# Patient Record
Sex: Female | Born: 1940 | Race: White | Hispanic: No | Marital: Married | Smoking: Never smoker
Health system: Southern US, Community
[De-identification: ages and names within clinical notes are randomized; demographics above are authoritative.]

## PROBLEM LIST (undated history)

## (undated) DIAGNOSIS — K219 Gastro-esophageal reflux disease without esophagitis: Secondary | ICD-10-CM

## (undated) DIAGNOSIS — G4730 Sleep apnea, unspecified: Secondary | ICD-10-CM

## (undated) DIAGNOSIS — E785 Hyperlipidemia, unspecified: Secondary | ICD-10-CM

## (undated) DIAGNOSIS — F419 Anxiety disorder, unspecified: Secondary | ICD-10-CM

## (undated) DIAGNOSIS — D509 Iron deficiency anemia, unspecified: Secondary | ICD-10-CM

## (undated) DIAGNOSIS — K589 Irritable bowel syndrome without diarrhea: Secondary | ICD-10-CM

## (undated) DIAGNOSIS — K5790 Diverticulosis of intestine, part unspecified, without perforation or abscess without bleeding: Secondary | ICD-10-CM

## (undated) DIAGNOSIS — M1990 Unspecified osteoarthritis, unspecified site: Secondary | ICD-10-CM

## (undated) DIAGNOSIS — E119 Type 2 diabetes mellitus without complications: Secondary | ICD-10-CM

## (undated) DIAGNOSIS — Z87440 Personal history of urinary (tract) infections: Secondary | ICD-10-CM

## (undated) DIAGNOSIS — R600 Localized edema: Secondary | ICD-10-CM

## (undated) DIAGNOSIS — K449 Diaphragmatic hernia without obstruction or gangrene: Secondary | ICD-10-CM

## (undated) DIAGNOSIS — K2970 Gastritis, unspecified, without bleeding: Secondary | ICD-10-CM

## (undated) DIAGNOSIS — H269 Unspecified cataract: Secondary | ICD-10-CM

## (undated) DIAGNOSIS — F32A Depression, unspecified: Secondary | ICD-10-CM

## (undated) DIAGNOSIS — M8580 Other specified disorders of bone density and structure, unspecified site: Secondary | ICD-10-CM

## (undated) DIAGNOSIS — T7840XA Allergy, unspecified, initial encounter: Secondary | ICD-10-CM

## (undated) DIAGNOSIS — D126 Benign neoplasm of colon, unspecified: Secondary | ICD-10-CM

## (undated) DIAGNOSIS — Z9289 Personal history of other medical treatment: Secondary | ICD-10-CM

## (undated) DIAGNOSIS — F329 Major depressive disorder, single episode, unspecified: Secondary | ICD-10-CM

## (undated) DIAGNOSIS — G43909 Migraine, unspecified, not intractable, without status migrainosus: Secondary | ICD-10-CM

## (undated) HISTORY — DX: Gastritis, unspecified, without bleeding: K29.70

## (undated) HISTORY — PX: UPPER GI ENDOSCOPY: SHX6162

## (undated) HISTORY — PX: BILATERAL OOPHORECTOMY: SHX1221

## (undated) HISTORY — DX: Unspecified cataract: H26.9

## (undated) HISTORY — PX: TONSILLECTOMY: SUR1361

## (undated) HISTORY — DX: Irritable bowel syndrome, unspecified: K58.9

## (undated) HISTORY — DX: Hyperlipidemia, unspecified: E78.5

## (undated) HISTORY — DX: Diaphragmatic hernia without obstruction or gangrene: K44.9

## (undated) HISTORY — DX: Anxiety disorder, unspecified: F41.9

## (undated) HISTORY — DX: Allergy, unspecified, initial encounter: T78.40XA

## (undated) HISTORY — DX: Unspecified osteoarthritis, unspecified site: M19.90

## (undated) HISTORY — DX: Diverticulosis of intestine, part unspecified, without perforation or abscess without bleeding: K57.90

## (undated) HISTORY — PX: BREAST BIOPSY: SHX20

## (undated) HISTORY — DX: Gastro-esophageal reflux disease without esophagitis: K21.9

## (undated) HISTORY — DX: Benign neoplasm of colon, unspecified: D12.6

## (undated) HISTORY — DX: Sleep apnea, unspecified: G47.30

## (undated) HISTORY — PX: COLONOSCOPY: SHX174

## (undated) HISTORY — PX: PARS PLANA VITRECTOMY W/ REPAIR OF MACULAR HOLE: SHX2170

## (undated) HISTORY — DX: Depression, unspecified: F32.A

## (undated) HISTORY — DX: Major depressive disorder, single episode, unspecified: F32.9

## (undated) HISTORY — DX: Other specified disorders of bone density and structure, unspecified site: M85.80

## (undated) HISTORY — PX: CATARACT EXTRACTION W/ INTRAOCULAR LENS  IMPLANT, BILATERAL: SHX1307

## (undated) HISTORY — PX: CHOLECYSTECTOMY: SHX55

## (undated) HISTORY — PX: BREAST EXCISIONAL BIOPSY: SUR124

## (undated) HISTORY — PX: ABDOMINAL HYSTERECTOMY: SHX81

## (undated) HISTORY — PX: GANGLION CYST EXCISION: SHX1691

---

## 1967-08-27 DIAGNOSIS — Z9289 Personal history of other medical treatment: Secondary | ICD-10-CM

## 1967-08-27 HISTORY — DX: Personal history of other medical treatment: Z92.89

## 1967-08-27 HISTORY — PX: SPLENECTOMY: SUR1306

## 1967-08-27 HISTORY — PX: ABDOMINAL EXPLORATION SURGERY: SHX538

## 1990-08-26 DIAGNOSIS — D126 Benign neoplasm of colon, unspecified: Secondary | ICD-10-CM

## 1990-08-26 HISTORY — DX: Benign neoplasm of colon, unspecified: D12.6

## 1994-01-10 ENCOUNTER — Encounter: Payer: Self-pay | Admitting: Gastroenterology

## 1998-08-09 ENCOUNTER — Other Ambulatory Visit: Admission: RE | Admit: 1998-08-09 | Discharge: 1998-08-09 | Payer: Self-pay | Admitting: Gynecology

## 1999-01-02 ENCOUNTER — Encounter: Payer: Self-pay | Admitting: Internal Medicine

## 1999-01-02 ENCOUNTER — Ambulatory Visit (HOSPITAL_COMMUNITY): Admission: RE | Admit: 1999-01-02 | Discharge: 1999-01-02 | Payer: Self-pay | Admitting: Internal Medicine

## 1999-08-09 ENCOUNTER — Other Ambulatory Visit: Admission: RE | Admit: 1999-08-09 | Discharge: 1999-08-09 | Payer: Self-pay | Admitting: Gynecology

## 2000-02-20 ENCOUNTER — Emergency Department (HOSPITAL_COMMUNITY): Admission: EM | Admit: 2000-02-20 | Discharge: 2000-02-20 | Payer: Self-pay | Admitting: Internal Medicine

## 2000-02-20 ENCOUNTER — Encounter: Payer: Self-pay | Admitting: Internal Medicine

## 2000-02-22 ENCOUNTER — Encounter: Payer: Self-pay | Admitting: Internal Medicine

## 2000-02-22 ENCOUNTER — Ambulatory Visit (HOSPITAL_COMMUNITY): Admission: RE | Admit: 2000-02-22 | Discharge: 2000-02-22 | Payer: Self-pay | Admitting: Internal Medicine

## 2000-03-04 ENCOUNTER — Encounter: Payer: Self-pay | Admitting: Internal Medicine

## 2000-03-04 ENCOUNTER — Emergency Department (HOSPITAL_COMMUNITY): Admission: EM | Admit: 2000-03-04 | Discharge: 2000-03-04 | Payer: Self-pay | Admitting: *Deleted

## 2000-03-10 ENCOUNTER — Encounter: Admission: RE | Admit: 2000-03-10 | Discharge: 2000-06-08 | Payer: Self-pay | Admitting: Internal Medicine

## 2000-08-06 ENCOUNTER — Other Ambulatory Visit: Admission: RE | Admit: 2000-08-06 | Discharge: 2000-08-06 | Payer: Self-pay | Admitting: Gynecology

## 2001-08-11 ENCOUNTER — Other Ambulatory Visit: Admission: RE | Admit: 2001-08-11 | Discharge: 2001-08-11 | Payer: Self-pay | Admitting: Gynecology

## 2001-08-13 ENCOUNTER — Encounter: Admission: RE | Admit: 2001-08-13 | Discharge: 2001-08-13 | Payer: Self-pay | Admitting: Gynecology

## 2001-08-13 ENCOUNTER — Encounter: Payer: Self-pay | Admitting: Gynecology

## 2002-03-30 ENCOUNTER — Encounter (INDEPENDENT_AMBULATORY_CARE_PROVIDER_SITE_OTHER): Payer: Self-pay | Admitting: Gastroenterology

## 2002-09-14 ENCOUNTER — Encounter: Admission: RE | Admit: 2002-09-14 | Discharge: 2002-09-14 | Payer: Self-pay | Admitting: Gynecology

## 2002-09-14 ENCOUNTER — Encounter: Payer: Self-pay | Admitting: Gynecology

## 2002-10-04 ENCOUNTER — Other Ambulatory Visit: Admission: RE | Admit: 2002-10-04 | Discharge: 2002-10-04 | Payer: Self-pay | Admitting: Gynecology

## 2002-12-30 ENCOUNTER — Encounter: Payer: Self-pay | Admitting: Gastroenterology

## 2002-12-30 ENCOUNTER — Ambulatory Visit (HOSPITAL_COMMUNITY): Admission: RE | Admit: 2002-12-30 | Discharge: 2002-12-30 | Payer: Self-pay | Admitting: Gastroenterology

## 2003-02-15 ENCOUNTER — Encounter: Payer: Self-pay | Admitting: Gastroenterology

## 2003-02-15 ENCOUNTER — Ambulatory Visit (HOSPITAL_COMMUNITY): Admission: RE | Admit: 2003-02-15 | Discharge: 2003-02-15 | Payer: Self-pay | Admitting: Gastroenterology

## 2003-06-01 ENCOUNTER — Encounter: Payer: Self-pay | Admitting: Gastroenterology

## 2003-06-29 ENCOUNTER — Encounter (INDEPENDENT_AMBULATORY_CARE_PROVIDER_SITE_OTHER): Payer: Self-pay | Admitting: Gastroenterology

## 2003-06-29 ENCOUNTER — Encounter: Payer: Self-pay | Admitting: Internal Medicine

## 2003-10-14 ENCOUNTER — Encounter: Admission: RE | Admit: 2003-10-14 | Discharge: 2003-10-14 | Payer: Self-pay | Admitting: Gynecology

## 2003-10-27 ENCOUNTER — Other Ambulatory Visit: Admission: RE | Admit: 2003-10-27 | Discharge: 2003-10-27 | Payer: Self-pay | Admitting: Gynecology

## 2004-09-10 ENCOUNTER — Ambulatory Visit: Payer: Self-pay | Admitting: Internal Medicine

## 2004-09-13 ENCOUNTER — Ambulatory Visit: Payer: Self-pay | Admitting: Internal Medicine

## 2004-10-25 ENCOUNTER — Encounter: Admission: RE | Admit: 2004-10-25 | Discharge: 2004-10-25 | Payer: Self-pay | Admitting: Gynecology

## 2004-11-07 ENCOUNTER — Other Ambulatory Visit: Admission: RE | Admit: 2004-11-07 | Discharge: 2004-11-07 | Payer: Self-pay | Admitting: Gynecology

## 2004-12-19 ENCOUNTER — Ambulatory Visit: Payer: Self-pay | Admitting: Internal Medicine

## 2004-12-25 ENCOUNTER — Ambulatory Visit: Payer: Self-pay | Admitting: Internal Medicine

## 2005-05-22 ENCOUNTER — Ambulatory Visit: Payer: Self-pay | Admitting: Internal Medicine

## 2005-05-27 ENCOUNTER — Ambulatory Visit: Payer: Self-pay | Admitting: Internal Medicine

## 2005-06-13 ENCOUNTER — Ambulatory Visit: Payer: Self-pay | Admitting: Internal Medicine

## 2005-07-31 ENCOUNTER — Ambulatory Visit: Payer: Self-pay | Admitting: Gastroenterology

## 2005-08-29 ENCOUNTER — Ambulatory Visit: Payer: Self-pay | Admitting: Internal Medicine

## 2005-09-03 ENCOUNTER — Ambulatory Visit: Payer: Self-pay | Admitting: Internal Medicine

## 2005-10-30 ENCOUNTER — Encounter: Admission: RE | Admit: 2005-10-30 | Discharge: 2005-10-30 | Payer: Self-pay | Admitting: Gynecology

## 2005-11-11 ENCOUNTER — Other Ambulatory Visit: Admission: RE | Admit: 2005-11-11 | Discharge: 2005-11-11 | Payer: Self-pay | Admitting: Gynecology

## 2005-11-13 ENCOUNTER — Ambulatory Visit: Payer: Self-pay | Admitting: Internal Medicine

## 2005-11-15 ENCOUNTER — Ambulatory Visit: Payer: Self-pay | Admitting: Internal Medicine

## 2005-11-27 ENCOUNTER — Encounter: Admission: RE | Admit: 2005-11-27 | Discharge: 2005-11-27 | Payer: Self-pay | Admitting: Gynecology

## 2005-12-04 ENCOUNTER — Ambulatory Visit: Payer: Self-pay | Admitting: Internal Medicine

## 2005-12-12 ENCOUNTER — Ambulatory Visit: Payer: Self-pay | Admitting: Hematology & Oncology

## 2005-12-13 ENCOUNTER — Ambulatory Visit: Payer: Self-pay | Admitting: Internal Medicine

## 2005-12-23 LAB — CBC & DIFF AND RETIC
Basophils Absolute: 0.1 10*3/uL (ref 0.0–0.1)
Eosinophils Absolute: 0.5 10*3/uL (ref 0.0–0.5)
HGB: 10.8 g/dL — ABNORMAL LOW (ref 11.6–15.9)
IRF: 0.29 (ref 0.130–0.330)
MCV: 86.1 fL (ref 81.0–101.0)
MONO#: 0.6 10*3/uL (ref 0.1–0.9)
MONO%: 8 % (ref 0.0–13.0)
NEUT#: 4.4 10*3/uL (ref 1.5–6.5)
Platelets: 625 10*3/uL — ABNORMAL HIGH (ref 145–400)
RBC: 3.83 10*6/uL (ref 3.70–5.32)
RDW: 17.5 % — ABNORMAL HIGH (ref 11.3–14.5)
RETIC #: 54.4 10*3/uL (ref 19.7–115.1)
Retic %: 1.4 % (ref 0.4–2.3)
WBC: 7.9 10*3/uL (ref 3.9–10.0)

## 2005-12-23 LAB — CHCC SMEAR

## 2005-12-23 LAB — ERYTHROCYTE SEDIMENTATION RATE: Sed Rate: 20 mm/hr (ref 0–30)

## 2005-12-24 LAB — ERYTHROPOIETIN: Erythropoietin: 48.9 m[IU]/mL — ABNORMAL HIGH (ref 2.6–34.0)

## 2005-12-25 ENCOUNTER — Ambulatory Visit: Payer: Self-pay | Admitting: Gastroenterology

## 2005-12-27 LAB — JAK2 GENOTYPR

## 2006-01-16 ENCOUNTER — Ambulatory Visit: Payer: Self-pay | Admitting: Internal Medicine

## 2006-01-30 ENCOUNTER — Ambulatory Visit: Payer: Self-pay | Admitting: Hematology & Oncology

## 2006-02-07 LAB — CBC & DIFF AND RETIC
Basophils Absolute: 0.1 10*3/uL (ref 0.0–0.1)
Eosinophils Absolute: 0.3 10*3/uL (ref 0.0–0.5)
HGB: 12.3 g/dL (ref 11.6–15.9)
IRF: 0.19 (ref 0.130–0.330)
MCV: 89.6 fL (ref 81.0–101.0)
MONO#: 0.8 10*3/uL (ref 0.1–0.9)
NEUT#: 5.2 10*3/uL (ref 1.5–6.5)
RDW: 23.4 % — ABNORMAL HIGH (ref 11.3–14.5)
RETIC #: 68.2 10*3/uL (ref 19.7–115.1)
lymph#: 3.1 10*3/uL (ref 0.9–3.3)

## 2006-02-07 LAB — CHCC SMEAR

## 2006-02-10 LAB — FERRITIN: Ferritin: 313 ng/mL — ABNORMAL HIGH (ref 10–291)

## 2006-02-10 LAB — ERYTHROPOIETIN: Erythropoietin: 10.1 m[IU]/mL (ref 2.6–34.0)

## 2006-04-17 ENCOUNTER — Ambulatory Visit: Payer: Self-pay | Admitting: Internal Medicine

## 2006-04-23 ENCOUNTER — Ambulatory Visit: Payer: Self-pay | Admitting: Internal Medicine

## 2006-05-07 ENCOUNTER — Ambulatory Visit: Payer: Self-pay | Admitting: Hematology & Oncology

## 2006-05-09 ENCOUNTER — Ambulatory Visit: Payer: Self-pay | Admitting: Internal Medicine

## 2006-05-09 LAB — CBC WITH DIFFERENTIAL/PLATELET
Eosinophils Absolute: 0.4 10*3/uL (ref 0.0–0.5)
LYMPH%: 25.5 % (ref 14.0–48.0)
MONO#: 0.8 10*3/uL (ref 0.1–0.9)
NEUT#: 5.6 10*3/uL (ref 1.5–6.5)
Platelets: 453 10*3/uL — ABNORMAL HIGH (ref 145–400)
RBC: 4 10*6/uL (ref 3.70–5.32)
WBC: 9.2 10*3/uL (ref 3.9–10.0)

## 2006-05-09 LAB — CHCC SMEAR

## 2006-05-12 LAB — FERRITIN: Ferritin: 204 ng/mL (ref 10–291)

## 2006-05-12 LAB — TRANSFERRIN RECEPTOR, SOLUABLE: Transferrin Receptor, Soluble: 2 mg/L (ref 1.9–4.4)

## 2006-05-29 ENCOUNTER — Ambulatory Visit: Payer: Self-pay | Admitting: Gastroenterology

## 2006-05-30 ENCOUNTER — Ambulatory Visit: Payer: Self-pay | Admitting: Internal Medicine

## 2006-06-24 ENCOUNTER — Encounter (INDEPENDENT_AMBULATORY_CARE_PROVIDER_SITE_OTHER): Payer: Self-pay | Admitting: Specialist

## 2006-06-24 ENCOUNTER — Ambulatory Visit: Payer: Self-pay | Admitting: Gastroenterology

## 2006-07-29 ENCOUNTER — Ambulatory Visit: Payer: Self-pay | Admitting: Internal Medicine

## 2006-07-29 LAB — CONVERTED CEMR LAB
AST: 37 units/L (ref 0–37)
BUN: 8 mg/dL (ref 6–23)
Chloride: 100 meq/L (ref 96–112)
Creatinine, Ser: 0.9 mg/dL (ref 0.4–1.2)
GFR calc non Af Amer: 67 mL/min
Glomerular Filtration Rate, Af Am: 81 mL/min/{1.73_m2}
Hgb A1c MFr Bld: 8 % — ABNORMAL HIGH (ref 4.6–6.0)

## 2006-08-05 ENCOUNTER — Ambulatory Visit: Payer: Self-pay | Admitting: Internal Medicine

## 2006-11-03 ENCOUNTER — Encounter: Admission: RE | Admit: 2006-11-03 | Discharge: 2006-11-03 | Payer: Self-pay | Admitting: Gynecology

## 2006-11-07 ENCOUNTER — Ambulatory Visit: Payer: Self-pay | Admitting: Internal Medicine

## 2006-11-07 LAB — CONVERTED CEMR LAB
ALT: 30 units/L (ref 0–40)
AST: 30 units/L (ref 0–37)
Albumin: 3.8 g/dL (ref 3.5–5.2)
Bilirubin, Direct: 0.1 mg/dL (ref 0.0–0.3)
Chloride: 100 meq/L (ref 96–112)
Creatinine, Ser: 0.9 mg/dL (ref 0.4–1.2)
Creatinine,U: 66.9 mg/dL
Eosinophils Relative: 3.7 % (ref 0.0–5.0)
Glucose, Bld: 176 mg/dL — ABNORMAL HIGH (ref 70–99)
HCT: 38.4 % (ref 36.0–46.0)
Hgb A1c MFr Bld: 8.6 % — ABNORMAL HIGH (ref 4.6–6.0)
Neutrophils Relative %: 55.8 % (ref 43.0–77.0)
RBC: 4 M/uL (ref 3.87–5.11)
RDW: 12.8 % (ref 11.5–14.6)
Sodium: 138 meq/L (ref 135–145)
Total Bilirubin: 0.7 mg/dL (ref 0.3–1.2)
Transferrin: 225.5 mg/dL (ref >=212.0)
Vit D, 1,25-Dihydroxy: 42 (ref 20–57)
WBC: 9.4 10*3/uL (ref 4.5–10.5)

## 2006-11-12 ENCOUNTER — Ambulatory Visit: Payer: Self-pay | Admitting: Internal Medicine

## 2006-11-20 ENCOUNTER — Other Ambulatory Visit: Admission: RE | Admit: 2006-11-20 | Discharge: 2006-11-20 | Payer: Self-pay | Admitting: Gynecology

## 2006-12-29 ENCOUNTER — Ambulatory Visit: Payer: Self-pay | Admitting: Gastroenterology

## 2007-02-23 ENCOUNTER — Ambulatory Visit: Payer: Self-pay | Admitting: Internal Medicine

## 2007-02-23 LAB — CONVERTED CEMR LAB
Albumin: 3.7 g/dL (ref 3.5–5.2)
Alkaline Phosphatase: 80 units/L (ref 39–117)
BUN: 7 mg/dL (ref 6–23)
Creatinine, Ser: 0.8 mg/dL (ref 0.4–1.2)
GFR calc Af Amer: 93 mL/min
GFR calc non Af Amer: 77 mL/min
Potassium: 4 meq/L (ref 3.5–5.1)
Total Bilirubin: 0.6 mg/dL (ref 0.3–1.2)
Vit D, 1,25-Dihydroxy: 51 (ref 20–57)

## 2007-03-02 ENCOUNTER — Ambulatory Visit: Payer: Self-pay | Admitting: Internal Medicine

## 2007-05-25 ENCOUNTER — Ambulatory Visit: Payer: Self-pay | Admitting: Internal Medicine

## 2007-06-24 ENCOUNTER — Ambulatory Visit: Payer: Self-pay | Admitting: Internal Medicine

## 2007-06-24 LAB — CONVERTED CEMR LAB
BUN: 7 mg/dL (ref 6–23)
Calcium: 9.4 mg/dL (ref 8.4–10.5)
Chloride: 98 meq/L (ref 96–112)
Creatinine, Ser: 0.7 mg/dL (ref 0.4–1.2)
GFR calc non Af Amer: 89 mL/min
Hgb A1c MFr Bld: 7.7 % — ABNORMAL HIGH (ref 4.6–6.0)

## 2007-06-26 ENCOUNTER — Encounter: Payer: Self-pay | Admitting: Internal Medicine

## 2007-06-26 ENCOUNTER — Ambulatory Visit: Payer: Self-pay | Admitting: Internal Medicine

## 2007-06-26 DIAGNOSIS — F32A Depression, unspecified: Secondary | ICD-10-CM | POA: Insufficient documentation

## 2007-06-26 DIAGNOSIS — I119 Hypertensive heart disease without heart failure: Secondary | ICD-10-CM | POA: Insufficient documentation

## 2007-06-26 DIAGNOSIS — K219 Gastro-esophageal reflux disease without esophagitis: Secondary | ICD-10-CM | POA: Insufficient documentation

## 2007-06-26 DIAGNOSIS — E118 Type 2 diabetes mellitus with unspecified complications: Secondary | ICD-10-CM | POA: Insufficient documentation

## 2007-06-26 DIAGNOSIS — F411 Generalized anxiety disorder: Secondary | ICD-10-CM | POA: Insufficient documentation

## 2007-06-26 DIAGNOSIS — Z860100 Personal history of colon polyps, unspecified: Secondary | ICD-10-CM | POA: Insufficient documentation

## 2007-06-26 DIAGNOSIS — Z9089 Acquired absence of other organs: Secondary | ICD-10-CM | POA: Insufficient documentation

## 2007-06-26 DIAGNOSIS — M1990 Unspecified osteoarthritis, unspecified site: Secondary | ICD-10-CM | POA: Insufficient documentation

## 2007-06-26 DIAGNOSIS — F329 Major depressive disorder, single episode, unspecified: Secondary | ICD-10-CM

## 2007-06-26 DIAGNOSIS — F418 Other specified anxiety disorders: Secondary | ICD-10-CM | POA: Insufficient documentation

## 2007-09-22 ENCOUNTER — Ambulatory Visit: Payer: Self-pay | Admitting: Internal Medicine

## 2007-09-23 LAB — CONVERTED CEMR LAB
AST: 42 units/L — ABNORMAL HIGH (ref 0–37)
Albumin: 4 g/dL (ref 3.5–5.2)
Alkaline Phosphatase: 87 units/L (ref 39–117)
Bilirubin Urine: NEGATIVE
CO2: 30 meq/L (ref 19–32)
Chloride: 97 meq/L (ref 96–112)
Cholesterol: 127 mg/dL (ref 0–200)
Creatinine, Ser: 0.8 mg/dL (ref 0.4–1.2)
HDL: 56.3 mg/dL (ref >39.0)
Hgb A1c MFr Bld: 8.1 % — ABNORMAL HIGH (ref 4.6–6.0)
Leukocytes, UA: NEGATIVE
Potassium: 4.1 meq/L (ref 3.5–5.1)
Sodium: 136 meq/L (ref 135–145)
Total Bilirubin: 0.6 mg/dL (ref 0.3–1.2)
Total CHOL/HDL Ratio: 2.3
Total Protein: 7.1 g/dL (ref 6.0–8.3)
Triglycerides: 144 mg/dL (ref 0–149)
Urine Glucose: NEGATIVE mg/dL

## 2007-09-24 ENCOUNTER — Ambulatory Visit: Payer: Self-pay | Admitting: Internal Medicine

## 2007-09-24 DIAGNOSIS — R7989 Other specified abnormal findings of blood chemistry: Secondary | ICD-10-CM | POA: Insufficient documentation

## 2007-09-24 DIAGNOSIS — K589 Irritable bowel syndrome without diarrhea: Secondary | ICD-10-CM | POA: Insufficient documentation

## 2007-09-24 DIAGNOSIS — R945 Abnormal results of liver function studies: Secondary | ICD-10-CM

## 2007-09-29 ENCOUNTER — Encounter: Admission: RE | Admit: 2007-09-29 | Discharge: 2007-09-29 | Payer: Self-pay | Admitting: Internal Medicine

## 2007-10-21 ENCOUNTER — Telehealth: Payer: Self-pay | Admitting: Internal Medicine

## 2007-11-12 ENCOUNTER — Encounter: Admission: RE | Admit: 2007-11-12 | Discharge: 2007-11-12 | Payer: Self-pay | Admitting: Gynecology

## 2007-12-01 ENCOUNTER — Encounter: Payer: Self-pay | Admitting: Internal Medicine

## 2007-12-16 ENCOUNTER — Ambulatory Visit: Payer: Self-pay | Admitting: Internal Medicine

## 2007-12-16 LAB — CONVERTED CEMR LAB
ALT: 47 units/L — ABNORMAL HIGH (ref 0–35)
Albumin: 3.9 g/dL (ref 3.5–5.2)
Bilirubin, Direct: 0.1 mg/dL (ref 0.0–0.3)
CO2: 28 meq/L (ref 19–32)
Calcium: 9.5 mg/dL (ref 8.4–10.5)
Creatinine, Ser: 0.9 mg/dL (ref 0.4–1.2)
GFR calc Af Amer: 81 mL/min
Glucose, Bld: 185 mg/dL — ABNORMAL HIGH (ref 70–99)
Hgb A1c MFr Bld: 8.2 % — ABNORMAL HIGH (ref 4.6–6.0)
Sodium: 135 meq/L (ref 135–145)
Total Protein: 7.2 g/dL (ref 6.0–8.3)

## 2007-12-23 ENCOUNTER — Ambulatory Visit: Payer: Self-pay | Admitting: Internal Medicine

## 2008-04-26 ENCOUNTER — Ambulatory Visit: Payer: Self-pay | Admitting: Internal Medicine

## 2008-09-05 ENCOUNTER — Ambulatory Visit: Payer: Self-pay | Admitting: Internal Medicine

## 2008-11-15 ENCOUNTER — Encounter: Admission: RE | Admit: 2008-11-15 | Discharge: 2008-11-15 | Payer: Self-pay | Admitting: Gynecology

## 2008-11-22 ENCOUNTER — Telehealth: Payer: Self-pay | Admitting: Internal Medicine

## 2009-03-07 ENCOUNTER — Ambulatory Visit: Payer: Self-pay | Admitting: Internal Medicine

## 2009-06-07 ENCOUNTER — Ambulatory Visit: Payer: Self-pay | Admitting: Internal Medicine

## 2009-07-12 ENCOUNTER — Ambulatory Visit: Payer: Self-pay | Admitting: Internal Medicine

## 2009-07-13 ENCOUNTER — Ambulatory Visit: Payer: Self-pay | Admitting: Gastroenterology

## 2009-07-13 DIAGNOSIS — R1319 Other dysphagia: Secondary | ICD-10-CM | POA: Insufficient documentation

## 2009-08-07 ENCOUNTER — Encounter: Payer: Self-pay | Admitting: Internal Medicine

## 2009-09-14 ENCOUNTER — Encounter: Admission: RE | Admit: 2009-09-14 | Discharge: 2009-10-11 | Payer: Self-pay | Admitting: Sports Medicine

## 2009-11-20 ENCOUNTER — Encounter: Admission: RE | Admit: 2009-11-20 | Discharge: 2009-11-20 | Payer: Self-pay | Admitting: Gynecology

## 2009-11-22 ENCOUNTER — Encounter: Payer: Self-pay | Admitting: Internal Medicine

## 2009-11-30 ENCOUNTER — Telehealth: Payer: Self-pay | Admitting: Internal Medicine

## 2009-12-13 ENCOUNTER — Ambulatory Visit: Payer: Self-pay | Admitting: Internal Medicine

## 2010-02-21 ENCOUNTER — Encounter: Payer: Self-pay | Admitting: Internal Medicine

## 2010-04-02 ENCOUNTER — Telehealth: Payer: Self-pay | Admitting: Internal Medicine

## 2010-04-18 ENCOUNTER — Ambulatory Visit: Payer: Self-pay | Admitting: Internal Medicine

## 2010-05-30 ENCOUNTER — Encounter: Payer: Self-pay | Admitting: Internal Medicine

## 2010-08-07 ENCOUNTER — Ambulatory Visit: Payer: Self-pay | Admitting: Internal Medicine

## 2010-08-07 DIAGNOSIS — R209 Unspecified disturbances of skin sensation: Secondary | ICD-10-CM | POA: Insufficient documentation

## 2010-08-07 DIAGNOSIS — R5383 Other fatigue: Secondary | ICD-10-CM | POA: Insufficient documentation

## 2010-08-07 DIAGNOSIS — N390 Urinary tract infection, site not specified: Secondary | ICD-10-CM | POA: Insufficient documentation

## 2010-08-08 ENCOUNTER — Telehealth: Payer: Self-pay | Admitting: Internal Medicine

## 2010-08-08 LAB — CONVERTED CEMR LAB
ALT: 29 units/L (ref 0–35)
AST: 33 units/L (ref 0–37)
Albumin: 4.3 g/dL (ref 3.5–5.2)
Alkaline Phosphatase: 58 units/L (ref 39–117)
BUN: 11 mg/dL (ref 6–23)
Basophils Absolute: 0.1 10*3/uL (ref 0.0–0.1)
Basophils Relative: 0.6 % (ref 0.0–3.0)
Bilirubin Urine: NEGATIVE
Bilirubin, Direct: 0.1 mg/dL (ref 0.0–0.3)
CO2: 29 meq/L (ref 19–32)
Calcium: 9.7 mg/dL (ref 8.4–10.5)
Chloride: 96 meq/L (ref 96–112)
Cholesterol: 116 mg/dL (ref 0–200)
Creatinine, Ser: 0.8 mg/dL (ref 0.4–1.2)
Eosinophils Absolute: 0.2 10*3/uL (ref 0.0–0.7)
Eosinophils Relative: 1.7 % (ref 0.0–5.0)
GFR calc non Af Amer: 73.47 mL/min (ref >60.00)
Glucose, Bld: 135 mg/dL — ABNORMAL HIGH (ref 70–99)
HCT: 40 % (ref 36.0–46.0)
HDL: 50.4 mg/dL (ref >39.00)
Hemoglobin: 13.6 g/dL (ref 12.0–15.0)
Hgb A1c MFr Bld: 7.3 % — ABNORMAL HIGH (ref 4.6–6.5)
Ketones, ur: NEGATIVE mg/dL
LDL Cholesterol: 48 mg/dL (ref 0–99)
Lymphocytes Relative: 24.2 % (ref 12.0–46.0)
Lymphs Abs: 2.7 10*3/uL (ref 0.7–4.0)
MCHC: 33.9 g/dL (ref 30.0–36.0)
MCV: 97.8 fL (ref 78.0–100.0)
Monocytes Absolute: 0.8 10*3/uL (ref 0.1–1.0)
Monocytes Relative: 7.1 % (ref 3.0–12.0)
Neutro Abs: 7.5 10*3/uL (ref 1.4–7.7)
Neutrophils Relative %: 66.4 % (ref 43.0–77.0)
Nitrite: NEGATIVE
Platelets: 479 10*3/uL — ABNORMAL HIGH (ref 150.0–400.0)
Potassium: 4.4 meq/L (ref 3.5–5.1)
RBC: 4.09 M/uL (ref 3.87–5.11)
RDW: 14 % (ref 11.5–14.6)
Sed Rate: 10 mm/hr (ref 0–22)
Sodium: 135 meq/L (ref 135–145)
Specific Gravity, Urine: <=1.005 (ref 1.000–1.030)
TSH: 1.26 microintl units/mL (ref 0.35–5.50)
Total Bilirubin: 0.5 mg/dL (ref 0.3–1.2)
Total CHOL/HDL Ratio: 2
Total Protein, Urine: NEGATIVE mg/dL
Total Protein: 7.3 g/dL (ref 6.0–8.3)
Triglycerides: 86 mg/dL (ref 0.0–149.0)
Urine Glucose: NEGATIVE mg/dL
Urobilinogen, UA: 0.2 (ref 0.0–1.0)
VLDL: 17.2 mg/dL (ref 0.0–40.0)
Vitamin B-12: 334 pg/mL (ref 211–911)
WBC: 11.3 10*3/uL — ABNORMAL HIGH (ref 4.5–10.5)
pH: 6 (ref 5.0–8.0)

## 2010-09-12 ENCOUNTER — Encounter: Payer: Self-pay | Admitting: Internal Medicine

## 2010-09-25 NOTE — Assessment & Plan Note (Signed)
Summary: 4 MO ROV /NWS  #   Vital Signs:  Patient profile:   70 year old female Height:      63 inches Weight:      178 pounds BMI:     31.65 O2 Sat:      94 % on Room air Temp:     96.9 degrees F oral Pulse rate:   95 / minute Pulse rhythm:   regular Resp:     16 per minute BP sitting:   118 / 70  (left arm) Cuff size:   regular  Vitals Entered By: Lanier Prude, CMA(AAMA) (April 18, 2010 1:37 PM)  O2 Flow:  Room air CC: 4 mo f/u Is Patient Diabetic? No   Primary Care Provider:  Sonda Primes, MD  CC:  4 mo f/u.  History of Present Illness: The patient presents for a follow up of hypertension, diabetes, hyperlipidemia C/o occasional RUQ symptoms when tired or walking  Current Medications (verified): 1)  Lovastatin 20 Mg Tabs (Lovastatin) .Marland Kitchen.. 1 Qd 2)  Glucophage Xr 500 Mg  Tb24 (Metformin Hcl) .... 2 Bid 3)  Wellbutrin Sr 200 Mg Xr12h-Tab (Bupropion Hcl) .... 2 Once Daily 4)  Furosemide 20 Mg Tabs (Furosemide) .Marland Kitchen.. 1 Qam 5)  Lorazepam 0.5 Mg  Tabs (Lorazepam) .Marland Kitchen.. 1 At Bedtime Prn 6)  Vivelle-Dot 0.0375 Mg/24hr  Pttw (Estradiol) .... Twice Weekly To Skin 7)  Zyrtec Allergy 10 Mg  Tabs (Cetirizine Hcl) .Marland Kitchen.. 1 Once Daily Prn 8)  Vitamin D3 1000 Unit  Tabs (Cholecalciferol) .... 2 Tabs Qd 9)  Aspir-Low 81 Mg Tbec (Aspirin) .Marland Kitchen.. 1 Once Daily Pc 10)  Cymbalta 60 Mg Cpep (Duloxetine Hcl) .... Once Daily 11)  Metoclopramide Hcl 10 Mg Tabs (Metoclopramide Hcl) .... Take One By Mouth Once Daily 12)  Actos 45 Mg Tabs (Pioglitazone Hcl) .... Once Daily 13)  Lantus 100 Unit/ml Soln (Insulin Glargine) .... 20 Units Once Daily 14)  Augmentin 500-125 Mg Tabs (Amoxicillin-Pot Clavulanate) .Marland Kitchen.. 1 By Mouth Three Times A Day Prn 15)  Victoza 18 Mg/61ml Soln (Liraglutide) .... 1.8 Once A Day 16)  Align  Caps (Probiotic Product) .... Once Daily 17)  First-Testosterone Mc 2 % Crea (Testosterone Propionate) .... Small Amount Once Daily 18)  Prevacid 30 Mg Cpdr (Lansoprazole) .... One  Tablet By Mouth Once Daily 19)  Zantac 150 Maximum Strength 150 Mg Tabs (Ranitidine Hcl) .... Take One By Mouth As Needed 20)  Daily Multi  Tabs (Multiple Vitamins-Minerals) .Marland Kitchen.. 1 Once Daily 21)  B Complex  Tabs (B Complex Vitamins) .Marland Kitchen.. 1 Once Daily  Allergies (verified): 1)  ! Codeine 2)  ! Pravachol  Past History:  Past Medical History: Last updated: 07/13/2009 Anxiety Depression Diabetes mellitus, type II GERD Hypertension Osteoarthritis Irritable Bowel Syndrome Hyperlipidemia Diverticulosis Hemorrhoids Gastritis Adenomatous Colon Polyps 1992 Chronic Headaches  Past Surgical History: Last updated: 07/13/2009 Cholecystectomy Hysterectomy Oophorectomy Splenectomy Tonsillectomy right wrist cyst removal benign breast bx bilateral  Social History: Last updated: 12/13/2009 Occupation:retired Married with three sons Never Smoked Alcohol Use - no Daily Caffeine Use rare  Illicit Drug Use - no Patient does  get regular [facility] [employer]   Family History: Reviewed history from 07/13/2009 and no changes required. Family History Hypertension Family History of Breast Cancer:sister Family History of Colon Cancer:mother dxed 88 deied 2 Family History of Colon Polyps:mother Family History of Diabetes: father  Social History: Reviewed history from 12/13/2009 and no changes required. Occupation:retired Married with three sons Never Smoked  Alcohol Use - no Daily Caffeine Use rare  Illicit Drug Use - no Patient does  get regular exercise [facility] [employer] [facility] [facility] [employer]   Review of Systems  The patient denies fever, dyspnea on exertion, abdominal pain, and melena.         lost wt on wt watchers  Physical Exam  General:  Well-developed,overweight-appearing.   Ears:  External ear exam shows no significant lesions or deformities.  Otoscopic examination reveals clear canals, tympanic membranes are intact bilaterally without bulging, retraction,  inflammation or discharge. Hearing is grossly normal bilaterally. Nose:  External nasal examination shows no deformity or inflammation. Nasal mucosa are pink and moist without lesions or exudates. Mouth:  Oral mucosa and oropharynx without lesions or exudates.  Teeth in good repair. Neck:  No deformities, masses, or tenderness noted. Lungs:  Normal respiratory effort, chest expands symmetrically. Lungs are clear to auscultation, no crackles or wheezes. Heart:  Normal rate and regular rhythm. S1 and S2 normal without gallop, murmur, click, rub or other extra sounds. Abdomen:  Bowel sounds positive,abdomen soft and non-tender without masses, organomegaly or hernias noted. Msk:  OA 1st MCP B Extremities:  No clubbing, cyanosis, edema, or deformity noted with normal full range of motion of all joints.   Neurologic:  No cranial nerve deficits noted. Station and gait are normal. Plantar reflexes are down-going bilaterally. DTRs are symmetrical throughout. Sensory, motor and coordinative functions appear intact. Skin:  Intact without suspicious lesions or rashes Psych:  Cognition and judgment appear intact. Alert and cooperative with normal attention span and concentration. No apparent delusions, illusions, hallucinations   Impression & Recommendations:  Problem # 1:  HYPERTENSION (ICD-401.9) Assessment Improved  Her updated medication list for this problem includes:    Furosemide 20 Mg Tabs (Furosemide) .Marland Kitchen... 1 qam  Problem # 2:  DIABETES MELLITUS, TYPE II (ICD-250.00) Assessment: Unchanged  Her updated medication list for this problem includes:    Glucophage Xr 500 Mg Tb24 (Metformin hcl) .Marland Kitchen... 2 bid    Aspir-low 81 Mg Tbec (Aspirin) .Marland Kitchen... 1 once daily pc    Actos 45 Mg Tabs (Pioglitazone hcl) ..... Once daily    Lantus 100 Unit/ml Soln (Insulin glargine) .Marland Kitchen... 20 units once daily    Victoza 18 Mg/26ml Soln (Liraglutide) .Marland Kitchen... 1.8 once a day  Problem # 3:  ANXIETY (ICD-300.00) Assessment:  Unchanged  Her updated medication list for this problem includes:    Wellbutrin Sr 200 Mg Xr12h-tab (Bupropion hcl) .Marland Kitchen... 2 once daily    Lorazepam 0.5 Mg Tabs (Lorazepam) .Marland Kitchen... 1 at bedtime prn    Cymbalta 60 Mg Cpep (Duloxetine hcl) ..... Once daily  Problem # 4:  DEPRESSION (ICD-311) Assessment: Unchanged  Her updated medication list for this problem includes:    Wellbutrin Sr 200 Mg Xr12h-tab (Bupropion hcl) .Marland Kitchen... 2 once daily    Lorazepam 0.5 Mg Tabs (Lorazepam) .Marland Kitchen... 1 at bedtime prn    Cymbalta 60 Mg Cpep (Duloxetine hcl) ..... Once daily  Complete Medication List: 1)  Lovastatin 20 Mg Tabs (Lovastatin) .Marland Kitchen.. 1 qd 2)  Glucophage Xr 500 Mg Tb24 (Metformin hcl) .... 2 bid 3)  Wellbutrin Sr 200 Mg Xr12h-tab (Bupropion hcl) .... 2 once daily 4)  Furosemide 20 Mg Tabs (Furosemide) .Marland Kitchen.. 1 qam 5)  Lorazepam 0.5 Mg Tabs (Lorazepam) .Marland Kitchen.. 1 at bedtime prn 6)  Vivelle-dot 0.0375 Mg/24hr Pttw (Estradiol) .... Twice weekly to skin 7)  Zyrtec Allergy 10 Mg Tabs (Cetirizine hcl) .Marland Kitchen.. 1 once daily prn 8)  Vitamin D3 1000 Unit Tabs (Cholecalciferol) .... 2 tabs qd 9)  Aspir-low 81 Mg Tbec (Aspirin) .Marland Kitchen.. 1 once daily pc 10)  Cymbalta 60 Mg Cpep (Duloxetine hcl) .... Once daily 11)  Metoclopramide Hcl 10 Mg Tabs (Metoclopramide hcl) .... Take one by mouth once daily 12)  Actos 45 Mg Tabs (Pioglitazone hcl) .... Once daily 13)  Lantus 100 Unit/ml Soln (Insulin glargine) .... 20 units once daily 14)  Augmentin 500-125 Mg Tabs (Amoxicillin-pot clavulanate) .Marland Kitchen.. 1 by mouth three times a day prn 15)  Victoza 18 Mg/16ml Soln (Liraglutide) .... 1.8 once a day 16)  Align Caps (Probiotic product) .... Once daily 17)  First-testosterone Mc 2 % Crea (Testosterone propionate) .... Small amount once daily 18)  Prevacid 30 Mg Cpdr (Lansoprazole) .... One tablet by mouth once daily 19)  Zantac 150 Maximum Strength 150 Mg Tabs (Ranitidine hcl) .... Take one by mouth as needed 20)  Daily Multi Tabs (Multiple  vitamins-minerals) .Marland Kitchen.. 1 once daily 21)  B Complex Tabs (B complex vitamins) .Marland Kitchen.. 1 once daily  Other Orders: Flu Vaccine 80yrs + ([address]) Administration Flu vaccine - MCR (U0454)  Patient Instructions: 1)  Please schedule a follow-up appointment in 4 months well w/labs and A1c 250.00. Prescriptions: LOVASTATIN 20 MG TABS (LOVASTATIN) 1 qd  #90 x 3   Entered and Authorized by:   Tresa Garter MD   Signed by:   Tresa Garter MD on 04/18/2010   Method used:   Electronically to        [facility]* (mail-order)       [address]       [place], [place]  [address]       Ph: [phone]       Fax: [phone]   RxID:   [id number]     .[email or web address]   Flu Vaccine Consent Questions     Do you have a history of severe allergic reactions to this vaccine? no    Any prior history of allergic reactions to egg and/or gelatin? no    Do you have a sensitivity to the preservative Thimersol? no    Do you have a past history of Guillan-Barre Syndrome? no    Do you currently have an acute febrile illness? no    Have you ever had a severe reaction to latex? no    Vaccine information given and explained to patient? yes    Are you currently pregnant? no    Lot Number:[id number]   Exp Date:02/23/2011   Site Given  Left Deltoid IM Lanier Prude, [facility])  April 18, 2010 3:20 PM

## 2010-09-25 NOTE — Progress Notes (Signed)
Summary: metoclopramide  Phone Note Call from Patient Call back at [facility] Phone [phone]   Summary of Call: patient called for 90 day prescription of Metoclopramide. Initial call taken by: Lucious Groves,  November 30, 2009 4:24 PM    Prescriptions: METOCLOPRAMIDE HCL 10 MG TABS (METOCLOPRAMIDE HCL) take one by mouth once daily  #90 x 3   Entered by:   Lucious Groves   Authorized by:   Tresa Garter MD   Signed by:   Lucious Groves on 11/30/2009   Method used:   Faxed to ...       [facility]* (mail-order)       [address]       [place], [place]  [address]       Ph: [phone]       Fax: [phone]   RxID:   [id number]

## 2010-09-25 NOTE — Progress Notes (Signed)
  Phone Note Refill Request Message from:  Fax from Pharmacy on April 02, 2010 9:01 AM  Refills Requested: Medication #1:  FUROSEMIDE 20 MG TABS 1 qam Initial call taken by: Ami Bullins CMA,  April 02, 2010 9:01 AM    Prescriptions: FUROSEMIDE 20 MG TABS (FUROSEMIDE) 1 qam  #90 x 3   Entered by:   Ami Bullins CMA   Authorized by:   Jacques Navy MD   Signed by:   Bill Salinas CMA on 04/02/2010   Method used:   Electronically to        [facility]* (mail-order)       [address]       [place], [place]  [address]       Ph: [phone]       Fax: [phone]   RxID:   [id number]

## 2010-09-25 NOTE — Letter (Signed)
Summary: [facility]  [facility]   Imported By: Sherian Rein 06/14/2010 11:57:27  _____________________________________________________________________  External Attachment:    Type:   Image     Comment:   External Document

## 2010-09-25 NOTE — Assessment & Plan Note (Signed)
Summary: follow up-lb   Vital Signs:  Patient profile:   70 year old female Height:      63 inches Weight:      189.25 pounds BMI:     33.65 O2 Sat:      95 % on Room air Temp:     98.0 degrees F oral Pulse rate:   99 / minute BP sitting:   140 / 84  (left arm) Cuff size:   regular  Vitals Entered By: Lucious Groves (December 13, 2009 1:33 PM)  O2 Flow:  Room air CC: F/U ov./kb Is Patient Diabetic? Yes Did you bring your meter with you today? No Pain Assessment Patient in pain? no        Primary Care Provider:  Sonda Primes, MD  CC:  F/U ov./kb.  History of Present Illness: The patient presents for a follow up of hypertension, diabetes, hyperlipidemia C/o allergies C/o R groin pain  Current Medications (verified): 1)  Lovastatin 20 Mg Tabs (Lovastatin) .Marland Kitchen.. 1 Qd 2)  Glucophage Xr 500 Mg  Tb24 (Metformin Hcl) .... 2 Bid 3)  Wellbutrin Sr 150 Mg Tb12 (Bupropion Hcl) .... 3 Qam 4)  Furosemide 20 Mg Tabs (Furosemide) .Marland Kitchen.. 1 Qam 5)  Lorazepam 0.5 Mg  Tabs (Lorazepam) .Marland Kitchen.. 1 At Bedtime Prn 6)  Vivelle-Dot 0.0375 Mg/24hr  Pttw (Estradiol) .... Twice Weekly To Skin 7)  Zyrtec Allergy 10 Mg  Tabs (Cetirizine Hcl) .Marland Kitchen.. 1 Once Daily Prn 8)  Vitamin D3 1000 Unit  Tabs (Cholecalciferol) .... 2 Tabs Qd 9)  Aspir-Low 81 Mg Tbec (Aspirin) .Marland Kitchen.. 1 Once Daily Pc 10)  Cymbalta 60 Mg Cpep (Duloxetine Hcl) .... Once Daily 11)  Metoclopramide Hcl 10 Mg Tabs (Metoclopramide Hcl) .... Take One By Mouth Once Daily 12)  Actos 45 Mg Tabs (Pioglitazone Hcl) .... Once Daily 13)  Lantus 100 Unit/ml Soln (Insulin Glargine) .Marland Kitchen.. 18units Once Daily 14)  Augmentin 500-125 Mg Tabs (Amoxicillin-Pot Clavulanate) .Marland Kitchen.. 1 By Mouth Three Times A Day Prn 15)  Victoza 18 Mg/32ml Soln (Liraglutide) .... 1.8 Once A Day 16)  Align  Caps (Probiotic Product) .... Once Daily 17)  First-Testosterone Mc 2 % Crea (Testosterone Propionate) .... Small Amount Once Daily 18)  Prevacid 30 Mg Cpdr (Lansoprazole) .... One  Tablet By Mouth Once Daily 19)  Zantac 150 Maximum Strength 150 Mg Tabs (Ranitidine Hcl) .... Take One By Mouth As Needed  Allergies (verified): 1)  ! Codeine 2)  ! Pravachol  Past History:  Past Medical History: Last updated: 07/13/2009 Anxiety Depression Diabetes mellitus, type II GERD Hypertension Osteoarthritis Irritable Bowel Syndrome Hyperlipidemia Diverticulosis Hemorrhoids Gastritis Adenomatous Colon Polyps 1992 Chronic Headaches  Social History: Last updated: 12/13/2009 Occupation:retired Married with three sons Never Smoked Alcohol Use - no Daily Caffeine Use rare  Illicit Drug Use - no Patient does  get regular exercise [facility] [employer] [facility] [employer]   Social History: Occupation:retired Married with three sons Never Smoked Alcohol Use - no Daily Caffeine Use rare  Illicit Drug Use - no Patient does  get regular exercise - [employer] [facility] [employer]   Review of Systems  The patient denies fever, dyspnea on exertion, and abdominal pain.    Physical Exam  General:  Well-developed,well-nourished,in no acute distress; alert,appropriate and cooperative throughout examination Eyes:  No corneal or conjunctival inflammation noted. EOMI. Perrla. Funduscopic exam benign, without hemorrhages, exudates or papilledema. Vision grossly normal. Nose:  External nasal examination shows no deformity or inflammation. Nasal mucosa are pink and  moist without lesions or exudates. Mouth:  Oral mucosa and oropharynx without lesions or exudates.  Teeth in good repair. Neck:  No deformities, masses, or tenderness noted. Lungs:  Normal respiratory effort, chest expands symmetrically. Lungs are clear to auscultation, no crackles or wheezes. Heart:  Normal rate and regular rhythm. S1 and S2 normal without gallop, murmur, click, rub or other extra sounds. Abdomen:  Bowel sounds positive,abdomen soft and non-tender without masses, organomegaly or hernias noted. Msk:  OA 1st MCP  B Extremities:  No clubbing, cyanosis, edema, or deformity noted with normal full range of motion of all joints.   Neurologic:  No cranial nerve deficits noted. Station and gait are normal. Plantar reflexes are down-going bilaterally. DTRs are symmetrical throughout. Sensory, motor and coordinative functions appear intact. Skin:  Intact without suspicious lesions or rashes Psych:  Cognition and judgment appear intact. Alert and cooperative with normal attention span and concentration. No apparent delusions, illusions, hallucinations   Impression & Recommendations:  Problem # 1:  HYPERTENSION (ICD-401.9) Assessment Unchanged  Her updated medication list for this problem includes:    Furosemide 20 Mg Tabs (Furosemide) .Marland Kitchen... 1 qam  Problem # 2:  OSTEOARTHRITIS (ICD-715.90) Assessment: Unchanged  Her updated medication list for this problem includes:    Aspir-low 81 Mg Tbec (Aspirin) .Marland Kitchen... 1 once daily pc  Problem # 3:  DIABETES MELLITUS, TYPE II (ICD-250.00) Assessment: Unchanged  Her updated medication list for this problem includes:    Glucophage Xr 500 Mg Tb24 (Metformin hcl) .Marland Kitchen... 2 bid    Aspir-low 81 Mg Tbec (Aspirin) .Marland Kitchen... 1 once daily pc    Actos 45 Mg Tabs (Pioglitazone hcl) ..... Once daily    Lantus 100 Unit/ml Soln (Insulin glargine) .Marland Kitchen... 20 units once daily    Victoza 18 Mg/68ml Soln (Liraglutide) .Marland Kitchen... 1.8 once a day  Problem # 4:  OSTEOARTHRITIS (ICD-715.90) Assessment: Unchanged  Her updated medication list for this problem includes:    Aspir-low 81 Mg Tbec (Aspirin) .Marland Kitchen... 1 once daily pc  Complete Medication List: 1)  Lovastatin 20 Mg Tabs (Lovastatin) .Marland Kitchen.. 1 qd 2)  Glucophage Xr 500 Mg Tb24 (Metformin hcl) .... 2 bid 3)  Wellbutrin Sr 150 Mg Tb12 (Bupropion hcl) .... 3 qam 4)  Furosemide 20 Mg Tabs (Furosemide) .Marland Kitchen.. 1 qam 5)  Lorazepam 0.5 Mg Tabs (Lorazepam) .Marland Kitchen.. 1 at bedtime prn 6)  Vivelle-dot 0.0375 Mg/24hr Pttw (Estradiol) .... Twice weekly to skin 7)   Zyrtec Allergy 10 Mg Tabs (Cetirizine hcl) .Marland Kitchen.. 1 once daily prn 8)  Vitamin D3 1000 Unit Tabs (Cholecalciferol) .... 2 tabs qd 9)  Aspir-low 81 Mg Tbec (Aspirin) .Marland Kitchen.. 1 once daily pc 10)  Cymbalta 60 Mg Cpep (Duloxetine hcl) .... Once daily 11)  Metoclopramide Hcl 10 Mg Tabs (Metoclopramide hcl) .... Take one by mouth once daily 12)  Actos 45 Mg Tabs (Pioglitazone hcl) .... Once daily 13)  Lantus 100 Unit/ml Soln (Insulin glargine) .... 20 units once daily 14)  Augmentin 500-125 Mg Tabs (Amoxicillin-pot clavulanate) .Marland Kitchen.. 1 by mouth three times a day prn 15)  Victoza 18 Mg/56ml Soln (Liraglutide) .... 1.8 once a day 16)  Align Caps (Probiotic product) .... Once daily 17)  First-testosterone Mc 2 % Crea (Testosterone propionate) .... Small amount once daily 18)  Prevacid 30 Mg Cpdr (Lansoprazole) .... One tablet by mouth once daily 19)  Zantac 150 Maximum Strength 150 Mg Tabs (Ranitidine hcl) .... Take one by mouth as needed  Patient Instructions: 1)  Please schedule a follow-up  appointment in 4 months. 2)  BMP prior to visit, ICD-9: 3)  HbgA1C prior to visit, ICD-9: 250.00 4)  Use the Sinus rinse as needed

## 2010-09-25 NOTE — Letter (Signed)
Summary: [facility]  [facility]   Imported By: Sherian Rein 03/13/2010 10:47:41  _____________________________________________________________________  External Attachment:    Type:   Image     Comment:   External Document

## 2010-09-27 NOTE — Progress Notes (Signed)
Summary: Cipro ?  Phone Note Outgoing Call   Call placed by: Lanier Prude, [facility]),  August 08, 2010 4:57 PM Summary of Call: called pt to inform of results. You wrote Cipro 500 mg po bid x 5 days (#20, no refills). Please advise on quantity and days.  Follow-up for Phone Call        Sorry: see RX Thank you!  Follow-up by: Tresa Garter MD,  August 08, 2010 5:35 PM    New/Updated Medications: CIPROFLOXACIN HCL 500 MG TABS (CIPROFLOXACIN HCL) 1 by mouth bid Prescriptions: CIPROFLOXACIN HCL 500 MG TABS (CIPROFLOXACIN HCL) 1 by mouth bid  #14 x 0   Entered and Authorized by:   Tresa Garter MD   Signed by:   Lamar Sprinkles, CMA on 08/08/2010   Method used:   Electronically to        [facility]* (retail)       [address]       [place], [place]  [address]       Ph: [phone]       Fax: [phone]   RxID:   [id number]

## 2010-09-27 NOTE — Letter (Signed)
Summary: [name]  [facility]   Imported By: Lester [place]burg 09/18/2010 12:33:09  _____________________________________________________________________  External Attachment:    Type:   Image     Comment:   External Document

## 2010-09-27 NOTE — Assessment & Plan Note (Signed)
Summary: yearly--per pt labs will be done @ Dr Altheimer--stc   Vital Signs:  Patient profile:   70 year old female Height:      63 inches Weight:      171 pounds BMI:     30.40 Temp:     97.1 degrees F oral Pulse rate:   80 / minute Pulse rhythm:   regular Resp:     16 per minute BP sitting:   138 / 80  (left arm) Cuff size:   regular  Vitals Entered By: Lanier Prude, Beverly Gust) (August 07, 2010 10:36 AM) CC: [facility] Is Patient Diabetic? Yes Comments pt is not taking Cymbalta or Wellbutrin   Primary Care Provider:  Sonda Primes, MD  CC:  [facility].  History of Present Illness: The patient presents for a follow up of hypertension, diabetes, hyperlipidemia  C/o being more depressed since OCT 2011 The patient presents for a preventive health examination   Current Medications (verified): 1)  Lovastatin 20 Mg Tabs (Lovastatin) .Marland Kitchen.. 1 Qd 2)  Glucophage Xr 500 Mg  Tb24 (Metformin Hcl) .... 2 Bid 3)  Wellbutrin Sr 200 Mg Xr12h-Tab (Bupropion Hcl) .... 2 Once Daily 4)  Furosemide 20 Mg Tabs (Furosemide) .Marland Kitchen.. 1 Qam 5)  Lorazepam 0.5 Mg  Tabs (Lorazepam) .Marland Kitchen.. 1 At Bedtime Prn 6)  Vivelle-Dot 0.0375 Mg/24hr  Pttw (Estradiol) .... Twice Weekly To Skin 7)  Zyrtec Allergy 10 Mg  Tabs (Cetirizine Hcl) .Marland Kitchen.. 1 Once Daily Prn 8)  Vitamin D3 1000 Unit  Tabs (Cholecalciferol) .... 2 Tabs Qd 9)  Aspir-Low 81 Mg Tbec (Aspirin) .Marland Kitchen.. 1 Once Daily Pc 10)  Cymbalta 60 Mg Cpep (Duloxetine Hcl) .... Once Daily 11)  Metoclopramide Hcl 10 Mg Tabs (Metoclopramide Hcl) .... Take One By Mouth Once Daily 12)  Actos 45 Mg Tabs (Pioglitazone Hcl) .... Once Daily 13)  Lantus 100 Unit/ml Soln (Insulin Glargine) .... 20 Units Once Daily 14)  Augmentin 500-125 Mg Tabs (Amoxicillin-Pot Clavulanate) .Marland Kitchen.. 1 By Mouth Three Times A Day Prn 15)  Victoza 18 Mg/52ml Soln (Liraglutide) .... 1.8 Once A Day 16)  Align  Caps (Probiotic Product) .... Once Daily 17)  First-Testosterone Mc 2 % Crea (Testosterone Propionate) ....  Small Amount Once Daily 18)  Prevacid 30 Mg Cpdr (Lansoprazole) .... One Tablet By Mouth Once Daily 19)  Zantac 150 Maximum Strength 150 Mg Tabs (Ranitidine Hcl) .... Take One By Mouth As Needed 20)  Daily Multi  Tabs (Multiple Vitamins-Minerals) .Marland Kitchen.. 1 Once Daily 21)  B Complex  Tabs (B Complex Vitamins) .Marland Kitchen.. 1 Once Daily 22)  Pristiq 50 Mg Xr24h-Tab (Desvenlafaxine Succinate) .Marland Kitchen.. 1 By Mouth Once Daily  Allergies (verified): 1)  ! Codeine 2)  ! Pravachol  Past History:  Past Surgical History: Last updated: 07/13/2009 Cholecystectomy Hysterectomy Oophorectomy Splenectomy Tonsillectomy right wrist cyst removal benign breast bx bilateral  Family History: Last updated: 07/13/2009 Family History Hypertension Family History of Breast Cancer:sister Family History of Colon Cancer:mother dxed 38 deied 29 Family History of Colon Polyps:mother Family History of Diabetes: father  Social History: Last updated: 12/13/2009 Occupation:retired Married with three sons Never Smoked Alcohol Use - no Daily Caffeine Use rare  Illicit Drug Use - no Patient does  [facility]   Past Medical History: Anxiety Depression [name] Diabetes mellitus, type II GERD Hypertension Osteoarthritis Irritable Bowel Syndrome Hyperlipidemia Diverticulosis Hemorrhoids Gastritis Adenomatous Colon Polyps 1992 Chronic Headaches  Review of Systems       The patient  complains of depression.  The patient denies anorexia, fever, weight loss, weight gain, vision loss, decreased hearing, hoarseness, chest pain, syncope, dyspnea on exertion, peripheral edema, prolonged cough, headaches, hemoptysis, abdominal pain, melena, hematochezia, severe indigestion/heartburn, hematuria, incontinence, genital sores, muscle weakness, suspicious skin lesions, transient blindness, difficulty walking, unusual weight change, abnormal bleeding, enlarged lymph nodes, angioedema, and breast  masses.         Tired  Physical Exam  General:  Well-developed,overweight-appearing.   Head:  Normocephalic and atraumatic without obvious abnormalities. No apparent alopecia or balding. Eyes:  No corneal or conjunctival inflammation noted. EOMI. Perrla. Funduscopic exam benign, without hemorrhages, exudates or papilledema. Vision grossly normal. Ears:  External ear exam shows no significant lesions or deformities.  Otoscopic examination reveals clear canals, tympanic membranes are intact bilaterally without bulging, retraction, inflammation or discharge. Hearing is grossly normal bilaterally. Nose:  External nasal examination shows no deformity or inflammation. Nasal mucosa are pink and moist without lesions or exudates. Mouth:  Oral mucosa and oropharynx without lesions or exudates.  Teeth in good repair. Neck:  No deformities, masses, or tenderness noted. Lungs:  Normal respiratory effort, chest expands symmetrically. Lungs are clear to auscultation, no crackles or wheezes. Heart:  Normal rate and regular rhythm. S1 and S2 normal without gallop, murmur, click, rub or other extra sounds. Abdomen:  Bowel sounds positive,abdomen soft and non-tender without masses, organomegaly or hernias noted. Msk:  OA 1st MCP B Pulses:  R and L carotid,radial,femoral,dorsalis pedis and posterior tibial pulses are full and equal bilaterally Extremities:  No clubbing, cyanosis, edema, or deformity noted with normal full range of motion of all joints.   Neurologic:  No cranial nerve deficits noted. Station and gait are normal. Plantar reflexes are down-going bilaterally. DTRs are symmetrical throughout. Sensory, motor and coordinative functions appear intact. Skin:  Intact without suspicious lesions or rashes Cervical Nodes:  No lymphadenopathy noted Inguinal Nodes:  No significant adenopathy Psych:  Cognition and judgment appear intact. Alert and cooperative with normal attention span and concentration. No  apparent delusions, illusions, hallucinations   Impression & Recommendations:  Problem # 1:  Preventive Health Care (ICD-V70.0) Assessment New  Health and age related issues were discussed. Available screening tests and vaccinations were discussed as well. Healthy life style including good diet and exercise was discussed. Labs is pending   Problem # 2:  DEPRESSION (ICD-311) Assessment: Deteriorated  The following medications were removed from the medication list:    Cymbalta 60 Mg Cpep (Duloxetine hcl) ..... Once daily Her updated medication list for this problem includes:    Wellbutrin Sr 200 Mg Xr12h-tab (Bupropion hcl) .Marland Kitchen... 2 once daily    Lorazepam 0.5 Mg Tabs (Lorazepam) .Marland Kitchen... 1 at bedtime prn    Pristiq 50 Mg Xr24h-tab (Desvenlafaxine succinate) .Marland Kitchen... 1 by mouth once daily  Orders: TLB-A1C / Hgb A1C (Glycohemoglobin) (83036-A1C)  Problem # 3:  FATIGUE (ICD-780.79) Assessment: Deteriorated  See "Patient Instructions".   Orders: TLB-A1C / Hgb A1C (Glycohemoglobin) (83036-A1C)  Problem # 4:  DIABETES MELLITUS, TYPE II (ICD-250.00) Assessment: Unchanged  Her updated medication list for this problem includes:    Glucophage Xr 500 Mg Tb24 (Metformin hcl) .Marland Kitchen... 2 bid    Aspir-low 81 Mg Tbec (Aspirin) .Marland Kitchen... 1 once daily pc    Actos 45 Mg Tabs (Pioglitazone hcl) ..... Once daily    Lantus 100 Unit/ml Soln (Insulin glargine) .Marland Kitchen... 20 units once daily    Victoza 18 Mg/49ml Soln (Liraglutide) .Marland Kitchen... 1.8 once a day  Orders: TLB-A1C /  Hgb A1C (Glycohemoglobin) (83036-A1C)  Problem # 5:  UTI (ICD-599.0) Assessment: New     Ciprofloxacin Hcl 500 Mg Tabs (Ciprofloxacin hcl) .Marland Kitchen... 1 by mouth bid  Complete Medication List: 1)  Lovastatin 20 Mg Tabs (Lovastatin) .Marland Kitchen.. 1 qd 2)  Glucophage Xr 500 Mg Tb24 (Metformin hcl) .... 2 bid 3)  Wellbutrin Sr 200 Mg Xr12h-tab (Bupropion hcl) .... 2 once daily 4)  Furosemide 20 Mg Tabs (Furosemide) .Marland Kitchen.. 1 qam 5)  Lorazepam 0.5 Mg Tabs  (Lorazepam) .Marland Kitchen.. 1 at bedtime prn 6)  Vivelle-dot 0.0375 Mg/24hr Pttw (Estradiol) .... Twice weekly to skin 7)  Zyrtec Allergy 10 Mg Tabs (Cetirizine hcl) .Marland Kitchen.. 1 once daily prn 8)  Vitamin D3 1000 Unit Tabs (Cholecalciferol) .... 2 tabs qd 9)  Aspir-low 81 Mg Tbec (Aspirin) .Marland Kitchen.. 1 once daily pc 10)  Metoclopramide Hcl 10 Mg Tabs (Metoclopramide hcl) .... Take one by mouth once daily 11)  Actos 45 Mg Tabs (Pioglitazone hcl) .... Once daily 12)  Lantus 100 Unit/ml Soln (Insulin glargine) .... 20 units once daily 13)  Augmentin 500-125 Mg Tabs (Amoxicillin-pot clavulanate) .Marland Kitchen.. 1 by mouth three times a day prn 14)  Victoza 18 Mg/17ml Soln (Liraglutide) .... 1.8 once a day 15)  Align Caps (Probiotic product) .... Once daily 16)  First-testosterone Mc 2 % Crea (Testosterone propionate) .... Small amount once daily 17)  Prevacid 30 Mg Cpdr (Lansoprazole) .... One tablet by mouth once daily 18)  Zantac 150 Maximum Strength 150 Mg Tabs (Ranitidine hcl) .... Take one by mouth as needed 19)  Daily Multi Tabs (Multiple vitamins-minerals) .Marland Kitchen.. 1 once daily 20)  B Complex Tabs (B complex vitamins) .Marland Kitchen.. 1 once daily 21)  Pristiq 50 Mg Xr24h-tab (Desvenlafaxine succinate) .Marland Kitchen.. 1 by mouth once daily 22)  Ciprofloxacin Hcl 500 Mg Tabs (Ciprofloxacin hcl) .Marland Kitchen.. 1 by mouth bid  Other Orders: TLB-BMP (Basic Metabolic Panel-BMET) (80048-METABOL) TLB-CBC Platelet - w/Differential (85025-CBCD) TLB-Hepatic/Liver Function Pnl (80076-HEPATIC) TLB-Lipid Panel (80061-LIPID) TLB-Sedimentation Rate (ESR) (85652-ESR) TLB-TSH (Thyroid Stimulating Hormone) (84443-TSH) TLB-Udip ONLY (81003-UDIP) TLB-B12, Serum-Total ONLY (16109-U04)  Patient Instructions: 1)  Take Pristique at hs 2)  Please schedule a follow-up appointment in 4 months. 3)  BMP prior to visit, ICD-9: 4)  HbgA1C prior to visit, ICD-9:250.00 5)  Call if problems Prescriptions: CIPROFLOXACIN HCL 500 MG TABS (CIPROFLOXACIN HCL) 1 by mouth bid  #20 x 1    Entered and Authorized by:   Tresa Garter MD   Signed by:   Tresa Garter MD on 08/07/2010   Method used:   Electronically to        [facility] (retail)       [address]       [place], [place]  [address]       Ph: [phone]       Fax: [phone]   RxID:   [id number]    Orders Added: 1)  TLB-A1C / Hgb A1C (Glycohemoglobin) [83036-A1C] 2)  TLB-BMP (Basic Metabolic Panel-BMET) [80048-METABOL] 3)  TLB-CBC Platelet - w/Differential [85025-CBCD] 4)  TLB-Hepatic/Liver Function Pnl [80076-HEPATIC] 5)  TLB-Lipid Panel [80061-LIPID] 6)  TLB-Sedimentation Rate (ESR) [85652-ESR] 7)  TLB-TSH (Thyroid Stimulating Hormone) [84443-TSH] 8)  TLB-Udip ONLY [81003-UDIP] 9)  TLB-B12, Serum-Total ONLY [82607-B12] 10)  Est. Patient age 63&> [10272]   Immunization History:  Pneumovax Immunization History:    Pneumovax:  historical (05/14/2006)   Immunization History:  Pneumovax Immunization History:    Pneumovax:  Historical (05/14/2006)

## 2010-10-31 ENCOUNTER — Other Ambulatory Visit: Payer: Self-pay | Admitting: Gynecology

## 2010-10-31 DIAGNOSIS — Z1231 Encounter for screening mammogram for malignant neoplasm of breast: Secondary | ICD-10-CM

## 2010-11-22 ENCOUNTER — Ambulatory Visit: Payer: Self-pay

## 2010-11-27 ENCOUNTER — Ambulatory Visit: Payer: Self-pay

## 2010-12-03 ENCOUNTER — Other Ambulatory Visit: Payer: Self-pay | Admitting: Internal Medicine

## 2010-12-03 DIAGNOSIS — E119 Type 2 diabetes mellitus without complications: Secondary | ICD-10-CM

## 2010-12-04 ENCOUNTER — Other Ambulatory Visit: Payer: Self-pay

## 2010-12-04 ENCOUNTER — Ambulatory Visit: Payer: Self-pay

## 2010-12-06 ENCOUNTER — Ambulatory Visit: Payer: Self-pay

## 2010-12-11 ENCOUNTER — Ambulatory Visit (INDEPENDENT_AMBULATORY_CARE_PROVIDER_SITE_OTHER): Payer: Medicare Other | Admitting: Internal Medicine

## 2010-12-11 ENCOUNTER — Encounter: Payer: Self-pay | Admitting: Internal Medicine

## 2010-12-11 ENCOUNTER — Telehealth: Payer: Self-pay | Admitting: *Deleted

## 2010-12-11 DIAGNOSIS — I10 Essential (primary) hypertension: Secondary | ICD-10-CM

## 2010-12-11 DIAGNOSIS — F411 Generalized anxiety disorder: Secondary | ICD-10-CM

## 2010-12-11 DIAGNOSIS — R945 Abnormal results of liver function studies: Secondary | ICD-10-CM

## 2010-12-11 DIAGNOSIS — E119 Type 2 diabetes mellitus without complications: Secondary | ICD-10-CM

## 2010-12-11 NOTE — Assessment & Plan Note (Signed)
On Rx 

## 2010-12-11 NOTE — Telephone Encounter (Signed)
OK Pls fax to [name] and to Dr Altheimer Tacy Learn

## 2010-12-11 NOTE — Assessment & Plan Note (Signed)
Watching  

## 2010-12-11 NOTE — Telephone Encounter (Signed)
Pt left vm req MD send info/letter to [name]? - Has apt tomorrow at 11:45.

## 2010-12-11 NOTE — Progress Notes (Signed)
  Subjective:    Patient ID: [name], female    DOB: 06-18-1941, 70 y.o.   MRN: [id number]  HPI   The patient presents for a follow-up of  chronic hypertension, chronic dyslipidemia, type 2 diabetes controlled with medicines  C/o anxiety  Review of Systems  Constitutional: Positive for fatigue. Negative for activity change.  HENT: Negative for nosebleeds.   Eyes: Negative for pain.  Respiratory: Negative for cough.   Cardiovascular: Negative for chest pain.  Genitourinary: Negative for dysuria.  Musculoskeletal: Negative for gait problem.  Skin: Negative for rash.  Neurological: Positive for tremors. Negative for weakness and headaches.  Hematological: Negative for adenopathy.  Psychiatric/Behavioral: Positive for dysphoric mood and decreased concentration. Negative for suicidal ideas and agitation. The patient is nervous/anxious.   She has been withdrawn, tired Wt Readings from Last 3 Encounters:  12/11/10 162 lb (73.483 kg)  08/07/10 171 lb (77.565 kg)  04/18/10 178 lb (80.74 kg)       Objective:   Physical Exam  Constitutional: She appears well-developed and well-nourished. No distress.  HENT:  Head: Normocephalic.  Right Ear: External ear normal.  Left Ear: External ear normal.  Nose: Nose normal.  Mouth/Throat: Oropharynx is clear and moist.  Eyes: Conjunctivae are normal. Pupils are equal, round, and reactive to light. Right eye exhibits no discharge. Left eye exhibits no discharge.  Neck: Normal range of motion. Neck supple. No JVD present. No tracheal deviation present. No thyromegaly present.  Cardiovascular: Normal rate, regular rhythm and normal heart sounds.   Pulmonary/Chest: No stridor. No respiratory distress. She has no wheezes.  Abdominal: Soft. Bowel sounds are normal. She exhibits no distension and no mass. There is no tenderness. There is no rebound and no guarding.  Musculoskeletal: She exhibits no edema and no tenderness.  Lymphadenopathy:   She has no cervical adenopathy.  Neurological: She displays normal reflexes. No cranial nerve deficit. She exhibits normal muscle tone. Coordination normal.  Skin: No rash noted. She is not diaphoretic. No erythema.  Psychiatric: She has a normal mood and affect. Her behavior is normal. Judgment and thought content normal.       Sad          Assessment & Plan:  ANXIETY Worse - f/u with [name]  DIABETES MELLITUS, TYPE II On Rx  HYPERTENSION On Rx  LIVER FUNCTION TESTS, ABNORMAL Watching    Depression - severe/worse  F/u w/[name] is pending. She does have a lot of anxiety and Apathy. She seems to be much worse than she shows  Tremor  It could be early Parkinson's  Fatigue  She may need add. Labs drawn

## 2010-12-11 NOTE — Assessment & Plan Note (Signed)
Worse - f/u with [name]

## 2010-12-12 NOTE — Telephone Encounter (Signed)
Faxed

## 2010-12-13 ENCOUNTER — Ambulatory Visit
Admission: RE | Admit: 2010-12-13 | Discharge: 2010-12-13 | Disposition: A | Discharge status: Home / Self Care | Payer: Medicare Other | Source: Ambulatory Visit | Attending: Gynecology | Admitting: Gynecology

## 2010-12-13 DIAGNOSIS — Z1231 Encounter for screening mammogram for malignant neoplasm of breast: Secondary | ICD-10-CM

## 2011-01-02 ENCOUNTER — Other Ambulatory Visit: Payer: Self-pay | Admitting: Gynecology

## 2011-01-11 NOTE — Assessment & Plan Note (Signed)
[facility]                         GASTROENTEROLOGY OFFICE NOTE   NAME:Owen, Cheryl LUCKENBAUGH                       MRN:          [id number]  DATE:12/29/2006                            DOB:          [birth year]    REASON FOR VISIT:  This is a very nice patient who comes in on Dec 29, 2006, for followup.  No GI problems at this time.  She says she has been  doing well.  I last saw her in December 2006, at which time she had mild  symptoms of GERD, history of irritable bowel syndrome related to a  tortuous colon and some mild diabetic gastroparesis, diabetes,  hyperlipidemia and arthritis.  She has taken Xifaxan in the past,  Reglan, Protonix and some MiraLax.   PHYSICAL EXAMINATION:  VITAL SIGNS:  On exam today, she weighed 191,  blood pressure 122/86, pulse 88 and regular.  The rest of the exam is  unremarkable.   IMPRESSION:  As above.   PLAN:  Take Protonix once or twice daily.  Continue Reglan at bedtime at  10 mg, Pancrease p.r.n. about 10 a month because I really did not think  she needed to take it every day.     Ulyess Mort, MD  Electronically Signed    SML/MedQ  DD: 12/29/2006  DT: 12/30/2006  Job #: [id number]

## 2011-04-02 ENCOUNTER — Telehealth: Payer: Self-pay | Admitting: *Deleted

## 2011-04-02 MED ORDER — LOVASTATIN 20 MG PO TABS: 20.0000 mg | ORAL_TABLET | Freq: Every day | ORAL | Status: DC

## 2011-04-02 MED ORDER — FUROSEMIDE 20 MG PO TABS: 20.0000 mg | ORAL_TABLET | ORAL | Status: DC

## 2011-04-02 NOTE — Telephone Encounter (Signed)
RF mail order

## 2011-04-10 ENCOUNTER — Ambulatory Visit (INDEPENDENT_AMBULATORY_CARE_PROVIDER_SITE_OTHER): Payer: Medicare Other | Admitting: Internal Medicine

## 2011-04-10 ENCOUNTER — Encounter: Payer: Self-pay | Admitting: Internal Medicine

## 2011-04-10 DIAGNOSIS — I10 Essential (primary) hypertension: Secondary | ICD-10-CM

## 2011-04-10 DIAGNOSIS — K5900 Constipation, unspecified: Secondary | ICD-10-CM

## 2011-04-10 DIAGNOSIS — F411 Generalized anxiety disorder: Secondary | ICD-10-CM

## 2011-04-10 DIAGNOSIS — E119 Type 2 diabetes mellitus without complications: Secondary | ICD-10-CM

## 2011-04-10 MED ORDER — LUBIPROSTONE 24 MCG PO CAPS: 24.0000 ug | ORAL_CAPSULE | Freq: Two times a day (BID) | ORAL | Status: DC

## 2011-04-10 NOTE — Progress Notes (Signed)
  Subjective:    Patient ID: [name], female    DOB: 1940/12/24, 70 y.o.   MRN: [id number]  HPI  The patient presents for a follow-up of  chronic hypertension, chronic dyslipidemia, type 2 diabetes controlled with medicines  Depression is better on Lithium C/o bad constipation   Review of Systems  Constitutional: Negative for chills, activity change, appetite change, fatigue and unexpected weight change.  HENT: Negative for congestion, mouth sores and sinus pressure.   Eyes: Negative for visual disturbance.  Respiratory: Negative for cough and chest tightness.   Gastrointestinal: Positive for constipation. Negative for nausea and abdominal pain.  Genitourinary: Negative for frequency, difficulty urinating and vaginal pain.  Musculoskeletal: Negative for back pain and gait problem.  Skin: Negative for pallor and rash.  Neurological: Negative for dizziness, tremors, weakness, numbness and headaches.  Psychiatric/Behavioral: Negative for suicidal ideas, hallucinations, confusion and sleep disturbance. The patient is nervous/anxious (better, less depressed).      Wt Readings from Last 3 Encounters:  04/10/11 160 lb (72.576 kg)  12/11/10 162 lb (73.483 kg)  08/07/10 171 lb (77.565 kg)       Objective:   Physical Exam  Constitutional: She appears well-developed and well-nourished. No distress.  HENT:  Head: Normocephalic.  Right Ear: External ear normal.  Left Ear: External ear normal.  Nose: Nose normal.  Mouth/Throat: Oropharynx is clear and moist.  Eyes: Conjunctivae are normal. Pupils are equal, round, and reactive to light. Right eye exhibits no discharge. Left eye exhibits no discharge.  Neck: Normal range of motion. Neck supple. No JVD present. No tracheal deviation present. No thyromegaly present.  Cardiovascular: Normal rate, regular rhythm and normal heart sounds.   Pulmonary/Chest: No stridor. No respiratory distress. She has no wheezes.  Abdominal: Soft.  Bowel sounds are normal. She exhibits no distension and no mass. There is no tenderness. There is no rebound and no guarding.  Musculoskeletal: She exhibits no edema and no tenderness.  Lymphadenopathy:    She has no cervical adenopathy.  Neurological: She displays normal reflexes. No cranial nerve deficit. She exhibits normal muscle tone. Coordination normal.  Skin: No rash noted. No erythema.  Psychiatric: She has a normal mood and affect. Her behavior is normal. Judgment and thought content normal.       Better!          Assessment & Plan:

## 2011-04-10 NOTE — Assessment & Plan Note (Signed)
Better  

## 2011-04-10 NOTE — Assessment & Plan Note (Signed)
On Rx 

## 2011-04-10 NOTE — Assessment & Plan Note (Signed)
Will try Amitiza 

## 2011-05-20 ENCOUNTER — Telehealth: Payer: Self-pay | Admitting: *Deleted

## 2011-05-20 NOTE — Telephone Encounter (Signed)
Pt left VM req RF of Augmentin for chest congestion, I called pt back and scheduled her for OV tomorrow for eval

## 2011-05-21 ENCOUNTER — Encounter: Payer: Self-pay | Admitting: Internal Medicine

## 2011-05-21 ENCOUNTER — Ambulatory Visit (INDEPENDENT_AMBULATORY_CARE_PROVIDER_SITE_OTHER): Payer: Medicare Other | Admitting: Internal Medicine

## 2011-05-21 DIAGNOSIS — J209 Acute bronchitis, unspecified: Secondary | ICD-10-CM | POA: Insufficient documentation

## 2011-05-21 DIAGNOSIS — Z9089 Acquired absence of other organs: Secondary | ICD-10-CM

## 2011-05-21 DIAGNOSIS — B373 Candidiasis of vulva and vagina: Secondary | ICD-10-CM

## 2011-05-21 DIAGNOSIS — B3731 Acute candidiasis of vulva and vagina: Secondary | ICD-10-CM | POA: Insufficient documentation

## 2011-05-21 MED ORDER — AMOXICILLIN-POT CLAVULANATE 875-125 MG PO TABS: 1.0000 | ORAL_TABLET | Freq: Two times a day (BID) | ORAL | Status: DC

## 2011-05-21 MED ORDER — FLUCONAZOLE 150 MG PO TABS: 150.0000 mg | ORAL_TABLET | Freq: Once | ORAL | Status: DC

## 2011-05-21 MED ORDER — FLUCONAZOLE 150 MG PO TABS: 150.0000 mg | ORAL_TABLET | Freq: Once | ORAL | Status: AC

## 2011-05-21 MED ORDER — AZITHROMYCIN 250 MG PO TABS: ORAL_TABLET | ORAL | Status: DC

## 2011-05-21 MED ORDER — HYDROCOD POLST-CHLORPHEN POLST 10-8 MG/5ML PO LQCR: 5.0000 mL | Freq: Two times a day (BID) | ORAL | Status: DC | PRN

## 2011-05-21 MED ORDER — AZITHROMYCIN 250 MG PO TABS: ORAL_TABLET | ORAL | Status: AC

## 2011-05-21 NOTE — Assessment & Plan Note (Signed)
Diflucan Rx 

## 2011-05-21 NOTE — Assessment & Plan Note (Signed)
Zpac Tussionex 

## 2011-05-21 NOTE — Progress Notes (Signed)
  Subjective:    Patient ID: [name], female    DOB: 01/05/1941, 70 y.o.   MRN: [id number]  HPI   HPI  C/o URI sx's x  6 days. C/o ST, cough, weakness. Not better with OTC medicines. Actually, the patient is getting worse. The patient did not sleep last night due to cough. C/o yeast C/o splenectomy Review of Systems  Constitutional: Positive for fever, chills and fatigue.  HENT: Positive for congestion,green rhinorrhea, sneezing and postnasal drip.   Eyes: Positive for photophobia and pain. Negative for discharge and visual disturbance.  Respiratory: Positive for cough and wheezing.   Positive for chest pain.  Gastrointestinal: Negative for vomiting, abdominal pain, diarrhea and abdominal distention.  Genitourinary: Negative for dysuria and difficulty urinating.  Skin: Negative for rash.  Neurological: Positive for dizziness, weakness and light-headedness.  B wax    Review of Systems     Objective:   Physical Exam  Constitutional: She appears well-developed and well-nourished. No distress.  HENT:  Head: Normocephalic.  Right Ear: External ear normal.  Left Ear: External ear normal.  Nose: Nose normal.  Mouth/Throat: Oropharynx is clear and moist.       eryth throat Coughing a lot Wax B  Eyes: Conjunctivae are normal. Pupils are equal, round, and reactive to light. Right eye exhibits no discharge. Left eye exhibits no discharge.  Neck: Normal range of motion. Neck supple. No JVD present. No tracheal deviation present. No thyromegaly present.  Cardiovascular: Normal rate, regular rhythm and normal heart sounds.   Pulmonary/Chest: No stridor. No respiratory distress. She has no wheezes.  Abdominal: Soft. Bowel sounds are normal. She exhibits no distension and no mass. There is no tenderness. There is no rebound and no guarding.  Musculoskeletal: She exhibits no edema and no tenderness.  Lymphadenopathy:    She has no cervical adenopathy.  Neurological: She displays  normal reflexes. No cranial nerve deficit. She exhibits normal muscle tone. Coordination normal.  Skin: No rash noted. No erythema.  Psychiatric: She has a normal mood and affect. Her behavior is normal. Judgment and thought content normal.          Assessment & Plan:

## 2011-05-21 NOTE — Assessment & Plan Note (Signed)
Augmentin prn 

## 2011-05-22 ENCOUNTER — Ambulatory Visit: Payer: Medicare Other

## 2011-06-13 ENCOUNTER — Ambulatory Visit (INDEPENDENT_AMBULATORY_CARE_PROVIDER_SITE_OTHER): Payer: Medicare Other | Admitting: *Deleted

## 2011-06-13 DIAGNOSIS — Z23 Encounter for immunization: Secondary | ICD-10-CM

## 2011-06-27 ENCOUNTER — Ambulatory Visit: Payer: Medicare Other | Attending: Sports Medicine | Admitting: Rehabilitation

## 2011-06-27 DIAGNOSIS — IMO0001 Reserved for inherently not codable concepts without codable children: Secondary | ICD-10-CM | POA: Insufficient documentation

## 2011-06-27 DIAGNOSIS — M2569 Stiffness of other specified joint, not elsewhere classified: Secondary | ICD-10-CM | POA: Insufficient documentation

## 2011-06-27 DIAGNOSIS — M545 Low back pain, unspecified: Secondary | ICD-10-CM | POA: Insufficient documentation

## 2011-07-01 ENCOUNTER — Ambulatory Visit: Payer: Medicare Other | Admitting: Physical Therapy

## 2011-07-03 ENCOUNTER — Ambulatory Visit: Payer: Medicare Other | Admitting: Rehabilitation

## 2011-07-05 ENCOUNTER — Ambulatory Visit: Payer: Medicare Other | Admitting: Physical Therapy

## 2011-07-08 ENCOUNTER — Ambulatory Visit: Payer: Medicare Other | Admitting: Physical Therapy

## 2011-08-01 ENCOUNTER — Encounter: Payer: Self-pay | Admitting: Internal Medicine

## 2011-08-07 ENCOUNTER — Encounter: Payer: Self-pay | Admitting: Internal Medicine

## 2011-08-07 ENCOUNTER — Ambulatory Visit (INDEPENDENT_AMBULATORY_CARE_PROVIDER_SITE_OTHER): Payer: Medicare Other | Admitting: Internal Medicine

## 2011-08-07 DIAGNOSIS — I10 Essential (primary) hypertension: Secondary | ICD-10-CM

## 2011-08-07 DIAGNOSIS — F329 Major depressive disorder, single episode, unspecified: Secondary | ICD-10-CM

## 2011-08-07 DIAGNOSIS — F3289 Other specified depressive episodes: Secondary | ICD-10-CM

## 2011-08-07 DIAGNOSIS — M1990 Unspecified osteoarthritis, unspecified site: Secondary | ICD-10-CM

## 2011-08-07 DIAGNOSIS — F411 Generalized anxiety disorder: Secondary | ICD-10-CM

## 2011-08-07 DIAGNOSIS — H6120 Impacted cerumen, unspecified ear: Secondary | ICD-10-CM

## 2011-08-07 DIAGNOSIS — J209 Acute bronchitis, unspecified: Secondary | ICD-10-CM

## 2011-08-07 DIAGNOSIS — E119 Type 2 diabetes mellitus without complications: Secondary | ICD-10-CM

## 2011-08-07 NOTE — Assessment & Plan Note (Signed)
Continue with current prescription therapy as reflected on the Med list.  

## 2011-08-07 NOTE — Assessment & Plan Note (Signed)
12/12 - viral - use OTC meds

## 2011-08-07 NOTE — Assessment & Plan Note (Signed)
irrigated

## 2011-08-07 NOTE — Assessment & Plan Note (Signed)
Better on Lithium (decreasing dose due to tremor)

## 2011-08-07 NOTE — Progress Notes (Signed)
  Subjective:    Patient ID: [name], female    DOB: 1940/09/13, 70 y.o.   MRN: [id number]  HPI   The patient is here to follow up on DM, HTN, chronic depression, anxiety,and chronic moderate LBP symptoms controlled with medicines, diet and exercise. Wt Readings from Last 3 Encounters:  08/07/11 162 lb (73.483 kg)  05/21/11 158 lb (71.668 kg)  04/10/11 160 lb (72.576 kg)   C/o URI sx's x 2-3 d  Review of Systems  Constitutional: Negative for chills, activity change, appetite change, fatigue and unexpected weight change.  HENT: Positive for congestion and rhinorrhea. Negative for mouth sores and sinus pressure.   Eyes: Negative for visual disturbance.  Respiratory: Positive for cough. Negative for chest tightness.   Gastrointestinal: Negative for nausea and abdominal pain.  Genitourinary: Negative for frequency, difficulty urinating and vaginal pain.  Musculoskeletal: Negative for back pain and gait problem.  Skin: Negative for pallor and rash.  Neurological: Negative for dizziness, tremors, weakness, numbness and headaches.  Psychiatric/Behavioral: Negative for suicidal ideas, confusion and sleep disturbance. The patient is not nervous/anxious.        Objective:   Physical Exam  Constitutional: She appears well-developed. No distress.  HENT:  Head: Normocephalic.  Right Ear: External ear normal.  Left Ear: External ear normal.  Nose: Nose normal.       eryth throat  Eyes: Conjunctivae are normal. Pupils are equal, round, and reactive to light. Right eye exhibits no discharge. Left eye exhibits no discharge.  Neck: Normal range of motion. Neck supple. No JVD present. No tracheal deviation present. No thyromegaly present.  Cardiovascular: Normal rate, regular rhythm and normal heart sounds.   Pulmonary/Chest: No stridor. No respiratory distress. She has no wheezes.  Abdominal: Soft. Bowel sounds are normal. She exhibits no distension and no mass. There is no tenderness.  There is no rebound and no guarding.  Musculoskeletal: She exhibits no edema and no tenderness.  Lymphadenopathy:    She has no cervical adenopathy.  Neurological: She displays normal reflexes. No cranial nerve deficit. She exhibits normal muscle tone. Coordination normal.  Skin: No rash noted. No erythema.  Psychiatric: She has a normal mood and affect. Her behavior is normal. Judgment and thought content normal.            Assessment & Plan:

## 2011-09-17 ENCOUNTER — Encounter: Payer: Self-pay | Admitting: Gastroenterology

## 2011-09-17 ENCOUNTER — Ambulatory Visit (INDEPENDENT_AMBULATORY_CARE_PROVIDER_SITE_OTHER): Payer: Medicare Other | Admitting: Gastroenterology

## 2011-09-17 VITALS — BP 118/66 | HR 72 | Ht 63.5 in | Wt 166.4 lb

## 2011-09-17 DIAGNOSIS — K5900 Constipation, unspecified: Secondary | ICD-10-CM

## 2011-09-17 DIAGNOSIS — K219 Gastro-esophageal reflux disease without esophagitis: Secondary | ICD-10-CM

## 2011-09-17 MED ORDER — DEXLANSOPRAZOLE 60 MG PO CPDR: 60.0000 mg | DELAYED_RELEASE_CAPSULE | Freq: Every day | ORAL | Status: DC

## 2011-09-17 MED ORDER — PEG-KCL-NACL-NASULF-NA ASC-C 100 G PO SOLR: 1.0000 | Freq: Once | ORAL | Status: DC

## 2011-09-17 NOTE — Progress Notes (Signed)
History of Present Illness: This is a 71 year old female who relates problems with chronic constipation that has gradually worsened over the past few years. She takes Xanax lost about every 2-3 days for bowel movements. As her constipation has worsened she notes increasing problems with abdominal bloating. She has GERD with frequent breakthrough symptoms. Her last colonoscopy was in 2007 showing a hyperplastic colon polyp, diverticulosis and external hemorrhoids. Denies weight loss, abdominal pain, diarrhea, change in stool caliber, melena, hematochezia, nausea, vomiting, dysphagia, chest pain.  Review of Systems: Pertinent positive and negative review of systems were noted in the above HPI section. All other review of systems were otherwise negative.  Current Medications, Allergies, Past Medical History, Past Surgical History, Family History and Social History were reviewed in [employer] record.  Physical Exam: General: Well developed , well nourished, no acute distress Head: Normocephalic and atraumatic Eyes:  sclerae anicteric, EOMI Ears: Normal auditory acuity Mouth: No deformity or lesions Neck: Supple, no masses or thyromegaly Lungs: Clear throughout to auscultation Heart: Regular rate and rhythm; no murmurs, rubs or bruits Abdomen: Soft, non tender and non distended. No masses, hepatosplenomegaly or hernias noted. Normal Bowel sounds Rectal: Deferred to colonoscopy Musculoskeletal: Symmetrical with no gross deformities  Skin: No lesions on visible extremities Pulses:  Normal pulses noted Extremities: No clubbing, cyanosis, edema or deformities noted Neurological: Alert oriented x 4, grossly nonfocal Cervical Nodes:  No significant cervical adenopathy Inguinal Nodes: No significant inguinal adenopathy Psychological:  Alert and cooperative. Normal mood and affect  Assessment and Recommendations:  1. Worsening constipation associated with abdominal bloating.  Rule out colorectal neoplasms. Begin MiraLax 2-3 times daily titrated for adequate bowel movements. May use senna plus as a rescue laxative. The risks, benefits, and alternatives to colonoscopy with possible biopsy and possible polypectomy were discussed with the patient and they consent to proceed.   2. GERD. Inadequate symptom control. Intensify all anti-reflux measures. Discontinue Prevacid and begin Dexilant 60 mg daily.

## 2011-09-17 NOTE — Patient Instructions (Signed)
You have been scheduled for a Colonoscopy with propofol. See separate instructions.  Pick up your prep kit from your pharmacy.  Use Miralax 17 grams in 8 oz of water 2-3 x daily for constipation. Hold Prevacid and start Dexilant samples one tablet by mouth once daily x 2 weeks.  Patient advised to avoid spicy, acidic, citrus, chocolate, mints, fruit and fruit juices.  Limit the intake of caffeine, alcohol and Soda.  Don't exercise too soon after eating.  Don't lie down within 3-4 hours of eating.  Elevate the head of your bed. cc: Sonda Primes, MD

## 2011-09-18 ENCOUNTER — Encounter: Payer: Self-pay | Admitting: Gastroenterology

## 2011-10-02 ENCOUNTER — Ambulatory Visit (AMBULATORY_SURGERY_CENTER): Payer: Medicare Other | Admitting: Gastroenterology

## 2011-10-02 ENCOUNTER — Encounter: Payer: Self-pay | Admitting: Gastroenterology

## 2011-10-02 DIAGNOSIS — D126 Benign neoplasm of colon, unspecified: Secondary | ICD-10-CM

## 2011-10-02 DIAGNOSIS — K5900 Constipation, unspecified: Secondary | ICD-10-CM

## 2011-10-02 HISTORY — DX: Benign neoplasm of colon, unspecified: D12.6

## 2011-10-02 MED ORDER — SODIUM CHLORIDE 0.9 % IV SOLN: 500.0000 mL | INTRAVENOUS | Status: DC

## 2011-10-02 NOTE — Op Note (Addendum)
[facility] [address] [place], [place]  [address]  COLONOSCOPY PROCEDURE REPORT  PATIENT:  [name], Koerber  MR#:  [id number] BIRTHDATE:  04-03-1941, 70 yrs. old  GENDER:  female ENDOSCOPIST:  Judie Petit T. Russella Dar, MD, [facility]  PROCEDURE DATE:  10/02/2011 PROCEDURE:  Colonoscopy with snare polypectomy ASA CLASS:  Class II INDICATIONS:  1) constipation MEDICATIONS:   MAC sedation, administered by CRNA, propofol (Diprivan) 240 mg IV DESCRIPTION OF PROCEDURE:   After the risks benefits and alternatives of the procedure were thoroughly explained, informed consent was obtained.  Digital rectal exam was performed and revealed no abnormalities.   The LB 180AL [id number] endoscope was introduced through the anus and advanced to the cecum, which was identified by both the appendix and ileocecal valve, limited by a tortuous colon and severe spasm.  The quality of the prep was adequate, using MoviPrep.  The instrument was then slowly withdrawn as the colon was fully examined. <<PROCEDUREIMAGES>> FINDINGS:  A sessile polyp was found in the cecum. It was 10 mm in size. Polyp was snared, then cauterized with monopolar cautery. Retrieval was successful. Otherwise normal colonoscopy without other polyps, masses, vascular ectasias, or inflammatory changes. Retroflexed views in the rectum revealed no abnormalities.  The time to cecum =  5  minutes. The scope was then withdrawn (time = 13.5  min) from the patient and the procedure completed.  COMPLICATIONS:  None  ENDOSCOPIC IMPRESSION: 1) 10 mm sessile polyp in the cecum  RECOMMENDATIONS: 1) Hold aspirin, aspirin products, and anti-inflammatory medication for 2 weeks. 2) Await pathology results 3) Repeat Colonoscopy in 3 years with a more extensive bowel prep 4) Continue Miralax for constipation.  Venita Lick. Russella Dar, MD, [facility]  n. REVISED:  10/07/2011 05:05 PM eSIGNED:   Venita Lick. Caytlin Better at 10/07/2011 05:05 PM  Ginette Pitman,  [phone]

## 2011-10-03 ENCOUNTER — Telehealth: Payer: Self-pay | Admitting: *Deleted

## 2011-10-03 NOTE — Telephone Encounter (Signed)
  Follow up Call-  Call back number 10/02/2011  Post procedure Call Back phone  # [phone]  Permission to leave phone message Yes     Patient questions:  Do you have a fever, pain , or abdominal swelling? no Pain Score  0 *  Have you tolerated food without any problems? yes  Have you been able to return to your normal activities? yes  Do you have any questions about your discharge instructions: Diet   no Medications  no Follow up visit  no  Do you have questions or concerns about your Care? no  Actions: * If pain score is 4 or above: No action needed, pain <4.

## 2011-10-07 ENCOUNTER — Encounter: Payer: Self-pay | Admitting: Gastroenterology

## 2011-11-11 ENCOUNTER — Other Ambulatory Visit: Payer: Self-pay | Admitting: Gynecology

## 2011-11-11 DIAGNOSIS — Z1231 Encounter for screening mammogram for malignant neoplasm of breast: Secondary | ICD-10-CM

## 2011-12-04 ENCOUNTER — Ambulatory Visit: Payer: Medicare Other | Admitting: Internal Medicine

## 2011-12-17 ENCOUNTER — Ambulatory Visit: Payer: Medicare Other

## 2011-12-20 ENCOUNTER — Ambulatory Visit: Payer: Medicare Other | Admitting: Internal Medicine

## 2011-12-23 ENCOUNTER — Ambulatory Visit
Admission: RE | Admit: 2011-12-23 | Discharge: 2011-12-23 | Disposition: A | Discharge status: Home / Self Care | Payer: Medicare Other | Source: Ambulatory Visit | Attending: Gynecology | Admitting: Gynecology

## 2011-12-23 DIAGNOSIS — Z1231 Encounter for screening mammogram for malignant neoplasm of breast: Secondary | ICD-10-CM

## 2012-02-26 ENCOUNTER — Ambulatory Visit: Payer: Medicare Other | Admitting: Internal Medicine

## 2012-03-04 ENCOUNTER — Ambulatory Visit (INDEPENDENT_AMBULATORY_CARE_PROVIDER_SITE_OTHER): Payer: Medicare Other | Admitting: Internal Medicine

## 2012-03-04 ENCOUNTER — Encounter: Payer: Self-pay | Admitting: Internal Medicine

## 2012-03-04 VITALS — BP 140/90 | HR 88 | Temp 98.4°F | Resp 16 | Wt 176.0 lb

## 2012-03-04 DIAGNOSIS — E119 Type 2 diabetes mellitus without complications: Secondary | ICD-10-CM

## 2012-03-04 DIAGNOSIS — K219 Gastro-esophageal reflux disease without esophagitis: Secondary | ICD-10-CM

## 2012-03-04 DIAGNOSIS — I10 Essential (primary) hypertension: Secondary | ICD-10-CM

## 2012-03-04 DIAGNOSIS — F411 Generalized anxiety disorder: Secondary | ICD-10-CM

## 2012-03-04 MED ORDER — LOVASTATIN 20 MG PO TABS: 20.0000 mg | ORAL_TABLET | Freq: Every day | ORAL | Status: DC

## 2012-03-04 MED ORDER — CYCLOBENZAPRINE HCL 5 MG PO TABS: 5.0000 mg | ORAL_TABLET | Freq: Two times a day (BID) | ORAL | Status: AC | PRN

## 2012-03-04 MED ORDER — FUROSEMIDE 20 MG PO TABS: 20.0000 mg | ORAL_TABLET | ORAL | Status: DC

## 2012-03-04 NOTE — Patient Instructions (Signed)
[email or web address] 

## 2012-03-04 NOTE — Assessment & Plan Note (Signed)
Continue with current prescription therapy as reflected on the Med list.  

## 2012-03-04 NOTE — Progress Notes (Signed)
Patient ID: [name], female   DOB: 12/11/1940, 71 y.o.   MRN: [id number]  Subjective:    Patient ID: [name], female    DOB: 1940/09/29, 71 y.o.   MRN: [id number]  Back Pain This is a recurrent problem. The current episode started 1 to 4 weeks ago. The problem has been gradually improving since onset. The pain is present in the lumbar spine. The pain is at a severity of 4/10. The pain is moderate. Pertinent negatives include no abdominal pain, headaches, numbness or weakness. The treatment provided no relief.  She is seeing a chiropractor   The patient is here to follow up on DM, HTN, chronic depression, anxiety,and chronic moderate LBP symptoms controlled with medicines, diet and exercise.  Wt Readings from Last 3 Encounters:  03/04/12 176 lb (79.833 kg)  10/02/11 166 lb (75.297 kg)  09/17/11 166 lb 6.4 oz (75.479 kg)   BP Readings from Last 3 Encounters:  03/04/12 140/90  10/02/11 129/76  09/17/11 118/66      Review of Systems  Constitutional: Negative for chills, activity change, appetite change, fatigue and unexpected weight change.  HENT: Positive for congestion and rhinorrhea. Negative for mouth sores and sinus pressure.   Eyes: Negative for visual disturbance.  Respiratory: Positive for cough. Negative for chest tightness.   Gastrointestinal: Negative for nausea and abdominal pain.  Genitourinary: Negative for frequency, difficulty urinating and vaginal pain.  Musculoskeletal: Positive for back pain. Negative for gait problem.  Skin: Negative for pallor and rash.  Neurological: Negative for dizziness, tremors, weakness, numbness and headaches.  Psychiatric/Behavioral: Negative for suicidal ideas, confusion and disturbed wake/sleep cycle. The patient is not nervous/anxious.        Objective:   Physical Exam  Constitutional: She appears well-developed. No distress.  HENT:  Head: Normocephalic.  Right Ear: External ear normal.  Left Ear: External ear  normal.  Nose: Nose normal.       eryth throat  Eyes: Conjunctivae are normal. Pupils are equal, round, and reactive to light. Right eye exhibits no discharge. Left eye exhibits no discharge.  Neck: Normal range of motion. Neck supple. No JVD present. No tracheal deviation present. No thyromegaly present.  Cardiovascular: Normal rate, regular rhythm and normal heart sounds.   Pulmonary/Chest: No stridor. No respiratory distress. She has no wheezes.  Abdominal: Soft. Bowel sounds are normal. She exhibits no distension and no mass. There is no tenderness. There is no rebound and no guarding.  Musculoskeletal: She exhibits no edema and no tenderness.  Lymphadenopathy:    She has no cervical adenopathy.  Neurological: She displays normal reflexes. No cranial nerve deficit. She exhibits normal muscle tone. Coordination normal.  Skin: No rash noted. No erythema.  Psychiatric: She has a normal mood and affect. Her behavior is normal. Judgment and thought content normal.    Lab Results  Component Value Date   WBC 11.3* 08/07/2010   HGB 13.6 08/07/2010   HCT 40.0 08/07/2010   PLT 479.0* 08/07/2010   GLUCOSE 135* 08/07/2010   CHOL 116 08/07/2010   TRIG 86.0 08/07/2010   HDL 50.40 08/07/2010   LDLCALC 48 08/07/2010   ALT 29 08/07/2010   AST 33 08/07/2010   NA 135 08/07/2010   K 4.4 08/07/2010   CL 96 08/07/2010   CREATININE 0.8 08/07/2010   BUN 11 08/07/2010   CO2 29 08/07/2010   TSH 1.26 08/07/2010   HGBA1C 7.3* 08/07/2010   MICROALBUR 0.2 11/07/2006  Assessment & Plan:

## 2012-03-05 ENCOUNTER — Encounter: Payer: Self-pay | Admitting: Internal Medicine

## 2012-06-02 ENCOUNTER — Ambulatory Visit (INDEPENDENT_AMBULATORY_CARE_PROVIDER_SITE_OTHER): Payer: Medicare Other | Admitting: *Deleted

## 2012-06-02 DIAGNOSIS — Z23 Encounter for immunization: Secondary | ICD-10-CM

## 2012-07-01 ENCOUNTER — Ambulatory Visit (INDEPENDENT_AMBULATORY_CARE_PROVIDER_SITE_OTHER): Payer: Medicare Other | Admitting: Internal Medicine

## 2012-07-01 ENCOUNTER — Encounter: Payer: Self-pay | Admitting: Internal Medicine

## 2012-07-01 VITALS — BP 142/84 | HR 80 | Temp 98.2°F | Resp 16 | Ht 63.5 in | Wt 173.0 lb

## 2012-07-01 DIAGNOSIS — I10 Essential (primary) hypertension: Secondary | ICD-10-CM

## 2012-07-01 DIAGNOSIS — Z23 Encounter for immunization: Secondary | ICD-10-CM

## 2012-07-01 DIAGNOSIS — Z0000 Encounter for general adult medical examination without abnormal findings: Secondary | ICD-10-CM

## 2012-07-01 MED ORDER — CANAGLIFLOZIN 300 MG PO TABS: 300.0000 mg | ORAL_TABLET | Freq: Every day | ORAL | Status: DC

## 2012-07-01 NOTE — Assessment & Plan Note (Signed)
The patient is here for annual Medicare wellness examination and management of other chronic and acute problems.   The risk factors are reflected in the social history.  The roster of all physicians providing medical care to patient - is listed in the Snapshot section of the chart.  Activities of daily living:  The patient is 100% inedpendent in all ADLs: dressing, toileting, feeding as well as independent mobility  Home safety : good. Using seatbelts. There is no violence in the home.   There is no risks for hepatitis, STDs or HIV. There is no history of blood transfusion. They have no travel history to infectious disease endemic areas of the world.  The patient has  seen their dentist in the last 12 month. They have  seen their eye doctor in the last year. They deny  Any major hearing difficulty and have not had audiologic testing in the last year.  They do not  have excessive sun exposure. Discussed the need for sun protection: hats, long sleeves and use of sunscreen if there is significant sun exposure.   Diet: the importance of a healthy diet is discussed. They do have a reasonably healthy  diet.  The patient has a fairly regular exercise program of a mixed nature: walking, yard work, etc.The benefits of regular aerobic exercise were discussed.  Depression screen: there are no signs or vegative symptoms of depression- irritability, change in appetite, anhedonia, sadness/tearfullness.  Cognitive assessment: the patient manages all their financial and personal affairs and is actively engaged. They could relate day,date,year and events; recalled 3/3 objects at 3 minutes  The following portions of the patient's history were reviewed and updated as appropriate: allergies, current medications, past family history, past medical history,  past surgical history, past social history  and problem list.  Vision, hearing, body mass index were assessed and reviewed.   During the course of the visit  the patient was educated and counseled about appropriate screening and preventive services including : fall prevention , diabetes screening, nutrition counseling, colorectal cancer screening, and recommended immunizations.  

## 2012-07-01 NOTE — Assessment & Plan Note (Signed)
Continue with current prescription therapy as reflected on the Med list.  

## 2012-07-01 NOTE — Progress Notes (Signed)
   Subjective:    Patient ID: [name], female    DOB: 1940/10/17, 71 y.o.   MRN: [id number]  HPI The patient is here for a wellness exam. The patient has been doing well overall without major physical or psychological issues going on lately. The patient presents for a follow-up of  chronic hypertension, chronic dyslipidemia, type 2 diabetes controlled with medicines  Depression is better on Rx C/o bad constipation   Review of Systems  Constitutional: Negative for chills, activity change, appetite change, fatigue and unexpected weight change.  HENT: Negative for congestion, mouth sores and sinus pressure.   Eyes: Negative for visual disturbance.  Respiratory: Negative for cough and chest tightness.   Gastrointestinal: Positive for constipation. Negative for nausea and abdominal pain.  Genitourinary: Negative for frequency, difficulty urinating and vaginal pain.  Musculoskeletal: Negative for back pain and gait problem.  Skin: Negative for pallor and rash.  Neurological: Negative for dizziness, tremors, weakness, numbness and headaches.  Psychiatric/Behavioral: Negative for suicidal ideas, hallucinations, confusion and sleep disturbance. The patient is nervous/anxious (better, less depressed).      Wt Readings from Last 3 Encounters:  07/01/12 173 lb (78.472 kg)  03/04/12 176 lb (79.833 kg)  10/02/11 166 lb (75.297 kg)   BP Readings from Last 3 Encounters:  07/01/12 142/84  03/04/12 140/90  10/02/11 129/76        Objective:   Physical Exam  Constitutional: She appears well-developed and well-nourished. No distress.  HENT:  Head: Normocephalic.  Right Ear: External ear normal.  Left Ear: External ear normal.  Nose: Nose normal.  Mouth/Throat: Oropharynx is clear and moist.  Eyes: Conjunctivae normal are normal. Pupils are equal, round, and reactive to light. Right eye exhibits no discharge. Left eye exhibits no discharge.  Neck: Normal range of motion. Neck  supple. No JVD present. No tracheal deviation present. No thyromegaly present.  Cardiovascular: Normal rate, regular rhythm and normal heart sounds.   Pulmonary/Chest: No stridor. No respiratory distress. She has no wheezes.  Abdominal: Soft. Bowel sounds are normal. She exhibits no distension and no mass. There is no tenderness. There is no rebound and no guarding.  Musculoskeletal: She exhibits no edema and no tenderness.  Lymphadenopathy:    She has no cervical adenopathy.  Neurological: She displays normal reflexes. No cranial nerve deficit. She exhibits normal muscle tone. Coordination normal.  Skin: No rash noted. No erythema.  Psychiatric: She has a normal mood and affect. Her behavior is normal. Judgment and thought content normal.       Better!   Lab Results  Component Value Date   WBC 11.3* 08/07/2010   HGB 13.6 08/07/2010   HCT 40.0 08/07/2010   PLT 479.0* 08/07/2010   GLUCOSE 135* 08/07/2010   CHOL 116 08/07/2010   TRIG 86.0 08/07/2010   HDL 50.40 08/07/2010   LDLCALC 48 08/07/2010   ALT 29 08/07/2010   AST 33 08/07/2010   NA 135 08/07/2010   K 4.4 08/07/2010   CL 96 08/07/2010   CREATININE 0.8 08/07/2010   BUN 11 08/07/2010   CO2 29 08/07/2010   TSH 1.26 08/07/2010   HGBA1C 7.3* 08/07/2010   MICROALBUR 0.2 11/07/2006          Assessment & Plan:

## 2012-09-16 ENCOUNTER — Ambulatory Visit (INDEPENDENT_AMBULATORY_CARE_PROVIDER_SITE_OTHER)
Admission: RE | Admit: 2012-09-16 | Discharge: 2012-09-16 | Disposition: A | Discharge status: Home / Self Care | Payer: Medicare Other | Source: Ambulatory Visit | Attending: Internal Medicine | Admitting: Internal Medicine

## 2012-09-16 ENCOUNTER — Telehealth: Payer: Self-pay | Admitting: Internal Medicine

## 2012-09-16 ENCOUNTER — Ambulatory Visit (INDEPENDENT_AMBULATORY_CARE_PROVIDER_SITE_OTHER): Payer: Medicare Other | Admitting: Internal Medicine

## 2012-09-16 ENCOUNTER — Encounter: Payer: Self-pay | Admitting: Internal Medicine

## 2012-09-16 VITALS — BP 150/90 | HR 80 | Temp 98.1°F | Resp 16 | Wt 173.0 lb

## 2012-09-16 DIAGNOSIS — W19XXXA Unspecified fall, initial encounter: Secondary | ICD-10-CM | POA: Insufficient documentation

## 2012-09-16 DIAGNOSIS — I10 Essential (primary) hypertension: Secondary | ICD-10-CM

## 2012-09-16 DIAGNOSIS — E119 Type 2 diabetes mellitus without complications: Secondary | ICD-10-CM

## 2012-09-16 DIAGNOSIS — R51 Headache: Secondary | ICD-10-CM

## 2012-09-16 DIAGNOSIS — M545 Low back pain, unspecified: Secondary | ICD-10-CM | POA: Insufficient documentation

## 2012-09-16 DIAGNOSIS — M542 Cervicalgia: Secondary | ICD-10-CM | POA: Insufficient documentation

## 2012-09-16 MED ORDER — OXYCODONE HCL 5 MG PO TABS: 5.0000 mg | ORAL_TABLET | Freq: Two times a day (BID) | ORAL | Status: DC | PRN

## 2012-09-16 NOTE — Telephone Encounter (Signed)
[name], please, inform patient that her CT has some age related changes, otherwise OK Thx

## 2012-09-16 NOTE — Assessment & Plan Note (Signed)
Oxycod prn Ortho cons for further w/up

## 2012-09-16 NOTE — Assessment & Plan Note (Signed)
Continue with current prescription therapy as reflected on the Med list.  

## 2012-09-16 NOTE — Assessment & Plan Note (Signed)
  Ortho cons for further w/up

## 2012-09-16 NOTE — Progress Notes (Signed)
   Subjective:    HPI The pt fell twice in Dec 2013 - ?tripped. She had a couple near falls and one ner fall. C/o LBP, neck pain on L. The patient presents for a follow-up of  chronic hypertension, chronic dyslipidemia, type 2 diabetes controlled with medicines  Depression is better on Rx C/o bad neck pain 7-9/10 C/o LBP 5-7/10   Review of Systems  Constitutional: Negative for chills, activity change, appetite change, fatigue and unexpected weight change.  HENT: Negative for congestion, mouth sores and sinus pressure.   Eyes: Negative for visual disturbance.  Respiratory: Negative for cough and chest tightness.   Gastrointestinal: Positive for constipation. Negative for nausea and abdominal pain.  Genitourinary: Negative for frequency, difficulty urinating and vaginal pain.  Musculoskeletal: Negative for back pain and gait problem.  Skin: Negative for pallor and rash.  Neurological: Negative for dizziness, tremors, weakness, numbness and headaches.  Psychiatric/Behavioral: Negative for suicidal ideas, hallucinations, confusion and sleep disturbance. The patient is nervous/anxious (better, less depressed).      Wt Readings from Last 3 Encounters:  09/16/12 173 lb (78.472 kg)  07/01/12 173 lb (78.472 kg)  03/04/12 176 lb (79.833 kg)   BP Readings from Last 3 Encounters:  09/16/12 150/90  07/01/12 142/84  03/04/12 140/90        Objective:   Physical Exam  Constitutional: She appears well-developed and well-nourished. No distress.  HENT:  Head: Normocephalic.  Right Ear: External ear normal.  Left Ear: External ear normal.  Nose: Nose normal.  Mouth/Throat: Oropharynx is clear and moist.  Eyes: Conjunctivae normal are normal. Pupils are equal, round, and reactive to light. Right eye exhibits no discharge. Left eye exhibits no discharge.  Neck: Normal range of motion. Neck supple. No JVD present. No tracheal deviation present. No thyromegaly present.  Cardiovascular:  Normal rate, regular rhythm and normal heart sounds.   Pulmonary/Chest: No stridor. No respiratory distress. She has no wheezes.  Abdominal: Soft. Bowel sounds are normal. She exhibits no distension and no mass. There is no tenderness. There is no rebound and no guarding.  Musculoskeletal: She exhibits no edema and no tenderness.  Lymphadenopathy:    She has no cervical adenopathy.  Neurological: She displays normal reflexes. No cranial nerve deficit. She exhibits normal muscle tone. Coordination normal.       Mild ataxia Non-focal exam  Skin: No rash noted. No erythema.  Psychiatric: She has a normal mood and affect. Her behavior is normal. Judgment and thought content normal.       Better!   Lab Results  Component Value Date   WBC 11.3* 08/07/2010   HGB 13.6 08/07/2010   HCT 40.0 08/07/2010   PLT 479.0* 08/07/2010   GLUCOSE 135* 08/07/2010   CHOL 116 08/07/2010   TRIG 86.0 08/07/2010   HDL 50.40 08/07/2010   LDLCALC 48 08/07/2010   ALT 29 08/07/2010   AST 33 08/07/2010   NA 135 08/07/2010   K 4.4 08/07/2010   CL 96 08/07/2010   CREATININE 0.8 08/07/2010   BUN 11 08/07/2010   CO2 29 08/07/2010   TSH 1.26 08/07/2010   HGBA1C 7.3* 08/07/2010   MICROALBUR 0.2 11/07/2006    A complex case      Assessment & Plan:

## 2012-09-16 NOTE — Assessment & Plan Note (Signed)
12/13 x2 R/o NPH, spinal stenosis etc CT brain Ortho cons for further w/up

## 2012-09-17 ENCOUNTER — Telehealth: Payer: Self-pay | Admitting: *Deleted

## 2012-09-17 NOTE — Telephone Encounter (Signed)
It is atrophy (shrinking of the brain) and atherosclerosis (hardening of the blood vessels). Meds - as before Thx

## 2012-09-17 NOTE — Telephone Encounter (Signed)
Pt wants to know what "age related changes" means on her Head CT. Please advise.

## 2012-09-17 NOTE — Telephone Encounter (Signed)
Pt informed

## 2012-09-18 NOTE — Telephone Encounter (Signed)
Left mess for patient to call back.  

## 2012-09-30 NOTE — Telephone Encounter (Signed)
Left mess for patient to call back.  

## 2012-10-07 NOTE — Telephone Encounter (Signed)
[name] informed.

## 2012-10-28 ENCOUNTER — Ambulatory Visit: Payer: Medicare Other | Admitting: Internal Medicine

## 2012-11-25 ENCOUNTER — Other Ambulatory Visit: Payer: Self-pay

## 2012-11-25 DIAGNOSIS — Z1231 Encounter for screening mammogram for malignant neoplasm of breast: Secondary | ICD-10-CM

## 2012-12-23 ENCOUNTER — Ambulatory Visit
Admission: RE | Admit: 2012-12-23 | Discharge: 2012-12-23 | Disposition: A | Discharge status: Home / Self Care | Payer: Medicare Other | Source: Ambulatory Visit

## 2012-12-23 DIAGNOSIS — Z1231 Encounter for screening mammogram for malignant neoplasm of breast: Secondary | ICD-10-CM

## 2012-12-24 ENCOUNTER — Other Ambulatory Visit: Payer: Self-pay | Admitting: *Deleted

## 2012-12-24 DIAGNOSIS — N63 Unspecified lump in unspecified breast: Secondary | ICD-10-CM

## 2012-12-24 HISTORY — PX: THUMB FUSION: SUR636

## 2013-01-05 ENCOUNTER — Ambulatory Visit
Admission: RE | Admit: 2013-01-05 | Discharge: 2013-01-05 | Disposition: A | Discharge status: Home / Self Care | Payer: Medicare Other | Source: Ambulatory Visit | Attending: Gynecology | Admitting: Gynecology

## 2013-01-05 DIAGNOSIS — N63 Unspecified lump in unspecified breast: Secondary | ICD-10-CM

## 2013-01-07 ENCOUNTER — Encounter: Payer: Self-pay | Admitting: Gynecology

## 2013-01-07 ENCOUNTER — Ambulatory Visit (INDEPENDENT_AMBULATORY_CARE_PROVIDER_SITE_OTHER): Payer: Medicare Other | Admitting: Gynecology

## 2013-01-07 VITALS — BP 124/78 | Ht 62.5 in | Wt 170.0 lb

## 2013-01-07 DIAGNOSIS — Z79890 Hormone replacement therapy (postmenopausal): Secondary | ICD-10-CM

## 2013-01-07 DIAGNOSIS — M8580 Other specified disorders of bone density and structure, unspecified site: Secondary | ICD-10-CM

## 2013-01-07 DIAGNOSIS — L293 Anogenital pruritus, unspecified: Secondary | ICD-10-CM

## 2013-01-07 DIAGNOSIS — L292 Pruritus vulvae: Secondary | ICD-10-CM

## 2013-01-07 DIAGNOSIS — M899 Disorder of bone, unspecified: Secondary | ICD-10-CM

## 2013-01-07 DIAGNOSIS — N952 Postmenopausal atrophic vaginitis: Secondary | ICD-10-CM

## 2013-01-07 DIAGNOSIS — R6882 Decreased libido: Secondary | ICD-10-CM

## 2013-01-07 DIAGNOSIS — M949 Disorder of cartilage, unspecified: Secondary | ICD-10-CM

## 2013-01-07 MED ORDER — FLUCONAZOLE 150 MG PO TABS: 150.0000 mg | ORAL_TABLET | Freq: Once | ORAL | Status: DC

## 2013-01-07 NOTE — Patient Instructions (Addendum)
Stop the estrogen. Call me if you have any issues with this. Follow up for bone density as scheduled.

## 2013-01-07 NOTE — Progress Notes (Signed)
[name] 01/16/1941 [id number]        72 y.o.  G3P3003 new patient for followup exam.  Former patient of Dr. Nicholas Lose with several issues noted below.  Past medical history,surgical history, medications, allergies, family history and social history were all reviewed and documented in the EPIC chart. ROS:  Was performed and pertinent positives and negatives are included in the history.  Exam: Kim assistant Filed Vitals:   01/07/13 1407  BP: 124/78  Height: 5' 2.5" (1.588 m)  Weight: 170 lb (77.111 kg)   General appearance  Normal Skin grossly normal Head/Neck normal with no cervical or supraclavicular adenopathy thyroid normal Lungs  clear Cardiac RR, without RMG Abdominal  soft, nontender, without masses, organomegaly or hernia Breasts  examined lying and sitting without masses, retractions, discharge or axillary adenopathy. Pelvic  Ext/BUS/vagina  normal with atrophic changes  Adnexa  Without masses or tenderness    Anus and perineum  normal   Rectovaginal  normal sphincter tone without palpated masses or tenderness.    Assessment/Plan:  71 y.o. G3P3003 female.   1. Postmenopausal, status post TAH/BSO for leiomyomata endometriosis. 2. HRT. Patient is on Vivelle 0.075 patches that she cuts in half and use this twice weekly. Has been doing this for years. Apparently been told just to continue from a prophylactic standpoint. I reviewed the whole issue of HRT and the WHI study to include increased risk of stroke heart attack DVT and breast cancer. Transdermal versus oral first pass effect issues reviewed. She has never tried to wean I recommended that she go ahead and stop her patches now recognizing that she is really on a 0.0375 patch. I also reviewed with her cutting them in half his probably not a good idea from absorption standpoint. Assuming she does well stopping the patch then she will stay off of this and we'll see how she does. 3. Decreased libido. Patient uses 2%  testosterone cream small amount rubbed to the inner thigh nightly. I reviewed lack of studies supporting use of testosterone supplementation and the risks to include absorption with adverse lipid profile hair growth acne liver issues are reviewed. Patient's comfortable continuing and said that she really feels that it helps her and she just refilled her prescription. She'll call when she needs a refill as she has it formulated. 4. Osteopenia. DEXA 2 years ago Dr. Johnn Hai office. I do not have a copy of this. She reports being told that she had some loss of bone but not to the degree of treatment. Recommend repeat DEXA now a two-year interval. Increase calcium and vitamin D reviewed. 5. Mammography 12/2012. Continued annual mammography. Pap smear 2012. No Pap smear done today. No history of abnormal Pap smears previously. Review current screening guidelines. She is over the age of 76 and status post hysterectomy for benign indications. Both agree him stop screening and she is comfortable with this. 6. Colonoscopy 2013. Repeat at their recommended interval. 7. Health maintenance. No lab work done as it is all done through her other physician's offices who follow her for her medical issues. Followup one year, sooner as needed.    Dara Lords MD, 2:54 PM 01/07/2013

## 2013-01-13 ENCOUNTER — Telehealth: Payer: Self-pay

## 2013-01-13 ENCOUNTER — Ambulatory Visit (INDEPENDENT_AMBULATORY_CARE_PROVIDER_SITE_OTHER): Payer: Medicare Other | Admitting: Internal Medicine

## 2013-01-13 ENCOUNTER — Encounter: Payer: Self-pay | Admitting: Internal Medicine

## 2013-01-13 VITALS — BP 130/70 | HR 76 | Temp 97.7°F | Resp 16 | Wt 172.0 lb

## 2013-01-13 DIAGNOSIS — F329 Major depressive disorder, single episode, unspecified: Secondary | ICD-10-CM

## 2013-01-13 DIAGNOSIS — I10 Essential (primary) hypertension: Secondary | ICD-10-CM

## 2013-01-13 DIAGNOSIS — E119 Type 2 diabetes mellitus without complications: Secondary | ICD-10-CM

## 2013-01-13 DIAGNOSIS — T753XXA Motion sickness, initial encounter: Secondary | ICD-10-CM

## 2013-01-13 DIAGNOSIS — F3289 Other specified depressive episodes: Secondary | ICD-10-CM

## 2013-01-13 MED ORDER — SCOPOLAMINE 1 MG/3DAYS TD PT72: 1.0000 | MEDICATED_PATCH | TRANSDERMAL | Status: DC

## 2013-01-13 MED ORDER — AZITHROMYCIN 250 MG PO TABS: ORAL_TABLET | ORAL | Status: DC

## 2013-01-13 NOTE — Telephone Encounter (Signed)
Pt calls stating she was just seen and she did not leave with her prescription for Zithromax. I left her a message letting her know this was routed to [facility] on [address].

## 2013-01-13 NOTE — Progress Notes (Signed)
   Subjective:    HPI F/u LBP, neck pain on L - better. The patient presents for a follow-up of  chronic hypertension, chronic dyslipidemia, type 2 diabetes controlled with medicines  Depression is better on Rx C/o bad neck pain 7-9/10 C/o LBP 5-7/10 She had a L wrist/hand surgery   Review of Systems  Constitutional: Negative for chills, activity change, appetite change, fatigue and unexpected weight change.  HENT: Negative for congestion, mouth sores and sinus pressure.   Eyes: Negative for visual disturbance.  Respiratory: Negative for cough and chest tightness.   Gastrointestinal: Positive for constipation. Negative for nausea and abdominal pain.  Genitourinary: Negative for frequency, difficulty urinating and vaginal pain.  Musculoskeletal: Negative for back pain and gait problem.  Skin: Negative for pallor and rash.  Neurological: Negative for dizziness, tremors, weakness, numbness and headaches.  Psychiatric/Behavioral: Negative for suicidal ideas, hallucinations, confusion and sleep disturbance. The patient is nervous/anxious (better, less depressed).      Wt Readings from Last 3 Encounters:  01/13/13 172 lb (78.019 kg)  01/07/13 170 lb (77.111 kg)  09/16/12 173 lb (78.472 kg)   BP Readings from Last 3 Encounters:  01/13/13 130/70  01/07/13 124/78  09/16/12 150/90        Objective:   Physical Exam  Constitutional: She appears well-developed and well-nourished. No distress.  HENT:  Head: Normocephalic.  Right Ear: External ear normal.  Left Ear: External ear normal.  Nose: Nose normal.  Mouth/Throat: Oropharynx is clear and moist.  Eyes: Conjunctivae are normal. Pupils are equal, round, and reactive to light. Right eye exhibits no discharge. Left eye exhibits no discharge.  Neck: Normal range of motion. Neck supple. No JVD present. No tracheal deviation present. No thyromegaly present.  Cardiovascular: Normal rate, regular rhythm and normal heart sounds.    Pulmonary/Chest: No stridor. No respiratory distress. She has no wheezes.  Abdominal: Soft. Bowel sounds are normal. She exhibits no distension and no mass. There is no tenderness. There is no rebound and no guarding.  Musculoskeletal: She exhibits no edema and no tenderness.  Lymphadenopathy:    She has no cervical adenopathy.  Neurological: She displays normal reflexes. No cranial nerve deficit. She exhibits normal muscle tone. Coordination normal.  Mild ataxia Non-focal exam  Skin: No rash noted. No erythema.  Psychiatric: She has a normal mood and affect. Her behavior is normal. Judgment and thought content normal.  Better!   Lab Results  Component Value Date   WBC 11.3* 08/07/2010   HGB 13.6 08/07/2010   HCT 40.0 08/07/2010   PLT 479.0* 08/07/2010   GLUCOSE 135* 08/07/2010   CHOL 116 08/07/2010   TRIG 86.0 08/07/2010   HDL 50.40 08/07/2010   LDLCALC 48 08/07/2010   ALT 29 08/07/2010   AST 33 08/07/2010   NA 135 08/07/2010   K 4.4 08/07/2010   CL 96 08/07/2010   CREATININE 0.8 08/07/2010   BUN 11 08/07/2010   CO2 29 08/07/2010   TSH 1.26 08/07/2010   HGBA1C 7.3* 08/07/2010   MICROALBUR 0.2 11/07/2006          Assessment & Plan:

## 2013-01-14 NOTE — Assessment & Plan Note (Signed)
Continue with current prescription therapy as reflected on the Med list.  

## 2013-01-14 NOTE — Assessment & Plan Note (Signed)
Continue with current prescription therapy as reflected on the Med list. Better 

## 2013-01-14 NOTE — Assessment & Plan Note (Signed)
5/14 [place] Given Scop patch Abx

## 2013-01-19 ENCOUNTER — Encounter: Payer: Self-pay | Admitting: Gynecology

## 2013-01-26 ENCOUNTER — Encounter: Payer: Self-pay | Admitting: Gynecology

## 2013-02-21 ENCOUNTER — Other Ambulatory Visit: Payer: Self-pay | Admitting: Gynecology

## 2013-02-23 DIAGNOSIS — M8580 Other specified disorders of bone density and structure, unspecified site: Secondary | ICD-10-CM

## 2013-02-23 HISTORY — DX: Other specified disorders of bone density and structure, unspecified site: M85.80

## 2013-03-18 ENCOUNTER — Encounter: Payer: Self-pay | Admitting: Gynecology

## 2013-03-18 ENCOUNTER — Ambulatory Visit (INDEPENDENT_AMBULATORY_CARE_PROVIDER_SITE_OTHER): Payer: Medicare Other

## 2013-03-18 DIAGNOSIS — M8580 Other specified disorders of bone density and structure, unspecified site: Secondary | ICD-10-CM

## 2013-03-18 DIAGNOSIS — M899 Disorder of bone, unspecified: Secondary | ICD-10-CM

## 2013-03-31 ENCOUNTER — Telehealth: Payer: Self-pay

## 2013-03-31 ENCOUNTER — Other Ambulatory Visit: Payer: Self-pay

## 2013-03-31 MED ORDER — ESTROGENS, CONJUGATED 0.625 MG/GM VA CREA: TOPICAL_CREAM | VAGINAL | Status: DC

## 2013-03-31 NOTE — Telephone Encounter (Signed)
Okay to refill vaginal Premarin cream apply 2 times weekly refill through May 2015

## 2013-03-31 NOTE — Telephone Encounter (Signed)
Former patient of Dr. Nicholas Lose. She said you have her records.  She said Dr. Nicholas Lose had her using Premarin Vaginal Cream and it has run out. She has been without it for a week due to mail order pharmacy confusion with Dr. Nicholas Lose being gone.  She wants [place] to call it in to local pharmacy for her. Pls advise.

## 2013-03-31 NOTE — Telephone Encounter (Signed)
Rx escribed and patient informed.

## 2013-04-27 ENCOUNTER — Telehealth: Payer: Self-pay | Admitting: Gastroenterology

## 2013-04-27 NOTE — Telephone Encounter (Signed)
Patient experiencing reflux at night and having dysphagia to solid foods.  She will come in and see Mike Gip PA tomorrow at 2:00

## 2013-04-28 ENCOUNTER — Other Ambulatory Visit: Payer: Self-pay | Admitting: Internal Medicine

## 2013-04-28 ENCOUNTER — Encounter: Payer: Self-pay | Admitting: *Deleted

## 2013-04-28 ENCOUNTER — Ambulatory Visit (INDEPENDENT_AMBULATORY_CARE_PROVIDER_SITE_OTHER): Payer: Medicare Other | Admitting: Physician Assistant

## 2013-04-28 VITALS — BP 118/60 | HR 76 | Ht 62.5 in | Wt 179.5 lb

## 2013-04-28 DIAGNOSIS — K219 Gastro-esophageal reflux disease without esophagitis: Secondary | ICD-10-CM

## 2013-04-28 DIAGNOSIS — R1310 Dysphagia, unspecified: Secondary | ICD-10-CM

## 2013-04-28 DIAGNOSIS — R12 Heartburn: Secondary | ICD-10-CM

## 2013-04-28 MED ORDER — DEXLANSOPRAZOLE 60 MG PO CPDR: 60.0000 mg | DELAYED_RELEASE_CAPSULE | Freq: Every day | ORAL | Status: DC

## 2013-04-28 MED ORDER — ALIGN PO CAPS: 1.0000 | ORAL_CAPSULE | Freq: Every day | ORAL | Status: DC

## 2013-04-28 NOTE — Progress Notes (Signed)
Subjective:    Patient ID: [name], female    DOB: May 27, 1941, 72 y.o.   MRN: [id number]  HPI  [name] is a very nice 72 year old white female known to Dr. Russella Dar. She has history of colon polyps and last had colonoscopy in February of 2013. She was found to have one 10 mm polyp in the cecum which was removed and found to be a serrated adenoma. She also has history of chronic GERD and IBS. She is a diabetic, with obesity and hypertension. She had upper endoscopy done in 2004 showed a small hiatal hernia and duodenitis. She has been maintained on Prevacid currently taking over-the-counter with a total of 30 mg per day . She comes in today with complaints of reflux that is poorly controlled. She says she's been having symptoms off and on all day long and has been having episodes of nocturnal reflux which she says are very frightening because she wakes up choking. She says her symptoms have been worse over the past few months with frequent sour brash. She has also noted intermittent solid food dysphagia. She says usually if she stops eating and drinks some water the food will go on down. She has not been having episodes of regurgitation. He says she has had a couple recent episodes of nocturnal reflux waking from sleep choking and with fluid in her mouth and a sensation that she can't breathe. She says once she gets up out of bed intense that up fluid she will have some dry heaves and coughing and then has to sit up for a few hours after that is no current complaints of abdominal pain or changes in bowel habits.    Review of Systems  Constitutional: Negative.   HENT: Positive for trouble swallowing.   Eyes: Negative.   Cardiovascular: Negative.   Gastrointestinal: Positive for constipation.  Endocrine: Negative.   Genitourinary: Negative.   Musculoskeletal: Positive for arthralgias.  Skin: Negative.   Allergic/Immunologic: Negative.   Neurological: Negative.   Hematological: Negative.    Psychiatric/Behavioral: Negative.    Outpatient Prescriptions Prior to Visit  Medication Sig Dispense Refill  . aspirin 81 MG EC tablet Take 81 mg by mouth daily.        Marland Kitchen b complex vitamins tablet Take 1 tablet by mouth daily.        . cetirizine (ZYRTEC) 10 MG tablet Take 10 mg by mouth daily as needed.        . conjugated estrogens (PREMARIN) vaginal cream Place vaginally 2 (two) times a week.  42.5 g  9  . desvenlafaxine (PRISTIQ) 50 MG 24 hr tablet Take 100 mg by mouth daily.       Marland Kitchen estradiol (VIVELLE-DOT) 0.0375 MG/24HR Place 1 patch onto the skin 2 (two) times a week.       . furosemide (LASIX) 20 MG tablet Take 1 tablet (20 mg total) by mouth every morning.  90 tablet  3  . insulin glargine (LANTUS) 100 UNIT/ML injection Inject 10 Units into the skin daily.       . lansoprazole (PREVACID) 30 MG capsule Take 30 mg by mouth daily.        . Liraglutide (VICTOZA) 18 MG/3ML SOLN Inject into the skin daily.        Marland Kitchen LORazepam (ATIVAN) 0.5 MG tablet Take 0.5 mg by mouth as directed. 0.5 mg by mouth every morning. And 1mg  at bedtime      . lovastatin (MEVACOR) 20 MG tablet Take 1 tablet (20  mg total) by mouth daily.  90 tablet  3  . metFORMIN (GLUCOPHAGE-XR) 500 MG 24 hr tablet Take 1,000 mg by mouth 2 (two) times daily.        . Multiple Vitamins-Minerals (MULTIVITAMIN,TX-MINERALS) tablet Take 1 tablet by mouth daily.        . pioglitazone (ACTOS) 45 MG tablet Take 45 mg by mouth daily.        . Testosterone Propionate 2 % CREA Place 4 % onto the skin daily.       Marland Kitchen amoxicillin-clavulanate (AUGMENTIN) 875-125 MG per tablet Take 1 tablet by mouth 2 (two) times daily. Prn high fever      . azithromycin (ZITHROMAX Z-PAK) 250 MG tablet As directed  6 each  0  . buPROPion (WELLBUTRIN SR) 100 MG 12 hr tablet Take 100 mg by mouth daily.      . Cholecalciferol 1000 UNITS tablet Take 1,000 Units by mouth 3 (three) times a week.       . fluconazole (DIFLUCAN) 150 MG tablet Take 1 tablet (150 mg  total) by mouth once.  1 tablet  0  . lubiprostone (AMITIZA) 24 MCG capsule Take 24 mcg by mouth 2 (two) times daily with a meal.      . NON FORMULARY 1 application 2 (two) times a week. Premarin 1 gm 1 application once daily.      Marland Kitchen scopolamine (TRANSDERM-SCOP) 1.5 MG Place 1 patch (1.5 mg total) onto the skin every 3 (three) days.  4 patch  1   No facility-administered medications prior to visit.   Allergies  Allergen Reactions  . Codeine   . Hydrocodone     Feeling funny  . Pravastatin Sodium    Patient Active Problem List   Diagnosis Date Noted  . Traveling 01/14/2013  . Fall 09/16/2012  . Neck pain 09/16/2012  . LBP (low back pain) 09/16/2012  . Well adult exam 07/01/2012  . Acute bronchitis 05/21/2011  . Constipation 04/10/2011  . FATIGUE 08/07/2010  . PARESTHESIA 08/07/2010  . DYSPHAGIA 07/13/2009  . IRRITABLE BOWEL SYNDROME 09/24/2007  . LIVER FUNCTION TESTS, ABNORMAL 09/24/2007  . DIABETES MELLITUS, TYPE II 06/26/2007  . ANXIETY 06/26/2007  . DEPRESSION 06/26/2007  . HYPERTENSION 06/26/2007  . GERD 06/26/2007  . OSTEOARTHRITIS 06/26/2007  . COLONIC POLYPS, HX OF 06/26/2007  . SPLENECTOMY, TOTAL, HX OF 06/26/2007   History  Substance Use Topics  . Smoking status: Never Smoker   . Smokeless tobacco: Never Used  . Alcohol Use: Yes     Comment: very rare   family history includes Breast cancer (age of onset: 39) in her sister; Colon cancer (age of onset: 77) in her mother; Colon polyps in her mother; Diabetes in her father.     Objective:   Physical Exam  well-developed older white female in no acute distress, pleasant. HEENT; nontraumatic normocephalic EOMI PERRLA sclera anicteric, Supple; no JVD, Cardiovascular; regular rate and rhythm with S1-S2 no murmur or gallop, Pulmonary ;clear bilaterally, Abdomen; soft nondistended nontender no palpable mass or hepatosplenomegaly bowel sounds are active, Rectal; exam not done, Extremities ;no clubbing cyanosis or edema  skin warm and dry, Psych; mood and affect normal and appropriate        Assessment & Plan:  #36 72 year old female with history of chronic GERD currently poorly controlled with frequent episodes of reflux, including nocturnal episodes of reflux with choking and new onset of intermittent solid food dysphagia #2 IBS #3 history of adenomatous colon polyps, up-to-date with colonoscopy  last done February 2013 #4 status post cholecystectomy and splenectomy #5 diabetes mellitus #6 obesity #7 hypertension Plan; will switch patient to Dexilant  60 mg by mouth every morning Long discussion regarding antireflux regiment,antireflux diet- in addition encouraged her to be n.p.o. for 2-3 hours before bedtime and elevate the head of her bed at least 45 at night to prevent nocturnal reflux Will schedule for upper endoscopy with Dr. Russella Dar with possible esophageal dilation. Procedure was discussed in detail with the patient and she is agreeable to proceed. Will also add probiotics on a daily basis, to help with IBS symptoms of gas and bloating area she is encouraged to alternate probiotics

## 2013-04-28 NOTE — Patient Instructions (Addendum)

## 2013-04-28 NOTE — Progress Notes (Signed)
Reviewed and agree with management plan.  Erryn Dickison T. Cheyenna Pankowski, MD FACG 

## 2013-05-01 ENCOUNTER — Other Ambulatory Visit: Payer: Self-pay | Admitting: Internal Medicine

## 2013-05-11 ENCOUNTER — Ambulatory Visit (INDEPENDENT_AMBULATORY_CARE_PROVIDER_SITE_OTHER): Payer: Medicare Other | Admitting: Internal Medicine

## 2013-05-11 ENCOUNTER — Encounter: Payer: Self-pay | Admitting: Internal Medicine

## 2013-05-11 VITALS — BP 138/86 | HR 80 | Temp 97.1°F | Resp 16 | Wt 179.0 lb

## 2013-05-11 DIAGNOSIS — Z23 Encounter for immunization: Secondary | ICD-10-CM

## 2013-05-11 DIAGNOSIS — E119 Type 2 diabetes mellitus without complications: Secondary | ICD-10-CM

## 2013-05-11 DIAGNOSIS — R609 Edema, unspecified: Secondary | ICD-10-CM | POA: Insufficient documentation

## 2013-05-11 MED ORDER — LORCASERIN HCL 10 MG PO TABS: 1.0000 | ORAL_TABLET | Freq: Two times a day (BID) | ORAL | Status: DC

## 2013-05-11 NOTE — Patient Instructions (Signed)
[email or web address] Compression socks, elevate feet Actos 1/2 a day

## 2013-05-11 NOTE — Assessment & Plan Note (Signed)
9/14 LE _ multifactorial Reudce Actos

## 2013-05-11 NOTE — Progress Notes (Signed)
   Subjective:    HPI  C/o leg swelling x weeks  F/u LBP, neck pain on L - better. The patient presents for a follow-up of  chronic hypertension, chronic dyslipidemia, type 2 diabetes controlled with medicines  Depression is better on Rx C/o bad neck pain 7-9/10 C/o LBP 5-7/10 She had a L wrist/hand surgery   Review of Systems  Constitutional: Negative for chills, activity change, appetite change, fatigue and unexpected weight change.  HENT: Negative for congestion, mouth sores and sinus pressure.   Eyes: Negative for visual disturbance.  Respiratory: Negative for cough and chest tightness.   Cardiovascular: Positive for leg swelling.  Gastrointestinal: Positive for constipation. Negative for nausea and abdominal pain.  Genitourinary: Negative for frequency, difficulty urinating and vaginal pain.  Musculoskeletal: Negative for back pain and gait problem.  Skin: Negative for pallor and rash.  Neurological: Negative for dizziness, tremors, weakness, numbness and headaches.  Psychiatric/Behavioral: Negative for suicidal ideas, hallucinations, confusion and sleep disturbance. The patient is nervous/anxious (better, less depressed).      Wt Readings from Last 3 Encounters:  05/11/13 179 lb (81.194 kg)  04/28/13 179 lb 8 oz (81.421 kg)  01/13/13 172 lb (78.019 kg)   BP Readings from Last 3 Encounters:  05/11/13 168/100  04/28/13 118/60  01/13/13 130/70        Objective:   Physical Exam  Constitutional: She appears well-developed and well-nourished. No distress.  HENT:  Head: Normocephalic.  Right Ear: External ear normal.  Left Ear: External ear normal.  Nose: Nose normal.  Mouth/Throat: Oropharynx is clear and moist.  Eyes: Conjunctivae are normal. Pupils are equal, round, and reactive to light. Right eye exhibits no discharge. Left eye exhibits no discharge.  Neck: Normal range of motion. Neck supple. No JVD present. No tracheal deviation present. No thyromegaly  present.  Cardiovascular: Normal rate, regular rhythm and normal heart sounds.   Pulmonary/Chest: No stridor. No respiratory distress. She has no wheezes.  Abdominal: Soft. Bowel sounds are normal. She exhibits no distension and no mass. There is no tenderness. There is no rebound and no guarding.  Musculoskeletal: She exhibits edema (trace). She exhibits no tenderness.  Lymphadenopathy:    She has no cervical adenopathy.  Neurological: She displays normal reflexes. No cranial nerve deficit. She exhibits normal muscle tone. Coordination normal.  Mild ataxia Non-focal exam  Skin: No rash noted. No erythema.  Psychiatric: She has a normal mood and affect. Her behavior is normal. Judgment and thought content normal.  Better!   Lab Results  Component Value Date   WBC 11.3* 08/07/2010   HGB 13.6 08/07/2010   HCT 40.0 08/07/2010   PLT 479.0* 08/07/2010   GLUCOSE 135* 08/07/2010   CHOL 116 08/07/2010   TRIG 86.0 08/07/2010   HDL 50.40 08/07/2010   LDLCALC 48 08/07/2010   ALT 29 08/07/2010   AST 33 08/07/2010   NA 135 08/07/2010   K 4.4 08/07/2010   CL 96 08/07/2010   CREATININE 0.8 08/07/2010   BUN 11 08/07/2010   CO2 29 08/07/2010   TSH 1.26 08/07/2010   HGBA1C 7.3* 08/07/2010   MICROALBUR 0.2 11/07/2006          Assessment & Plan:

## 2013-05-15 ENCOUNTER — Encounter: Payer: Self-pay | Admitting: Internal Medicine

## 2013-05-15 NOTE — Assessment & Plan Note (Signed)
Will reduce Actos dose in 1/2

## 2013-05-18 ENCOUNTER — Ambulatory Visit: Payer: Medicare Other | Admitting: Internal Medicine

## 2013-06-09 ENCOUNTER — Encounter: Payer: Self-pay | Admitting: Podiatry

## 2013-06-09 ENCOUNTER — Ambulatory Visit (INDEPENDENT_AMBULATORY_CARE_PROVIDER_SITE_OTHER): Payer: Medicare Other | Admitting: Podiatry

## 2013-06-09 VITALS — BP 138/76 | HR 92 | Resp 16 | Ht 62.0 in | Wt 175.0 lb

## 2013-06-09 DIAGNOSIS — G5760 Lesion of plantar nerve, unspecified lower limb: Secondary | ICD-10-CM

## 2013-06-09 DIAGNOSIS — M7750 Other enthesopathy of unspecified foot: Secondary | ICD-10-CM

## 2013-06-09 DIAGNOSIS — E1159 Type 2 diabetes mellitus with other circulatory complications: Secondary | ICD-10-CM

## 2013-06-09 NOTE — Patient Instructions (Signed)
Take pills a day

## 2013-06-10 NOTE — Progress Notes (Signed)
Subjective:     Patient ID: [name], female   DOB: 03/12/1941, 72 y.o.   MRN: [id number]  Foot Pain   patient states that the tingling seems a little bit better with supportive stockings soaks and arch supports. States that the Neurontin did not do well for her and she developed dizziness and cannot take   Review of Systems  All other systems reviewed and are negative.       Objective:   Physical Exam  Nursing note and vitals reviewed. Cardiovascular: Intact distal pulses.   Musculoskeletal: Normal range of motion.  Neurological: She is alert.  Skin: Skin is warm.   patient's distal feet are showing some improvement with the reduction of discomfort and tingle     Assessment:     Gradual improvement of feet with compression and support    Plan:     We're going to add neuro remedy height vitamin B complex in order to try to help remaining symptoms. Educated her on neuro remedy and use

## 2013-06-22 ENCOUNTER — Ambulatory Visit (AMBULATORY_SURGERY_CENTER): Payer: Medicare Other | Admitting: Gastroenterology

## 2013-06-22 ENCOUNTER — Encounter: Payer: Self-pay | Admitting: Gastroenterology

## 2013-06-22 VITALS — BP 158/84 | HR 86 | Temp 97.2°F | Resp 31 | Ht 62.5 in | Wt 179.0 lb

## 2013-06-22 DIAGNOSIS — K219 Gastro-esophageal reflux disease without esophagitis: Secondary | ICD-10-CM

## 2013-06-22 DIAGNOSIS — R1319 Other dysphagia: Secondary | ICD-10-CM

## 2013-06-22 MED ORDER — SODIUM CHLORIDE 0.9 % IV SOLN: 500.0000 mL | INTRAVENOUS | Status: DC

## 2013-06-22 NOTE — Progress Notes (Signed)
Called to room to assist during endoscopic procedure.  Patient ID and intended procedure confirmed with present staff. Received instructions for my participation in the procedure from the performing physician.  

## 2013-06-22 NOTE — Op Note (Signed)
[facility] [address] [place], [address]   ENDOSCOPY PROCEDURE REPORT  PATIENT: Cheryl, Owen  MR#: [id number] BIRTHDATE: 1941-06-21 , 71  yrs. old GENDER: Female ENDOSCOPIST: Meryl Dare, MD, [facility] PROCEDURE DATE:  06/22/2013 PROCEDURE:  EGD, diagnostic and Savary dilation of esophagus ASA CLASS:     Class II INDICATIONS:  Dysphagia.   History of esophageal reflux. MEDICATIONS: MAC sedation, administered by CRNA and propofol (Diprivan) 140mg  IV TOPICAL ANESTHETIC: none DESCRIPTION OF PROCEDURE: After the risks benefits and alternatives of the procedure were thoroughly explained, informed consent was obtained.  The    endoscope was introduced through the mouth and advanced to the second portion of the duodenum  without limitations.  The instrument was slowly withdrawn as the mucosa was fully examined.  ESOPHAGUS: The mucosa of the esophagus appeared normal. STOMACH: The mucosa and folds of the stomach appeared normal. DUODENUM: The duodenal mucosa showed no abnormalities in the bulb and second portion of the duodenum.  Retroflexed views revealed a 4 cm hiatal hernia.  A guidewire was placed and the scope was then withdrawn from the patient. A 17 mm Savary dilator was passed over the guidewire with no resistance and no heme noted for dysphagia without a stricture noted. The dilator and guidewire were removed from the patient and the procedure completed.  COMPLICATIONS: There were no complications.  ENDOSCOPIC IMPRESSION: 1.   Small hiatal hernia 2.   The EGD otherwise appeared normal  RECOMMENDATIONS: 1.  Anti-reflux regimen long term 2.  Continue PPI long term 3.  Post dilation instructions  eSigned:  Meryl Dare, MD, [facility] 06/22/2013 8:53 AM

## 2013-06-22 NOTE — Progress Notes (Signed)
A/ox3 pleased with MAC, report to Wendy RN 

## 2013-06-22 NOTE — Patient Instructions (Signed)
YOU HAD AN ENDOSCOPIC PROCEDURE TODAY AT THE [facility]: Refer to the procedure report that was given to you for any specific questions about what was found during the examination.  If the procedure report does not answer your questions, please call your gastroenterologist to clarify.  If you requested that your care partner not be given the details of your procedure findings, then the procedure report has been included in a sealed envelope for you to review at your convenience later.  YOU SHOULD EXPECT: Some feelings of bloating in the abdomen. Passage of more gas than usual.  Walking can help get rid of the air that was put into your GI tract during the procedure and reduce the bloating. If you had a lower endoscopy (such as a colonoscopy or flexible sigmoidoscopy) you may notice spotting of blood in your stool or on the toilet paper. If you underwent a bowel prep for your procedure, then you may not have a normal bowel movement for a few days.  DIET: Your first meal following the procedure should be a light meal and then it is ok to progress to your normal diet.  A half-sandwich or bowl of soup is an example of a good first meal.  Heavy or fried foods are harder to digest and may make you feel nauseous or bloated.  Likewise meals heavy in dairy and vegetables can cause extra gas to form and this can also increase the bloating.  Drink plenty of fluids but you should avoid alcoholic beverages for 24 hours.  ACTIVITY: Your care partner should take you home directly after the procedure.  You should plan to take it easy, moving slowly for the rest of the day.  You can resume normal activity the day after the procedure however you should NOT DRIVE or use heavy machinery for 24 hours (because of the sedation medicines used during the test).    SYMPTOMS TO REPORT IMMEDIATELY: A gastroenterologist can be reached at any hour.  During normal business hours, 8:30 AM to 5:00 PM Monday through Friday,  call [phone].  After hours and on weekends, please call the GI answering service at [phone] who will take a message and have the physician on call contact you.  Following upper endoscopy (EGD)  Vomiting of blood or coffee ground material  New chest pain or pain under the shoulder blades  Painful or persistently difficult swallowing  New shortness of breath  Fever of 100F or higher  Black, tarry-looking stools  FOLLOW UP: If any biopsies were taken you will be contacted by phone or by letter within the next 1-3 weeks.  Call your gastroenterologist if you have not heard about the biopsies in 3 weeks.  Our staff will call the home number listed on your records the next business day following your procedure to check on you and address any questions or concerns that you may have at that time regarding the information given to you following your procedure. This is a courtesy call and so if there is no answer at the home number and we have not heard from you through the emergency physician on call, we will assume that you have returned to your regular daily activities without incident.  SIGNATURES/CONFIDENTIALITY: You and/or your care partner have signed paperwork which will be entered into your electronic medical record.  These signatures attest to the fact that that the information above on your After Visit Summary has been reviewed and is understood.  Full responsibility of   the confidentiality of this discharge information lies with you and/or your care-partner. 

## 2013-06-22 NOTE — Progress Notes (Signed)
Patient did not have preoperative order for IV antibiotic SSI prophylaxis. (G8918)  Patient did not experience any of the following events: a burn prior to discharge; a fall within the facility; wrong site/side/patient/procedure/implant event; or a hospital transfer or hospital admission upon discharge from the facility. (G8907)  

## 2013-06-23 ENCOUNTER — Telehealth: Payer: Self-pay

## 2013-06-23 NOTE — Telephone Encounter (Signed)
  Follow up Call-  Call back number 06/22/2013 10/02/2011  Post procedure Call Back phone  # [phone]  Permission to leave phone message Yes Yes     Patient questions:  Do you have a fever, pain , or abdominal swelling? no Pain Score  0 *  Have you tolerated food without any problems? yes  Have you been able to return to your normal activities? yes  Do you have any questions about your discharge instructions: Diet   no Medications  no Follow up visit  no  Do you have questions or concerns about your Care? no  Actions: * If pain score is 4 or above: No action needed, pain <4.  The pt was asleep.  I spoke with her husband and he said she did "fine". Maw

## 2013-07-08 ENCOUNTER — Ambulatory Visit (INDEPENDENT_AMBULATORY_CARE_PROVIDER_SITE_OTHER): Payer: Medicare Other | Admitting: Podiatry

## 2013-07-08 ENCOUNTER — Encounter: Payer: Self-pay | Admitting: Podiatry

## 2013-07-08 VITALS — BP 140/78 | HR 88 | Resp 16 | Ht 62.0 in | Wt 175.0 lb

## 2013-07-08 DIAGNOSIS — E1149 Type 2 diabetes mellitus with other diabetic neurological complication: Secondary | ICD-10-CM

## 2013-07-08 DIAGNOSIS — M7750 Other enthesopathy of unspecified foot: Secondary | ICD-10-CM

## 2013-07-11 NOTE — Progress Notes (Signed)
Subjective:     Patient ID: [name], female   DOB: 29-Dec-1940, 72 y.o.   MRN: [id number]  HPI patient presents stating they seem a little better with medications but I'm still getting burning in my feet   Review of Systems     Objective:   Physical Exam  Constitutional: She is oriented to person, place, and time.  Cardiovascular: Intact distal pulses.   Musculoskeletal: Normal range of motion.  Neurological: She is oriented to person, place, and time.  Skin: Skin is warm.   Patient continues to experience diminishment of Charcot vibratory with moderate signs of neuropathic changes    Assessment:    changes consistent with neuropathy with no indications of progression of disease and unable to take Neurontin the tolerating neuro remedy    Plan:    doing well with neuro remedy but did discuss possibilities of other treatments at one time in future reviewed different options we may have for this patient

## 2013-07-13 ENCOUNTER — Telehealth: Payer: Self-pay | Admitting: *Deleted

## 2013-07-13 MED ORDER — ESTRADIOL 0.0375 MG/24HR TD PTWK: 0.0375 mg | MEDICATED_PATCH | TRANSDERMAL | Status: DC

## 2013-07-13 NOTE — Telephone Encounter (Signed)
we talked about the risks of hormone replacement at her visit in May to include stroke heart attack deep venous thromboses and possible breast cancer risk. As long as patient is willing to accept those risks then we can try a generic patch 0.0375 mg weekly refill through May 2015

## 2013-07-13 NOTE — Telephone Encounter (Signed)
Pt calling to follow up from [facility] 01/07/13 regarding HRT. She would like to start back on something, pt said she just doesn't feel right since being off HRT. No hot flashes or other isses , pt said that insurance will not pay for vivelle-dot patch unsure if other patches are covered. Please advise

## 2013-07-13 NOTE — Telephone Encounter (Signed)
Pt is aware of the below note, rx sent.

## 2013-07-14 ENCOUNTER — Encounter (HOSPITAL_BASED_OUTPATIENT_CLINIC_OR_DEPARTMENT_OTHER): Payer: Self-pay | Admitting: *Deleted

## 2013-07-14 NOTE — Progress Notes (Signed)
Pt is out of town until eve preop-will need [address] and ekg

## 2013-07-15 ENCOUNTER — Ambulatory Visit: Payer: Medicare Other | Admitting: Podiatry

## 2013-07-15 ENCOUNTER — Other Ambulatory Visit: Payer: Self-pay | Admitting: Orthopedic Surgery

## 2013-07-16 ENCOUNTER — Encounter (HOSPITAL_BASED_OUTPATIENT_CLINIC_OR_DEPARTMENT_OTHER): Payer: Self-pay | Admitting: *Deleted

## 2013-07-16 ENCOUNTER — Ambulatory Visit (HOSPITAL_BASED_OUTPATIENT_CLINIC_OR_DEPARTMENT_OTHER): Payer: Medicare Other | Admitting: Anesthesiology

## 2013-07-16 ENCOUNTER — Encounter (HOSPITAL_BASED_OUTPATIENT_CLINIC_OR_DEPARTMENT_OTHER): Payer: Medicare Other | Admitting: Anesthesiology

## 2013-07-16 ENCOUNTER — Ambulatory Visit (HOSPITAL_BASED_OUTPATIENT_CLINIC_OR_DEPARTMENT_OTHER)
Admission: RE | Admit: 2013-07-16 | Discharge: 2013-07-16 | Disposition: A | Discharge status: Home / Self Care | Payer: Medicare Other | Source: Ambulatory Visit | Attending: Orthopedic Surgery | Admitting: Orthopedic Surgery

## 2013-07-16 ENCOUNTER — Encounter (HOSPITAL_BASED_OUTPATIENT_CLINIC_OR_DEPARTMENT_OTHER)
Admission: RE | Disposition: A | Discharge status: Home / Self Care | Payer: Self-pay | Source: Ambulatory Visit | Attending: Orthopedic Surgery

## 2013-07-16 DIAGNOSIS — E785 Hyperlipidemia, unspecified: Secondary | ICD-10-CM | POA: Insufficient documentation

## 2013-07-16 DIAGNOSIS — K219 Gastro-esophageal reflux disease without esophagitis: Secondary | ICD-10-CM | POA: Insufficient documentation

## 2013-07-16 DIAGNOSIS — M2240 Chondromalacia patellae, unspecified knee: Secondary | ICD-10-CM | POA: Insufficient documentation

## 2013-07-16 DIAGNOSIS — M1710 Unilateral primary osteoarthritis, unspecified knee: Secondary | ICD-10-CM | POA: Insufficient documentation

## 2013-07-16 DIAGNOSIS — E119 Type 2 diabetes mellitus without complications: Secondary | ICD-10-CM | POA: Insufficient documentation

## 2013-07-16 DIAGNOSIS — M23302 Other meniscus derangements, unspecified lateral meniscus, unspecified knee: Secondary | ICD-10-CM | POA: Insufficient documentation

## 2013-07-16 HISTORY — PX: KNEE ARTHROSCOPY WITH LATERAL MENISECTOMY: SHX6193

## 2013-07-16 HISTORY — DX: Localized edema: R60.0

## 2013-07-16 LAB — POCT I-STAT, CHEM 8
BUN: 16 mg/dL (ref 6–23)
Calcium, Ion: 1.03 mmol/L — ABNORMAL LOW (ref 1.13–1.30)
Glucose, Bld: 124 mg/dL — ABNORMAL HIGH (ref 70–99)
HCT: 37 % (ref 36.0–46.0)
Potassium: 3.9 mEq/L (ref 3.5–5.1)
TCO2: 22 mmol/L (ref 0–100)

## 2013-07-16 LAB — GLUCOSE, CAPILLARY: Glucose-Capillary: 120 mg/dL — ABNORMAL HIGH (ref 70–99)

## 2013-07-16 SURGERY — ARTHROSCOPY, KNEE, WITH LATERAL MENISCECTOMY
Anesthesia: General | Site: Knee | Laterality: Right | Wound class: Clean

## 2013-07-16 MED ORDER — METHYLPREDNISOLONE ACETATE 80 MG/ML IJ SUSP
INTRAMUSCULAR | Status: AC
  Filled 2013-07-16: qty 1

## 2013-07-16 MED ORDER — SODIUM CHLORIDE 0.9 % IV SOLN: INTRAVENOUS | Status: DC

## 2013-07-16 MED ORDER — HYDROMORPHONE HCL PF 1 MG/ML IJ SOLN
0.2500 mg | INTRAMUSCULAR | Status: DC | PRN
  Administered 2013-07-16 (×4): 0.5 mg via INTRAVENOUS

## 2013-07-16 MED ORDER — MIDAZOLAM HCL 2 MG/2ML IJ SOLN: 1.0000 mg | INTRAMUSCULAR | Status: DC | PRN

## 2013-07-16 MED ORDER — LACTATED RINGERS IV SOLN
INTRAVENOUS | Status: DC
  Administered 2013-07-16 (×2): via INTRAVENOUS

## 2013-07-16 MED ORDER — ONDANSETRON HCL 4 MG/2ML IJ SOLN: 4.0000 mg | Freq: Once | INTRAMUSCULAR | Status: DC | PRN

## 2013-07-16 MED ORDER — CHLORHEXIDINE GLUCONATE 4 % EX LIQD: 60.0000 mL | Freq: Once | CUTANEOUS | Status: DC

## 2013-07-16 MED ORDER — HYDROMORPHONE HCL PF 1 MG/ML IJ SOLN
INTRAMUSCULAR | Status: AC
  Filled 2013-07-16: qty 1

## 2013-07-16 MED ORDER — PROPOFOL 10 MG/ML IV BOLUS
INTRAVENOUS | Status: DC | PRN
  Administered 2013-07-16: 150 mg via INTRAVENOUS

## 2013-07-16 MED ORDER — LIDOCAINE HCL (CARDIAC) 20 MG/ML IV SOLN
INTRAVENOUS | Status: DC | PRN
  Administered 2013-07-16: 100 mg via INTRAVENOUS

## 2013-07-16 MED ORDER — BUPIVACAINE HCL (PF) 0.5 % IJ SOLN
INTRAMUSCULAR | Status: DC | PRN
  Administered 2013-07-16: 20 mL

## 2013-07-16 MED ORDER — SODIUM CHLORIDE 0.9 % IR SOLN
Status: DC | PRN
  Administered 2013-07-16: 3000 mL

## 2013-07-16 MED ORDER — FENTANYL CITRATE 0.05 MG/ML IJ SOLN
INTRAMUSCULAR | Status: AC
  Filled 2013-07-16: qty 4

## 2013-07-16 MED ORDER — OXYCODONE HCL 5 MG PO TABS: 5.0000 mg | ORAL_TABLET | Freq: Once | ORAL | Status: DC | PRN

## 2013-07-16 MED ORDER — CEFAZOLIN SODIUM-DEXTROSE 2-3 GM-% IV SOLR
INTRAVENOUS | Status: AC
  Filled 2013-07-16: qty 50

## 2013-07-16 MED ORDER — BUPIVACAINE HCL (PF) 0.5 % IJ SOLN
INTRAMUSCULAR | Status: AC
  Filled 2013-07-16: qty 30

## 2013-07-16 MED ORDER — ONDANSETRON HCL 4 MG/2ML IJ SOLN
INTRAMUSCULAR | Status: DC | PRN
  Administered 2013-07-16: 4 mg via INTRAVENOUS

## 2013-07-16 MED ORDER — BUPIVACAINE HCL (PF) 0.25 % IJ SOLN
INTRAMUSCULAR | Status: AC
  Filled 2013-07-16: qty 30

## 2013-07-16 MED ORDER — MIDAZOLAM HCL 2 MG/2ML IJ SOLN
INTRAMUSCULAR | Status: AC
  Filled 2013-07-16: qty 2

## 2013-07-16 MED ORDER — FENTANYL CITRATE 0.05 MG/ML IJ SOLN
INTRAMUSCULAR | Status: DC | PRN
Start: 2013-07-16 — End: 2013-07-16
  Administered 2013-07-16: 100 ug via INTRAVENOUS
  Administered 2013-07-16: 50 ug via INTRAVENOUS

## 2013-07-16 MED ORDER — METHYLPREDNISOLONE ACETATE 80 MG/ML IJ SUSP
INTRAMUSCULAR | Status: DC | PRN
  Administered 2013-07-16: 80 mg

## 2013-07-16 MED ORDER — CEFAZOLIN SODIUM-DEXTROSE 2-3 GM-% IV SOLR
2.0000 g | INTRAVENOUS | Status: AC
  Administered 2013-07-16: 2 g via INTRAVENOUS

## 2013-07-16 MED ORDER — MEPERIDINE HCL 25 MG/ML IJ SOLN: 6.2500 mg | INTRAMUSCULAR | Status: DC | PRN

## 2013-07-16 MED ORDER — MIDAZOLAM HCL 5 MG/5ML IJ SOLN
INTRAMUSCULAR | Status: DC | PRN
  Administered 2013-07-16: 2 mg via INTRAVENOUS

## 2013-07-16 MED ORDER — FENTANYL CITRATE 0.05 MG/ML IJ SOLN: 50.0000 ug | INTRAMUSCULAR | Status: DC | PRN

## 2013-07-16 MED ORDER — OXYCODONE HCL 5 MG/5ML PO SOLN: 5.0000 mg | Freq: Once | ORAL | Status: DC | PRN

## 2013-07-16 SURGICAL SUPPLY — 40 items
BANDAGE ELASTIC 6 VELCRO ST LF (GAUZE/BANDAGES/DRESSINGS) ×2 IMPLANT
BLADE CUDA 5.5 (BLADE) IMPLANT
BLADE CUDA GRT WHITE 3.5 (BLADE) IMPLANT
BLADE CUTTER GATOR 3.5 (BLADE) ×2 IMPLANT
BLADE CUTTER MENIS 5.5 (BLADE) IMPLANT
BLADE GREAT WHITE 4.2 (BLADE) ×2 IMPLANT
BUR OVAL 4.0 (BURR) IMPLANT
CANISTER SUCT 3000ML (MISCELLANEOUS) IMPLANT
CANISTER SUCT LVC 12 LTR MEDI- (MISCELLANEOUS) ×2 IMPLANT
CUTTER MENISCUS  4.2MM (BLADE)
CUTTER MENISCUS 4.2MM (BLADE) IMPLANT
DRAPE ARTHROSCOPY W/POUCH 90 (DRAPES) ×2 IMPLANT
DRSG PAD ABDOMINAL 8X10 ST (GAUZE/BANDAGES/DRESSINGS) ×1 IMPLANT
DURAPREP 26ML APPLICATOR (WOUND CARE) ×2 IMPLANT
ELECT MENISCUS 165MM 90D (ELECTRODE) IMPLANT
ELECT REM PT RETURN 9FT ADLT (ELECTROSURGICAL)
ELECTRODE REM PT RTRN 9FT ADLT (ELECTROSURGICAL) IMPLANT
GAUZE XEROFORM 1X8 LF (GAUZE/BANDAGES/DRESSINGS) ×2 IMPLANT
GLOVE BIO SURGEON STRL SZ 6.5 (GLOVE) ×1 IMPLANT
GLOVE BIO SURGEON STRL SZ8 (GLOVE) ×2 IMPLANT
GLOVE BIOGEL PI IND STRL 7.0 (GLOVE) IMPLANT
GLOVE BIOGEL PI IND STRL 8.5 (GLOVE) ×1 IMPLANT
GLOVE BIOGEL PI INDICATOR 7.0 (GLOVE) ×1
GLOVE BIOGEL PI INDICATOR 8.5 (GLOVE) ×1
GLOVE ORTHO TXT STRL SZ7.5 (GLOVE) ×3 IMPLANT
GOWN BRE IMP PREV XXLGXLNG (GOWN DISPOSABLE) ×2 IMPLANT
GOWN PREVENTION PLUS XLARGE (GOWN DISPOSABLE) ×3 IMPLANT
HOLDER KNEE FOAM BLUE (MISCELLANEOUS) ×2 IMPLANT
IV NS IRRIG 3000ML ARTHROMATIC (IV SOLUTION) ×4 IMPLANT
KNEE WRAP E Z 3 GEL PACK (MISCELLANEOUS) ×1 IMPLANT
PACK ARTHROSCOPY DSU (CUSTOM PROCEDURE TRAY) ×2 IMPLANT
PACK BASIN DAY SURGERY FS (CUSTOM PROCEDURE TRAY) ×2 IMPLANT
PENCIL BUTTON HOLSTER BLD 10FT (ELECTRODE) IMPLANT
SET ARTHROSCOPY TUBING (MISCELLANEOUS) ×2
SET ARTHROSCOPY TUBING LN (MISCELLANEOUS) ×1 IMPLANT
SPONGE GAUZE 4X4 12PLY (GAUZE/BANDAGES/DRESSINGS) ×5 IMPLANT
SUT ETHILON 3 0 PS 1 (SUTURE) ×2 IMPLANT
SUT VIC AB 3-0 FS2 27 (SUTURE) IMPLANT
TOWEL OR 17X24 6PK STRL BLUE (TOWEL DISPOSABLE) ×2 IMPLANT
WATER STERILE IRR 1000ML POUR (IV SOLUTION) ×2 IMPLANT

## 2013-07-16 NOTE — Anesthesia Postprocedure Evaluation (Signed)
Anesthesia Post Note  Patient: [name]  Procedure(s) Performed: Procedure(s) (LRB): RIGHT KNEE ARTHROSCOPY WITH LATERAL MENISCECTOMY, and Chondroplasty (Right)  Anesthesia type: general  Patient location: PACU  Post pain: Pain level controlled  Post assessment: Patient's Cardiovascular Status Stable  Last Vitals:  Filed Vitals:   07/16/13 1600  BP: 132/65  Pulse: 103  Temp:   Resp: 15    Post vital signs: Reviewed and stable  Level of consciousness: sedated  Complications: No apparent anesthesia complications
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
## 2013-07-16 NOTE — Anesthesia Preprocedure Evaluation (Signed)
Anesthesia Evaluation  Patient identified by MRN, date of birth, ID band Patient awake    Reviewed: Allergy & Precautions, H&P , NPO status , Patient's Chart, lab work & pertinent test results  Airway Mallampati: I TM Distance: >3 FB Neck ROM: Full    Dental   Pulmonary          Cardiovascular hypertension, Pt. on medications     Neuro/Psych    GI/Hepatic GERD-  Medicated and Controlled,  Endo/Other  diabetes, Type 2, Oral Hypoglycemic Agents  Renal/GU      Musculoskeletal   Abdominal   Peds  Hematology   Anesthesia Other Findings   Reproductive/Obstetrics                           Anesthesia Physical Anesthesia Plan  ASA: II  Anesthesia Plan: General   Post-op Pain Management:    Induction: Intravenous  Airway Management Planned: LMA  Additional Equipment:   Intra-op Plan:   Post-operative Plan: Extubation in OR  Informed Consent: I have reviewed the patients History and Physical, chart, labs and discussed the procedure including the risks, benefits and alternatives for the proposed anesthesia with the patient or authorized representative who has indicated his/her understanding and acceptance.     Plan Discussed with: CRNA and Surgeon  Anesthesia Plan Comments:         Anesthesia Quick Evaluation  

## 2013-07-16 NOTE — Anesthesia Procedure Notes (Signed)
Procedure Name: LMA Insertion Performed by: Joe Tanney, [place] Pre-anesthesia Checklist: Patient identified, Emergency Drugs available, Suction available and Patient being monitored Patient Re-evaluated:Patient Re-evaluated prior to inductionOxygen Delivery Method: Circle System Utilized Preoxygenation: Pre-oxygenation with 100% oxygen Intubation Type: IV induction Ventilation: Mask ventilation without difficulty LMA: LMA inserted LMA Size: 4.0 Number of attempts: 1 Airway Equipment and Method: bite block Placement Confirmation: positive ETCO2 Tube secured with: Tape Dental Injury: Teeth and Oropharynx as per pre-operative assessment      

## 2013-07-16 NOTE — Transfer of Care (Signed)
Immediate Anesthesia Transfer of Care Note  Patient: [name]  Procedure(s) Performed: Procedure(s): RIGHT KNEE ARTHROSCOPY WITH LATERAL MENISCECTOMY, and Chondroplasty (Right)  Patient Location: PACU  Anesthesia Type:General  Level of Consciousness: awake and patient cooperative  Airway & Oxygen Therapy: Patient Spontanous Breathing and Patient connected to face mask oxygen  Post-op Assessment: Report given to PACU RN and Post -op Vital signs reviewed and stable  Post vital signs: Reviewed and stable  Complications: No apparent anesthesia complications

## 2013-07-19 ENCOUNTER — Encounter (HOSPITAL_BASED_OUTPATIENT_CLINIC_OR_DEPARTMENT_OTHER): Payer: Self-pay | Admitting: Orthopedic Surgery

## 2013-07-19 NOTE — Op Note (Signed)
[name], [name]                ACCOUNT NO.:  [id number]  MEDICAL RECORD NO.:  [id number]  LOCATION:                               FACILITY:  [facility]  PHYSICIAN:  Loreta Ave, M.D. DATE OF BIRTH:  1940-11-03  DATE OF PROCEDURE:  07/16/2013 DATE OF DISCHARGE:  07/16/2013                              OPERATIVE REPORT   PREOPERATIVE DIAGNOSES:  Right knee chondromalacia patella. Degenerative arthritis.  Marked tearing lateral meniscus.  POSTOPERATIVE DIAGNOSES:  Right knee chondromalacia patella. Degenerative arthritis.  Marked tearing lateral meniscus with grade 2 and 3 changes patella, grade 3 and 4 changes laterally.  PROCEDURE:  Right knee exam under anesthesia and arthroscopy.  Extensive partial lateral meniscectomy. Chondroplasty patella lateral compartment.  SURGEON:  Loreta Ave, MD  ANESTHESIA:  General.  BLOOD LOSS:  Minimal.  SPECIMENS:  None.  CULTURES:  None.  COMPLICATIONS:  None.  DRESSINGS:  Soft compressive.  TOURNIQUET:  Not employed.  DESCRIPTION OF PROCEDURE:  The patient was brought to the operating room, placed on the operating room table in supine position.  After adequate anesthesia had been obtained, leg holder applied.  Leg prepped and draped in usual sterile fashion.  Two portals, one each medial and lateral parapatellar.  Arthroscope introduced, knee distended and inspected.  Reasonable tracking.  Grade 2 and 3 changes mostly lateral patella debrided.  Not tethered.  Cruciate ligaments intact.  Medial meniscus, medial compartment did not look bad and only mild changes. Laterally grade 3 and 4 changes with extensive complex tearing entire lateral meniscus worsening in the anterior half.  The anterior half removed, tapered into remaining meniscus.  At completion, the entire knee was examined to be sure all loose fragments were removed.  Instruments were fully removed.  Portals were closed with nylon.  Knee injected with Depo-Medrol  and Marcaine. Sterile compressive dressing applied.  Anesthesia reversed.  Brought to recovery room.  Tolerated the surgery well.  No complications.     Loreta Ave, M.D.     DFM/MEDQ  D:  07/19/2013  T:  07/19/2013  Job:  [id number]

## 2013-08-04 ENCOUNTER — Ambulatory Visit (INDEPENDENT_AMBULATORY_CARE_PROVIDER_SITE_OTHER): Payer: Medicare Other | Admitting: Internal Medicine

## 2013-08-04 ENCOUNTER — Encounter: Payer: Self-pay | Admitting: Internal Medicine

## 2013-08-04 VITALS — BP 140/84 | HR 80 | Temp 98.5°F | Resp 16 | Wt 178.0 lb

## 2013-08-04 DIAGNOSIS — M545 Low back pain, unspecified: Secondary | ICD-10-CM

## 2013-08-04 DIAGNOSIS — I10 Essential (primary) hypertension: Secondary | ICD-10-CM

## 2013-08-04 DIAGNOSIS — Z23 Encounter for immunization: Secondary | ICD-10-CM

## 2013-08-04 DIAGNOSIS — R609 Edema, unspecified: Secondary | ICD-10-CM

## 2013-08-04 DIAGNOSIS — M1711 Unilateral primary osteoarthritis, right knee: Secondary | ICD-10-CM

## 2013-08-04 DIAGNOSIS — F329 Major depressive disorder, single episode, unspecified: Secondary | ICD-10-CM

## 2013-08-04 DIAGNOSIS — E669 Obesity, unspecified: Secondary | ICD-10-CM

## 2013-08-04 DIAGNOSIS — F3289 Other specified depressive episodes: Secondary | ICD-10-CM

## 2013-08-04 DIAGNOSIS — E119 Type 2 diabetes mellitus without complications: Secondary | ICD-10-CM

## 2013-08-04 DIAGNOSIS — M1710 Unilateral primary osteoarthritis, unspecified knee: Secondary | ICD-10-CM

## 2013-08-04 DIAGNOSIS — IMO0002 Reserved for concepts with insufficient information to code with codable children: Secondary | ICD-10-CM

## 2013-08-04 DIAGNOSIS — F411 Generalized anxiety disorder: Secondary | ICD-10-CM

## 2013-08-04 MED ORDER — DESVENLAFAXINE SUCCINATE ER 50 MG PO TB24: 50.0000 mg | ORAL_TABLET | Freq: Every day | ORAL | Status: DC

## 2013-08-04 MED ORDER — LORCASERIN HCL 10 MG PO TABS: 1.0000 | ORAL_TABLET | Freq: Two times a day (BID) | ORAL | Status: DC

## 2013-08-04 NOTE — Assessment & Plan Note (Signed)
On Belviq  Wt Readings from Last 3 Encounters:  08/04/13 178 lb (80.74 kg)  07/16/13 175 lb (79.379 kg)  07/16/13 175 lb (79.379 kg)

## 2013-08-04 NOTE — Assessment & Plan Note (Signed)
Continue with current prescription therapy as reflected on the Med list.  

## 2013-08-04 NOTE — Progress Notes (Signed)
   Subjective:    HPI  F/u leg swelling x weeks - better  F/u LBP, neck pain on L - better. The patient presents for a follow-up of  chronic hypertension, chronic dyslipidemia, type 2 diabetes controlled with medicines  Depression is better on Rx  She had a L wrist/hand surgery and a R knee arthroscopy   Review of Systems  Constitutional: Negative for chills, activity change, appetite change, fatigue and unexpected weight change.  HENT: Negative for congestion, mouth sores and sinus pressure.   Eyes: Negative for visual disturbance.  Respiratory: Negative for cough and chest tightness.   Cardiovascular: Positive for leg swelling.  Gastrointestinal: Positive for constipation. Negative for nausea and abdominal pain.  Genitourinary: Negative for frequency, difficulty urinating and vaginal pain.  Musculoskeletal: Negative for back pain and gait problem.  Skin: Negative for pallor and rash.  Neurological: Negative for dizziness, tremors, weakness, numbness and headaches.  Psychiatric/Behavioral: Negative for suicidal ideas, hallucinations, confusion and sleep disturbance. The patient is nervous/anxious (better, less depressed).      Wt Readings from Last 3 Encounters:  08/04/13 178 lb (80.74 kg)  07/16/13 175 lb (79.379 kg)  07/16/13 175 lb (79.379 kg)   BP Readings from Last 3 Encounters:  08/04/13 140/84  07/16/13 150/68  07/16/13 150/68        Objective:   Physical Exam  Constitutional: She appears well-developed and well-nourished. No distress.  HENT:  Head: Normocephalic.  Right Ear: External ear normal.  Left Ear: External ear normal.  Nose: Nose normal.  Mouth/Throat: Oropharynx is clear and moist.  Eyes: Conjunctivae are normal. Pupils are equal, round, and reactive to light. Right eye exhibits no discharge. Left eye exhibits no discharge.  Neck: Normal range of motion. Neck supple. No JVD present. No tracheal deviation present. No thyromegaly present.   Cardiovascular: Normal rate, regular rhythm and normal heart sounds.   Pulmonary/Chest: No stridor. No respiratory distress. She has no wheezes.  Abdominal: Soft. Bowel sounds are normal. She exhibits no distension and no mass. There is no tenderness. There is no rebound and no guarding.  Musculoskeletal: She exhibits edema (trace). She exhibits no tenderness.  Lymphadenopathy:    She has no cervical adenopathy.  Neurological: She displays normal reflexes. No cranial nerve deficit. She exhibits normal muscle tone. Coordination normal.  Mild ataxia Non-focal exam  Skin: No rash noted. No erythema.  Psychiatric: She has a normal mood and affect. Her behavior is normal. Judgment and thought content normal.  Better!   Lab Results  Component Value Date   WBC 11.3* 08/07/2010   HGB 12.6 07/16/2013   HCT 37.0 07/16/2013   PLT 479.0* 08/07/2010   GLUCOSE 124* 07/16/2013   CHOL 116 08/07/2010   TRIG 86.0 08/07/2010   HDL 50.40 08/07/2010   LDLCALC 48 08/07/2010   ALT 29 08/07/2010   AST 33 08/07/2010   NA 136 07/16/2013   K 3.9 07/16/2013   CL 101 07/16/2013   CREATININE 0.80 07/16/2013   BUN 16 07/16/2013   CO2 29 08/07/2010   TSH 1.26 08/07/2010   HGBA1C 7.3* 08/07/2010   MICROALBUR 0.2 11/07/2006          Assessment & Plan:

## 2013-08-04 NOTE — Progress Notes (Signed)
Pre visit review using our clinic review tool, if applicable. No additional management support is needed unless otherwise documented below in the visit note. 

## 2013-08-04 NOTE — Assessment & Plan Note (Signed)
resolved 

## 2013-08-31 ENCOUNTER — Other Ambulatory Visit: Payer: Self-pay | Admitting: Internal Medicine

## 2013-08-31 ENCOUNTER — Ambulatory Visit (HOSPITAL_COMMUNITY): Payer: Medicare HMO | Attending: Orthopedic Surgery

## 2013-08-31 DIAGNOSIS — M7989 Other specified soft tissue disorders: Secondary | ICD-10-CM | POA: Insufficient documentation

## 2013-08-31 DIAGNOSIS — E119 Type 2 diabetes mellitus without complications: Secondary | ICD-10-CM | POA: Insufficient documentation

## 2013-08-31 DIAGNOSIS — E785 Hyperlipidemia, unspecified: Secondary | ICD-10-CM | POA: Insufficient documentation

## 2013-08-31 DIAGNOSIS — Z9889 Other specified postprocedural states: Secondary | ICD-10-CM | POA: Insufficient documentation

## 2013-08-31 DIAGNOSIS — M79609 Pain in unspecified limb: Secondary | ICD-10-CM

## 2013-08-31 DIAGNOSIS — R609 Edema, unspecified: Secondary | ICD-10-CM

## 2013-08-31 DIAGNOSIS — I10 Essential (primary) hypertension: Secondary | ICD-10-CM | POA: Insufficient documentation

## 2013-08-31 NOTE — Telephone Encounter (Signed)
Refill done.  

## 2013-09-01 ENCOUNTER — Telehealth: Payer: Self-pay | Admitting: Internal Medicine

## 2013-09-01 MED ORDER — LORCASERIN HCL 10 MG PO TABS: 1.0000 | ORAL_TABLET | Freq: Two times a day (BID) | ORAL | Status: DC

## 2013-09-01 NOTE — Telephone Encounter (Signed)
Patient called stating [facility] on [address] sent letter requesting the doctor to circle 60 pills for 30 days Stated the wrong thing was circled and it needs to be correct  Please advise

## 2013-09-01 NOTE — Telephone Encounter (Signed)
Ok per Md- updated Rx faxed to [facility].

## 2013-09-07 DIAGNOSIS — E1149 Type 2 diabetes mellitus with other diabetic neurological complication: Secondary | ICD-10-CM

## 2013-10-07 ENCOUNTER — Ambulatory Visit (INDEPENDENT_AMBULATORY_CARE_PROVIDER_SITE_OTHER): Payer: Medicare HMO | Admitting: Podiatry

## 2013-10-07 ENCOUNTER — Encounter: Payer: Self-pay | Admitting: Podiatry

## 2013-10-07 VITALS — BP 148/78 | HR 94 | Resp 16

## 2013-10-07 DIAGNOSIS — M7750 Other enthesopathy of unspecified foot: Secondary | ICD-10-CM

## 2013-10-07 DIAGNOSIS — G589 Mononeuropathy, unspecified: Secondary | ICD-10-CM

## 2013-10-08 NOTE — Progress Notes (Signed)
Subjective:     Patient ID: [name], female   DOB: 11-Apr-1941, 73 y.o.   MRN: [id number]  HPI patient states she continues to experience numbness in both her feet and into her ankles and is due to have knee replacement surgery done in the next several weeks right knee states he does not seem to be getting worse but not better either   Review of Systems     Objective:   Physical Exam Neurological sensation is diminished but unchanged from previous visit with vascular circulation been intact. 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 We educated her on neuropathy we discussed other treatment options available and the future including neuro gen X. but we will continue with this treatment at this time

## 2013-10-13 ENCOUNTER — Telehealth: Payer: Self-pay | Admitting: Gastroenterology

## 2013-10-13 NOTE — Telephone Encounter (Signed)
Left message for patient to call back  

## 2013-10-14 NOTE — Telephone Encounter (Signed)
Patient scheduled to see Dr. Fuller Plan on 11/08/13.  She is added to the wait list

## 2013-10-14 NOTE — Telephone Encounter (Signed)
Left message for patient to call back  

## 2013-10-26 ENCOUNTER — Other Ambulatory Visit: Payer: Self-pay | Admitting: Physician Assistant
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
## 2013-10-29 ENCOUNTER — Encounter (HOSPITAL_COMMUNITY): Payer: Self-pay | Admitting: Pharmacy Technician

## 2013-10-29 ENCOUNTER — Ambulatory Visit (INDEPENDENT_AMBULATORY_CARE_PROVIDER_SITE_OTHER): Payer: Medicare HMO | Admitting: Internal Medicine

## 2013-10-29 ENCOUNTER — Encounter: Payer: Self-pay | Admitting: Internal Medicine

## 2013-10-29 VITALS — BP 150/90 | HR 76 | Temp 98.6°F | Resp 16 | Ht 62.5 in | Wt 181.0 lb

## 2013-10-29 DIAGNOSIS — Z0000 Encounter for general adult medical examination without abnormal findings: Secondary | ICD-10-CM

## 2013-10-29 DIAGNOSIS — K5900 Constipation, unspecified: Secondary | ICD-10-CM | POA: Insufficient documentation

## 2013-10-29 DIAGNOSIS — M1711 Unilateral primary osteoarthritis, right knee: Secondary | ICD-10-CM

## 2013-10-29 DIAGNOSIS — M545 Low back pain, unspecified: Secondary | ICD-10-CM

## 2013-10-29 DIAGNOSIS — I10 Essential (primary) hypertension: Secondary | ICD-10-CM

## 2013-10-29 DIAGNOSIS — IMO0002 Reserved for concepts with insufficient information to code with codable children: Secondary | ICD-10-CM

## 2013-10-29 DIAGNOSIS — E119 Type 2 diabetes mellitus without complications: Secondary | ICD-10-CM

## 2013-10-29 DIAGNOSIS — M1710 Unilateral primary osteoarthritis, unspecified knee: Secondary | ICD-10-CM

## 2013-10-29 MED ORDER — LINACLOTIDE 290 MCG PO CAPS: 290.0000 ug | ORAL_CAPSULE | Freq: Every day | ORAL | Status: DC

## 2013-10-29 NOTE — Assessment & Plan Note (Signed)
Continue with current prescription therapy as reflected on the Med list.  

## 2013-10-29 NOTE — Assessment & Plan Note (Signed)
[place] 

## 2013-10-29 NOTE — Progress Notes (Signed)
Pre visit review using our clinic review tool, if applicable. No additional management support is needed unless otherwise documented below in the visit note. 

## 2013-10-29 NOTE — Progress Notes (Signed)
   Subjective:    HPI  The patient is here for a wellness exam. F/u leg swelling x weeks - better  F/u LBP, neck pain on L - better. The patient presents for a follow-up of  chronic hypertension, chronic dyslipidemia, type 2 diabetes controlled with medicines  Depression is better on Rx   S/p arthroscopic surgery 2014 THR 11/10/13 Dr Percell Miller - she will be at [facility]  Constitutional: Negative for chills, activity change, appetite change, fatigue and unexpected weight change.  HENT: Negative for congestion, mouth sores and sinus pressure.   Eyes: Negative for visual disturbance.  Respiratory: Negative for cough and chest tightness.   Cardiovascular: Positive for leg swelling.  Gastrointestinal: Positive for constipation. Negative for nausea and abdominal pain.  Genitourinary: Negative for frequency, difficulty urinating and vaginal pain.  Musculoskeletal: Negative for back pain and gait problem.  Skin: Negative for pallor and rash.  Neurological: Negative for dizziness, tremors, weakness, numbness and headaches.  Psychiatric/Behavioral: Negative for suicidal ideas, hallucinations, confusion and sleep disturbance. The patient is nervous/anxious (better, less depressed).      Wt Readings from Last 3 Encounters:  10/29/13 181 lb (82.101 kg)  08/04/13 178 lb (80.74 kg)  07/16/13 175 lb (79.379 kg)   BP Readings from Last 3 Encounters:  10/29/13 150/90  10/07/13 148/78  08/04/13 140/84        Objective:   Physical Exam  Constitutional: She appears well-developed and well-nourished. No distress.  Obese  HENT:  Head: Normocephalic.  Right Ear: External ear normal.  Left Ear: External ear normal.  Nose: Nose normal.  Mouth/Throat: Oropharynx is clear and moist.  Eyes: Conjunctivae are normal. Pupils are equal, round, and reactive to light. Right eye exhibits no discharge. Left eye exhibits no discharge.  Neck: Normal range of motion. Neck supple.  No JVD present. No tracheal deviation present. No thyromegaly present.  Cardiovascular: Normal rate, regular rhythm and normal heart sounds.   Pulmonary/Chest: No stridor. No respiratory distress. She has no wheezes.  Abdominal: Soft. Bowel sounds are normal. She exhibits no distension and no mass. There is no tenderness. There is no rebound and no guarding.  Musculoskeletal: She exhibits no edema and no tenderness.  Lymphadenopathy:    She has no cervical adenopathy.  Neurological: She displays normal reflexes. No cranial nerve deficit. She exhibits normal muscle tone. Coordination normal.  Mild ataxia Non-focal exam  Skin: No rash noted. No erythema.  Psychiatric: She has a normal mood and affect. Her behavior is normal. Judgment and thought content normal.  Better!   Lab Results  Component Value Date   WBC 11.3* 08/07/2010   HGB 12.6 07/16/2013   HCT 37.0 07/16/2013   PLT 479.0* 08/07/2010   GLUCOSE 124* 07/16/2013   CHOL 116 08/07/2010   TRIG 86.0 08/07/2010   HDL 50.40 08/07/2010   LDLCALC 48 08/07/2010   ALT 29 08/07/2010   AST 33 08/07/2010   NA 136 07/16/2013   K 3.9 07/16/2013   CL 101 07/16/2013   CREATININE 0.80 07/16/2013   BUN 16 07/16/2013   CO2 29 08/07/2010   TSH 1.26 08/07/2010   HGBA1C 7.3* 08/07/2010   MICROALBUR 0.2 11/07/2006          Assessment & Plan:

## 2013-10-29 NOTE — Assessment & Plan Note (Signed)
S/p arthroscopic surgery 2014 THR 11/10/13 Dr Percell Miller

## 2013-10-29 NOTE — Assessment & Plan Note (Signed)
The patient is here for annual Medicare wellness examination and management of other chronic and acute problems.   The risk factors are reflected in the social history.  The roster of all physicians providing medical care to patient - is listed in the Snapshot section of the chart.  Activities of daily living:  The patient is 100% inedpendent in all ADLs: dressing, toileting, feeding as well as independent mobility  Home safety : good. Using seatbelts. There is no violence in the home.   There is no risks for hepatitis, STDs or HIV. There is no history of blood transfusion. They have no travel history to infectious disease endemic areas of the world.  The patient has  seen their dentist in the last 12 month. They have  seen their eye doctor in the last year. They deny  Any major hearing difficulty and have not had audiologic testing in the last year.  They do not  have excessive sun exposure. Discussed the need for sun protection: hats, long sleeves and use of sunscreen if there is significant sun exposure.   Diet: the importance of a healthy diet is discussed. They do have a reasonably healthy  diet.  The patient has a fairly regular exercise program of a mixed nature: walking, yard work, etc.The benefits of regular aerobic exercise were discussed.  Depression screen: there are no signs or vegative symptoms of depression- irritability, change in appetite, anhedonia, sadness/tearfullness.  Cognitive assessment: the patient manages all their financial and personal affairs and is actively engaged. They could relate day,date,year and events; recalled 3/3 objects at 3 minutes  The following portions of the patient's history were reviewed and updated as appropriate: allergies, current medications, past family history, past medical history,  past surgical history, past social history  and problem list.  Vision, hearing, body mass index were assessed and reviewed.   During the course of the visit  the patient was educated and counseled about appropriate screening and preventive services including : fall prevention , diabetes screening, nutrition counseling, colorectal cancer screening, and recommended immunizations.  

## 2013-11-02 ENCOUNTER — Encounter (HOSPITAL_COMMUNITY): Payer: Self-pay

## 2013-11-02 ENCOUNTER — Encounter (HOSPITAL_COMMUNITY)
Admission: RE | Admit: 2013-11-02 | Discharge: 2013-11-02 | Disposition: A | Discharge status: Home / Self Care | Payer: Medicare HMO | Source: Ambulatory Visit | Attending: Physician Assistant | Admitting: Physician Assistant

## 2013-11-02 ENCOUNTER — Encounter (HOSPITAL_COMMUNITY)
Admission: RE | Admit: 2013-11-02 | Discharge: 2013-11-02 | Disposition: A | Discharge status: Home / Self Care | Payer: Medicare HMO | Source: Ambulatory Visit | Attending: Orthopedic Surgery | Admitting: Orthopedic Surgery

## 2013-11-02 ENCOUNTER — Telehealth: Payer: Self-pay | Admitting: Internal Medicine

## 2013-11-02 DIAGNOSIS — Z01812 Encounter for preprocedural laboratory examination: Secondary | ICD-10-CM | POA: Insufficient documentation

## 2013-11-02 DIAGNOSIS — Z01810 Encounter for preprocedural cardiovascular examination: Secondary | ICD-10-CM | POA: Insufficient documentation

## 2013-11-02 HISTORY — DX: Personal history of urinary (tract) infections: Z87.440

## 2013-11-02 LAB — COMPREHENSIVE METABOLIC PANEL
ALBUMIN: 3.9 g/dL (ref 3.5–5.2)
ALK PHOS: 71 U/L (ref 39–117)
ALT: 19 U/L (ref 0–35)
AST: 27 U/L (ref 0–37)
BUN: 14 mg/dL (ref 6–23)
CHLORIDE: 97 meq/L (ref 96–112)
CO2: 26 mEq/L (ref 19–32)
Calcium: 9.9 mg/dL (ref 8.4–10.5)
Creatinine, Ser: 0.72 mg/dL (ref 0.50–1.10)
GFR calc Af Amer: >90 mL/min (ref >90)
GFR calc non Af Amer: 84 mL/min — ABNORMAL LOW (ref >90)
Glucose, Bld: 116 mg/dL — ABNORMAL HIGH (ref 70–99)
Potassium: 4.3 mEq/L (ref 3.7–5.3)
SODIUM: 138 meq/L (ref 137–147)
TOTAL PROTEIN: 7.2 g/dL (ref 6.0–8.3)

## 2013-11-02 LAB — CBC WITH DIFFERENTIAL/PLATELET
BASOS PCT: 2 % — AB (ref 0–1)
Basophils Absolute: 0.2 10*3/uL — ABNORMAL HIGH (ref 0.0–0.1)
EOS ABS: 0.5 10*3/uL (ref 0.0–0.7)
Eosinophils Relative: 6 % — ABNORMAL HIGH (ref 0–5)
HCT: 32.3 % — ABNORMAL LOW (ref 36.0–46.0)
HEMOGLOBIN: 10.8 g/dL — AB (ref 12.0–15.0)
LYMPHS ABS: 2.7 10*3/uL (ref 0.7–4.0)
Lymphocytes Relative: 32 % (ref 12–46)
MCH: 29.3 pg (ref 26.0–34.0)
MCHC: 33.4 g/dL (ref 30.0–36.0)
MCV: 87.8 fL (ref 78.0–100.0)
MONOS PCT: 10 % (ref 3–12)
Monocytes Absolute: 0.8 10*3/uL (ref 0.1–1.0)
NEUTROS ABS: 4.2 10*3/uL (ref 1.7–7.7)
NEUTROS PCT: 51 % (ref 43–77)
Platelets: 568 10*3/uL — ABNORMAL HIGH (ref 150–400)
RBC: 3.68 MIL/uL — AB (ref 3.87–5.11)
RDW: 15 % (ref 11.5–15.5)
WBC: 8.3 10*3/uL (ref 4.0–10.5)

## 2013-11-02 LAB — URINALYSIS, ROUTINE W REFLEX MICROSCOPIC
Bilirubin Urine: NEGATIVE
Glucose, UA: NEGATIVE mg/dL
Hgb urine dipstick: NEGATIVE
Ketones, ur: NEGATIVE mg/dL
LEUKOCYTES UA: NEGATIVE
NITRITE: NEGATIVE
PH: 5.5 (ref 5.0–8.0)
Protein, ur: NEGATIVE mg/dL
SPECIFIC GRAVITY, URINE: 1.018 (ref 1.005–1.030)
UROBILINOGEN UA: 0.2 mg/dL (ref 0.0–1.0)

## 2013-11-02 LAB — APTT: APTT: 28 s (ref 24–37)

## 2013-11-02 LAB — TYPE AND SCREEN
ABO/RH(D): A NEG
ANTIBODY SCREEN: NEGATIVE

## 2013-11-02 LAB — SURGICAL PCR SCREEN
MRSA, PCR: NEGATIVE
Staphylococcus aureus: NEGATIVE

## 2013-11-02 LAB — ABO/RH: ABO/RH(D): A NEG

## 2013-11-02 LAB — PROTIME-INR
INR: 0.93 (ref 0.00–1.49)
Prothrombin Time: 12.3 seconds (ref 11.6–15.2)

## 2013-11-02 NOTE — Telephone Encounter (Signed)
[name] with Dr. Debroah Loop office is calling to follow-up on the surgery clearance form she faxed to our office last week. Patient is having a total knee on 11/10/13 and they need the form back or something faxed stating that she is cleared. Fax# [phone]. Please follow up.

## 2013-11-02 NOTE — Pre-Procedure Instructions (Addendum)
[name]  11/02/2013   Your procedure is scheduled on:  11/10/13  Report to [facility] short stay admitting at call [phone] AM.of surgery between 69 -36 for time to arrive  Call this number if you have problems the morning of surgery: [phone]   Remember:   Do not eat food or drink liquids after midnight.   Take these medicines the morning of surgery with A SIP OF WATER: pristiq,           STOP all herbel meds, nsaids (aleve,naproxen,advil,ibuprofen) 5 days prior to surgery including vitamins, aspirin,all over counter meds, NO DIABETIC MED AM OF SURGERY   Do not wear jewelry, make-up or nail polish.  Do not wear lotions, powders, or perfumes. You may wear deodorant.  Do not shave 48 hours prior to surgery. Men may shave face and neck.  Do not bring valuables to the hospital.  [facility] is not responsible                  for any belongings or valuables.               Contacts, dentures or bridgework may not be worn into surgery.  Leave suitcase in the car. After surgery it may be brought to your room.  For patients admitted to the hospital, discharge time is determined by your                treatment team.               Patients discharged the day of surgery will not be allowed to drive  home.  Name and phone number of your driver:   Special Instructions:  Special Instructions: [facility] - Preparing for Surgery  Before surgery, you can play an important role.  Because skin is not sterile, your skin needs to be as free of germs as possible.  You can reduce the number of germs on you skin by washing with CHG (chlorahexidine gluconate) soap before surgery.  CHG is an antiseptic cleaner which kills germs and bonds with the skin to continue killing germs even after washing.  Please DO NOT use if you have an allergy to CHG or antibacterial soaps.  If your skin becomes reddened/irritated stop using the CHG and inform your nurse when you arrive at Short Stay.  Do not shave  (including legs and underarms) for at least 48 hours prior to the first CHG shower.  You may shave your face.  Please follow these instructions carefully:   1.  Shower with CHG Soap the night before surgery and the morning of Surgery.  2.  If you choose to wash your hair, wash your hair first as usual with your normal shampoo.  3.  After you shampoo, rinse your hair and body thoroughly to remove the Shampoo.  4.  Use CHG as you would any other liquid soap.  You can apply chg directly  to the skin and wash gently with scrungie or a clean washcloth.  5.  Apply the CHG Soap to your body ONLY FROM THE NECK DOWN.  Do not use on open wounds or open sores.  Avoid contact with your eyes ears, mouth and genitals (private parts).  Wash genitals (private parts)       with your normal soap.  6.  Wash thoroughly, paying special attention to the area where your surgery will be performed.  7.  Thoroughly rinse your body with warm water from the neck  down.  8.  DO NOT shower/wash with your normal soap after using and rinsing off the CHG Soap.  9.  Pat yourself dry with a clean towel.            10.  Wear clean pajamas.            11.  Place clean sheets on your bed the night of your first shower and do not sleep with pets.  Day of Surgery  Do not apply any lotions/deodorants the morning of surgery.  Please wear clean clothes to the hospital/surgery center.   Please read over the following fact sheets that you were given: Pain Booklet, Coughing and Deep Breathing, Blood Transfusion Information, Total Joint Packet, MRSA Information and Surgical Site Infection Prevention

## 2013-11-03 ENCOUNTER — Telehealth: Payer: Self-pay

## 2013-11-03 ENCOUNTER — Encounter: Payer: Self-pay | Admitting: Internal Medicine

## 2013-11-03 LAB — URINE CULTURE
COLONY COUNT: NO GROWTH
CULTURE: NO GROWTH

## 2013-11-03 NOTE — Telephone Encounter (Signed)
Updated form faxed back to surgeon.

## 2013-11-03 NOTE — Telephone Encounter (Signed)
Relevant patient education assigned to patient using Emmi. ° °

## 2013-11-03 NOTE — Telephone Encounter (Signed)
Have you seen this clearance form?

## 2013-11-03 NOTE — Telephone Encounter (Signed)
I think I signed it a while ago. She is clear for surgery. Thx

## 2013-11-04 ENCOUNTER — Telehealth: Payer: Self-pay | Admitting: *Deleted

## 2013-11-04 NOTE — Telephone Encounter (Signed)
Estradiol patch approved per [employer] 08/26/13-08/25/14.

## 2013-11-04 NOTE — Telephone Encounter (Signed)
Prior authorization filled out and faxed to insurance company for generic patch 0.0375 mg , will wait for response.

## 2013-11-08 ENCOUNTER — Ambulatory Visit (INDEPENDENT_AMBULATORY_CARE_PROVIDER_SITE_OTHER): Payer: Medicare HMO | Admitting: Gastroenterology

## 2013-11-08 ENCOUNTER — Encounter: Payer: Self-pay | Admitting: Gastroenterology

## 2013-11-08 ENCOUNTER — Encounter: Payer: Self-pay | Admitting: Internal Medicine

## 2013-11-08 VITALS — BP 140/90 | HR 80 | Ht 62.5 in | Wt 180.0 lb

## 2013-11-08 DIAGNOSIS — R109 Unspecified abdominal pain: Secondary | ICD-10-CM

## 2013-11-08 DIAGNOSIS — K219 Gastro-esophageal reflux disease without esophagitis: Secondary | ICD-10-CM

## 2013-11-08 DIAGNOSIS — K5900 Constipation, unspecified: Secondary | ICD-10-CM

## 2013-11-08 DIAGNOSIS — K589 Irritable bowel syndrome without diarrhea: Secondary | ICD-10-CM

## 2013-11-08 MED ORDER — LINACLOTIDE 145 MCG PO CAPS: 145.0000 ug | ORAL_CAPSULE | Freq: Every day | ORAL | Status: DC

## 2013-11-08 MED ORDER — DICYCLOMINE HCL 10 MG PO CAPS: 10.0000 mg | ORAL_CAPSULE | Freq: Four times a day (QID) | ORAL | Status: DC | PRN

## 2013-11-08 NOTE — Patient Instructions (Signed)
We have sent the following medications to your pharmacy for you to pick up at your convenience: [facility].   Thank you for choosing me and [facility].  Pricilla Riffle. Dagoberto Ligas., MD., Marval Regal

## 2013-11-08 NOTE — Progress Notes (Signed)
    History of Present Illness: This is a 73 year old female GERD and IBS-C. She underwent an upper endoscopy in October 2014 for GERD and dysphagia. Only a small HH was noted. There was no stricture noted and she underwent an empiric dilation. Her PPI therapy and antireflux measures were intensified. She has a history of constipation and IBS. She underwent colonoscopy in February 2013 with a 10 mm serrated adenoma removed from the cecum. She relates problems with constipation for several years associated with crampy lower abdominal pain before bowel movements. Occasionally she will have urgent bowel movements. She was recommended to remain on regularly scheduled MiraLax but she said it was not very effective so she discontinued it. She is currently taking a probiotic and Perdiem without adequate results. She has total right knee replacement scheduled later this week. Denies weight loss, diarrhea, change in stool caliber, melena, hematochezia, nausea, vomiting, dysphagia, reflux symptoms, chest pain.  Current Medications, Allergies, Past Medical History, Past Surgical History, Family History and Social History were reviewed in [employer] record.  Physical Exam: General: Well developed , well nourished, no acute distress Head: Normocephalic and atraumatic Eyes:  sclerae anicteric, EOMI Ears: Normal auditory acuity Mouth: No deformity or lesions Lungs: Clear throughout to auscultation Heart: Regular rate and rhythm; no murmurs, rubs or bruits Abdomen: Soft, mild lower abdominal tenderness to deep palpation without rebound or guarding and non distended. No masses, hepatosplenomegaly or hernias noted. Normal Bowel sounds Musculoskeletal: Symmetrical with no gross deformities  Pulses:  Normal pulses noted Extremities: No clubbing, cyanosis, edema or deformities noted Neurological: Alert oriented x 4, grossly nonfocal Psychological:  Alert and cooperative. Normal mood and  affect  Assessment and Recommendations:  1. IBS-C. Begin Linzess 145 mcg daily. If this is not effective increase to 290 mcg daily. Continue Perdiem and daily probiotic. Bentyl 10 mg 3 times a day when necessary.  2. GERD, well controlled. Continue dexlansoprazole and antireflux measures  3. Personal history of adenomatous colon polyps. Surveillance colonoscopy recommended February 2016.

## 2013-11-09 MED ORDER — CHLORHEXIDINE GLUCONATE 4 % EX LIQD
60.0000 mL | Freq: Once | CUTANEOUS | Status: DC
  Filled 2013-11-09: qty 60

## 2013-11-09 MED ORDER — CEFAZOLIN SODIUM-DEXTROSE 2-3 GM-% IV SOLR
2.0000 g | INTRAVENOUS | Status: AC
  Administered 2013-11-10: 2 g via INTRAVENOUS

## 2013-11-09 NOTE — Progress Notes (Signed)
Spoke with husband and notified of arrival time of 1100 also left message on pts voicemail cell

## 2013-11-10 ENCOUNTER — Inpatient Hospital Stay (HOSPITAL_COMMUNITY)
Admission: RE | Admit: 2013-11-10 | Discharge: 2013-11-13 | DRG: 470 | Disposition: A | Discharge status: Skilled Nursing Facility | Payer: Medicare HMO | Source: Ambulatory Visit | Attending: Orthopedic Surgery | Admitting: Orthopedic Surgery

## 2013-11-10 ENCOUNTER — Encounter (HOSPITAL_COMMUNITY): Payer: Self-pay | Admitting: *Deleted

## 2013-11-10 ENCOUNTER — Inpatient Hospital Stay (HOSPITAL_COMMUNITY): Payer: Medicare HMO

## 2013-11-10 ENCOUNTER — Encounter (HOSPITAL_COMMUNITY): Payer: Medicare HMO | Admitting: Vascular Surgery

## 2013-11-10 ENCOUNTER — Inpatient Hospital Stay (HOSPITAL_COMMUNITY): Payer: Medicare HMO | Admitting: Anesthesiology

## 2013-11-10 ENCOUNTER — Encounter (HOSPITAL_COMMUNITY)
Admission: RE | Disposition: A | Discharge status: Skilled Nursing Facility | Payer: Self-pay | Source: Ambulatory Visit | Attending: Orthopedic Surgery

## 2013-11-10 DIAGNOSIS — Z833 Family history of diabetes mellitus: Secondary | ICD-10-CM

## 2013-11-10 DIAGNOSIS — Z8371 Family history of colonic polyps: Secondary | ICD-10-CM

## 2013-11-10 DIAGNOSIS — M545 Low back pain, unspecified: Secondary | ICD-10-CM | POA: Diagnosis present

## 2013-11-10 DIAGNOSIS — Z9849 Cataract extraction status, unspecified eye: Secondary | ICD-10-CM

## 2013-11-10 DIAGNOSIS — M1990 Unspecified osteoarthritis, unspecified site: Secondary | ICD-10-CM | POA: Diagnosis present

## 2013-11-10 DIAGNOSIS — F411 Generalized anxiety disorder: Secondary | ICD-10-CM | POA: Diagnosis present

## 2013-11-10 DIAGNOSIS — M899 Disorder of bone, unspecified: Secondary | ICD-10-CM | POA: Diagnosis present

## 2013-11-10 DIAGNOSIS — E785 Hyperlipidemia, unspecified: Secondary | ICD-10-CM | POA: Diagnosis present

## 2013-11-10 DIAGNOSIS — K449 Diaphragmatic hernia without obstruction or gangrene: Secondary | ICD-10-CM | POA: Diagnosis present

## 2013-11-10 DIAGNOSIS — F418 Other specified anxiety disorders: Secondary | ICD-10-CM | POA: Diagnosis present

## 2013-11-10 DIAGNOSIS — F329 Major depressive disorder, single episode, unspecified: Secondary | ICD-10-CM | POA: Diagnosis present

## 2013-11-10 DIAGNOSIS — M179 Osteoarthritis of knee, unspecified: Secondary | ICD-10-CM | POA: Diagnosis present

## 2013-11-10 DIAGNOSIS — K589 Irritable bowel syndrome without diarrhea: Secondary | ICD-10-CM | POA: Diagnosis present

## 2013-11-10 DIAGNOSIS — M949 Disorder of cartilage, unspecified: Secondary | ICD-10-CM

## 2013-11-10 DIAGNOSIS — F32A Depression, unspecified: Secondary | ICD-10-CM | POA: Diagnosis present

## 2013-11-10 DIAGNOSIS — R945 Abnormal results of liver function studies: Secondary | ICD-10-CM

## 2013-11-10 DIAGNOSIS — E119 Type 2 diabetes mellitus without complications: Secondary | ICD-10-CM | POA: Diagnosis present

## 2013-11-10 DIAGNOSIS — I119 Hypertensive heart disease without heart failure: Secondary | ICD-10-CM | POA: Diagnosis present

## 2013-11-10 DIAGNOSIS — Z803 Family history of malignant neoplasm of breast: Secondary | ICD-10-CM

## 2013-11-10 DIAGNOSIS — Z860100 Personal history of colon polyps, unspecified: Secondary | ICD-10-CM

## 2013-11-10 DIAGNOSIS — Z9089 Acquired absence of other organs: Secondary | ICD-10-CM

## 2013-11-10 DIAGNOSIS — M1710 Unilateral primary osteoarthritis, unspecified knee: Principal | ICD-10-CM | POA: Diagnosis present

## 2013-11-10 DIAGNOSIS — K219 Gastro-esophageal reflux disease without esophagitis: Secondary | ICD-10-CM | POA: Diagnosis present

## 2013-11-10 DIAGNOSIS — K5730 Diverticulosis of large intestine without perforation or abscess without bleeding: Secondary | ICD-10-CM | POA: Diagnosis present

## 2013-11-10 DIAGNOSIS — Z800 Family history of malignant neoplasm of digestive organs: Secondary | ICD-10-CM

## 2013-11-10 DIAGNOSIS — E669 Obesity, unspecified: Secondary | ICD-10-CM | POA: Diagnosis present

## 2013-11-10 DIAGNOSIS — R7989 Other specified abnormal findings of blood chemistry: Secondary | ICD-10-CM | POA: Diagnosis present

## 2013-11-10 DIAGNOSIS — E118 Type 2 diabetes mellitus with unspecified complications: Secondary | ICD-10-CM | POA: Diagnosis present

## 2013-11-10 DIAGNOSIS — Z83719 Family history of colon polyps, unspecified: Secondary | ICD-10-CM

## 2013-11-10 HISTORY — PX: TOTAL KNEE ARTHROPLASTY: SHX125

## 2013-11-10 HISTORY — DX: Migraine, unspecified, not intractable, without status migrainosus: G43.909

## 2013-11-10 HISTORY — DX: Iron deficiency anemia, unspecified: D50.9

## 2013-11-10 HISTORY — DX: Personal history of other medical treatment: Z92.89

## 2013-11-10 HISTORY — DX: Type 2 diabetes mellitus without complications: E11.9

## 2013-11-10 LAB — GLUCOSE, CAPILLARY
GLUCOSE-CAPILLARY: 178 mg/dL — AB (ref 70–99)
Glucose-Capillary: 140 mg/dL — ABNORMAL HIGH (ref 70–99)
Glucose-Capillary: 141 mg/dL — ABNORMAL HIGH (ref 70–99)
Glucose-Capillary: 150 mg/dL — ABNORMAL HIGH (ref 70–99)

## 2013-11-10 SURGERY — ARTHROPLASTY, KNEE, TOTAL
Anesthesia: General | Site: Knee | Laterality: Right

## 2013-11-10 MED ORDER — ESTRADIOL 0.0375 MG/24HR TD PTWK: 0.0375 mg | MEDICATED_PATCH | TRANSDERMAL | Status: DC

## 2013-11-10 MED ORDER — ONDANSETRON HCL 4 MG PO TABS: 4.0000 mg | ORAL_TABLET | Freq: Three times a day (TID) | ORAL | Status: DC | PRN

## 2013-11-10 MED ORDER — METHOCARBAMOL 500 MG PO TABS: 500.0000 mg | ORAL_TABLET | Freq: Four times a day (QID) | ORAL | Status: DC

## 2013-11-10 MED ORDER — HYDROMORPHONE HCL PF 1 MG/ML IJ SOLN
INTRAMUSCULAR | Status: AC
  Filled 2013-11-10: qty 1

## 2013-11-10 MED ORDER — DOCUSATE SODIUM 100 MG PO CAPS
100.0000 mg | ORAL_CAPSULE | Freq: Two times a day (BID) | ORAL | Status: DC
  Administered 2013-11-10 – 2013-11-12 (×5): 100 mg via ORAL
  Filled 2013-11-10 (×7): qty 1

## 2013-11-10 MED ORDER — ONDANSETRON HCL 4 MG PO TABS
4.0000 mg | ORAL_TABLET | Freq: Four times a day (QID) | ORAL | Status: DC | PRN
  Administered 2013-11-13: 4 mg via ORAL
  Filled 2013-11-10: qty 1

## 2013-11-10 MED ORDER — DIPHENHYDRAMINE HCL 12.5 MG/5ML PO ELIX: 12.5000 mg | ORAL_SOLUTION | ORAL | Status: DC | PRN

## 2013-11-10 MED ORDER — LORAZEPAM 0.5 MG PO TABS
0.5000 mg | ORAL_TABLET | Freq: Four times a day (QID) | ORAL | Status: DC | PRN
  Administered 2013-11-10: 1 mg via ORAL
  Administered 2013-11-11: 0.5 mg via ORAL
  Filled 2013-11-10: qty 2
  Filled 2013-11-10: qty 1

## 2013-11-10 MED ORDER — SODIUM CHLORIDE 0.9 % IJ SOLN
INTRAMUSCULAR | Status: DC | PRN
  Administered 2013-11-10 (×4): 10 mL via INTRAVENOUS

## 2013-11-10 MED ORDER — METFORMIN HCL ER 500 MG PO TB24
1000.0000 mg | ORAL_TABLET | Freq: Two times a day (BID) | ORAL | Status: DC
  Administered 2013-11-10 – 2013-11-13 (×6): 1000 mg via ORAL
  Filled 2013-11-10 (×9): qty 2

## 2013-11-10 MED ORDER — FENTANYL CITRATE 0.05 MG/ML IJ SOLN
INTRAMUSCULAR | Status: AC
  Filled 2013-11-10: qty 5

## 2013-11-10 MED ORDER — VENLAFAXINE HCL ER 75 MG PO CP24
75.0000 mg | ORAL_CAPSULE | Freq: Every morning | ORAL | Status: DC
Start: 2013-11-11 — End: 2013-11-13
  Administered 2013-11-11 – 2013-11-12 (×2): 75 mg via ORAL
  Filled 2013-11-10 (×3): qty 1

## 2013-11-10 MED ORDER — FENTANYL CITRATE 0.05 MG/ML IJ SOLN
INTRAMUSCULAR | Status: AC
  Filled 2013-11-10: qty 2

## 2013-11-10 MED ORDER — HYDROMORPHONE HCL PF 1 MG/ML IJ SOLN
0.5000 mg | INTRAMUSCULAR | Status: AC | PRN
  Administered 2013-11-10 (×2): 0.5 mg via INTRAVENOUS

## 2013-11-10 MED ORDER — SODIUM CHLORIDE 0.9 % IR SOLN
Status: DC | PRN
  Administered 2013-11-10: 1000 mL

## 2013-11-10 MED ORDER — NEOSTIGMINE METHYLSULFATE 1 MG/ML IJ SOLN
INTRAMUSCULAR | Status: DC | PRN
  Administered 2013-11-10: 4 mg via INTRAVENOUS

## 2013-11-10 MED ORDER — CEFAZOLIN SODIUM-DEXTROSE 2-3 GM-% IV SOLR
2.0000 g | Freq: Four times a day (QID) | INTRAVENOUS | Status: AC
  Administered 2013-11-10 – 2013-11-11 (×2): 2 g via INTRAVENOUS
  Filled 2013-11-10 (×2): qty 50

## 2013-11-10 MED ORDER — GLYCOPYRROLATE 0.2 MG/ML IJ SOLN
INTRAMUSCULAR | Status: AC
  Filled 2013-11-10: qty 3

## 2013-11-10 MED ORDER — ONDANSETRON HCL 4 MG/2ML IJ SOLN
INTRAMUSCULAR | Status: DC | PRN
  Administered 2013-11-10: 4 mg via INTRAVENOUS

## 2013-11-10 MED ORDER — MIDAZOLAM HCL 2 MG/2ML IJ SOLN
0.5000 mg | Freq: Once | INTRAMUSCULAR | Status: AC
  Administered 2013-11-10: 0.5 mg via INTRAVENOUS

## 2013-11-10 MED ORDER — LINACLOTIDE 145 MCG PO CAPS
145.0000 ug | ORAL_CAPSULE | Freq: Every day | ORAL | Status: DC
  Administered 2013-11-10 – 2013-11-12 (×3): 145 ug via ORAL
  Filled 2013-11-10 (×4): qty 1

## 2013-11-10 MED ORDER — SODIUM CHLORIDE 0.9 % IR SOLN
Status: DC | PRN
  Administered 2013-11-10 (×2): 1000 mL

## 2013-11-10 MED ORDER — ONDANSETRON HCL 4 MG/2ML IJ SOLN
INTRAMUSCULAR | Status: AC
  Filled 2013-11-10: qty 2

## 2013-11-10 MED ORDER — POTASSIUM CHLORIDE IN NACL 20-0.9 MEQ/L-% IV SOLN
INTRAVENOUS | Status: DC
  Administered 2013-11-10: 19:00:00 via INTRAVENOUS
  Filled 2013-11-10 (×3): qty 1000

## 2013-11-10 MED ORDER — FENTANYL CITRATE 0.05 MG/ML IJ SOLN: 50.0000 ug | INTRAMUSCULAR | Status: DC | PRN

## 2013-11-10 MED ORDER — FENTANYL CITRATE 0.05 MG/ML IJ SOLN
INTRAMUSCULAR | Status: DC | PRN
  Administered 2013-11-10 (×2): 50 ug via INTRAVENOUS
  Administered 2013-11-10: 100 ug via INTRAVENOUS
  Administered 2013-11-10 (×2): 50 ug via INTRAVENOUS
  Administered 2013-11-10 (×2): 100 ug via INTRAVENOUS

## 2013-11-10 MED ORDER — PROPOFOL 10 MG/ML IV BOLUS
INTRAVENOUS | Status: AC
  Filled 2013-11-10: qty 20

## 2013-11-10 MED ORDER — NEOSTIGMINE METHYLSULFATE 1 MG/ML IJ SOLN
INTRAMUSCULAR | Status: AC
  Filled 2013-11-10: qty 10

## 2013-11-10 MED ORDER — FENTANYL CITRATE 0.05 MG/ML IJ SOLN
25.0000 ug | INTRAMUSCULAR | Status: DC | PRN
  Administered 2013-11-10 (×2): 50 ug via INTRAVENOUS
  Administered 2013-11-10 (×2): 25 ug via INTRAVENOUS

## 2013-11-10 MED ORDER — CELECOXIB 200 MG PO CAPS
200.0000 mg | ORAL_CAPSULE | Freq: Two times a day (BID) | ORAL | Status: DC
  Administered 2013-11-10 – 2013-11-12 (×5): 200 mg via ORAL
  Filled 2013-11-10 (×7): qty 1

## 2013-11-10 MED ORDER — ASPIRIN EC 325 MG PO TBEC: 325.0000 mg | DELAYED_RELEASE_TABLET | Freq: Every day | ORAL | Status: DC

## 2013-11-10 MED ORDER — METHOCARBAMOL 500 MG PO TABS
500.0000 mg | ORAL_TABLET | Freq: Four times a day (QID) | ORAL | Status: DC | PRN
  Administered 2013-11-10 – 2013-11-12 (×5): 500 mg via ORAL
  Filled 2013-11-10 (×5): qty 1

## 2013-11-10 MED ORDER — PANTOPRAZOLE SODIUM 40 MG PO TBEC
80.0000 mg | DELAYED_RELEASE_TABLET | Freq: Every day | ORAL | Status: DC
  Administered 2013-11-11 – 2013-11-12 (×2): 80 mg via ORAL
  Filled 2013-11-10 (×2): qty 2

## 2013-11-10 MED ORDER — ROCURONIUM BROMIDE 100 MG/10ML IV SOLN
INTRAVENOUS | Status: DC | PRN
  Administered 2013-11-10: 40 mg via INTRAVENOUS

## 2013-11-10 MED ORDER — BUPIVACAINE LIPOSOME 1.3 % IJ SUSP
20.0000 mL | Freq: Once | INTRAMUSCULAR | Status: DC
  Filled 2013-11-10: qty 20

## 2013-11-10 MED ORDER — BUPIVACAINE LIPOSOME 1.3 % IJ SUSP
INTRAMUSCULAR | Status: DC | PRN
  Administered 2013-11-10: 20 mL

## 2013-11-10 MED ORDER — ESMOLOL HCL 10 MG/ML IV SOLN
INTRAVENOUS | Status: DC | PRN
  Administered 2013-11-10: 30 mg via INTRAVENOUS

## 2013-11-10 MED ORDER — LIDOCAINE HCL (CARDIAC) 20 MG/ML IV SOLN
INTRAVENOUS | Status: AC
  Filled 2013-11-10: qty 5

## 2013-11-10 MED ORDER — METHOCARBAMOL 100 MG/ML IJ SOLN
500.0000 mg | Freq: Four times a day (QID) | INTRAVENOUS | Status: DC | PRN
  Filled 2013-11-10: qty 5

## 2013-11-10 MED ORDER — PIOGLITAZONE HCL 45 MG PO TABS
45.0000 mg | ORAL_TABLET | Freq: Every day | ORAL | Status: DC
  Administered 2013-11-10 – 2013-11-12 (×3): 45 mg via ORAL
  Filled 2013-11-10 (×4): qty 1

## 2013-11-10 MED ORDER — MIDAZOLAM HCL 2 MG/2ML IJ SOLN
INTRAMUSCULAR | Status: AC
  Filled 2013-11-10: qty 2

## 2013-11-10 MED ORDER — ACETAMINOPHEN 650 MG RE SUPP: 650.0000 mg | Freq: Four times a day (QID) | RECTAL | Status: DC | PRN

## 2013-11-10 MED ORDER — BUPIVACAINE HCL (PF) 0.25 % IJ SOLN
INTRAMUSCULAR | Status: AC
  Filled 2013-11-10: qty 30

## 2013-11-10 MED ORDER — GLYCOPYRROLATE 0.2 MG/ML IJ SOLN
INTRAMUSCULAR | Status: DC | PRN
  Administered 2013-11-10: 0.6 mg via INTRAVENOUS

## 2013-11-10 MED ORDER — MIDAZOLAM HCL 5 MG/5ML IJ SOLN
INTRAMUSCULAR | Status: DC | PRN
  Administered 2013-11-10: 2 mg via INTRAVENOUS

## 2013-11-10 MED ORDER — FUROSEMIDE 20 MG PO TABS
20.0000 mg | ORAL_TABLET | Freq: Every day | ORAL | Status: DC
  Administered 2013-11-10 – 2013-11-12 (×3): 20 mg via ORAL
  Filled 2013-11-10 (×4): qty 1

## 2013-11-10 MED ORDER — MIDAZOLAM HCL 2 MG/2ML IJ SOLN
INTRAMUSCULAR | Status: AC
Start: 2013-11-10 — End: 2013-11-11
  Filled 2013-11-10: qty 2

## 2013-11-10 MED ORDER — METOCLOPRAMIDE HCL 5 MG/ML IJ SOLN
5.0000 mg | Freq: Three times a day (TID) | INTRAMUSCULAR | Status: DC | PRN
  Administered 2013-11-11: 10 mg via INTRAVENOUS
  Filled 2013-11-10: qty 2

## 2013-11-10 MED ORDER — PROPOFOL 10 MG/ML IV BOLUS
INTRAVENOUS | Status: DC | PRN
  Administered 2013-11-10: 30 mg via INTRAVENOUS
  Administered 2013-11-10: 200 mg via INTRAVENOUS

## 2013-11-10 MED ORDER — LORCASERIN HCL 10 MG PO TABS: 10.0000 mg | ORAL_TABLET | Freq: Two times a day (BID) | ORAL | Status: DC

## 2013-11-10 MED ORDER — INSULIN ASPART 100 UNIT/ML ~~LOC~~ SOLN
0.0000 [IU] | Freq: Three times a day (TID) | SUBCUTANEOUS | Status: DC
  Administered 2013-11-10 – 2013-11-13 (×8): 2 [IU] via SUBCUTANEOUS

## 2013-11-10 MED ORDER — ONDANSETRON HCL 4 MG/2ML IJ SOLN
4.0000 mg | Freq: Four times a day (QID) | INTRAMUSCULAR | Status: DC | PRN
  Administered 2013-11-11 (×2): 4 mg via INTRAVENOUS
  Filled 2013-11-10: qty 2

## 2013-11-10 MED ORDER — MIDAZOLAM HCL 2 MG/2ML IJ SOLN: 1.0000 mg | INTRAMUSCULAR | Status: DC | PRN

## 2013-11-10 MED ORDER — LACTATED RINGERS IV SOLN
INTRAVENOUS | Status: DC
  Administered 2013-11-10: 12:00:00 via INTRAVENOUS

## 2013-11-10 MED ORDER — HYDROMORPHONE HCL PF 1 MG/ML IJ SOLN
0.5000 mg | INTRAMUSCULAR | Status: DC | PRN
  Administered 2013-11-10 – 2013-11-11 (×5): 1 mg via INTRAVENOUS
  Filled 2013-11-10 (×5): qty 1

## 2013-11-10 MED ORDER — BISACODYL 5 MG PO TBEC: 5.0000 mg | DELAYED_RELEASE_TABLET | Freq: Every day | ORAL | Status: DC | PRN

## 2013-11-10 MED ORDER — HYDROCODONE-ACETAMINOPHEN 5-325 MG PO TABS: 1.0000 | ORAL_TABLET | Freq: Four times a day (QID) | ORAL | Status: DC | PRN

## 2013-11-10 MED ORDER — MENTHOL 3 MG MT LOZG
1.0000 | LOZENGE | OROMUCOSAL | Status: DC | PRN
  Administered 2013-11-10 – 2013-11-13 (×2): 3 mg via ORAL
  Filled 2013-11-10 (×2): qty 9

## 2013-11-10 MED ORDER — STERILE WATER FOR IRRIGATION IR SOLN
Status: DC | PRN
  Administered 2013-11-10: 1000 mL

## 2013-11-10 MED ORDER — LACTATED RINGERS IV SOLN
INTRAVENOUS | Status: DC | PRN
  Administered 2013-11-10 (×2): via INTRAVENOUS

## 2013-11-10 MED ORDER — DICYCLOMINE HCL 10 MG PO CAPS
10.0000 mg | ORAL_CAPSULE | Freq: Four times a day (QID) | ORAL | Status: DC | PRN
  Administered 2013-11-13: 10 mg via ORAL
  Filled 2013-11-10: qty 1

## 2013-11-10 MED ORDER — ESTROGENS, CONJUGATED 0.625 MG/GM VA CREA
1.0000 | TOPICAL_CREAM | VAGINAL | Status: DC
  Administered 2013-11-12: 1 via VAGINAL
  Filled 2013-11-10: qty 42.5

## 2013-11-10 MED ORDER — INSULIN ASPART 100 UNIT/ML ~~LOC~~ SOLN: 0.0000 [IU] | Freq: Every day | SUBCUTANEOUS | Status: DC

## 2013-11-10 MED ORDER — PHENOL 1.4 % MT LIQD
1.0000 | OROMUCOSAL | Status: DC | PRN
  Filled 2013-11-10: qty 177

## 2013-11-10 MED ORDER — ASPIRIN EC 325 MG PO TBEC
325.0000 mg | DELAYED_RELEASE_TABLET | Freq: Every day | ORAL | Status: DC
  Administered 2013-11-11 – 2013-11-13 (×3): 325 mg via ORAL
  Filled 2013-11-10 (×4): qty 1

## 2013-11-10 MED ORDER — ACETAMINOPHEN 325 MG PO TABS: 650.0000 mg | ORAL_TABLET | Freq: Four times a day (QID) | ORAL | Status: DC | PRN

## 2013-11-10 MED ORDER — HYDROCODONE-ACETAMINOPHEN 5-325 MG PO TABS
1.0000 | ORAL_TABLET | ORAL | Status: DC | PRN
  Administered 2013-11-10 – 2013-11-13 (×9): 2 via ORAL
  Filled 2013-11-10 (×9): qty 2

## 2013-11-10 MED ORDER — LIDOCAINE HCL (CARDIAC) 20 MG/ML IV SOLN
INTRAVENOUS | Status: DC | PRN
  Administered 2013-11-10: 50 mg via INTRAVENOUS

## 2013-11-10 MED ORDER — METOCLOPRAMIDE HCL 10 MG PO TABS: 5.0000 mg | ORAL_TABLET | Freq: Three times a day (TID) | ORAL | Status: DC | PRN

## 2013-11-10 MED ORDER — DEPLIN 15 MG PO TABS: 15.0000 mg | ORAL_TABLET | Freq: Every day | ORAL | Status: DC

## 2013-11-10 SURGICAL SUPPLY — 72 items
APL SKNCLS STERI-STRIP NONHPOA (GAUZE/BANDAGES/DRESSINGS)
BANDAGE ESMARK 6X9 LF (GAUZE/BANDAGES/DRESSINGS) ×1 IMPLANT
BENZOIN TINCTURE PRP APPL 2/3 (GAUZE/BANDAGES/DRESSINGS) ×1 IMPLANT
BLADE SAG 18X100X1.27 (BLADE) ×4 IMPLANT
BNDG CMPR 9X6 STRL LF SNTH (GAUZE/BANDAGES/DRESSINGS) ×1
BNDG COHESIVE 4X5 TAN STRL (GAUZE/BANDAGES/DRESSINGS) ×1 IMPLANT
BNDG ESMARK 6X9 LF (GAUZE/BANDAGES/DRESSINGS) ×2
BOWL SMART MIX CTS (DISPOSABLE) ×2 IMPLANT
CEMENT BONE SIMPLEX SPEEDSET (Cement) ×4 IMPLANT
COVER SURGICAL LIGHT HANDLE (MISCELLANEOUS) ×2 IMPLANT
CUFF TOURNIQUET SINGLE 34IN LL (TOURNIQUET CUFF) ×2 IMPLANT
DRAPE EXTREMITY T 121X128X90 (DRAPE) ×2 IMPLANT
DRAPE PROXIMA HALF (DRAPES) ×2 IMPLANT
DRAPE U-SHAPE 47X51 STRL (DRAPES) ×2 IMPLANT
DRSG ADAPTIC 3X8 NADH LF (GAUZE/BANDAGES/DRESSINGS) ×1 IMPLANT
DRSG PAD ABDOMINAL 8X10 ST (GAUZE/BANDAGES/DRESSINGS) ×2 IMPLANT
DURAPREP 26ML APPLICATOR (WOUND CARE) ×3 IMPLANT
ELECT CAUTERY BLADE 6.4 (BLADE) ×2 IMPLANT
ELECT REM PT RETURN 9FT ADLT (ELECTROSURGICAL) ×2
ELECTRODE REM PT RTRN 9FT ADLT (ELECTROSURGICAL) ×1 IMPLANT
EVACUATOR 1/8 PVC DRAIN (DRAIN) ×2 IMPLANT
FACESHIELD LNG OPTICON STERILE (SAFETY) ×4 IMPLANT
GLOVE BIOGEL PI IND STRL 6.5 (GLOVE) IMPLANT
GLOVE BIOGEL PI IND STRL 7.0 (GLOVE) ×2 IMPLANT
GLOVE BIOGEL PI IND STRL 8 (GLOVE) IMPLANT
GLOVE BIOGEL PI INDICATOR 6.5 (GLOVE) ×1
GLOVE BIOGEL PI INDICATOR 7.0 (GLOVE) ×2
GLOVE BIOGEL PI INDICATOR 8 (GLOVE) ×2
GLOVE ECLIPSE 6.5 STRL STRAW (GLOVE) ×3 IMPLANT
GLOVE ECLIPSE 7.0 STRL STRAW (GLOVE) ×1 IMPLANT
GLOVE ORTHO TXT STRL SZ7.5 (GLOVE) ×3 IMPLANT
GOWN STRL REUS W/ TWL LRG LVL3 (GOWN DISPOSABLE) ×1 IMPLANT
GOWN STRL REUS W/ TWL XL LVL3 (GOWN DISPOSABLE) ×1 IMPLANT
GOWN STRL REUS W/TWL LRG LVL3 (GOWN DISPOSABLE) ×4
GOWN STRL REUS W/TWL XL LVL3 (GOWN DISPOSABLE) ×6
HANDPIECE INTERPULSE COAX TIP (DISPOSABLE) ×2
IMMOBILIZER KNEE 22 UNIV (SOFTGOODS) ×2 IMPLANT
IMMOBILIZER KNEE 24 THIGH 36 (MISCELLANEOUS) IMPLANT
IMMOBILIZER KNEE 24 UNIV (MISCELLANEOUS)
KIT BASIN OR (CUSTOM PROCEDURE TRAY) ×2 IMPLANT
KIT ROOM TURNOVER OR (KITS) ×2 IMPLANT
KNEE/VIT E POLY LINER LEVEL 1B ×1 IMPLANT
MANIFOLD NEPTUNE II (INSTRUMENTS) ×2 IMPLANT
NDL 18GX1X1/2 (RX/OR ONLY) (NEEDLE) ×1 IMPLANT
NDL 25GX 5/8IN NON SAFETY (NEEDLE) ×1 IMPLANT
NEEDLE 18GX1X1/2 (RX/OR ONLY) (NEEDLE) ×2 IMPLANT
NEEDLE 25GX 5/8IN NON SAFETY (NEEDLE) ×2 IMPLANT
NS IRRIG 1000ML POUR BTL (IV SOLUTION) ×2 IMPLANT
PACK TOTAL JOINT (CUSTOM PROCEDURE TRAY) ×2 IMPLANT
PAD ABD 8X10 STRL (GAUZE/BANDAGES/DRESSINGS) ×1 IMPLANT
PAD ARMBOARD 7.5X6 YLW CONV (MISCELLANEOUS) ×3 IMPLANT
PAD CAST 4YDX4 CTTN HI CHSV (CAST SUPPLIES) ×1 IMPLANT
PADDING CAST COTTON 4X4 STRL (CAST SUPPLIES) ×2
PADDING CAST COTTON 6X4 STRL (CAST SUPPLIES) ×2 IMPLANT
SET HNDPC FAN SPRY TIP SCT (DISPOSABLE) ×1 IMPLANT
SPONGE GAUZE 4X4 12PLY (GAUZE/BANDAGES/DRESSINGS) ×1 IMPLANT
SPONGE GAUZE 4X4 12PLY STER LF (GAUZE/BANDAGES/DRESSINGS) ×1 IMPLANT
STRIP CLOSURE SKIN 1/2X4 (GAUZE/BANDAGES/DRESSINGS) ×3 IMPLANT
SUCTION FRAZIER TIP 10 FR DISP (SUCTIONS) ×2 IMPLANT
SUT MNCRL AB 4-0 PS2 18 (SUTURE) ×2 IMPLANT
SUT MON AB 2-0 CT1 36 (SUTURE) ×2 IMPLANT
SUT VIC AB 0 CT1 27 (SUTURE) ×4
SUT VIC AB 0 CT1 27XBRD ANBCTR (SUTURE) ×1 IMPLANT
SUT VIC AB 1 CTX 36 (SUTURE) ×4
SUT VIC AB 1 CTX36XBRD ANBCTR (SUTURE) IMPLANT
SUT VIC AB 2-0 CT1 27 (SUTURE) ×4
SUT VIC AB 2-0 CT1 TAPERPNT 27 (SUTURE) ×1 IMPLANT
SYR 50ML LL SCALE MARK (SYRINGE) ×2 IMPLANT
SYR CONTROL 10ML LL (SYRINGE) ×2 IMPLANT
TOWEL OR 17X24 6PK STRL BLUE (TOWEL DISPOSABLE) ×2 IMPLANT
TOWEL OR 17X26 10 PK STRL BLUE (TOWEL DISPOSABLE) ×2 IMPLANT
WATER STERILE IRR 1000ML POUR (IV SOLUTION) ×3 IMPLANT

## 2013-11-10 NOTE — Progress Notes (Signed)
Utilization review completed.  
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
## 2013-11-10 NOTE — Progress Notes (Signed)
Patient has calmed down significantly and is able to hold a conversation with me, and doze off some.  She still states her pain is a 10/10. She has received all the new mediation the MD has ordered.

## 2013-11-10 NOTE — Plan of Care (Signed)
Problem: Consults Goal: Diagnosis- Total Joint Replacement Primary Total Knee Right     

## 2013-11-10 NOTE — Anesthesia Postprocedure Evaluation (Signed)
  Anesthesia Post-op Note  Patient: [name]  Procedure(s) Performed: Procedure(s): TOTAL KNEE ARTHROPLASTY (Right)  Patient Location: PACU  Anesthesia Type:General  Level of Consciousness: awake  Airway and Oxygen Therapy: Patient Spontanous Breathing  Post-op Pain: mild  Post-op Assessment: Post-op Vital signs reviewed  Post-op Vital Signs: Reviewed  Complications: No apparent anesthesia complications

## 2013-11-10 NOTE — Interval H&P Note (Signed)
History and Physical Interval Note:  11/10/2013 8:27 AM  [name]  has presented today for surgery, with the diagnosis of DJD Right knee  The various methods of treatment have been discussed with the patient and family. After consideration of risks, benefits and other options for treatment, the patient has consented to  Procedure(s): TOTAL KNEE ARTHROPLASTY (Right) as a surgical intervention .  The patient's history has been reviewed, patient examined, no change in status, stable for surgery.  I have reviewed the patient's chart and labs.  Questions were answered to the patient's satisfaction.     [name] F

## 2013-11-10 NOTE — Anesthesia Preprocedure Evaluation (Signed)
Anesthesia Evaluation  Patient identified by MRN, date of birth, ID band Patient awake    Airway Mallampati: II      Dental   Pulmonary neg pulmonary ROS,  breath sounds clear to auscultation        Cardiovascular hypertension, Rhythm:Regular Rate:Normal     Neuro/Psych    GI/Hepatic Neg liver ROS, hiatal hernia, GERD-  ,  Endo/Other  diabetes  Renal/GU negative Renal ROS     Musculoskeletal   Abdominal   Peds  Hematology   Anesthesia Other Findings   Reproductive/Obstetrics                           Anesthesia Physical Anesthesia Plan  ASA: III  Anesthesia Plan: General   Post-op Pain Management:    Induction: Intravenous  Airway Management Planned: Oral ETT  Additional Equipment:   Intra-op Plan:   Post-operative Plan: Extubation in OR  Informed Consent: I have reviewed the patients History and Physical, chart, labs and discussed the procedure including the risks, benefits and alternatives for the proposed anesthesia with the patient or authorized representative who has indicated his/her understanding and acceptance.   Dental advisory given  Plan Discussed with: CRNA, Anesthesiologist and Surgeon  Anesthesia Plan Comments:         Anesthesia Quick Evaluation

## 2013-11-10 NOTE — Discharge Instructions (Signed)
Total Knee Replacement Care After Refer to this sheet in the next few weeks. These instructions provide you with information on caring for yourself after your procedure. Your caregiver also may give you specific instructions. Your treatment has been planned according to the most current medical practices, but problems sometimes occur. Call your caregiver if you have any problems or questions after your procedure. HOME CARE INSTRUCTIONS  Weight bearing as tolerated.  Take Aspirin 1 tab a day for the next 30 days to prevent blood clots.  May change dressing daily starting on Saturday.  May shower on Monday, but do not soak incision.  May apply ice for up to 20 minutes at a time for pain and swelling.  Follow up appointment in our office in two weeks.    See a physical therapist as directed by your caregiver.  Take over-the-counter or prescription medicines for pain, discomfort, or fever only as directed by your caregiver.  Avoid lifting or driving until you are instructed otherwise.  If you have been sent home with a continuous passive motion machine, use it as directed by your caregiver. SEEK MEDICAL CARE IF:  You have difficulty breathing.  Your wound is red, swollen, or has become increasingly painful.  You have pus draining from your wound.  You have a bad smell coming from your wound.  You have persistent bleeding from your wound.  Your wound breaks open after sutures (stitches) or staples have been removed. SEEK IMMEDIATE MEDICAL CARE IF:   You have a fever.  You have a rash.  You have pain or swelling in your calf or thigh.  You have shortness of breath or chest pain.  Your range of motion in your knee is decreasing rather than increasing. MAKE SURE YOU:   Understand these instructions.  Will watch your condition.  Will get help right away if you are not doing well or get worse. Document Released: 03/01/2005 Document Revised: 02/11/2012 Document Reviewed:  10/01/2011 [facility] Patient Information 2014 [place].

## 2013-11-10 NOTE — Anesthesia Procedure Notes (Signed)
Procedure Name: Intubation Date/Time: 11/10/2013 12:45 PM Performed by: Maude Leriche D Pre-anesthesia Checklist: Patient identified, Emergency Drugs available, Suction available, Patient being monitored and Timeout performed Patient Re-evaluated:Patient Re-evaluated prior to inductionOxygen Delivery Method: Circle system utilized Preoxygenation: Pre-oxygenation with 100% oxygen Intubation Type: IV induction Ventilation: Mask ventilation without difficulty and Oral airway inserted - appropriate to patient size Laryngoscope size: Grade 4 view with miller 2.  grade 2 view with Glidescope. Grade View: Grade II Tube type: Oral Tube size: 7.5 mm Number of attempts: 2 Airway Equipment and Method: Stylet Placement Confirmation: ETT inserted through vocal cords under direct vision,  positive ETCO2 and breath sounds checked- equal and bilateral Secured at: 22 cm Tube secured with: Tape Dental Injury: Teeth and Oropharynx as per pre-operative assessment

## 2013-11-10 NOTE — Transfer of Care (Signed)
Immediate Anesthesia Transfer of Care Note  Patient: [name]  Procedure(s) Performed: Procedure(s): TOTAL KNEE ARTHROPLASTY (Right)  Patient Location: PACU  Anesthesia Type:General  Level of Consciousness: awake  Airway & Oxygen Therapy: Patient Spontanous Breathing and Patient connected to face mask oxygen  Post-op Assessment: Report given to PACU RN and Post -op Vital signs reviewed and stable  Post vital signs: Reviewed and stable  Complications: No apparent anesthesia complications

## 2013-11-10 NOTE — Progress Notes (Signed)
Patient came from OR in excruciating pain. Patient was given ordered PACU medication and still screaming in pain. Dr. Oletta Lamas was notified and orders received.

## 2013-11-10 NOTE — Progress Notes (Signed)
Orthopedic Tech Progress Note Patient Details:  [name] 19-May-1941 [id number]  CPM Right Knee CPM Right Knee: On Right Knee Flexion (Degrees): 60 Right Knee Extension (Degrees): 0 Additional Comments: Trapeze bar   Cammer, Theodoro Parma 11/10/2013, 3:29 PM

## 2013-11-10 NOTE — Progress Notes (Signed)
PHARMACIST - PHYSICIAN ORDER COMMUNICATION  CONCERNING: P&T Medication Policy on Herbal Medications  DESCRIPTION:  This patient's order for:  Deplin, Lorcaserin  has been noted.  This product(s) is classified as an "herbal" or natural product. Due to a lack of definitive safety studies or FDA approval, nonstandard manufacturing practices, plus the potential risk of unknown drug-drug interactions while on inpatient medications, the Pharmacy and Therapeutics Committee does not permit the use of "herbal" or natural products of this type within [facility].   Lorcaserin is currently not on formulary. If deemed necessary, please contact the Pharmacy.   ACTION TAKEN: The pharmacy department is unable to verify this order at this time and your patient has been informed of this safety policy. Please reevaluate patient's clinical condition at discharge and address if the herbal or natural product(s) should be resumed at that time.  If you have any questions, you can reach the pharmacy at [phone].  Oval Linsey. Leitha Schuller, PharmD Clinical Pharmacist - Resident Phone: [phone] Pager: [phone] 11/10/2013 4:55 PM

## 2013-11-10 NOTE — Op Note (Signed)
[name], [name]                ACCOUNT NO.:  [id number]  MEDICAL RECORD NO.:  [id number]  LOCATION:  5N24C                        FACILITY:  [facility]  PHYSICIAN:  Ninetta Lights, M.D. DATE OF BIRTH:  1940/11/18  DATE OF PROCEDURE:  11/10/2013 DATE OF DISCHARGE:                              OPERATIVE REPORT   PREOPERATIVE DIAGNOSES:  Right knee end-stage degenerative arthritis, valgus alignment, bone loss of lateral femoral condyle.  POSTOPERATIVE DIAGNOSES:  Right knee end-stage degenerative arthritis, valgus alignment, bone loss of lateral femoral condyle.  PROCEDURES:  Modified minimally invasive right total knee replacement with Stryker triathlon prosthesis.  Soft tissue balancing.  Cemented pegged CR femoral component.  Cemented #4 tibial component with a 9-mm CS insert.  Cemented resurfacing 32-mm patellar component.  SURGEON:  Ninetta Lights, M.D.  ASSISTANT:  Eula Listen PA, present throughout the entire case and necessary for timely completion of procedure.  ANESTHESIA:  General.  BLOOD LOSS:  Minimal.  SPECIMEN:  None.  CULTURES:  None.  COMPLICATIONS:  None.  DRESSINGS:  Soft compressive knee immobilizer.  DRAINS:  Hemovac x1.  TOURNIQUET TIME:  1 hour.  DESCRIPTION OF PROCEDURE:  The patient was brought to the operating room, placed on the operating table in supine position.  After adequate anesthesia had been obtained, tourniquet applied, prepped and draped in usual sterile fashion.  Exsanguinated with elevation of Esmarch. Tourniquet inflated to 350 mmHg.  Straight incision above the patella, down the tibial tubercle.  Skin and subcutaneous tissue were divided. Hemostasis with cautery.  Medial arthrotomy, vastus splitting, preserving quad tendon.  Knee exposed.  Significant changes laterally with bone loss of lateral femoral condyle.  Some osteopenia in the tibia.  Remnants of menisci, ACL debrided.  Distal femur exposed. Intramedullary guide  placed.  Flexible rod.  An 8-mm resection, 5 degrees of valgus.  Using epicondylar axis, femur was sized, cut, and fitted for a pegged CR #3 component.  Proximal tibial resection and extramedullary guide, 3-degree posterior slope cut.  Size #4 component. Patella exposed.  Posterior 10-mm was removed.  Drilled, sized, and fitted for a 32-mm component.  Debris was cleared throughout.  Knee was nicely balanced.  Trials were put in place.  With the 9-mm insert, I was very pleased with full extension, full flexion, good patellar tracking, good stability.  PCL was released from the femoral attachment, part of this was still left intact, but was not inhibiting motion or stability or function.  Tibia was marked for rotation and hand reamed.  Trials were all removed.  Copious irrigation with pulse irrigating device. Cement prepared and placed on all components, firmly seated. Polyethylene attached to tibia, knee reduced.  Patella held with a clamp.  Once cement hardened, the knee was re-examined.  Irrigated once again.  Soft tissue was injected with Exparel.  Hemovac was placed and brought out through a separate stab wound.  Arthrotomy was closed with #1 Vicryl.  Skin and subcutaneous tissue with subcutaneous and subcuticular closure.  Margins were injected with Marcaine.  Sterile compressive dressing applied.  Tourniquet was deflated and removed. Knee immobilizer was applied.  Anesthesia was reversed.  Brought to the recovery room.  Tolerated  the surgery well.  No complications.     Ninetta Lights, M.D.     DFM/MEDQ  D:  11/10/2013  T:  11/10/2013  Job:  [id number]
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
## 2013-11-11 ENCOUNTER — Encounter (HOSPITAL_COMMUNITY): Payer: Self-pay | Admitting: General Practice

## 2013-11-11 HISTORY — PX: TOTAL KNEE ARTHROPLASTY: SHX125

## 2013-11-11 LAB — BASIC METABOLIC PANEL
BUN: 8 mg/dL (ref 6–23)
CHLORIDE: 92 meq/L — AB (ref 96–112)
CO2: 25 meq/L (ref 19–32)
CREATININE: 0.51 mg/dL (ref 0.50–1.10)
Calcium: 8.3 mg/dL — ABNORMAL LOW (ref 8.4–10.5)
GFR calc Af Amer: >90 mL/min (ref >90)
GFR calc non Af Amer: >90 mL/min (ref >90)
Glucose, Bld: 182 mg/dL — ABNORMAL HIGH (ref 70–99)
POTASSIUM: 4.2 meq/L (ref 3.7–5.3)
Sodium: 130 mEq/L — ABNORMAL LOW (ref 137–147)

## 2013-11-11 LAB — GLUCOSE, CAPILLARY
GLUCOSE-CAPILLARY: 196 mg/dL — AB (ref 70–99)
GLUCOSE-CAPILLARY: 193 mg/dL — AB (ref 70–99)
Glucose-Capillary: 152 mg/dL — ABNORMAL HIGH (ref 70–99)
Glucose-Capillary: 184 mg/dL — ABNORMAL HIGH (ref 70–99)
Glucose-Capillary: 169 mg/dL — ABNORMAL HIGH (ref 70–99)

## 2013-11-11 LAB — CBC
HEMATOCRIT: 27.2 % — AB (ref 36.0–46.0)
Hemoglobin: 9.1 g/dL — ABNORMAL LOW (ref 12.0–15.0)
MCH: 29.3 pg (ref 26.0–34.0)
MCHC: 33.5 g/dL (ref 30.0–36.0)
MCV: 87.5 fL (ref 78.0–100.0)
PLATELETS: 447 10*3/uL — AB (ref 150–400)
RBC: 3.11 MIL/uL — AB (ref 3.87–5.11)
RDW: 15.5 % (ref 11.5–15.5)
WBC: 10.9 10*3/uL — AB (ref 4.0–10.5)

## 2013-11-11 MED ORDER — ALUM & MAG HYDROXIDE-SIMETH 200-200-20 MG/5ML PO SUSP
30.0000 mL | ORAL | Status: DC | PRN
Start: 2013-11-11 — End: 2013-11-13
  Administered 2013-11-11: 30 mL via ORAL
  Filled 2013-11-11: qty 30

## 2013-11-11 MED ORDER — CALCIUM CARBONATE ANTACID 500 MG PO CHEW
1.0000 | CHEWABLE_TABLET | ORAL | Status: DC | PRN
Start: 2013-11-11 — End: 2013-11-13
  Administered 2013-11-12: 200 mg via ORAL
  Filled 2013-11-11 (×2): qty 1

## 2013-11-11 NOTE — Progress Notes (Signed)
Subjective: 1 Day Post-Op Procedure(s) (LRB): TOTAL KNEE ARTHROPLASTY (Right) Patient reports pain as 5 on 0-10 scale.  Patient admits to possible anxiety attack last night.  Still in moderate amount of pain.  Some nausea but no vomiting.  Some lightheadedness/dizziness.  No chest pain, pressure, palpitations.  No shortness of breath.  No flatus and no bm as of yet.  Objective: Vital signs in last 24 hours: Temp:  [97.4 F (36.3 C)-98.1 F (36.7 C)] 98 F (36.7 C) (03/19 0546) Pulse Rate:  [74-103] 102 (03/19 0546) Resp:  [10-18] 16 (03/19 0546) BP: (138-177)/(64-89) 155/80 mmHg (03/19 0546) SpO2:  [92 %-100 %] 100 % (03/19 0546) Weight:  [81.647 kg (180 lb)] 81.647 kg (180 lb) (03/18 1132)  Intake/Output from previous day: 03/18 0701 - 03/19 0700 In: 1640 [P.O.:240; I.V.:1400] Out: 250 [Drains:150; Blood:100] Intake/Output this shift:    No results found for this basename: HGB,  in the last 72 hours No results found for this basename: WBC, RBC, HCT, PLT,  in the last 72 hours No results found for this basename: NA, K, CL, CO2, BUN, CREATININE, GLUCOSE, CALCIUM,  in the last 72 hours No results found for this basename: LABPT, INR,  in the last 72 hours  Neurologically intact ABD soft Neurovascular intact Sensation intact distally Intact pulses distally Dorsiflexion/Plantar flexion intact Compartment soft No drainage noted through ace bandage Hemovac drain pulled by me today  Assessment/Plan: 1 Day Post-Op Procedure(s) (LRB): TOTAL KNEE ARTHROPLASTY (Right) Advance diet Up with therapy D/C IV fluids D/C to [facility] most likely on Monday ANTON, M. LINDSEY 11/11/2013, 7:09 AM

## 2013-11-11 NOTE — Progress Notes (Signed)
Patients husband came to visit during the night. The patient became relaxed and had a better night.

## 2013-11-11 NOTE — Progress Notes (Addendum)
Clinical Social Work Department CLINICAL SOCIAL WORK PLACEMENT NOTE 11/11/2013  Patient:  [name], [name]  Account Number:  [id number] Admit date:  11/10/2013  Clinical Social Worker:  Berton Mount, Latanya Presser  Date/time:  11/11/2013 02:30 PM  Clinical Social Work is seeking post-discharge placement for this patient at the following level of care:   SKILLED NURSING   (*CSW will update this form in Epic as items are completed)   11/11/2013  Patient/family provided with [facility] Department of Clinical Social Work's list of facilities offering this level of care within the geographic area requested by the patient (or if unable, by the patient's family).  11/11/2013  Patient/family informed of their freedom to choose among providers that offer the needed level of care, that participate in Medicare, Medicaid or managed care program needed by the patient, have an available bed and are willing to accept the patient.  11/11/2013  Patient/family informed of [facility]' ownership interest in [facility], as well as of the fact that they are under no obligation to receive care at this facility.  PASARR submitted to EDS on 11/11/2013 PASARR number received from EDS on 11/11/2013  FL2 transmitted to all facilities in geographic area requested by pt/family on  11/11/2013 FL2 transmitted to all facilities within larger geographic area on   Patient informed that his/her managed care company has contracts with or will negotiate with  certain facilities, including the following:     Patient/family informed of bed offers received:  11/12/2013 Patient chooses bed at [facility] Physician recommends and patient chooses bed at    Patient to be transferred to [facility] on 11/13/13- [facility], [place]   Patient to be transferred to facility by Husband in personal vehicle- [facility] Rich, [facility]    The following physician request were entered in Epic:   Additional Comments:   [place], [place]

## 2013-11-11 NOTE — Progress Notes (Signed)
Occupational Therapy Evaluation and Discharge Patient Details Name: [name] MRN: [id number] DOB: 02/17/1941 Today's Date: 11/11/2013 Time: 4431-5400 OT Time Calculation (min): 34 min  OT Assessment / Plan / Recommendation History of present illness 73 y/o female s/p R TKA   Clinical Impression   PTA pt lived at home with husband and was Independent in ADLs. Pt has pre-registered to attend [facility] for [facility] rehab following acute stay. Pt c/o nausea and fatigue limiting her ability to perform activities OOB. Limited evaluation completed with pt supine in bed today to assess potential to perform ADLs. Pt with decreased strength, decreased activity tolerance, and decreased ROM in R knee as well as pain which all impair her ability to complete ADLs. Pt would benefit from OT at next venue of care. No further acute OT needs; acute OT to sign off.     OT Assessment  All further OT needs can be met in the next venue of care    Follow Up Recommendations  SNF;Supervision/Assistance - 24 hour       Equipment Recommendations  None recommended by OT          Precautions / Restrictions Precautions Precautions: Fall;Knee Required Braces or Orthoses: Knee Immobilizer - Right Knee Immobilizer - Right: On when out of bed or walking Restrictions Weight Bearing Restrictions: Yes RLE Weight Bearing: Weight bearing as tolerated   Pertinent Vitals/Pain     ADL  Eating/Feeding: Independent Where Assessed - Eating/Feeding: Chair Grooming: Supervision/safety;Set up Where Assessed - Grooming: Supported sitting Upper Body Bathing: Supervision/safety;Set up Where Assessed - Upper Body Bathing: Supported sitting Lower Body Bathing: Moderate assistance Where Assessed - Lower Body Bathing: Supported sitting Upper Body Dressing: Minimal assistance Where Assessed - Upper Body Dressing: Supported sitting Lower Body Dressing: Maximal assistance Where Assessed - Lower Body Dressing: Supported  sitting Transfers/Ambulation Related to ADLs: Per PT note, pt required assistance (mod assist) for bed mobility. Pt too nauseous to assess OOB transfers. ADL Comments: Pt performed ADL activities supine in bed, reported she was too fatigued to get OOB and feeling nauseous.     OT Diagnosis: Generalized weakness;Acute pain  OT Problem List: Decreased strength;Decreased range of motion;Decreased activity tolerance;Impaired balance (sitting and/or standing);Decreased safety awareness;Decreased knowledge of use of DME or AE;Pain      Visit Information  Last OT Received On: 11/11/13 Assistance Needed: +1 History of Present Illness: 73 y/o female s/p R TKA       Prior Functioning     Home Living Family/patient expects to be discharged to:: Skilled nursing facility ([facility]) Living Arrangements: Spouse/significant other Prior Function Level of Independence: Independent Communication Communication: No difficulties         Vision/Perception Vision - History Patient Visual Report: No change from baseline   Cognition  Cognition Arousal/Alertness: Awake/alert Behavior During Therapy: WFL for tasks assessed/performed Overall Cognitive Status: Within Functional Limits for tasks assessed    Extremity/Trunk Assessment Upper Extremity Assessment Upper Extremity Assessment: Overall WFL for tasks assessed Lower Extremity Assessment Lower Extremity Assessment: Defer to PT evaluation RLE Deficits / Details: s/p TKA              End of Session OT - End of Session Equipment Utilized During Treatment: Right knee immobilizer Activity Tolerance: Patient limited by fatigue;Other (comment) (pt limited by nausea) Patient left: in bed;with call bell/phone within reach;with family/visitor present CPM Right Knee CPM Right Knee: Off       Juluis Rainier [phone] 11/11/2013, 4:08 PM

## 2013-11-11 NOTE — Evaluation (Signed)
Physical Therapy Evaluation Patient Details Name: [name] MRN: [id number] DOB: [birth year] Today's Date: 11/11/2013 Time: 8676-1950 PT Time Calculation (min): 31 min  PT Assessment / Plan / Recommendation History of Present Illness  73 y/o female s/p R TKA  Clinical Impression  Pt is s/p  R TKA resulting in the deficits listed below (see PT Problem List). Pt's activity level was limited to pain and feeling nauseous when sitting EOB. Pt will benefit from skilled PT to increase their independence and safety with mobility to allow discharge to the venue listed below.     PT Assessment  Patient needs continued PT services    Follow Up Recommendations  SNF    Does the patient have the potential to tolerate intense rehabilitation      Barriers to Discharge        Equipment Recommendations  Rolling walker with 5" wheels    Recommendations for Other Services OT consult   Frequency 7X/week    Precautions / Restrictions Precautions Precautions: Fall;Knee Precaution Booklet Issued: Yes (comment) Precaution Comments: pt given TKA exercise handout  Required Braces or Orthoses: Knee Immobilizer - Right Knee Immobilizer - Right: On when out of bed or walking Restrictions Weight Bearing Restrictions: Yes RLE Weight Bearing: Weight bearing as tolerated   Pertinent Vitals/Pain Pt reports feeling nauseous and having pain described at 9-10/10      Mobility  Bed Mobility Overal bed mobility: Needs Assistance Bed Mobility: Supine to Sit;Sit to Supine Supine to sit: Mod assist;HOB elevated Sit to supine: Mod assist General bed mobility comments: pt req verbal cues for hand placement and safety as well as mod A for transfer Transfers General transfer comment: unable to assess secondary to nasuea     Exercises General Exercises - Lower Extremity Ankle Circles/Pumps: Strengthening;Right;10 reps;Supine;AROM Quad Sets: AROM;Strengthening;Right;10 reps;Supine   PT Diagnosis:  Generalized weakness;Acute pain  PT Problem List: Decreased strength;Decreased range of motion;Decreased activity tolerance;Decreased balance;Decreased mobility;Decreased coordination;Decreased knowledge of use of DME;Decreased safety awareness PT Treatment Interventions: DME instruction;Gait training;Functional mobility training;Therapeutic activities;Therapeutic exercise;Balance training;Patient/family education     PT Goals(Current goals can be found in the care plan section) Acute Rehab PT Goals Patient Stated Goal: return to SNF PT Goal Formulation: With patient Time For Goal Achievement: 11/18/13 Potential to Achieve Goals: Good  Visit Information  Last PT Received On: 11/11/13 Assistance Needed: +1 History of Present Illness: 73 y/o female s/p R TKA       Prior Functioning  Home Living Family/patient expects to be discharged to:: Skilled nursing facility Living Arrangements: Spouse/significant other Prior Function Level of Independence: Needs assistance Comments: pt from home plans to return home after [place] [facility] for rehab  Communication Communication: No difficulties    Cognition  Cognition Arousal/Alertness: Awake/alert Behavior During Therapy: Anxious (pt feeling nauseous) Overall Cognitive Status: Within Functional Limits for tasks assessed    Extremity/Trunk Assessment Upper Extremity Assessment Upper Extremity Assessment: Overall WFL for tasks assessed Lower Extremity Assessment Lower Extremity Assessment: RLE deficits/detail RLE Deficits / Details: s/p TKA RLE:  (30 deg AAROM flexion in supine) RLE Coordination: decreased gross motor   Balance Balance Overall balance assessment: Needs assistance Sitting-balance support: Single extremity supported;Feet supported Sitting balance-Leahy Scale: Poor Sitting balance - Comments: pt able to sit EOB for 15 min required single extremity supported to maintain balance  End of Session PT - End of Session Equipment  Utilized During Treatment: Oxygen;Right knee immobilizer Activity Tolerance: Patient limited by pain;Treatment limited secondary to medical complications (Comment) (pt's  activity limited by feeling nauseous when sitting EOB) Patient left: in bed;with call bell/phone within reach;with family/visitor present Nurse Communication: Mobility status CPM Right Knee CPM Right Knee: Off  GP    Manley Mason, SPT [place], [place], [place] 11/11/2013, 11:01 AM

## 2013-11-11 NOTE — Progress Notes (Signed)
Clinical Social Work Department BRIEF PSYCHOSOCIAL ASSESSMENT 11/11/2013  Patient:  [name], [name]     Account Number:  [id number]     Admit date:  11/10/2013  Clinical Social Worker:  Adair Laundry  Date/Time:  11/11/2013 02:30 PM  Referred by:  Physician  Date Referred:  11/11/2013 Referred for  SNF Placement   Other Referral:   Interview type:  Family Other interview type:   Pt sleepiing during assessment    PSYCHOSOCIAL DATA Living Status:  HUSBAND Admitted from facility:   Level of care:   Primary support name:  Yailine Ballard [phone] Primary support relationship to patient:  SPOUSE Degree of support available:   Pt has supportive family    CURRENT CONCERNS Current Concerns  Post-Acute Placement   Other Concerns:    SOCIAL WORK ASSESSMENT / PLAN CSW informed pt pre-registered at SNF. CSW visited pt room but pt was sleeping. Pt husband was present at bedside and willing to speak with CSW. Pt has already spoken with [facility] about [place] [facility]. CSW explained that [facility] had reached out to CSW and CSW was aware and would now be making official referal. Pt informed CSW that per MD likely dc Saturday. CSW explained insurance authorization and that this may  be a factor but Ronney Lion was already working on this.   Assessment/plan status:  Psychosocial Support/Ongoing Assessment of Needs Other assessment/ plan:   Information/referral to community resources:   None needed    PATIENT'S/FAMILY'S RESPONSE TO PLAN OF CARE: Pt family agreeable for dc to SNF       [place], [place]

## 2013-11-11 NOTE — Progress Notes (Signed)
Patient anxious and complains of nausea. Ativan and Zofran given to patient. Will continue to monitor.

## 2013-11-11 NOTE — Progress Notes (Signed)
Orthopedic Tech Progress Note Patient Details:  [name] Dec 24, 1940 [id number]  Patient ID: [name], female   DOB: December 23, 1940, 73 y.o.   MRN: [id number] Placed pt's lle in cpm @ 0-60 degrees @1950   Hildred Priest 11/11/2013, 7:47 PM

## 2013-11-11 NOTE — Evaluation (Signed)
Physical Therapy Evaluation Patient Details Name: [name] MRN: [id number] DOB: 1941-05-27 Today's Date: 11/11/2013 Time: 5956-3875 PT Time Calculation (min): 31 min  PT Assessment / Plan / Recommendation History of Present Illness  73 y/o female s/p R TKA  Clinical Impression  Pt is s/p  R TKA resulting in the deficits listed below (see PT Problem List). Pt's activity level was limited to pain and feeling nauseous when sitting EOB. Pt will benefit from skilled PT to increase their independence and safety with mobility to allow discharge to the venue listed below.     PT Assessment  Patient needs continued PT services    Follow Up Recommendations  SNF    Does the patient have the potential to tolerate intense rehabilitation      Barriers to Discharge        Equipment Recommendations  Rolling walker with 5" wheels    Recommendations for Other Services     Frequency 7X/week    Precautions / Restrictions Precautions Precautions: Fall Restrictions Weight Bearing Restrictions: Yes RLE Weight Bearing: Weight bearing as tolerated   Pertinent Vitals/Pain Pt reports feeling nauseous and having pain described at 9-10/10      Mobility  Bed Mobility Overal bed mobility: Needs Assistance Bed Mobility: Supine to Sit Supine to sit: Mod assist General bed mobility comments: pt req verbal cues for hand placement and safety as well as mod A for transfer    Exercises General Exercises - Lower Extremity Ankle Circles/Pumps: Strengthening;Right;10 reps;Supine;AROM Quad Sets: AROM;Strengthening;Right;10 reps;Supine   PT Diagnosis: Generalized weakness;Acute pain  PT Problem List: Decreased strength;Decreased range of motion;Decreased activity tolerance;Decreased balance;Decreased mobility;Decreased coordination;Decreased knowledge of use of DME;Decreased safety awareness PT Treatment Interventions: DME instruction;Gait training;Functional mobility training;Therapeutic  activities;Therapeutic exercise;Balance training;Patient/family education     PT Goals(Current goals can be found in the care plan section) Acute Rehab PT Goals Patient Stated Goal: return to SNF PT Goal Formulation: With patient Time For Goal Achievement: 11/18/13 Potential to Achieve Goals: Good  Visit Information  Last PT Received On: 11/11/13 Assistance Needed: +1 History of Present Illness: 73 y/o female s/p R TKA       Prior Functioning  Home Living Family/patient expects to be discharged to:: Skilled nursing facility Living Arrangements: Spouse/significant other Prior Function Level of Independence: Needs assistance Comments: pt resides in a SNF Communication Communication: No difficulties    Cognition  Cognition Arousal/Alertness: Awake/alert Behavior During Therapy: WFL for tasks assessed/performed (pt feeling nauseous) Overall Cognitive Status: Within Functional Limits for tasks assessed    Extremity/Trunk Assessment Upper Extremity Assessment Upper Extremity Assessment: Overall WFL for tasks assessed Lower Extremity Assessment Lower Extremity Assessment: RLE deficits/detail RLE Deficits / Details: s/p TKA RLE:  (30 deg AAROM flexion in supine) RLE Coordination: decreased gross motor   Balance Balance Overall balance assessment: Needs assistance Sitting-balance support: Single extremity supported;Feet supported Sitting balance-Leahy Scale: Fair Sitting balance - Comments: pt able to sit EOB for 15 min  End of Session PT - End of Session Equipment Utilized During Treatment: Oxygen;Right knee immobilizer Activity Tolerance: Patient limited by pain;Treatment limited secondary to medical complications (Comment) (pt's activity limited by feeling nauseous when sitting EOB) Patient left: in bed;with call bell/phone within reach;with family/visitor present Nurse Communication: Mobility status CPM Right Knee CPM Right Knee: Off  GP     Xzaria Teo  SPT 11/11/2013, 9:39 AM

## 2013-11-12 ENCOUNTER — Encounter (HOSPITAL_COMMUNITY): Payer: Self-pay | Admitting: Orthopedic Surgery

## 2013-11-12 LAB — BASIC METABOLIC PANEL
BUN: 6 mg/dL (ref 6–23)
CALCIUM: 8.2 mg/dL — AB (ref 8.4–10.5)
CHLORIDE: 94 meq/L — AB (ref 96–112)
CO2: 26 mEq/L (ref 19–32)
Creatinine, Ser: 0.56 mg/dL (ref 0.50–1.10)
GFR calc Af Amer: >90 mL/min (ref >90)
GFR calc non Af Amer: >90 mL/min (ref >90)
Glucose, Bld: 159 mg/dL — ABNORMAL HIGH (ref 70–99)
Potassium: 3.9 mEq/L (ref 3.7–5.3)
SODIUM: 132 meq/L — AB (ref 137–147)

## 2013-11-12 LAB — GLUCOSE, CAPILLARY
GLUCOSE-CAPILLARY: 161 mg/dL — AB (ref 70–99)
GLUCOSE-CAPILLARY: 208 mg/dL — AB (ref 70–99)
Glucose-Capillary: 175 mg/dL — ABNORMAL HIGH (ref 70–99)
Glucose-Capillary: 191 mg/dL — ABNORMAL HIGH (ref 70–99)

## 2013-11-12 LAB — CBC
HCT: 25.2 % — ABNORMAL LOW (ref 36.0–46.0)
Hemoglobin: 8.7 g/dL — ABNORMAL LOW (ref 12.0–15.0)
MCH: 30 pg (ref 26.0–34.0)
MCHC: 34.5 g/dL (ref 30.0–36.0)
MCV: 86.9 fL (ref 78.0–100.0)
PLATELETS: 394 10*3/uL (ref 150–400)
RBC: 2.9 MIL/uL — AB (ref 3.87–5.11)
RDW: 15.4 % (ref 11.5–15.5)
WBC: 8.4 10*3/uL (ref 4.0–10.5)

## 2013-11-12 NOTE — Progress Notes (Signed)
PT PROGRESS NOTE  Pt. Making steady progress with PT.  RN made aware that pt. Requesting new TED hose (L one is soiled) and then wants to go back into CPM.  Pt. Encouraged to be OOB with assist at each mea.  Pain appears well managed (see vitals tab).      11/12/13 1539  PT Visit Information  Last PT Received On 11/12/13  Assistance Needed +1  History of Present Illness 73 y/o female s/p R TKA  PT Time Calculation  PT Start Time 1452  PT Stop Time 1532  PT Time Calculation (min) 40 min  Precautions  Precautions Fall;Knee  Required Braces or Orthoses Knee Immobilizer - Right  Knee Immobilizer - Right On when out of bed or walking  Restrictions  Weight Bearing Restrictions Yes  RLE Weight Bearing WBAT  Cognition  Arousal/Alertness Awake/alert  Behavior During Therapy WFL for tasks assessed/performed  Overall Cognitive Status Within Functional Limits for tasks assessed  Bed Mobility  Overal bed mobility Needs Assistance  Bed Mobility Sit to Supine  Sit to supine Min assist;HOB elevated  General bed mobility comments min assist needed to manage R LE back into bed   Transfers  Overall transfer level Needs assistance  Equipment used Rolling walker (2 wheeled)  Transfers Sit to/from Stand  Sit to Stand Min assist  General transfer comment min assist for balance and stability  Ambulation/Gait  Ambulation/Gait assistance Min assist  Ambulation Distance (Feet) 20 Feet  Assistive device Rolling walker (2 wheeled)  Gait Pattern/deviations Step-to pattern  Gait velocity decreased  General Gait Details pt. beginning to be able to self monitor her gait pattern; she tends to step too far up into RW but beginning to self manage  Exercises  Exercises Total Joint  Total Joint Exercises  Ankle Circles/Pumps AROM;Both;15 reps  Quad Sets AROM;Both;15 reps  Short Arc Quad AROM;Right;15 reps  Hip ABduction/ADduction AROM;Right;10 reps  Straight Leg Raises AAROM;Right;10 reps  Knee Flexion  AROM;Right;10 reps;Seated  Goniometric ROM near 0 to 80  PT - End of Session  Equipment Utilized During Treatment Gait belt;Right knee immobilizer  Activity Tolerance Patient tolerated treatment well  Patient left in bed;with call bell/phone within reach (pt. requesting change of TEDs then back into CPM)  Nurse Communication Mobility status (need to apply new TEDS)  PT - Assessment/Plan  PT Plan Current plan remains appropriate  PT Frequency 7X/week  Recommendations for Other Services OT consult  Follow Up Recommendations SNF  PT equipment Rolling walker with 5" wheels  PT Goal Progression  Progress towards PT goals Progressing toward goals  PT General Charges  $$ ACUTE PT VISIT 1 Procedure  PT Treatments  $Gait Training 8-22 mins  $Therapeutic Exercise 23-37 mins  Gerlean Ren PT Acute Rehab Services [phone] [place] [phone]

## 2013-11-12 NOTE — Progress Notes (Signed)
Orthopedic Tech Progress Note Patient Details:  [name] 1941-04-05 [id number]  Patient ID: [name], female   DOB: [birth year], 73 y.o.   MRN: [id number] Placed pt's rle in cpm @, 0-45 degrees; will increase as pt tolerates rn notified

## 2013-11-12 NOTE — Progress Notes (Signed)
Subjective: 2 Days Post-Op Procedure(s) (LRB): TOTAL KNEE ARTHROPLASTY (Right) Patient reports pain as 2 on 0-10 scale.  [name] is feeling much better today.  Some dizziness at rest, but not as bad as yesterday.  No nausea or vomiting today.  Positive flatus, but no bm as of yet.  Tolerating diet.  Objective: Vital signs in last 24 hours: Temp:  [97.9 F (36.6 C)-98 F (36.7 C)] 98 F (36.7 C) (03/20 0546) Pulse Rate:  [92-104] 104 (03/20 0546) Resp:  [14-16] 16 (03/20 0546) BP: (143-160)/(61-71) 143/65 mmHg (03/20 0546) SpO2:  [94 %-99 %] 96 % (03/20 0546)  Intake/Output from previous day: 03/19 0701 - 03/20 0700 In: 840 [P.O.:840] Out: 400 [Urine:400] Intake/Output this shift: Total I/O In: 120 [P.O.:120] Out: -    Recent Labs  11/11/13 0610 11/12/13 0405  HGB 9.1* 8.7*    Recent Labs  11/11/13 0610 11/12/13 0405  WBC 10.9* 8.4  RBC 3.11* 2.90*  HCT 27.2* 25.2*  PLT 447* 394    Recent Labs  11/11/13 0610 11/12/13 0405  NA 130* 132*  K 4.2 3.9  CL 92* 94*  CO2 25 26  BUN 8 6  CREATININE 0.51 0.56  GLUCOSE 182* 159*  CALCIUM 8.3* 8.2*   No results found for this basename: LABPT, INR,  in the last 72 hours  Neurologically intact ABD soft Neurovascular intact Sensation intact distally Intact pulses distally Dorsiflexion/Plantar flexion intact Incision: scant drainage No cellulitis present Compartment soft Dressing changed by me today  Assessment/Plan: 2 Days Post-Op Procedure(s) (LRB): TOTAL KNEE ARTHROPLASTY (Right) Advance diet Up with therapy D/C to [facility].  If they will take her this weekend that is fine, but if not, will definitely go on Monday.    [name] 11/12/2013, 9:23 AM

## 2013-11-12 NOTE — Progress Notes (Signed)
Chaplain responded to consult. Pt spoke about how she was recovering from her knee replacement two days ago. Chaplain offered spiritual support and prayer.

## 2013-11-12 NOTE — Progress Notes (Addendum)
CSW (Clinical Education officer, museum) informed facility that pt will potentially be ready for dc over weekend. Facility awaiting insurance authorization and will notify CSW this afternoon if weekend dc will be possible.   ADDENDUM: Pt has insurance authorization and can dc to [facility].Notified PA and nurse.  [place], [place]

## 2013-11-12 NOTE — Care Management Note (Signed)
CARE MANAGEMENT NOTE 11/12/2013  Patient:  [name], [name]   Account Number:  [id number]  Date Initiated:  11/12/2013  Documentation initiated by:  Ricki Miller  Subjective/Objective Assessment:   73 yr old female s/p right total knee arthroplasty.     Action/Plan:   Patient is from home but will go to [facility] for shortterm rehab. Social worker is aware.   Anticipated DC Date:  11/12/2013   Anticipated DC Plan:  SKILLED NURSING FACILITY  In-house referral  Clinical Social Worker      DC Planning Services  CM consult      Choice offered to / List presented to:             Status of service:  Completed, signed off Medicare Important Message given?   (If response is "NO", the following Medicare IM given date fields will be blank) Date Medicare IM given:   Date Additional Medicare IM given:    Discharge Disposition:  SKILLED NURSING FACILITY  Per UR Regulation:    If discussed at Long Length of Stay Meetings, dates discussed:    Comments:

## 2013-11-12 NOTE — Progress Notes (Signed)
Physical Therapy Treatment Patient Details Name: TAMU GOLZ MRN: [id number] DOB: 10-06-1940 Today's Date: 11/12/2013 Time: 9798-9211 PT Time Calculation (min): 26 min  PT Assessment / Plan / Recommendation  History of Present Illness 72 y/o female s/p R TKA   PT Comments   Pt. With better activity tolerance today with only minimal symptom of slight nausea.  Able to ambulate 80 feet with min assist.  Follow Up Recommendations  SNF     Does the patient have the potential to tolerate intense rehabilitation     Barriers to Discharge        Equipment Recommendations  Rolling walker with 5" wheels    Recommendations for Other Services    Frequency 7X/week   Progress towards PT Goals Progress towards PT goals: Progressing toward goals  Plan Current plan remains appropriate    Precautions / Restrictions Precautions Precautions: Fall;Knee Required Braces or Orthoses: Knee Immobilizer - Right Knee Immobilizer - Right: On when out of bed or walking Restrictions Weight Bearing Restrictions: Yes RLE Weight Bearing: Weight bearing as tolerated   Pertinent Vitals/Pain See vitals tab Pain did not limit her gait significantly    Mobility  Bed Mobility Overal bed mobility: Needs Assistance Bed Mobility: Supine to Sit Supine to sit: Min assist;HOB elevated General bed mobility comments: min assist to manage R LE to transition to edge of bed Transfers Overall transfer level: Needs assistance Equipment used: Rolling walker (2 wheeled) Transfers: Sit to/from Stand Sit to Stand: Mod assist General transfer comment: mod assist to rise to stand with cues for technique and hand placement Ambulation/Gait Ambulation/Gait assistance: Min assist Ambulation Distance (Feet): 80 Feet Assistive device: Rolling walker (2 wheeled) Gait Pattern/deviations: Step-to pattern Gait velocity: decreased Gait velocity interpretation: Below normal speed for age/gender General Gait Details: pt.  needed frequent cues initially for sequencing and safest placement of RW.  Min assist for stability and safety    Exercises     PT Diagnosis:    PT Problem List:   PT Treatment Interventions:     PT Goals (current goals can now be found in the care plan section) Acute Rehab PT Goals Patient Stated Goal: [place] for rehab  Visit Information  Last PT Received On: 11/12/13 Assistance Needed: +1 History of Present Illness: 73 y/o female s/p R TKA    Subjective Data  Subjective: Pt. reports she was dizzy and nauseated yesterday but feeling better today withonly slight nausea. Patient Stated Goal: [place] for rehab   Cognition  Cognition Arousal/Alertness: Awake/alert Behavior During Therapy: WFL for tasks assessed/performed Overall Cognitive Status: Within Functional Limits for tasks assessed    Balance  Balance Overall balance assessment: Needs assistance Sitting balance-Leahy Scale: Good Standing balance support: Bilateral upper extremity supported;During functional activity Standing balance-Leahy Scale: Poor Standing balance comment: rated poor due to use of RW for support  End of Session PT - End of Session Equipment Utilized During Treatment: Gait belt;Right knee immobilizer Activity Tolerance: Patient tolerated treatment well Patient left: in chair;with call bell/phone within reach;with family/visitor present Nurse Communication: Mobility status   GP     Ladona Ridgel 11/12/2013, 2:21 PM Gerlean Ren PT Acute Rehab Services [place] [phone]

## 2013-11-13 LAB — BASIC METABOLIC PANEL
BUN: 8 mg/dL (ref 6–23)
CO2: 26 meq/L (ref 19–32)
Calcium: 9 mg/dL (ref 8.4–10.5)
Chloride: 94 mEq/L — ABNORMAL LOW (ref 96–112)
Creatinine, Ser: 0.58 mg/dL (ref 0.50–1.10)
GFR calc Af Amer: >90 mL/min (ref >90)
GFR, EST NON AFRICAN AMERICAN: 90 mL/min — AB (ref >90)
GLUCOSE: 172 mg/dL — AB (ref 70–99)
POTASSIUM: 3.6 meq/L — AB (ref 3.7–5.3)
Sodium: 133 mEq/L — ABNORMAL LOW (ref 137–147)

## 2013-11-13 LAB — CBC
HEMATOCRIT: 23.4 % — AB (ref 36.0–46.0)
HEMOGLOBIN: 8 g/dL — AB (ref 12.0–15.0)
MCH: 29.7 pg (ref 26.0–34.0)
MCHC: 34.2 g/dL (ref 30.0–36.0)
MCV: 87 fL (ref 78.0–100.0)
Platelets: 389 10*3/uL (ref 150–400)
RBC: 2.69 MIL/uL — ABNORMAL LOW (ref 3.87–5.11)
RDW: 15.6 % — ABNORMAL HIGH (ref 11.5–15.5)
WBC: 10.2 10*3/uL (ref 4.0–10.5)

## 2013-11-13 LAB — GLUCOSE, CAPILLARY
Glucose-Capillary: 173 mg/dL — ABNORMAL HIGH (ref 70–99)
Glucose-Capillary: 139 mg/dL — ABNORMAL HIGH (ref 70–99)

## 2013-11-13 NOTE — Progress Notes (Signed)
Physical Therapy Treatment Patient Details Name: [name] MRN: [id number] DOB: [birth year] Today's Date: 11/13/2013 Time: 0370-4888 PT Time Calculation (min): 17 min  PT Assessment / Plan / Recommendation  History of Present Illness 73 y/o female s/p R TKA   PT Comments   Pt making great progress toward her goals.  Follow Up Recommendations  SNF     Equipment Recommendations  Rolling walker with 5" wheels    Recommendations for Other Services OT consult  Frequency 7X/week   Progress towards PT Goals Progress towards PT goals: Progressing toward goals  Plan Current plan remains appropriate    Precautions / Restrictions Precautions Precautions: Fall;Knee Precaution Comments: reinforced knee education Required Braces or Orthoses: Knee Immobilizer - Right Knee Immobilizer - Right: On when out of bed or walking Restrictions RLE Weight Bearing: Weight bearing as tolerated       Mobility  Bed Mobility Bed Mobility: Supine to Sit Supine to sit: Modified independent (Device/Increase time) Sit to supine: Modified independent (Device/Increase time) General bed mobility comments: bed flat and no rails used. no cues or assistance needed. Transfers Overall transfer level: Needs assistance Equipment used: Rolling walker (2 wheeled) Transfers: Sit to/from Stand Sit to Stand: Supervision General transfer comment: cues on hand and right leg placement with transfers Ambulation/Gait Ambulation/Gait assistance: Supervision Ambulation Distance (Feet): 50 Feet Gait Pattern/deviations: Step-through pattern;Antalgic Gait velocity: decreased Gait velocity interpretation: Below normal speed for age/gender General Gait Details: focued on progressing gait pattern to reciprocal pattern with improved gait speed. no immobilizer used due to pt active with leg raises/demo'd good quad activation/control.    Exercises Total Joint Exercises Ankle Circles/Pumps: AROM;Strengthening;Both;10  reps;Supine Quad Sets: AROM;Strengthening;Both;10 reps;Supine Heel Slides: AROM;Strengthening;Right;10 reps;Supine Straight Leg Raises: AROM;Strengthening;Right;10 reps;Supine Goniometric ROM: AROM right knee supine in bed: 70 degrees flexion      PT Goals (current goals can now be found in the care plan section) Acute Rehab PT Goals Patient Stated Goal: [place] for rehab PT Goal Formulation: With patient Time For Goal Achievement: 11/18/13 Potential to Achieve Goals: Good  Visit Information  Last PT Received On: 11/13/13 Assistance Needed: +1 History of Present Illness: 73 y/o female s/p R TKA    Subjective Data  Patient Stated Goal: [place] for rehab   Cognition  Cognition Arousal/Alertness: Awake/alert Behavior During Therapy: WFL for tasks assessed/performed       End of Session PT - End of Session Equipment Utilized During Treatment: Gait belt Activity Tolerance: Patient tolerated treatment well Patient left: in bed;with family/visitor present;with call bell/phone within reach Nurse Communication: Mobility status   GP     Willow Ora 11/13/2013, 2:13 PM  Willow Ora, PTA Office- [phone]

## 2013-11-13 NOTE — Progress Notes (Signed)
Patient is medically stable for D/C to [facility] today. Per [facility] admissions coordinator at [facility] patient is going to room 1203 on [address]. Clinical Education officer, museum (CSW) prepared D/C packet and gave it to patient's husband. Patient's husband is transporting patient to the facility. Nursing is aware of above. Please reconsult if further social work needs arise. CSW singing off.   Blima Rich, [place] Weekend CSW [phone]

## 2013-11-13 NOTE — Progress Notes (Signed)
Verbal and written discharge instructions provided to patient and family. IV site no diff with removal. Patient given hard prescriptions for discharge to [facility], report called to [place] at [phone].

## 2013-11-15 ENCOUNTER — Other Ambulatory Visit: Payer: Self-pay | Admitting: *Deleted

## 2013-11-15 ENCOUNTER — Encounter: Payer: Self-pay | Admitting: *Deleted

## 2013-11-15 MED ORDER — LORCASERIN HCL 10 MG PO TABS: ORAL_TABLET | ORAL | Status: DC

## 2013-11-15 NOTE — Telephone Encounter (Signed)
[facility] 

## 2013-11-18 ENCOUNTER — Non-Acute Institutional Stay (SKILLED_NURSING_FACILITY): Payer: Medicare HMO | Admitting: Internal Medicine

## 2013-11-18 DIAGNOSIS — E119 Type 2 diabetes mellitus without complications: Secondary | ICD-10-CM

## 2013-11-18 DIAGNOSIS — Z9089 Acquired absence of other organs: Secondary | ICD-10-CM

## 2013-11-18 DIAGNOSIS — K219 Gastro-esophageal reflux disease without esophagitis: Secondary | ICD-10-CM

## 2013-11-18 DIAGNOSIS — Z9889 Other specified postprocedural states: Secondary | ICD-10-CM

## 2013-11-18 DIAGNOSIS — K589 Irritable bowel syndrome without diarrhea: Secondary | ICD-10-CM

## 2013-11-18 DIAGNOSIS — E669 Obesity, unspecified: Secondary | ICD-10-CM

## 2013-11-18 DIAGNOSIS — I10 Essential (primary) hypertension: Secondary | ICD-10-CM

## 2013-11-19 ENCOUNTER — Encounter: Payer: Self-pay | Admitting: Internal Medicine

## 2013-11-19 NOTE — Assessment & Plan Note (Signed)
Continue belviq

## 2013-11-19 NOTE — Assessment & Plan Note (Signed)
Continue lasix;add norvasc 10 mg daily

## 2013-11-19 NOTE — Assessment & Plan Note (Signed)
Continue dexilant 

## 2013-11-19 NOTE — Assessment & Plan Note (Signed)
Actos, glucophage, victoza, lantus 10 units qHS

## 2013-11-19 NOTE — Assessment & Plan Note (Signed)
Pain meds, robaxin and ASA 325 mg daily for prophylaxis

## 2013-11-19 NOTE — Assessment & Plan Note (Signed)
Continue bentyl prn and probiotics
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
## 2013-11-24 ENCOUNTER — Other Ambulatory Visit: Payer: Self-pay | Admitting: *Deleted

## 2013-11-24 MED ORDER — HYDROCODONE-ACETAMINOPHEN 5-325 MG PO TABS
ORAL_TABLET | ORAL | Status: DC
Start: 2013-11-24 — End: 2013-11-26

## 2013-11-24 NOTE — Telephone Encounter (Signed)
[facility] 

## 2013-11-26 ENCOUNTER — Non-Acute Institutional Stay (SKILLED_NURSING_FACILITY): Payer: Medicare HMO | Admitting: Internal Medicine

## 2013-11-26 ENCOUNTER — Encounter: Payer: Self-pay | Admitting: Internal Medicine

## 2013-11-26 DIAGNOSIS — I10 Essential (primary) hypertension: Secondary | ICD-10-CM

## 2013-11-26 DIAGNOSIS — Z9889 Other specified postprocedural states: Secondary | ICD-10-CM

## 2013-11-26 DIAGNOSIS — K589 Irritable bowel syndrome without diarrhea: Secondary | ICD-10-CM

## 2013-11-26 DIAGNOSIS — E119 Type 2 diabetes mellitus without complications: Secondary | ICD-10-CM

## 2013-11-26 DIAGNOSIS — Z9089 Acquired absence of other organs: Secondary | ICD-10-CM

## 2013-11-26 DIAGNOSIS — F329 Major depressive disorder, single episode, unspecified: Secondary | ICD-10-CM

## 2013-11-26 DIAGNOSIS — M545 Low back pain, unspecified: Secondary | ICD-10-CM

## 2013-11-26 DIAGNOSIS — F411 Generalized anxiety disorder: Secondary | ICD-10-CM

## 2013-11-26 DIAGNOSIS — F3289 Other specified depressive episodes: Secondary | ICD-10-CM

## 2013-11-26 DIAGNOSIS — K219 Gastro-esophageal reflux disease without esophagitis: Secondary | ICD-10-CM
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
## 2013-12-01 ENCOUNTER — Ambulatory Visit (INDEPENDENT_AMBULATORY_CARE_PROVIDER_SITE_OTHER): Payer: Medicare HMO | Admitting: Internal Medicine

## 2013-12-01 ENCOUNTER — Encounter: Payer: Self-pay | Admitting: Internal Medicine

## 2013-12-01 VITALS — BP 132/98 | HR 102 | Temp 97.5°F | Ht 62.5 in | Wt 177.0 lb

## 2013-12-01 DIAGNOSIS — E119 Type 2 diabetes mellitus without complications: Secondary | ICD-10-CM

## 2013-12-01 DIAGNOSIS — F3289 Other specified depressive episodes: Secondary | ICD-10-CM

## 2013-12-01 DIAGNOSIS — I10 Essential (primary) hypertension: Secondary | ICD-10-CM

## 2013-12-01 DIAGNOSIS — M545 Low back pain, unspecified: Secondary | ICD-10-CM

## 2013-12-01 DIAGNOSIS — F329 Major depressive disorder, single episode, unspecified: Secondary | ICD-10-CM

## 2013-12-01 MED ORDER — ASPIRIN EC 81 MG PO TBEC: 81.0000 mg | DELAYED_RELEASE_TABLET | Freq: Every day | ORAL | Status: DC

## 2013-12-01 MED ORDER — FUROSEMIDE 20 MG PO TABS: 20.0000 mg | ORAL_TABLET | Freq: Every day | ORAL | Status: DC

## 2013-12-01 MED ORDER — HYDROCODONE-ACETAMINOPHEN 5-325 MG PO TABS: 1.0000 | ORAL_TABLET | Freq: Four times a day (QID) | ORAL | Status: DC | PRN

## 2013-12-01 NOTE — Progress Notes (Signed)
Pre visit review using our clinic review tool, if applicable. No additional management support is needed unless otherwise documented below in the visit note. 

## 2013-12-01 NOTE — Assessment & Plan Note (Signed)
Continue with current prescription therapy as reflected on the Med list.  

## 2013-12-01 NOTE — Progress Notes (Signed)
   Subjective:    HPI   F/u leg swelling x weeks - R>L, s/p TKR R  F/u LBP, neck pain on L - better. The patient presents for a follow-up of  chronic hypertension, chronic dyslipidemia, type 2 diabetes controlled with medicines  Depression is better on Rx   S/p arthroscopic surgery 2014 S/p THR 11/10/13 Dr Percell Miller - [facility] was at [facility], no at home. BP was  Review of Systems  Constitutional: Negative for chills, activity change, appetite change, fatigue and unexpected weight change.  HENT: Negative for congestion, mouth sores and sinus pressure.   Eyes: Negative for visual disturbance.  Respiratory: Negative for cough and chest tightness.   Cardiovascular: Positive for leg swelling.  Gastrointestinal: Positive for constipation. Negative for nausea and abdominal pain.  Genitourinary: Negative for frequency, difficulty urinating and vaginal pain.  Musculoskeletal: Negative for back pain and gait problem.  Skin: Negative for pallor and rash.  Neurological: Negative for dizziness, tremors, weakness, numbness and headaches.  Psychiatric/Behavioral: Negative for suicidal ideas, hallucinations, confusion and sleep disturbance. The patient is nervous/anxious (better, less depressed).      Wt Readings from Last 3 Encounters:  12/01/13 177 lb (80.287 kg)  11/10/13 180 lb (81.647 kg)  11/10/13 180 lb (81.647 kg)   BP Readings from Last 3 Encounters:  12/01/13 132/98  11/26/13 172/93  11/19/13 182/76        Objective:   Physical Exam  Constitutional: She appears well-developed and well-nourished. No distress.  Obese  HENT:  Head: Normocephalic.  Right Ear: External ear normal.  Left Ear: External ear normal.  Nose: Nose normal.  Mouth/Throat: Oropharynx is clear and moist.  Eyes: Conjunctivae are normal. Pupils are equal, round, and reactive to light. Right eye exhibits no discharge. Left eye exhibits no discharge.  Neck: Normal range of motion. Neck supple. No JVD  present. No tracheal deviation present. No thyromegaly present.  Cardiovascular: Normal rate, regular rhythm and normal heart sounds.   Pulmonary/Chest: No stridor. No respiratory distress. She has no wheezes.  Abdominal: Soft. Bowel sounds are normal. She exhibits no distension and no mass. There is no tenderness. There is no rebound and no guarding.  Musculoskeletal: She exhibits no edema and no tenderness.  Lymphadenopathy:    She has no cervical adenopathy.  Neurological: She displays normal reflexes. No cranial nerve deficit. She exhibits normal muscle tone. Coordination normal.  Mild ataxia Non-focal exam  Skin: No rash noted. No erythema.  Psychiatric: She has a normal mood and affect. Her behavior is normal. Judgment and thought content normal.  Better!   Lab Results  Component Value Date   WBC 10.2 11/13/2013   HGB 8.0* 11/13/2013   HCT 23.4* 11/13/2013   PLT 389 11/13/2013   GLUCOSE 172* 11/13/2013   CHOL 116 08/07/2010   TRIG 86.0 08/07/2010   HDL 50.40 08/07/2010   LDLCALC 48 08/07/2010   ALT 19 11/02/2013   AST 27 11/02/2013   NA 133* 11/13/2013   K 3.6* 11/13/2013   CL 94* 11/13/2013   CREATININE 0.58 11/13/2013   BUN 8 11/13/2013   CO2 26 11/13/2013   TSH 1.26 08/07/2010   INR 0.93 11/02/2013   HGBA1C 7.3* 08/07/2010   MICROALBUR 0.2 11/07/2006          Assessment & Plan:

## 2013-12-08 ENCOUNTER — Encounter: Payer: Medicare Other | Admitting: Internal Medicine

## 2013-12-08 ENCOUNTER — Encounter: Payer: Self-pay | Admitting: Internal Medicine

## 2013-12-09 NOTE — Progress Notes (Signed)
Patient ID: [name], female   DOB: 17-Jul-1941, 73 y.o.   MRN: [id number]   Attempted PA for Dexilant with [employer]. Faxed response today was Dexilant was denied through [facility].

## 2013-12-29 ENCOUNTER — Telehealth: Payer: Self-pay

## 2013-12-29 NOTE — Telephone Encounter (Signed)
Left a message for patient to return my call. Dr. Fuller Plan received a letter in the mail stating patient's Dexilant was denied through the insurance and she needs to switch to an alternative medication.

## 2013-12-31 NOTE — Telephone Encounter (Signed)
Left a message for patient to return my call. 

## 2014-01-03 ENCOUNTER — Other Ambulatory Visit: Payer: Self-pay

## 2014-01-03 MED ORDER — LANSOPRAZOLE 30 MG PO CPDR: 30.0000 mg | DELAYED_RELEASE_CAPSULE | Freq: Every day | ORAL | Status: DC

## 2014-01-03 NOTE — Telephone Encounter (Signed)
Patient is going to switch to Prevacid since her insurance company denied Chestnut. Patient has taken Prevacid in the past which has helped patient states she did have some breakthrough reflux symptoms. Told patient to call back if this medication does not help.

## 2014-01-20 ENCOUNTER — Encounter: Payer: Medicare Other | Admitting: Gynecology

## 2014-01-25 ENCOUNTER — Ambulatory Visit (INDEPENDENT_AMBULATORY_CARE_PROVIDER_SITE_OTHER): Payer: Medicare HMO | Admitting: Gynecology

## 2014-01-25 ENCOUNTER — Encounter: Payer: Self-pay | Admitting: Gynecology

## 2014-01-25 VITALS — BP 120/80 | Ht 62.0 in | Wt 167.0 lb

## 2014-01-25 DIAGNOSIS — M899 Disorder of bone, unspecified: Secondary | ICD-10-CM

## 2014-01-25 DIAGNOSIS — M949 Disorder of cartilage, unspecified: Secondary | ICD-10-CM

## 2014-01-25 DIAGNOSIS — N952 Postmenopausal atrophic vaginitis: Secondary | ICD-10-CM

## 2014-01-25 DIAGNOSIS — M8580 Other specified disorders of bone density and structure, unspecified site: Secondary | ICD-10-CM

## 2014-01-25 DIAGNOSIS — N760 Acute vaginitis: Secondary | ICD-10-CM

## 2014-01-25 DIAGNOSIS — Z79890 Hormone replacement therapy (postmenopausal): Secondary | ICD-10-CM

## 2014-01-25 MED ORDER — NONFORMULARY OR COMPOUNDED ITEM: Status: DC

## 2014-01-25 MED ORDER — FLUCONAZOLE 150 MG PO TABS: 150.0000 mg | ORAL_TABLET | Freq: Once | ORAL | Status: DC

## 2014-01-25 MED ORDER — ESTROGENS, CONJUGATED 0.625 MG/GM VA CREA: 1.0000 | TOPICAL_CREAM | VAGINAL | Status: DC

## 2014-01-25 MED ORDER — ESTRADIOL 0.0375 MG/24HR TD PTWK
0.0375 mg | MEDICATED_PATCH | TRANSDERMAL | Status: DC
Start: 2014-01-25 — End: 2014-05-07

## 2014-01-25 NOTE — Progress Notes (Signed)
[name] June 28, 1941 [id number]        72 y.o.  G3P3003 for followup exam.  Several issues noted below.  Past medical history,surgical history, problem list, medications, allergies, family history and social history were all reviewed and documented as reviewed in the EPIC chart.  ROS:  12 system ROS performed with pertinent positives and negatives included in the history, assessment and plan.  Included Systems: General, HEENT, Neck, Cardiovascular, Pulmonary, Gastrointestinal, Genitourinary, Musculoskeletal, Dermatologic, Endocrine, Hematological, Neurologic, Psychiatric Additional significant findings :  None   Exam: Kim assistant Filed Vitals:   01/25/14 1540  BP: 120/80  Height: 5\' 2"  (1.575 m)  Weight: 167 lb (75.751 kg)   General appearance:  Normal affect, orientation and appearance. Skin: Grossly normal HEENT: Without gross lesions.  No cervical or supraclavicular adenopathy. Thyroid normal.  Lungs:  Clear without wheezing, rales or rhonchi Cardiac: RR, without RMG Abdominal:  Soft, nontender, without masses, guarding, rebound, organomegaly or hernia Breasts:  Examined lying and sitting without masses, retractions, discharge or axillary adenopathy. Pelvic:  Ext/BUS/vagina with generalized atrophic changes  Adnexa  Without masses or tenderness    Anus and perineum  Normal   Rectovaginal  Normal sphincter tone without palpated masses or tenderness.    Assessment/Plan:  73 y.o. G43P3003 female for followup exam.   1. Postmenopausal/atrophic genital changes/HRT. Status post TAH for leiomyoma endometriosis.Patient had weaned from her Vivelle last year but had unacceptable hot flashes and reinitiated Climara 0.0375 patch.  Is doing well on this. Also uses 2% testosterone small amount applied to her skin nightly for libido and wants to continue this. Lastly she is using Premarin vaginal cream twice weekly for vaginal support and also wants to continue this as she has had a good  response. I have previously counseled her to the risks benefits of all of the above. I again reviewed the risks to include the WHI study with risk of stroke heart attack DVT and breast cancer. I discussed with advancing age possible significant increased risk of stroke. The issues with testosterone and adverse effects as for his lipids, liver and side effects such as hair growth acne weight also reviewed. To the patient this is a quality of life issue and she wants to continue, clearly understands and accepts the risks and diarrhea filled all 3 of her medications for a year. 2. Pap smear 2012. No Pap smear done today.  No history of significant abnormal Pap smears. Status post hysterectomy for benign indications. Over the age of 6. We both agreed to stop screening per current screening guidelines and she is comfortable with this. 3. Mammography 12/2012.  Due for mammogram now and she knows to schedule this. SBE monthly reviewed.   4. Osteopenia.  DEXA 02/2013 T score -1.4 FRAX 9.6%/1.3%  Increase calcium vitamin D reviewed. Plan repeat DEXA next year or 2 interval  5. Colonoscopy 2013. Repeat at their recommended interval.  6. Health maintenance.   No blood work done as patient has this done through her primary physician's office. Followup in one year, sooner as needed.     Note: This document was prepared with digital dictation and possible smart phrase technology. Any transcriptional errors that result from this process are unintentional.   Anastasio Auerbach MD, 4:33 PM 01/25/2014

## 2014-01-25 NOTE — Patient Instructions (Signed)
Followup in one year for annual exam, sooner if any issues.  Hormone Therapy At menopause, your body begins making less estrogen and progesterone hormones. This causes the body to stop having menstrual periods. This is because estrogen and progesterone hormones control your periods and menstrual cycle. A lack of estrogen may cause symptoms such as:  Hot flushes (or hot flashes).  Vaginal dryness.  Dry skin.  Loss of sex drive.  Risk of bone loss (osteoporosis). When this happens, you may choose to take hormone therapy to get back the estrogen lost during menopause. When the hormone estrogen is given alone, it is usually referred to as ET (Estrogen Therapy). When the hormone progestin is combined with estrogen, it is generally called HT (Hormone Therapy). This was formerly known as hormone replacement therapy (HRT). Your caregiver can help you make a decision on what will be best for you. The decision to use HT seems to change often as new studies are done. Many studies do not agree on the benefits of hormone replacement therapy. LIKELY BENEFITS OF HT INCLUDE PROTECTION FROM:  Hot Flushes (also called hot flashes) - A hot flush is a sudden feeling of heat that spreads over the face and body. The skin may redden like a blush. It is connected with sweats and sleep disturbance. Women going through menopause may have hot flushes a few times a month or several times per day depending on the woman.  Osteoporosis (bone loss)- Estrogen helps guard against bone loss. After menopause, a woman's bones slowly lose calcium and become weak and brittle. As a result, bones are more likely to break. The hip, wrist, and spine are affected most often. Hormone therapy can help slow bone loss after menopause. Weight bearing exercise and taking calcium with vitamin D also can help prevent bone loss. There are also medications that your caregiver can prescribe that can help prevent osteoporosis.  Vaginal Dryness - Loss  of estrogen causes changes in the vagina. Its lining may become thin and dry. These changes can cause pain and bleeding during sexual intercourse. Dryness can also lead to infections. This can cause burning and itching. (Vaginal estrogen treatment can help relieve pain, itching, and dryness.)  Urinary Tract Infections are more common after menopause because of lack of estrogen. Some women also develop urinary incontinence because of low estrogen levels in the vagina and bladder.  Possible other benefits of estrogen include a positive effect on mood and short-term memory in women. RISKS AND COMPLICATIONS  Using estrogen alone without progesterone causes the lining of the uterus to grow. This increases the risk of lining of the uterus (endometrial) cancer. Your caregiver should give another hormone called progestin if you have a uterus.  Women who take combined (estrogen and progestin) HT appear to have an increased risk of breast cancer. The risk appears to be small, but increases throughout the time that HT is taken.  Combined therapy also makes the breast tissue slightly denser which makes it harder to read mammograms (breast X-rays).  Combined, estrogen and progesterone therapy can be taken together every day, in which case there may be spotting of blood. HT therapy can be taken cyclically in which case you will have menstrual periods. Cyclically means HT is taken for a set amount of days, then not taken, then this process is repeated.  HT may increase the risk of stroke, heart attack, breast cancer and forming blood clots in your leg.  Transdermal estrogen (estrogen that is absorbed through the skin  with a patch or a cream) may have more positive results with:  Cholesterol.  Blood pressure.  Blood clots. Having the following conditions may indicate you should not have HT:  Endometrial cancer.  Liver disease.  Breast cancer.  Heart disease.  History of blood  clots.  Stroke. TREATMENT   If you choose to take HT and have a uterus, usually estrogen and progestin are prescribed.  Your caregiver will help you decide the best way to take the medications.  Possible ways to take estrogen include:  Pills.  Patches.  Gels.  Sprays.  Vaginal estrogen cream, rings and tablets.  It is best to take the lowest dose possible that will help your symptoms and take them for the shortest period of time that you can.  Hormone therapy can help relieve some of the problems (symptoms) that affect women at menopause. Before making a decision about HT, talk to your caregiver about what is best for you. Be well informed and comfortable with your decisions. HOME CARE INSTRUCTIONS   Follow your caregivers advice when taking the medications.  A Pap test is done to screen for cervical cancer.  The first Pap test should be done at age 68.  Between ages 29 and 24, Pap tests are repeated every 2 years.  Beginning at age 9, you are advised to have a Pap test every 3 years as long as your past 3 Pap tests have been normal.  Some women have medical problems that increase the chance of getting cervical cancer. Talk to your caregiver about these problems. It is especially important to talk to your caregiver if a new problem develops soon after your last Pap test. In these cases, your caregiver may recommend more frequent screening and Pap tests.  The above recommendations are the same for women who have or have not gotten the vaccine for HPV (Human Papillomavirus).  If you had a hysterectomy for a problem that was not a cancer or a condition that could lead to cancer, then you no longer need Pap tests. However, even if you no longer need a Pap test, a regular exam is a good idea to make sure no other problems are starting.   If you are between ages 88 and 15, and you have had normal Pap tests going back 10 years, you no longer need Pap tests. However, even if you  no longer need a Pap test, a regular exam is a good idea to make sure no other problems are starting.   If you have had past treatment for cervical cancer or a condition that could lead to cancer, you need Pap tests and screening for cancer for at least 20 years after your treatment.  If Pap tests have been discontinued, risk factors (such as a new sexual partner) need to be re-assessed to determine if screening should be resumed.  Some women may need screenings more often if they are at high risk for cervical cancer.  Get mammograms done as per the advice of your caregiver. SEEK IMMEDIATE MEDICAL CARE IF:  You develop abnormal vaginal bleeding.  You have pain or swelling in your legs, shortness of breath, or chest pain.  You develop dizziness or headaches.  You have lumps or changes in your breasts or armpits.  You have slurred speech.  You develop weakness or numbness of your arms or legs.  You have pain, burning, or bleeding when urinating.  You develop abdominal pain. Document Released: 05/11/2003 Document Revised: 11/04/2011 Document Reviewed:  08/29/2010 ExitCare Patient Information 2014 [place].

## 2014-01-26 LAB — URINALYSIS W MICROSCOPIC + REFLEX CULTURE
BACTERIA UA: NONE SEEN
BILIRUBIN URINE: NEGATIVE
CASTS: NONE SEEN
CRYSTALS: NONE SEEN
GLUCOSE, UA: NEGATIVE mg/dL
HGB URINE DIPSTICK: NEGATIVE
KETONES UR: NEGATIVE mg/dL
Leukocytes, UA: NEGATIVE
Nitrite: NEGATIVE
Protein, ur: NEGATIVE mg/dL
SPECIFIC GRAVITY, URINE: 1.011 (ref 1.005–1.030)
Urobilinogen, UA: 0.2 mg/dL (ref 0.0–1.0)
pH: 5 (ref 5.0–8.0)

## 2014-01-28 ENCOUNTER — Telehealth: Payer: Self-pay

## 2014-01-28 ENCOUNTER — Other Ambulatory Visit: Payer: Self-pay

## 2014-01-28 DIAGNOSIS — Z1231 Encounter for screening mammogram for malignant neoplasm of breast: Secondary | ICD-10-CM

## 2014-01-28 NOTE — Telephone Encounter (Signed)
Recent Rx for Testosterone Cream Compound 2% Cream.  Note from pharmacy said "She has been on Testosterone 4% PLO since 2006 and patient said there should not be any change. Ok to keep as 4% 30 Gram with refills?"

## 2014-01-30 NOTE — Telephone Encounter (Signed)
OK with 4 %

## 2014-01-31 ENCOUNTER — Other Ambulatory Visit: Payer: Self-pay | Admitting: Gynecology

## 2014-01-31 MED ORDER — NONFORMULARY OR COMPOUNDED ITEM: Status: DC

## 2014-01-31 NOTE — Telephone Encounter (Signed)
Pharmacy informed. Medication entry corrected.

## 2014-02-03 ENCOUNTER — Other Ambulatory Visit: Payer: Self-pay | Admitting: Internal Medicine

## 2014-02-03 ENCOUNTER — Ambulatory Visit: Payer: Medicare Other | Admitting: Podiatry

## 2014-02-08 ENCOUNTER — Ambulatory Visit
Admission: RE | Admit: 2014-02-08 | Discharge: 2014-02-08 | Disposition: A | Discharge status: Home / Self Care | Payer: Medicare HMO | Source: Ambulatory Visit

## 2014-02-08 DIAGNOSIS — Z1231 Encounter for screening mammogram for malignant neoplasm of breast: Secondary | ICD-10-CM

## 2014-02-10 ENCOUNTER — Ambulatory Visit (INDEPENDENT_AMBULATORY_CARE_PROVIDER_SITE_OTHER): Payer: Medicare HMO | Admitting: Podiatry

## 2014-02-10 ENCOUNTER — Encounter: Payer: Self-pay | Admitting: Podiatry

## 2014-02-10 VITALS — BP 135/84 | HR 75 | Resp 16

## 2014-02-10 DIAGNOSIS — G589 Mononeuropathy, unspecified: Secondary | ICD-10-CM

## 2014-02-10 NOTE — Progress Notes (Signed)
Subjective:     Patient ID: [name], female   DOB: February 24, 1941, 73 y.o.   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

## 2014-02-10 NOTE — Progress Notes (Signed)
   Subjective:    Patient ID: [name], female    DOB: 1940/11/25, 73 y.o.   MRN: [id number]  HPI  Pt states that her neuropathic pain has return since she stopped taking the B-Complex. Wants to restart. She is here for a recheck of neuropathic pain and does have some tingling and pain sensations in both feet.  Review of Systems     Objective:   Physical Exam        Assessment & Plan:

## 2014-03-01 ENCOUNTER — Ambulatory Visit: Payer: Medicare HMO | Admitting: Internal Medicine

## 2014-03-01 ENCOUNTER — Telehealth: Payer: Self-pay

## 2014-03-01 DIAGNOSIS — E119 Type 2 diabetes mellitus without complications: Secondary | ICD-10-CM

## 2014-03-01 NOTE — Telephone Encounter (Signed)
Diabetic bundle- lipid,a1c and bmet ordered 

## 2014-03-09 ENCOUNTER — Ambulatory Visit (INDEPENDENT_AMBULATORY_CARE_PROVIDER_SITE_OTHER): Payer: Medicare HMO | Admitting: Internal Medicine

## 2014-03-09 ENCOUNTER — Encounter: Payer: Self-pay | Admitting: Internal Medicine

## 2014-03-09 VITALS — BP 120/70 | HR 76 | Temp 98.3°F | Resp 16 | Wt 165.0 lb

## 2014-03-09 DIAGNOSIS — F3289 Other specified depressive episodes: Secondary | ICD-10-CM

## 2014-03-09 DIAGNOSIS — F329 Major depressive disorder, single episode, unspecified: Secondary | ICD-10-CM

## 2014-03-09 DIAGNOSIS — D62 Acute posthemorrhagic anemia: Secondary | ICD-10-CM | POA: Insufficient documentation

## 2014-03-09 DIAGNOSIS — E119 Type 2 diabetes mellitus without complications: Secondary | ICD-10-CM

## 2014-03-09 DIAGNOSIS — R945 Abnormal results of liver function studies: Secondary | ICD-10-CM

## 2014-03-09 DIAGNOSIS — K5900 Constipation, unspecified: Secondary | ICD-10-CM

## 2014-03-09 MED ORDER — LINACLOTIDE 290 MCG PO CAPS: 290.0000 ug | ORAL_CAPSULE | Freq: Every day | ORAL | Status: DC | PRN

## 2014-03-09 NOTE — Assessment & Plan Note (Signed)
Per Dr Altheimer

## 2014-03-09 NOTE — Progress Notes (Signed)
Pre visit review using our clinic review tool, if applicable. No additional management support is needed unless otherwise documented below in the visit note. 

## 2014-03-09 NOTE — Progress Notes (Signed)
   Subjective:    HPI  F/u post-op anemia - on iron po, constipation F/u leg swelling x weeks - R>L, resolved  F/u LBP, neck pain on L - better. The patient presents for a follow-up of  chronic hypertension, chronic dyslipidemia, type 2 diabetes controlled with medicines  Depression is better on Rx   S/p arthroscopic surgery 2014 S/p THR 11/10/13 Dr Percell Miller - she was at [facility], no at home. BP was  Review of Systems  Constitutional: Negative for chills, activity change, appetite change, fatigue and unexpected weight change.  HENT: Negative for congestion, mouth sores and sinus pressure.   Eyes: Negative for visual disturbance.  Respiratory: Negative for cough and chest tightness.   Cardiovascular: Positive for leg swelling.  Gastrointestinal: Positive for constipation. Negative for nausea and abdominal pain.  Genitourinary: Negative for frequency, difficulty urinating and vaginal pain.  Musculoskeletal: Negative for back pain and gait problem.  Skin: Negative for pallor and rash.  Neurological: Negative for dizziness, tremors, weakness, numbness and headaches.  Psychiatric/Behavioral: Negative for suicidal ideas, hallucinations, confusion and sleep disturbance. The patient is nervous/anxious (better, less depressed).      Wt Readings from Last 3 Encounters:  03/09/14 165 lb (74.844 kg)  01/25/14 167 lb (75.751 kg)  12/01/13 177 lb (80.287 kg)   BP Readings from Last 3 Encounters:  03/09/14 120/70  02/10/14 135/84  01/25/14 120/80        Objective:   Physical Exam  Constitutional: She appears well-developed and well-nourished. No distress.  Obese  HENT:  Head: Normocephalic.  Right Ear: External ear normal.  Left Ear: External ear normal.  Nose: Nose normal.  Mouth/Throat: Oropharynx is clear and moist.  Eyes: Conjunctivae are normal. Pupils are equal, round, and reactive to light. Right eye exhibits no discharge. Left eye exhibits no discharge.  Neck:  Normal range of motion. Neck supple. No JVD present. No tracheal deviation present. No thyromegaly present.  Cardiovascular: Normal rate, regular rhythm and normal heart sounds.   Pulmonary/Chest: No stridor. No respiratory distress. She has no wheezes.  Abdominal: Soft. Bowel sounds are normal. She exhibits no distension and no mass. There is no tenderness. There is no rebound and no guarding.  Musculoskeletal: She exhibits no edema and no tenderness.  Lymphadenopathy:    She has no cervical adenopathy.  Neurological: She displays normal reflexes. No cranial nerve deficit. She exhibits normal muscle tone. Coordination normal.  Mild ataxia Non-focal exam  Skin: No rash noted. No erythema.  Psychiatric: She has a normal mood and affect. Her behavior is normal. Judgment and thought content normal.  Better!   Lab Results  Component Value Date   WBC 10.2 11/13/2013   HGB 8.0* 11/13/2013   HCT 23.4* 11/13/2013   PLT 389 11/13/2013   GLUCOSE 172* 11/13/2013   CHOL 116 08/07/2010   TRIG 86.0 08/07/2010   HDL 50.40 08/07/2010   LDLCALC 48 08/07/2010   ALT 19 11/02/2013   AST 27 11/02/2013   NA 133* 11/13/2013   K 3.6* 11/13/2013   CL 94* 11/13/2013   CREATININE 0.58 11/13/2013   BUN 8 11/13/2013   CO2 26 11/13/2013   TSH 1.26 08/07/2010   INR 0.93 11/02/2013   HGBA1C 7.3* 08/07/2010   MICROALBUR 0.2 11/07/2006          Assessment & Plan:

## 2014-03-09 NOTE — Assessment & Plan Note (Signed)
linzess prn

## 2014-03-09 NOTE — Assessment & Plan Note (Addendum)
Per Dr Altheimer Hgb 10.0 on po iron Post TKR 2015 Anemia of chronic disease H/o IV Iron Rx  Hem cons if not better in 2 mo

## 2014-03-09 NOTE — Assessment & Plan Note (Signed)
Continue with current prescription therapy as reflected on the Med list.  

## 2014-04-05 ENCOUNTER — Telehealth: Payer: Self-pay

## 2014-04-05 NOTE — Telephone Encounter (Signed)
PA for Linzess was approved until 08/25/2014

## 2014-04-24 ENCOUNTER — Encounter: Payer: Self-pay | Admitting: Internal Medicine

## 2014-05-07 ENCOUNTER — Other Ambulatory Visit: Payer: Self-pay | Admitting: Gynecology

## 2014-05-10 ENCOUNTER — Other Ambulatory Visit (INDEPENDENT_AMBULATORY_CARE_PROVIDER_SITE_OTHER): Payer: Medicare HMO

## 2014-05-10 DIAGNOSIS — D62 Acute posthemorrhagic anemia: Secondary | ICD-10-CM

## 2014-05-10 DIAGNOSIS — E119 Type 2 diabetes mellitus without complications: Secondary | ICD-10-CM

## 2014-05-10 LAB — BASIC METABOLIC PANEL
BUN: 14 mg/dL (ref 6–23)
CO2: 27 meq/L (ref 19–32)
Calcium: 9.3 mg/dL (ref 8.4–10.5)
Chloride: 97 mEq/L (ref 96–112)
Creatinine, Ser: 0.9 mg/dL (ref 0.4–1.2)
GFR: 67.89 mL/min (ref >60.00)
GLUCOSE: 117 mg/dL — AB (ref 70–99)
POTASSIUM: 4.3 meq/L (ref 3.5–5.1)
Sodium: 133 mEq/L — ABNORMAL LOW (ref 135–145)

## 2014-05-10 LAB — IBC PANEL
Iron: 126 ug/dL (ref 42–145)
Saturation Ratios: 31.7 % (ref 20.0–50.0)
Transferrin: 283.8 mg/dL (ref 212.0–360.0)

## 2014-05-10 LAB — HEMOGLOBIN A1C: Hgb A1c MFr Bld: 7.2 % — ABNORMAL HIGH (ref 4.6–6.5)

## 2014-05-10 LAB — LIPID PANEL
CHOLESTEROL: 119 mg/dL (ref 0–200)
HDL: 54.3 mg/dL (ref >39.00)
LDL Cholesterol: 49 mg/dL (ref 0–99)
NonHDL: 64.7
TRIGLYCERIDES: 77 mg/dL (ref 0.0–149.0)
Total CHOL/HDL Ratio: 2
VLDL: 15.4 mg/dL (ref 0.0–40.0)

## 2014-05-11 LAB — CBC WITH DIFFERENTIAL/PLATELET
BASOS ABS: 0 10*3/uL (ref 0.0–0.1)
Basophils Relative: 0.4 % (ref 0.0–3.0)
EOS ABS: 0.3 10*3/uL (ref 0.0–0.7)
Eosinophils Relative: 3.4 % (ref 0.0–5.0)
HEMATOCRIT: 34.9 % — AB (ref 36.0–46.0)
HEMOGLOBIN: 11.6 g/dL — AB (ref 12.0–15.0)
LYMPHS ABS: 2.7 10*3/uL (ref 0.7–4.0)
LYMPHS PCT: 32 % (ref 12.0–46.0)
MCHC: 33.2 g/dL (ref 30.0–36.0)
MCV: 93 fl (ref 78.0–100.0)
MONO ABS: 0.6 10*3/uL (ref 0.1–1.0)
Monocytes Relative: 7.1 % (ref 3.0–12.0)
NEUTROS ABS: 4.8 10*3/uL (ref 1.4–7.7)
Neutrophils Relative %: 57.1 % (ref 43.0–77.0)
Platelets: 476 10*3/uL — ABNORMAL HIGH (ref 150.0–400.0)
RBC: 3.75 Mil/uL — ABNORMAL LOW (ref 3.87–5.11)
RDW: 16.4 % — AB (ref 11.5–15.5)
WBC: 8.5 10*3/uL (ref 4.0–10.5)

## 2014-05-16 ENCOUNTER — Encounter: Payer: Self-pay | Admitting: Internal Medicine

## 2014-05-16 ENCOUNTER — Ambulatory Visit (INDEPENDENT_AMBULATORY_CARE_PROVIDER_SITE_OTHER): Payer: Medicare HMO | Admitting: Internal Medicine

## 2014-05-16 VITALS — BP 150/92 | HR 84 | Temp 97.9°F | Wt 162.0 lb

## 2014-05-16 DIAGNOSIS — D62 Acute posthemorrhagic anemia: Secondary | ICD-10-CM

## 2014-05-16 DIAGNOSIS — Z23 Encounter for immunization: Secondary | ICD-10-CM

## 2014-05-16 DIAGNOSIS — E119 Type 2 diabetes mellitus without complications: Secondary | ICD-10-CM

## 2014-05-16 DIAGNOSIS — F329 Major depressive disorder, single episode, unspecified: Secondary | ICD-10-CM

## 2014-05-16 DIAGNOSIS — F3289 Other specified depressive episodes: Secondary | ICD-10-CM

## 2014-05-16 DIAGNOSIS — I10 Essential (primary) hypertension: Secondary | ICD-10-CM

## 2014-05-16 DIAGNOSIS — F411 Generalized anxiety disorder: Secondary | ICD-10-CM

## 2014-05-16 NOTE — Assessment & Plan Note (Addendum)
Continue with current prescription therapy as reflected on the Med list. Check BP at home  

## 2014-05-16 NOTE — Progress Notes (Signed)
Pre visit review using our clinic review tool, if applicable. No additional management support is needed unless otherwise documented below in the visit note. 

## 2014-05-16 NOTE — Assessment & Plan Note (Signed)
On new meds per Dr Toy Care

## 2014-05-16 NOTE — Assessment & Plan Note (Signed)
Continue with current prescription therapy as reflected on the Med list.  

## 2014-05-16 NOTE — Assessment & Plan Note (Signed)
Cont iron x 3 more mo Better

## 2014-05-16 NOTE — Progress Notes (Signed)
   Subjective:    HPI  F/u post-op anemia - on iron po, constipation F/u leg swelling x weeks - R>L, resolved  F/u LBP, neck pain on L - better. The patient presents for a follow-up of  chronic hypertension, chronic dyslipidemia, type 2 diabetes controlled with medicines  Depression is better on Rx   S/p arthroscopic surgery 2014 S/p THR 11/10/13 Dr Percell Miller  Review of Systems  Constitutional: Negative for chills, activity change, appetite change, fatigue and unexpected weight change.  HENT: Negative for congestion, mouth sores and sinus pressure.   Eyes: Negative for visual disturbance.  Respiratory: Negative for cough and chest tightness.   Cardiovascular: Positive for leg swelling.  Gastrointestinal: Positive for constipation. Negative for nausea and abdominal pain.  Genitourinary: Negative for frequency, difficulty urinating and vaginal pain.  Musculoskeletal: Negative for back pain and gait problem.  Skin: Negative for pallor and rash.  Neurological: Negative for dizziness, tremors, weakness, numbness and headaches.  Psychiatric/Behavioral: Negative for suicidal ideas, hallucinations, confusion and sleep disturbance. The patient is nervous/anxious (better, less depressed).      Wt Readings from Last 3 Encounters:  05/16/14 162 lb (73.483 kg)  03/09/14 165 lb (74.844 kg)  01/25/14 167 lb (75.751 kg)   BP Readings from Last 3 Encounters:  05/16/14 150/92  03/09/14 120/70  02/10/14 135/84        Objective:   Physical Exam  Constitutional: She appears well-developed and well-nourished. No distress.  Obese  HENT:  Head: Normocephalic.  Right Ear: External ear normal.  Left Ear: External ear normal.  Nose: Nose normal.  Mouth/Throat: Oropharynx is clear and moist.  Eyes: Conjunctivae are normal. Pupils are equal, round, and reactive to light. Right eye exhibits no discharge. Left eye exhibits no discharge.  Neck: Normal range of motion. Neck supple. No JVD  present. No tracheal deviation present. No thyromegaly present.  Cardiovascular: Normal rate, regular rhythm and normal heart sounds.   Pulmonary/Chest: No stridor. No respiratory distress. She has no wheezes.  Abdominal: Soft. Bowel sounds are normal. She exhibits no distension and no mass. There is no tenderness. There is no rebound and no guarding.  Musculoskeletal: She exhibits no edema and no tenderness.  Lymphadenopathy:    She has no cervical adenopathy.  Neurological: She displays normal reflexes. No cranial nerve deficit. She exhibits normal muscle tone. Coordination normal.  Mild ataxia Non-focal exam  Skin: No rash noted. No erythema.  Psychiatric: She has a normal mood and affect. Her behavior is normal. Judgment and thought content normal.  Better!   Lab Results  Component Value Date   WBC 8.5 05/10/2014   HGB 11.6* 05/10/2014   HCT 34.9* 05/10/2014   PLT 476.0* 05/10/2014   GLUCOSE 117* 05/10/2014   CHOL 119 05/10/2014   TRIG 77.0 05/10/2014   HDL 54.30 05/10/2014   LDLCALC 49 05/10/2014   ALT 19 11/02/2013   AST 27 11/02/2013   NA 133* 05/10/2014   K 4.3 05/10/2014   CL 97 05/10/2014   CREATININE 0.9 05/10/2014   BUN 14 05/10/2014   CO2 27 05/10/2014   TSH 1.26 08/07/2010   INR 0.93 11/02/2013   HGBA1C 7.2* 05/10/2014   MICROALBUR 0.2 11/07/2006          Assessment & Plan:

## 2014-05-21 ENCOUNTER — Encounter: Payer: Self-pay | Admitting: Gastroenterology

## 2014-06-27 ENCOUNTER — Encounter: Payer: Self-pay | Admitting: Internal Medicine

## 2014-07-12 ENCOUNTER — Ambulatory Visit: Payer: Medicare HMO | Admitting: Internal Medicine

## 2014-08-05 ENCOUNTER — Other Ambulatory Visit: Payer: Self-pay | Admitting: Gynecology

## 2014-08-08 ENCOUNTER — Ambulatory Visit (INDEPENDENT_AMBULATORY_CARE_PROVIDER_SITE_OTHER): Payer: Medicare HMO | Admitting: Internal Medicine

## 2014-08-08 ENCOUNTER — Encounter: Payer: Self-pay | Admitting: Internal Medicine

## 2014-08-08 VITALS — BP 142/90 | HR 83 | Temp 98.0°F | Wt 164.0 lb

## 2014-08-08 DIAGNOSIS — E118 Type 2 diabetes mellitus with unspecified complications: Secondary | ICD-10-CM

## 2014-08-08 DIAGNOSIS — M5431 Sciatica, right side: Secondary | ICD-10-CM | POA: Insufficient documentation

## 2014-08-08 DIAGNOSIS — M1711 Unilateral primary osteoarthritis, right knee: Secondary | ICD-10-CM

## 2014-08-08 MED ORDER — TRAMADOL HCL 50 MG PO TABS: 50.0000 mg | ORAL_TABLET | Freq: Two times a day (BID) | ORAL | Status: DC | PRN

## 2014-08-08 MED ORDER — IBUPROFEN 600 MG PO TABS: ORAL_TABLET | ORAL | Status: DC

## 2014-08-08 NOTE — Assessment & Plan Note (Signed)
Will not use steroids for sciatica

## 2014-08-08 NOTE — Patient Instructions (Signed)
Look up on youtube: "piriformis syndrome", "sciatica" -- stretch Get a green spiky massage ball

## 2014-08-08 NOTE — Assessment & Plan Note (Signed)
12/15 - likely due to piriformis syndrome Rx: Ibuprofen Rx, Tramadol Rx  Potential benefits of a short term opioids and NSAIDs use as well as potential risks (i.e. addiction risk, apnea etc) and complications (i.e. Somnolence, constipation and others) were explained to the patient and were aknowledged.  Stretch

## 2014-08-08 NOTE — Progress Notes (Signed)
Pre visit review using our clinic review tool, if applicable. No additional management support is needed unless otherwise documented below in the visit note. 

## 2014-08-08 NOTE — Assessment & Plan Note (Signed)
Pt is getting a 2nd opinion at [place]

## 2014-08-08 NOTE — Progress Notes (Signed)
Subjective:    HPI  C/o "bad sciatica irrad to R leg" x2 months 9/10 in pain intensity" - Darneisha saw Dr Percell Miller for that. Tramadol helped some  F/u post-op anemia - on iron po, constipation F/u leg swelling x weeks - R>L, resolved   The patient presents for a follow-up of  chronic hypertension, chronic dyslipidemia, type 2 diabetes controlled with medicines  Depression is better on Rx   S/p arthroscopic surgery 2014, 3/15 R TKR Dr Percell Miller  S/p [facility] 11/10/13 Dr Percell Miller   Review of Systems  Constitutional: Negative for chills, activity change, appetite change, fatigue and unexpected weight change.  HENT: Negative for congestion, mouth sores and sinus pressure.   Eyes: Negative for visual disturbance.  Respiratory: Negative for cough and chest tightness.   Cardiovascular: Positive for leg swelling.  Gastrointestinal: Positive for constipation. Negative for nausea and abdominal pain.  Genitourinary: Negative for frequency, difficulty urinating and vaginal pain.  Musculoskeletal: Negative for back pain and gait problem.  Skin: Negative for pallor and rash.  Neurological: Negative for dizziness, tremors, weakness, numbness and headaches.  Psychiatric/Behavioral: Negative for suicidal ideas, hallucinations, confusion and sleep disturbance. The patient is nervous/anxious (better, less depressed).     Wt Readings from Last 3 Encounters:  08/08/14 164 lb (74.39 kg)  05/16/14 162 lb (73.483 kg)  03/09/14 165 lb (74.844 kg)   BP Readings from Last 3 Encounters:  08/08/14 142/90  05/16/14 150/92  03/09/14 120/70        Objective:   Physical Exam  Constitutional: She appears well-developed. No distress.  HENT:  Head: Normocephalic.  Right Ear: External ear normal.  Left Ear: External ear normal.  Nose: Nose normal.  Mouth/Throat: Oropharynx is clear and moist.  Eyes: Conjunctivae are normal. Pupils are equal, round, and reactive to light. Right eye exhibits no discharge.  Left eye exhibits no discharge.  Neck: Normal range of motion. Neck supple. No JVD present. No tracheal deviation present. No thyromegaly present.  Cardiovascular: Normal rate, regular rhythm and normal heart sounds.   Pulmonary/Chest: No stridor. No respiratory distress. She has no wheezes.  Abdominal: Soft. Bowel sounds are normal. She exhibits no distension and no mass. There is no tenderness. There is no rebound and no guarding.  Musculoskeletal: She exhibits tenderness. She exhibits no edema.  Lymphadenopathy:    She has no cervical adenopathy.  Neurological: She displays normal reflexes. No cranial nerve deficit. She exhibits normal muscle tone. Coordination normal.  Skin: No rash noted. No erythema.  Psychiatric: She has a normal mood and affect. Her behavior is normal. Judgment and thought content normal.  Str leg elev is (+) on R  R buttock is very tender R IT band is tender R knee is swollen some, painful laterally, hard to extend Hamstrings are tight on R>>L  Lab Results  Component Value Date   WBC 8.5 05/10/2014   HGB 11.6* 05/10/2014   HCT 34.9* 05/10/2014   PLT 476.0* 05/10/2014   GLUCOSE 117* 05/10/2014   CHOL 119 05/10/2014   TRIG 77.0 05/10/2014   HDL 54.30 05/10/2014   LDLCALC 49 05/10/2014   ALT 19 11/02/2013   AST 27 11/02/2013   NA 133* 05/10/2014   K 4.3 05/10/2014   CL 97 05/10/2014   CREATININE 0.9 05/10/2014   BUN 14 05/10/2014   CO2 27 05/10/2014   TSH 1.26 08/07/2010   INR 0.93 11/02/2013   HGBA1C 7.2* 05/10/2014   MICROALBUR 0.2 11/07/2006  Assessment & Plan:  Patient ID: [name], female   DOB: October 19, 1940, 73 y.o.   MRN: [id number]

## 2014-09-16 ENCOUNTER — Ambulatory Visit: Payer: Medicare HMO | Admitting: Internal Medicine

## 2014-09-19 ENCOUNTER — Ambulatory Visit (INDEPENDENT_AMBULATORY_CARE_PROVIDER_SITE_OTHER): Payer: Medicare HMO | Admitting: Internal Medicine

## 2014-09-19 ENCOUNTER — Encounter: Payer: Self-pay | Admitting: Internal Medicine

## 2014-09-19 VITALS — BP 146/84 | HR 92 | Temp 97.8°F | Wt 162.0 lb

## 2014-09-19 DIAGNOSIS — M5431 Sciatica, right side: Secondary | ICD-10-CM

## 2014-09-19 MED ORDER — TRAMADOL HCL 50 MG PO TABS: 50.0000 mg | ORAL_TABLET | Freq: Four times a day (QID) | ORAL | Status: DC | PRN

## 2014-09-19 NOTE — Assessment & Plan Note (Signed)
12/15 - likely due to piriformis syndrome vs sciatica  "bad sciatica irrad to R leg" x months 8/10 in pain intensity" - Meilani saw Dr Percell Miller for that. Tramadol helped some. [name] would like to go to [place] now  OK Tramadol 50 mg qid

## 2014-09-19 NOTE — Progress Notes (Signed)
Pre visit review using our clinic review tool, if applicable. No additional management support is needed unless otherwise documented below in the visit note. 

## 2014-09-19 NOTE — Progress Notes (Signed)
Subjective:    HPI  C/o "bad sciatica irrad to R leg" x4-6 months 8/10 in pain intensity" - [name] saw Dr Percell Miller for that. Tramadol helped some. 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  HENT: Negative for congestion, mouth sores and sinus pressure.   Eyes: Negative for visual disturbance.  Respiratory: Negative for cough and chest tightness.   Cardiovascular: Positive for leg swelling.  Gastrointestinal: Positive for constipation. Negative for nausea and abdominal pain.  Genitourinary: Negative for frequency, difficulty urinating and vaginal pain.  Musculoskeletal: Negative for back pain and gait problem.  Skin: Negative for pallor and rash.  Neurological: Negative for dizziness, tremors, weakness, numbness and headaches.  Psychiatric/Behavioral: Negative for suicidal ideas, hallucinations, confusion and sleep disturbance. The patient is nervous/anxious (better, less depressed).     Wt Readings from Last 3 Encounters:  09/19/14 162 lb (73.483 kg)  08/08/14 164 lb (74.39 kg)  05/16/14 162 lb (73.483 kg)   BP Readings from Last 3 Encounters:  09/19/14 146/84  08/08/14 142/90  05/16/14 150/92        Objective:   Physical Exam  Constitutional: She appears well-developed. No distress.  HENT:  Head: Normocephalic.  Right Ear: External ear normal.  Left Ear: External ear normal.  Nose: Nose normal.  Mouth/Throat: Oropharynx is clear and moist.  Eyes: Conjunctivae are normal. Pupils are equal, round, and reactive to  light. Right eye exhibits no discharge. Left eye exhibits no discharge.  Neck: Normal range of motion. Neck supple. No JVD present. No tracheal deviation present. No thyromegaly present.  Cardiovascular: Normal rate, regular rhythm and normal heart sounds.   Pulmonary/Chest: No stridor. No respiratory distress. She has no wheezes.  Abdominal: Soft. Bowel sounds are normal. She exhibits no distension and no mass. There is no tenderness. There is no rebound and no guarding.  Musculoskeletal: She exhibits tenderness. She exhibits no edema.  Lymphadenopathy:    She has no cervical adenopathy.  Neurological: She displays normal reflexes. No cranial nerve deficit. She exhibits normal muscle tone. Coordination normal.  Skin: No rash noted. No erythema.  Psychiatric: She has a normal mood and affect. Her behavior is normal. Judgment and thought content normal.  Str leg elev is (+) on R R buttock is very tender R IT band is tender R knee is swollen some, painful laterally, hard to extend Hamstrings are tight on R>>L  Lab Results  Component Value Date   WBC 8.5 05/10/2014   HGB 11.6* 05/10/2014   HCT 34.9* 05/10/2014   PLT 476.0* 05/10/2014   GLUCOSE 117* 05/10/2014   CHOL 119 05/10/2014   TRIG 77.0 05/10/2014   HDL 54.30 05/10/2014   LDLCALC 49 05/10/2014   ALT 19 11/02/2013   AST 27 11/02/2013   NA 133* 05/10/2014   K 4.3 05/10/2014   CL 97 05/10/2014   CREATININE 0.9 05/10/2014   BUN 14 05/10/2014   CO2 27 05/10/2014   TSH 1.26 08/07/2010   INR 0.93 11/02/2013   HGBA1C 7.2* 05/10/2014   MICROALBUR  0.2 11/07/2006          Assessment & Plan:

## 2014-09-30 ENCOUNTER — Telehealth: Payer: Self-pay | Admitting: *Deleted

## 2014-09-30 MED ORDER — FLUCONAZOLE 150 MG PO TABS: 150.0000 mg | ORAL_TABLET | Freq: Once | ORAL | Status: DC

## 2014-09-30 NOTE — Telephone Encounter (Signed)
Rx sent 

## 2014-09-30 NOTE — Telephone Encounter (Signed)
Diflucan 150 mg 1 dose. Office visit if symptoms persist

## 2014-09-30 NOTE — Telephone Encounter (Signed)
Pt is requesting refill for Diflucan tablet, c/o vaginal itching.  last seen in June 2015.

## 2014-10-07 ENCOUNTER — Encounter: Payer: Self-pay | Admitting: Gastroenterology

## 2014-10-18 ENCOUNTER — Ambulatory Visit: Payer: Medicare HMO | Admitting: Gynecology

## 2014-10-25 ENCOUNTER — Ambulatory Visit (INDEPENDENT_AMBULATORY_CARE_PROVIDER_SITE_OTHER): Payer: Medicare HMO | Admitting: Gynecology

## 2014-10-25 ENCOUNTER — Encounter: Payer: Self-pay | Admitting: Gynecology

## 2014-10-25 VITALS — BP 120/70

## 2014-10-25 DIAGNOSIS — B373 Candidiasis of vulva and vagina: Secondary | ICD-10-CM | POA: Diagnosis not present

## 2014-10-25 DIAGNOSIS — N898 Other specified noninflammatory disorders of vagina: Secondary | ICD-10-CM | POA: Diagnosis not present

## 2014-10-25 DIAGNOSIS — B3731 Acute candidiasis of vulva and vagina: Secondary | ICD-10-CM

## 2014-10-25 LAB — WET PREP FOR TRICH, YEAST, CLUE
Clue Cells Wet Prep HPF POC: NONE SEEN
Trich, Wet Prep: NONE SEEN

## 2014-10-25 MED ORDER — FLUCONAZOLE 200 MG PO TABS: 200.0000 mg | ORAL_TABLET | Freq: Every day | ORAL | Status: DC

## 2014-10-25 NOTE — Patient Instructions (Addendum)
Take the Diflucan pill daily for 3 days. Stop your cholesterol medicine while taking the Diflucan. Follow up if your symptoms persist, worsen or recur.

## 2014-10-25 NOTE — Progress Notes (Signed)
[name] 14-Aug-1941 [id number]        74 y.o.  [id number] Had called early February with vaginal irritation was treated over the phone with Diflucan 150 mg. She notes that her symptoms seem to have gotten better but still has a nagging itching and slight discharge. No frequency dysuria or urgency. No odor. Had been on Premarin vaginal cream but stopped it.  Past medical history,surgical history, problem list, medications, allergies, family history and social history were all reviewed and documented in the EPIC chart.  Directed ROS with pertinent positives and negatives documented in the history of present illness/assessment and plan.  Exam: Kim assistant Filed Vitals:   10/25/14 1423  BP: 120/70   General appearance:  Normal External BUS vagina with atrophic changes.  Slight white discharge. Bimanual without masses or tenderness.  Assessment/Plan:  74 y.o. G3P3003 with the above history. Wet prep does show yeast. Will cover with higher dose of Diflucan at 200 mg daily 3 days. Alternatives include Terazole cream also discussed the patient prefers oral medication. Stop cholesterol medication while taking instructions given. Follow up if symptoms persist, worsen or recur.     Anastasio Auerbach MD, 2:41 PM 10/25/2014

## 2014-11-07 ENCOUNTER — Telehealth: Payer: Self-pay | Admitting: *Deleted

## 2014-11-07 MED ORDER — TERCONAZOLE 0.4 % VA CREA: 1.0000 | TOPICAL_CREAM | Freq: Every day | VAGINAL | Status: DC

## 2014-11-07 NOTE — Telephone Encounter (Signed)
Terazol 7 day cream. One application nightly 7 days

## 2014-11-07 NOTE — Telephone Encounter (Signed)
Pt called to follow up from [place] 10/25/14 she took all 3 tablets of diflucan and still c/o itching and burning only. Pt asked if a cream could be given? Please advise

## 2014-11-07 NOTE — Telephone Encounter (Signed)
Rx sent. Patient informed. 

## 2014-11-10 ENCOUNTER — Telehealth: Payer: Self-pay | Admitting: *Deleted

## 2014-11-10 NOTE — Telephone Encounter (Signed)
Pt called to relay external itching, prescribed Terazol 7 day cream on 11/07/14. I advised pt to use some Terazol externally as well.

## 2014-11-14 ENCOUNTER — Telehealth: Payer: Self-pay | Admitting: *Deleted

## 2014-11-14 NOTE — Telephone Encounter (Signed)
Left the below on pt voicemail. 

## 2014-11-14 NOTE — Telephone Encounter (Signed)
Office visit

## 2014-11-14 NOTE — Telephone Encounter (Signed)
Pt has been treated with diflucan 200 mg x 3 days and Terazol 7 day cream. Pt said she is still having external itching very bad. She used Terazol externally as well and no relief. No discharge, pt would like recommendations. Please advise

## 2014-11-15 ENCOUNTER — Encounter: Payer: Self-pay | Admitting: Gynecology

## 2014-11-15 ENCOUNTER — Ambulatory Visit (INDEPENDENT_AMBULATORY_CARE_PROVIDER_SITE_OTHER): Payer: Medicare HMO | Admitting: Gynecology

## 2014-11-15 VITALS — BP 140/80 | Ht 62.0 in | Wt 162.0 lb

## 2014-11-15 DIAGNOSIS — N952 Postmenopausal atrophic vaginitis: Secondary | ICD-10-CM

## 2014-11-15 DIAGNOSIS — L292 Pruritus vulvae: Secondary | ICD-10-CM

## 2014-11-15 LAB — WET PREP FOR TRICH, YEAST, CLUE
Clue Cells Wet Prep HPF POC: NONE SEEN
TRICH WET PREP: NONE SEEN
WBC WET PREP: NONE SEEN
YEAST WET PREP: NONE SEEN

## 2014-11-15 MED ORDER — BETAMETHASONE DIPROPIONATE AUG 0.05 % EX CREA: TOPICAL_CREAM | Freq: Two times a day (BID) | CUTANEOUS | Status: DC

## 2014-11-15 NOTE — Progress Notes (Signed)
[name] 11-17-1940 [id number]        73 y.o.  G3P3003 presents having been seen 10/25/2014 complaining of vaginal irritation and itching. Wet prep was positive for yeast. She was treated with Diflucan for 3 days. Symptoms did not seem to get better. She subsequently was treated over the phone with Terazol 7 day cream. She used this both internal and external. Notes that the external irritation has persisted in fact seems worse now than before. No discharge/odor or urinary symptoms such as frequency dysuria or urgency.  She wonders whether she is allergic to the Terazol.  Past medical history,surgical history, problem list, medications, allergies, family history and social history were all reviewed and documented in the EPIC chart.  Directed ROS with pertinent positives and negatives documented in the history of present illness/assessment and plan.  Exam: Wandra Scot assistant Filed Vitals:   11/15/14 1133  BP: 140/80  Height: 5\' 2"  (1.575 m)  Weight: 162 lb (73.483 kg)   General appearance:  Normal Abdomen soft nontender without masses guarding rebound External BUS vagina with atrophic changes. No significant vulvitis or specific lesions. No significant discharge. Bimanual without masses or tenderness.  Assessment/Plan:  74 y.o. G3P3003 with above history and exam. Wet prep is negative. Question whether she is having allergic reaction to the Terazol. Will treat with Diprolene 0.05% cream externally 2-3 times daily. Follow up if symptoms persist, worsen or recur. She is using Premarin cream for atrophic vaginitis. Has not used it for 1-2 weeks. Will wait for now and then start this next week assuming her irritative changes resolved.    Anastasio Auerbach MD, 12:07 PM 11/15/2014

## 2014-11-15 NOTE — Patient Instructions (Signed)
Apply the steroid cream externally to the irritated areas 2-3 times daily. Call me if the irritation continues.

## 2014-11-15 NOTE — Addendum Note (Signed)
Addended by: Burnett Kanaris on: 11/15/2014 01:19 PM   Modules accepted: Orders

## 2014-12-02 ENCOUNTER — Other Ambulatory Visit: Payer: Self-pay | Admitting: Gynecology

## 2014-12-05 ENCOUNTER — Other Ambulatory Visit: Payer: Self-pay | Admitting: Gynecology

## 2014-12-05 ENCOUNTER — Other Ambulatory Visit: Payer: Self-pay | Admitting: Internal Medicine

## 2014-12-06 NOTE — Telephone Encounter (Signed)
Called into pharmacy

## 2014-12-09 ENCOUNTER — Telehealth: Payer: Self-pay

## 2014-12-09 NOTE — Telephone Encounter (Signed)
Call to introduce AWV; the patient has apt on the 21st of April, but this is a 15 minute fup. Agrees to wellness visit but now she is following up on other health issues. Would prefer to schedule further out. Offered to fup with apt in June and the patient agreed.

## 2014-12-20 ENCOUNTER — Ambulatory Visit: Payer: Medicare HMO | Admitting: Internal Medicine

## 2014-12-23 LAB — HM DIABETES EYE EXAM

## 2015-01-02 ENCOUNTER — Other Ambulatory Visit: Payer: Self-pay | Admitting: Internal Medicine

## 2015-01-04 ENCOUNTER — Other Ambulatory Visit: Payer: Self-pay

## 2015-01-04 MED ORDER — ESTRADIOL 0.0375 MG/24HR TD PTWK: MEDICATED_PATCH | TRANSDERMAL | Status: DC

## 2015-01-07 ENCOUNTER — Other Ambulatory Visit: Payer: Self-pay | Admitting: Gynecology

## 2015-01-11 ENCOUNTER — Encounter: Payer: Self-pay | Admitting: Internal Medicine

## 2015-01-24 ENCOUNTER — Ambulatory Visit: Payer: Medicare HMO | Admitting: Internal Medicine

## 2015-01-26 ENCOUNTER — Telehealth: Payer: Self-pay

## 2015-01-26 NOTE — Telephone Encounter (Signed)
Left 2nd VM regarding AWV as planned for June; Mbr to call back.

## 2015-01-31 ENCOUNTER — Encounter: Payer: Self-pay | Admitting: Internal Medicine

## 2015-01-31 ENCOUNTER — Ambulatory Visit (INDEPENDENT_AMBULATORY_CARE_PROVIDER_SITE_OTHER): Payer: Medicare HMO | Admitting: Internal Medicine

## 2015-01-31 VITALS — BP 130/80 | HR 87 | Temp 97.8°F | Ht 63.5 in | Wt 162.8 lb

## 2015-01-31 DIAGNOSIS — F329 Major depressive disorder, single episode, unspecified: Secondary | ICD-10-CM

## 2015-01-31 DIAGNOSIS — M5441 Lumbago with sciatica, right side: Secondary | ICD-10-CM

## 2015-01-31 DIAGNOSIS — K219 Gastro-esophageal reflux disease without esophagitis: Secondary | ICD-10-CM

## 2015-01-31 DIAGNOSIS — E118 Type 2 diabetes mellitus with unspecified complications: Secondary | ICD-10-CM | POA: Diagnosis not present

## 2015-01-31 DIAGNOSIS — I119 Hypertensive heart disease without heart failure: Secondary | ICD-10-CM | POA: Diagnosis not present

## 2015-01-31 DIAGNOSIS — F411 Generalized anxiety disorder: Secondary | ICD-10-CM

## 2015-01-31 DIAGNOSIS — Z0000 Encounter for general adult medical examination without abnormal findings: Secondary | ICD-10-CM | POA: Diagnosis not present

## 2015-01-31 DIAGNOSIS — F32A Depression, unspecified: Secondary | ICD-10-CM

## 2015-01-31 NOTE — Assessment & Plan Note (Signed)
Mild Furosemide 

## 2015-01-31 NOTE — Patient Instructions (Addendum)
[name] , Thank you for taking time to come for your Medicare Wellness Visit. I appreciate your ongoing commitment to your health goals. Please review the following plan we discussed and let me know if I can assist you in the future.   1. Will fup on health screens once back issues have been resolved.(mammogram; colonoscopy) 2. Defer the PSV 23 to doctor   These are the goals we discussed: Goals    None      This is a list of the screening recommended for you and due dates:  Health Maintenance  Topic Date Due  . Complete foot exam   08/20/1951  . Urine Protein Check  11/07/2007  . Pneumonia vaccines (2 of 2 - PPSV23) 08/04/2014  . Colon Cancer Screening  10/01/2014  . Hemoglobin A1C  11/08/2014  . Flu Shot  03/27/2015  . Eye exam for diabetics  12/23/2015  . Mammogram  02/09/2016  . Tetanus Vaccine  07/01/2022  . DEXA scan (bone density measurement)  Completed  . Shingles Vaccine  Completed     Health Maintenance Adopting a healthy lifestyle and getting preventive care can go a long way to promote health and wellness. Talk with your health care provider about what schedule of regular examinations is right for you. This is a good chance for you to check in with your provider about disease prevention and staying healthy. In between checkups, there are plenty of things you can do on your own. Experts have done a lot of research about which lifestyle changes and preventive measures are most likely to keep you healthy. Ask your health care provider for more information. WEIGHT AND DIET  Eat a healthy diet  Be sure to include plenty of vegetables, fruits, low-fat dairy products, and lean protein.  Do not eat a lot of foods high in solid fats, added sugars, or salt.  Get regular exercise. This is one of the most important things you can do for your health.  Most adults should exercise for at least 150 minutes each week. The exercise should increase your heart rate and make you  sweat (moderate-intensity exercise).  Most adults should also do strengthening exercises at least twice a week. This is in addition to the moderate-intensity exercise.  Maintain a healthy weight  Body mass index (BMI) is a measurement that can be used to identify possible weight problems. It estimates body fat based on height and weight. Your health care provider can help determine your BMI and help you achieve or maintain a healthy weight.  For females 45 years of age and older:   A BMI below 18.5 is considered underweight.  A BMI of 18.5 to 24.9 is normal.  A BMI of 25 to 29.9 is considered overweight.  A BMI of 30 and above is considered obese.  Watch levels of cholesterol and blood lipids  You should start having your blood tested for lipids and cholesterol at 74 years of age, then have this test every 5 years.  You may need to have your cholesterol levels checked more often if:  Your lipid or cholesterol levels are high.  You are older than 74 years of age.  You are at high risk for heart disease.  CANCER SCREENING   Lung Cancer  Lung cancer screening is recommended for adults 44-21 years old who are at high risk for lung cancer because of a history of smoking.  A yearly low-dose CT scan of the lungs is recommended for people who:  Currently smoke.  Have quit within the past 15 years.  Have at least a 30-pack-year history of smoking. A pack year is smoking an average of one pack of cigarettes a day for 1 year.  Yearly screening should continue until it has been 15 years since you quit.  Yearly screening should stop if you develop a health problem that would prevent you from having lung cancer treatment.  Breast Cancer  Practice breast self-awareness. This means understanding how your breasts normally appear and feel.  It also means doing regular breast self-exams. Let your health care provider know about any changes, no matter how small.  If you are in  your 20s or 30s, you should have a clinical breast exam (CBE) by a health care provider every 1-3 years as part of a regular health exam.  If you are 31 or older, have a CBE every year. Also consider having a breast X-ray (mammogram) every year.  If you have a family history of breast cancer, talk to your health care provider about genetic screening.  If you are at high risk for breast cancer, talk to your health care provider about having an MRI and a mammogram every year.  Breast cancer gene (BRCA) assessment is recommended for women who have family members with BRCA-related cancers. BRCA-related cancers include:  Breast.  Ovarian.  Tubal.  Peritoneal cancers.  Results of the assessment will determine the need for genetic counseling and BRCA1 and BRCA2 testing. Cervical Cancer Routine pelvic examinations to screen for cervical cancer are no longer recommended for nonpregnant women who are considered low risk for cancer of the pelvic organs (ovaries, uterus, and vagina) and who do not have symptoms. A pelvic examination may be necessary if you have symptoms including those associated with pelvic infections. Ask your health care provider if a screening pelvic exam is right for you.   The Pap test is the screening test for cervical cancer for women who are considered at risk.  If you had a hysterectomy for a problem that was not cancer or a condition that could lead to cancer, then you no longer need Pap tests.  If you are older than 65 years, and you have had normal Pap tests for the past 10 years, you no longer need to have Pap tests.  If you have had past treatment for cervical cancer or a condition that could lead to cancer, you need Pap tests and screening for cancer for at least 20 years after your treatment.  If you no longer get a Pap test, assess your risk factors if they change (such as having a new sexual partner). This can affect whether you should start being screened  again.  Some women have medical problems that increase their chance of getting cervical cancer. If this is the case for you, your health care provider may recommend more frequent screening and Pap tests.  The human papillomavirus (HPV) test is another test that may be used for cervical cancer screening. The HPV test looks for the virus that can cause cell changes in the cervix. The cells collected during the Pap test can be tested for HPV.  The HPV test can be used to screen women 55 years of age and older. Getting tested for HPV can extend the interval between normal Pap tests from three to five years.  An HPV test also should be used to screen women of any age who have unclear Pap test results.  After 74 years of age, women  should have HPV testing as often as Pap tests.  Colorectal Cancer  This type of cancer can be detected and often prevented.  Routine colorectal cancer screening usually begins at 74 years of age and continues through 74 years of age.  Your health care provider may recommend screening at an earlier age if you have risk factors for colon cancer.  Your health care provider may also recommend using home test kits to check for hidden blood in the stool.  A small camera at the end of a tube can be used to examine your colon directly (sigmoidoscopy or colonoscopy). This is done to check for the earliest forms of colorectal cancer.  Routine screening usually begins at age 73.  Direct examination of the colon should be repeated every 5-10 years through 74 years of age. However, you may need to be screened more often if early forms of precancerous polyps or small growths are found. Skin Cancer  Check your skin from head to toe regularly.  Tell your health care provider about any new moles or changes in moles, especially if there is a change in a mole's shape or color.  Also tell your health care provider if you have a mole that is larger than the size of a pencil  eraser.  Always use sunscreen. Apply sunscreen liberally and repeatedly throughout the day.  Protect yourself by wearing long sleeves, pants, a wide-brimmed hat, and sunglasses whenever you are outside. HEART DISEASE, DIABETES, AND HIGH BLOOD PRESSURE   Have your blood pressure checked at least every 1-2 years. High blood pressure causes heart disease and increases the risk of stroke.  If you are between 59 years and 56 years old, ask your health care provider if you should take aspirin to prevent strokes.  Have regular diabetes screenings. This involves taking a blood sample to check your fasting blood sugar level.  If you are at a normal weight and have a low risk for diabetes, have this test once every three years after 74 years of age.  If you are overweight and have a high risk for diabetes, consider being tested at a younger age or more often. PREVENTING INFECTION  Hepatitis B  If you have a higher risk for hepatitis B, you should be screened for this virus. You are considered at high risk for hepatitis B if:  You were born in a country where hepatitis B is common. Ask your health care provider which countries are considered high risk.  Your parents were born in a high-risk country, and you have not been immunized against hepatitis B (hepatitis B vaccine).  You have HIV or AIDS.  You use needles to inject street drugs.  You live with someone who has hepatitis B.  You have had sex with someone who has hepatitis B.  You get hemodialysis treatment.  You take certain medicines for conditions, including cancer, organ transplantation, and autoimmune conditions. Hepatitis C  Blood testing is recommended for:  Everyone born from [birth year] through 1965.  Anyone with known risk factors for hepatitis C. Sexually transmitted infections (STIs)  You should be screened for sexually transmitted infections (STIs) including gonorrhea and chlamydia if:  You are sexually active and are  younger than 74 years of age.  You are older than 74 years of age and your health care provider tells you that you are at risk for this type of infection.  Your sexual activity has changed since you were last screened and you are at an increased  risk for chlamydia or gonorrhea. Ask your health care provider if you are at risk.  If you do not have HIV, but are at risk, it may be recommended that you take a prescription medicine daily to prevent HIV infection. This is called pre-exposure prophylaxis (PrEP). You are considered at risk if:  You are sexually active and do not regularly use condoms or know the HIV status of your partner(s).  You take drugs by injection.  You are sexually active with a partner who has HIV. Talk with your health care provider about whether you are at high risk of being infected with HIV. If you choose to begin PrEP, you should first be tested for HIV. You should then be tested every 3 months for as long as you are taking PrEP.  PREGNANCY   If you are premenopausal and you may become pregnant, ask your health care provider about preconception counseling.  If you may become pregnant, take 400 to 800 micrograms (mcg) of folic acid every day.  If you want to prevent pregnancy, talk to your health care provider about birth control (contraception). OSTEOPOROSIS AND MENOPAUSE   Osteoporosis is a disease in which the bones lose minerals and strength with aging. This can result in serious bone fractures. Your risk for osteoporosis can be identified using a bone density scan.  If you are 22 years of age or older, or if you are at risk for osteoporosis and fractures, ask your health care provider if you should be screened.  Ask your health care provider whether you should take a calcium or vitamin D supplement to lower your risk for osteoporosis.  Menopause may have certain physical symptoms and risks.  Hormone replacement therapy may reduce some of these symptoms and  risks. Talk to your health care provider about whether hormone replacement therapy is right for you.  HOME CARE INSTRUCTIONS   Schedule regular health, dental, and eye exams.  Stay current with your immunizations.   Do not use any tobacco products including cigarettes, chewing tobacco, or electronic cigarettes.  If you are pregnant, do not drink alcohol.  If you are breastfeeding, limit how much and how often you drink alcohol.  Limit alcohol intake to no more than 1 drink per day for nonpregnant women. One drink equals 12 ounces of beer, 5 ounces of wine, or 1 ounces of hard liquor.  Do not use street drugs.  Do not share needles.  Ask your health care provider for help if you need support or information about quitting drugs.  Tell your health care provider if you often feel depressed.  Tell your health care provider if you have ever been abused or do not feel safe at home. Document Released: 02/25/2011 Document Revised: 12/27/2013 Document Reviewed: 07/14/2013 [facility] Patient Information 2015 [place], [place]. This information is not intended to replace advice given to you by your health care provider. Make sure you discuss any questions you have with your health care provider.

## 2015-01-31 NOTE — Progress Notes (Signed)
Subjective:    HPI  C/o "bad sciatica irrad to R leg" x4-6 months 8/10 in pain intensity" - Janet saw Dr Percell Miller for that. Tramadol helped some. Tambra is seeing Dr Rolena Infante for LBP - (?fusion of R SI joint per pt) surgery is being  discussed  F/u post-op anemia - on iron po, constipation F/u leg swelling x weeks - R>L, resolved  The patient presents for a follow-up of  chronic hypertension, chronic dyslipidemia, type 2 diabetes controlled with medicines  Depression is better on Rx   S/p arthroscopic surgery 2014, 3/15 R TKR Dr Percell Miller  S/p [facility] 11/10/13 Dr Percell Miller   Review of Systems  Constitutional: Negative for chills, activity change, appetite change, fatigue and unexpected weight change.  HENT: Negative for congestion, mouth sores and sinus pressure.   Eyes: Negative for visual disturbance.  Respiratory: Negative for cough and chest tightness.   Cardiovascular: Positive for leg swelling.  Gastrointestinal: Positive for constipation. Negative for nausea and abdominal pain.  Genitourinary: Negative for frequency, difficulty urinating and vaginal pain.  Musculoskeletal: Negative for back pain and gait problem.  Skin: Negative for pallor and rash.  Neurological: Negative for dizziness, tremors, weakness, numbness and headaches.  Psychiatric/Behavioral: Negative for suicidal ideas, hallucinations, confusion and sleep disturbance. The patient is nervous/anxious (better, less depressed).     Wt Readings from Last 3 Encounters:  01/31/15 162 lb 12 oz (73.823 kg)  11/15/14 162 lb (73.483 kg)  09/19/14 162 lb (73.483 kg)   BP Readings from Last 3 Encounters:  01/31/15 130/80  11/15/14 140/80  10/25/14 120/70        Objective:   Physical Exam  Constitutional: She appears well-developed. No distress.  HENT:  Head: Normocephalic.  Right Ear: External ear normal.  Left Ear: External ear normal.  Nose: Nose normal.  Mouth/Throat: Oropharynx is clear and moist.  Eyes:  Conjunctivae are normal. Pupils are equal, round, and reactive to light. Right eye exhibits no discharge. Left eye exhibits no discharge.  Neck: Normal range of motion. Neck supple. No JVD present. No tracheal deviation present. No thyromegaly present.  Cardiovascular: Normal rate, regular rhythm and normal heart sounds.   Pulmonary/Chest: No stridor. No respiratory distress. She has no wheezes.  Abdominal: Soft. Bowel sounds are normal. She exhibits no distension and no mass. There is no tenderness. There is no rebound and no guarding.  Musculoskeletal: She exhibits tenderness. She exhibits no edema.  Lymphadenopathy:    She has no cervical adenopathy.  Neurological: She displays normal reflexes. No cranial nerve deficit. She exhibits normal muscle tone. Coordination normal.  Skin: No rash noted. No erythema.  Psychiatric: She has a normal mood and affect. Her behavior is normal. Judgment and thought content normal.  Str leg elev is (+) on R R buttock is very tender R IT band is tender R knee is swollen some, painful laterally, hard to extend Hamstrings are tight on R>>L  Lab Results  Component Value Date   WBC 8.5 05/10/2014   HGB 11.6* 05/10/2014   HCT 34.9* 05/10/2014   PLT 476.0* 05/10/2014   GLUCOSE 117* 05/10/2014   CHOL 119 05/10/2014   TRIG 77.0 05/10/2014   HDL 54.30 05/10/2014   LDLCALC 49 05/10/2014   ALT 19 11/02/2013   AST 27 11/02/2013   NA 133* 05/10/2014   K 4.3 05/10/2014   CL 97 05/10/2014   CREATININE 0.9 05/10/2014   BUN 14 05/10/2014   CO2 27 05/10/2014   TSH 1.26 08/07/2010  INR 0.93 11/02/2013   HGBA1C 7.2* 05/10/2014   MICROALBUR 0.2 11/07/2006          Assessment & Plan:  Medical screening examination/treatment/procedure(s) were performed by non-physician practitioner and as supervising physician I was immediately available for consultation/collaboration. I agree with above. Walker Kehr, MD

## 2015-01-31 NOTE — Progress Notes (Signed)
Pre visit review using our clinic review tool, if applicable. No additional management support is needed unless otherwise documented below in the visit note. 
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
## 2015-01-31 NOTE — Assessment & Plan Note (Signed)
Victoza, Metformin, Lantus  

## 2015-02-05 NOTE — Assessment & Plan Note (Signed)
Doing well 

## 2015-02-05 NOTE — Assessment & Plan Note (Signed)
Chronic  On Prevacid Dr Toy Care

## 2015-02-05 NOTE — Assessment & Plan Note (Signed)
Chronic Dr Toy Care On prn Xanax

## 2015-02-05 NOTE — Assessment & Plan Note (Signed)
[name] is seeing Dr Rolena Infante for LBP - (?fusion of R SI joint per pt) surgery is being  discussed

## 2015-02-06 ENCOUNTER — Telehealth: Payer: Self-pay

## 2015-02-06 NOTE — Telephone Encounter (Signed)
The patient was seen for AWV;  Stated she would have her mammogram after her planned surgery this year

## 2015-02-20 ENCOUNTER — Other Ambulatory Visit: Payer: Self-pay

## 2015-02-20 DIAGNOSIS — Z1231 Encounter for screening mammogram for malignant neoplasm of breast: Secondary | ICD-10-CM

## 2015-03-06 ENCOUNTER — Telehealth: Payer: Self-pay | Admitting: *Deleted

## 2015-03-06 MED ORDER — FLUCONAZOLE 150 MG PO TABS: 150.0000 mg | ORAL_TABLET | Freq: Once | ORAL | Status: DC

## 2015-03-06 NOTE — Telephone Encounter (Signed)
Pt called back with pharmacy name, Rx sent

## 2015-03-06 NOTE — Telephone Encounter (Signed)
Left message for pt to call to see what pharmacy Rx needs to be sent to.

## 2015-03-06 NOTE — Telephone Encounter (Signed)
Diflucan 150mg #1

## 2015-03-06 NOTE — Telephone Encounter (Signed)
(  You are back up MD) pt has annual scheduled on 03/15/15 with nancy. Pt said she has a terrible yeast infection, itching only. Pt asked if you would be so kind to prescribe a couple of diflucan tablets? Ov offered. Please advise

## 2015-03-15 ENCOUNTER — Encounter: Payer: Self-pay | Admitting: Women's Health

## 2015-03-15 ENCOUNTER — Ambulatory Visit (INDEPENDENT_AMBULATORY_CARE_PROVIDER_SITE_OTHER): Payer: Medicare HMO | Admitting: Women's Health

## 2015-03-15 VITALS — BP 130/80 | Ht 62.0 in | Wt 173.0 lb

## 2015-03-15 DIAGNOSIS — B373 Candidiasis of vulva and vagina: Secondary | ICD-10-CM

## 2015-03-15 DIAGNOSIS — B3731 Acute candidiasis of vulva and vagina: Secondary | ICD-10-CM

## 2015-03-15 DIAGNOSIS — Z79890 Hormone replacement therapy (postmenopausal): Secondary | ICD-10-CM

## 2015-03-15 DIAGNOSIS — M8580 Other specified disorders of bone density and structure, unspecified site: Secondary | ICD-10-CM

## 2015-03-15 DIAGNOSIS — Z13820 Encounter for screening for osteoporosis: Secondary | ICD-10-CM

## 2015-03-15 LAB — WET PREP FOR TRICH, YEAST, CLUE
CLUE CELLS WET PREP: NONE SEEN
TRICH WET PREP: NONE SEEN
WBC, Wet Prep HPF POC: NONE SEEN
YEAST WET PREP: NONE SEEN

## 2015-03-15 MED ORDER — FLUCONAZOLE 150 MG PO TABS: 150.0000 mg | ORAL_TABLET | Freq: Once | ORAL | Status: DC

## 2015-03-15 MED ORDER — TERCONAZOLE 0.4 % VA CREA: 1.0000 | TOPICAL_CREAM | Freq: Every day | VAGINAL | Status: DC

## 2015-03-15 MED ORDER — ESTRADIOL 0.0375 MG/24HR TD PTWK: MEDICATED_PATCH | TRANSDERMAL | Status: DC

## 2015-03-15 NOTE — Patient Instructions (Signed)
A&D ointment apply after showers daily   Health Recommendations for Postmenopausal Women Respected and ongoing research has looked at the most common causes of death, disability, and poor quality of life in postmenopausal women. The causes include heart disease, diseases of blood vessels, diabetes, depression, cancer, and bone loss (osteoporosis). Many things can be done to help lower the chances of developing these and other common problems. CARDIOVASCULAR DISEASE Heart Disease: A heart attack is a medical emergency. Know the signs and symptoms of a heart attack. Below are things women can do to reduce their risk for heart disease.   Do not smoke. If you smoke, quit.  Aim for a healthy weight. Being overweight causes many preventable deaths. Eat a healthy and balanced diet and drink an adequate amount of liquids.  Get moving. Make a commitment to be more physically active. Aim for 30 minutes of activity on most, if not all days of the week.  Eat for heart health. Choose a diet that is low in saturated fat and cholesterol and eliminate trans fat. Include whole grains, vegetables, and fruits. Read and understand the labels on food containers before buying.  Know your numbers. Ask your caregiver to check your blood pressure, cholesterol (total, HDL, LDL, triglycerides) and blood glucose. Work with your caregiver on improving your entire clinical picture.  High blood pressure. Limit or stop your table salt intake (try salt substitute and food seasonings). Avoid salty foods and drinks. Read labels on food containers before buying. Eating well and exercising can help control high blood pressure. STROKE  Stroke is a medical emergency. Stroke may be the result of a blood clot in a blood vessel in the brain or by a brain hemorrhage (bleeding). Know the signs and symptoms of a stroke. To lower the risk of developing a stroke:  Avoid fatty foods.  Quit smoking.  Control your diabetes, blood pressure,  and irregular heart rate. THROMBOPHLEBITIS (BLOOD CLOT) OF THE LEG  Becoming overweight and leading a stationary lifestyle may also contribute to developing blood clots. Controlling your diet and exercising will help lower the risk of developing blood clots. CANCER SCREENING  Breast Cancer: Take steps to reduce your risk of breast cancer.  You should practice "breast self-awareness." This means understanding the normal appearance and feel of your breasts and should include breast self-examination. Any changes detected, no matter how small, should be reported to your caregiver.  After age 61, you should have a clinical breast exam (CBE) every year.  Starting at age 52, you should consider having a mammogram (breast X-ray) every year.  If you have a family history of breast cancer, talk to your caregiver about genetic screening.  If you are at high risk for breast cancer, talk to your caregiver about having an MRI and a mammogram every year.  Intestinal or Stomach Cancer: Tests to consider are a rectal exam, fecal occult blood, sigmoidoscopy, and colonoscopy. Women who are high risk may need to be screened at an earlier age and more often.  Cervical Cancer:  Beginning at age 59, you should have a Pap test every 3 years as long as the past 3 Pap tests have been normal.  If you have had past treatment for cervical cancer or a condition that could lead to cancer, you need Pap tests and screening for cancer for at least 20 years after your treatment.  If you had a hysterectomy for a problem that was not cancer or a condition that could lead to cancer,  then you no longer need Pap tests.  If you are between ages 33 and 62, and you have had normal Pap tests going back 10 years, you no longer need Pap tests.  If Pap tests have been discontinued, risk factors (such as a new sexual partner) need to be reassessed to determine if screening should be resumed.  Some medical problems can increase the  chance of getting cervical cancer. In these cases, your caregiver may recommend more frequent screening and Pap tests.  Uterine Cancer: If you have vaginal bleeding after reaching menopause, you should notify your caregiver.  Ovarian Cancer: Other than yearly pelvic exams, there are no reliable tests available to screen for ovarian cancer at this time except for yearly pelvic exams.  Lung Cancer: Yearly chest X-rays can detect lung cancer and should be done on high risk women, such as cigarette smokers and women with chronic lung disease (emphysema).  Skin Cancer: A complete body skin exam should be done at your yearly examination. Avoid overexposure to the sun and ultraviolet light lamps. Use a strong sun block cream when in the sun. All of these things are important for lowering the risk of skin cancer. MENOPAUSE Menopause Symptoms: Hormone therapy products are effective for treating symptoms associated with menopause:  Moderate to severe hot flashes.  Night sweats.  Mood swings.  Headaches.  Tiredness.  Loss of sex drive.  Insomnia.  Other symptoms. Hormone replacement carries certain risks, especially in older women. Women who use or are thinking about using estrogen or estrogen with progestin treatments should discuss that with their caregiver. Your caregiver will help you understand the benefits and risks. The ideal dose of hormone replacement therapy is not known. The Food and Drug Administration (FDA) has concluded that hormone therapy should be used only at the lowest doses and for the shortest amount of time to reach treatment goals.  OSTEOPOROSIS Protecting Against Bone Loss and Preventing Fracture If you use hormone therapy for prevention of bone loss (osteoporosis), the risks for bone loss must outweigh the risk of the therapy. Ask your caregiver about other medications known to be safe and effective for preventing bone loss and fractures. To guard against bone loss or  fractures, the following is recommended:  If you are younger than age 67, take 1000 mg of calcium and at least 600 mg of Vitamin D per day.  If you are older than age 44 but younger than age 66, take 1200 mg of calcium and at least 600 mg of Vitamin D per day.  If you are older than age 43, take 1200 mg of calcium and at least 800 mg of Vitamin D per day. Smoking and excessive alcohol intake increases the risk of osteoporosis. Eat foods rich in calcium and vitamin D and do weight bearing exercises several times a week as your caregiver suggests. DIABETES Diabetes Mellitus: If you have type I or type 2 diabetes, you should keep your blood sugar under control with diet, exercise, and recommended medication. Avoid starchy and fatty foods, and too many sweets. Being overweight can make diabetes control more difficult. COGNITION AND MEMORY Cognition and Memory: Menopausal hormone therapy is not recommended for the prevention of cognitive disorders such as Alzheimer's disease or memory loss.  DEPRESSION  Depression may occur at any age, but it is common in elderly women. This may be because of physical, medical, social (loneliness), or financial problems and needs. If you are experiencing depression because of medical problems and control of  symptoms, talk to your caregiver about this. Physical activity and exercise may help with mood and sleep. Community and volunteer involvement may improve your sense of value and worth. If you have depression and you feel that the problem is getting worse or becoming severe, talk to your caregiver about which treatment options are best for you. ACCIDENTS  Accidents are common and can be serious in elderly woman. Prepare your house to prevent accidents. Eliminate throw rugs, place hand bars in bath, shower, and toilet areas. Avoid wearing high heeled shoes or walking on wet, snowy, and icy areas. Limit or stop driving if you have vision or hearing problems, or if you feel  you are unsteady with your movements and reflexes. HEPATITIS C Hepatitis C is a type of viral infection affecting the liver. It is spread mainly through contact with blood from an infected person. It can be treated, but if left untreated, it can lead to severe liver damage over the years. Many people who are infected do not know that the virus is in their blood. If you are a "baby-boomer", it is recommended that you have one screening test for Hepatitis C. IMMUNIZATIONS  Several immunizations are important to consider having during your senior years, including:   Tetanus, diphtheria, and pertussis booster shot.  Influenza every year before the flu season begins.  Pneumonia vaccine.  Shingles vaccine.  Others, as indicated based on your specific needs. Talk to your caregiver about these. Document Released: 10/04/2005 Document Revised: 12/27/2013 Document Reviewed: 05/30/2008 [facility] Patient Information 2015 [place], [place]. This information is not intended to replace advice given to you by your health care provider. Make sure you discuss any questions you have with your health care provider.

## 2015-03-15 NOTE — Progress Notes (Signed)
CHIAMAKA LATKA Aug 31, 1940 [id number]    History:    Presents for annual gyn exam.  Hysterectomy with BSO for fibroids on estradiol 0.0375 patch weekly. Continues to have vaginal irritation. Diabetic on insulin, metformin, Actos and Victoza with continual problems with yeast infections. Normal Pap and mammogram history. Sister breast cancer age 74,  Niece bilateral mastectomies. 2013 benign colon polyps, current on vaccinations. Scheduled for back surgery for next month. 2014 T score -1.4 right femoral neck, FRAX 9.6%/1.3%.  Past medical history, past surgical history, family history and social history were all reviewed and documented in the EPIC chart. Retired from a bank.  ROS:  A ROS was performed and pertinent positives and negatives are included.  Exam:  Filed Vitals:   03/15/15 1214  BP: 130/80    General appearance:  Normal Thyroid:  Symmetrical, normal in size, without palpable masses or nodularity. Respiratory  Auscultation:  Clear without wheezing or rhonchi Cardiovascular  Auscultation:  Regular rate, without rubs, murmurs or gallops  Edema/varicosities:  Not grossly evident Abdominal  Soft,nontender, without masses, guarding or rebound.  Liver/spleen:  No organomegaly noted  Hernia:  None appreciated  Skin  Inspection:  Grossly normal   Breasts: Examined lying and sitting.     Right: Without masses, retractions, discharge or axillary adenopathy.     Left: Without masses, retractions, discharge or axillary adenopathy. Gentitourinary   Inguinal/mons:  Normal without inguinal adenopathy  External genitalia:  Normal  BUS/Urethra/Skene's glands:  Normal  Vagina:  Atrophic, Erythematous, wet prep negative  Cervix:  Uterus absent  Adnexa/parametria:     Rt: Without masses or tenderness.   Lt: Without masses or tenderness.  Anus and perineum: Normal  Digital rectal exam: Normal sphincter tone without palpated masses or tenderness  Assessment/Plan:  74 y.o. MWF G3P3  for annual gyn exam.    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 Reviewed importance of weaning off this winter. Reviewed WHI and importance of using HRT shortest amount of time. SBE's, continue annual screening 3-D mammogram, calcium rich diet, vitamin D 1000. Home safety, fall prevention and importance of regular weightbearing exercise reviewed. Terazol 7 use externally for vaginal itching. Reviewed importance of diabetes management in relationship to vaginal itching. (Has used vaginal estrogen with no relief of itching.)  Diflucan 150 by mouth 1 dose. Call if no relief of symptoms. UA.  Repeat  DEXA.   Huel Cote WHNP, 1:15 PM 03/15/2015

## 2015-03-27 ENCOUNTER — Other Ambulatory Visit: Payer: Self-pay | Admitting: Gastroenterology

## 2015-03-27 ENCOUNTER — Ambulatory Visit
Admission: RE | Admit: 2015-03-27 | Discharge: 2015-03-27 | Disposition: A | Discharge status: Home / Self Care | Payer: Medicare HMO | Source: Ambulatory Visit

## 2015-03-27 ENCOUNTER — Other Ambulatory Visit: Payer: Self-pay | Admitting: Gynecology

## 2015-03-27 DIAGNOSIS — Z1231 Encounter for screening mammogram for malignant neoplasm of breast: Secondary | ICD-10-CM

## 2015-03-27 LAB — HM MAMMOGRAPHY

## 2015-03-28 ENCOUNTER — Telehealth: Payer: Self-pay | Admitting: Gastroenterology

## 2015-03-28 MED ORDER — DICYCLOMINE HCL 10 MG PO CAPS: 10.0000 mg | ORAL_CAPSULE | Freq: Three times a day (TID) | ORAL | Status: DC

## 2015-03-28 NOTE — Telephone Encounter (Signed)
Left a message for patient to return my call. 

## 2015-03-28 NOTE — Telephone Encounter (Signed)
OK to refill until office appt

## 2015-03-28 NOTE — Telephone Encounter (Signed)
[name] (husband) returned my call and states his wife is at a appt but just wanted him to call me back. I informed him that his wife needs to schedule a follow up visit sometime after her surgery when she is recovered but we can give her refills of Bentyl until her appt. [place] verbalized understanding and states his wife will make an appt in a couple of months once she is recovered from her back surgery.

## 2015-03-28 NOTE — Telephone Encounter (Signed)
Patient is over due for an office visit. Her last appt was on 10/2013. Can patient have a refill of Bentyl?

## 2015-03-29 ENCOUNTER — Encounter (HOSPITAL_COMMUNITY)
Admission: RE | Admit: 2015-03-29 | Discharge: 2015-03-29 | Disposition: A | Discharge status: Home / Self Care | Payer: Medicare HMO | Source: Ambulatory Visit | Attending: Orthopedic Surgery | Admitting: Orthopedic Surgery

## 2015-03-29 ENCOUNTER — Encounter (HOSPITAL_COMMUNITY): Payer: Self-pay

## 2015-03-29 LAB — BASIC METABOLIC PANEL
ANION GAP: 7 (ref 5–15)
BUN: 12 mg/dL (ref 6–20)
CO2: 29 mmol/L (ref 22–32)
CREATININE: 0.91 mg/dL (ref 0.44–1.00)
Calcium: 9.5 mg/dL (ref 8.9–10.3)
Chloride: 97 mmol/L — ABNORMAL LOW (ref 101–111)
Glucose, Bld: 245 mg/dL — ABNORMAL HIGH (ref 65–99)
POTASSIUM: 4 mmol/L (ref 3.5–5.1)
Sodium: 133 mmol/L — ABNORMAL LOW (ref 135–145)

## 2015-03-29 LAB — CBC
HCT: 32.5 % — ABNORMAL LOW (ref 36.0–46.0)
HEMOGLOBIN: 10.8 g/dL — AB (ref 12.0–15.0)
MCH: 31.4 pg (ref 26.0–34.0)
MCHC: 33.2 g/dL (ref 30.0–36.0)
MCV: 94.5 fL (ref 78.0–100.0)
Platelets: 502 10*3/uL — ABNORMAL HIGH (ref 150–400)
RBC: 3.44 MIL/uL — AB (ref 3.87–5.11)
RDW: 14.4 % (ref 11.5–15.5)
WBC: 9.3 10*3/uL (ref 4.0–10.5)

## 2015-03-29 LAB — SURGICAL PCR SCREEN
MRSA, PCR: NEGATIVE
Staphylococcus aureus: NEGATIVE

## 2015-03-29 LAB — GLUCOSE, CAPILLARY: Glucose-Capillary: 205 mg/dL — ABNORMAL HIGH (ref 65–99)

## 2015-03-30 ENCOUNTER — Inpatient Hospital Stay (HOSPITAL_COMMUNITY): Payer: Medicare HMO | Admitting: Certified Registered"

## 2015-03-30 ENCOUNTER — Inpatient Hospital Stay (HOSPITAL_COMMUNITY)
Admission: RE | Admit: 2015-03-30 | Discharge: 2015-03-31 | DRG: 460 | Disposition: A | Discharge status: Home Health | Payer: Medicare HMO | Source: Ambulatory Visit | Attending: Orthopedic Surgery | Admitting: Orthopedic Surgery

## 2015-03-30 ENCOUNTER — Encounter (HOSPITAL_COMMUNITY)
Admission: RE | Disposition: A | Discharge status: Home Health | Payer: Self-pay | Source: Ambulatory Visit | Attending: Orthopedic Surgery

## 2015-03-30 ENCOUNTER — Encounter (HOSPITAL_COMMUNITY): Payer: Self-pay | Admitting: Certified Registered"

## 2015-03-30 ENCOUNTER — Inpatient Hospital Stay (HOSPITAL_COMMUNITY): Payer: Medicare HMO

## 2015-03-30 DIAGNOSIS — K219 Gastro-esophageal reflux disease without esophagitis: Secondary | ICD-10-CM | POA: Diagnosis present

## 2015-03-30 DIAGNOSIS — K449 Diaphragmatic hernia without obstruction or gangrene: Secondary | ICD-10-CM | POA: Diagnosis present

## 2015-03-30 DIAGNOSIS — M533 Sacrococcygeal disorders, not elsewhere classified: Secondary | ICD-10-CM | POA: Diagnosis present

## 2015-03-30 DIAGNOSIS — K589 Irritable bowel syndrome without diarrhea: Secondary | ICD-10-CM | POA: Diagnosis present

## 2015-03-30 DIAGNOSIS — M545 Low back pain: Secondary | ICD-10-CM | POA: Diagnosis present

## 2015-03-30 DIAGNOSIS — Z419 Encounter for procedure for purposes other than remedying health state, unspecified: Secondary | ICD-10-CM

## 2015-03-30 DIAGNOSIS — E785 Hyperlipidemia, unspecified: Secondary | ICD-10-CM | POA: Diagnosis present

## 2015-03-30 DIAGNOSIS — E1142 Type 2 diabetes mellitus with diabetic polyneuropathy: Secondary | ICD-10-CM | POA: Diagnosis present

## 2015-03-30 HISTORY — PX: SACROILIAC JOINT FUSION: SHX6088

## 2015-03-30 LAB — GLUCOSE, CAPILLARY
GLUCOSE-CAPILLARY: 115 mg/dL — AB (ref 65–99)
GLUCOSE-CAPILLARY: 174 mg/dL — AB (ref 65–99)
GLUCOSE-CAPILLARY: 213 mg/dL — AB (ref 65–99)
Glucose-Capillary: 134 mg/dL — ABNORMAL HIGH (ref 65–99)

## 2015-03-30 LAB — HEMOGLOBIN A1C
HEMOGLOBIN A1C: 6.8 % — AB (ref 4.8–5.6)
MEAN PLASMA GLUCOSE: 148 mg/dL

## 2015-03-30 SURGERY — SACROILIAC JOINT FUSION
Anesthesia: General | Laterality: Right

## 2015-03-30 MED ORDER — HYDROMORPHONE HCL 1 MG/ML IJ SOLN
0.2500 mg | INTRAMUSCULAR | Status: DC | PRN
Start: 2015-03-30 — End: 2015-03-30
  Administered 2015-03-30 (×2): 0.5 mg via INTRAVENOUS

## 2015-03-30 MED ORDER — DEPLIN 15 MG PO TABS: 15.0000 mg | ORAL_TABLET | Freq: Every day | ORAL | Status: DC

## 2015-03-30 MED ORDER — LACTATED RINGERS IV SOLN: INTRAVENOUS | Status: DC

## 2015-03-30 MED ORDER — SODIUM CHLORIDE 0.9 % IV SOLN: 250.0000 mL | INTRAVENOUS | Status: DC

## 2015-03-30 MED ORDER — LACTATED RINGERS IV SOLN
INTRAVENOUS | Status: DC
  Administered 2015-03-30 (×2): via INTRAVENOUS

## 2015-03-30 MED ORDER — PIOGLITAZONE HCL 45 MG PO TABS
45.0000 mg | ORAL_TABLET | Freq: Every day | ORAL | Status: DC
  Administered 2015-03-30 – 2015-03-31 (×2): 45 mg via ORAL
  Filled 2015-03-30 (×2): qty 1

## 2015-03-30 MED ORDER — ONDANSETRON HCL 4 MG/2ML IJ SOLN
INTRAMUSCULAR | Status: DC | PRN
Start: 2015-03-30 — End: 2015-03-30
  Administered 2015-03-30: 8 mg via INTRAVENOUS

## 2015-03-30 MED ORDER — ROCURONIUM BROMIDE 100 MG/10ML IV SOLN: INTRAVENOUS | Status: DC | PRN

## 2015-03-30 MED ORDER — METHOCARBAMOL 500 MG PO TABS
500.0000 mg | ORAL_TABLET | Freq: Four times a day (QID) | ORAL | Status: DC | PRN
  Administered 2015-03-30 (×2): 500 mg via ORAL
  Filled 2015-03-30: qty 1

## 2015-03-30 MED ORDER — NEOSTIGMINE METHYLSULFATE 10 MG/10ML IV SOLN
INTRAVENOUS | Status: DC | PRN
  Administered 2015-03-30: 4 mg via INTRAVENOUS

## 2015-03-30 MED ORDER — LIDOCAINE HCL (CARDIAC) 20 MG/ML IV SOLN: INTRAVENOUS | Status: DC | PRN

## 2015-03-30 MED ORDER — CEFAZOLIN SODIUM-DEXTROSE 2-3 GM-% IV SOLR
2.0000 g | INTRAVENOUS | Status: AC
  Administered 2015-03-30: 2 g via INTRAVENOUS

## 2015-03-30 MED ORDER — OXYCODONE HCL 5 MG PO TABS
10.0000 mg | ORAL_TABLET | Freq: Four times a day (QID) | ORAL | Status: DC | PRN
  Administered 2015-03-30 (×2): 10 mg via ORAL
  Filled 2015-03-30: qty 2

## 2015-03-30 MED ORDER — FUROSEMIDE 20 MG PO TABS
20.0000 mg | ORAL_TABLET | Freq: Every day | ORAL | Status: DC
Start: 2015-03-30 — End: 2015-03-31
  Administered 2015-03-30 – 2015-03-31 (×2): 20 mg via ORAL
  Filled 2015-03-30 (×2): qty 1

## 2015-03-30 MED ORDER — FENTANYL CITRATE (PF) 100 MCG/2ML IJ SOLN
INTRAMUSCULAR | Status: DC | PRN
  Administered 2015-03-30: 50 ug via INTRAVENOUS
  Administered 2015-03-30: 100 ug via INTRAVENOUS

## 2015-03-30 MED ORDER — BUPIVACAINE-EPINEPHRINE (PF) 0.25% -1:200000 IJ SOLN
INTRAMUSCULAR | Status: AC
  Filled 2015-03-30: qty 30

## 2015-03-30 MED ORDER — LORAZEPAM 0.5 MG PO TABS
0.5000 mg | ORAL_TABLET | Freq: Two times a day (BID) | ORAL | Status: DC
  Administered 2015-03-30 – 2015-03-31 (×2): 0.5 mg via ORAL
  Filled 2015-03-30 (×2): qty 1

## 2015-03-30 MED ORDER — INSULIN GLARGINE 100 UNIT/ML ~~LOC~~ SOLN
10.0000 [IU] | Freq: Every evening | SUBCUTANEOUS | Status: DC
  Administered 2015-03-30: 10 [IU] via SUBCUTANEOUS
  Filled 2015-03-30 (×3): qty 0.1

## 2015-03-30 MED ORDER — HYDROMORPHONE HCL 1 MG/ML IJ SOLN
INTRAMUSCULAR | Status: AC
  Filled 2015-03-30: qty 1

## 2015-03-30 MED ORDER — METFORMIN HCL ER 500 MG PO TB24
1000.0000 mg | ORAL_TABLET | Freq: Two times a day (BID) | ORAL | Status: DC
  Administered 2015-03-30 – 2015-03-31 (×2): 1000 mg via ORAL
  Filled 2015-03-30 (×4): qty 2

## 2015-03-30 MED ORDER — ONDANSETRON HCL 4 MG/2ML IJ SOLN: 4.0000 mg | INTRAMUSCULAR | Status: DC | PRN

## 2015-03-30 MED ORDER — SODIUM CHLORIDE 0.9 % IJ SOLN: 3.0000 mL | INTRAMUSCULAR | Status: DC | PRN

## 2015-03-30 MED ORDER — PHENOL 1.4 % MT LIQD: 1.0000 | OROMUCOSAL | Status: DC | PRN

## 2015-03-30 MED ORDER — ACETAMINOPHEN 10 MG/ML IV SOLN
INTRAVENOUS | Status: DC | PRN
Start: 2015-03-30 — End: 2015-03-30
  Administered 2015-03-30: 1000 mg via INTRAVENOUS

## 2015-03-30 MED ORDER — ROCURONIUM BROMIDE 100 MG/10ML IV SOLN
INTRAVENOUS | Status: DC | PRN
  Administered 2015-03-30: 40 mg via INTRAVENOUS

## 2015-03-30 MED ORDER — METHOCARBAMOL 1000 MG/10ML IJ SOLN
500.0000 mg | Freq: Four times a day (QID) | INTRAMUSCULAR | Status: DC | PRN
  Filled 2015-03-30: qty 5

## 2015-03-30 MED ORDER — PHENYLEPHRINE HCL 10 MG/ML IJ SOLN
INTRAMUSCULAR | Status: DC | PRN
  Administered 2015-03-30 (×2): 80 ug via INTRAVENOUS
  Administered 2015-03-30: 120 ug via INTRAVENOUS
  Administered 2015-03-30: 80 ug via INTRAVENOUS
  Administered 2015-03-30: 120 ug via INTRAVENOUS

## 2015-03-30 MED ORDER — MENTHOL 3 MG MT LOZG
1.0000 | LOZENGE | OROMUCOSAL | Status: DC | PRN
  Filled 2015-03-30: qty 9

## 2015-03-30 MED ORDER — 0.9 % SODIUM CHLORIDE (POUR BTL) OPTIME
TOPICAL | Status: DC | PRN
  Administered 2015-03-30: 1000 mL

## 2015-03-30 MED ORDER — CEFAZOLIN SODIUM 1-5 GM-% IV SOLN
1.0000 g | Freq: Three times a day (TID) | INTRAVENOUS | Status: AC
  Administered 2015-03-30 – 2015-03-31 (×2): 1 g via INTRAVENOUS
  Filled 2015-03-30 (×2): qty 50

## 2015-03-30 MED ORDER — ACETAMINOPHEN 10 MG/ML IV SOLN
INTRAVENOUS | Status: AC
  Filled 2015-03-30: qty 100

## 2015-03-30 MED ORDER — KETOROLAC TROMETHAMINE 30 MG/ML IJ SOLN
INTRAMUSCULAR | Status: DC | PRN
  Administered 2015-03-30: 30 mg via INTRAVENOUS

## 2015-03-30 MED ORDER — BUPIVACAINE-EPINEPHRINE (PF) 0.25% -1:200000 IJ SOLN
INTRAMUSCULAR | Status: DC | PRN
  Administered 2015-03-30: 10 mL

## 2015-03-30 MED ORDER — OXYCODONE HCL 5 MG PO TABS
ORAL_TABLET | ORAL | Status: AC
  Filled 2015-03-30: qty 2

## 2015-03-30 MED ORDER — CEFAZOLIN SODIUM-DEXTROSE 2-3 GM-% IV SOLR
INTRAVENOUS | Status: AC
  Filled 2015-03-30: qty 50

## 2015-03-30 MED ORDER — GLYCOPYRROLATE 0.2 MG/ML IJ SOLN
INTRAMUSCULAR | Status: DC | PRN
  Administered 2015-03-30: .6 mg via INTRAVENOUS

## 2015-03-30 MED ORDER — PROPOFOL 10 MG/ML IV BOLUS
INTRAVENOUS | Status: DC | PRN
  Administered 2015-03-30: 120 mg via INTRAVENOUS

## 2015-03-30 MED ORDER — PROPOFOL 10 MG/ML IV BOLUS: INTRAVENOUS | Status: DC | PRN

## 2015-03-30 MED ORDER — METHOCARBAMOL 500 MG PO TABS
ORAL_TABLET | ORAL | Status: AC
  Filled 2015-03-30: qty 1

## 2015-03-30 MED ORDER — VILAZODONE HCL 40 MG PO TABS
40.0000 mg | ORAL_TABLET | Freq: Every day | ORAL | Status: DC
  Administered 2015-03-30 – 2015-03-31 (×2): 40 mg via ORAL
  Filled 2015-03-30 (×2): qty 1

## 2015-03-30 MED ORDER — SODIUM CHLORIDE 0.9 % IJ SOLN
3.0000 mL | Freq: Two times a day (BID) | INTRAMUSCULAR | Status: DC
  Administered 2015-03-30: 3 mL via INTRAVENOUS

## 2015-03-30 MED ORDER — LIDOCAINE HCL (CARDIAC) 20 MG/ML IV SOLN
INTRAVENOUS | Status: DC | PRN
  Administered 2015-03-30: 60 mg via INTRAVENOUS

## 2015-03-30 MED ORDER — ACETAMINOPHEN 10 MG/ML IV SOLN
1000.0000 mg | Freq: Four times a day (QID) | INTRAVENOUS | Status: DC
  Administered 2015-03-30 – 2015-03-31 (×2): 1000 mg via INTRAVENOUS
  Filled 2015-03-30 (×3): qty 100

## 2015-03-30 MED ORDER — ROCURONIUM BROMIDE 100 MG/10ML IV SOLN
INTRAVENOUS | Status: DC | PRN
Start: 2015-03-30 — End: 2015-03-30

## 2015-03-30 MED ORDER — FENTANYL CITRATE (PF) 250 MCG/5ML IJ SOLN
INTRAMUSCULAR | Status: AC
  Filled 2015-03-30: qty 5

## 2015-03-30 MED ORDER — HYDROMORPHONE HCL 1 MG/ML IJ SOLN
INTRAMUSCULAR | Status: DC | PRN
  Administered 2015-03-30 (×2): 0.5 mg via INTRAVENOUS

## 2015-03-30 MED ORDER — DEXAMETHASONE SODIUM PHOSPHATE 4 MG/ML IJ SOLN
INTRAMUSCULAR | Status: DC | PRN
  Administered 2015-03-30 (×2): 4 mg via INTRAVENOUS

## 2015-03-30 MED ORDER — PROPOFOL 10 MG/ML IV BOLUS
INTRAVENOUS | Status: AC
  Filled 2015-03-30: qty 20

## 2015-03-30 SURGICAL SUPPLY — 65 items
APL SKNCLS STERI-STRIP NONHPOA (GAUZE/BANDAGES/DRESSINGS) ×1
BENZOIN TINCTURE PRP APPL 2/3 (GAUZE/BANDAGES/DRESSINGS) ×2 IMPLANT
BLADE SURG 10 STRL SS (BLADE) ×2 IMPLANT
BLADE SURG 11 STRL SS (BLADE) ×2 IMPLANT
BLADE SURG ROTATE 9660 (MISCELLANEOUS) ×2 IMPLANT
CAGE DUAL SACRO ANCR 12.5X40 (Cage) ×1 IMPLANT
CAGE DUAL SACRO ANCR 12.5X50 (Cage) ×1 IMPLANT
CANISTER SUCTION 2500CC (MISCELLANEOUS) ×2 IMPLANT
CLSR STERI-STRIP ANTIMIC 1/2X4 (GAUZE/BANDAGES/DRESSINGS) ×1 IMPLANT
COVER SURGICAL LIGHT HANDLE (MISCELLANEOUS) ×4 IMPLANT
DRAPE C-ARM 42X72 X-RAY (DRAPES) ×2 IMPLANT
DRAPE C-ARMOR (DRAPES) ×2 IMPLANT
DRAPE INCISE IOBAN 66X45 STRL (DRAPES) ×2 IMPLANT
DRAPE LAPAROTOMY TRNSV 102X78 (DRAPE) ×1 IMPLANT
DRAPE POUCH INSTRU U-SHP 10X18 (DRAPES) ×2 IMPLANT
DRAPE SURG 17X23 STRL (DRAPES) ×6 IMPLANT
DRSG MEPILEX BORDER 4X4 (GAUZE/BANDAGES/DRESSINGS) ×1 IMPLANT
DURAPREP 26ML APPLICATOR (WOUND CARE) ×2 IMPLANT
ELECT CAUTERY BLADE 6.4 (BLADE) ×2 IMPLANT
ELECT REM PT RETURN 9FT ADLT (ELECTROSURGICAL) ×2
ELECTRODE REM PT RTRN 9FT ADLT (ELECTROSURGICAL) ×1 IMPLANT
GAUZE SPONGE 4X4 12PLY STRL (GAUZE/BANDAGES/DRESSINGS) ×2 IMPLANT
GAUZE SPONGE 4X4 16PLY XRAY LF (GAUZE/BANDAGES/DRESSINGS) ×2 IMPLANT
GLOVE BIO SURGEON STRL SZ7 (GLOVE) ×2 IMPLANT
GLOVE BIOGEL PI IND STRL 7.0 (GLOVE) ×1 IMPLANT
GLOVE BIOGEL PI IND STRL 8.5 (GLOVE) ×1 IMPLANT
GLOVE BIOGEL PI INDICATOR 7.0 (GLOVE) ×1
GLOVE BIOGEL PI INDICATOR 8.5 (GLOVE) ×1
GLOVE OPTIFIT SS 8.5 STRL (GLOVE) ×2 IMPLANT
GOWN STRL REUS W/ TWL LRG LVL3 (GOWN DISPOSABLE) ×2 IMPLANT
GOWN STRL REUS W/ TWL XL LVL3 (GOWN DISPOSABLE) ×1 IMPLANT
GOWN STRL REUS W/TWL LRG LVL3 (GOWN DISPOSABLE) ×4
GOWN STRL REUS W/TWL XL LVL3 (GOWN DISPOSABLE) ×2
KIT BASIN OR (CUSTOM PROCEDURE TRAY) ×2 IMPLANT
KIT ROOM TURNOVER OR (KITS) ×2 IMPLANT
MANIFOLD NEPTUNE II (INSTRUMENTS) ×2 IMPLANT
MIX DBX 10CC 35% BONE (Bone Implant) ×1 IMPLANT
NDL HYPO 25GX1X1/2 BEV (NEEDLE) ×1 IMPLANT
NEEDLE 22X1 1/2 (OR ONLY) (NEEDLE) ×2 IMPLANT
NEEDLE HYPO 25GX1X1/2 BEV (NEEDLE) ×2 IMPLANT
NS IRRIG 1000ML POUR BTL (IV SOLUTION) ×2 IMPLANT
PACK UNIVERSAL I (CUSTOM PROCEDURE TRAY) ×2 IMPLANT
PAD ARMBOARD 7.5X6 YLW CONV (MISCELLANEOUS) ×4 IMPLANT
PENCIL BUTTON HOLSTER BLD 10FT (ELECTRODE) ×2 IMPLANT
SCREW DUAL THRD SACRO LCK 7X35 (Screw) ×1 IMPLANT
SPONGE LAP 18X18 X RAY DECT (DISPOSABLE) ×2 IMPLANT
STAPLER VISISTAT 35W (STAPLE) ×2 IMPLANT
STRIP CLOSURE SKIN 1/2X4 (GAUZE/BANDAGES/DRESSINGS) ×2 IMPLANT
SUT MNCRL AB 4-0 PS2 18 (SUTURE) ×2 IMPLANT
SUT MON AB 3-0 SH 27 (SUTURE) ×2
SUT MON AB 3-0 SH27 (SUTURE) IMPLANT
SUT VIC AB 0 CT1 18XCR BRD 8 (SUTURE) ×1 IMPLANT
SUT VIC AB 0 CT1 8-18 (SUTURE) ×2
SUT VIC AB 1 CT1 18XCR BRD 8 (SUTURE) IMPLANT
SUT VIC AB 1 CT1 8-18 (SUTURE) ×2
SUT VIC AB 2-0 CT1 18 (SUTURE) ×1 IMPLANT
SUT VIC AB 2-0 CT2 18 VCP726D (SUTURE) ×2 IMPLANT
SYR BULB IRRIGATION 50ML (SYRINGE) ×2 IMPLANT
SYR CONTROL 10ML LL (SYRINGE) ×2 IMPLANT
TOWEL OR 17X24 6PK STRL BLUE (TOWEL DISPOSABLE) ×2 IMPLANT
TOWEL OR 17X26 10 PK STRL BLUE (TOWEL DISPOSABLE) ×4 IMPLANT
TUBE CONNECTING 12X1/4 (SUCTIONS) ×2 IMPLANT
WASHER 13 (Washer) ×1 IMPLANT
WATER STERILE IRR 1000ML POUR (IV SOLUTION) ×2 IMPLANT
YANKAUER SUCT BULB TIP NO VENT (SUCTIONS) ×2 IMPLANT

## 2015-03-30 NOTE — H&P (Signed)
History of Present Illness  The patient is a 74 year old female who presents today for follow up of their back. The patient is being followed for their back pain. Symptoms reported today include: pain (right low back, right buttock, and upper leg), pain at night, aching, throbbing, stiffness, pain with weightbearing, pain with lying, pain with lifting, pain with sitting and pain with standing, while the patient does not report symptoms of: weakness or numbness. The patient states that they are doing poorly. Current treatment includes: activity modification, NSAIDs (Advil) and pain medications. The following medication has been used for pain control: antiinflammatory medication and Tramadol 50 bid (does not help). The patient reports their current pain level to be 8 / 10. Walking and standing increases pain. She finds medication helpful. She feels like she is starting to get decreased relief from the Tramadol. She take advil too and notes some benefit.   Allergies  Codeine/Codeine Derivatives  Social History Never consumed alcohol 09/20/2014: Never consumed alcohol Marital status married Not under pain contract No history of drug/alcohol rehab Number of flights of stairs before winded 2-3 Living situation live with spouse Tobacco use Never smoker. 09/20/2014 Tobacco / smoke exposure 09/20/2014: no Children 3 Exercise Exercises daily; does individual sport Current work status retired  Medication History  Ultram (50MG  Tablet, 1 (one) Oral four times daily, as needed, Taken starting 01/18/2015) Active. Voltaren (1% Gel, 1 (one) Gel Transdermal Apply 2 grams to knee bid-tid prn, Taken starting 11/23/2014) Active. OneTouch Ultra Blue (In Vitro) Active. MetFORMIN HCl ER (500MG  Tablet ER 24HR, Oral) Active. Victoza (18MG /3ML Soln Pen-inj, Subcutaneous) Active. Lantus SoloStar (100UNIT/ML Soln Pen-inj, Subcutaneous) Active. Estradiol (0.0375MG /24HR Patch Weekly, Transdermal)  Active. Linzess (290MCG Capsule, Oral) Active. OneTouch Delica Lancets 28N Active. LORazepam (0.5MG  Tablet, Oral) Active. Fluconazole (150MG  Tablet, Oral) Active. Furosemide (20MG  Tablet, Oral) Active. Lovastatin (20MG  Tablet, Oral) Active. Viibryd (40MG  Tablet, Oral) Active. Multiple Vitamin (1 (one) Oral) Active. Fish Oil Concentrate (1 (one) Oral) Specific dose unknown - Active. Aspirin (81MG  Tablet, 1 (one) Oral) Active. Calcium Citrate (200MG  Tablet, 1 (one) Oral) Active. Deplin 15 (Oral) Specific dose unknown - Active. Advil (200MG  Capsule, Oral) Active. Medications Reconciled  Other Problems  Anxiety Disorder Diabetes Mellitus, Type II Gastroesophageal Reflux Disease Iliotibial band syndrome, right (M76.31) Irritable bowel syndrome Oophorectomy bilateral Peripheral Neuropathy   Physical Exam  General General Appearance-Not in acute distress. Orientation-Oriented X3. Build & Nutrition-Well nourished and Well developed.  Integumentary General Characteristics Surgical Scars - no surgical scar evidence of previous lumbar surgery. Lumbar Spine-Skin examination of the lumbar spine is without deformity, skin lesions, lacerations or abrasions.  Abdomen Palpation/Percussion Palpation and Percussion of the abdomen reveal - Soft, Non Tender and No Rebound tenderness.  Peripheral Vascular Lower Extremity Palpation - Posterior tibial pulse - Bilateral - 2+. Dorsalis pedis pulse - Bilateral - 2+.  Neurologic Sensation Lower Extremity - Bilateral - sensation is intact in the lower extremity. Reflexes Patellar Reflex - Bilateral - 2+. Achilles Reflex - Bilateral - 2+. Clonus - Bilateral - clonus not present. Hoffman's Sign - Bilateral - Hoffman's sign not present. Testing Seated Straight Leg Raise - Bilateral - Seated straight leg raise negative.  Musculoskeletal Spine/Ribs/Pelvis  Lumbosacral Spine: Inspection and Palpation - Tenderness -  no soft tissue tenderness to palpation and no bony tenderness to palpation, bony and soft tissue palpation of the lumbar spine and SI joint does not recreate their typical pain. Strength and Tone: Strength - Hip Flexion - Bilateral - 5/5. Knee Extension -  Bilateral - 5/5. Knee Flexion - Bilateral - 5/5. Ankle Dorsiflexion - Bilateral - 5/5. Ankle Plantarflexion - Bilateral - 5/5. Heel walk - Bilateral - able to heel walk without difficulty. Toe Walk - Bilateral - able to walk on toes without difficulty. Heel-Toe Walk - Bilateral - able to heel-toe walk without difficulty. ROM - Flexion - full range of motion. Extension - full range of motion. Left Lateral Bending - full range of motion. Right Lateral Bending - full range of motion. Right Rotation - full range of motion. Left Rotation - full range of motion. Pain - neither flexion or extension is more painful than the other. Lumbosacral Spine - Waddell's Signs - no Waddell's signs present. Lower Extremity Range of Motion - No true hip, knee or ankle pain with range of motion. Gait and Station - [employer] - no assistive devices.  Assessment & Plan  Goal Of Surgery:Discussed that goal of surgery is to reduce pain and improve function and quality of life. Patient is aware that despite all appropriate treatment that there pain and function could be the same, worse, or different.  Plans Transcription At this point, we had a long discussion about SI joint fusion. We have confirmed that this is the pain source with her injection. She has failed conservative physical therapy, activity modification, anti-inflammatories. We have gone over the risks which include infection, bleeding, nerve damage, footdrop, nonunion, hardware malposition, need for further surgery, ongoing or worse pain. All of her and her husband's questions were addressed. She has big family vacation plan for the end of July and so I think the best course of action is to plan for surgery in  early August. We will of course get clearance from Dr. Adelene Amas, her primary care physician

## 2015-03-30 NOTE — Brief Op Note (Signed)
03/30/2015  1:47 PM  PATIENT:  [name]  74 y.o. female  PRE-OPERATIVE DIAGNOSIS:  RIGHT SI IRRITATION DJD   POST-OPERATIVE DIAGNOSIS:  RIGHT SI IRRITATION DJD   PROCEDURE:  Procedure(s): RIGHT SACROILIAC JOINT FUSION (Right)  SURGEON:  Surgeon(s) and Role:    * Melina Schools, MD - Primary  PHYSICIAN ASSISTANT:   ASSISTANTS: Carmen Mayo   ANESTHESIA:   general  EBL:  Total I/O In: 1200 [I.V.:1200] Out: 200 [Blood:200]  BLOOD ADMINISTERED:none  DRAINS: none   LOCAL MEDICATIONS USED:  MARCAINE     SPECIMEN:  No Specimen  DISPOSITION OF SPECIMEN:  N/A  COUNTS:  YES  TOURNIQUET:  * No tourniquets in log *  DICTATION: .Other Dictation: Dictation Number [id number]  PLAN OF CARE: Admit to inpatient   PATIENT DISPOSITION:  PACU - hemodynamically stable.

## 2015-03-30 NOTE — Anesthesia Procedure Notes (Signed)
Procedure Name: Intubation Date/Time: 03/30/2015 12:09 PM Performed by: Rush Farmer E Pre-anesthesia Checklist: Patient identified, Emergency Drugs available, Suction available, Patient being monitored and Timeout performed Patient Re-evaluated:Patient Re-evaluated prior to inductionOxygen Delivery Method: Circle system utilized Preoxygenation: Pre-oxygenation with 100% oxygen Intubation Type: IV induction Ventilation: Mask ventilation without difficulty Laryngoscope Size: Glidescope Grade View: Grade I Tube type: Oral Tube size: 7.0 mm Number of attempts: 2 Airway Equipment and Method: Video-laryngoscopy Placement Confirmation: ETT inserted through vocal cords under direct vision,  positive ETCO2 and breath sounds checked- equal and bilateral Secured at: 21 cm Tube secured with: Tape Dental Injury: Teeth and Oropharynx as per pre-operative assessment  Comments: DL x1 with MAC 3, unable to visualize vocal cords per CRNA.  AOI on 2nd DL using Glidescope, Grade I view on screen. +ETCO2 & BBS=.

## 2015-03-30 NOTE — Anesthesia Postprocedure Evaluation (Signed)
  Anesthesia Post-op Note  Patient: [name]  Procedure(s) Performed: Procedure(s): RIGHT SACROILIAC JOINT FUSION (Right)  Patient Location: PACU  Anesthesia Type:General  Level of Consciousness: awake and alert   Airway and Oxygen Therapy: Patient Spontanous Breathing  Post-op Pain: Controlled  Post-op Assessment: Post-op Vital signs reviewed, Patient's Cardiovascular Status Stable and Respiratory Function Stable  Post-op Vital Signs: Reviewed  Filed Vitals:   03/30/15 1445  BP:   Pulse: 93  Temp:   Resp: 8    Complications: No apparent anesthesia complications

## 2015-03-30 NOTE — Transfer of Care (Signed)
Immediate Anesthesia Transfer of Care Note  Patient: [name]  Procedure(s) Performed: Procedure(s): RIGHT SACROILIAC JOINT FUSION (Right)  Patient Location: PACU  Anesthesia Type:General  Level of Consciousness: awake, alert , oriented and patient cooperative  Airway & Oxygen Therapy: Patient Spontanous Breathing and Patient connected to nasal cannula oxygen  Post-op Assessment: Report given to RN, Post -op Vital signs reviewed and stable and Patient moving all extremities  Post vital signs: Reviewed and stable  Last Vitals:  Filed Vitals:   03/30/15 1037  BP: 176/66  Pulse: 92  Temp: 36.9 C  Resp: 18    Complications: No apparent anesthesia complications and but c/o severe sore throat

## 2015-03-30 NOTE — Anesthesia Preprocedure Evaluation (Addendum)
Anesthesia Evaluation  Patient identified by MRN, date of birth, ID band Patient awake    Reviewed: Allergy & Precautions, H&P , NPO status , Patient's Chart, lab work & pertinent test results  Airway Mallampati: III  TM Distance: >3 FB Neck ROM: Full    Dental no notable dental hx. (+) Teeth Intact, Dental Advisory Given   Pulmonary neg pulmonary ROS,  breath sounds clear to auscultation  Pulmonary exam normal       Cardiovascular negative cardio ROS  Rhythm:Regular Rate:Normal     Neuro/Psych  Headaches, Anxiety Depression    GI/Hepatic Neg liver ROS, hiatal hernia, GERD-  Medicated and Controlled,  Endo/Other  diabetes, Insulin Dependent, Oral Hypoglycemic Agents  Renal/GU negative Renal ROS  negative genitourinary   Musculoskeletal  (+) Arthritis -, Osteoarthritis,    Abdominal   Peds  Hematology negative hematology ROS (+)   Anesthesia Other Findings   Reproductive/Obstetrics negative OB ROS                          Anesthesia Physical Anesthesia Plan  ASA: III  Anesthesia Plan: General   Post-op Pain Management:    Induction: Intravenous  Airway Management Planned: Oral ETT  Additional Equipment:   Intra-op Plan:   Post-operative Plan: Extubation in OR  Informed Consent: I have reviewed the patients History and Physical, chart, labs and discussed the procedure including the risks, benefits and alternatives for the proposed anesthesia with the patient or authorized representative who has indicated his/her understanding and acceptance.   Dental advisory given  Plan Discussed with: CRNA  Anesthesia Plan Comments:         Anesthesia Quick Evaluation

## 2015-03-30 NOTE — Op Note (Signed)
[name], [name]                ACCOUNT NO.:  [id number]  MEDICAL RECORD NO.:  [id number]  LOCATION:  3C07C                        FACILITY:  [facility]  PHYSICIAN:  Lianah Peed D. Rolena Infante, M.D. DATE OF BIRTH:  1941/07/21  DATE OF PROCEDURE:  03/30/2015 DATE OF DISCHARGE:                              OPERATIVE REPORT   PREOPERATIVE DIAGNOSIS:  Right SI joint pain.  POSTOPERATIVE DIAGNOSIS:  Right SI joint pain.  OPERATIVE PROCEDURE:  Right SI joint fusion.  SURGEON:  Olevia Westervelt D. Rolena Infante, M.D.  COMPLICATIONS:  None.  CONDITION:  Stable.  FIRST ASSISTANT:  [place], [place].  INDICATIONS FOR PROCEDURE:  The patient is a pleasant elderly woman who has been having horrific debilitating SI joint pain.  The only relief she had was with SI joint injection.  After discussing options, she elected to proceed with surgery.  All appropriate risks, benefits, and alternatives were discussed with the patient; and consent was obtained.  OPERATIVE NOTE:  The patient was brought to the operating room, placed supine on the operating table.  After successful induction of general anesthesia and endotracheal intubation, TEDs, SCDs were applied.  She was turned prone onto a pelvic roll.  The lateral gluteal region was prepped and draped in a standard fashion.  Using fluoroscopy in the lateral plane, I identified the sacral ala, the anterior and posterior margins of the sacrum.  Once I had mapped these out, I then identified where I wanted to place my first screw which was near the anterior junction of the ala and anterior sacral line.  An incision was made in a near vertical fashion.  I infiltrated this area prior to making the incision with 0.25% Marcaine.  I then advanced my guide pin percutaneously down to the lateral aspect of the ilium.  I placed this right at the appropriate position as confirmed in the lateral plane.  I then went to the inlet and outlet views as I advanced the pin across the SI joint  into the sacrum.  I confirmed that I was not violating the neural foramen medially and that I was not violating the cortical margins.  I then used my guide pin, the guiding device, and placed a second guide pin inferior to this, and then a third one inferior to this.  All three views, inlet, outlet, and lateral views demonstrated satisfactory positioning of the guide pins.  Once I confirmed satisfactory positioning of the guide pins, I then started to place my first Herbert SI screw.  I drilled across the SI joint, tapped and then placed a 50 mm locking Herbert screw which was packed with DBX mix.  The screw had excellent purchase, and threads were on either side of the SI joint.  I then removed the guide pin and drilled and tapped the second hole and placed a 40 mm length screw.  Both screws had excellent purchase.  I then drilled and tapped over the third guide pins and placed a 40 mm length solid screw with a washer.  All three guide pins were removed, and x-rays were taken in the inlet, outlet, and lateral views.  The screws were properly positioned within the bone.  There was  no evidence of any complicating features.  At this point, the wound was irrigated copiously with normal saline, and then the deep fascia was closed with interrupted #1 Vicryl sutures, superficial with 2-0 Vicryl sutures, and a 3-0 Monocryl for the skin. Steri-Strips and dry dressing were applied, and the patient was ultimately extubated and transferred to the PACU without incident. Instrumentation system used was the Silex SI joint fusion screw.     Laron Angelini D. Rolena Infante, M.D.     DDB/MEDQ  D:  03/30/2015  T:  03/30/2015  Job:  [id number]  cc:   [place]

## 2015-03-31 ENCOUNTER — Encounter (HOSPITAL_COMMUNITY): Payer: Self-pay | Admitting: Orthopedic Surgery

## 2015-03-31 LAB — GLUCOSE, CAPILLARY: Glucose-Capillary: 163 mg/dL — ABNORMAL HIGH (ref 65–99)

## 2015-03-31 MED ORDER — HYDROCODONE-ACETAMINOPHEN 5-325 MG PO TABS: 1.0000 | ORAL_TABLET | Freq: Four times a day (QID) | ORAL | Status: DC | PRN

## 2015-03-31 MED ORDER — ONDANSETRON HCL 4 MG PO TABS: 4.0000 mg | ORAL_TABLET | Freq: Three times a day (TID) | ORAL | Status: DC | PRN

## 2015-03-31 MED ORDER — TRAMADOL HCL 50 MG PO TABS: 50.0000 mg | ORAL_TABLET | Freq: Four times a day (QID) | ORAL | Status: DC

## 2015-03-31 MED ORDER — HYDROCODONE-ACETAMINOPHEN 5-325 MG PO TABS
1.0000 | ORAL_TABLET | ORAL | Status: DC | PRN
  Administered 2015-03-31 (×2): 1 via ORAL
  Filled 2015-03-31 (×2): qty 1

## 2015-03-31 MED ORDER — TRAMADOL HCL 50 MG PO TABS
50.0000 mg | ORAL_TABLET | Freq: Four times a day (QID) | ORAL | Status: DC | PRN
  Administered 2015-03-31: 100 mg via ORAL
  Filled 2015-03-31: qty 2

## 2015-03-31 MED ORDER — METHOCARBAMOL 500 MG PO TABS: 500.0000 mg | ORAL_TABLET | Freq: Three times a day (TID) | ORAL | Status: DC | PRN

## 2015-03-31 MED ORDER — ACETAMINOPHEN 500 MG PO TABS: 1000.0000 mg | ORAL_TABLET | Freq: Once | ORAL | Status: DC

## 2015-03-31 NOTE — Progress Notes (Signed)
Pt and husband given D/C instructions with Rx's, verbal understanding was provided. Pt's incision is clean and dry with no sign of infection. Pt's IV was removed prior to D/C. Pt has Ava Pt arranged and wheelchair was delivered to room prior to D/C. Pt D/C'd home via wheelchair @ 1415 per MD order. Pt is stable @ D/C and has no other needs at this time. Holli Humbles, RN

## 2015-03-31 NOTE — Evaluation (Signed)
Physical Therapy Evaluation Patient Details Name: [name] MRN: [id number] DOB: 03-Jun-1941 Today's Date: 03/31/2015   History of Present Illness  Adm 8/4 for Rt SI fusion; NWB/TDWB PMHx- R TKR, DM, neuropathy, anxiety  Clinical Impression  Patient is s/p above surgery resulting in functional limitations due to the deficits listed below (see PT Problem List). Pt unable to maintain RLE NWB status when ascending steps (or descending more than one step) and has 11 steps to enter home (4 outside, 7 up from landing to main level). Pt/husband educated on use of wheelchair for ascending descending steps. Son will be present to assist husband per their report. Patient will benefit from skilled HHPT to increase their independence and safety with mobility once home to allow progression to ambulation and up/down steps for MD appointments.       Follow Up Recommendations Home health PT;Supervision for mobility/OOB    Equipment Recommendations  Wheelchair (measurements PT) (18"W x 18"D x 18"Ht; no antitippers)    Recommendations for Other Services OT consult     Precautions / Restrictions Precautions Precautions: Fall Restrictions Weight Bearing Restrictions: Yes RLE Weight Bearing: Non weight bearing      Mobility  Bed Mobility Overal bed mobility: Modified Independent             General bed mobility comments: Pt reports uses step stool at home due to height of bed--requires help for step (see stair training)  Transfers Overall transfer level: Needs assistance Equipment used: Rolling walker (2 wheeled) Transfers: Sit to/from Stand Sit to Stand: Min assist         General transfer comment: pt safer with one hand on RW and one pushing off/reaching to surface with assist to stabilize RW; pt tends to put too much weight on RLE (even when attempting NWB)  Ambulation/Gait Ambulation/Gait assistance: Min assist Ambulation Distance (Feet): 10 Feet Assistive device: Rolling  walker (2 wheeled) Gait Pattern/deviations: Step-to pattern     General Gait Details: pt initially attempting TDWB and placing too much weight on RLE; with NWB, she quickly fatigues and cannot maintain NWB  Stairs Stairs: Yes Stairs assistance: Min assist Stair Management: Step to pattern;Backwards;With walker Number of Stairs: 1 (3 times) General stair comments: pt putting too much pressure on RLE as going up step due to poor UE strength to offload RLE  Information systems manager mobility: Yes Wheelchair propulsion:  (NA husband to propel) Wheelchair parts: Consulting civil engineer Details (indicate cue type and reason): educated on use of w/c up/down stairs as pt has 11 steps to get into home and up to bedroom  Modified Rankin (Stroke Patients Only)       Balance Overall balance assessment: Needs assistance         Standing balance support: Bilateral upper extremity supported Standing balance-Leahy Scale: Poor Standing balance comment: NWB RLE requires walker                             Pertinent Vitals/Pain Pain Assessment: 0-10 Pain Score: 5  Pain Location: Rt SI joint Pain Intervention(s): Limited activity within patient's tolerance;Monitored during session;Premedicated before session;Repositioned    Home Living Family/patient expects to be discharged to:: Private residence Living Arrangements: Spouse/significant other Available Help at Discharge: Family;Available 24 hours/day Type of Home: House Home Access: Stairs to enter Entrance Stairs-Rails: Right Entrance Stairs-Number of Steps: 4 Home Layout: Multi-level Home Equipment: Walker - 2 wheels;Bedside commode;Grab bars - tub/shower;Hand held shower  head      Prior Function Level of Independence: Independent               Hand Dominance        Extremity/Trunk Assessment   Upper Extremity Assessment: Defer to OT evaluation            Lower Extremity Assessment: LLE deficits/detail   LLE Deficits / Details: strength grossly 4/5; poor muscular endurance  Cervical / Trunk Assessment: Other exceptions  Communication   Communication: No difficulties  Cognition Arousal/Alertness: Awake/alert Behavior During Therapy: WFL for tasks assessed/performed Overall Cognitive Status: Within Functional Limits for tasks assessed                      General Comments      Exercises        Assessment/Plan    PT Assessment All further PT needs can be met in the next venue of care  PT Diagnosis Difficulty walking;Acute pain;Generalized weakness   PT Problem List Decreased strength;Decreased activity tolerance;Decreased balance;Decreased mobility;Decreased knowledge of use of DME;Decreased knowledge of precautions;Impaired sensation;Pain;Obesity  PT Treatment Interventions     PT Goals (Current goals can be found in the Care Plan section) Acute Rehab PT Goals Patient Stated Goal: return home  PT Goal Formulation: All assessment and education complete, DC therapy    Frequency     Barriers to discharge        Co-evaluation               End of Session Equipment Utilized During Treatment: Gait belt Activity Tolerance: Patient limited by fatigue Patient left: in bed;with call bell/phone within reach;with family/visitor present Nurse Communication: Mobility status;Need for lift equipment;Weight bearing status         Time: 0839 (time adjusted for finding equipment)-0928 PT Time Calculation (min) (ACUTE ONLY): 49 min   Charges:   PT Evaluation $Initial PT Evaluation Tier I: 1 Procedure PT Treatments $Gait Training: 23-37 mins   PT G Codes:        Aquilla Shambley 26-Apr-2015, 10:01 AM Pager [phone]

## 2015-03-31 NOTE — Clinical Social Work Note (Signed)
Clinical Social Worker received standing order referral for possible ST-SNF placement.  Chart reviewed.  PT/OT evaluations pending.  Spoke with RN Case Manager who will follow up with patient to discuss home health needs if appropriate at discharge.    CSW signing off - please re consult if social work needs arise.  Barbette Or, [place]

## 2015-03-31 NOTE — Care Management Utilization Note (Signed)
Utilization review completed. Deryn Massengale, RN Case Manager [phone]. 

## 2015-03-31 NOTE — Discharge Instructions (Signed)
Do not put weight on right leg Ok to shower in 6 days Call for increased pain, fevers, chills.

## 2015-03-31 NOTE — Evaluation (Signed)
Occupational Therapy Evaluation Patient Details Name: [name] MRN: [id number] DOB: [birth year] Today's Date: 03/31/2015    History of Present Illness Adm 8/4 for Rt SI fusion; NWB/TDWB PMHx- R TKR, DM, neuropathy, anxiety   Clinical Impression   Pt currently min assist for LB selfcare tasks secondary to TDWBing status in the RLE.  Will have 24 hour supervision at discharge from husband.  Pt has all needed OT DME at this time.  No follow-up OT recommended at this time.    Follow Up Recommendations  No OT follow up    Equipment Recommendations  None recommended by OT       Precautions / Restrictions Precautions Precautions: Fall Restrictions Weight Bearing Restrictions: Yes RLE Weight Bearing: Non weight bearing      Mobility Bed Mobility Overal bed mobility: Modified Independent             General bed mobility comments: Pt reports uses step stool at home due to height of bed--requires help for step (see stair training)  Transfers Overall transfer level: Needs assistance Equipment used: Rolling walker (2 wheeled) Transfers: Stand Pivot Transfers Sit to Stand: Min assist Stand pivot transfers: Min assist       General transfer comment: Pt still needing mod instructional cueing to maintain TDWBing status in the RLE.     Balance Overall balance assessment: Needs assistance Sitting-balance support: No upper extremity supported Sitting balance-Leahy Scale: Good     Standing balance support: Bilateral upper extremity supported Standing balance-Leahy Scale: Poor Standing balance comment: Pt needs use of UEs to maintain balance.                            ADL Overall ADL's : Needs assistance/impaired Eating/Feeding: Independent   Grooming: Wash/dry hands;Wash/dry face;Sitting;Set up   Upper Body Bathing: Set up;Sitting   Lower Body Bathing: Minimal assistance;Sit to/from stand (cueing for wieght bearing)   Upper Body Dressing : Set  up;Sitting   Lower Body Dressing: Minimal assistance;Sit to/from stand   Toilet Transfer: Minimal Scientist, forensic Details (indicate cue type and reason): stand pivot to 3:1 Toileting- Clothing Manipulation and Hygiene: Minimal assistance;Sit to/from stand   Tub/ Shower Transfer: Walk-in shower;Minimal assistance;Rolling walker;3 in 1;Ambulation   Functional mobility during ADLs: Minimal assistance General ADL Comments: Pt needed mod instructional cueing to maintain TDWBing precautions in the RLE during toilet and showe transfers.  Educated pt on AE for LB selfcare as pt cannot reach the right foot for dressing tasks at this time.  Pt has all DME and will have supervision from her spouse.  No further needs at this time.     Vision Vision Assessment?: No apparent visual deficits   Perception Perception Perception Tested?: No   Praxis Praxis Praxis tested?: Within functional limits    Pertinent Vitals/Pain Pain Assessment: 0-10 Pain Score: 2  Pain Location: Rt SI joint Pain Intervention(s): Monitored during session     Hand Dominance Right   Extremity/Trunk Assessment Upper Extremity Assessment Upper Extremity Assessment: Overall WFL for tasks assessed   Lower Extremity Assessment Lower Extremity Assessment: Defer to PT evaluation LLE Deficits / Details: strength grossly 4/5; poor muscular endurance LLE Sensation: history of peripheral neuropathy   Cervical / Trunk Assessment Cervical / Trunk Assessment: Normal Cervical / Trunk Exceptions: obesity   Communication Communication Communication: No difficulties   Cognition Arousal/Alertness: Awake/alert Behavior During Therapy: WFL for tasks assessed/performed Overall Cognitive Status: Within Functional Limits for tasks assessed  Home Living Family/patient expects to be discharged to:: Private residence Living Arrangements: Spouse/significant  other Available Help at Discharge: Family;Available 24 hours/day Type of Home: House Home Access: Stairs to enter [employer] of Steps: 4 Entrance Stairs-Rails: Right Home Layout: Multi-level Alternate Level Stairs-Number of Steps: 7 (7 up to bedroom; 7 down to kitchen level) Alternate Level Stairs-Rails: Right;Left Bathroom Shower/Tub: Occupational psychologist: Handicapped height Bathroom Accessibility: Yes   Home Equipment: Environmental consultant - 2 wheels;Bedside commode;Grab bars - tub/shower;Hand held shower head          Prior Functioning/Environment Level of Independence: Independent                      OT Goals(Current goals can be found in the care plan section) Acute Rehab OT Goals Patient Stated Goal: return home   OT Frequency:                End of Session Equipment Utilized During Treatment: Rolling walker;Gait belt Nurse Communication: Mobility status  Activity Tolerance: Patient tolerated treatment well Patient left: in bed;with call bell/phone within reach;with family/visitor present   Time: 1011-1055 OT Time Calculation (min): 44 min Charges:  OT General Charges $OT Visit: 1 Procedure OT Evaluation $Initial OT Evaluation Tier I: 1 Procedure OT Treatments $Self Care/Home Management : 23-37 mins  [name] OTR/L 03/31/2015, 11:09 AM

## 2015-03-31 NOTE — Care Management Note (Signed)
Case Management Note  Patient Details  Name: [name] MRN: [id number] Date of Birth: 09-Nov-1940  Subjective/Objective:     74 yr old female s/p Right sacroiliac joint fusion             Action/Plan:   Case manager spoke with patient and husband concerning home health and DME needs. Choice offered. Referral called to [place], [facility] Liaison.    Expected Discharge Date:   03/31/15               Expected Discharge Plan:   Home with Home Health  In-House Referral:  NA  Discharge planning Services  CM Consult  Post Acute Care Choice:  Durable Medical Equipment, Home Health Choice offered to:  Patient  DME Arranged:  Wheelchair manual DME Agency:  [place]:  PT [place]:  [place]  Status of Service:  Completed, signed off  Medicare Important Message Given:    Date Medicare IM Given:    Medicare IM give by:    Date Additional Medicare IM Given:    Additional Medicare Important Message give by:     If discussed at [place] of Stay Meetings, dates discussed:    Additional Comments:  Ninfa Meeker, RN 03/31/2015, 4:25 PM

## 2015-03-31 NOTE — Progress Notes (Signed)
    Subjective: 1 Day Post-Op Procedure(s) (LRB): RIGHT SACROILIAC JOINT FUSION (Right) Patient reports pain as 2 on 0-10 scale.   Denies CP or SOB.  Voiding without difficulty. Positive flatus. Objective: Vital signs in last 24 hours: Temp:  [97.6 F (36.4 C)-98.4 F (36.9 C)] 98.3 F (36.8 C) (08/05 0405) Pulse Rate:  [89-103] 94 (08/05 0405) Resp:  [8-19] 16 (08/05 0405) BP: (125-176)/(59-73) 127/59 mmHg (08/05 0405) SpO2:  [96 %-100 %] 98 % (08/05 0405)  Intake/Output from previous day: 08/04 0701 - 08/05 0700 In: 1540 [P.O.:240; I.V.:1300] Out: 1325 [Urine:1125; Blood:200] Intake/Output this shift:    Labs:  Recent Labs  03/29/15 1424  HGB 10.8*    Recent Labs  03/29/15 1424  WBC 9.3  RBC 3.44*  HCT 32.5*  PLT 502*    Recent Labs  03/29/15 1424  NA 133*  K 4.0  CL 97*  CO2 29  BUN 12  CREATININE 0.91  GLUCOSE 245*  CALCIUM 9.5   No results for input(s): LABPT, INR in the last 72 hours.  Physical Exam: Neurologically intact ABD soft Intact pulses distally Incision: dressing C/D/I Compartment soft  Assessment/Plan: 1 Day Post-Op Procedure(s) (LRB): RIGHT SACROILIAC JOINT FUSION (Right) Up with therapy  Doing well except for headache Will work with PT as mobilization (NWB) is difficult Plan on d/c today if cleared by PT  Melina Schools D for Dr. Melina Schools [facility] [phone] 03/31/2015, 8:03 AM

## 2015-04-03 ENCOUNTER — Encounter (HOSPITAL_COMMUNITY): Payer: Self-pay | Admitting: Orthopedic Surgery

## 2015-04-04 ENCOUNTER — Other Ambulatory Visit: Payer: Self-pay | Admitting: Gynecology

## 2015-04-06 ENCOUNTER — Encounter: Payer: Self-pay | Admitting: Internal Medicine

## 2015-04-18 ENCOUNTER — Encounter: Payer: Self-pay | Admitting: Gastroenterology

## 2015-05-03 ENCOUNTER — Other Ambulatory Visit: Payer: Self-pay

## 2015-05-07 MED ORDER — ESTROGENS, CONJUGATED 0.625 MG/GM VA CREA: 1.0000 | TOPICAL_CREAM | VAGINAL | Status: DC
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
## 2015-05-24 ENCOUNTER — Telehealth: Payer: Self-pay | Admitting: Gastroenterology

## 2015-05-24 NOTE — Telephone Encounter (Signed)
Left message for patient to call back  

## 2015-05-24 NOTE — Telephone Encounter (Signed)
Patient is scheduled for Nicoletta Ba PA on 06/19/15 10:30

## 2015-05-25 ENCOUNTER — Ambulatory Visit: Payer: Self-pay | Admitting: Physician Assistant

## 2015-05-30 ENCOUNTER — Ambulatory Visit: Payer: Medicare HMO | Admitting: Physician Assistant

## 2015-06-02 ENCOUNTER — Ambulatory Visit (INDEPENDENT_AMBULATORY_CARE_PROVIDER_SITE_OTHER): Payer: Medicare HMO

## 2015-06-02 DIAGNOSIS — Z23 Encounter for immunization: Secondary | ICD-10-CM | POA: Diagnosis not present

## 2015-06-06 ENCOUNTER — Ambulatory Visit: Payer: Medicare HMO | Admitting: Internal Medicine

## 2015-06-07 ENCOUNTER — Encounter: Payer: Self-pay | Admitting: Internal Medicine

## 2015-06-07 ENCOUNTER — Ambulatory Visit (INDEPENDENT_AMBULATORY_CARE_PROVIDER_SITE_OTHER): Payer: Medicare HMO | Admitting: Internal Medicine

## 2015-06-07 VITALS — BP 120/78 | HR 96 | Wt 157.0 lb

## 2015-06-07 DIAGNOSIS — M5441 Lumbago with sciatica, right side: Secondary | ICD-10-CM

## 2015-06-07 DIAGNOSIS — E118 Type 2 diabetes mellitus with unspecified complications: Secondary | ICD-10-CM

## 2015-06-07 DIAGNOSIS — M5431 Sciatica, right side: Secondary | ICD-10-CM

## 2015-06-07 NOTE — Assessment & Plan Note (Signed)
On Ultracet prn Hold Lovastatin x 1-2 wks to see iff pain gets better

## 2015-06-07 NOTE — Progress Notes (Signed)
Subjective:  Patient ID: [name], female    DOB: [birth year]  Age: 74 y.o. MRN: [id number]  CC: No chief complaint on file.   HPI DIVA LEMBERGER presents for LBP, HTN, DM, depression. Taking Tramadol 37.5 mg for pain  Outpatient Prescriptions Prior to Visit  Medication Sig Dispense Refill  . aspirin EC 81 MG tablet Take 1 tablet (81 mg total) by mouth daily. 100 tablet 3  . augmented betamethasone dipropionate (DIPROLENE AF) 0.05 % cream Apply topically 2 (two) times daily. (Patient taking differently: Apply 1 application topically 2 (two) times daily as needed (rosacea). ) 30 g 0  . b complex vitamins tablet Take 2 tablets by mouth daily. Vitamin supplement    . calcium-vitamin D (OSCAL WITH D) 500-200 MG-UNIT per tablet Take 1 tablet by mouth daily with breakfast.    . cetirizine (ZYRTEC) 10 MG tablet Take 10 mg by mouth daily as needed for allergies.     Marland Kitchen conjugated estrogens (PREMARIN) vaginal cream Place 1 Applicatorful vaginally 2 (two) times a week. 42.5 g 4  . dicyclomine (BENTYL) 10 MG capsule Take 1 capsule (10 mg total) by mouth 3 (three) times daily before meals. 90 capsule 2  . estradiol (CLIMARA - DOSED IN MG/24 HR) 0.0375 mg/24hr patch APPLY 1 PATCH ONTO SKIN ONCE WEEKLY 12 patch 4  . fluconazole (DIFLUCAN) 200 MG tablet TAKE 1 TABLET BY MOUTH DAILY 3 tablet 0  . furosemide (LASIX) 20 MG tablet TAKE 1-2 TABLETS BY MOUTH EVERY DAY 60 tablet 5  . insulin glargine (LANTUS) 100 UNIT/ML injection Inject 10 Units into the skin every evening.     Marland Kitchen L-Methylfolate (DEPLIN) 15 MG TABS Take 15 mg by mouth daily. For treatment of depression    . lansoprazole (PREVACID) 30 MG capsule Take 1 capsule (30 mg total) by mouth daily at 12 noon. 30 capsule 1  . Linaclotide (LINZESS) 290 MCG CAPS capsule Take 1 capsule (290 mcg total) by mouth daily as needed. 30 capsule 0  . Liraglutide (VICTOZA) 18 MG/3ML SOLN Inject 1.8 mg into the skin daily. For DM type 2    . LORazepam (ATIVAN)  0.5 MG tablet Take 0.5 mg by mouth 2 (two) times daily. 0.5 mg by mouth every morning. 0.5mg  at lunch and 1mg  at bedtime    . lovastatin (MEVACOR) 20 MG tablet TAKE 1 TABLET BY MOUTH EVERY DAY 90 tablet 3  . metFORMIN (GLUCOPHAGE-XR) 500 MG 24 hr tablet Take 1,000 mg by mouth 2 (two) times daily. For DM type 2    . methocarbamol (ROBAXIN) 500 MG tablet Take 1 tablet (500 mg total) by mouth 3 (three) times daily as needed for muscle spasms. 60 tablet 0  . Multiple Vitamins-Minerals (MULTIVITAMIN,TX-MINERALS) tablet Take 1 tablet by mouth daily.     . NONFORMULARY OR COMPOUNDED ITEM Testosterone cream--compounded 4% cream.  Apply small amount to skin nightly (Patient taking differently: Apply 1 application topically at bedtime. Testosterone cream--compounded 4% cream.  Apply small amount to skin nightly) 1 each 4  . nystatin-triamcinolone (MYCOLOG II) cream Apply 1 application topically 2 (two) times daily as needed.     . ondansetron (ZOFRAN) 4 MG tablet Take 1 tablet (4 mg total) by mouth every 8 (eight) hours as needed for nausea or vomiting. 20 tablet 0  . terconazole (TERAZOL 7) 0.4 % vaginal cream Place 1 applicator vaginally at bedtime. 45 g 1  . Vilazodone HCl (VIIBRYD) 40 MG TABS Take 40 mg by mouth daily.     Marland Kitchen  HYDROcodone-acetaminophen (NORCO) 5-325 MG per tablet Take 1 tablet by mouth every 6 (six) hours as needed for moderate pain. 50 tablet 0   No facility-administered medications prior to visit.    ROS Review of Systems  Constitutional: Negative for chills, activity change, appetite change, fatigue and unexpected weight change.  HENT: Negative for congestion, mouth sores and sinus pressure.   Eyes: Negative for visual disturbance.  Respiratory: Negative for cough and chest tightness.   Gastrointestinal: Positive for constipation. Negative for nausea, vomiting and abdominal pain.  Genitourinary: Negative for urgency, frequency, flank pain, difficulty urinating and vaginal pain.    Musculoskeletal: Positive for back pain. Negative for gait problem.  Skin: Negative for pallor and rash.  Neurological: Positive for headaches. Negative for dizziness, tremors, weakness and numbness.  Psychiatric/Behavioral: Positive for decreased concentration. Negative for suicidal ideas, confusion and sleep disturbance. The patient is nervous/anxious.     Objective:  BP 120/78 mmHg  Pulse 96  Wt 157 lb (71.215 kg)  SpO2 97%  BP Readings from Last 3 Encounters:  06/07/15 120/78  03/31/15 131/59  03/29/15 126/64    Wt Readings from Last 3 Encounters:  06/07/15 157 lb (71.215 kg)  03/29/15 160 lb 14.4 oz (72.984 kg)  03/15/15 173 lb (78.472 kg)    Physical Exam  Constitutional: She appears well-developed. No distress.  HENT:  Head: Normocephalic.  Right Ear: External ear normal.  Left Ear: External ear normal.  Nose: Nose normal.  Mouth/Throat: Oropharynx is clear and moist.  Eyes: Conjunctivae are normal. Pupils are equal, round, and reactive to light. Right eye exhibits no discharge. Left eye exhibits no discharge.  Neck: Normal range of motion. Neck supple. No JVD present. No tracheal deviation present. No thyromegaly present.  Cardiovascular: Normal rate, regular rhythm and normal heart sounds.   Pulmonary/Chest: No stridor. No respiratory distress. She has no wheezes.  Abdominal: Soft. Bowel sounds are normal. She exhibits no distension and no mass. There is no tenderness. There is no rebound and no guarding.  Musculoskeletal: She exhibits tenderness. She exhibits no edema.  Lymphadenopathy:    She has no cervical adenopathy.  Neurological: She displays normal reflexes. No cranial nerve deficit. She exhibits normal muscle tone. Coordination normal.  Skin: No rash noted. No erythema.  Psychiatric: She has a normal mood and affect. Her behavior is normal. Judgment and thought content normal.  LS is tender R buttock scar is healed  Lab Results  Component Value Date    WBC 9.3 03/29/2015   HGB 10.8* 03/29/2015   HCT 32.5* 03/29/2015   PLT 502* 03/29/2015   GLUCOSE 245* 03/29/2015   CHOL 119 05/10/2014   TRIG 77.0 05/10/2014   HDL 54.30 05/10/2014   LDLCALC 49 05/10/2014   ALT 19 11/02/2013   AST 27 11/02/2013   NA 133* 03/29/2015   K 4.0 03/29/2015   CL 97* 03/29/2015   CREATININE 0.91 03/29/2015   BUN 12 03/29/2015   CO2 29 03/29/2015   TSH 1.26 08/07/2010   INR 0.93 11/02/2013   HGBA1C 6.8* 03/29/2015   MICROALBUR 0.2 11/07/2006    Dg Si Joints  03/30/2015  CLINICAL DATA:  Right SI joint fusion for pain and degenerative disease. Intraoperative spot views. EXAM: DG C-ARM 61-120 MIN; BILATERAL SACROILIAC JOINTS - 3+ VIEW COMPARISON:  None. FINDINGS: Three fluoroscopic intraoperative spot views of the right sacroiliac joint are provided. Fusion hardware is in place across the right sacroiliac joint. Hardware appears intact. No fracture is identified. IMPRESSION: Right SI  joint effusion. Electronically Signed   By: Inge Rise M.D.   On: 03/30/2015 14:05   Dg C-arm 61-120 Min  03/30/2015  CLINICAL DATA:  Right SI joint fusion for pain and degenerative disease. Intraoperative spot views. EXAM: DG C-ARM 61-120 MIN; BILATERAL SACROILIAC JOINTS - 3+ VIEW COMPARISON:  None. FINDINGS: Three fluoroscopic intraoperative spot views of the right sacroiliac joint are provided. Fusion hardware is in place across the right sacroiliac joint. Hardware appears intact. No fracture is identified. IMPRESSION: Right SI joint effusion. Electronically Signed   By: Inge Rise M.D.   On: 03/30/2015 14:05    Assessment & Plan:   There are no diagnoses linked to this encounter. I have discontinued Ms. Gretzinger's HYDROcodone-acetaminophen. I am also having her maintain her b complex vitamins, (multivitamin,tx-minerals), metFORMIN, insulin glargine, LORazepam, Liraglutide, cetirizine, DEPLIN, calcium-vitamin D, aspirin EC, lansoprazole, nystatin-triamcinolone,  NONFORMULARY OR COMPOUNDED ITEM, Linaclotide, augmented betamethasone dipropionate, lovastatin, furosemide, Vilazodone HCl, estradiol, terconazole, dicyclomine, methocarbamol, ondansetron, fluconazole, conjugated estrogens, traMADol-acetaminophen, LANTUS SOLOSTAR, BD PEN NEEDLE NANO U/F, ONETOUCH DELICA LANCETS 19Q, VOLTAREN, metroNIDAZOLE, and pioglitazone.  Meds ordered this encounter  Medications  . traMADol-acetaminophen (ULTRACET) 37.5-325 MG tablet    Sig: Take 1 tablet by mouth 3 (three) times daily as needed.    Refill:  0  . LANTUS SOLOSTAR 100 UNIT/ML Solostar Pen    Sig:     Refill:  3  . BD PEN NEEDLE NANO U/F 32G X 4 MM MISC    Sig: USE AS DIRECTED TWICE DAILY.    Refill:  3  . ONETOUCH DELICA LANCETS 22W MISC    Sig: USE UTD UP TO QID    Refill:  1  . VOLTAREN 1 % GEL    Sig: APPLY 2 GRAMS TO KNEE 2-3 TIMES DAILY PRN    Refill:  1  . metroNIDAZOLE (METROCREAM) 0.75 % cream    Sig:   . pioglitazone (ACTOS) 45 MG tablet    Sig: Take 45 mg by mouth daily.    Refill:  3     Follow-up: Return in about 4 months (around 10/08/2015) for a follow-up visit.  Walker Kehr, MD

## 2015-06-07 NOTE — Assessment & Plan Note (Signed)
Victoza, Metformin, Lantus  

## 2015-06-07 NOTE — Assessment & Plan Note (Signed)
Getting better post-op

## 2015-06-07 NOTE — Progress Notes (Signed)
Pre visit review using our clinic review tool, if applicable. No additional management support is needed unless otherwise documented below in the visit note. 

## 2015-06-07 NOTE — Patient Instructions (Signed)
Chair yoga or [place]

## 2015-06-09 ENCOUNTER — Other Ambulatory Visit: Payer: Self-pay | Admitting: Internal Medicine

## 2015-06-19 ENCOUNTER — Encounter: Payer: Self-pay | Admitting: Physician Assistant

## 2015-06-19 ENCOUNTER — Ambulatory Visit (INDEPENDENT_AMBULATORY_CARE_PROVIDER_SITE_OTHER): Payer: Medicare HMO | Admitting: Physician Assistant

## 2015-06-19 VITALS — BP 124/84 | HR 72 | Ht 62.0 in | Wt 156.6 lb

## 2015-06-19 DIAGNOSIS — K5901 Slow transit constipation: Secondary | ICD-10-CM | POA: Diagnosis not present

## 2015-06-19 DIAGNOSIS — K589 Irritable bowel syndrome without diarrhea: Secondary | ICD-10-CM | POA: Diagnosis not present

## 2015-06-19 DIAGNOSIS — K219 Gastro-esophageal reflux disease without esophagitis: Secondary | ICD-10-CM | POA: Diagnosis not present

## 2015-06-19 DIAGNOSIS — Z86010 Personal history of colonic polyps: Secondary | ICD-10-CM

## 2015-06-19 MED ORDER — FAMOTIDINE 40 MG PO TABS: ORAL_TABLET | ORAL | Status: DC

## 2015-06-19 MED ORDER — LINACLOTIDE 145 MCG PO CAPS: 145.0000 ug | ORAL_CAPSULE | Freq: Every day | ORAL | Status: DC

## 2015-06-19 MED ORDER — DICYCLOMINE HCL 10 MG PO CAPS: ORAL_CAPSULE | ORAL | Status: DC

## 2015-06-19 MED ORDER — PANTOPRAZOLE SODIUM 40 MG PO TBEC: DELAYED_RELEASE_TABLET | ORAL | Status: DC

## 2015-06-19 NOTE — Progress Notes (Signed)
Reviewed and agree with management plan.  Malcolm T. Stark, MD FACG 

## 2015-06-19 NOTE — Patient Instructions (Addendum)
We sent prescriptions to [facility] [address]  1. Linzess 145 mcg 2. Bentyl ( didcyclomine)  3. Pantoprazole sodium ( Protonix )  4. Pepcid  Call us in December to schedule a colonoscopy with Dr. Fuller Plan for January and February.

## 2015-06-19 NOTE — Progress Notes (Signed)
Patient ID: [name], female   DOB: 1940-11-08, 74 y.o.   MRN: [id number]   Subjective:    Patient ID: [name], female    DOB: November 19, 1940, 74 y.o.   MRN: [id number]  HPI  [name] is a pleasant 74 year old white female known to Dr. Fuller Plan. She has history of chronic GERD IBS and chronic constipation. 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 She was recommended to have follow-up in 3 years. Last EGD October 2014 showed a small hiatal hernia. Patient comes in today with several concerns. She has been taking Prevacid 30 mg twice daily for some time and feels that it is not working as well as it used to she is having terrible reflux and indigestion symptoms and is frequently taking Gaviscon. She has no complaints of dysphagia or odynophagia. She is also had an increase in abdominal cramping recently. She says these are  her same symptoms she has with IBS but has noticed an increase. She has been taking the Bentyl generally once per day and was uncertain whether she could take it several times per day routinely. She also has been using Linzess  fairly regularly but has a 290 g tablet and says this will give her horrible diarrhea, show she opens the capsule and sprinkle's a little bit of the medicine which seems to work well. She says she can take it every day but benefits from taking it every 2-3 days. She's not had any melena or hematochezia or change in her bowel habits. She is aware she is due for follow-up colonoscopy. She did have a spine surgery in August 2016 for which she still recuperating.  Review of Systems Pertinent positive and negative review of systems were noted in the above HPI section.  All other review of systems was otherwise negative.  Outpatient Encounter Prescriptions as of 06/19/2015  Medication Sig  . aspirin EC 81 MG tablet Take 1 tablet (81 mg total) by mouth daily.    Marland Kitchen augmented betamethasone dipropionate (DIPROLENE AF) 0.05 % cream Apply topically 2 (two) times daily. (Patient taking differently: Apply 1 application topically 2 (two) times daily as needed (rosacea). )  . b complex vitamins tablet Take 2 tablets by mouth daily. Vitamin supplement  . BD PEN NEEDLE NANO U/F 32G X 4 MM MISC USE AS DIRECTED TWICE DAILY.  . calcium-vitamin D (OSCAL WITH D) 500-200 MG-UNIT per tablet Take 1 tablet by mouth daily with breakfast.  . cetirizine (ZYRTEC) 10 MG tablet Take 10 mg by mouth daily as needed for allergies.   Marland Kitchen conjugated estrogens (PREMARIN) vaginal cream Place 1 Applicatorful vaginally 2 (two) times a week.  . dicyclomine (BENTYL) 10 MG capsule Take 1 tab 2-3 times daily for cramping.  Marland Kitchen estradiol (CLIMARA - DOSED IN MG/24 HR) 0.0375 mg/24hr patch APPLY 1 PATCH ONTO SKIN ONCE WEEKLY  . fluconazole (DIFLUCAN) 200 MG tablet TAKE 1 TABLET BY MOUTH DAILY  . furosemide (LASIX) 20 MG tablet TAKE 1-2 TABLETS BY MOUTH EVERY DAY  . insulin glargine (LANTUS) 100 UNIT/ML injection Inject 10 Units into the skin every evening.   Marland Kitchen L-Methylfolate (DEPLIN) 15 MG TABS Take 15 mg by mouth daily. For treatment of depression  . lansoprazole (PREVACID) 30 MG capsule Take 1 capsule (30 mg total) by mouth daily at 12 noon.  . Liraglutide (VICTOZA) 18 MG/3ML SOLN Inject 1.8 mg  into the skin daily. For DM type 2  . LORazepam (ATIVAN) 0.5 MG tablet Take 0.5 mg by mouth 2 (two) times daily. 0.5 mg by mouth every morning. 0.5mg  at lunch and 1mg  at bedtime  . lovastatin (MEVACOR) 20 MG tablet TAKE 1 TABLET BY MOUTH EVERY DAY  . metFORMIN (GLUCOPHAGE-XR) 500 MG 24 hr tablet Take 1,000 mg by mouth 2 (two) times daily. For DM type 2  . metroNIDAZOLE (METROCREAM) 0.75 % cream   . Multiple Vitamins-Minerals (MULTIVITAMIN,TX-MINERALS) tablet Take 1 tablet by mouth daily.   . NONFORMULARY OR COMPOUNDED ITEM Testosterone cream--compounded 4% cream.  Apply small amount to skin nightly  (Patient taking differently: Apply 1 application topically at bedtime. Testosterone cream--compounded 4% cream.  Apply small amount to skin nightly)  . nystatin-triamcinolone (MYCOLOG II) cream Apply 1 application topically 2 (two) times daily as needed.   Glory Rosebush DELICA LANCETS 16X MISC USE UTD UP TO QID  . pioglitazone (ACTOS) 45 MG tablet Take 45 mg by mouth daily.  . traMADol-acetaminophen (ULTRACET) 37.5-325 MG tablet Take 1 tablet by mouth 3 (three) times daily as needed.  . Vilazodone HCl (VIIBRYD) 40 MG TABS Take 40 mg by mouth daily.   . VOLTAREN 1 % GEL APPLY 2 GRAMS TO KNEE 2-3 TIMES DAILY PRN  . [DISCONTINUED] dicyclomine (BENTYL) 10 MG capsule Take 1 capsule (10 mg total) by mouth 3 (three) times daily before meals.  . [DISCONTINUED] Linaclotide (LINZESS) 290 MCG CAPS capsule Take 1 capsule (290 mcg total) by mouth daily.  . famotidine (PEPCID) 40 MG tablet Take 1 tab in the morning before breakfast.  . Linaclotide (LINZESS) 145 MCG CAPS capsule Take 1 capsule (145 mcg total) by mouth daily.  . pantoprazole (PROTONIX) 40 MG tablet Take 1 tab in the morning before  Breakfast.  . [DISCONTINUED] LANTUS SOLOSTAR 100 UNIT/ML Solostar Pen   . [DISCONTINUED] methocarbamol (ROBAXIN) 500 MG tablet Take 1 tablet (500 mg total) by mouth 3 (three) times daily as needed for muscle spasms.  . [DISCONTINUED] ondansetron (ZOFRAN) 4 MG tablet Take 1 tablet (4 mg total) by mouth every 8 (eight) hours as needed for nausea or vomiting.  . [DISCONTINUED] terconazole (TERAZOL 7) 0.4 % vaginal cream Place 1 applicator vaginally at bedtime.   No facility-administered encounter medications on file as of 06/19/2015.   Allergies  Allergen Reactions  . Amlodipine     Dizzy, HA  . Celebrex [Celecoxib]     HAs  . Codeine   . Gabapentin     dizzy  . Hydrocodone     Feeling funny, nausea  . Meloxicam     jittery  . Pravastatin Sodium    Patient Active Problem List   Diagnosis Date Noted  . SI  (sacroiliac) pain 03/30/2015  . Sciatica of right side 08/08/2014  . Acute blood loss anemia 03/09/2014  . DJD (degenerative joint disease) of knee 11/10/2013  . Unspecified constipation 10/29/2013  . Obesity, unspecified 08/04/2013  . Osteoarthritis of right knee 08/04/2013  . Edema 05/11/2013  . Fall 09/16/2012  . Neck pain 09/16/2012  . LBP (low back pain) 09/16/2012  . Well adult exam 07/01/2012  . Acute bronchitis 05/21/2011  . FATIGUE 08/07/2010  . PARESTHESIA 08/07/2010  . DYSPHAGIA 07/13/2009  . Irritable bowel syndrome 09/24/2007  . LIVER FUNCTION TESTS, ABNORMAL 09/24/2007  . DM type 2, controlled, with complication ([place]) 09/60/4540  . Anxiety state 06/26/2007  . Depression 06/26/2007  . 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 heart failure 06/26/2007  .  GERD 06/26/2007  . OSTEOARTHRITIS 06/26/2007  . COLONIC POLYPS, HX OF 06/26/2007  . 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 Marital Status: Married    Spouse Name: N/A  . Number of Children: N/A  . Years of Education: N/A   Occupational History  . Retired    Social History Main Topics  . Smoking status: Never Smoker   . Smokeless tobacco: Never Used  . Alcohol Use: 0.0 oz/week    0 Standard drinks or equivalent per week     Comment: 11/11/2013 "might have a small glass of wine couple times/yr, if that" no drinking now  . Drug Use: No  . Sexual Activity: Yes    Birth Control/ Protection: Surgical, Post-menopausal     Comment: HYST, INTERCOURSE AGE 58, SEXUAL PARTNERS LESS THAN 5   Other Topics Concern  . Not on file   Social History Narrative   Daily Caffeine Use:  Rare   Regular Exercise -  YES [facility] [employer]   Married with 3 sons          [name]'s family history includes Breast cancer (age of onset: 74) in her sister; Colon cancer (age of onset: 22) in her mother; Colon polyps in her mother; Diabetes in her father.      Objective:     Filed Vitals:   06/19/15 1041  BP: 124/84  Pulse: 72    Physical Exam  well-developed older white female in no acute distress, pleasant blood pressure 124/84 pulse 72 height 5 foot 2 weight 156. HEENT ;nontraumatic normocephalic EOMI PERRLA sclera anicteric, Cardiovascular; regular rate and rhythm with S1-S2 no murmur or gallop, Pulmonary; clear bilaterally, Abdomen ;soft no focal tenderness no guarding or rebound no palpable mass or hepatosplenomegaly bowel sounds are present, Rectal ;exam not done, Extremities; no clubbing cyanosis or edema skin warm and dry, Neuropsych; mood and affect appropriate       Assessment & Plan:   # 1 74 yo female diabetic with chronic GERD - prevacid not working well #2 chronic constipation #3 IBS with periodic lower abdominal cramping #4 history of serrated adenomatous polyp February 2013 due for follow-up colonoscopy #5 adult-onset diabetes mellitus #6 anxiety/depression  Plan; Will switch PPI to Protonix 40 mg by mouth every morning and add Pepcid 40 mg every evening Decrease Linzess to 145 g daily she may use every 2-3 days Patient advised she could use Bentyl 10 mg by mouth 2-3 times daily on a regular scheduled basis Continue daily probiotic Antireflux regimen She would like to wait until after the first of the year to schedule follow-up colonoscopy with Dr. Fuller Plan. She will call back in January to schedule for colonoscopy.   Mandeep Ferch S Jeriel Vivanco PA-C 06/19/2015   Cc: Plotnikov, Evie Lacks, MD

## 2015-06-21 ENCOUNTER — Telehealth: Payer: Self-pay | Admitting: Physician Assistant

## 2015-06-21 NOTE — Telephone Encounter (Signed)
The patient called to verify the directions for the Pepcid. ( Famotidine) Told the patient to take Protonix 40 mg in the morning and the Pepcid in the evening.  ( I typed it wrong) .

## 2015-06-26 ENCOUNTER — Ambulatory Visit (INDEPENDENT_AMBULATORY_CARE_PROVIDER_SITE_OTHER): Payer: Medicare HMO | Admitting: Neurology

## 2015-06-26 ENCOUNTER — Encounter: Payer: Self-pay | Admitting: Neurology

## 2015-06-26 VITALS — BP 150/78 | HR 97 | Ht 62.0 in | Wt 157.0 lb

## 2015-06-26 DIAGNOSIS — R51 Headache with orthostatic component, not elsewhere classified: Secondary | ICD-10-CM

## 2015-06-26 DIAGNOSIS — R519 Headache, unspecified: Secondary | ICD-10-CM

## 2015-06-26 DIAGNOSIS — R110 Nausea: Secondary | ICD-10-CM | POA: Diagnosis not present

## 2015-06-26 DIAGNOSIS — M542 Cervicalgia: Secondary | ICD-10-CM

## 2015-06-26 DIAGNOSIS — G441 Vascular headache, not elsewhere classified: Secondary | ICD-10-CM | POA: Diagnosis not present

## 2015-06-26 MED ORDER — METHYLPREDNISOLONE 4 MG PO TBPK: ORAL_TABLET | ORAL | Status: DC

## 2015-06-26 MED ORDER — DIVALPROEX SODIUM ER 500 MG PO TB24: 500.0000 mg | ORAL_TABLET | Freq: Every day | ORAL | Status: DC

## 2015-06-26 NOTE — Patient Instructions (Addendum)
Remember to drink plenty of fluid, eat healthy meals and do not skip any meals. Try to eat protein with a every meal and eat a healthy snack such as fruit or nuts in between meals. Try to keep a regular sleep-wake schedule and try to exercise daily, particularly in the form of walking, 20-30 minutes a day, if you can.   As far as your medications are concerned, I would like to suggest: Depakote 500mg  at bedtime. Medrol dosepak as discussed.  As far as diagnostic testing: MRi of the brain, labs  I would like to see you back in 6 weeks, sooner if we need to. Please call us with any interim questions, concerns, problems, updates or refill requests.   Our phone number is [phone]. We also have an after hours call service for urgent matters and there is a physician on-call for urgent questions. For any emergencies you know to call 911 or go to the nearest emergency room
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
## 2015-06-27 ENCOUNTER — Telehealth: Payer: Self-pay | Admitting: *Deleted

## 2015-06-27 LAB — COMPREHENSIVE METABOLIC PANEL
ALBUMIN: 4.1 g/dL (ref 3.5–4.8)
ALT: 17 IU/L (ref 0–32)
AST: 20 IU/L (ref 0–40)
Albumin/Globulin Ratio: 1.8 (ref 1.1–2.5)
Alkaline Phosphatase: 66 IU/L (ref 39–117)
BUN / CREAT RATIO: 13 (ref 11–26)
BUN: 13 mg/dL (ref 8–27)
CHLORIDE: 95 mmol/L — AB (ref 97–106)
CO2: 25 mmol/L (ref 18–29)
Calcium: 10.2 mg/dL (ref 8.7–10.3)
Creatinine, Ser: 0.97 mg/dL (ref 0.57–1.00)
GFR calc non Af Amer: 58 mL/min/{1.73_m2} — ABNORMAL LOW (ref >59)
GFR, EST AFRICAN AMERICAN: 67 mL/min/{1.73_m2} (ref >59)
GLUCOSE: 134 mg/dL — AB (ref 65–99)
Globulin, Total: 2.3 g/dL (ref 1.5–4.5)
Potassium: 5.1 mmol/L (ref 3.5–5.2)
Sodium: 136 mmol/L (ref 136–144)
TOTAL PROTEIN: 6.4 g/dL (ref 6.0–8.5)

## 2015-06-27 LAB — C-REACTIVE PROTEIN: CRP: 2.6 mg/L (ref 0.0–4.9)

## 2015-06-27 LAB — SEDIMENTATION RATE: Sed Rate: 2 mm/h (ref 0–40)

## 2015-06-27 NOTE — Telephone Encounter (Signed)
LVM for pt to call about results. Ok to inform pt labs unremarkable per Dr Jaynee Eagles. Gave [facility] phone number and office hours.

## 2015-06-27 NOTE — Telephone Encounter (Signed)
-----   Message from Melvenia Beam, MD sent at 06/27/2015  8:00 AM EDT ----- Let patient know her labs were unremarkable please

## 2015-06-28 NOTE — Telephone Encounter (Signed)
Patient returned call. Pt advised labs were unremarkable.

## 2015-06-28 NOTE — Telephone Encounter (Signed)
[name], take a look

## 2015-07-02 ENCOUNTER — Encounter: Payer: Self-pay | Admitting: Neurology

## 2015-07-03 ENCOUNTER — Telehealth: Payer: Self-pay | Admitting: Neurology

## 2015-07-03 DIAGNOSIS — R51 Headache with orthostatic component, not elsewhere classified: Secondary | ICD-10-CM

## 2015-07-03 DIAGNOSIS — R519 Headache, unspecified: Secondary | ICD-10-CM

## 2015-07-03 NOTE — Telephone Encounter (Signed)
Thanks

## 2015-07-03 NOTE — Telephone Encounter (Signed)
LVM for [facility] imaging spine services to schedule pt for blood patch. Gave pt name and DOBx2 and [facility] phone number.

## 2015-07-03 NOTE — Telephone Encounter (Signed)
Thank you :)

## 2015-07-03 NOTE — Telephone Encounter (Signed)
[name], can you please order a blood patch for [name] please? And get it scheduled? [facility].

## 2015-07-03 NOTE — Telephone Encounter (Signed)
[name] - patient is having worsening headache. Can we find her an MRI sooner? She is not scheduled until the 17th. Let me know thanks

## 2015-07-03 NOTE — Telephone Encounter (Signed)
Patrick Jupiter, pt's husband returned my call and stated that Adea will be able to attend 11/10 @ 12:30pm @ [facility] for her MRI. I went ahead and canceled her MRI appointment @ [facility]. Thanks!

## 2015-07-03 NOTE — Telephone Encounter (Signed)
I scheduled her @ [facility] for this coming Thursday 11/10 @ 12:30 check in. I called the patient (on both numbers on file) left a message for her to see if  That appointment would work for her. I am waiting for her to return my call to confirm that appointment will work for her.  Thanks!

## 2015-07-04 NOTE — Telephone Encounter (Signed)
Pt scheduled for Friday for blood patch.

## 2015-07-04 NOTE — Telephone Encounter (Signed)
Caroline with [facility] called and has an order for Blood Patch. Pt will probably be seen on Friday . Any questions please call [phone]

## 2015-07-05 DIAGNOSIS — R51 Headache: Secondary | ICD-10-CM | POA: Diagnosis not present

## 2015-07-06 ENCOUNTER — Encounter: Payer: Self-pay | Admitting: Neurology

## 2015-07-06 ENCOUNTER — Ambulatory Visit (INDEPENDENT_AMBULATORY_CARE_PROVIDER_SITE_OTHER): Payer: Self-pay

## 2015-07-06 DIAGNOSIS — R519 Headache, unspecified: Secondary | ICD-10-CM

## 2015-07-06 DIAGNOSIS — Z0289 Encounter for other administrative examinations: Secondary | ICD-10-CM

## 2015-07-06 DIAGNOSIS — M542 Cervicalgia: Secondary | ICD-10-CM

## 2015-07-06 DIAGNOSIS — R51 Headache with orthostatic component, not elsewhere classified: Secondary | ICD-10-CM

## 2015-07-06 DIAGNOSIS — R110 Nausea: Secondary | ICD-10-CM

## 2015-07-07 ENCOUNTER — Ambulatory Visit
Admission: RE | Admit: 2015-07-07 | Discharge: 2015-07-07 | Disposition: A | Discharge status: Home / Self Care | Payer: Medicare HMO | Source: Ambulatory Visit | Attending: Neurology | Admitting: Neurology

## 2015-07-07 DIAGNOSIS — R519 Headache, unspecified: Secondary | ICD-10-CM

## 2015-07-07 DIAGNOSIS — R51 Headache with orthostatic component, not elsewhere classified: Secondary | ICD-10-CM

## 2015-07-07 MED ORDER — IOHEXOL 180 MG/ML  SOLN: 1.0000 mL | Freq: Once | INTRAMUSCULAR | Status: DC | PRN

## 2015-07-07 NOTE — Discharge Instructions (Signed)

## 2015-07-10 ENCOUNTER — Encounter: Payer: Self-pay | Admitting: Neurology

## 2015-07-11 ENCOUNTER — Other Ambulatory Visit: Payer: Self-pay | Admitting: Neurology

## 2015-07-11 ENCOUNTER — Telehealth: Payer: Self-pay | Admitting: *Deleted

## 2015-07-11 ENCOUNTER — Telehealth: Payer: Self-pay | Admitting: Neurology

## 2015-07-11 MED ORDER — TOPIRAMATE 25 MG PO TABS: 25.0000 mg | ORAL_TABLET | Freq: Two times a day (BID) | ORAL | Status: DC

## 2015-07-11 NOTE — Telephone Encounter (Signed)
Spoke to patient. Discussed findings below. Consider MRI normal for age. Depakote gave her side effects. Can try topiramate for headache. MRi of the bran did not show findings of intracranial hypotension and blood patch did not work. Will treat for migraines.   Abnormal MRI brain (with and without) demonstrating: 1. Mild scattered periventricular and subcortical chronic small vessel ischemic disease.  2. Mild perisylvian and moderate bi-parietal atrophy. 3. No acute findings.

## 2015-07-11 NOTE — Telephone Encounter (Signed)
Called pt. Advised per Dr Jaynee Eagles that MRI showed no reason for headaches and knows blood patch did not help. Dr Jaynee Eagles will call her today to talk about starting another medication. Pt verbalized understanding.

## 2015-07-11 NOTE — Telephone Encounter (Signed)
-----   Message from Melvenia Beam, MD sent at 07/11/2015  9:08 AM EST ----- MRi of the brain did not show any reason for her headaches. I hear the blood patch did not work. Let patient know I will call her today to discuss starting another medication, thanks

## 2015-07-12 ENCOUNTER — Ambulatory Visit (INDEPENDENT_AMBULATORY_CARE_PROVIDER_SITE_OTHER): Payer: Medicare HMO | Admitting: Internal Medicine

## 2015-07-12 ENCOUNTER — Encounter: Payer: Self-pay | Admitting: Internal Medicine

## 2015-07-12 VITALS — BP 132/84 | HR 90 | Temp 97.6°F | Ht 62.0 in | Wt 157.0 lb

## 2015-07-12 DIAGNOSIS — I119 Hypertensive heart disease without heart failure: Secondary | ICD-10-CM

## 2015-07-12 DIAGNOSIS — R21 Rash and other nonspecific skin eruption: Secondary | ICD-10-CM | POA: Diagnosis not present

## 2015-07-12 DIAGNOSIS — E118 Type 2 diabetes mellitus with unspecified complications: Secondary | ICD-10-CM | POA: Diagnosis not present

## 2015-07-12 MED ORDER — TRIAMCINOLONE ACETONIDE 0.1 % EX CREA: 1.0000 "application " | TOPICAL_CREAM | Freq: Two times a day (BID) | CUTANEOUS | Status: DC

## 2015-07-12 MED ORDER — METHYLPREDNISOLONE ACETATE 80 MG/ML IJ SUSP
80.0000 mg | Freq: Once | INTRAMUSCULAR | Status: AC
  Administered 2015-07-12: 80 mg via INTRAMUSCULAR

## 2015-07-12 NOTE — Patient Instructions (Signed)
You had the steroid shot today  Please take all new medication as prescribed - the topical steroid cream as well  Please continue all other medications as before, and refills have been done if requested.  Please have the pharmacy call with any other refills you may need.  Please keep your appointments with your specialists as you may have planned

## 2015-07-13 ENCOUNTER — Other Ambulatory Visit: Payer: Medicare HMO

## 2015-07-15 NOTE — Assessment & Plan Note (Signed)
stable overall by history and exam, recent data reviewed with pt, and pt to continue medical treatment as before,  to f/u any worsening symptoms or concerns Lab Results  Component Value Date   HGBA1C 6.8* 03/29/2015   Pt to call for onset polys or cbg > 200 on tx,  to f/u any worsening symptoms or concerns

## 2015-07-15 NOTE — Assessment & Plan Note (Signed)
C/w allergic rash, ?angioedema, no recent contact, food or other allergens, for depomedrol IM, triam cr prn,  to f/u any worsening symptoms or concerns

## 2015-07-15 NOTE — Assessment & Plan Note (Signed)
stable overall by history and exam, recent data reviewed with pt, and pt to continue medical treatment as before,  to f/u any worsening symptoms or concerns BP Readings from Last 3 Encounters:  07/12/15 132/84  06/26/15 150/78  06/19/15 124/84
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
## 2015-07-24 ENCOUNTER — Other Ambulatory Visit: Payer: Self-pay | Admitting: Gynecology

## 2015-07-25 ENCOUNTER — Ambulatory Visit: Payer: Medicare HMO | Admitting: Women's Health

## 2015-07-26 ENCOUNTER — Other Ambulatory Visit: Payer: Self-pay

## 2015-07-26 MED ORDER — FUROSEMIDE 20 MG PO TABS: 20.0000 mg | ORAL_TABLET | Freq: Two times a day (BID) | ORAL | Status: DC

## 2015-07-27 ENCOUNTER — Ambulatory Visit (INDEPENDENT_AMBULATORY_CARE_PROVIDER_SITE_OTHER): Payer: Medicare HMO | Admitting: Women's Health

## 2015-07-27 ENCOUNTER — Encounter: Payer: Self-pay | Admitting: Women's Health

## 2015-07-27 VITALS — BP 121/82

## 2015-07-27 DIAGNOSIS — N898 Other specified noninflammatory disorders of vagina: Secondary | ICD-10-CM

## 2015-07-27 LAB — WET PREP FOR TRICH, YEAST, CLUE
Clue Cells Wet Prep HPF POC: NONE SEEN
TRICH WET PREP: NONE SEEN
YEAST WET PREP: NONE SEEN

## 2015-07-27 MED ORDER — HYLAFEM VA SUPP: 1.0000 | VAGINAL | Status: DC

## 2015-07-27 NOTE — Progress Notes (Signed)
Patient ID: [name], female   DOB: 02-17-1941, 74 y.o.   MRN: [id number] Presents with complaint of persistent vaginal burning, irritation, itching. Used Terazol with increased symptoms. Currently on estradiol patch 0.0375 and small amount of Premarin vaginal cream at bedtime. No relief. Has had increased problems this past year. Denies urinary symptoms of incontinence, pain, burning or frequency. Denies abdominal pain. Continues to have right hip pain, had hip surgery 03/2015.  Exam: Appears well, younger than stated years. External genitalia  extremely erythematous-  labias and at introitus, +1 cystocele, wet prep with a Q-tip, negative.  Vaginal irritation/atrophy  Plan: Reviewed wet prep negative irritation evident, reviewed trying vaginal probiotic Hylafem at bedtime 3. Call if no relief. Continue small amount of vaginal estrogen at bedtime. Also reviewed trying a different vaginal estrogen Vagifem.

## 2015-07-30 ENCOUNTER — Other Ambulatory Visit: Payer: Self-pay | Admitting: Gastroenterology

## 2015-08-03 ENCOUNTER — Encounter: Payer: Self-pay | Admitting: Neurology

## 2015-08-03 ENCOUNTER — Ambulatory Visit (INDEPENDENT_AMBULATORY_CARE_PROVIDER_SITE_OTHER): Payer: Medicare HMO | Admitting: Neurology

## 2015-08-03 VITALS — BP 121/77 | HR 95 | Ht 62.0 in | Wt 154.8 lb

## 2015-08-03 DIAGNOSIS — R51 Headache: Secondary | ICD-10-CM | POA: Diagnosis not present

## 2015-08-03 DIAGNOSIS — G4710 Hypersomnia, unspecified: Secondary | ICD-10-CM | POA: Diagnosis not present

## 2015-08-03 DIAGNOSIS — R0683 Snoring: Secondary | ICD-10-CM

## 2015-08-03 DIAGNOSIS — R519 Headache, unspecified: Secondary | ICD-10-CM

## 2015-08-03 DIAGNOSIS — R400 Somnolence: Secondary | ICD-10-CM
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
## 2015-08-03 NOTE — Patient Instructions (Signed)
Overall you are doing fairly well but I do want to suggest a few things today:   Remember to drink plenty of fluid, eat healthy meals and do not skip any meals. Try to eat protein with a every meal and eat a healthy snack such as fruit or nuts in between meals. Try to keep a regular sleep-wake schedule and try to exercise daily, particularly in the form of walking, 20-30 minutes a day, if you can.   As far as your medications are concerned, I would like to suggest  As far as diagnostic testing:   I would like to see you back in XXXXX, sooner if we need to. Please call us with any interim questions, concerns, problems, updates or refill requests.   Please also call us for any test results so we can go over those with you on the phone.  My clinical assistant and will answer any of your questions and relay your messages to me and also relay most of my messages to you.   Our phone number is [phone]. We also have an after hours call service for urgent matters and there is a physician on-call for urgent questions. For any emergencies you know to call 911 or go to the nearest emergency room   

## 2015-08-07 ENCOUNTER — Ambulatory Visit: Payer: Medicare HMO | Admitting: Neurology

## 2015-08-08 ENCOUNTER — Other Ambulatory Visit: Payer: Self-pay | Admitting: Women's Health

## 2015-08-08 ENCOUNTER — Telehealth: Payer: Self-pay | Admitting: *Deleted

## 2015-08-08 DIAGNOSIS — B373 Candidiasis of vulva and vagina: Secondary | ICD-10-CM

## 2015-08-08 DIAGNOSIS — B3731 Acute candidiasis of vulva and vagina: Secondary | ICD-10-CM

## 2015-08-08 MED ORDER — FLUCONAZOLE 200 MG PO TABS: 200.0000 mg | ORAL_TABLET | Freq: Every day | ORAL | Status: DC

## 2015-08-08 NOTE — Telephone Encounter (Signed)
Pt called to follow up from [place] on 07/27/15 completed 3 days dose of Hylafem, states vaginal irritation was gone while on medication but on day 4 it returned again. Pt asked what can she do next, still has itching and lots of irritation. Please advise

## 2015-08-08 NOTE — Telephone Encounter (Signed)
Telephone call, states continues with vaginal itching, burning, irritation. Has used Diflucan in the past per Dr. Phineas Real which has helped. Would like to try again. Diflucan 200 mg every other day 3 doses prescription E scribed. Instructed to call if no relief.

## 2015-08-08 NOTE — Telephone Encounter (Signed)
Pt asked if you will call her at [phone] she doesn't believe the OTC is going to work, infection has gotten worse

## 2015-08-08 NOTE — Telephone Encounter (Signed)
Have her try an OTC vaginal probiotic cream, it may help

## 2015-08-25 ENCOUNTER — Other Ambulatory Visit: Payer: Self-pay

## 2015-08-25 DIAGNOSIS — B373 Candidiasis of vulva and vagina: Secondary | ICD-10-CM

## 2015-08-25 DIAGNOSIS — B3731 Acute candidiasis of vulva and vagina: Secondary | ICD-10-CM

## 2015-08-28 ENCOUNTER — Other Ambulatory Visit: Payer: Self-pay

## 2015-08-28 DIAGNOSIS — B3731 Acute candidiasis of vulva and vagina: Secondary | ICD-10-CM

## 2015-08-28 DIAGNOSIS — B373 Candidiasis of vulva and vagina: Secondary | ICD-10-CM

## 2015-08-28 MED ORDER — FLUCONAZOLE 200 MG PO TABS: 200.0000 mg | ORAL_TABLET | Freq: Every day | ORAL | Status: DC

## 2015-08-28 NOTE — Telephone Encounter (Signed)
Spoke with patient. No symptoms at all currently. Was just getting the Rx to have it on hand.

## 2015-08-28 NOTE — Telephone Encounter (Signed)
Please call and review Diflucan can be toxic to the liver and best not to repeat. Ask her what her symptoms are? does she have any UTI symptoms or only vaginal itching?

## 2015-08-28 NOTE — Telephone Encounter (Signed)
Okay for refill but review best not to take it now, only use if needed.

## 2015-08-29 ENCOUNTER — Encounter: Payer: Self-pay | Admitting: Neurology

## 2015-08-29 ENCOUNTER — Ambulatory Visit (INDEPENDENT_AMBULATORY_CARE_PROVIDER_SITE_OTHER): Payer: PPO | Admitting: Neurology

## 2015-08-29 VITALS — BP 132/70 | HR 78 | Resp 16 | Ht 62.0 in | Wt 154.0 lb

## 2015-08-29 DIAGNOSIS — G4719 Other hypersomnia: Secondary | ICD-10-CM

## 2015-08-29 DIAGNOSIS — R51 Headache: Secondary | ICD-10-CM

## 2015-08-29 DIAGNOSIS — R0681 Apnea, not elsewhere classified: Secondary | ICD-10-CM

## 2015-08-29 DIAGNOSIS — L710 Perioral dermatitis: Secondary | ICD-10-CM | POA: Diagnosis not present

## 2015-08-29 DIAGNOSIS — R519 Headache, unspecified: Secondary | ICD-10-CM

## 2015-08-29 DIAGNOSIS — R0683 Snoring: Secondary | ICD-10-CM

## 2015-08-29 NOTE — Patient Instructions (Signed)
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
## 2015-09-18 ENCOUNTER — Other Ambulatory Visit: Payer: Self-pay

## 2015-09-18 MED ORDER — DICYCLOMINE HCL 10 MG PO CAPS: ORAL_CAPSULE | ORAL | Status: DC

## 2015-09-21 ENCOUNTER — Ambulatory Visit (INDEPENDENT_AMBULATORY_CARE_PROVIDER_SITE_OTHER): Payer: PPO | Admitting: Neurology

## 2015-09-21 DIAGNOSIS — G4720 Circadian rhythm sleep disorder, unspecified type: Secondary | ICD-10-CM

## 2015-09-21 DIAGNOSIS — G4733 Obstructive sleep apnea (adult) (pediatric): Secondary | ICD-10-CM

## 2015-09-21 DIAGNOSIS — R9431 Abnormal electrocardiogram [ECG] [EKG]: Secondary | ICD-10-CM

## 2015-09-21 NOTE — Sleep Study (Signed)
Please see the scanned sleep study interpretation located in the procedure tab in the chart view section.  

## 2015-09-26 DIAGNOSIS — Z4889 Encounter for other specified surgical aftercare: Secondary | ICD-10-CM | POA: Diagnosis not present

## 2015-09-27 ENCOUNTER — Telehealth: Payer: Self-pay | Admitting: Neurology

## 2015-09-27 DIAGNOSIS — R9431 Abnormal electrocardiogram [ECG] [EKG]: Secondary | ICD-10-CM

## 2015-09-27 DIAGNOSIS — G4720 Circadian rhythm sleep disorder, unspecified type: Secondary | ICD-10-CM

## 2015-09-27 DIAGNOSIS — G4733 Obstructive sleep apnea (adult) (pediatric): Secondary | ICD-10-CM

## 2015-09-27 NOTE — Telephone Encounter (Signed)
Patient referred by Dr. Jaynee Eagles, seen by me on 08/29/15, diagnostic PSG on 09/21/15, ins: MCR/[employer].   Please call and notify the patient that the recent sleep study did confirm the diagnosis of obstructive sleep apnea and that I recommend treatment for this in the form of CPAP. This will require a repeat sleep study for proper titration and mask fitting. Please explain to patient and arrange for a CPAP titration study. I have placed an order in the chart. Thanks, and please route to [facility] for scheduling next sleep study.  Star Age, MD, PhD [facility])

## 2015-09-28 NOTE — Telephone Encounter (Signed)
Called patient and she is aware of results and recommendation. She asked to see Dr. Rexene Alberts to discuss results. I will work patient in to see Dr. Rexene Alberts. Patient requested a copy to be mailed to her, copy mailed out today. Will send copy to PCP.

## 2015-09-29 DIAGNOSIS — J301 Allergic rhinitis due to pollen: Secondary | ICD-10-CM | POA: Diagnosis not present

## 2015-09-29 DIAGNOSIS — K219 Gastro-esophageal reflux disease without esophagitis: Secondary | ICD-10-CM | POA: Diagnosis not present

## 2015-09-29 DIAGNOSIS — J3089 Other allergic rhinitis: Secondary | ICD-10-CM | POA: Diagnosis not present

## 2015-09-29 DIAGNOSIS — R21 Rash and other nonspecific skin eruption: Secondary | ICD-10-CM | POA: Diagnosis not present

## 2015-10-04 ENCOUNTER — Telehealth: Payer: Self-pay | Admitting: Neurology

## 2015-10-04 NOTE — Telephone Encounter (Signed)
Patient called and states she will not have the CPAP until she speaks with Dr. Rexene Alberts and her PCP and another Dr in [place].

## 2015-10-04 NOTE — Telephone Encounter (Signed)
I offered her an appt tomorrow at 10 but she was unable to make it. I told her that I will still keep an eye for an opening.

## 2015-10-04 NOTE — Telephone Encounter (Signed)
I have patient's name on my desk to work in. She states that she only wants to come in between 11-2.

## 2015-10-05 ENCOUNTER — Telehealth: Payer: Self-pay

## 2015-10-05 NOTE — Telephone Encounter (Signed)
I spoke to patient and offered her appt on 2/14, she was able to take it.

## 2015-10-10 ENCOUNTER — Encounter: Payer: Self-pay | Admitting: Neurology

## 2015-10-10 ENCOUNTER — Ambulatory Visit (INDEPENDENT_AMBULATORY_CARE_PROVIDER_SITE_OTHER): Payer: PPO | Admitting: Neurology

## 2015-10-10 VITALS — BP 132/76 | HR 80 | Resp 16 | Ht 62.0 in | Wt 155.0 lb

## 2015-10-10 DIAGNOSIS — G4733 Obstructive sleep apnea (adult) (pediatric): Secondary | ICD-10-CM | POA: Diagnosis not present
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
## 2015-10-10 NOTE — Patient Instructions (Signed)
We will schedule you for a second sleep study called CPAP titration.  I will see you back soon thereafter.

## 2015-10-11 ENCOUNTER — Ambulatory Visit: Payer: Medicare HMO | Admitting: Internal Medicine

## 2015-10-17 ENCOUNTER — Encounter: Payer: Self-pay | Admitting: Internal Medicine

## 2015-10-17 ENCOUNTER — Ambulatory Visit (INDEPENDENT_AMBULATORY_CARE_PROVIDER_SITE_OTHER): Payer: PPO | Admitting: Internal Medicine

## 2015-10-17 VITALS — BP 110/70 | HR 98 | Temp 98.0°F | Wt 156.0 lb

## 2015-10-17 DIAGNOSIS — I119 Hypertensive heart disease without heart failure: Secondary | ICD-10-CM | POA: Diagnosis not present

## 2015-10-17 DIAGNOSIS — M5441 Lumbago with sciatica, right side: Secondary | ICD-10-CM | POA: Diagnosis not present

## 2015-10-17 DIAGNOSIS — M542 Cervicalgia: Secondary | ICD-10-CM

## 2015-10-17 DIAGNOSIS — J069 Acute upper respiratory infection, unspecified: Secondary | ICD-10-CM

## 2015-10-17 DIAGNOSIS — E1165 Type 2 diabetes mellitus with hyperglycemia: Secondary | ICD-10-CM | POA: Diagnosis not present

## 2015-10-17 DIAGNOSIS — F411 Generalized anxiety disorder: Secondary | ICD-10-CM | POA: Diagnosis not present

## 2015-10-17 DIAGNOSIS — E7800 Pure hypercholesterolemia, unspecified: Secondary | ICD-10-CM | POA: Diagnosis not present

## 2015-10-17 DIAGNOSIS — E118 Type 2 diabetes mellitus with unspecified complications: Secondary | ICD-10-CM

## 2015-10-17 MED ORDER — AZITHROMYCIN 250 MG PO TABS: ORAL_TABLET | ORAL | Status: DC

## 2015-10-17 NOTE — Assessment & Plan Note (Signed)
Zpac if worse 

## 2015-10-17 NOTE — Patient Instructions (Signed)
Use over-the-counter  "cold" medicines. Avoid decongestants if you have high blood pressure and use "Afrin" nasal spray for nasal congestion as directed instead. Use" Delsym" or" Robitussin" cough syrup varietis for cough.  You can use plain "Tylenol" or "Advil" for fever, chills and achyness. Use Halls or Ricola cough drops.   "Common cold" symptoms are usually triggered by a virus.  The antibiotics are usually not necessary. On average, a" viral cold" illness would take 4-7 days to resolve. Please, make an appointment if you are not better or if you're worse.

## 2015-10-17 NOTE — Assessment & Plan Note (Signed)
-   Dr Rolena Infante for LBP - (fusion of R SI joint per pt) s/p surgery 8/16 Better

## 2015-10-17 NOTE — Assessment & Plan Note (Signed)
Dr Kaur On prn Xanax 

## 2015-10-17 NOTE — Progress Notes (Signed)
Subjective:  Patient ID: [name], female    DOB: [birth year]  Age: 75 y.o. MRN: [id number]  CC: No chief complaint on file.   HPI LEGACEE CREEKMORE presents for LBP, DM2, depression f/u. C/o ST, dry cough and nasal congestion x 2 d  Outpatient Prescriptions Prior to Visit  Medication Sig Dispense Refill  . aspirin EC 81 MG tablet Take 1 tablet (81 mg total) by mouth daily. 100 tablet 3  . augmented betamethasone dipropionate (DIPROLENE AF) 0.05 % cream Apply topically 2 (two) times daily. (Patient taking differently: Apply 1 application topically 2 (two) times daily as needed (rosacea). ) 30 g 0  . b complex vitamins tablet Take 2 tablets by mouth daily. Vitamin supplement    . BD PEN NEEDLE NANO U/F 32G X 4 MM MISC USE AS DIRECTED TWICE DAILY.  3  . calcium-vitamin D (OSCAL WITH D) 500-200 MG-UNIT per tablet Take 1 tablet by mouth daily with breakfast.    . cetirizine (ZYRTEC) 10 MG tablet Take 10 mg by mouth daily as needed for allergies.     Marland Kitchen conjugated estrogens (PREMARIN) vaginal cream Place 1 Applicatorful vaginally 2 (two) times a week. 42.5 g 4  . dicyclomine (BENTYL) 10 MG capsule TAKE 1 CAPSULE (10 MG TOTAL) BY MOUTH 3 (THREE) TIMES DAILY BEFORE MEALS. 270 capsule 1  . estradiol (CLIMARA - DOSED IN MG/24 HR) 0.0375 mg/24hr patch APPLY 1 PATCH ONTO SKIN ONCE WEEKLY 12 patch 4  . famotidine (PEPCID) 40 MG tablet Take 40 mg by mouth every morning.  7  . furosemide (LASIX) 20 MG tablet Take 1-2 tablets (20-40 mg total) by mouth 2 (two) times daily. 180 tablet 1  . insulin glargine (LANTUS) 100 UNIT/ML injection Inject 10 Units into the skin every evening.     Marland Kitchen L-Methylfolate (DEPLIN) 15 MG TABS Take 15 mg by mouth daily. For treatment of depression    . Linaclotide (LINZESS) 145 MCG CAPS capsule Take 1 capsule (145 mcg total) by mouth daily. 30 capsule 11  . LORazepam (ATIVAN) 1 MG tablet Take 1 mg by mouth 4 (four) times daily as needed.  2  . lovastatin (MEVACOR) 20 MG  tablet TAKE 1 TABLET BY MOUTH EVERY DAY 90 tablet 3  . metFORMIN (GLUCOPHAGE-XR) 500 MG 24 hr tablet Take 1,000 mg by mouth 2 (two) times daily. For DM type 2    . metroNIDAZOLE (METROCREAM) 0.75 % cream     . Multiple Vitamins-Minerals (MULTIVITAMIN,TX-MINERALS) tablet Take 1 tablet by mouth daily.     Glory Rosebush DELICA LANCETS 99991111 MISC USE UTD UP TO QID  1  . ONETOUCH VERIO test strip     . pantoprazole (PROTONIX) 40 MG tablet Take 1 tab in the morning before  Breakfast. 30 tablet 11  . pioglitazone (ACTOS) 45 MG tablet Take 45 mg by mouth daily.  3  . Testosterone POWD APPLY A TINY PEA-SIZED AMOUNT ONCE A DAY. 30 g 5  . VICTOZA 18 MG/3ML SOPN ADM 1.8 MG [place] QD  0  . Vilazodone HCl (VIIBRYD) 40 MG TABS Take 40 mg by mouth daily.     . VOLTAREN 1 % GEL APPLY 2 GRAMS TO KNEE 2-3 TIMES DAILY PRN  1   No facility-administered medications prior to visit.    ROS Review of Systems  Constitutional: Positive for chills and fatigue. Negative for activity change, appetite change and unexpected weight change.  HENT: Positive for congestion, sinus pressure and sore throat. Negative for  mouth sores.   Eyes: Negative for visual disturbance.  Respiratory: Positive for cough. Negative for chest tightness.   Gastrointestinal: Negative for nausea and abdominal pain.  Genitourinary: Negative for frequency, difficulty urinating and vaginal pain.  Musculoskeletal: Positive for back pain, arthralgias and gait problem.  Skin: Negative for pallor and rash.  Neurological: Negative for dizziness, tremors, weakness, numbness and headaches.  Psychiatric/Behavioral: Negative for confusion and sleep disturbance.    Objective:  BP 110/70 mmHg  Pulse 98  Temp(Src) 98 F (36.7 C) (Oral)  Wt 156 lb (70.761 kg)  SpO2 98%  BP Readings from Last 3 Encounters:  10/17/15 110/70  10/10/15 132/76  08/29/15 132/70    Wt Readings from Last 3 Encounters:  10/17/15 156 lb (70.761 kg)  10/10/15 155 lb (70.308 kg)    08/29/15 154 lb (69.854 kg)    Physical Exam  Constitutional: She appears well-developed. No distress.  HENT:  Head: Normocephalic.  Right Ear: External ear normal.  Left Ear: External ear normal.  Nose: Nose normal.  Mouth/Throat: Oropharynx is clear and moist.  Eyes: Conjunctivae are normal. Pupils are equal, round, and reactive to light. Right eye exhibits no discharge. Left eye exhibits no discharge.  Neck: Normal range of motion. Neck supple. No JVD present. No tracheal deviation present. No thyromegaly present.  Cardiovascular: Normal rate, regular rhythm and normal heart sounds.   Pulmonary/Chest: No stridor. No respiratory distress. She has no wheezes.  Abdominal: Soft. Bowel sounds are normal. She exhibits no distension and no mass. There is no tenderness. There is no rebound and no guarding.  Musculoskeletal: She exhibits tenderness. She exhibits no edema.  Lymphadenopathy:    She has no cervical adenopathy.  Neurological: She displays normal reflexes. No cranial nerve deficit. She exhibits normal muscle tone. Coordination normal.  Skin: No rash noted. No erythema.  Psychiatric: She has a normal mood and affect. Her behavior is normal. Judgment and thought content normal.  eryth throat, swollen glands LS tender  Lab Results  Component Value Date   WBC 9.3 03/29/2015   HGB 10.8* 03/29/2015   HCT 32.5* 03/29/2015   PLT 502* 03/29/2015   GLUCOSE 134* 06/26/2015   CHOL 119 05/10/2014   TRIG 77.0 05/10/2014   HDL 54.30 05/10/2014   LDLCALC 49 05/10/2014   ALT 17 06/26/2015   AST 20 06/26/2015   NA 136 06/26/2015   K 5.1 06/26/2015   CL 95* 06/26/2015   CREATININE 0.97 06/26/2015   BUN 13 06/26/2015   CO2 25 06/26/2015   TSH 1.26 08/07/2010   INR 0.93 11/02/2013   HGBA1C 6.8* 03/29/2015   MICROALBUR 0.2 11/07/2006    Dg Epidurography  07/07/2015  CLINICAL DATA:  Atypical headache after right L5-S1 epidural injection in April 2016. EXAM: LUMBAR EPIDUROGRAM AND  BLOOD PATCH FLUOROSCOPY TIME:  18 seconds, 40 uGym2 DAP COMPARISON:  None available TECHNIQUE: The procedure, risks (including but not limited to bleeding, infection, organ damage ), benefits, and alternatives were explained to the patient. Questions regarding the procedure were encouraged and answered. The patient understands and consents to the procedure. Under fluoroscopy, An appropriate skin entry site was determined. Operator donned sterile gloves and mask. Skin site was marked, prepped with Betadine, and draped in usual sterile fashion, and infiltrated locally with 1% lidocaine. The 20 gauge Crawford needle was advanced into the lumbar posterior epidural space near the midline using a right interlaminar approach at L5-S1using loss of resistance technique. Diagnostic injection of 55ml Omnipaque-180 demonstrates good epidural spread  above and below the needle tip and crossing the midline. No intravascular or subarachnoid component. 11ml of autologous blood were then administered as lumbar epidural blood patch. No immediate complication. IMPRESSION: 1. Technically successful lumbar epidural blood patch under fluoroscopy. The patient was counseled on the importance of laying flat for an additional 24 hours and maintaining good p.o. fluid intake. Electronically Signed   By: Lucrezia Europe M.D.   On: 07/07/2015 12:48    Assessment & Plan:   Diagnoses and all orders for this visit:  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 heart failure  DM type 2, controlled, with complication ([place])  Anxiety state  Right-sided low back pain with right-sided sciatica  Neck pain  I am having Ms. Pickart maintain her b complex vitamins, (multivitamin,tx-minerals), metFORMIN, insulin glargine, cetirizine, DEPLIN, calcium-vitamin D, aspirin EC, augmented betamethasone dipropionate, lovastatin, Vilazodone HCl, estradiol, conjugated estrogens, BD PEN NEEDLE NANO U/F, ONETOUCH DELICA LANCETS 99991111, VOLTAREN, metroNIDAZOLE,  pioglitazone, Linaclotide, pantoprazole, VICTOZA, Testosterone, furosemide, LORazepam, ONETOUCH VERIO, dicyclomine, famotidine, traMADol-acetaminophen, and FLECTOR.  Meds ordered this encounter  Medications  . traMADol-acetaminophen (ULTRACET) 37.5-325 MG tablet    Sig: Take 1 tablet by mouth 3 (three) times daily as needed.  Marland Kitchen FLECTOR 1.3 % PTCH    Sig: Place 1 patch onto the skin daily.     Follow-up: No Follow-up on file.  Walker Kehr, MD

## 2015-10-17 NOTE — Assessment & Plan Note (Signed)
Mild Furosemide 

## 2015-10-17 NOTE — Assessment & Plan Note (Signed)
Better  

## 2015-10-17 NOTE — Assessment & Plan Note (Signed)
Victoza, Metformin, Lantus  

## 2015-10-17 NOTE — Progress Notes (Signed)
Pre visit review using our clinic review tool, if applicable. No additional management support is needed unless otherwise documented below in the visit note. 

## 2015-10-18 DIAGNOSIS — M5136 Other intervertebral disc degeneration, lumbar region: Secondary | ICD-10-CM | POA: Diagnosis not present

## 2015-10-23 ENCOUNTER — Telehealth: Payer: Self-pay | Admitting: Internal Medicine

## 2015-10-23 NOTE — Telephone Encounter (Signed)
I called pt- she states she has not had much improvement since her last OV with PCP. She just completed her zpak today. I advised her that the zpak continues working days after the last dose. I also advised her to increase her fluid intake, take it easy and give it a couple more day. If after then she has had no improvements I advised her to call us back because PCP will be out of the office x 1 week.

## 2015-10-23 NOTE — Telephone Encounter (Signed)
Pt was in last Monday and she has finished her zpack and she is still feeling bad, glands swollen and hard to talk. She would like for you to give her a call so she can talk with you regarding this. Best number is [phone]

## 2015-10-31 ENCOUNTER — Ambulatory Visit (INDEPENDENT_AMBULATORY_CARE_PROVIDER_SITE_OTHER): Payer: PPO | Admitting: Family Medicine

## 2015-10-31 VITALS — BP 140/78 | HR 89 | Temp 97.9°F | Resp 20 | Ht 63.0 in | Wt 156.2 lb

## 2015-10-31 DIAGNOSIS — J208 Acute bronchitis due to other specified organisms: Secondary | ICD-10-CM | POA: Diagnosis not present

## 2015-10-31 DIAGNOSIS — Z4789 Encounter for other orthopedic aftercare: Secondary | ICD-10-CM | POA: Diagnosis not present

## 2015-10-31 MED ORDER — PROMETHAZINE HCL 12.5 MG PO TABS: 12.5000 mg | ORAL_TABLET | Freq: Four times a day (QID) | ORAL | Status: DC | PRN

## 2015-10-31 MED ORDER — BENZONATATE 200 MG PO CAPS: 200.0000 mg | ORAL_CAPSULE | Freq: Three times a day (TID) | ORAL | Status: DC | PRN
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
## 2015-11-07 DIAGNOSIS — J301 Allergic rhinitis due to pollen: Secondary | ICD-10-CM | POA: Diagnosis not present

## 2015-11-07 DIAGNOSIS — E1165 Type 2 diabetes mellitus with hyperglycemia: Secondary | ICD-10-CM | POA: Diagnosis not present

## 2015-11-07 DIAGNOSIS — Z794 Long term (current) use of insulin: Secondary | ICD-10-CM | POA: Diagnosis not present

## 2015-11-07 DIAGNOSIS — J3089 Other allergic rhinitis: Secondary | ICD-10-CM | POA: Diagnosis not present

## 2015-11-07 DIAGNOSIS — E7800 Pure hypercholesterolemia, unspecified: Secondary | ICD-10-CM | POA: Diagnosis not present

## 2015-11-21 ENCOUNTER — Encounter: Payer: Self-pay | Admitting: Neurology

## 2015-11-23 DIAGNOSIS — M25561 Pain in right knee: Secondary | ICD-10-CM | POA: Diagnosis not present

## 2015-11-23 DIAGNOSIS — J301 Allergic rhinitis due to pollen: Secondary | ICD-10-CM | POA: Diagnosis not present

## 2015-11-23 DIAGNOSIS — J3089 Other allergic rhinitis: Secondary | ICD-10-CM | POA: Diagnosis not present

## 2015-11-24 DIAGNOSIS — F411 Generalized anxiety disorder: Secondary | ICD-10-CM | POA: Diagnosis not present

## 2015-11-24 DIAGNOSIS — F331 Major depressive disorder, recurrent, moderate: Secondary | ICD-10-CM | POA: Diagnosis not present

## 2015-11-27 ENCOUNTER — Ambulatory Visit (AMBULATORY_SURGERY_CENTER): Payer: Self-pay

## 2015-11-27 VITALS — Ht 62.5 in | Wt 153.8 lb

## 2015-11-27 DIAGNOSIS — Z860100 Personal history of colon polyps, unspecified: Secondary | ICD-10-CM

## 2015-11-27 MED ORDER — SUPREP BOWEL PREP KIT 17.5-3.13-1.6 GM/177ML PO SOLN: 1.0000 | Freq: Once | ORAL | Status: DC

## 2015-12-05 ENCOUNTER — Encounter: Payer: Self-pay | Admitting: Gastroenterology

## 2015-12-05 ENCOUNTER — Ambulatory Visit (AMBULATORY_SURGERY_CENTER): Payer: PPO | Admitting: Gastroenterology

## 2015-12-05 VITALS — BP 148/69 | HR 87 | Temp 96.9°F | Resp 27 | Ht 62.5 in | Wt 153.0 lb

## 2015-12-05 DIAGNOSIS — E119 Type 2 diabetes mellitus without complications: Secondary | ICD-10-CM | POA: Diagnosis not present

## 2015-12-05 DIAGNOSIS — K219 Gastro-esophageal reflux disease without esophagitis: Secondary | ICD-10-CM

## 2015-12-05 DIAGNOSIS — Z86010 Personal history of colonic polyps: Secondary | ICD-10-CM | POA: Diagnosis not present

## 2015-12-05 DIAGNOSIS — E669 Obesity, unspecified: Secondary | ICD-10-CM | POA: Diagnosis not present

## 2015-12-05 DIAGNOSIS — G4733 Obstructive sleep apnea (adult) (pediatric): Secondary | ICD-10-CM | POA: Diagnosis not present

## 2015-12-05 DIAGNOSIS — I10 Essential (primary) hypertension: Secondary | ICD-10-CM | POA: Diagnosis not present

## 2015-12-05 LAB — GLUCOSE, CAPILLARY
GLUCOSE-CAPILLARY: 111 mg/dL — AB (ref 65–99)
GLUCOSE-CAPILLARY: 79 mg/dL (ref 65–99)
GLUCOSE-CAPILLARY: 88 mg/dL (ref 65–99)

## 2015-12-05 MED ORDER — SODIUM CHLORIDE 0.9 % IV SOLN: 500.0000 mL | INTRAVENOUS | Status: DC

## 2015-12-05 MED ORDER — PANTOPRAZOLE SODIUM 40 MG PO TBEC: 40.0000 mg | DELAYED_RELEASE_TABLET | Freq: Two times a day (BID) | ORAL | Status: DC

## 2015-12-05 NOTE — Progress Notes (Signed)
Report to PACU, RN, vss, BBS= Clear.  

## 2015-12-05 NOTE — Progress Notes (Signed)
No problems noted in the recovery room. maw 

## 2015-12-05 NOTE — Patient Instructions (Signed)
YOU HAD AN ENDOSCOPIC PROCEDURE TODAY AT [facility]:   Refer to the procedure report that was given to you for any specific questions about what was found during the examination.  If the procedure report does not answer your questions, please call your gastroenterologist to clarify.  If you requested that your care partner not be given the details of your procedure findings, then the procedure report has been included in a sealed envelope for you to review at your convenience later.  YOU SHOULD EXPECT: Some feelings of bloating in the abdomen. Passage of more gas than usual.  Walking can help get rid of the air that was put into your GI tract during the procedure and reduce the bloating. If you had a lower endoscopy (such as a colonoscopy or flexible sigmoidoscopy) you may notice spotting of blood in your stool or on the toilet paper. If you underwent a bowel prep for your procedure, you may not have a normal bowel movement for a few days.  Please Note:  You might notice some irritation and congestion in your nose or some drainage.  This is from the oxygen used during your procedure.  There is no need for concern and it should clear up in a day or so.  SYMPTOMS TO REPORT IMMEDIATELY:   Following lower endoscopy (colonoscopy or flexible sigmoidoscopy):  Excessive amounts of blood in the stool  Significant tenderness or worsening of abdominal pains  Swelling of the abdomen that is new, acute  Fever of 100F or higher   For urgent or emergent issues, a gastroenterologist can be reached at any hour by calling [phone].   DIET: Your first meal following the procedure should be a small meal and then it is ok to progress to your normal diet. Heavy or fried foods are harder to digest and may make you feel nauseous or bloated.  Likewise, meals heavy in dairy and vegetables can increase bloating.  Drink plenty of fluids but you should avoid alcoholic beverages for 24  hours.  ACTIVITY:  You should plan to take it easy for the rest of today and you should NOT DRIVE or use heavy machinery until tomorrow (because of the sedation medicines used during the test).    FOLLOW UP: Our staff will call the number listed on your records the next business day following your procedure to check on you and address any questions or concerns that you may have regarding the information given to you following your procedure. If we do not reach you, we will leave a message.  However, if you are feeling well and you are not experiencing any problems, there is no need to return our call.  We will assume that you have returned to your regular daily activities without incident.  If any biopsies were taken you will be contacted by phone or by letter within the next 1-3 weeks.  Please call us at [phone] if you have not heard about the biopsies in 3 weeks.    SIGNATURES/CONFIDENTIALITY: You and/or your care partner have signed paperwork which will be entered into your electronic medical record.  These signatures attest to the fact that that the information above on your After Visit Summary has been reviewed and is understood.  Full responsibility of the confidentiality of this discharge information lies with you and/or your care-partner.    Per Dr. Fuller Plan take PANTOPRAZOLE 40 mg 2 x daily.  Discontinue taking FAMOTADINE at bed time. Your blood sugar was 79  on admission to the recovery room. You may resume your current medications today. Please call if any questions or concerns.

## 2015-12-05 NOTE — Op Note (Signed)
[facility] Patient Name: [name] Procedure Date: 12/05/2015 9:38 AM MRN: [id number] Endoscopist: Ladene Artist MD, MD Age: 75 Date of Birth: 06-Dec-1940 Gender: Female Procedure:                Colonoscopy Indications:              Surveillance: Personal history of adenomatous                            polyps on last colonoscopy > 3 years ago Medicines:                Monitored Anesthesia Care Procedure:                Pre-Anesthesia Assessment:                           - Prior to the procedure, a History and Physical                            was performed, and patient medications and                            allergies were reviewed. The patient's tolerance of                            previous anesthesia was also reviewed. The risks                            and benefits of the procedure and the sedation                            options and risks were discussed with the patient.                            All questions were answered, and informed consent                            was obtained. Prior Anticoagulants: The patient has                            taken no previous anticoagulant or antiplatelet                            agents. ASA Grade Assessment: III - A patient with                            severe systemic disease. After reviewing the risks                            and benefits, the patient was deemed in                            satisfactory condition to undergo the procedure.  After obtaining informed consent, the colonoscope                            was passed under direct vision. Throughout the                            procedure, the patient's blood pressure, pulse, and                            oxygen saturations were monitored continuously. The                            Model PCF-H190L [id number]) scope was introduced                            through the anus and advanced to the the cecum,                       identified by appendiceal orifice and ileocecal                            valve. The colonoscopy was performed without                            difficulty. The patient tolerated the procedure                            well. The quality of the bowel preparation was                            excellent. The ileocecal valve, appendiceal                            orifice, and rectum were photographed. Scope In: 9:47:42 AM Scope Out: 10:01:44 AM Scope Withdrawal Time: 0 hours 10 minutes 29 seconds  Total Procedure Duration: 0 hours 14 minutes 2 seconds  Findings:                 The digital rectal exam was normal.                           The colon (entire examined portion) appeared normal.                           The retroflexed view of the distal rectum and anal                            verge was normal and showed no anal or rectal                            abnormalities. Complications:            No immediate complications. Estimated Blood Loss:     Estimated blood loss: none. Impression:               - The entire examined colon is normal.  Recommendation:           - Patient has a contact number available for                            emergencies. The signs and symptoms of potential                            delayed complications were discussed with the                            patient. Return to normal activities tomorrow.                            Written discharge instructions were provided to the                            patient.                           - Resume previous diet.                           - Continue present medications.                           - Repeat colonoscopy in 5 years for surveillance. Ladene Artist MD, MD 12/05/2015 10:05:45 AM This report has been signed electronically.

## 2015-12-06 ENCOUNTER — Telehealth: Payer: Self-pay | Admitting: *Deleted

## 2015-12-06 NOTE — Telephone Encounter (Signed)
  Follow up Call-  Call back number 12/05/2015 06/22/2013  Post procedure Call Back phone  # [phone]  Permission to leave phone message Yes Yes     Patient questions:  Do you have a fever, pain , or abdominal swelling? No. Pain Score  0 *  Have you tolerated food without any problems? Yes.    Have you been able to return to your normal activities? Yes.    Do you have any questions about your discharge instructions: Diet   No. Medications  No. Follow up visit  No.  Do you have questions or concerns about your Care? No.  Actions: * If pain score is 4 or above: No action needed, pain <4.

## 2015-12-07 ENCOUNTER — Ambulatory Visit (INDEPENDENT_AMBULATORY_CARE_PROVIDER_SITE_OTHER): Payer: PPO | Admitting: Neurology

## 2015-12-07 DIAGNOSIS — J301 Allergic rhinitis due to pollen: Secondary | ICD-10-CM | POA: Diagnosis not present

## 2015-12-07 DIAGNOSIS — G4733 Obstructive sleep apnea (adult) (pediatric): Secondary | ICD-10-CM

## 2015-12-07 DIAGNOSIS — J3089 Other allergic rhinitis: Secondary | ICD-10-CM | POA: Diagnosis not present

## 2015-12-07 DIAGNOSIS — G4720 Circadian rhythm sleep disorder, unspecified type: Secondary | ICD-10-CM

## 2015-12-08 NOTE — Sleep Study (Signed)
Please see the scanned sleep study interpretation located in the procedure tab in the chart view section.  

## 2015-12-15 ENCOUNTER — Telehealth: Payer: Self-pay | Admitting: Neurology

## 2015-12-15 DIAGNOSIS — G4733 Obstructive sleep apnea (adult) (pediatric): Secondary | ICD-10-CM

## 2015-12-15 NOTE — Telephone Encounter (Signed)
Patient referred by Dr. Jaynee Eagles, seen by me on 08/29/15 and then on 10/10/15, diagnostic PSG on 09/21/15, cpap study on 12/07/15, ins: MCR/[employer].  Please call and inform patient that I have entered an order for treatment with positive airway pressure (PAP) treatment of obstructive sleep apnea (OSA). She did well during the latest sleep study with CPAP. We will, therefore, arrange for a machine for home use through a DME (durable medical equipment) company of Her choice; and I will see the patient back in follow-up in about 8-10 weeks. Please also explain to the patient that I will be looking out for compliance data, which can be downloaded from the machine (stored on an SD card, that is inserted in the machine) or via remote access through a modem, that is built into the machine. At the time of the followup appointment we will discuss sleep study results and how it is going with PAP treatment at home. Please advise patient to bring Her machine at the time of the first FU visit, even though this is cumbersome. Bringing the machine for every visit after that will likely not be needed, but often helps for the first visit to troubleshoot if needed. Please re-enforce the importance of compliance with treatment and the need for [place] to monitor compliance data - often an insurance requirement and actually good feedback for the patient as far as how they are doing.  Also remind patient, that any interim PAP machine or mask issues should be first addressed with the DME company, as they can often help better with technical and mask fit issues. Please ask if patient has a preference regarding DME company.  Please also make sure, the patient has a follow-up appointment with me in about 8-10 weeks from the setup date, thanks.  Once you have spoken to the patient - and faxed/routed report to PCP and referring MD (if other than PCP), you can close this encounter, thanks,   Star Age, MD, PhD [facility]  ([place])

## 2015-12-18 NOTE — Telephone Encounter (Signed)
LM for patient to call back.

## 2015-12-21 NOTE — Telephone Encounter (Signed)
LM vm on home phone to call back. LM on cell phone.

## 2015-12-25 ENCOUNTER — Other Ambulatory Visit (HOSPITAL_COMMUNITY): Payer: Self-pay | Admitting: Orthopedic Surgery

## 2015-12-25 DIAGNOSIS — M25561 Pain in right knee: Secondary | ICD-10-CM

## 2015-12-25 NOTE — Telephone Encounter (Signed)
I spoke to patient and she is aware of results and recommendations. She is willing to try CPAP. I will fax order to [facility]. I advised her to call us back or [facility] back if she has any trouble. Also if she has trouble using or getting use to CPAP, she can speak with Shirlean Mylar the Sleep Lab manager. I will send copy of report to PCP. Patient scheduled f/u appt.

## 2015-12-25 NOTE — Telephone Encounter (Signed)
Patient returned Diana's call

## 2015-12-29 DIAGNOSIS — J301 Allergic rhinitis due to pollen: Secondary | ICD-10-CM | POA: Diagnosis not present

## 2015-12-29 DIAGNOSIS — J3089 Other allergic rhinitis: Secondary | ICD-10-CM | POA: Diagnosis not present

## 2016-01-03 ENCOUNTER — Encounter (HOSPITAL_COMMUNITY)
Admission: RE | Admit: 2016-01-03 | Discharge: 2016-01-03 | Disposition: A | Discharge status: Home / Self Care | Payer: PPO | Source: Ambulatory Visit | Attending: Orthopedic Surgery | Admitting: Orthopedic Surgery

## 2016-01-03 ENCOUNTER — Ambulatory Visit (HOSPITAL_COMMUNITY)
Admission: RE | Admit: 2016-01-03 | Discharge: 2016-01-03 | Disposition: A | Discharge status: Home / Self Care | Payer: PPO | Source: Ambulatory Visit | Attending: Orthopedic Surgery | Admitting: Orthopedic Surgery

## 2016-01-03 DIAGNOSIS — Z96651 Presence of right artificial knee joint: Secondary | ICD-10-CM | POA: Diagnosis not present

## 2016-01-03 DIAGNOSIS — M25561 Pain in right knee: Secondary | ICD-10-CM | POA: Diagnosis not present

## 2016-01-03 DIAGNOSIS — Z471 Aftercare following joint replacement surgery: Secondary | ICD-10-CM | POA: Diagnosis not present

## 2016-01-03 MED ORDER — TECHNETIUM TC 99M MEDRONATE IV KIT
25.0000 | PACK | Freq: Once | INTRAVENOUS | Status: AC | PRN
  Administered 2016-01-03: 26.1 via INTRAVENOUS

## 2016-01-04 DIAGNOSIS — J301 Allergic rhinitis due to pollen: Secondary | ICD-10-CM | POA: Diagnosis not present

## 2016-01-04 DIAGNOSIS — J3089 Other allergic rhinitis: Secondary | ICD-10-CM | POA: Diagnosis not present

## 2016-01-16 ENCOUNTER — Ambulatory Visit: Payer: PPO | Admitting: Internal Medicine

## 2016-01-18 ENCOUNTER — Other Ambulatory Visit: Payer: Self-pay | Admitting: Internal Medicine

## 2016-01-24 ENCOUNTER — Ambulatory Visit: Payer: PPO | Admitting: Internal Medicine

## 2016-01-29 DIAGNOSIS — G4733 Obstructive sleep apnea (adult) (pediatric): Secondary | ICD-10-CM | POA: Diagnosis not present

## 2016-01-30 ENCOUNTER — Ambulatory Visit (INDEPENDENT_AMBULATORY_CARE_PROVIDER_SITE_OTHER): Payer: PPO | Admitting: Internal Medicine

## 2016-01-30 ENCOUNTER — Encounter: Payer: Self-pay | Admitting: Internal Medicine

## 2016-01-30 VITALS — BP 138/90 | HR 98 | Wt 151.0 lb

## 2016-01-30 DIAGNOSIS — M5440 Lumbago with sciatica, unspecified side: Secondary | ICD-10-CM

## 2016-01-30 DIAGNOSIS — G4733 Obstructive sleep apnea (adult) (pediatric): Secondary | ICD-10-CM | POA: Diagnosis not present

## 2016-01-30 DIAGNOSIS — Z9989 Dependence on other enabling machines and devices: Secondary | ICD-10-CM

## 2016-01-30 DIAGNOSIS — J3089 Other allergic rhinitis: Secondary | ICD-10-CM | POA: Diagnosis not present

## 2016-01-30 DIAGNOSIS — E118 Type 2 diabetes mellitus with unspecified complications: Secondary | ICD-10-CM | POA: Diagnosis not present

## 2016-01-30 DIAGNOSIS — H6123 Impacted cerumen, bilateral: Secondary | ICD-10-CM | POA: Diagnosis not present

## 2016-01-30 DIAGNOSIS — J301 Allergic rhinitis due to pollen: Secondary | ICD-10-CM | POA: Diagnosis not present

## 2016-01-30 MED ORDER — TRAMADOL-ACETAMINOPHEN 37.5-325 MG PO TABS: 1.0000 | ORAL_TABLET | Freq: Three times a day (TID) | ORAL | Status: DC | PRN

## 2016-01-30 NOTE — Assessment & Plan Note (Signed)
Victoza, Metformin, Lantus  

## 2016-01-30 NOTE — Progress Notes (Signed)
Subjective:  Patient ID: [name], female    DOB: 08-24-1941  Age: 75 y.o. MRN: [id number]  CC: No chief complaint on file.   HPI [name] presents for ear dyscomfort. F/u LBP, DM2, depression.  Outpatient Prescriptions Prior to Visit  Medication Sig Dispense Refill  . aspirin EC 81 MG tablet Take 1 tablet (81 mg total) by mouth daily. 100 tablet 3  . b complex vitamins tablet Take 2 tablets by mouth daily. Vitamin supplement    . BD PEN NEEDLE NANO U/F 32G X 4 MM MISC USE AS DIRECTED TWICE DAILY.  3  . calcium-vitamin D (OSCAL WITH D) 500-200 MG-UNIT per tablet Take 1 tablet by mouth daily with breakfast.    . cetirizine (ZYRTEC) 10 MG tablet Take 10 mg by mouth daily as needed for allergies.     Marland Kitchen conjugated estrogens (PREMARIN) vaginal cream Place 1 Applicatorful vaginally 2 (two) times a week. 42.5 g 4  . dicyclomine (BENTYL) 10 MG capsule TAKE 1 CAPSULE (10 MG TOTAL) BY MOUTH 3 (THREE) TIMES DAILY BEFORE MEALS. 270 capsule 1  . estradiol (CLIMARA - DOSED IN MG/24 HR) 0.0375 mg/24hr patch APPLY 1 PATCH ONTO SKIN ONCE WEEKLY 12 patch 4  . FLECTOR 1.3 % PTCH Place 1 patch onto the skin daily.    . furosemide (LASIX) 20 MG tablet TAKE 1 TO 2 TABLETS BY MOUTH EVERY DAY 60 tablet 5  . insulin glargine (LANTUS) 100 UNIT/ML injection Inject 10 Units into the skin every evening.     Marland Kitchen L-Methylfolate (DEPLIN) 15 MG TABS Take 15 mg by mouth daily. For treatment of depression    . Linaclotide (LINZESS) 145 MCG CAPS capsule Take 1 capsule (145 mcg total) by mouth daily. 30 capsule 11  . LORazepam (ATIVAN) 1 MG tablet Take 1 mg by mouth 4 (four) times daily as needed.  2  . lovastatin (MEVACOR) 20 MG tablet TAKE 1 TABLET BY MOUTH EVERY DAY 90 tablet 3  . metFORMIN (GLUCOPHAGE-XR) 500 MG 24 hr tablet Take 1,000 mg by mouth 2 (two) times daily. For DM type 2    . metroNIDAZOLE (METROCREAM) 0.75 % cream     . Multiple Vitamins-Minerals (MULTIVITAMIN,TX-MINERALS) tablet Take 1 tablet by  mouth daily.     Glory Rosebush DELICA LANCETS 99991111 MISC USE UTD UP TO QID  1  . ONETOUCH VERIO test strip     . pantoprazole (PROTONIX) 40 MG tablet Take 1 tab in the morning before  Breakfast. 30 tablet 11  . pioglitazone (ACTOS) 45 MG tablet Take 45 mg by mouth daily.  3  . Testosterone POWD APPLY A TINY PEA-SIZED AMOUNT ONCE A DAY. 30 g 5  . traMADol-acetaminophen (ULTRACET) 37.5-325 MG tablet Take 1 tablet by mouth at bedtime.     Marland Kitchen VICTOZA 18 MG/3ML SOPN ADM 1.8 MG [place] QD  0  . Vilazodone HCl (VIIBRYD) 40 MG TABS Take 40 mg by mouth daily.     . VOLTAREN 1 % GEL APPLY 2 GRAMS TO KNEE 2-3 TIMES DAILY PRN  1  . pantoprazole (PROTONIX) 40 MG tablet Take 1 tablet (40 mg total) by mouth 2 (two) times daily. (Patient not taking: Reported on 01/30/2016) 180 tablet 3   No facility-administered medications prior to visit.    ROS Review of Systems  Constitutional: Positive for fatigue. Negative for chills, activity change, appetite change and unexpected weight change.  HENT: Positive for ear discharge. Negative for congestion, mouth sores and sinus pressure.  Eyes: Negative for visual disturbance.  Respiratory: Negative for cough and chest tightness.   Gastrointestinal: Negative for nausea and abdominal pain.  Genitourinary: Negative for frequency, difficulty urinating and vaginal pain.  Musculoskeletal: Positive for back pain and gait problem.  Skin: Negative for pallor and rash.  Neurological: Negative for dizziness, tremors, weakness, numbness and headaches.  Psychiatric/Behavioral: Negative for suicidal ideas, confusion and sleep disturbance.    Objective:  BP 138/90 mmHg  Pulse 98  Wt 151 lb (68.493 kg)  SpO2 97%  BP Readings from Last 3 Encounters:  01/30/16 138/90  12/05/15 148/69  10/31/15 140/78    Wt Readings from Last 3 Encounters:  01/30/16 151 lb (68.493 kg)  12/05/15 153 lb (69.4 kg)  11/27/15 153 lb 12.8 oz (69.763 kg)    Physical Exam  Constitutional: She appears  well-developed. No distress.  HENT:  Head: Normocephalic.  Right Ear: External ear normal.  Left Ear: External ear normal.  Nose: Nose normal.  Mouth/Throat: Oropharynx is clear and moist.  Eyes: Conjunctivae are normal. Pupils are equal, round, and reactive to light. Right eye exhibits no discharge. Left eye exhibits no discharge.  Neck: Normal range of motion. Neck supple. No JVD present. No tracheal deviation present. No thyromegaly present.  Cardiovascular: Normal rate, regular rhythm and normal heart sounds.   Pulmonary/Chest: No stridor. No respiratory distress. She has no wheezes.  Abdominal: Soft. Bowel sounds are normal. She exhibits no distension and no mass. There is no tenderness. There is no rebound and no guarding.  Musculoskeletal: She exhibits tenderness. She exhibits no edema.  Lymphadenopathy:    She has no cervical adenopathy.  Neurological: She displays normal reflexes. No cranial nerve deficit. She exhibits normal muscle tone. Coordination normal.  Skin: No rash noted. No erythema.  Psychiatric: She has a normal mood and affect. Her behavior is normal. Judgment and thought content normal.  LS tender B wax   Procedure Note :     Procedure :  Ear irrigation   Indication:  Cerumen impaction B   Risks, including pain, dizziness, eardrum perforation, bleeding, infection and others as well as benefits were explained to the patient in detail. Verbal consent was obtained and the patient agreed to proceed.    We used "The Elephant Ear Irrigation Device" filled with lukewarm water for irrigation. A large amount wax was recovered. Procedure has also required manual wax removal with an ear loop.   Tolerated well. Complications: None.   Postprocedure instructions :  Call if problems.   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 EXAM: NUCLEAR MEDICINE 3-PHASE BONE SCAN TECHNIQUE: Radionuclide angiographic images, immediate static blood pool images, and 3-hour delayed static images were obtained of the knees after intravenous injection of  radiopharmaceutical. RADIOPHARMACEUTICALS:  26.1 mCi Tc-3m MDP COMPARISON:  None. FINDINGS: Vascular phase: No abnormal asymmetric increased uptake identified. Blood pool phase: No abnormal asymmetric increased uptake identified. Photopenia corresponds to the patient's right knee arthroplasty device. Delayed phase: Mild nonspecific increased uptake surrounding the tibial components of the right knee arthroplasty device identified. IMPRESSION: 1. No specific findings identified to suggest arthroplasty device loosening or infection. 2. Nonspecific delayed phase uptake surrounds the tibial component of the right knee arthroplasty device. Electronically Signed   By: Kerby Moors M.D.   On: 01/03/2016 16:03    Assessment & Plan:   There are no diagnoses linked to this encounter. I am having Ms. Niehaus maintain her b complex vitamins, (multivitamin,tx-minerals), metFORMIN, insulin glargine, cetirizine, DEPLIN, calcium-vitamin D, aspirin EC, lovastatin, Vilazodone HCl, estradiol, conjugated estrogens, BD PEN NEEDLE NANO U/F, ONETOUCH DELICA LANCETS 99991111, VOLTAREN, metroNIDAZOLE, pioglitazone, linaclotide, pantoprazole, VICTOZA, Testosterone, LORazepam, ONETOUCH VERIO, dicyclomine, traMADol-acetaminophen, FLECTOR, furosemide, busPIRone,  and famotidine.  Meds ordered this encounter  Medications  . busPIRone (BUSPAR) 15 MG tablet    Sig: Take 1 tablet by mouth 2 (two) times daily.  . famotidine (PEPCID) 40 MG tablet    Sig: Take 1 tablet by mouth daily.     Follow-up: No Follow-up on file.  Walker Kehr, MD

## 2016-01-30 NOTE — Assessment & Plan Note (Signed)
Will irrigate 

## 2016-01-30 NOTE — Progress Notes (Signed)
Pre visit review using our clinic review tool, if applicable. No additional management support is needed unless otherwise documented below in the visit note. 

## 2016-01-30 NOTE — Assessment & Plan Note (Signed)
Starting on CPAP

## 2016-01-30 NOTE — Assessment & Plan Note (Addendum)
Prop w/pillows at night Ultracet prn  Potential benefits of a long term Tramadol/opioids use as well as potential risks (i.e. addiction risk, apnea etc) and complications (i.e. Somnolence, constipation and others) were explained to the patient and were aknowledged.

## 2016-02-01 ENCOUNTER — Telehealth: Payer: Self-pay | Admitting: *Deleted

## 2016-02-01 DIAGNOSIS — F411 Generalized anxiety disorder: Secondary | ICD-10-CM | POA: Diagnosis not present

## 2016-02-01 DIAGNOSIS — F3342 Major depressive disorder, recurrent, in full remission: Secondary | ICD-10-CM | POA: Diagnosis not present

## 2016-02-01 NOTE — Telephone Encounter (Signed)
PA done online for climara patch 0.0375 mg , will wait for response.

## 2016-02-02 DIAGNOSIS — J3089 Other allergic rhinitis: Secondary | ICD-10-CM | POA: Diagnosis not present

## 2016-02-02 DIAGNOSIS — J301 Allergic rhinitis due to pollen: Secondary | ICD-10-CM | POA: Diagnosis not present

## 2016-02-02 NOTE — Telephone Encounter (Signed)
Patch approved from 02/02/16 until 08/25/16

## 2016-02-05 ENCOUNTER — Telehealth: Payer: Self-pay | Admitting: *Deleted

## 2016-02-05 ENCOUNTER — Encounter (HOSPITAL_COMMUNITY): Payer: Self-pay | Admitting: Emergency Medicine

## 2016-02-05 ENCOUNTER — Telehealth: Payer: Self-pay | Admitting: Internal Medicine

## 2016-02-05 ENCOUNTER — Emergency Department (HOSPITAL_COMMUNITY)
Admission: EM | Admit: 2016-02-05 | Discharge: 2016-02-05 | Disposition: A | Discharge status: Home / Self Care | Payer: PPO | Attending: Emergency Medicine | Admitting: Emergency Medicine

## 2016-02-05 DIAGNOSIS — F329 Major depressive disorder, single episode, unspecified: Secondary | ICD-10-CM | POA: Insufficient documentation

## 2016-02-05 DIAGNOSIS — E785 Hyperlipidemia, unspecified: Secondary | ICD-10-CM | POA: Diagnosis not present

## 2016-02-05 DIAGNOSIS — Z794 Long term (current) use of insulin: Secondary | ICD-10-CM | POA: Insufficient documentation

## 2016-02-05 DIAGNOSIS — H81399 Other peripheral vertigo, unspecified ear: Secondary | ICD-10-CM | POA: Insufficient documentation

## 2016-02-05 DIAGNOSIS — Z7984 Long term (current) use of oral hypoglycemic drugs: Secondary | ICD-10-CM | POA: Diagnosis not present

## 2016-02-05 DIAGNOSIS — M1990 Unspecified osteoarthritis, unspecified site: Secondary | ICD-10-CM | POA: Diagnosis not present

## 2016-02-05 DIAGNOSIS — Z86010 Personal history of colonic polyps: Secondary | ICD-10-CM | POA: Insufficient documentation

## 2016-02-05 DIAGNOSIS — Z7982 Long term (current) use of aspirin: Secondary | ICD-10-CM | POA: Insufficient documentation

## 2016-02-05 DIAGNOSIS — G43909 Migraine, unspecified, not intractable, without status migrainosus: Secondary | ICD-10-CM | POA: Insufficient documentation

## 2016-02-05 DIAGNOSIS — E119 Type 2 diabetes mellitus without complications: Secondary | ICD-10-CM | POA: Diagnosis not present

## 2016-02-05 LAB — URINALYSIS, ROUTINE W REFLEX MICROSCOPIC
BILIRUBIN URINE: NEGATIVE
Glucose, UA: NEGATIVE mg/dL
Hgb urine dipstick: NEGATIVE
Ketones, ur: NEGATIVE mg/dL
Leukocytes, UA: NEGATIVE
NITRITE: NEGATIVE
PROTEIN: NEGATIVE mg/dL
SPECIFIC GRAVITY, URINE: 1.013 (ref 1.005–1.030)
pH: 7 (ref 5.0–8.0)

## 2016-02-05 LAB — BASIC METABOLIC PANEL
ANION GAP: 7 (ref 5–15)
BUN: 15 mg/dL (ref 6–20)
CALCIUM: 9.4 mg/dL (ref 8.9–10.3)
CO2: 29 mmol/L (ref 22–32)
Chloride: 100 mmol/L — ABNORMAL LOW (ref 101–111)
Creatinine, Ser: 0.94 mg/dL (ref 0.44–1.00)
GFR, EST NON AFRICAN AMERICAN: 58 mL/min — AB (ref >60)
GLUCOSE: 98 mg/dL (ref 65–99)
Potassium: 4.1 mmol/L (ref 3.5–5.1)
Sodium: 136 mmol/L (ref 135–145)

## 2016-02-05 LAB — CBC
HCT: 28.8 % — ABNORMAL LOW (ref 36.0–46.0)
HEMOGLOBIN: 9.8 g/dL — AB (ref 12.0–15.0)
MCH: 29.7 pg (ref 26.0–34.0)
MCHC: 34 g/dL (ref 30.0–36.0)
MCV: 87.3 fL (ref 78.0–100.0)
PLATELETS: 515 10*3/uL — AB (ref 150–400)
RBC: 3.3 MIL/uL — AB (ref 3.87–5.11)
RDW: 15.3 % (ref 11.5–15.5)
WBC: 8.3 10*3/uL (ref 4.0–10.5)

## 2016-02-05 LAB — CBG MONITORING, ED
GLUCOSE-CAPILLARY: 94 mg/dL (ref 65–99)
GLUCOSE-CAPILLARY: 107 mg/dL — AB (ref 65–99)

## 2016-02-05 MED ORDER — MECLIZINE HCL 25 MG PO TABS
25.0000 mg | ORAL_TABLET | Freq: Once | ORAL | Status: AC
  Administered 2016-02-05: 25 mg via ORAL
  Filled 2016-02-05: qty 1

## 2016-02-05 NOTE — ED Notes (Signed)
Pt states that since yesterday she has had dizziness that worsens when she gets up or moves around. Hx of vertigo in the past. Feels the same. Alert and oriented. Neuro intact otherwise.

## 2016-02-05 NOTE — Discharge Instructions (Signed)
Look up Richrd Sox, MD Vertigo Treament @ www.[email or web address]   You can also take Meclizine 25 mg up to 3/day    Benign Positional Vertigo Vertigo is the feeling that you or your surroundings are moving when they are not. Benign positional vertigo is the most common form of vertigo. The cause of this condition is not serious (is benign). This condition is triggered by certain movements and positions (is positional). This condition can be dangerous if it occurs while you are doing something that could endanger you or others, such as driving.  CAUSES In many cases, the cause of this condition is not known. It may be caused by a disturbance in an area of the inner ear that helps your brain to sense movement and balance. This disturbance can be caused by a viral infection (labyrinthitis), head injury, or repetitive motion. RISK FACTORS This condition is more likely to develop in:  Women.  People who are 23 years of age or older. SYMPTOMS Symptoms of this condition usually happen when you move your head or your eyes in different directions. Symptoms may start suddenly, and they usually last for less than a minute. Symptoms may include:  Loss of balance and falling.  Feeling like you are spinning or moving.  Feeling like your surroundings are spinning or moving.  Nausea and vomiting.  Blurred vision.  Dizziness.  Involuntary eye movement (nystagmus). Symptoms can be mild and cause only slight annoyance, or they can be severe and interfere with daily life. Episodes of benign positional vertigo may return (recur) over time, and they may be triggered by certain movements. Symptoms may improve over time. DIAGNOSIS This condition is usually diagnosed by medical history and a physical exam of the head, neck, and ears. You may be referred to a health care provider who specializes in ear, nose, and throat (ENT) problems (otolaryngologist) or a provider who specializes in disorders of the nervous  system (neurologist). You may have additional testing, including:  MRI.  A CT scan.  Eye movement tests. Your health care provider may ask you to change positions quickly while he or she watches you for symptoms of benign positional vertigo, such as nystagmus. Eye movement may be tested with an electronystagmogram (ENG), caloric stimulation, the Dix-Hallpike test, or the roll test.  An electroencephalogram (EEG). This records electrical activity in your brain.  Hearing tests. TREATMENT Usually, your health care provider will treat this by moving your head in specific positions to adjust your inner ear back to normal. Surgery may be needed in severe cases, but this is rare. In some cases, benign positional vertigo may resolve on its own in 2-4 weeks. HOME CARE INSTRUCTIONS Safety  Move slowly.Avoid sudden body or head movements.  Avoid driving.  Avoid operating heavy machinery.  Avoid doing any tasks that would be dangerous to you or others if a vertigo episode would occur.  If you have trouble walking or keeping your balance, try using a cane for stability. If you feel dizzy or unstable, sit down right away.  Return to your normal activities as told by your health care provider. Ask your health care provider what activities are safe for you. General Instructions  Take over-the-counter and prescription medicines only as told by your health care provider.  Avoid certain positions or movements as told by your health care provider.  Drink enough fluid to keep your urine clear or pale yellow.  Keep all follow-up visits as told by your health care provider. This is important.  SEEK MEDICAL CARE IF:  You have a fever.  Your condition gets worse or you develop new symptoms.  Your family or friends notice any behavioral changes.  Your nausea or vomiting gets worse.  You have numbness or a "pins and needles" sensation. SEEK IMMEDIATE MEDICAL CARE IF:  You have difficulty  speaking or moving.  You are always dizzy.  You faint.  You develop severe headaches.  You have weakness in your legs or arms.  You have changes in your hearing or vision.  You develop a stiff neck.  You develop sensitivity to light.   This information is not intended to replace advice given to you by your health care provider. Make sure you discuss any questions you have with your health care provider.   Document Released: 05/20/2006 Document Revised: 05/03/2015 Document Reviewed: 12/05/2014 Elsevier Interactive Patient Education [employer].

## 2016-02-05 NOTE — Telephone Encounter (Signed)
Pt sleep study, faxed over to [facility] on 02/05/16.

## 2016-02-05 NOTE — Telephone Encounter (Signed)
[place] Call [facility]  Patient Name: [name]  DOB: 02-19-1941    Initial Comment Caller has vertigo/dizziness.    Nurse Assessment  Nurse: Harlow Mares, RN, Suanne Marker Date/Time (Eastern Time): 02/05/2016 4:29:20 PM  Confirm and document reason for call. If symptomatic, describe symptoms. You must click the next button to save text entered. ---Caller has vertigo/dizziness. Awoke this morning with these symptoms. Husband has to help her ambulate.  Has the patient traveled out of the country within the last 30 days? ---Not Applicable  Does the patient have any new or worsening symptoms? ---Yes  Will a triage be completed? ---Yes  Related visit to physician within the last 2 weeks? ---Yes  Does the PT have any chronic conditions? (i.e. diabetes, asthma, etc.) ---Yes  List chronic conditions. ---diabetes;  Is this a behavioral health or substance abuse call? ---No     Guidelines    Guideline Title Affirmed Question Affirmed Notes  Dizziness - Vertigo SEVERE dizziness (vertigo) (e.g., unable to walk without assistance)    Final Disposition User   Go to ED Now (or PCP triage) Harlow Mares, RN, Rhonda    Referrals  [facility] - ED   Disagree/Comply: Comply
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
## 2016-02-13 DIAGNOSIS — J301 Allergic rhinitis due to pollen: Secondary | ICD-10-CM | POA: Diagnosis not present

## 2016-02-13 DIAGNOSIS — E7800 Pure hypercholesterolemia, unspecified: Secondary | ICD-10-CM | POA: Diagnosis not present

## 2016-02-13 DIAGNOSIS — E1165 Type 2 diabetes mellitus with hyperglycemia: Secondary | ICD-10-CM | POA: Diagnosis not present

## 2016-02-13 DIAGNOSIS — J3089 Other allergic rhinitis: Secondary | ICD-10-CM | POA: Diagnosis not present

## 2016-02-15 ENCOUNTER — Other Ambulatory Visit: Payer: Self-pay

## 2016-02-15 DIAGNOSIS — Z794 Long term (current) use of insulin: Secondary | ICD-10-CM | POA: Diagnosis not present

## 2016-02-15 DIAGNOSIS — E7800 Pure hypercholesterolemia, unspecified: Secondary | ICD-10-CM | POA: Diagnosis not present

## 2016-02-15 DIAGNOSIS — E1165 Type 2 diabetes mellitus with hyperglycemia: Secondary | ICD-10-CM | POA: Diagnosis not present

## 2016-02-15 MED ORDER — TESTOSTERONE POWD: Status: DC

## 2016-02-16 DIAGNOSIS — R5382 Chronic fatigue, unspecified: Secondary | ICD-10-CM | POA: Diagnosis not present

## 2016-02-28 DIAGNOSIS — G4733 Obstructive sleep apnea (adult) (pediatric): Secondary | ICD-10-CM | POA: Diagnosis not present

## 2016-03-05 DIAGNOSIS — Z4789 Encounter for other orthopedic aftercare: Secondary | ICD-10-CM | POA: Diagnosis not present

## 2016-03-05 DIAGNOSIS — M7060 Trochanteric bursitis, unspecified hip: Secondary | ICD-10-CM | POA: Diagnosis not present

## 2016-03-05 DIAGNOSIS — M5136 Other intervertebral disc degeneration, lumbar region: Secondary | ICD-10-CM | POA: Diagnosis not present

## 2016-03-06 ENCOUNTER — Telehealth: Payer: Self-pay

## 2016-03-06 DIAGNOSIS — J3089 Other allergic rhinitis: Secondary | ICD-10-CM | POA: Diagnosis not present

## 2016-03-06 DIAGNOSIS — Z471 Aftercare following joint replacement surgery: Secondary | ICD-10-CM | POA: Diagnosis not present

## 2016-03-06 DIAGNOSIS — L820 Inflamed seborrheic keratosis: Secondary | ICD-10-CM | POA: Diagnosis not present

## 2016-03-06 DIAGNOSIS — L821 Other seborrheic keratosis: Secondary | ICD-10-CM | POA: Diagnosis not present

## 2016-03-06 DIAGNOSIS — L814 Other melanin hyperpigmentation: Secondary | ICD-10-CM | POA: Diagnosis not present

## 2016-03-06 DIAGNOSIS — J301 Allergic rhinitis due to pollen: Secondary | ICD-10-CM | POA: Diagnosis not present

## 2016-03-06 DIAGNOSIS — L718 Other rosacea: Secondary | ICD-10-CM | POA: Diagnosis not present

## 2016-03-06 DIAGNOSIS — M71372 Other bursal cyst, left ankle and foot: Secondary | ICD-10-CM | POA: Diagnosis not present

## 2016-03-06 DIAGNOSIS — Z96651 Presence of right artificial knee joint: Secondary | ICD-10-CM | POA: Diagnosis not present

## 2016-03-06 DIAGNOSIS — D1801 Hemangioma of skin and subcutaneous tissue: Secondary | ICD-10-CM | POA: Diagnosis not present

## 2016-03-06 NOTE — Telephone Encounter (Signed)
I called patient to have her bring in CPAP to appt tomorrow. She states that she was advised by PCP to stop using in mid-June due to vertigo.

## 2016-03-07 ENCOUNTER — Ambulatory Visit (INDEPENDENT_AMBULATORY_CARE_PROVIDER_SITE_OTHER): Payer: PPO | Admitting: Neurology

## 2016-03-07 ENCOUNTER — Encounter: Payer: Self-pay | Admitting: Neurology

## 2016-03-07 VITALS — BP 138/79 | HR 89 | Wt 153.5 lb

## 2016-03-07 DIAGNOSIS — G4733 Obstructive sleep apnea (adult) (pediatric): Secondary | ICD-10-CM

## 2016-03-07 DIAGNOSIS — Z9989 Dependence on other enabling machines and devices: Principal | ICD-10-CM

## 2016-03-07 NOTE — Patient Instructions (Signed)
We will have Robin from the sleep lab call you, most likely early next week.   If you need to reach the sleep, call [phone]. This is her direct line and please leave a message with your phone number to call back if you get the voicemail box. She will call back as soon as possible.
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
## 2016-03-08 ENCOUNTER — Telehealth: Payer: Self-pay

## 2016-03-08 NOTE — Telephone Encounter (Signed)
Sounds good thx

## 2016-03-08 NOTE — Telephone Encounter (Signed)
Left patient message to call me regarding her mask. I have heard of patients feeling dizzy with pillows and too much moisture. I will give her a nasal mask and have her adjust her humidity to see if this helps.

## 2016-03-11 NOTE — Telephone Encounter (Signed)
Patient came to sleep lab for mask fitting and questions regarding her vertigo. I explained that in rare cases too much humidity combined with nasal pillows can cause vertigo. I showed her how to turn humidity down. She is currently on 4 and will turn this down to 2. I fitted her for a ResMed Airfit N20 medium. There was a 0 leak on cpap of 6. She liked this mask and could tell a difference in the feeling verses nasal pillows. I gave her this mask to take home and try.She will call me in a week or so to let me know how this works and if she feels better.

## 2016-03-11 NOTE — Telephone Encounter (Signed)
Sounds like a plan, thank you very much, [name]!

## 2016-03-13 ENCOUNTER — Other Ambulatory Visit: Payer: Self-pay | Admitting: Women's Health

## 2016-03-13 DIAGNOSIS — Z1231 Encounter for screening mammogram for malignant neoplasm of breast: Secondary | ICD-10-CM

## 2016-03-20 ENCOUNTER — Encounter: Payer: Self-pay | Admitting: Endocrinology

## 2016-03-24 ENCOUNTER — Other Ambulatory Visit: Payer: Self-pay | Admitting: Internal Medicine

## 2016-03-26 DIAGNOSIS — M5416 Radiculopathy, lumbar region: Secondary | ICD-10-CM | POA: Diagnosis not present

## 2016-03-29 ENCOUNTER — Ambulatory Visit: Payer: PPO

## 2016-03-30 DIAGNOSIS — G4733 Obstructive sleep apnea (adult) (pediatric): Secondary | ICD-10-CM | POA: Diagnosis not present

## 2016-03-31 ENCOUNTER — Encounter: Payer: Self-pay | Admitting: Neurology

## 2016-04-02 ENCOUNTER — Telehealth: Payer: Self-pay | Admitting: *Deleted

## 2016-04-02 ENCOUNTER — Encounter: Payer: Self-pay | Admitting: Neurology

## 2016-04-02 NOTE — Telephone Encounter (Signed)
Called pt on home number. She is willing to come for  Migraine infusion tomorrow. She will come at 1030am and her husband will be bringing her. I educated her that she cannot drive d/t some of the medications making her drowsy. She will be receiving depacon, toradol, compazine, solu-medrol. I explained what each of these medications were for.  I also scheduled earlier f/u for 8/17 at 10am, check in 845am. I cx appt on 8/31.

## 2016-04-02 NOTE — Telephone Encounter (Signed)
LVM for pt to call. Gave [facility] phone number. Advised Dr Jaynee Eagles received her [employer].   Per Dr Jaynee Eagles she can come for migraine cocktail tomorrow with Otila Kluver in intrafusion any time in the morning.   Also, please schedule f/u earlier per Dr Jaynee Eagles request. Can offer 8/17 at 10am for f/u. I can unblock if this works for pt.

## 2016-04-02 NOTE — Telephone Encounter (Signed)
Dr Ahern- FYI 

## 2016-04-03 DIAGNOSIS — G43009 Migraine without aura, not intractable, without status migrainosus: Secondary | ICD-10-CM | POA: Diagnosis not present

## 2016-04-04 NOTE — Telephone Encounter (Signed)
Pt called said her head his not hurting as bad today. She is experiencing some dizziness today and thinks it is due to the cocktail and not the HA. Please call

## 2016-04-04 NOTE — Telephone Encounter (Signed)
Called patient back. Advised per Dr Jaynee Eagles it can take some time to resolve. Make sure she stays hydrated and rests. If sx worsen or she has new sx, she can call back. She stated she has a slight headache but a lot better than yesterday.

## 2016-04-09 ENCOUNTER — Ambulatory Visit
Admission: RE | Admit: 2016-04-09 | Discharge: 2016-04-09 | Disposition: A | Discharge status: Home / Self Care | Payer: PPO | Source: Ambulatory Visit | Attending: Women's Health | Admitting: Women's Health

## 2016-04-09 DIAGNOSIS — Z1231 Encounter for screening mammogram for malignant neoplasm of breast: Secondary | ICD-10-CM

## 2016-04-10 ENCOUNTER — Encounter: Payer: Self-pay | Admitting: Women's Health

## 2016-04-10 ENCOUNTER — Ambulatory Visit (INDEPENDENT_AMBULATORY_CARE_PROVIDER_SITE_OTHER): Payer: PPO | Admitting: Women's Health

## 2016-04-10 VITALS — BP 118/80 | Ht 62.0 in | Wt 152.0 lb

## 2016-04-10 DIAGNOSIS — R350 Frequency of micturition: Secondary | ICD-10-CM | POA: Diagnosis not present

## 2016-04-10 DIAGNOSIS — M8580 Other specified disorders of bone density and structure, unspecified site: Secondary | ICD-10-CM

## 2016-04-10 DIAGNOSIS — M899 Disorder of bone, unspecified: Secondary | ICD-10-CM | POA: Diagnosis not present

## 2016-04-10 DIAGNOSIS — N952 Postmenopausal atrophic vaginitis: Secondary | ICD-10-CM | POA: Diagnosis not present

## 2016-04-10 DIAGNOSIS — B3731 Acute candidiasis of vulva and vagina: Secondary | ICD-10-CM

## 2016-04-10 DIAGNOSIS — J3089 Other allergic rhinitis: Secondary | ICD-10-CM | POA: Diagnosis not present

## 2016-04-10 DIAGNOSIS — J301 Allergic rhinitis due to pollen: Secondary | ICD-10-CM | POA: Diagnosis not present

## 2016-04-10 DIAGNOSIS — B373 Candidiasis of vulva and vagina: Secondary | ICD-10-CM

## 2016-04-10 DIAGNOSIS — Z13820 Encounter for screening for osteoporosis: Secondary | ICD-10-CM

## 2016-04-10 DIAGNOSIS — Z79890 Hormone replacement therapy (postmenopausal): Secondary | ICD-10-CM

## 2016-04-10 LAB — URINALYSIS W MICROSCOPIC + REFLEX CULTURE
Bilirubin Urine: NEGATIVE
CRYSTALS: NONE SEEN [HPF]
GLUCOSE, UA: NEGATIVE
HGB URINE DIPSTICK: NEGATIVE
Ketones, ur: NEGATIVE
Leukocytes, UA: NEGATIVE
Nitrite: NEGATIVE
PROTEIN: NEGATIVE
RBC / HPF: NONE SEEN RBC/HPF (ref <=2)
Specific Gravity, Urine: 1.015 (ref 1.001–1.035)
WBC UA: NONE SEEN WBC/HPF (ref <=5)
YEAST: NONE SEEN [HPF]
pH: 7.5 (ref 5.0–8.0)

## 2016-04-10 LAB — WET PREP FOR TRICH, YEAST, CLUE
CLUE CELLS WET PREP: NONE SEEN
Trich, Wet Prep: NONE SEEN
WBC WET PREP: NONE SEEN
YEAST WET PREP: NONE SEEN

## 2016-04-10 MED ORDER — FLUCONAZOLE 200 MG PO TABS: ORAL_TABLET | ORAL | 3 refills | Status: DC

## 2016-04-10 MED ORDER — ESTRADIOL 0.025 MG/24HR TD PTWK: 0.0250 mg | MEDICATED_PATCH | TRANSDERMAL | 4 refills | Status: DC

## 2016-04-10 MED ORDER — ESTROGENS, CONJUGATED 0.625 MG/GM VA CREA: 1.0000 | TOPICAL_CREAM | VAGINAL | 4 refills | Status: DC

## 2016-04-10 NOTE — Progress Notes (Signed)
TAKIYAH DRZEWIECKI June 06, 1941 [id number]    History:    Presents for breast and pelvic exam. TAH with BSO for fibroids on estradiol 0.375 patch weekly. Numerous hot flashes when off estrogen. Normal Pap and mammogram history. 2013 benign colon polyps, 11/2015 negative colonoscopy. Current on vaccines. 2014 T score -1.4 FRAX 9.6%/1.3%. Type 2 diabetes on insulin, metformin per primary care. History of IBS. Has chronic right back pain and right knee pain has lost about 20 pounds in the past year has not been feeling well.  Past medical history, past surgical history, family history and social history were all reviewed and documented in the EPIC chart. Has 3 sons and 8 granddaughters.  ROS:  A ROS was performed and pertinent positives and negatives are included.  Exam:  Vitals:   04/10/16 1142  Weight: 152 lb (68.9 kg)  Height: 5\' 2"  (1.575 m)   Body mass index is 27.8 kg/m.   General appearance:  Normal Thyroid:  Symmetrical, normal in size, without palpable masses or nodularity. Respiratory  Auscultation:  Clear without wheezing or rhonchi Cardiovascular  Auscultation:  Regular rate, without rubs, murmurs or gallops  Edema/varicosities:  Not grossly evident Abdominal  Soft,nontender, without masses, guarding or rebound.  Liver/spleen:  No organomegaly noted  Hernia:  None appreciated  Skin  Inspection:  Grossly normal   Breasts: Examined lying and sitting.     Right: Without masses, retractions, discharge or axillary adenopathy.     Left: Without masses, retractions, discharge or axillary adenopathy. Gentitourinary   Inguinal/mons:  Normal without inguinal adenopathy  External genitalia:  Erythematous BUS/Urethra/Skene's glands:  Normal  Vagina:  Normal  Cervix:  And uterus absent Adnexa/parametria:     Rt: Without masses or tenderness.   Lt: Without masses or tenderness.  Anus and perineum: Normal  Digital rectal exam: Normal sphincter tone without palpated masses or  tenderness  Assessment/Plan:  75 y.o. MWF G3 P3 for Breast and pelvic exam.   TAH with BSO on estradiol patch Persistent vaginal irritation/itching Type 2 diabetes medications per primary care Labs-primary care Osteopenia without elevated FRAX Sleep apnea on CPAP IBS-gastroenterologist  Plan: Reviewed wet prep negative, prescription for Diflucan 200 mg by mouth when necessary prescription given. Yeast prevention discussed. SBE's, annual screening mammogram, calcium rich diet, vitamin D supplement per primary care. Home safety, fall prevention and importance of weightbearing exercise reviewed. Repeat DEXA will schedule. HRT reviewed less is best risk of blood clots, strokes and breast cancer reviewed will try estradiol 0.025 patch weekly prescription, proper use given instructed to call if not able to tolerate, has been on 0.0375 estradiol patch weekly.    Huel Cote [facility], 11:46 AM 04/10/2016

## 2016-04-10 NOTE — Addendum Note (Signed)
Addended by: Burnett Kanaris on: 04/10/2016 01:56 PM   Modules accepted: Orders

## 2016-04-10 NOTE — Patient Instructions (Signed)
Menopause is a normal process in which your reproductive ability comes to an end. This process happens gradually over a span of months to years, usually between the ages of 48 and 55. Menopause is complete when you have missed 12 consecutive menstrual periods. It is important to talk with your health care provider about some of the most common conditions that affect postmenopausal women, such as heart disease, cancer, and bone loss (osteoporosis). Adopting a healthy lifestyle and getting preventive care can help to promote your health and wellness. Those actions can also lower your chances of developing some of these common conditions. WHAT SHOULD I KNOW ABOUT MENOPAUSE? During menopause, you may experience a number of symptoms, such as:  Moderate-to-severe hot flashes.  Night sweats.  Decrease in sex drive.  Mood swings.  Headaches.  Tiredness.  Irritability.  Memory problems.  Insomnia. Choosing to treat or not to treat menopausal changes is an individual decision that you make with your health care provider. WHAT SHOULD I KNOW ABOUT HORMONE REPLACEMENT THERAPY AND SUPPLEMENTS? Hormone therapy products are effective for treating symptoms that are associated with menopause, such as hot flashes and night sweats. Hormone replacement carries certain risks, especially as you become older. If you are thinking about using estrogen or estrogen with progestin treatments, discuss the benefits and risks with your health care provider. WHAT SHOULD I KNOW ABOUT HEART DISEASE AND STROKE? Heart disease, heart attack, and stroke become more likely as you age. This may be due, in part, to the hormonal changes that your body experiences during menopause. These can affect how your body processes dietary fats, triglycerides, and cholesterol. Heart attack and stroke are both medical emergencies. There are many things that you can do to help prevent heart disease and stroke:  Have your blood pressure  checked at least every 1-2 years. High blood pressure causes heart disease and increases the risk of stroke.  If you are 55-79 years old, ask your health care provider if you should take aspirin to prevent a heart attack or a stroke.  Do not use any tobacco products, including cigarettes, chewing tobacco, or electronic cigarettes. If you need help quitting, ask your health care provider.  It is important to eat a healthy diet and maintain a healthy weight.  Be sure to include plenty of vegetables, fruits, low-fat dairy products, and lean protein.  Avoid eating foods that are high in solid fats, added sugars, or salt (sodium).  Get regular exercise. This is one of the most important things that you can do for your health.  Try to exercise for at least 150 minutes each week. The type of exercise that you do should increase your heart rate and make you sweat. This is known as moderate-intensity exercise.  Try to do strengthening exercises at least twice each week. Do these in addition to the moderate-intensity exercise.  Know your numbers.Ask your health care provider to check your cholesterol and your blood glucose. Continue to have your blood tested as directed by your health care provider. WHAT SHOULD I KNOW ABOUT CANCER SCREENING? There are several types of cancer. Take the following steps to reduce your risk and to catch any cancer development as early as possible. Breast Cancer  Practice breast self-awareness.  This means understanding how your breasts normally appear and feel.  It also means doing regular breast self-exams. Let your health care provider know about any changes, no matter how small.  If you are 40 or older, have a clinician do a   breast exam (clinical breast exam or CBE) every year. Depending on your age, family history, and medical history, it may be recommended that you also have a yearly breast X-ray (mammogram).  If you have a family history of breast cancer,  talk with your health care provider about genetic screening.  If you are at high risk for breast cancer, talk with your health care provider about having an MRI and a mammogram every year.  Breast cancer (BRCA) gene test is recommended for women who have family members with BRCA-related cancers. Results of the assessment will determine the need for genetic counseling and BRCA1 and for BRCA2 testing. BRCA-related cancers include these types:  Breast. This occurs in males or females.  Ovarian.  Tubal. This may also be called fallopian tube cancer.  Cancer of the abdominal or pelvic lining (peritoneal cancer).  Prostate.  Pancreatic. Cervical, Uterine, and Ovarian Cancer Your health care provider may recommend that you be screened regularly for cancer of the pelvic organs. These include your ovaries, uterus, and vagina. This screening involves a pelvic exam, which includes checking for microscopic changes to the surface of your cervix (Pap test).  For women ages 21-65, health care providers may recommend a pelvic exam and a Pap test every three years. For women ages 77-65, they may recommend the Pap test and pelvic exam, combined with testing for human papilloma virus (HPV), every five years. Some types of HPV increase your risk of cervical cancer. Testing for HPV may also be done on women of any age who have unclear Pap test results.  Other health care providers may not recommend any screening for nonpregnant women who are considered low risk for pelvic cancer and have no symptoms. Ask your health care provider if a screening pelvic exam is right for you.  If you have had past treatment for cervical cancer or a condition that could lead to cancer, you need Pap tests and screening for cancer for at least 20 years after your treatment. If Pap tests have been discontinued for you, your risk factors (such as having a new sexual partner) need to be reassessed to determine if you should start having  screenings again. Some women have medical problems that increase the chance of getting cervical cancer. In these cases, your health care provider may recommend that you have screening and Pap tests more often.  If you have a family history of uterine cancer or ovarian cancer, talk with your health care provider about genetic screening.  If you have vaginal bleeding after reaching menopause, tell your health care provider.  There are currently no reliable tests available to screen for ovarian cancer. Lung Cancer Lung cancer screening is recommended for adults 3-70 years old who are at high risk for lung cancer because of a history of smoking. A yearly low-dose CT scan of the lungs is recommended if you:  Currently smoke.  Have a history of at least 30 pack-years of smoking and you currently smoke or have quit within the past 15 years. A pack-year is smoking an average of one pack of cigarettes per day for one year. Yearly screening should:  Continue until it has been 15 years since you quit.  Stop if you develop a health problem that would prevent you from having lung cancer treatment. Colorectal Cancer  This type of cancer can be detected and can often be prevented.  Routine colorectal cancer screening usually begins at age 38 and continues through age 12.  If you have  risk factors for colon cancer, your health care provider may recommend that you be screened at an earlier age.  If you have a family history of colorectal cancer, talk with your health care provider about genetic screening.  Your health care provider may also recommend using home test kits to check for hidden blood in your stool.  A small camera at the end of a tube can be used to examine your colon directly (sigmoidoscopy or colonoscopy). This is done to check for the earliest forms of colorectal cancer.  Direct examination of the colon should be repeated every 5-10 years until age 67. However, if early forms of  precancerous polyps or small growths are found or if you have a family history or genetic risk for colorectal cancer, you may need to be screened more often. Skin Cancer  Check your skin from head to toe regularly.  Monitor any moles. Be sure to tell your health care provider:  About any new moles or changes in moles, especially if there is a change in a mole's shape or color.  If you have a mole that is larger than the size of a pencil eraser.  If any of your family members has a history of skin cancer, especially at a Rishi Vicario age, talk with your health care provider about genetic screening.  Always use sunscreen. Apply sunscreen liberally and repeatedly throughout the day.  Whenever you are outside, protect yourself by wearing long sleeves, pants, a wide-brimmed hat, and sunglasses. WHAT SHOULD I KNOW ABOUT OSTEOPOROSIS? Osteoporosis is a condition in which bone destruction happens more quickly than new bone creation. After menopause, you may be at an increased risk for osteoporosis. To help prevent osteoporosis or the bone fractures that can happen because of osteoporosis, the following is recommended:  If you are 39-61 years old, get at least 1,000 mg of calcium and at least 600 mg of vitamin D per day.  If you are older than age 16 but younger than age 7, get at least 1,200 mg of calcium and at least 600 mg of vitamin D per day.  If you are older than age 47, get at least 1,200 mg of calcium and at least 800 mg of vitamin D per day. Smoking and excessive alcohol intake increase the risk of osteoporosis. Eat foods that are rich in calcium and vitamin D, and do weight-bearing exercises several times each week as directed by your health care provider. WHAT SHOULD I KNOW ABOUT HOW MENOPAUSE AFFECTS [name]? Depression may occur at any age, but it is more common as you become older. Common symptoms of depression include:  Low or sad mood.  Changes in sleep patterns.  Changes  in appetite or eating patterns.  Feeling an overall lack of motivation or enjoyment of activities that you previously enjoyed.  Frequent crying spells. Talk with your health care provider if you think that you are experiencing depression. WHAT SHOULD I KNOW ABOUT IMMUNIZATIONS? It is important that you get and maintain your immunizations. These include:  Tetanus, diphtheria, and pertussis (Tdap) booster vaccine.  Influenza every year before the flu season begins.  Pneumonia vaccine.  Shingles vaccine. Your health care provider may also recommend other immunizations.   This information is not intended to replace advice given to you by your health care provider. Make sure you discuss any questions you have with your health care provider.   Document Released: 10/04/2005 Document Revised: 09/02/2014 Document Reviewed: 04/14/2014 Else[employer] Interactive Patient Education 2016 [employer] Hormone Therapy At menopause, your body begins making less estrogen and progesterone hormones. This causes the body to stop having menstrual periods. This is because estrogen and progesterone hormones control your periods and menstrual cycle. A lack of estrogen may cause symptoms such as:  Hot flushes (or hot flashes).  Vaginal dryness.  Dry skin.  Loss of sex drive.  Risk of bone loss (osteoporosis). When this happens, you may choose to take hormone therapy to get back the estrogen lost during menopause. When the hormone estrogen is given alone, it is usually referred to as ET (Estrogen Therapy). When the hormone progestin is combined with estrogen, it is generally called HT (Hormone Therapy). This was formerly known as hormone replacement therapy (HRT). Your caregiver can help you make a decision on what will be best for you. The decision to use HT seems to change often as new studies are done. Many studies do not agree on the benefits of hormone replacement therapy. LIKELY BENEFITS OF HT INCLUDE  PROTECTION FROM:  Hot Flushes (also called hot flashes) - A hot flush is a sudden feeling of heat that spreads over the face and body. The skin may redden like a blush. It is connected with sweats and sleep disturbance. Women going through menopause may have hot flushes a few times a month or several times per day depending on the woman.  Osteoporosis (bone loss) - Estrogen helps guard against bone loss. After menopause, a woman's bones slowly lose calcium and become weak and brittle. As a result, bones are more likely to break. The hip, wrist, and spine are affected most often. Hormone therapy can help slow bone loss after menopause. Weight bearing exercise and taking calcium with vitamin D also can help prevent bone loss. There are also medications that your caregiver can prescribe that can help prevent osteoporosis.  Vaginal dryness - Loss of estrogen causes changes in the vagina. Its lining may become thin and dry. These changes can cause pain and bleeding during sexual intercourse. Dryness can also lead to infections. This can cause burning and itching. (Vaginal estrogen treatment can help relieve pain, itching, and dryness.)  Urinary tract infections are more common after menopause because of lack of estrogen. Some women also develop urinary incontinence because of low estrogen levels in the vagina and bladder.  Possible other benefits of estrogen include a positive effect on mood and short-term memory in women. RISKS AND COMPLICATIONS  Using estrogen alone without progesterone causes the lining of the uterus to grow. This increases the risk of lining of the uterus (endometrial) cancer. Your caregiver should give another hormone called progestin if you have a uterus.  Women who take combined (estrogen and progestin) HT appear to have an increased risk of breast cancer. The risk appears to be small, but increases throughout the time that HT is taken.  Combined therapy also makes the breast  tissue slightly denser which makes it harder to read mammograms (breast X-rays).  Combined, estrogen and progesterone therapy can be taken together every day, in which case there may be spotting of blood. HT therapy can be taken cyclically in which case you will have menstrual periods. Cyclically means HT is taken for a set amount of days, then not taken, then this process is repeated.  HT may increase the risk of stroke, heart attack, breast cancer and forming blood clots in your leg.  Transdermal estrogen (estrogen that is absorbed through the skin with a patch or a cream) may have better  results with:  Cholesterol.  Blood pressure.  Blood clots. Having the following conditions may indicate you should not have HT:  Endometrial cancer.  Liver disease.  Breast cancer.  Heart disease.  History of blood clots.  Stroke. TREATMENT   If you choose to take HT and have a uterus, usually estrogen and progestin are prescribed.  Your caregiver will help you decide the best way to take the medications.  Possible ways to take estrogen include:  Pills.  Patches.  Gels.  Sprays.  Vaginal estrogen cream, rings and tablets.  It is best to take the lowest dose possible that will help your symptoms and take them for the shortest period of time that you can.  Hormone therapy can help relieve some of the problems (symptoms) that affect women at menopause. Before making a decision about HT, talk to your caregiver about what is best for you. Be well informed and comfortable with your decisions. HOME CARE INSTRUCTIONS   Follow your caregivers advice when taking the medications.  A Pap test is done to screen for cervical cancer.  The first Pap test should be done at age 69.  Between ages 73 and 66, Pap tests are repeated every 2 years.  Beginning at age 89, you are advised to have a Pap test every 3 years as long as the past 3 Pap tests have been normal.  Some women have medical  problems that increase the chance of getting cervical cancer. Talk to your caregiver about these problems. It is especially important to talk to your caregiver if a new problem develops soon after your last Pap test. In these cases, your caregiver may recommend more frequent screening and Pap tests.  The above recommendations are the same for women who have or have not gotten the vaccine for HPV (human papillomavirus).  If you had a hysterectomy for a problem that was not a cancer or a condition that could lead to cancer, then you no longer need Pap tests. However, even if you no longer need a Pap test, a regular exam is a good idea to make sure no other problems are starting.  If you are between ages 50 and 23, and you have had normal Pap tests going back 10 years, you no longer need Pap tests. However, even if you no longer need a Pap test, a regular exam is a good idea to make sure no other problems are starting.  If you have had past treatment for cervical cancer or a condition that could lead to cancer, you need Pap tests and screening for cancer for at least 20 years after your treatment.  If Pap tests have been discontinued, risk factors (such as a new sexual partner)need to be re-assessed to determine if screening should be resumed.  Some women may need screenings more often if they are at high risk for cervical cancer.  Get mammograms done as per the advice of your caregiver. SEEK IMMEDIATE MEDICAL CARE IF:  You develop abnormal vaginal bleeding.  You have pain or swelling in your legs, shortness of breath, or chest pain.  You develop dizziness or headaches.  You have lumps or changes in your breasts or armpits.  You have slurred speech.  You develop weakness or numbness of your arms or legs.  You have pain, burning, or bleeding when urinating.  You develop abdominal pain.   This information is not intended to replace advice given to you by your health care provider. Make  sure you  discuss any questions you have with your health care provider.   Document Released: 05/11/2003 Document Revised: 12/27/2014 Document Reviewed: 02/13/2015 Elsevier Interactive Patient Education [employer].

## 2016-04-11 ENCOUNTER — Ambulatory Visit (INDEPENDENT_AMBULATORY_CARE_PROVIDER_SITE_OTHER): Payer: PPO | Admitting: Neurology

## 2016-04-11 ENCOUNTER — Encounter: Payer: Self-pay | Admitting: Neurology

## 2016-04-11 VITALS — BP 135/70 | HR 96 | Ht 62.0 in | Wt 153.4 lb

## 2016-04-11 DIAGNOSIS — G43709 Chronic migraine without aura, not intractable, without status migrainosus: Secondary | ICD-10-CM

## 2016-04-11 DIAGNOSIS — R51 Headache: Secondary | ICD-10-CM

## 2016-04-11 DIAGNOSIS — R519 Headache, unspecified: Secondary | ICD-10-CM

## 2016-04-11 DIAGNOSIS — G43701 Chronic migraine without aura, not intractable, with status migrainosus: Secondary | ICD-10-CM | POA: Diagnosis not present

## 2016-04-11 DIAGNOSIS — G43711 Chronic migraine without aura, intractable, with status migrainosus: Secondary | ICD-10-CM

## 2016-04-11 MED ORDER — TOPIRAMATE 25 MG PO TABS: 100.0000 mg | ORAL_TABLET | Freq: Every day | ORAL | 12 refills | Status: DC

## 2016-04-11 MED ORDER — ONDANSETRON 4 MG PO TBDP: ORAL_TABLET | ORAL | 12 refills | Status: DC

## 2016-04-11 MED ORDER — SUMATRIPTAN SUCCINATE 100 MG PO TABS: 100.0000 mg | ORAL_TABLET | Freq: Once | ORAL | 12 refills | Status: DC | PRN

## 2016-04-11 NOTE — Patient Instructions (Addendum)
Overall you are doing fairly well but I do want to suggest a few things today:   Remember to drink plenty of fluid, eat healthy meals and do not skip any meals. Try to eat protein with a every meal and eat a healthy snack such as fruit or nuts in between meals. Try to keep a regular sleep-wake schedule and try to exercise daily, particularly in the form of walking, 20-30 minutes a day, if you can.   As far as your medications are concerned, I would like to suggest Topiramate 25mg : Take one pill at bedtime for one week, then increase to 2 pills at bedtime for one week, then 3 pills at bedtime for one week and then 4 pills 100mg  at night At onset of headache take imitrex and zofran. May repeat in 2 hours if needed. No more than 2x in one day.   As far as diagnostic testing: Labs  I would like to see you back in 3 months, sooner if we need to. Please call us with any interim questions, concerns, problems, updates or refill requests. .   Our phone number is [phone]. We also have an after hours call service for urgent matters and there is a physician on-call for urgent questions. For any emergencies you know to call 911 or go to the nearest emergency room    Sumatriptan tablets What is this medicine? SUMATRIPTAN (soo ma TRIP tan) is used to treat migraines with or without aura. An aura is a strange feeling or visual disturbance that warns you of an attack. It is not used to prevent migraines. This medicine may be used for other purposes; ask your health care provider or pharmacist if you have questions. What should I tell my health care provider before I take this medicine? They need to know if you have any of these conditions: -circulation problems in fingers and toes -diabetes -heart disease -high blood pressure -high cholesterol -history of irregular heartbeat -history of stroke -kidney disease -liver disease -postmenopausal or surgical removal of uterus and  ovaries -seizures -smoke tobacco -stomach or intestine problems -an unusual or allergic reaction to sumatriptan, other medicines, foods, dyes, or preservatives -pregnant or trying to get pregnant -breast-feeding How should I use this medicine? Take this medicine by mouth with a glass of water. Follow the directions on the prescription label. This medicine is taken at the first symptoms of a migraine. It is not for everyday use. If your migraine headache returns after one dose, you can take another dose as directed. You must leave at least 2 hours between doses, and do not take more than 100 mg as a single dose. Do not take more than 200 mg total in any 24 hour period. If there is no improvement at all after the first dose, do not take a second dose without talking to your doctor or health care professional. Do not take your medicine more often than directed. Talk to your pediatrician regarding the use of this medicine in children. Special care may be needed. Overdosage: If you think you have taken too much of this medicine contact a poison control center or emergency room at once. NOTE: This medicine is only for you. Do not share this medicine with others. What if I miss a dose? This does not apply; this medicine is not for regular use. What may interact with this medicine? Do not take this medicine with any of the following medicines: -cocaine -ergot alkaloids like dihydroergotamine, ergonovine, ergotamine, methylergonovine -feverfew -MAOIs  like Carbex, Eldepryl, Marplan, Nardil, and Parnate -other medicines for migraine headache like almotriptan, eletriptan, frovatriptan, naratriptan, rizatriptan, zolmitriptan -tryptophan This medicine may also interact with the following medications: -certain medicines for depression, anxiety, or psychotic disturbances This list may not describe all possible interactions. Give your health care provider a list of all the medicines, herbs, non-prescription  drugs, or dietary supplements you use. Also tell them if you smoke, drink alcohol, or use illegal drugs. Some items may interact with your medicine. What should I watch for while using this medicine? Only take this medicine for a migraine headache. Take it if you get warning symptoms or at the start of a migraine attack. It is not for regular use to prevent migraine attacks. You may get drowsy or dizzy. Do not drive, use machinery, or do anything that needs mental alertness until you know how this medicine affects you. To reduce dizzy or fainting spells, do not sit or stand up quickly, especially if you are an older patient. Alcohol can increase drowsiness, dizziness and flushing. Avoid alcoholic drinks. Smoking cigarettes may increase the risk of heart-related side effects from using this medicine. If you take migraine medicines for 10 or more days a month, your migraines may get worse. Keep a diary of headache days and medicine use. Contact your healthcare professional if your migraine attacks occur more frequently. What side effects may I notice from receiving this medicine? Side effects that you should report to your doctor or health care professional as soon as possible: -allergic reactions like skin rash, itching or hives, swelling of the face, lips, or tongue -bloody or watery diarrhea -hallucination, loss of contact with reality -pain, tingling, numbness in the face, hands, or feet -seizures -signs and symptoms of a blood clot such as breathing problems; changes in vision; chest pain; severe, sudden headache; pain, swelling, warmth in the leg; trouble speaking; sudden numbness or weakness of the face, arm, or leg -signs and symptoms of a dangerous change in heartbeat or heart rhythm like chest pain; dizziness; fast or irregular heartbeat; palpitations, feeling faint or lightheaded; falls; breathing problems -signs and symptoms of a stroke like changes in vision; confusion; trouble speaking or  understanding; severe headaches; sudden numbness or weakness of the face, arm, or leg; trouble walking; dizziness; loss of balance or coordination -stomach pain Side effects that usually do not require medical attention (report these to your doctor or health care professional if they continue or are bothersome): -changes in taste -facial flushing -headache -muscle cramps -muscle pain -nausea, vomiting -weak or tired This list may not describe all possible side effects. Call your doctor for medical advice about side effects. You may report side effects to FDA at 1-800-FDA-1088. Where should I keep my medicine? Keep out of the reach of children. Store at room temperature between 2 and 30 degrees C (36 and 86 degrees F). Throw away any unused medicine after the expiration date. NOTE: This sheet is a summary. It may not cover all possible information. If you have questions about this medicine, talk to your doctor, pharmacist, or health care provider.    2016, [employer]. (2015-02-16 17:46:40)  Ondansetron oral dissolving tablet What is this medicine? ONDANSETRON (on DAN se tron) is used to treat nausea and vomiting caused by chemotherapy. It is also used to prevent or treat nausea and vomiting after surgery. This medicine may be used for other purposes; ask your health care provider or pharmacist if you have questions. What should I tell my health  care provider before I take this medicine? They need to know if you have any of these conditions: -heart disease -history of irregular heartbeat -liver disease -low levels of magnesium or potassium in the blood -an unusual or allergic reaction to ondansetron, granisetron, other medicines, foods, dyes, or preservatives -pregnant or trying to get pregnant -breast-feeding How should I use this medicine? These tablets are made to dissolve in the mouth. Do not try to push the tablet through the foil backing. With dry hands, peel away the  foil backing and gently remove the tablet. Place the tablet in the mouth and allow it to dissolve, then swallow. While you may take these tablets with water, it is not necessary to do so. Talk to your pediatrician regarding the use of this medicine in children. Special care may be needed. Overdosage: If you think you have taken too much of this medicine contact a poison control center or emergency room at once. NOTE: This medicine is only for you. Do not share this medicine with others. What if I miss a dose? If you miss a dose, take it as soon as you can. If it is almost time for your next dose, take only that dose. Do not take double or extra doses. What may interact with this medicine? Do not take this medicine with any of the following medications: -apomorphine -certain medicines for fungal infections like fluconazole, itraconazole, ketoconazole, posaconazole, voriconazole -cisapride -dofetilide -dronedarone -pimozide -thioridazine -ziprasidone This medicine may also interact with the following medications: -carbamazepine -certain medicines for depression, anxiety, or psychotic disturbances -fentanyl -linezolid -MAOIs like Carbex, Eldepryl, Marplan, Nardil, and Parnate -methylene blue (injected into a vein) -other medicines that prolong the QT interval (cause an abnormal heart rhythm) -phenytoin -rifampicin -tramadol This list may not describe all possible interactions. Give your health care provider a list of all the medicines, herbs, non-prescription drugs, or dietary supplements you use. Also tell them if you smoke, drink alcohol, or use illegal drugs. Some items may interact with your medicine. What should I watch for while using this medicine? Check with your doctor or health care professional as soon as you can if you have any sign of an allergic reaction. What side effects may I notice from receiving this medicine? Side effects that you should report to your doctor or health  care professional as soon as possible: -allergic reactions like skin rash, itching or hives, swelling of the face, lips, or tongue -breathing problems -confusion -dizziness -fast or irregular heartbeat -feeling faint or lightheaded, falls -fever and chills -loss of balance or coordination -seizures -sweating -swelling of the hands and feet -tightness in the chest -tremors -unusually weak or tired Side effects that usually do not require medical attention (report to your doctor or health care professional if they continue or are bothersome): -constipation or diarrhea -headache This list may not describe all possible side effects. Call your doctor for medical advice about side effects. You may report side effects to FDA at 1-800-FDA-1088. Where should I keep my medicine? Keep out of the reach of children. Store between 2 and 30 degrees C (36 and 86 degrees F). Throw away any unused medicine after the expiration date. NOTE: This sheet is a summary. It may not cover all possible information. If you have questions about this medicine, talk to your doctor, pharmacist, or health care provider.    2016, [employer]. (2013-05-19 16:21:52)  Topiramate tablets What is this medicine? TOPIRAMATE (toe PYRE a mate) is used to treat seizures in  adults or children with epilepsy. It is also used for the prevention of migraine headaches. This medicine may be used for other purposes; ask your health care provider or pharmacist if you have questions. What should I tell my health care provider before I take this medicine? They need to know if you have any of these conditions: -bleeding disorders -cirrhosis of the liver or liver disease -diarrhea -glaucoma -kidney stones or kidney disease -low blood counts, like low white cell, platelet, or red cell counts -lung disease like asthma, obstructive pulmonary disease, emphysema -metabolic acidosis -on a ketogenic diet -schedule for surgery  or a procedure -suicidal thoughts, plans, or attempt; a previous suicide attempt by you or a family member -an unusual or allergic reaction to topiramate, other medicines, foods, dyes, or preservatives -pregnant or trying to get pregnant -breast-feeding How should I use this medicine? Take this medicine by mouth with a glass of water. Follow the directions on the prescription label. Do not crush or chew. You may take this medicine with meals. Take your medicine at regular intervals. Do not take it more often than directed. Talk to your pediatrician regarding the use of this medicine in children. Special care may be needed. While this drug may be prescribed for children as young as 43 years of age for selected conditions, precautions do apply. Overdosage: If you think you have taken too much of this medicine contact a poison control center or emergency room at once. NOTE: This medicine is only for you. Do not share this medicine with others. What if I miss a dose? If you miss a dose, take it as soon as you can. If your next dose is to be taken in less than 6 hours, then do not take the missed dose. Take the next dose at your regular time. Do not take double or extra doses. What may interact with this medicine? Do not take this medicine with any of the following medications: -probenecid This medicine may also interact with the following medications: -acetazolamide -alcohol -amitriptyline -aspirin and aspirin-like medicines -birth control pills -certain medicines for depression -certain medicines for seizures -certain medicines that treat or prevent blood clots like warfarin, enoxaparin, dalteparin, apixaban, dabigatran, and rivaroxaban -digoxin -hydrochlorothiazide -lithium -medicines for pain, sleep, or muscle relaxation -metformin -methazolamide -NSAIDS, medicines for pain and inflammation, like ibuprofen or naproxen -pioglitazone -risperidone This list may not describe all possible  interactions. Give your health care provider a list of all the medicines, herbs, non-prescription drugs, or dietary supplements you use. Also tell them if you smoke, drink alcohol, or use illegal drugs. Some items may interact with your medicine. What should I watch for while using this medicine? Visit your doctor or health care professional for regular checks on your progress. Do not stop taking this medicine suddenly. This increases the risk of seizures if you are using this medicine to control epilepsy. Wear a medical identification bracelet or chain to say you have epilepsy or seizures, and carry a card that lists all your medicines. This medicine can decrease sweating and increase your body temperature. Watch for signs of deceased sweating or fever, especially in children. Avoid extreme heat, hot baths, and saunas. Be careful about exercising, especially in hot weather. Contact your health care provider right away if you notice a fever or decrease in sweating. You should drink plenty of fluids while taking this medicine. If you have had kidney stones in the past, this will help to reduce your chances of forming kidney stones. If  you have stomach pain, with nausea or vomiting and yellowing of your eyes or skin, call your doctor immediately. You may get drowsy, dizzy, or have blurred vision. Do not drive, use machinery, or do anything that needs mental alertness until you know how this medicine affects you. To reduce dizziness, do not sit or stand up quickly, especially if you are an older patient. Alcohol can increase drowsiness and dizziness. Avoid alcoholic drinks. If you notice blurred vision, eye pain, or other eye problems, seek medical attention at once for an eye exam. The use of this medicine may increase the chance of suicidal thoughts or actions. Pay special attention to how you are responding while on this medicine. Any worsening of mood, or thoughts of suicide or dying should be reported to  your health care professional right away. This medicine may increase the chance of developing metabolic acidosis. If left untreated, this can cause kidney stones, bone disease, or slowed growth in children. Symptoms include breathing fast, fatigue, loss of appetite, irregular heartbeat, or loss of consciousness. Call your doctor immediately if you experience any of these side effects. Also, tell your doctor about any surgery you plan on having while taking this medicine since this may increase your risk for metabolic acidosis. Birth control pills may not work properly while you are taking this medicine. Talk to your doctor about using an extra method of birth control. Women who become pregnant while using this medicine may enroll in the [place] Pregnancy Registry by calling [phone]. This registry collects information about the safety of antiepileptic drug use during pregnancy. What side effects may I notice from receiving this medicine? Side effects that you should report to your doctor or health care professional as soon as possible: -allergic reactions like skin rash, itching or hives, swelling of the face, lips, or tongue -decreased sweating and/or rise in body temperature -depression -difficulty breathing, fast or irregular breathing patterns -difficulty speaking -difficulty walking or controlling muscle movements -hearing impairment -redness, blistering, peeling or loosening of the skin, including inside the mouth -tingling, pain or numbness in the hands or feet -unusual bleeding or bruising -unusually weak or tired -worsening of mood, thoughts or actions of suicide or dying Side effects that usually do not require medical attention (report to your doctor or health care professional if they continue or are bothersome): -altered taste -back pain, joint or muscle aches and pains -diarrhea, or constipation -headache -loss of appetite -nausea -stomach upset,  indigestion -tremors This list may not describe all possible side effects. Call your doctor for medical advice about side effects. You may report side effects to FDA at 1-800-FDA-1088. Where should I keep my medicine? Keep out of the reach of children. Store at room temperature between 15 and 30 degrees C (59 and 86 degrees F) in a tightly closed container. Protect from moisture. Throw away any unused medicine after the expiration date. NOTE: This sheet is a summary. It may not cover all possible information. If you have questions about this medicine, talk to your doctor, pharmacist, or health care provider.    2016, [employer]. (2013-08-16 23:17:57)
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
## 2016-04-12 LAB — SEDIMENTATION RATE: SED RATE: 2 mm/h (ref 0–40)

## 2016-04-12 LAB — URINE CULTURE

## 2016-04-12 LAB — C-REACTIVE PROTEIN: CRP: 1.3 mg/L (ref 0.0–4.9)

## 2016-04-13 DIAGNOSIS — G43709 Chronic migraine without aura, not intractable, without status migrainosus: Secondary | ICD-10-CM | POA: Insufficient documentation

## 2016-04-15 ENCOUNTER — Telehealth: Payer: Self-pay | Admitting: *Deleted

## 2016-04-15 NOTE — Telephone Encounter (Signed)
-----   Message from Melvenia Beam, MD sent at 04/12/2016  4:58 PM EDT ----- Labs normal thanks

## 2016-04-15 NOTE — Telephone Encounter (Signed)
LVM for pt about results. Gave [facility] phone number if she has further questions.

## 2016-04-25 ENCOUNTER — Other Ambulatory Visit: Payer: Self-pay | Admitting: Women's Health

## 2016-04-25 ENCOUNTER — Ambulatory Visit: Payer: PPO | Admitting: Neurology

## 2016-05-01 ENCOUNTER — Ambulatory Visit: Payer: PPO

## 2016-05-06 ENCOUNTER — Other Ambulatory Visit: Payer: Self-pay | Admitting: Gastroenterology

## 2016-05-07 ENCOUNTER — Ambulatory Visit (INDEPENDENT_AMBULATORY_CARE_PROVIDER_SITE_OTHER): Payer: PPO | Admitting: Nurse Practitioner

## 2016-05-07 ENCOUNTER — Encounter: Payer: Self-pay | Admitting: Nurse Practitioner

## 2016-05-07 ENCOUNTER — Other Ambulatory Visit (INDEPENDENT_AMBULATORY_CARE_PROVIDER_SITE_OTHER): Payer: PPO

## 2016-05-07 VITALS — BP 126/78 | HR 113 | Temp 98.0°F | Ht 62.0 in | Wt 150.0 lb

## 2016-05-07 DIAGNOSIS — R197 Diarrhea, unspecified: Secondary | ICD-10-CM | POA: Diagnosis not present

## 2016-05-07 DIAGNOSIS — E860 Dehydration: Secondary | ICD-10-CM

## 2016-05-07 DIAGNOSIS — E118 Type 2 diabetes mellitus with unspecified complications: Secondary | ICD-10-CM

## 2016-05-07 LAB — POCT URINALYSIS DIPSTICK
BILIRUBIN UA: NEGATIVE
Glucose, UA: NEGATIVE
KETONES UA: NEGATIVE
Leukocytes, UA: NEGATIVE
Nitrite, UA: NEGATIVE
PH UA: 6.5
Protein, UA: NEGATIVE
RBC UA: NEGATIVE
Spec Grav, UA: 1.015
Urobilinogen, UA: 0.2

## 2016-05-07 LAB — BASIC METABOLIC PANEL
BUN: 14 mg/dL (ref 6–23)
CO2: 33 mEq/L — ABNORMAL HIGH (ref 19–32)
CREATININE: 1.13 mg/dL (ref 0.40–1.20)
Calcium: 9.1 mg/dL (ref 8.4–10.5)
Chloride: 97 mEq/L (ref 96–112)
GFR: 49.93 mL/min — AB (ref >60.00)
Glucose, Bld: 178 mg/dL — ABNORMAL HIGH (ref 70–99)
Potassium: 3.5 mEq/L (ref 3.5–5.1)
Sodium: 136 mEq/L (ref 135–145)

## 2016-05-07 LAB — GLUCOSE, POCT (MANUAL RESULT ENTRY): POC GLUCOSE: 189 mg/dL — AB (ref 70–99)

## 2016-05-07 LAB — CBC WITH DIFFERENTIAL/PLATELET
BASOS PCT: 0.4 % (ref 0.0–3.0)
Basophils Absolute: 0.1 10*3/uL (ref 0.0–0.1)
EOS ABS: 0.2 10*3/uL (ref 0.0–0.7)
Eosinophils Relative: 1.8 % (ref 0.0–5.0)
HCT: 33.4 % — ABNORMAL LOW (ref 36.0–46.0)
HEMOGLOBIN: 11.1 g/dL — AB (ref 12.0–15.0)
LYMPHS ABS: 3.8 10*3/uL (ref 0.7–4.0)
Lymphocytes Relative: 33.2 % (ref 12.0–46.0)
MCHC: 33.3 g/dL (ref 30.0–36.0)
MCV: 90.2 fl (ref 78.0–100.0)
MONO ABS: 1 10*3/uL (ref 0.1–1.0)
Monocytes Relative: 8.9 % (ref 3.0–12.0)
NEUTROS PCT: 55.7 % (ref 43.0–77.0)
Neutro Abs: 6.4 10*3/uL (ref 1.4–7.7)
Platelets: 470 10*3/uL — ABNORMAL HIGH (ref 150.0–400.0)
RBC: 3.71 Mil/uL — AB (ref 3.87–5.11)
RDW: 16 % — AB (ref 11.5–15.5)
WBC: 11.6 10*3/uL — AB (ref 4.0–10.5)

## 2016-05-07 NOTE — Progress Notes (Signed)
Subjective:  Patient ID: [name], female    DOB: [birth year]  Age: 75 y.o. MRN: [id number]  CC: Cough (productive (dark yellow), sore throat, low-grade fever per spouse, and has developed dark coffee-ground diarrhea with fecal urgency since taking some of spouse's ABX)   Diarrhea   This is a new problem. The current episode started in the past 7 days. The problem occurs 2 to 4 times per day. The problem has been gradually worsening. Diarrhea characteristics: coffee brown. The patient states that diarrhea awakens her from sleep. Associated symptoms include abdominal pain, chills, coughing, increased flatus, myalgias, a URI and vomiting. Pertinent negatives include no fever. Exacerbated by: Onset of diarrhea after taking more days of amoxicillin. There is no history of inflammatory bowel disease, irritable bowel syndrome, malabsorption or a recent abdominal surgery.  URI   This is a new problem. The current episode started in the past 7 days. The problem has been unchanged. There has been no fever. Associated symptoms include abdominal pain, congestion, coughing, diarrhea, joint pain, nausea, a plugged ear sensation, rhinorrhea, a sore throat, swollen glands and vomiting. Pertinent negatives include no chest pain, dysuria or ear pain. Treatments tried: amoxicillin. The treatment provided no relief.  . Cough is productive with clear mucus. No shortness of breath, no edema, no PND. Husband was evaluated at Hospital for acute upper respiratory symptoms.  Last CBG at home was 92 fasting (today)  Outpatient Medications Prior to Visit  Medication Sig Dispense Refill  . aspirin EC 81 MG tablet Take 1 tablet (81 mg total) by mouth daily. 100 tablet 3  . b complex vitamins tablet Take 2 tablets by mouth daily. Vitamin supplement    . BD PEN NEEDLE NANO U/F 32G X 4 MM MISC USE AS DIRECTED TWICE DAILY.  3  . busPIRone (BUSPAR) 15 MG tablet Take 1 tablet by mouth 2 (two) times daily.    .  calcium-vitamin D (OSCAL WITH D) 500-200 MG-UNIT per tablet Take 1 tablet by mouth daily with breakfast.    . cetirizine (ZYRTEC) 10 MG tablet Take 10 mg by mouth daily as needed for allergies.     Marland Kitchen conjugated estrogens (PREMARIN) vaginal cream Place 1 Applicatorful vaginally 2 (two) times a week. 42.5 g 4  . dicyclomine (BENTYL) 10 MG capsule TAKE 1 CAPSULE (10 MG TOTAL) BY MOUTH 3 (THREE) TIMES DAILY BEFORE MEALS. 270 capsule 1  . estradiol (CLIMARA - DOSED IN MG/24 HR) 0.025 mg/24hr patch Place 1 patch (0.025 mg total) onto the skin once a week. APPLY 1 PATCH ONTO SKIN ONCE WEEKLY 12 patch 4  . fluconazole (DIFLUCAN) 200 MG tablet Take as needed every third day 15 tablet 3  . furosemide (LASIX) 20 MG tablet TAKE 1 TO 2 TABLETS BY MOUTH EVERY DAY (Patient taking differently: TAKE 1 TABLET BY MOUTH EVERY DAY) 60 tablet 5  . insulin glargine (LANTUS) 100 UNIT/ML injection Inject into the skin every evening. 8 OR 10 UNITS    . L-Methylfolate (DEPLIN) 15 MG TABS Take 15 mg by mouth daily. For treatment of depression    . Linaclotide (LINZESS) 145 MCG CAPS capsule Take 1 capsule (145 mcg total) by mouth daily. (Patient taking differently: Take 145 mcg by mouth daily as needed. ) 30 capsule 11  . Liniments (SALONPAS PAIN RELIEF PATCH EX) Apply 1 patch topically at bedtime.    Marland Kitchen LORazepam (ATIVAN) 1 MG tablet Take 1 mg by mouth 4 (four) times daily as needed for anxiety.  2  . lovastatin (MEVACOR) 20 MG tablet TAKE 1 TABLET BY MOUTH EVERY DAY **NEED OV** (Patient taking differently: 1 TAB EVERY OTHER DAY) 90 tablet 3  . metFORMIN (GLUCOPHAGE-XR) 500 MG 24 hr tablet Take 1,000 mg by mouth 2 (two) times daily. For DM type 2    . Multiple Vitamins-Minerals (MULTIVITAMIN,TX-MINERALS) tablet Take 1 tablet by mouth daily.     . ondansetron (ZOFRAN ODT) 4 MG disintegrating tablet Take 1-2 tablet (4-8 mg total) by mouth every 8 (eight) hours as needed for nausea or vomiting 20 tablet 12  . ONETOUCH DELICA  LANCETS 99991111 MISC USE UTD UP TO QID  1  . ONETOUCH VERIO test strip     . pantoprazole (PROTONIX) 40 MG tablet Take 1 tab in the morning before  Breakfast. 30 tablet 11  . pioglitazone (ACTOS) 45 MG tablet Take 45 mg by mouth daily.  3  . SUMAtriptan (IMITREX) 100 MG tablet Take 1 tablet (100 mg total) by mouth once as needed for migraine. May repeat in 2 hours if headache persists or recurs. 10 tablet 12  . Testosterone POWD Testosterone 4% cream 30 Gram Tube Apply a tiny pea sized amount once daily as directed. 30 g 0  . topiramate (TOPAMAX) 25 MG tablet Take 4 tablets (100 mg total) by mouth at bedtime. Titrate as disussed. 120 tablet 12  . traMADol-acetaminophen (ULTRACET) 37.5-325 MG tablet Take 1 tablet by mouth every 8 (eight) hours as needed. 90 tablet 5  . VICTOZA 18 MG/3ML SOPN ADM 1.8 MG [place] QD  0  . Vilazodone HCl (VIIBRYD) 40 MG TABS Take 40 mg by mouth daily.     . VOLTAREN 1 % GEL APPLY 2 GRAMS TO KNEE 2-3 TIMES DAILY PRN  1   No facility-administered medications prior to visit.     ROS See HPI  Objective:  BP 126/78 (BP Location: Left Arm, Patient Position: Sitting, Cuff Size: Normal)   Pulse (!) 113   Temp 98 F (36.7 C) (Oral)   Ht 5\' 2"  (1.575 m)   Wt 150 lb (68 kg)   SpO2 96%   BMI 27.44 kg/m   BP Readings from Last 3 Encounters:  05/07/16 126/78  04/11/16 135/70  04/10/16 118/80    Wt Readings from Last 3 Encounters:  05/07/16 150 lb (68 kg)  04/11/16 153 lb 6.4 oz (69.6 kg)  04/10/16 152 lb (68.9 kg)    Physical Exam  Constitutional: She is oriented to person, place, and time. No distress.  Cardiovascular: Normal heart sounds.   Pulmonary/Chest: Effort normal.  Abdominal: Soft. Bowel sounds are normal. There is tenderness.  Diffuse tenderness.  Musculoskeletal: She exhibits no edema.  Neurological: She is alert and oriented to person, place, and time.  Skin: Skin is warm and dry.  Vitals reviewed.   Lab Results  Component Value Date   WBC  11.6 (H) 05/07/2016   HGB 11.1 (L) 05/07/2016   HCT 33.4 (L) 05/07/2016   PLT 470.0 (H) 05/07/2016   GLUCOSE 178 (H) 05/07/2016   CHOL 119 05/10/2014   TRIG 77.0 05/10/2014   HDL 54.30 05/10/2014   LDLCALC 49 05/10/2014   ALT 17 06/26/2015   AST 20 06/26/2015   NA 136 05/07/2016   K 3.5 05/07/2016   CL 97 05/07/2016   CREATININE 1.13 05/07/2016   BUN 14 05/07/2016   CO2 33 (H) 05/07/2016   TSH 1.26 08/07/2010   INR 0.93 11/02/2013   HGBA1C 6.8 (H) 03/29/2015  MICROALBUR 0.2 11/07/2006    Mm Screening Breast Tomo Bilateral  Result Date: 04/10/2016 CLINICAL DATA:  Screening. EXAM: 2D DIGITAL SCREENING BILATERAL MAMMOGRAM WITH CAD AND ADJUNCT TOMO COMPARISON:  Previous exam(s). ACR Breast Density Category b: There are scattered areas of fibroglandular density. FINDINGS: There are no findings suspicious for malignancy. Images were processed with CAD. IMPRESSION: No mammographic evidence of malignancy. A result letter of this screening mammogram will be mailed directly to the patient. RECOMMENDATION: Screening mammogram in one year. (Code:SM-B-01Y) BI-RADS CATEGORY  1: Negative. Electronically Signed   By: Franki Cabot M.D.   On: 04/10/2016 15:15    Assessment & Plan:   [place] was seen today for cough.  Diagnoses and all orders for this visit:  Dehydration -     Cancel: POCT urine pregnancy -     POCT Glucose (CBG) -     IFOBT POC (occult bld, rslt in office) -     Basic Metabolic Panel (BMET); Future -     POCT Urinalysis Dipstick  Diarrhea, unspecified type -     Cancel: POCT urine pregnancy -     POCT Glucose (CBG) -     IFOBT POC (occult bld, rslt in office) -     Basic Metabolic Panel (BMET); Future -     POCT Urinalysis Dipstick -     Stool C-Diff Toxin Assay; Future -     Stool Culture; Future -     CBC w/Diff; Future  DM type 2, controlled, with complication (HCC) -     POCT Glucose (CBG)   I am having Ms. Rames maintain her b complex vitamins,  (multivitamin,tx-minerals), metFORMIN, insulin glargine, cetirizine, DEPLIN, calcium-vitamin D, aspirin EC, Vilazodone HCl, BD PEN NEEDLE NANO U/F, ONETOUCH DELICA LANCETS 99991111, VOLTAREN, pioglitazone, linaclotide, pantoprazole, VICTOZA, LORazepam, ONETOUCH VERIO, furosemide, busPIRone, traMADol-acetaminophen, Liniments (SALONPAS PAIN RELIEF PATCH EX), Testosterone, lovastatin, fluconazole, conjugated estrogens, estradiol, topiramate, SUMAtriptan, ondansetron, and dicyclomine.  No orders of the defined types were placed in this encounter.  Follow-up: Return if symptoms worsen or fail to improve.  Wilfred Lacy, NP

## 2016-05-07 NOTE — Progress Notes (Signed)
Pre visit review using our clinic review tool, if applicable. No additional management support is needed unless otherwise documented below in the visit note. 

## 2016-05-07 NOTE — Patient Instructions (Addendum)
Use Zofran for nausea as prescribed. Hold metformin and Victoza until symptoms resolve. Check blood sugar 2-3times a day (before breakfast and at bedtime) Goal to hospital if symptoms worsen over the next 72 hours. Push oral hydration (water, Gatorade, Pedialyte) Maintain clear liquid diet for 24 hours, then advance as tolerated; started with Brat diet) Return stool samples to lab as soon as possible. Stopped use of any oral antibiotics.  May use Robitussin DM over-the-counter for cough and congestion. May use Chloraseptic lozenges over-the-counter for sore Throat .  May use Pepto-Bismol for diarrhea. May also use probiotic once a day to help improve diarrhea. Do not use any Imodium.

## 2016-05-07 NOTE — Progress Notes (Signed)
Reviewed with patient in office. See office note

## 2016-05-08 ENCOUNTER — Other Ambulatory Visit: Payer: PPO

## 2016-05-08 ENCOUNTER — Other Ambulatory Visit: Payer: Self-pay | Admitting: Nurse Practitioner

## 2016-05-08 DIAGNOSIS — R197 Diarrhea, unspecified: Secondary | ICD-10-CM

## 2016-05-09 LAB — C. DIFFICILE GDH AND TOXIN A/B
C. DIFF TOXIN A/B: NOT DETECTED
C. DIFFICILE GDH: NOT DETECTED

## 2016-05-09 NOTE — Progress Notes (Signed)
Normal results, see office note

## 2016-05-12 LAB — STOOL CULTURE

## 2016-05-13 NOTE — Progress Notes (Signed)
Normal results, see office note

## 2016-05-14 ENCOUNTER — Other Ambulatory Visit (HOSPITAL_COMMUNITY): Payer: PPO

## 2016-05-15 DIAGNOSIS — E1165 Type 2 diabetes mellitus with hyperglycemia: Secondary | ICD-10-CM | POA: Diagnosis not present

## 2016-05-15 DIAGNOSIS — E7800 Pure hypercholesterolemia, unspecified: Secondary | ICD-10-CM | POA: Diagnosis not present

## 2016-05-16 ENCOUNTER — Ambulatory Visit (INDEPENDENT_AMBULATORY_CARE_PROVIDER_SITE_OTHER)
Admission: RE | Admit: 2016-05-16 | Discharge: 2016-05-16 | Disposition: A | Discharge status: Home / Self Care | Payer: PPO | Source: Ambulatory Visit | Attending: Internal Medicine | Admitting: Internal Medicine

## 2016-05-16 ENCOUNTER — Ambulatory Visit (INDEPENDENT_AMBULATORY_CARE_PROVIDER_SITE_OTHER): Payer: PPO | Admitting: Internal Medicine

## 2016-05-16 ENCOUNTER — Encounter: Payer: Self-pay | Admitting: Internal Medicine

## 2016-05-16 DIAGNOSIS — I119 Hypertensive heart disease without heart failure: Secondary | ICD-10-CM

## 2016-05-16 DIAGNOSIS — R5382 Chronic fatigue, unspecified: Secondary | ICD-10-CM

## 2016-05-16 DIAGNOSIS — F05 Delirium due to known physiological condition: Secondary | ICD-10-CM | POA: Insufficient documentation

## 2016-05-16 DIAGNOSIS — Z794 Long term (current) use of insulin: Secondary | ICD-10-CM | POA: Diagnosis not present

## 2016-05-16 DIAGNOSIS — J069 Acute upper respiratory infection, unspecified: Secondary | ICD-10-CM

## 2016-05-16 DIAGNOSIS — E7800 Pure hypercholesterolemia, unspecified: Secondary | ICD-10-CM | POA: Diagnosis not present

## 2016-05-16 DIAGNOSIS — G43709 Chronic migraine without aura, not intractable, without status migrainosus: Secondary | ICD-10-CM

## 2016-05-16 DIAGNOSIS — R05 Cough: Secondary | ICD-10-CM | POA: Diagnosis not present

## 2016-05-16 DIAGNOSIS — E1165 Type 2 diabetes mellitus with hyperglycemia: Secondary | ICD-10-CM | POA: Diagnosis not present

## 2016-05-16 MED ORDER — FLUTICASONE FUROATE-VILANTEROL 100-25 MCG/INH IN AEPB: 1.0000 | INHALATION_SPRAY | Freq: Every day | RESPIRATORY_TRACT | 5 refills | Status: DC

## 2016-05-16 MED ORDER — LEVOFLOXACIN 500 MG PO TABS: 500.0000 mg | ORAL_TABLET | Freq: Every day | ORAL | 0 refills | Status: AC

## 2016-05-16 MED ORDER — CEFTRIAXONE SODIUM 1 G IJ SOLR
1.0000 g | Freq: Once | INTRAMUSCULAR | Status: AC
  Administered 2016-05-16: 1 g via INTRAMUSCULAR

## 2016-05-16 NOTE — Assessment & Plan Note (Signed)
CXR

## 2016-05-16 NOTE — Assessment & Plan Note (Signed)
Chronic - worse now CXR Treat URI

## 2016-05-16 NOTE — Progress Notes (Signed)
Subjective:  Patient ID: [name], female    DOB: 1941-03-14  Age: 75 y.o. MRN: [id number]  CC: No chief complaint on file.   HPI [name] presents for URI illness since Labour day. Not better: tired, coughing, dizzy... C/o confusion and "seeing people" x 2 days; she was confused and did not sleep x 2 nights. Better now; not confused. Topamax is a new medicine - started 4 weeks ago  Outpatient Medications Prior to Visit  Medication Sig Dispense Refill  . aspirin EC 81 MG tablet Take 1 tablet (81 mg total) by mouth daily. 100 tablet 3  . b complex vitamins tablet Take 2 tablets by mouth daily. Vitamin supplement    . BD PEN NEEDLE NANO U/F 32G X 4 MM MISC USE AS DIRECTED TWICE DAILY.  3  . busPIRone (BUSPAR) 15 MG tablet Take 1 tablet by mouth 2 (two) times daily.    . calcium-vitamin D (OSCAL WITH D) 500-200 MG-UNIT per tablet Take 1 tablet by mouth daily with breakfast.    . cetirizine (ZYRTEC) 10 MG tablet Take 10 mg by mouth daily as needed for allergies.     Marland Kitchen conjugated estrogens (PREMARIN) vaginal cream Place 1 Applicatorful vaginally 2 (two) times a week. 42.5 g 4  . dicyclomine (BENTYL) 10 MG capsule TAKE 1 CAPSULE (10 MG TOTAL) BY MOUTH 3 (THREE) TIMES DAILY BEFORE MEALS. 270 capsule 1  . estradiol (CLIMARA - DOSED IN MG/24 HR) 0.025 mg/24hr patch Place 1 patch (0.025 mg total) onto the skin once a week. APPLY 1 PATCH ONTO SKIN ONCE WEEKLY 12 patch 4  . fluconazole (DIFLUCAN) 200 MG tablet Take as needed every third day 15 tablet 3  . furosemide (LASIX) 20 MG tablet TAKE 1 TO 2 TABLETS BY MOUTH EVERY DAY (Patient taking differently: TAKE 1 TABLET BY MOUTH EVERY DAY) 60 tablet 5  . insulin glargine (LANTUS) 100 UNIT/ML injection Inject into the skin every evening. 8 OR 10 UNITS    . L-Methylfolate (DEPLIN) 15 MG TABS Take 15 mg by mouth daily. For treatment of depression    . Linaclotide (LINZESS) 145 MCG CAPS capsule Take 1 capsule (145 mcg total) by mouth daily.  (Patient taking differently: Take 145 mcg by mouth daily as needed. ) 30 capsule 11  . Liniments (SALONPAS PAIN RELIEF PATCH EX) Apply 1 patch topically at bedtime.    Marland Kitchen LORazepam (ATIVAN) 1 MG tablet Take 1 mg by mouth 4 (four) times daily as needed for anxiety.   2  . lovastatin (MEVACOR) 20 MG tablet TAKE 1 TABLET BY MOUTH EVERY DAY **NEED OV** (Patient taking differently: 1 TAB EVERY OTHER DAY) 90 tablet 3  . Multiple Vitamins-Minerals (MULTIVITAMIN,TX-MINERALS) tablet Take 1 tablet by mouth daily.     . ondansetron (ZOFRAN ODT) 4 MG disintegrating tablet Take 1-2 tablet (4-8 mg total) by mouth every 8 (eight) hours as needed for nausea or vomiting 20 tablet 12  . ONETOUCH DELICA LANCETS 99991111 MISC USE UTD UP TO QID  1  . ONETOUCH VERIO test strip     . pantoprazole (PROTONIX) 40 MG tablet Take 1 tab in the morning before  Breakfast. 30 tablet 11  . pioglitazone (ACTOS) 45 MG tablet Take 45 mg by mouth daily.  3  . SUMAtriptan (IMITREX) 100 MG tablet Take 1 tablet (100 mg total) by mouth once as needed for migraine. May repeat in 2 hours if headache persists or recurs. 10 tablet 12  . Testosterone POWD  Testosterone 4% cream 30 Gram Tube Apply a tiny pea sized amount once daily as directed. 30 g 0  . topiramate (TOPAMAX) 25 MG tablet Take 4 tablets (100 mg total) by mouth at bedtime. Titrate as disussed. 120 tablet 12  . traMADol-acetaminophen (ULTRACET) 37.5-325 MG tablet Take 1 tablet by mouth every 8 (eight) hours as needed. 90 tablet 5  . Vilazodone HCl (VIIBRYD) 40 MG TABS Take 40 mg by mouth daily.     . VOLTAREN 1 % GEL APPLY 2 GRAMS TO KNEE 2-3 TIMES DAILY PRN  1  . metFORMIN (GLUCOPHAGE-XR) 500 MG 24 hr tablet Take 1,000 mg by mouth 2 (two) times daily. For DM type 2    . VICTOZA 18 MG/3ML SOPN ADM 1.8 MG [place] QD  0   No facility-administered medications prior to visit.     ROS Review of Systems  Constitutional: Positive for fatigue. Negative for activity change, appetite change,  chills and unexpected weight change.  HENT: Positive for sinus pressure and sore throat. Negative for congestion and mouth sores.   Eyes: Negative for visual disturbance.  Respiratory: Positive for cough. Negative for chest tightness.   Cardiovascular: Negative for chest pain.  Gastrointestinal: Negative for abdominal pain, diarrhea, nausea and vomiting.  Genitourinary: Negative for difficulty urinating, frequency and vaginal pain.  Musculoskeletal: Positive for back pain and gait problem.  Skin: Negative for pallor and rash.  Neurological: Positive for dizziness, weakness and light-headedness. Negative for tremors, numbness and headaches.  Hematological: Does not bruise/bleed easily.  Psychiatric/Behavioral: Positive for confusion, decreased concentration, hallucinations and sleep disturbance. Negative for agitation and suicidal ideas. The patient is nervous/anxious.     Objective:  BP 118/62   Pulse 86   Temp 98.2 F (36.8 C) (Oral)   Wt 151 lb (68.5 kg)   SpO2 95%   BMI 27.62 kg/m   BP Readings from Last 3 Encounters:  05/16/16 118/62  05/07/16 126/78  04/11/16 135/70    Wt Readings from Last 3 Encounters:  05/16/16 151 lb (68.5 kg)  05/07/16 150 lb (68 kg)  04/11/16 153 lb 6.4 oz (69.6 kg)    Physical Exam  Constitutional: She appears well-developed. No distress.  HENT:  Head: Normocephalic.  Right Ear: External ear normal.  Left Ear: External ear normal.  Nose: Nose normal.  Mouth/Throat: Oropharynx is clear and moist.  Eyes: Conjunctivae are normal. Pupils are equal, round, and reactive to light. Right eye exhibits no discharge. Left eye exhibits no discharge.  Neck: Normal range of motion. Neck supple. No JVD present. No tracheal deviation present. No thyromegaly present.  Cardiovascular: Normal rate, regular rhythm and normal heart sounds.   Pulmonary/Chest: No stridor. No respiratory distress. She has no wheezes.  Abdominal: Soft. Bowel sounds are normal. She  exhibits no distension and no mass. There is no tenderness. There is no rebound and no guarding.  Musculoskeletal: She exhibits no edema or tenderness.  Lymphadenopathy:    She has no cervical adenopathy.  Neurological: She displays normal reflexes. No cranial nerve deficit. She exhibits normal muscle tone. Coordination abnormal.  Skin: No rash noted. No erythema.  Psychiatric: She has a normal mood and affect. Her behavior is normal. Thought content normal.   Coughing Mild ataxia Neuro - nonfocal Alert and cooperative Oriented x 2   I personally provided Breo inhaler use teaching. After the teaching patient was able to demonstrate it's use effectively. All questions were answered  Lab Results  Component Value Date   WBC 11.6 (  H) 05/07/2016   HGB 11.1 (L) 05/07/2016   HCT 33.4 (L) 05/07/2016   PLT 470.0 (H) 05/07/2016   GLUCOSE 178 (H) 05/07/2016   CHOL 119 05/10/2014   TRIG 77.0 05/10/2014   HDL 54.30 05/10/2014   LDLCALC 49 05/10/2014   ALT 17 06/26/2015   AST 20 06/26/2015   NA 136 05/07/2016   K 3.5 05/07/2016   CL 97 05/07/2016   CREATININE 1.13 05/07/2016   BUN 14 05/07/2016   CO2 33 (H) 05/07/2016   TSH 1.26 08/07/2010   INR 0.93 11/02/2013   HGBA1C 6.8 (H) 03/29/2015   MICROALBUR 0.2 11/07/2006    Mm Screening Breast Tomo Bilateral  Result Date: 04/10/2016 CLINICAL DATA:  Screening. EXAM: 2D DIGITAL SCREENING BILATERAL MAMMOGRAM WITH CAD AND ADJUNCT TOMO COMPARISON:  Previous exam(s). ACR Breast Density Category b: There are scattered areas of fibroglandular density. FINDINGS: There are no findings suspicious for malignancy. Images were processed with CAD. IMPRESSION: No mammographic evidence of malignancy. A result letter of this screening mammogram will be mailed directly to the patient. RECOMMENDATION: Screening mammogram in one year. (Code:SM-B-01Y) BI-RADS CATEGORY  1: Negative. Electronically Signed   By: Franki Cabot M.D.   On: 04/10/2016 15:15     Assessment & Plan:   There are no diagnoses linked to this encounter. I am having Ms. Crigger maintain her b complex vitamins, (multivitamin,tx-minerals), metFORMIN, insulin glargine, cetirizine, DEPLIN, calcium-vitamin D, aspirin EC, Vilazodone HCl, BD PEN NEEDLE NANO U/F, ONETOUCH DELICA LANCETS 99991111, VOLTAREN, pioglitazone, linaclotide, pantoprazole, VICTOZA, LORazepam, ONETOUCH VERIO, furosemide, busPIRone, traMADol-acetaminophen, Liniments (SALONPAS PAIN RELIEF PATCH EX), Testosterone, lovastatin, fluconazole, conjugated estrogens, estradiol, topiramate, SUMAtriptan, ondansetron, and dicyclomine.  No orders of the defined types were placed in this encounter.    Follow-up: No Follow-up on file.  Walker Kehr, MD

## 2016-05-16 NOTE — Assessment & Plan Note (Signed)
9/18-8/21/2017 ?etiology Head CT No evidence of meds overdose, no head injury No evidence of CVA  Stop Topamax F/u w/Dr Toy Care and dr Jaynee Eagles

## 2016-05-16 NOTE — Addendum Note (Signed)
Addended by: Elta Guadeloupe on: 05/16/2016 01:14 PM   Modules accepted: Orders

## 2016-05-16 NOTE — Assessment & Plan Note (Signed)
Stop Topamax at the moment due to acute confusion

## 2016-05-16 NOTE — Progress Notes (Signed)
Pre visit review using our clinic review tool, if applicable. No additional management support is needed unless otherwise documented below in the visit note. 

## 2016-05-16 NOTE — Patient Instructions (Addendum)
Stop Topamax F/u with Dr Toy Care and Dr Jaynee Eagles Go to ER if you are confused

## 2016-05-16 NOTE — Assessment & Plan Note (Addendum)
R/o CAP Start Levaquin x 10 d tomorrow am Rocephin 1 g IM now CXR  To ER if worse

## 2016-05-20 ENCOUNTER — Ambulatory Visit (INDEPENDENT_AMBULATORY_CARE_PROVIDER_SITE_OTHER)
Admission: RE | Admit: 2016-05-20 | Discharge: 2016-05-20 | Disposition: A | Discharge status: Home / Self Care | Payer: PPO | Source: Ambulatory Visit | Attending: Internal Medicine | Admitting: Internal Medicine

## 2016-05-20 ENCOUNTER — Ambulatory Visit: Admit: 2016-05-20 | Payer: PPO | Admitting: Orthopedic Surgery

## 2016-05-20 DIAGNOSIS — R51 Headache: Secondary | ICD-10-CM | POA: Diagnosis not present

## 2016-05-20 DIAGNOSIS — F05 Delirium due to known physiological condition: Secondary | ICD-10-CM | POA: Diagnosis not present

## 2016-05-20 SURGERY — ARTHROTOMY
Anesthesia: Spinal | Site: Knee | Laterality: Right

## 2016-05-21 DIAGNOSIS — G4733 Obstructive sleep apnea (adult) (pediatric): Secondary | ICD-10-CM | POA: Diagnosis not present

## 2016-05-22 ENCOUNTER — Encounter: Payer: Self-pay | Admitting: Neurology

## 2016-05-27 ENCOUNTER — Encounter: Payer: Self-pay | Admitting: Internal Medicine

## 2016-05-27 ENCOUNTER — Ambulatory Visit (INDEPENDENT_AMBULATORY_CARE_PROVIDER_SITE_OTHER): Payer: PPO | Admitting: Internal Medicine

## 2016-05-27 DIAGNOSIS — F05 Delirium due to known physiological condition: Secondary | ICD-10-CM | POA: Diagnosis not present

## 2016-05-27 DIAGNOSIS — J189 Pneumonia, unspecified organism: Secondary | ICD-10-CM | POA: Diagnosis not present

## 2016-05-27 DIAGNOSIS — E118 Type 2 diabetes mellitus with unspecified complications: Secondary | ICD-10-CM

## 2016-05-27 DIAGNOSIS — Z23 Encounter for immunization: Secondary | ICD-10-CM | POA: Diagnosis not present

## 2016-05-27 DIAGNOSIS — G4733 Obstructive sleep apnea (adult) (pediatric): Secondary | ICD-10-CM

## 2016-05-27 DIAGNOSIS — Z9989 Dependence on other enabling machines and devices: Secondary | ICD-10-CM

## 2016-05-27 MED ORDER — FLUTICASONE FUROATE-VILANTEROL 100-25 MCG/INH IN AEPB: 1.0000 | INHALATION_SPRAY | Freq: Every day | RESPIRATORY_TRACT | 5 refills | Status: DC

## 2016-05-27 NOTE — Assessment & Plan Note (Addendum)
likely Topamax and CAP related - resolved Brain CT w/atrophy

## 2016-05-27 NOTE — Assessment & Plan Note (Signed)
Ultracet prn  Potential benefits of a long term Tramadol/opioids use as well as potential risks (i.e. addiction risk, apnea etc) and complications (i.e. Somnolence, constipation and others) were explained to the patient and were aknowledged. McKenzie back pillow

## 2016-05-27 NOTE — Progress Notes (Signed)
Pre visit review using our clinic review tool, if applicable. No additional management support is needed unless otherwise documented below in the visit note. 

## 2016-05-27 NOTE — Progress Notes (Signed)
Subjective:  Patient ID: [name], female    DOB: [birth year]  Age: 75 y.o. MRN: [id number]  CC: No chief complaint on file.   HPI [name] presents for CAP and confusion - doing well. F/u DM  Outpatient Medications Prior to Visit  Medication Sig Dispense Refill  . aspirin EC 81 MG tablet Take 1 tablet (81 mg total) by mouth daily. 100 tablet 3  . b complex vitamins tablet Take 2 tablets by mouth daily. Vitamin supplement    . BD PEN NEEDLE NANO U/F 32G X 4 MM MISC USE AS DIRECTED TWICE DAILY.  3  . busPIRone (BUSPAR) 15 MG tablet Take 1 tablet by mouth 2 (two) times daily.    . calcium-vitamin D (OSCAL WITH D) 500-200 MG-UNIT per tablet Take 1 tablet by mouth daily with breakfast.    . cetirizine (ZYRTEC) 10 MG tablet Take 10 mg by mouth daily as needed for allergies.     Marland Kitchen conjugated estrogens (PREMARIN) vaginal cream Place 1 Applicatorful vaginally 2 (two) times a week. 42.5 g 4  . dicyclomine (BENTYL) 10 MG capsule TAKE 1 CAPSULE (10 MG TOTAL) BY MOUTH 3 (THREE) TIMES DAILY BEFORE MEALS. 270 capsule 1  . estradiol (CLIMARA - DOSED IN MG/24 HR) 0.025 mg/24hr patch Place 1 patch (0.025 mg total) onto the skin once a week. APPLY 1 PATCH ONTO SKIN ONCE WEEKLY 12 patch 4  . fluconazole (DIFLUCAN) 200 MG tablet Take as needed every third day 15 tablet 3  . fluticasone furoate-vilanterol (BREO ELLIPTA) 100-25 MCG/INH AEPB Inhale 1 puff into the lungs daily. 1 each 5  . furosemide (LASIX) 20 MG tablet TAKE 1 TO 2 TABLETS BY MOUTH EVERY DAY (Patient taking differently: TAKE 1 TABLET BY MOUTH EVERY DAY) 60 tablet 5  . insulin glargine (LANTUS) 100 UNIT/ML injection Inject into the skin every evening. 8 OR 10 UNITS    . L-Methylfolate (DEPLIN) 15 MG TABS Take 15 mg by mouth daily. For treatment of depression    . Linaclotide (LINZESS) 145 MCG CAPS capsule Take 1 capsule (145 mcg total) by mouth daily. (Patient taking differently: Take 145 mcg by mouth daily as needed. ) 30 capsule 11    . Liniments (SALONPAS PAIN RELIEF PATCH EX) Apply 1 patch topically at bedtime.    Marland Kitchen LORazepam (ATIVAN) 1 MG tablet Take 1 mg by mouth 4 (four) times daily as needed for anxiety.   2  . lovastatin (MEVACOR) 20 MG tablet TAKE 1 TABLET BY MOUTH EVERY DAY **NEED OV** (Patient taking differently: 1 TAB EVERY OTHER DAY) 90 tablet 3  . metFORMIN (GLUCOPHAGE-XR) 500 MG 24 hr tablet Take 1,000 mg by mouth 2 (two) times daily. For DM type 2    . Multiple Vitamins-Minerals (MULTIVITAMIN,TX-MINERALS) tablet Take 1 tablet by mouth daily.     . ondansetron (ZOFRAN ODT) 4 MG disintegrating tablet Take 1-2 tablet (4-8 mg total) by mouth every 8 (eight) hours as needed for nausea or vomiting 20 tablet 12  . ONETOUCH DELICA LANCETS 99991111 MISC USE UTD UP TO QID  1  . ONETOUCH VERIO test strip     . pantoprazole (PROTONIX) 40 MG tablet Take 1 tab in the morning before  Breakfast. 30 tablet 11  . pioglitazone (ACTOS) 45 MG tablet Take 45 mg by mouth daily.  3  . SUMAtriptan (IMITREX) 100 MG tablet Take 1 tablet (100 mg total) by mouth once as needed for migraine. May repeat in 2 hours if headache  persists or recurs. 10 tablet 12  . Testosterone POWD Testosterone 4% cream 30 Gram Tube Apply a tiny pea sized amount once daily as directed. 30 g 0  . traMADol-acetaminophen (ULTRACET) 37.5-325 MG tablet Take 1 tablet by mouth every 8 (eight) hours as needed. 90 tablet 5  . VICTOZA 18 MG/3ML SOPN ADM 1.8 MG [place] QD  0  . Vilazodone HCl (VIIBRYD) 40 MG TABS Take 40 mg by mouth daily.     . VOLTAREN 1 % GEL APPLY 2 GRAMS TO KNEE 2-3 TIMES DAILY PRN  1   No facility-administered medications prior to visit.     ROS Review of Systems  Constitutional: Negative for activity change, appetite change, chills, fatigue and unexpected weight change.  HENT: Negative for congestion, mouth sores and sinus pressure.   Eyes: Negative for visual disturbance.  Respiratory: Negative for cough and chest tightness.   Gastrointestinal:  Negative for abdominal pain and nausea.  Genitourinary: Negative for difficulty urinating, frequency and vaginal pain.  Musculoskeletal: Negative for back pain and gait problem.  Skin: Negative for pallor and rash.  Neurological: Negative for dizziness, tremors, weakness, numbness and headaches.  Psychiatric/Behavioral: Negative for confusion and sleep disturbance.    Objective:  BP 140/90   Pulse 93   Temp 97.5 F (36.4 C) (Oral)   Wt 156 lb (70.8 kg)   SpO2 95%   BMI 28.53 kg/m   BP Readings from Last 3 Encounters:  05/27/16 140/90  05/16/16 118/62  05/07/16 126/78    Wt Readings from Last 3 Encounters:  05/27/16 156 lb (70.8 kg)  05/16/16 151 lb (68.5 kg)  05/07/16 150 lb (68 kg)    Physical Exam  Constitutional: She appears well-developed. No distress.  HENT:  Head: Normocephalic.  Right Ear: External ear normal.  Left Ear: External ear normal.  Nose: Nose normal.  Mouth/Throat: Oropharynx is clear and moist.  Eyes: Conjunctivae are normal. Pupils are equal, round, and reactive to light. Right eye exhibits no discharge. Left eye exhibits no discharge.  Neck: Normal range of motion. Neck supple. No JVD present. No tracheal deviation present. No thyromegaly present.  Cardiovascular: Normal rate, regular rhythm and normal heart sounds.   Pulmonary/Chest: No stridor. No respiratory distress. She has no wheezes.  Abdominal: Soft. Bowel sounds are normal. She exhibits no distension and no mass. There is no tenderness. There is no rebound and no guarding.  Musculoskeletal: She exhibits no edema or tenderness.  Lymphadenopathy:    She has no cervical adenopathy.  Neurological: She displays normal reflexes. No cranial nerve deficit. She exhibits normal muscle tone. Coordination normal.  Skin: No rash noted. No erythema.  Psychiatric: She has a normal mood and affect. Her behavior is normal. Judgment and thought content normal.  a/o/c  Lab Results  Component Value Date     WBC 11.6 (H) 05/07/2016   HGB 11.1 (L) 05/07/2016   HCT 33.4 (L) 05/07/2016   PLT 470.0 (H) 05/07/2016   GLUCOSE 178 (H) 05/07/2016   CHOL 119 05/10/2014   TRIG 77.0 05/10/2014   HDL 54.30 05/10/2014   LDLCALC 49 05/10/2014   ALT 17 06/26/2015   AST 20 06/26/2015   NA 136 05/07/2016   K 3.5 05/07/2016   CL 97 05/07/2016   CREATININE 1.13 05/07/2016   BUN 14 05/07/2016   CO2 33 (H) 05/07/2016   TSH 1.26 08/07/2010   INR 0.93 11/02/2013   HGBA1C 6.8 (H) 03/29/2015   MICROALBUR 0.2 11/07/2006    Ct  Head Wo Contrast  Result Date: 05/20/2016 CLINICAL DATA:  Confusion x3 days, concern for TIA. Headache, dizziness, visual disturbance. EXAM: CT HEAD WITHOUT CONTRAST TECHNIQUE: Contiguous axial images were obtained from the base of the skull through the vertex without intravenous contrast. COMPARISON:  09/16/2012 FINDINGS: Brain: No evidence of acute infarction, hemorrhage, hydrocephalus, extra-axial collection or mass lesion/mass effect. Vascular: No hyperdense vessel or unexpected calcification. Skull: Normal. Negative for fracture or focal lesion. Sinuses/Orbits: The visualized paranasal sinuses are essentially clear. The mastoid air cells are unopacified. Other: Global cortical atrophy.  No ventriculomegaly. Subcortical white matter and periventricular small vessel ischemic changes. IMPRESSION: No evidence of acute intracranial abnormality. Atrophy with small vessel ischemic changes. Electronically Signed   By: Julian Hy M.D.   On: 05/20/2016 15:47    Assessment & Plan:   There are no diagnoses linked to this encounter. I am having Ms. Ehlert maintain her b complex vitamins, (multivitamin,tx-minerals), metFORMIN, insulin glargine, cetirizine, DEPLIN, calcium-vitamin D, aspirin EC, Vilazodone HCl, BD PEN NEEDLE NANO U/F, ONETOUCH DELICA LANCETS 99991111, VOLTAREN, pioglitazone, linaclotide, pantoprazole, VICTOZA, LORazepam, ONETOUCH VERIO, furosemide, busPIRone, traMADol-acetaminophen,  Liniments (SALONPAS PAIN RELIEF PATCH EX), Testosterone, lovastatin, fluconazole, conjugated estrogens, estradiol, SUMAtriptan, ondansetron, dicyclomine, and fluticasone furoate-vilanterol.  No orders of the defined types were placed in this encounter.    Follow-up: No Follow-up on file.  Walker Kehr, MD

## 2016-05-27 NOTE — Patient Instructions (Signed)
[place] back pillow 

## 2016-05-28 DIAGNOSIS — J189 Pneumonia, unspecified organism: Secondary | ICD-10-CM | POA: Insufficient documentation

## 2016-05-28 NOTE — Assessment & Plan Note (Signed)
Resolving

## 2016-05-28 NOTE — Assessment & Plan Note (Signed)
Doing well 

## 2016-05-28 NOTE — Assessment & Plan Note (Signed)
Victoza, Metformin, Lantus  

## 2016-05-30 DIAGNOSIS — G4733 Obstructive sleep apnea (adult) (pediatric): Secondary | ICD-10-CM | POA: Diagnosis not present

## 2016-06-03 DIAGNOSIS — J3089 Other allergic rhinitis: Secondary | ICD-10-CM | POA: Diagnosis not present

## 2016-06-03 DIAGNOSIS — J301 Allergic rhinitis due to pollen: Secondary | ICD-10-CM | POA: Diagnosis not present

## 2016-06-05 ENCOUNTER — Ambulatory Visit: Payer: PPO | Admitting: Internal Medicine

## 2016-06-07 DIAGNOSIS — J3089 Other allergic rhinitis: Secondary | ICD-10-CM | POA: Diagnosis not present

## 2016-06-07 DIAGNOSIS — J301 Allergic rhinitis due to pollen: Secondary | ICD-10-CM | POA: Diagnosis not present

## 2016-06-12 ENCOUNTER — Ambulatory Visit (INDEPENDENT_AMBULATORY_CARE_PROVIDER_SITE_OTHER): Payer: PPO | Admitting: Neurology

## 2016-06-12 ENCOUNTER — Encounter: Payer: Self-pay | Admitting: Neurology

## 2016-06-12 VITALS — BP 138/70 | HR 82 | Ht 62.0 in | Wt 151.0 lb

## 2016-06-12 DIAGNOSIS — G43709 Chronic migraine without aura, not intractable, without status migrainosus: Secondary | ICD-10-CM | POA: Diagnosis not present

## 2016-06-12 DIAGNOSIS — G4733 Obstructive sleep apnea (adult) (pediatric): Secondary | ICD-10-CM

## 2016-06-12 DIAGNOSIS — J301 Allergic rhinitis due to pollen: Secondary | ICD-10-CM | POA: Diagnosis not present

## 2016-06-12 DIAGNOSIS — J3089 Other allergic rhinitis: Secondary | ICD-10-CM | POA: Diagnosis not present

## 2016-06-12 DIAGNOSIS — Z9989 Dependence on other enabling machines and devices: Secondary | ICD-10-CM | POA: Diagnosis not present
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
## 2016-06-12 NOTE — Patient Instructions (Addendum)
Please continue using your CPAP regularly. While your insurance requires that you use CPAP at least 4 hours each night on 70% of the nights, I recommend, that you not skip any nights and use it throughout the night if you can. Getting used to CPAP and staying with the treatment long term does take time and patience and discipline. Untreated obstructive sleep apnea when it is moderate to severe can have an adverse impact on cardiovascular health and raise her risk for heart disease, arrhythmias, hypertension, congestive heart failure, stroke and diabetes. Untreated obstructive sleep apnea causes sleep disruption, nonrestorative sleep, and sleep deprivation. This can have an impact on your day to day functioning and cause daytime sleepiness and impairment of cognitive function, memory loss, mood disturbance, and problems focussing. Using CPAP regularly can improve these symptoms.  Keep up the good work! I will see you back in 6 months for sleep apnea check up, and if you continue to do well on CPAP I will see you once a year thereafter.   Please try to keep the CPAP on in the early morning hours, when your sleep apnea may be worse (during REM/dream sleep).

## 2016-06-18 ENCOUNTER — Telehealth: Payer: Self-pay | Admitting: *Deleted

## 2016-06-18 ENCOUNTER — Telehealth: Payer: Self-pay | Admitting: Gastroenterology

## 2016-06-18 DIAGNOSIS — J3089 Other allergic rhinitis: Secondary | ICD-10-CM | POA: Diagnosis not present

## 2016-06-18 DIAGNOSIS — J301 Allergic rhinitis due to pollen: Secondary | ICD-10-CM | POA: Diagnosis not present

## 2016-06-18 NOTE — Telephone Encounter (Signed)
Patient reports "queezy feeling" for the last few days.  She is advised to try zofran listed on her medication list.  She will call back for an office visit with PA if her symptoms don't resolve by next week

## 2016-06-18 NOTE — Telephone Encounter (Signed)
Called and LVM for pt to call and r/s f/u on 11/20. Dr Jaynee Eagles is working in hospital that day.   Can offer new patient slots on 11/21 or 11/22 per Dr Jaynee Eagles.  **Please r/s if pt calls, thank you

## 2016-06-19 ENCOUNTER — Encounter: Payer: Self-pay | Admitting: Neurology

## 2016-06-20 NOTE — Telephone Encounter (Signed)
Per appt note by CB at check out, she cx appt and sent pt letter.

## 2016-06-25 ENCOUNTER — Encounter: Payer: Self-pay | Admitting: Neurology

## 2016-06-25 DIAGNOSIS — H43811 Vitreous degeneration, right eye: Secondary | ICD-10-CM | POA: Diagnosis not present

## 2016-06-25 DIAGNOSIS — H35342 Macular cyst, hole, or pseudohole, left eye: Secondary | ICD-10-CM | POA: Diagnosis not present

## 2016-06-25 DIAGNOSIS — Z961 Presence of intraocular lens: Secondary | ICD-10-CM | POA: Diagnosis not present

## 2016-06-25 DIAGNOSIS — H26491 Other secondary cataract, right eye: Secondary | ICD-10-CM | POA: Diagnosis not present

## 2016-06-25 DIAGNOSIS — E1139 Type 2 diabetes mellitus with other diabetic ophthalmic complication: Secondary | ICD-10-CM | POA: Diagnosis not present

## 2016-06-25 LAB — HM DIABETES EYE EXAM

## 2016-06-27 ENCOUNTER — Other Ambulatory Visit: Payer: Self-pay | Admitting: Internal Medicine

## 2016-06-30 DIAGNOSIS — G4733 Obstructive sleep apnea (adult) (pediatric): Secondary | ICD-10-CM | POA: Diagnosis not present

## 2016-07-01 DIAGNOSIS — J301 Allergic rhinitis due to pollen: Secondary | ICD-10-CM | POA: Diagnosis not present

## 2016-07-01 DIAGNOSIS — J3089 Other allergic rhinitis: Secondary | ICD-10-CM | POA: Diagnosis not present

## 2016-07-04 ENCOUNTER — Other Ambulatory Visit (INDEPENDENT_AMBULATORY_CARE_PROVIDER_SITE_OTHER): Payer: PPO

## 2016-07-04 ENCOUNTER — Ambulatory Visit (INDEPENDENT_AMBULATORY_CARE_PROVIDER_SITE_OTHER): Payer: PPO | Admitting: Physician Assistant

## 2016-07-04 ENCOUNTER — Encounter: Payer: Self-pay | Admitting: Physician Assistant

## 2016-07-04 VITALS — BP 118/72 | HR 104 | Ht 62.0 in | Wt 153.0 lb

## 2016-07-04 DIAGNOSIS — K219 Gastro-esophageal reflux disease without esophagitis: Secondary | ICD-10-CM

## 2016-07-04 DIAGNOSIS — K5909 Other constipation: Secondary | ICD-10-CM

## 2016-07-04 DIAGNOSIS — R110 Nausea: Secondary | ICD-10-CM

## 2016-07-04 LAB — CBC WITH DIFFERENTIAL/PLATELET
BASOS PCT: 1.3 % (ref 0.0–3.0)
Basophils Absolute: 0.1 10*3/uL (ref 0.0–0.1)
EOS ABS: 0.2 10*3/uL (ref 0.0–0.7)
Eosinophils Relative: 2.5 % (ref 0.0–5.0)
HEMATOCRIT: 29.4 % — AB (ref 36.0–46.0)
Hemoglobin: 9.7 g/dL — ABNORMAL LOW (ref 12.0–15.0)
LYMPHS PCT: 31.4 % (ref 12.0–46.0)
Lymphs Abs: 2.7 10*3/uL (ref 0.7–4.0)
MCHC: 33.1 g/dL (ref 30.0–36.0)
MCV: 91.7 fl (ref 78.0–100.0)
MONOS PCT: 8.6 % (ref 3.0–12.0)
Monocytes Absolute: 0.7 10*3/uL (ref 0.1–1.0)
NEUTROS ABS: 4.8 10*3/uL (ref 1.4–7.7)
Neutrophils Relative %: 56.2 % (ref 43.0–77.0)
PLATELETS: 490 10*3/uL — AB (ref 150.0–400.0)
RBC: 3.21 Mil/uL — ABNORMAL LOW (ref 3.87–5.11)
RDW: 15.9 % — AB (ref 11.5–15.5)
WBC: 8.6 10*3/uL (ref 4.0–10.5)

## 2016-07-04 LAB — COMPREHENSIVE METABOLIC PANEL
ALT: 11 U/L (ref 0–35)
AST: 20 U/L (ref 0–37)
Albumin: 4 g/dL (ref 3.5–5.2)
Alkaline Phosphatase: 57 U/L (ref 39–117)
BUN: 13 mg/dL (ref 6–23)
CALCIUM: 9.9 mg/dL (ref 8.4–10.5)
CHLORIDE: 100 meq/L (ref 96–112)
CO2: 32 meq/L (ref 19–32)
Creatinine, Ser: 1.13 mg/dL (ref 0.40–1.20)
GFR: 49.91 mL/min — AB (ref >60.00)
Glucose, Bld: 159 mg/dL — ABNORMAL HIGH (ref 70–99)
Potassium: 4.5 mEq/L (ref 3.5–5.1)
Sodium: 136 mEq/L (ref 135–145)
Total Bilirubin: 0.2 mg/dL (ref 0.2–1.2)
Total Protein: 6.8 g/dL (ref 6.0–8.3)

## 2016-07-04 MED ORDER — FAMOTIDINE 40 MG PO TABS: ORAL_TABLET | ORAL | 6 refills | Status: DC

## 2016-07-04 MED ORDER — HYOSCYAMINE SULFATE SL 0.125 MG SL SUBL: SUBLINGUAL_TABLET | SUBLINGUAL | 3 refills | Status: DC

## 2016-07-04 NOTE — Progress Notes (Signed)
Reviewed and agree with management plan.  Bert Givans T. Klair Leising, MD FACG 

## 2016-07-04 NOTE — Patient Instructions (Addendum)
Please go to the basement level to have your labs drawn.   Stop Advil. Use Tramadol for headache as needed.  Continue Protonix 40 mg, 1 tab by mouth every morning. We sent prescriptions to [facility].  1. Pepcid 40 mg. .2 Levsin sublingual. ( instead of Bentyl).    You have been scheduled for an abdominal ultrasound at [facility] (1st floor of hospital) on 07-11-2016 at 11:30 am. Please arrive at 11:15  to your appointment for registration. Make certain not to have anything to eat or drink 6 hours prior to your appointment. Should you need to reschedule your appointment, please contact radiology at [phone]. This test typically takes about 30 minutes to perform.;

## 2016-07-04 NOTE — Progress Notes (Signed)
Subjective:    Patient ID: [name], female    DOB: [birth year], 75 y.o.   MRN: [id number]  HPI [name] is a very nice 75 year old white female known to [name]. Her PCP is [name] cough. She has history of chronic GERD, chronic constipation, IBS and history of serrated adenomatous colon polyp. She had undergone recent colonoscopy in April 2017 for follow-up of adenomatous colon polyps and this was a normal exam. Last EGD done in October 2014 showed small hiatal hernia otherwise negative exam. Other medical problems include chronic migraines, adult-onset diabetes mellitus, anxiety,  obstructive sleep apnea for which she uses CPAP.  She is status post cholecystectomy, and splenectomy. Patient comes in today with complaints of "queasiness" over the past couple of weeks. She has not vomited. She says she's having intermittent nausea but does not present every day. She has not been able to figure out a pattern. Doesn't necessarily seem to be related to eating. Her appetite is been okay her weight has been stable. She continues to have constipation and intermittent abdominal cramping which she has had for a long time. She is on Protonix 40 mg by mouth daily and says she still has problems with reflux and indigestion particularly later in the day. On  further questioning she has been taking Advil on a daily basis usually at least 4, for  headaches. Patient is also concerned because she needs a refill on dicyclomine and was told by her pharmacy that this is on backorder. Patient would like to know if there is no alternative.  Review of Systems Pertinent positive and negative review of systems were noted in the above HPI section.  All other review of systems was otherwise negative.  Outpatient Encounter Prescriptions as of 07/04/2016  Medication Sig  . aspirin EC 81 MG tablet Take 1 tablet (81 mg total) by mouth daily.  Marland Kitchen b complex vitamins tablet Take 2 tablets by mouth daily. Vitamin  supplement  . BD PEN NEEDLE NANO U/F 32G X 4 MM MISC USE AS DIRECTED TWICE DAILY.  . busPIRone (BUSPAR) 15 MG tablet Take 1 tablet by mouth 2 (two) times daily.  . calcium-vitamin D (OSCAL WITH D) 500-200 MG-UNIT per tablet Take 1 tablet by mouth daily with breakfast.  . cetirizine (ZYRTEC) 10 MG tablet Take 10 mg by mouth daily as needed for allergies.   Marland Kitchen conjugated estrogens (PREMARIN) vaginal cream Place 1 Applicatorful vaginally 2 (two) times a week.  . dicyclomine (BENTYL) 10 MG capsule TAKE 1 CAPSULE (10 MG TOTAL) BY MOUTH 3 (THREE) TIMES DAILY BEFORE MEALS.  Marland Kitchen estradiol (CLIMARA - DOSED IN MG/24 HR) 0.025 mg/24hr patch Place 1 patch (0.025 mg total) onto the skin once a week. APPLY 1 PATCH ONTO SKIN ONCE WEEKLY  . fluconazole (DIFLUCAN) 200 MG tablet Take as needed every third day  . furosemide (LASIX) 20 MG tablet TAKE 1 TO 2 TABLETS BY MOUTH EVERY DAY (Patient taking differently: TAKE 1 TABLET BY MOUTH EVERY DAY)  . insulin glargine (LANTUS) 100 UNIT/ML injection Inject into the skin every evening. 8 OR 10 UNITS  . L-Methylfolate (DEPLIN) 15 MG TABS Take 15 mg by mouth daily. For treatment of depression  . Linaclotide (LINZESS) 145 MCG CAPS capsule Take 1 capsule (145 mcg total) by mouth daily. (Patient taking differently: Take 145 mcg by mouth daily as needed. )  . Liniments (SALONPAS PAIN RELIEF PATCH EX) Apply 1 patch topically at bedtime.  Marland Kitchen LORazepam (ATIVAN) 1 MG tablet Take  1 mg by mouth 4 (four) times daily as needed for anxiety.   . lovastatin (MEVACOR) 20 MG tablet TAKE 1 TABLET BY MOUTH EVERY DAY **NEED OV** (Patient taking differently: 1 TAB EVERY OTHER DAY)  . metFORMIN (GLUCOPHAGE-XR) 500 MG 24 hr tablet Take 1,000 mg by mouth 2 (two) times daily. For DM type 2  . Multiple Vitamins-Minerals (MULTIVITAMIN,TX-MINERALS) tablet Take 1 tablet by mouth daily.   . ondansetron (ZOFRAN ODT) 4 MG disintegrating tablet Take 1-2 tablet (4-8 mg total) by mouth every 8 (eight) hours as  needed for nausea or vomiting  . ONETOUCH DELICA LANCETS 99991111 MISC USE UTD UP TO QID  . ONETOUCH VERIO test strip   . pantoprazole (PROTONIX) 40 MG tablet Take 1 tab in the morning before  Breakfast.  . pioglitazone (ACTOS) 45 MG tablet Take 45 mg by mouth daily.  . SUMAtriptan (IMITREX) 100 MG tablet Take 1 tablet (100 mg total) by mouth once as needed for migraine. May repeat in 2 hours if headache persists or recurs.  . Testosterone POWD Testosterone 4% cream 30 Gram Tube Apply a tiny pea sized amount once daily as directed.  . traMADol-acetaminophen (ULTRACET) 37.5-325 MG tablet Take 1 tablet by mouth every 8 (eight) hours as needed. (Patient taking differently: Take 1 tablet by mouth at bedtime. )  . VICTOZA 18 MG/3ML SOPN ADM 1.8 MG [place] QD  . Vilazodone HCl (VIIBRYD) 40 MG TABS Take 40 mg by mouth daily.   . VOLTAREN 1 % GEL APPLY 2 GRAMS TO KNEE 2-3 TIMES DAILY PRN  . famotidine (PEPCID) 40 MG tablet Take 1 tablet daily before dinner.  . Hyoscyamine Sulfate SL (LEVSIN/SL) 0.125 MG SUBL Dissolve 1 tablet on the tongue every morning.  . [DISCONTINUED] BREO ELLIPTA 100-25 MCG/INH AEPB TAKE 1 PUFF INTO LUNGS DAILY   No facility-administered encounter medications on file as of 07/04/2016.    Allergies  Allergen Reactions  . Amlodipine     Dizzy, HA  . Celebrex [Celecoxib]     HAs  . Codeine   . Gabapentin     dizzy  . Hydrocodone     Feeling funny, nausea  . Meloxicam     jittery  . Pravastatin Sodium   . Topamax [Topiramate]     hallucinations   Patient Active Problem List   Diagnosis Date Noted  . CAP (community acquired pneumonia) 05/28/2016  . Acute confusional state 05/16/2016  . Chronic migraine w/o aura w/o status migrainosus, not intractable 04/13/2016  . OSA on CPAP 01/30/2016  . Acute URI 10/17/2015  . Rash 07/12/2015  . SI (sacroiliac) pain 03/30/2015  . Sciatica of right side 08/08/2014  . Acute blood loss anemia 03/09/2014  . DJD (degenerative joint disease)  of knee 11/10/2013  . Unspecified constipation 10/29/2013  . Obesity, unspecified 08/04/2013  . Osteoarthritis of right knee 08/04/2013  . Edema 05/11/2013  . Fall 09/16/2012  . Neck pain 09/16/2012  . Low back pain 09/16/2012  . Well adult exam 07/01/2012  . Cerumen impaction 08/07/2011  . Acute bronchitis 05/21/2011  . Fatigue 08/07/2010  . PARESTHESIA 08/07/2010  . DYSPHAGIA 07/13/2009  . Irritable bowel syndrome 09/24/2007  . LIVER FUNCTION TESTS, ABNORMAL 09/24/2007  . DM type 2, controlled, with complication ([place]) 99991111  . Anxiety state 06/26/2007  . Depression 06/26/2007  . 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 heart failure 06/26/2007  . GERD 06/26/2007  . OSTEOARTHRITIS 06/26/2007  . COLONIC POLYPS, HX OF 06/26/2007  . SPLENECTOMY, TOTAL, HX OF  06/26/2007   Social History   Social History  . Marital status: Married    Spouse name: Patrick Jupiter  . Number of children: 3  . Years of education: 57   Occupational History  . Retired Retired   Social History Main Topics  . Smoking status: Never Smoker  . Smokeless tobacco: Never Used  . Alcohol use No  . Drug use: No  . Sexual activity: Yes    Birth control/ protection: Surgical, Post-menopausal     Comment: HYST, INTERCOURSE AGE 31, SEXUAL PARTNERS LESS THAN 5   Other Topics Concern  . Not on file   Social History Narrative   Daily Caffeine Use:  Rarely   Regular Exercise -  YES [facility] [employer]   Married with 3 sons          Ms. Senters's family history includes Breast cancer (age of onset: 60) in her sister; Colon cancer (age of onset: 63) in her mother; Colon polyps in her mother; Diabetes in her father.      Objective:    Vitals:   07/04/16 1109  BP: 118/72  Pulse: (!) 104    Physical Exam  well-developed older white female in no acute distress, pleasant blood pressure 118/72 pulse 104, BMI 27.9. HEENT; nontraumatic normocephalic EOMI PERRLA sclera anicteric, Cardiovascular;  regular rate and rhythm with S1-S2 no murmur or gallop, Pulmonary ;clear bilaterally, Abdomen; soft and tender nondistended bowel sounds are active there is no palpable mass or hepatosplenomegaly, Rectal ;exam not done, Extremities ;no clubbing cyanosis or edema skin warm and dry, Neuropsych ;mood and affect appropriate       Assessment & Owen:   #48 75 year old white female with 2-3 week history of nausea intermittent and "queasiness". Rule out NSAID-induced gastropathy, rule out possible gastroparesis #2 chronic GERD-not well-controlled on current regimen #3 history of serrated adenomatous colon polyp, up-to-date with colonoscopy last exam April 2017 normal #4 IBS #5 obstructive sleep apnea # 6 adult-onset diabetes mellitus #7 chronic migraines  Owen; continue Protonix 40 mg by mouth every morning Add Pepcid or milligrams by mouth before meals dinner Check CBC and CMET  Schedule upper abdominal ultrasound She  was given a prescription for Levsin 0.125 mg 1 by mouth every 6 hours when necessary in place of dicyclomine She is asked to stop all NSAID use . She does have a prescription for tramadol and we'll use that as needed for headaches.  Natalye Kott S Allea Kassner PA-C 07/04/2016   Cc: Plotnikov, Evie Lacks, MD

## 2016-07-05 ENCOUNTER — Telehealth: Payer: Self-pay | Admitting: Physician Assistant

## 2016-07-05 NOTE — Telephone Encounter (Signed)
The provider, Nicoletta Ba PA has signed the office visit now. We do not need to fax the order.

## 2016-07-08 ENCOUNTER — Telehealth: Payer: Self-pay | Admitting: Physician Assistant

## 2016-07-08 ENCOUNTER — Other Ambulatory Visit: Payer: Self-pay | Admitting: Gynecology

## 2016-07-08 DIAGNOSIS — M8580 Other specified disorders of bone density and structure, unspecified site: Secondary | ICD-10-CM

## 2016-07-09 ENCOUNTER — Telehealth: Payer: Self-pay | Admitting: *Deleted

## 2016-07-09 ENCOUNTER — Ambulatory Visit (INDEPENDENT_AMBULATORY_CARE_PROVIDER_SITE_OTHER): Payer: PPO

## 2016-07-09 ENCOUNTER — Encounter: Payer: Self-pay | Admitting: Internal Medicine

## 2016-07-09 DIAGNOSIS — M8580 Other specified disorders of bone density and structure, unspecified site: Secondary | ICD-10-CM | POA: Diagnosis not present

## 2016-07-09 MED ORDER — TESTOSTERONE POWD: 0 refills | Status: DC

## 2016-07-09 NOTE — Telephone Encounter (Signed)
Pt informed with the below note, Rx called.

## 2016-07-09 NOTE — Telephone Encounter (Signed)
Pt called requesting refill testosterone 4%  cream, last filled in June. Please advise

## 2016-07-09 NOTE — Telephone Encounter (Signed)
Okay for refill, ask her out how often she uses? It looks as though she was given this in 2015.

## 2016-07-11 ENCOUNTER — Other Ambulatory Visit (INDEPENDENT_AMBULATORY_CARE_PROVIDER_SITE_OTHER): Payer: PPO

## 2016-07-11 ENCOUNTER — Other Ambulatory Visit: Payer: Self-pay

## 2016-07-11 ENCOUNTER — Ambulatory Visit (HOSPITAL_COMMUNITY)
Admission: RE | Admit: 2016-07-11 | Discharge: 2016-07-11 | Disposition: A | Discharge status: Home / Self Care | Payer: PPO | Source: Ambulatory Visit | Attending: Physician Assistant | Admitting: Physician Assistant

## 2016-07-11 ENCOUNTER — Telehealth: Payer: Self-pay | Admitting: Gynecology

## 2016-07-11 DIAGNOSIS — K5909 Other constipation: Secondary | ICD-10-CM

## 2016-07-11 DIAGNOSIS — D539 Nutritional anemia, unspecified: Secondary | ICD-10-CM

## 2016-07-11 DIAGNOSIS — J301 Allergic rhinitis due to pollen: Secondary | ICD-10-CM | POA: Diagnosis not present

## 2016-07-11 DIAGNOSIS — J3089 Other allergic rhinitis: Secondary | ICD-10-CM | POA: Diagnosis not present

## 2016-07-11 DIAGNOSIS — Z9049 Acquired absence of other specified parts of digestive tract: Secondary | ICD-10-CM | POA: Diagnosis not present

## 2016-07-11 DIAGNOSIS — K219 Gastro-esophageal reflux disease without esophagitis: Secondary | ICD-10-CM | POA: Diagnosis not present

## 2016-07-11 DIAGNOSIS — R110 Nausea: Secondary | ICD-10-CM | POA: Insufficient documentation

## 2016-07-11 DIAGNOSIS — K5900 Constipation, unspecified: Secondary | ICD-10-CM | POA: Diagnosis not present

## 2016-07-11 LAB — VITAMIN B12: VITAMIN B 12: 441 pg/mL (ref 211–911)

## 2016-07-11 LAB — FERRITIN: FERRITIN: 8.6 ng/mL — AB (ref 10.0–291.0)

## 2016-07-11 LAB — FOLATE

## 2016-07-11 NOTE — Telephone Encounter (Signed)
Pt informed with the below, she will call back to schedule appointment

## 2016-07-11 NOTE — Telephone Encounter (Signed)
Tell patient her bone density does show a fair amount of loss since her prior study. There is an indication to treat her with medication to prevent fracture risk. Recommend office visit to discuss treatment options.

## 2016-07-12 LAB — IRON AND TIBC
%SAT: 13 % (ref 11–50)
Iron: 51 ug/dL (ref 45–160)
TIBC: 398 ug/dL (ref 250–450)
UIBC: 347 ug/dL (ref 125–400)

## 2016-07-15 ENCOUNTER — Ambulatory Visit: Payer: PPO | Admitting: Neurology

## 2016-07-16 ENCOUNTER — Encounter: Payer: Self-pay | Admitting: Physician Assistant

## 2016-07-23 ENCOUNTER — Other Ambulatory Visit: Payer: Self-pay

## 2016-07-23 MED ORDER — POLYSACCHARIDE IRON COMPLEX 150 MG PO CAPS: 150.0000 mg | ORAL_CAPSULE | Freq: Two times a day (BID) | ORAL | 3 refills | Status: DC

## 2016-07-29 ENCOUNTER — Other Ambulatory Visit: Payer: Self-pay | Admitting: Internal Medicine

## 2016-07-30 DIAGNOSIS — G4733 Obstructive sleep apnea (adult) (pediatric): Secondary | ICD-10-CM | POA: Diagnosis not present

## 2016-08-01 NOTE — Telephone Encounter (Deleted)
Done

## 2016-08-01 NOTE — Telephone Encounter (Signed)
Done

## 2016-08-08 ENCOUNTER — Telehealth: Payer: Self-pay | Admitting: Neurology

## 2016-08-08 NOTE — Telephone Encounter (Signed)
Dr Ahern- please advise 

## 2016-08-08 NOTE — Telephone Encounter (Signed)
Patient needs to discuss her  CPAP and headaches she is having. A home health nurse was at her home and suggested the pressure may be too high because there is an air noise.

## 2016-08-08 NOTE — Telephone Encounter (Signed)
I spoke to pt. Pt reports that she is waking up with headaches on the days that she has used her cpap. She says that she went off of cpap for four days, and she still had a headache, but it was not as bad as the days she used her cpap. Pt has tried aleve and tramadol for her headache but reports that they were ineffective. She contacted her DME, [facility], and they sent a respiratory therapist to help the pt. Pt reports that the therapist confirmed that she was using the mask correctly but suggested an auto-titration because the pt thinks that the pressure is too high because she hears air leaking from her mask.  Pt wants to know what Dr. Rexene Alberts suggests regarding these problems.  Of note, pt is also a pt of Dr. Cathren Laine for headaches. Pt says that she has received a migraine infusion before from Dr. Jaynee Eagles and it helped, but she doesn't have time to set another one up during the holidays. Pt is leaving town on Tuesday and will not be back until after the first of the year.  I have pulled a compliance and therapy report for this pt and have put it on Dr. Guadelupe Sabin desk.

## 2016-08-08 NOTE — Telephone Encounter (Signed)
I reviewed her CPAP compliance data from 07/05/2016 through 08/03/2016 which is a total of 30 days, during which time she used her machine 29 days with percent used days greater than 4 hours at 87%, indicating very good compliance, average usage of 6 hours and 39 minutes, residual AHI borderline at 6.5 per hour, leak on the high side for the 95th percentile at 26.8 L/m on a pressure of 6 cm with EPR. I would not recommend placing patient on AutoPap and if anything we may have to increase pressure rather than decrease pressure. I'm not convinced that her headaches are secondary to CPAP usage, it may be best to discuss optimizing of headache management with Dr. Jaynee Eagles at the next appointment next month. I do believe she would benefit from working with her DME company regarding mask refit and to try to reduce her air leakage. From my end of things, would like to keep her on the current pressure settings but recommended she meet with her DME provider regarding mask issues and keep her appointment with Dr. Jaynee Eagles to talk about residual headache issues. Please call patient to relay back information. thx

## 2016-08-08 NOTE — Telephone Encounter (Signed)
I called pt and advised her that Dr. Rexene Alberts reviewed pt's cpap compliance report and does not recommend an auto titration. Dr. Rexene Alberts is not convinced that pt's headaches are related to her cpap suage and recommends that pt discuss headaches with Dr. Jaynee Eagles at the scheduled office visit next month.   I encouraged pt to call [facility] and discuss a mask refit to hopefully alleviate some of the air leakage. Dr. Rexene Alberts will keep pt on the current pressure settings. Pt verbalized understanding.   Pt says that she would like me to send a message to [facility] and Dr. Jaynee Eagles regarding her headaches. Pt wants Dr. Jaynee Eagles to know that she reacted to the topamax and she does not take it any longer. Pt says that she received a migraine infusion in the past and it helped but she is going out of town for the holidays and does not think she will be able to fit it in right now. (please see my earlier documentation regarding pt's headaches earlier in the phone conversation.) Pt wants Dr. Cathren Laine suggestions on her current headache, since Dr. Rexene Alberts does not think it is related her her cpap usage.

## 2016-08-12 DIAGNOSIS — G43711 Chronic migraine without aura, intractable, with status migrainosus: Secondary | ICD-10-CM | POA: Insufficient documentation

## 2016-08-12 MED ORDER — ATENOLOL 25 MG PO TABS: 25.0000 mg | ORAL_TABLET | Freq: Every day | ORAL | 6 refills | Status: DC

## 2016-08-12 NOTE — Addendum Note (Signed)
Addended by: Sarina Ill B on: 08/12/2016 07:37 PM   Modules accepted: Orders

## 2016-08-12 NOTE — Telephone Encounter (Signed)
Spoke to patient, see note addedndum from 03/2016.   [name], can you fill out a botox form for patient please? [phone].   Danielle, can you call patient and explain the botox approval protocol and other details please? thanks

## 2016-08-13 NOTE — Telephone Encounter (Signed)
Filled out botox form per AA,MD request.

## 2016-08-15 NOTE — Telephone Encounter (Signed)
I called the patient to discuss botox and get her scheduled. She did not answer so I left a VM asking her to give me a call back.

## 2016-08-27 ENCOUNTER — Telehealth: Payer: Self-pay | Admitting: Physician Assistant

## 2016-08-27 NOTE — Telephone Encounter (Signed)
Spoke with pt and let her know she is due for CBC next week, orders in epic.

## 2016-08-30 DIAGNOSIS — G4733 Obstructive sleep apnea (adult) (pediatric): Secondary | ICD-10-CM | POA: Diagnosis not present

## 2016-09-02 ENCOUNTER — Other Ambulatory Visit: Payer: Self-pay | Admitting: Gastroenterology

## 2016-09-02 DIAGNOSIS — K219 Gastro-esophageal reflux disease without esophagitis: Secondary | ICD-10-CM

## 2016-09-04 ENCOUNTER — Ambulatory Visit (INDEPENDENT_AMBULATORY_CARE_PROVIDER_SITE_OTHER): Payer: PPO | Admitting: Internal Medicine

## 2016-09-04 ENCOUNTER — Encounter: Payer: Self-pay | Admitting: Internal Medicine

## 2016-09-04 ENCOUNTER — Other Ambulatory Visit (INDEPENDENT_AMBULATORY_CARE_PROVIDER_SITE_OTHER): Payer: PPO

## 2016-09-04 DIAGNOSIS — E118 Type 2 diabetes mellitus with unspecified complications: Secondary | ICD-10-CM | POA: Diagnosis not present

## 2016-09-04 DIAGNOSIS — D649 Anemia, unspecified: Secondary | ICD-10-CM | POA: Insufficient documentation

## 2016-09-04 DIAGNOSIS — G8929 Other chronic pain: Secondary | ICD-10-CM

## 2016-09-04 DIAGNOSIS — D508 Other iron deficiency anemias: Secondary | ICD-10-CM

## 2016-09-04 DIAGNOSIS — M5440 Lumbago with sciatica, unspecified side: Secondary | ICD-10-CM

## 2016-09-04 DIAGNOSIS — E6609 Other obesity due to excess calories: Secondary | ICD-10-CM

## 2016-09-04 DIAGNOSIS — Z6830 Body mass index (BMI) 30.0-30.9, adult: Secondary | ICD-10-CM

## 2016-09-04 DIAGNOSIS — F3341 Major depressive disorder, recurrent, in partial remission: Secondary | ICD-10-CM

## 2016-09-04 DIAGNOSIS — D539 Nutritional anemia, unspecified: Secondary | ICD-10-CM | POA: Diagnosis not present

## 2016-09-04 DIAGNOSIS — J069 Acute upper respiratory infection, unspecified: Secondary | ICD-10-CM

## 2016-09-04 DIAGNOSIS — I119 Hypertensive heart disease without heart failure: Secondary | ICD-10-CM

## 2016-09-04 DIAGNOSIS — G43709 Chronic migraine without aura, not intractable, without status migrainosus: Secondary | ICD-10-CM

## 2016-09-04 DIAGNOSIS — F411 Generalized anxiety disorder: Secondary | ICD-10-CM | POA: Diagnosis not present

## 2016-09-04 DIAGNOSIS — R5382 Chronic fatigue, unspecified: Secondary | ICD-10-CM | POA: Diagnosis not present

## 2016-09-04 LAB — CBC WITH DIFFERENTIAL/PLATELET
BASOS PCT: 0.6 % (ref 0.0–3.0)
Basophils Absolute: 0 10*3/uL (ref 0.0–0.1)
EOS PCT: 5.4 % — AB (ref 0.0–5.0)
Eosinophils Absolute: 0.4 10*3/uL (ref 0.0–0.7)
HCT: 28.3 % — ABNORMAL LOW (ref 36.0–46.0)
Hemoglobin: 9.5 g/dL — ABNORMAL LOW (ref 12.0–15.0)
LYMPHS ABS: 3 10*3/uL (ref 0.7–4.0)
Lymphocytes Relative: 35.7 % (ref 12.0–46.0)
MCHC: 33.5 g/dL (ref 30.0–36.0)
MCV: 90.6 fl (ref 78.0–100.0)
MONO ABS: 0.6 10*3/uL (ref 0.1–1.0)
Monocytes Relative: 7.6 % (ref 3.0–12.0)
NEUTROS ABS: 4.2 10*3/uL (ref 1.4–7.7)
NEUTROS PCT: 50.7 % (ref 43.0–77.0)
Platelets: 456 10*3/uL — ABNORMAL HIGH (ref 150.0–400.0)
RBC: 3.12 Mil/uL — ABNORMAL LOW (ref 3.87–5.11)
RDW: 14.8 % (ref 11.5–15.5)
WBC: 8.3 10*3/uL (ref 4.0–10.5)

## 2016-09-04 LAB — URINALYSIS
BILIRUBIN URINE: NEGATIVE
HGB URINE DIPSTICK: NEGATIVE
Ketones, ur: NEGATIVE
Leukocytes, UA: NEGATIVE
Nitrite: NEGATIVE
Specific Gravity, Urine: 1.01 (ref 1.000–1.030)
TOTAL PROTEIN, URINE-UPE24: NEGATIVE
URINE GLUCOSE: NEGATIVE
Urobilinogen, UA: 0.2 (ref 0.0–1.0)
pH: 7 (ref 5.0–8.0)

## 2016-09-04 NOTE — Progress Notes (Signed)
Pre visit review using our clinic review tool, if applicable. No additional management support is needed unless otherwise documented below in the visit note. 

## 2016-09-04 NOTE — Assessment & Plan Note (Signed)
On Trintellix

## 2016-09-04 NOTE — Assessment & Plan Note (Signed)
Lantus, Victoza, Metformin

## 2016-09-04 NOTE — Assessment & Plan Note (Signed)
  You can hold lovastatin x 2-3 weeks You can reduce Tenormin to 1/2 tablet a day

## 2016-09-04 NOTE — Assessment & Plan Note (Addendum)
Ultracet ok to use it more often Considering to cont accupuncture

## 2016-09-04 NOTE — Assessment & Plan Note (Signed)
  We can reduce Tenormin to 1/2 tablet a day due to fatigue Considering Botox

## 2016-09-04 NOTE — Patient Instructions (Addendum)
For fatigue:  You can hold lovastatin x 2-3 weeks You can reduce Tenormin to 1/2 tablet a day   Ultracet  --- ok to use it more often: 1/2-1/2-1 tablet

## 2016-09-04 NOTE — Assessment & Plan Note (Addendum)
F/u w/GI May need IV iron

## 2016-09-04 NOTE — Progress Notes (Signed)
Subjective:  Patient ID: [name], female    DOB: 01-13-1941  Age: 76 y.o. MRN: [id number]  CC: No chief complaint on file.   HPI [place] presents for DM, HTN, LBP f/u C/o "cold-like" sx's x 1 week  Outpatient Medications Prior to Visit  Medication Sig Dispense Refill  . aspirin EC 81 MG tablet Take 1 tablet (81 mg total) by mouth daily. 100 tablet 3  . atenolol (TENORMIN) 25 MG tablet Take 1 tablet (25 mg total) by mouth daily. 30 tablet 6  . b complex vitamins tablet Take 2 tablets by mouth daily. Vitamin supplement    . BD PEN NEEDLE NANO U/F 32G X 4 MM MISC USE AS DIRECTED TWICE DAILY.  3  . busPIRone (BUSPAR) 15 MG tablet Take 1 tablet by mouth 2 (two) times daily.    . calcium-vitamin D (OSCAL WITH D) 500-200 MG-UNIT per tablet Take 1 tablet by mouth daily with breakfast.    . cetirizine (ZYRTEC) 10 MG tablet Take 10 mg by mouth daily as needed for allergies.     Marland Kitchen conjugated estrogens (PREMARIN) vaginal cream Place 1 Applicatorful vaginally 2 (two) times a week. 42.5 g 4  . dicyclomine (BENTYL) 10 MG capsule TAKE 1 CAPSULE (10 MG TOTAL) BY MOUTH 3 (THREE) TIMES DAILY BEFORE MEALS. 270 capsule 1  . estradiol (CLIMARA - DOSED IN MG/24 HR) 0.025 mg/24hr patch Place 1 patch (0.025 mg total) onto the skin once a week. APPLY 1 PATCH ONTO SKIN ONCE WEEKLY 12 patch 4  . famotidine (PEPCID) 40 MG tablet Take 1 tablet daily before dinner. 30 tablet 6  . fluconazole (DIFLUCAN) 200 MG tablet Take as needed every third day 15 tablet 3  . furosemide (LASIX) 20 MG tablet TAKE 1 TO 2 TABLETS BY MOUTH EVERY DAY (Patient taking differently: TAKE 1 TABLET BY MOUTH EVERY DAY) 60 tablet 5  . insulin glargine (LANTUS) 100 UNIT/ML injection Inject into the skin every evening. 8 OR 10 UNITS    . iron polysaccharides (NIFEREX) 150 MG capsule Take 1 capsule (150 mg total) by mouth 2 (two) times daily. 60 capsule 3  . L-Methylfolate (DEPLIN) 15 MG TABS Take 15 mg by mouth daily. For treatment  of depression    . Linaclotide (LINZESS) 145 MCG CAPS capsule Take 1 capsule (145 mcg total) by mouth daily. (Patient taking differently: Take 145 mcg by mouth daily as needed. ) 30 capsule 11  . Liniments (SALONPAS PAIN RELIEF PATCH EX) Apply 1 patch topically at bedtime.    Marland Kitchen LORazepam (ATIVAN) 1 MG tablet Take 1 mg by mouth 4 (four) times daily as needed for anxiety.   2  . lovastatin (MEVACOR) 20 MG tablet TAKE 1 TABLET BY MOUTH EVERY DAY **NEED OV** (Patient taking differently: 1 TAB EVERY OTHER DAY) 90 tablet 3  . metFORMIN (GLUCOPHAGE-XR) 500 MG 24 hr tablet Take 1,000 mg by mouth 2 (two) times daily. For DM type 2    . Multiple Vitamins-Minerals (MULTIVITAMIN,TX-MINERALS) tablet Take 1 tablet by mouth daily.     Glory Rosebush DELICA LANCETS [id number] MISC USE UTD UP TO QID  1  . ONETOUCH VERIO test strip     . pioglitazone (ACTOS) 45 MG tablet Take 45 mg by mouth daily.  3  . Testosterone POWD Testosterone 4% cream 30 Gram Tube Apply a tiny pea sized amount once daily as directed. 30 g 0  . traMADol-acetaminophen (ULTRACET) 37.5-325 MG tablet TAKE 1 TABLET BY MOUTH EVERY  8 HOURS AS NEEDED 90 tablet 3  . VICTOZA 18 MG/3ML SOPN ADM 1.8 MG [place] QD  0  . VOLTAREN 1 % GEL APPLY 2 GRAMS TO KNEE 2-3 TIMES DAILY PRN  1  . Hyoscyamine Sulfate SL (LEVSIN/SL) 0.125 MG SUBL Dissolve 1 tablet on the tongue every morning. (Patient not taking: Reported on 09/04/2016) 30 each 3  . ondansetron (ZOFRAN ODT) 4 MG disintegrating tablet Take 1-2 tablet (4-8 mg total) by mouth every 8 (eight) hours as needed for nausea or vomiting (Patient not taking: Reported on 09/04/2016) 20 tablet 12  . pantoprazole (PROTONIX) 40 MG tablet TAKE 1 TABLET BY MOUTH TWICE A DAY (Patient not taking: Reported on 09/04/2016) 180 tablet 1  . SUMAtriptan (IMITREX) 100 MG tablet Take 1 tablet (100 mg total) by mouth once as needed for migraine. May repeat in 2 hours if headache persists or recurs. (Patient not taking: Reported on 09/04/2016) 10  tablet 12  . Vilazodone HCl (VIIBRYD) 40 MG TABS Take 40 mg by mouth daily.     . pantoprazole (PROTONIX) 40 MG tablet Take 1 tab in the morning before  Breakfast. (Patient not taking: Reported on 09/04/2016) 30 tablet 11   No facility-administered medications prior to visit.     ROS Review of Systems  Constitutional: Positive for chills and fatigue. Negative for activity change, appetite change, diaphoresis, fever and unexpected weight change.  HENT: Negative for congestion, ear pain, facial swelling, hearing loss, mouth sores, nosebleeds, postnasal drip, rhinorrhea, sinus pressure, sneezing, sore throat, tinnitus and trouble swallowing.   Eyes: Negative for pain, discharge, redness, itching and visual disturbance.  Respiratory: Negative for cough, chest tightness, shortness of breath, wheezing and stridor.   Cardiovascular: Negative for chest pain, palpitations and leg swelling.  Gastrointestinal: Negative for abdominal distention, anal bleeding, blood in stool, constipation, diarrhea, nausea and rectal pain.  Genitourinary: Negative for difficulty urinating, dysuria, flank pain, frequency, genital sores, hematuria, pelvic pain, urgency, vaginal bleeding and vaginal discharge.  Musculoskeletal: Positive for arthralgias and back pain. Negative for gait problem, joint swelling, neck pain and neck stiffness.  Skin: Negative.  Negative for rash.  Neurological: Negative for dizziness, tremors, seizures, syncope, speech difficulty, weakness, numbness and headaches.  Hematological: Negative for adenopathy. Does not bruise/bleed easily.  Psychiatric/Behavioral: Negative for behavioral problems, decreased concentration, dysphoric mood, sleep disturbance and suicidal ideas. The patient is not nervous/anxious.     Objective:  BP 130/80   Pulse 81   Temp 98.3 F (36.8 C) (Oral)   Wt 153 lb (69.4 kg)   SpO2 97%   BMI 27.98 kg/m   BP Readings from Last 3 Encounters:  09/04/16 130/80  07/04/16  118/72  06/12/16 138/70    Wt Readings from Last 3 Encounters:  09/04/16 153 lb (69.4 kg)  07/04/16 153 lb (69.4 kg)  06/12/16 151 lb (68.5 kg)    Physical Exam  Constitutional: She appears well-developed. No distress.  HENT:  Head: Normocephalic.  Right Ear: External ear normal.  Left Ear: External ear normal.  Nose: Nose normal.  Mouth/Throat: Oropharynx is clear and moist.  Eyes: Conjunctivae are normal. Pupils are equal, round, and reactive to light. Right eye exhibits no discharge. Left eye exhibits no discharge.  Neck: Normal range of motion. Neck supple. No JVD present. No tracheal deviation present. No thyromegaly present.  Cardiovascular: Normal rate, regular rhythm and normal heart sounds.   Pulmonary/Chest: No stridor. No respiratory distress. She has no wheezes.  Abdominal: Soft. Bowel sounds are normal.  She exhibits no distension and no mass. There is no tenderness. There is no rebound and no guarding.  Musculoskeletal: She exhibits tenderness. She exhibits no edema.  Lymphadenopathy:    She has no cervical adenopathy.  Neurological: She displays normal reflexes. No cranial nerve deficit. She exhibits normal muscle tone. Coordination abnormal.  Skin: No rash noted. No erythema.  Psychiatric: She has a normal mood and affect. Her behavior is normal. Judgment and thought content normal.  LS tender  Lab Results  Component Value Date   WBC 8.6 07/04/2016   HGB 9.7 (L) 07/04/2016   HCT 29.4 (L) 07/04/2016   PLT 490.0 (H) 07/04/2016   GLUCOSE 159 (H) 07/04/2016   CHOL 119 05/10/2014   TRIG 77.0 05/10/2014   HDL 54.30 05/10/2014   LDLCALC 49 05/10/2014   ALT 11 07/04/2016   AST 20 07/04/2016   NA 136 07/04/2016   K 4.5 07/04/2016   CL 100 07/04/2016   CREATININE 1.13 07/04/2016   BUN 13 07/04/2016   CO2 32 07/04/2016   TSH 1.26 08/07/2010   INR 0.93 11/02/2013   HGBA1C 6.8 (H) 03/29/2015   MICROALBUR 0.2 11/07/2006    US Abdomen Complete  Result Date:  07/11/2016 CLINICAL DATA:  Nausea and vomiting. Constipation. GERD. Cholecystectomy. EXAM: ABDOMEN ULTRASOUND COMPLETE COMPARISON:  Ultrasound abdomen 09/29/2007 FINDINGS: Gallbladder: Cholecystectomy Common bile duct: Diameter: 5.4 mm Liver: No focal lesion identified. Within normal limits in parenchymal echogenicity. IVC: No abnormality visualized. Pancreas: Visualized portion unremarkable. Spleen: Size and appearance within normal limits. Right Kidney: Length: 10.5 cm. Echogenicity within normal limits. No mass or hydronephrosis visualized. Left Kidney: Length: 10.4 cm. Echogenicity within normal limits. No mass or hydronephrosis visualized. Abdominal aorta: No aneurysm visualized. Other findings: None. IMPRESSION: Cholecystectomy.  No significant abnormality. Electronically Signed   By: Franchot Gallo M.D.   On: 07/11/2016 14:51    Assessment & Plan:   There are no diagnoses linked to this encounter. I am having Ms. Hasan maintain her b complex vitamins, (multivitamin,tx-minerals), metFORMIN, insulin glargine, cetirizine, DEPLIN, calcium-vitamin D, aspirin EC, Vilazodone HCl, BD PEN NEEDLE NANO U/F, ONETOUCH DELICA LANCETS 99991111, VOLTAREN, pioglitazone, linaclotide, VICTOZA, LORazepam, ONETOUCH VERIO, furosemide, busPIRone, Liniments (SALONPAS PAIN RELIEF PATCH EX), lovastatin, fluconazole, conjugated estrogens, estradiol, SUMAtriptan, ondansetron, dicyclomine, famotidine, Hyoscyamine Sulfate SL, Testosterone, iron polysaccharides, traMADol-acetaminophen, atenolol, pantoprazole, and TRINTELLIX.  Meds ordered this encounter  Medications  . TRINTELLIX 20 MG TABS    Sig: Take 20 mg by mouth daily.    Refill:  2     Follow-up: No Follow-up on file.  Walker Kehr, MD

## 2016-09-04 NOTE — Assessment & Plan Note (Signed)
Wt Readings from Last 3 Encounters:  09/04/16 153 lb (69.4 kg)  07/04/16 153 lb (69.4 kg)  06/12/16 151 lb (68.5 kg)

## 2016-09-04 NOTE — Assessment & Plan Note (Signed)
Prn Xanax

## 2016-09-04 NOTE — Assessment & Plan Note (Signed)
OTC meds 

## 2016-09-04 NOTE — Assessment & Plan Note (Signed)
Furosemide.

## 2016-09-05 ENCOUNTER — Encounter: Payer: Self-pay | Admitting: Internal Medicine

## 2016-09-06 ENCOUNTER — Telehealth: Payer: Self-pay

## 2016-09-06 NOTE — Telephone Encounter (Signed)
Patient stopped the oral iron after 1 month. She had developed "severe" constipation. Her Linzess "didn't touch it." She ask about an iron infusion instead. She states she had an infusion in the past. Please advise.

## 2016-09-06 NOTE — Telephone Encounter (Signed)
-----   Message from [name], [place] sent at 09/05/2016  2:44 PM EST ----- Please let pt know her hgb is a little lower , she still needs to be on iron supplement, and lets check hemocults , and repeat cbc in one month

## 2016-09-09 ENCOUNTER — Encounter: Payer: Self-pay | Admitting: Neurology

## 2016-09-09 ENCOUNTER — Other Ambulatory Visit: Payer: Self-pay | Admitting: Internal Medicine

## 2016-09-09 ENCOUNTER — Ambulatory Visit (INDEPENDENT_AMBULATORY_CARE_PROVIDER_SITE_OTHER): Payer: PPO | Admitting: Neurology

## 2016-09-09 VITALS — BP 120/71 | HR 82 | Wt 150.0 lb

## 2016-09-09 DIAGNOSIS — G43009 Migraine without aura, not intractable, without status migrainosus: Secondary | ICD-10-CM | POA: Diagnosis not present

## 2016-09-09 DIAGNOSIS — G43709 Chronic migraine without aura, not intractable, without status migrainosus: Secondary | ICD-10-CM

## 2016-09-09 DIAGNOSIS — D638 Anemia in other chronic diseases classified elsewhere: Secondary | ICD-10-CM
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
## 2016-09-09 NOTE — Patient Instructions (Signed)
Remember to drink plenty of fluid, eat healthy meals and do not skip any meals. Try to eat protein with a every meal and eat a healthy snack such as fruit or nuts in between meals. Try to keep a regular sleep-wake schedule and try to exercise daily, particularly in the form of walking, 20-30 minutes a day, if you can.   As far as your medications are concerned, I would like to suggest: Gabapentin (Neurontin) 100mg  three times a day  As far as diagnostic testing: Botox for migraines  I would like to see you back for botox for migraines, sooner if we need to. Please call us with any interim questions, concerns, problems, updates or refill requests.   Our phone number is [phone]. We also have an after hours call service for urgent matters and there is a physician on-call for urgent questions. For any emergencies you know to call 911 or go to the nearest emergency room

## 2016-09-10 ENCOUNTER — Other Ambulatory Visit: Payer: Self-pay

## 2016-09-10 DIAGNOSIS — D500 Iron deficiency anemia secondary to blood loss (chronic): Secondary | ICD-10-CM

## 2016-09-10 NOTE — Telephone Encounter (Signed)
My chart message to the patient and voicemail. DPR on file.

## 2016-09-10 NOTE — Telephone Encounter (Signed)
Would like to repeat ferritin, serum iron ,iron sat, TIBC- and have her do hemocults -then decide on course of action

## 2016-09-16 ENCOUNTER — Other Ambulatory Visit: Payer: Self-pay

## 2016-09-16 ENCOUNTER — Other Ambulatory Visit: Payer: PPO

## 2016-09-16 DIAGNOSIS — D508 Other iron deficiency anemias: Secondary | ICD-10-CM

## 2016-09-24 ENCOUNTER — Telehealth: Payer: Self-pay | Admitting: *Deleted

## 2016-09-24 ENCOUNTER — Ambulatory Visit (INDEPENDENT_AMBULATORY_CARE_PROVIDER_SITE_OTHER): Payer: PPO | Admitting: Neurology

## 2016-09-24 VITALS — BP 140/76 | HR 84 | Ht 62.0 in | Wt 152.6 lb

## 2016-09-24 DIAGNOSIS — G43709 Chronic migraine without aura, not intractable, without status migrainosus: Secondary | ICD-10-CM | POA: Diagnosis not present

## 2016-09-24 NOTE — Progress Notes (Signed)
Botox-100unitsx2 vials Lot: [id number] Expiration: 03/2019 NDC: [id number]  0.9% Sodium Chloride bacteriostatic- 55mL total Lot: [id number] Expiration: 01/24/2018 NDC: [id number]  Dx: [id number] B/B

## 2016-09-24 NOTE — Progress Notes (Signed)

## 2016-09-24 NOTE — Telephone Encounter (Signed)
Called and spoke to pt husband. Pt upstairs and he took message. Called to inform them AA,MD running behind this am and for them to check in at 12pm for 1230 appt. He verbalized understanding and agreeable to this.

## 2016-09-25 ENCOUNTER — Other Ambulatory Visit (INDEPENDENT_AMBULATORY_CARE_PROVIDER_SITE_OTHER): Payer: PPO

## 2016-09-25 DIAGNOSIS — D500 Iron deficiency anemia secondary to blood loss (chronic): Secondary | ICD-10-CM

## 2016-09-25 DIAGNOSIS — R21 Rash and other nonspecific skin eruption: Secondary | ICD-10-CM | POA: Diagnosis not present

## 2016-09-25 DIAGNOSIS — K219 Gastro-esophageal reflux disease without esophagitis: Secondary | ICD-10-CM | POA: Diagnosis not present

## 2016-09-25 DIAGNOSIS — J301 Allergic rhinitis due to pollen: Secondary | ICD-10-CM | POA: Diagnosis not present

## 2016-09-25 DIAGNOSIS — J3089 Other allergic rhinitis: Secondary | ICD-10-CM | POA: Diagnosis not present

## 2016-09-25 LAB — HEMOCCULT SLIDES (X 3 CARDS)
Fecal Occult Blood: NEGATIVE
OCCULT 1: NEGATIVE
OCCULT 2: NEGATIVE
OCCULT 3: NEGATIVE
OCCULT 4: NEGATIVE
OCCULT 5: NEGATIVE

## 2016-09-26 ENCOUNTER — Telehealth: Payer: Self-pay | Admitting: Neurology

## 2016-09-26 NOTE — Telephone Encounter (Signed)
Patient's husband is calling. The patient has an appointment for Botox on 12-24-16 but the insurance company says PA is only through 12-12-16. Please call and discuss.

## 2016-09-30 DIAGNOSIS — G4733 Obstructive sleep apnea (adult) (pediatric): Secondary | ICD-10-CM | POA: Diagnosis not present

## 2016-10-03 DIAGNOSIS — J3089 Other allergic rhinitis: Secondary | ICD-10-CM | POA: Diagnosis not present

## 2016-10-03 DIAGNOSIS — J301 Allergic rhinitis due to pollen: Secondary | ICD-10-CM | POA: Diagnosis not present

## 2016-10-06 DIAGNOSIS — G4733 Obstructive sleep apnea (adult) (pediatric): Secondary | ICD-10-CM | POA: Diagnosis not present

## 2016-10-08 NOTE — Telephone Encounter (Signed)
I called and left a VM asking for the patient to call me.

## 2016-10-10 NOTE — Telephone Encounter (Signed)
I spoke with the patients husband and informed him that we would apply for a new authorization before her next apt.

## 2016-10-11 ENCOUNTER — Ambulatory Visit: Payer: PPO

## 2016-10-11 ENCOUNTER — Other Ambulatory Visit (HOSPITAL_BASED_OUTPATIENT_CLINIC_OR_DEPARTMENT_OTHER): Payer: PPO

## 2016-10-11 ENCOUNTER — Ambulatory Visit (HOSPITAL_BASED_OUTPATIENT_CLINIC_OR_DEPARTMENT_OTHER): Payer: PPO | Admitting: Family

## 2016-10-11 VITALS — BP 130/62 | HR 92 | Temp 97.8°F | Resp 18 | Ht 62.0 in | Wt 153.8 lb

## 2016-10-11 DIAGNOSIS — N183 Chronic kidney disease, stage 3 (moderate): Secondary | ICD-10-CM

## 2016-10-11 DIAGNOSIS — N185 Chronic kidney disease, stage 5: Principal | ICD-10-CM

## 2016-10-11 DIAGNOSIS — D631 Anemia in chronic kidney disease: Secondary | ICD-10-CM

## 2016-10-11 DIAGNOSIS — D509 Iron deficiency anemia, unspecified: Secondary | ICD-10-CM

## 2016-10-11 DIAGNOSIS — J301 Allergic rhinitis due to pollen: Secondary | ICD-10-CM | POA: Diagnosis not present

## 2016-10-11 DIAGNOSIS — J3089 Other allergic rhinitis: Secondary | ICD-10-CM | POA: Diagnosis not present

## 2016-10-11 LAB — COMPREHENSIVE METABOLIC PANEL
ALBUMIN: 3.6 g/dL (ref 3.5–5.0)
ALK PHOS: 69 U/L (ref 40–150)
ALT: 17 U/L (ref 0–55)
ANION GAP: 12 meq/L — AB (ref 3–11)
AST: 22 U/L (ref 5–34)
BILIRUBIN TOTAL: 0.22 mg/dL (ref 0.20–1.20)
BUN: 17 mg/dL (ref 7.0–26.0)
CALCIUM: 9.7 mg/dL (ref 8.4–10.4)
CHLORIDE: 100 meq/L (ref 98–109)
CO2: 22 mEq/L (ref 22–29)
CREATININE: 1.2 mg/dL — AB (ref 0.6–1.1)
EGFR: 45 mL/min/{1.73_m2} — ABNORMAL LOW (ref >90)
Glucose: 195 mg/dl — ABNORMAL HIGH (ref 70–140)
Potassium: 4.4 mEq/L (ref 3.5–5.1)
Sodium: 134 mEq/L — ABNORMAL LOW (ref 136–145)
Total Protein: 6.9 g/dL (ref 6.4–8.3)

## 2016-10-11 LAB — IRON AND TIBC
%SAT: 10 % — AB (ref 21–57)
IRON: 38 ug/dL — AB (ref 41–142)
TIBC: 385 ug/dL (ref 236–444)
UIBC: 347 ug/dL (ref 120–384)

## 2016-10-11 LAB — CBC WITH DIFFERENTIAL (CANCER CENTER ONLY)
BASO#: 0.1 10*3/uL (ref 0.0–0.2)
BASO%: 1.6 % (ref 0.0–2.0)
EOS%: 5.2 % (ref 0.0–7.0)
Eosinophils Absolute: 0.4 10*3/uL (ref 0.0–0.5)
HEMATOCRIT: 28.3 % — AB (ref 34.8–46.6)
HEMOGLOBIN: 9.6 g/dL — AB (ref 11.6–15.9)
LYMPH#: 1.9 10*3/uL (ref 0.9–3.3)
LYMPH%: 24.5 % (ref 14.0–48.0)
MCH: 30.4 pg (ref 26.0–34.0)
MCHC: 33.9 g/dL (ref 32.0–36.0)
MCV: 90 fL (ref 81–101)
MONO#: 0.6 10*3/uL (ref 0.1–0.9)
MONO%: 7.7 % (ref 0.0–13.0)
NEUT%: 61 % (ref 39.6–80.0)
NEUTROS ABS: 4.7 10*3/uL (ref 1.5–6.5)
Platelets: 550 10*3/uL — ABNORMAL HIGH (ref 145–400)
RBC: 3.16 10*6/uL — ABNORMAL LOW (ref 3.70–5.32)
RDW: 15.2 % (ref 11.1–15.7)
WBC: 7.7 10*3/uL (ref 3.9–10.0)

## 2016-10-11 LAB — CHCC SATELLITE - SMEAR

## 2016-10-11 LAB — LACTATE DEHYDROGENASE: LDH: 139 U/L (ref 125–245)

## 2016-10-11 LAB — FERRITIN: FERRITIN: 7 ng/mL — AB (ref 9–269)

## 2016-10-12 LAB — RETICULOCYTES: RETICULOCYTE COUNT: 0.9 % (ref 0.6–2.6)

## 2016-10-12 LAB — ERYTHROPOIETIN: Erythropoietin: 31.3 m[IU]/mL — ABNORMAL HIGH (ref 2.6–18.5)
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
## 2016-10-14 LAB — HEMOGLOBINOPATHY EVALUATION
HEMOGLOBIN A2 QUANTITATION: 2 % (ref 1.8–3.2)
HEMOGLOBIN F QUANTITATION: 0 % (ref 0.0–2.0)
HGB A: 98 % (ref 96.4–98.8)
HGB C: 0 %
HGB S: 0 %
HGB VARIANT: 0 %

## 2016-10-15 ENCOUNTER — Other Ambulatory Visit: Payer: Self-pay | Admitting: Family

## 2016-10-16 ENCOUNTER — Other Ambulatory Visit: Payer: Self-pay | Admitting: Family

## 2016-10-16 ENCOUNTER — Ambulatory Visit (HOSPITAL_BASED_OUTPATIENT_CLINIC_OR_DEPARTMENT_OTHER): Payer: PPO

## 2016-10-16 VITALS — BP 145/77 | HR 93 | Temp 97.5°F | Resp 18

## 2016-10-16 DIAGNOSIS — D509 Iron deficiency anemia, unspecified: Secondary | ICD-10-CM | POA: Diagnosis not present

## 2016-10-16 DIAGNOSIS — D500 Iron deficiency anemia secondary to blood loss (chronic): Secondary | ICD-10-CM

## 2016-10-16 MED ORDER — FERUMOXYTOL INJECTION 510 MG/17 ML
510.0000 mg | Freq: Once | INTRAVENOUS | Status: AC
  Administered 2016-10-16: 510 mg via INTRAVENOUS
  Filled 2016-10-16: qty 17

## 2016-10-16 MED ORDER — SODIUM CHLORIDE 0.9 % IV SOLN
Freq: Once | INTRAVENOUS | Status: AC
  Administered 2016-10-16: 15:00:00 via INTRAVENOUS

## 2016-10-16 NOTE — Patient Instructions (Signed)
Anemia, Nonspecific Anemia is a condition in which the concentration of red blood cells or hemoglobin in the blood is below normal. Hemoglobin is a substance in red blood cells that carries oxygen to the tissues of the body. Anemia results in not enough oxygen reaching these tissues. What are the causes? Common causes of anemia include:  Excessive bleeding. Bleeding may be internal or external. This includes excessive bleeding from periods (in women) or from the intestine.  Poor nutrition.  Chronic kidney, thyroid, and liver disease.  Bone marrow disorders that decrease red blood cell production.  Cancer and treatments for cancer.  HIV, AIDS, and their treatments.  Spleen problems that increase red blood cell destruction.  Blood disorders.  Excess destruction of red blood cells due to infection, medicines, and autoimmune disorders. What are the signs or symptoms?  Minor weakness.  Dizziness.  Headache.  Palpitations.  Shortness of breath, especially with exercise.  Paleness.  Cold sensitivity.  Indigestion.  Nausea.  Difficulty sleeping.  Difficulty concentrating. Symptoms may occur suddenly or they may develop slowly. How is this diagnosed? Additional blood tests are often needed. These help your health care provider determine the best treatment. Your health care provider will check your stool for blood and look for other causes of blood loss. How is this treated? Treatment varies depending on the cause of the anemia. Treatment can include:  Supplements of iron, vitamin B12, or folic acid.  Hormone medicines.  A blood transfusion. This may be needed if blood loss is severe.  Hospitalization. This may be needed if there is significant continual blood loss.  Dietary changes.  Spleen removal. Follow these instructions at home: Keep all follow-up appointments. It often takes many weeks to correct anemia, and having your health care provider check on your  condition and your response to treatment is very important. Get help right away if:  You develop extreme weakness, shortness of breath, or chest pain.  You become dizzy or have trouble concentrating.  You develop heavy vaginal bleeding.  You develop a rash.  You have bloody or black, tarry stools.  You faint.  You vomit up blood.  You vomit repeatedly.  You have abdominal pain.  You have a fever or persistent symptoms for more than 2-3 days.  You have a fever and your symptoms suddenly get worse.  You are dehydrated. This information is not intended to replace advice given to you by your health care provider. Make sure you discuss any questions you have with your health care provider. Document Released: 09/19/2004 Document Revised: 01/24/2016 Document Reviewed: 02/05/2013 Elsevier Interactive Patient Education  2017 [employer]  

## 2016-10-17 ENCOUNTER — Ambulatory Visit: Payer: PPO

## 2016-10-17 DIAGNOSIS — M549 Dorsalgia, unspecified: Secondary | ICD-10-CM | POA: Diagnosis not present

## 2016-10-17 DIAGNOSIS — M419 Scoliosis, unspecified: Secondary | ICD-10-CM | POA: Diagnosis not present

## 2016-10-17 DIAGNOSIS — M4726 Other spondylosis with radiculopathy, lumbar region: Secondary | ICD-10-CM | POA: Diagnosis not present

## 2016-10-18 DIAGNOSIS — E1165 Type 2 diabetes mellitus with hyperglycemia: Secondary | ICD-10-CM | POA: Diagnosis not present

## 2016-10-18 DIAGNOSIS — J3089 Other allergic rhinitis: Secondary | ICD-10-CM | POA: Diagnosis not present

## 2016-10-18 DIAGNOSIS — J301 Allergic rhinitis due to pollen: Secondary | ICD-10-CM | POA: Diagnosis not present

## 2016-10-18 DIAGNOSIS — E7800 Pure hypercholesterolemia, unspecified: Secondary | ICD-10-CM | POA: Diagnosis not present

## 2016-10-18 DIAGNOSIS — Z794 Long term (current) use of insulin: Secondary | ICD-10-CM | POA: Diagnosis not present

## 2016-10-19 ENCOUNTER — Other Ambulatory Visit: Payer: Self-pay | Admitting: Internal Medicine

## 2016-10-22 DIAGNOSIS — E1129 Type 2 diabetes mellitus with other diabetic kidney complication: Secondary | ICD-10-CM | POA: Insufficient documentation

## 2016-10-22 DIAGNOSIS — E1165 Type 2 diabetes mellitus with hyperglycemia: Secondary | ICD-10-CM | POA: Diagnosis not present

## 2016-10-22 DIAGNOSIS — E1169 Type 2 diabetes mellitus with other specified complication: Secondary | ICD-10-CM | POA: Insufficient documentation

## 2016-10-22 DIAGNOSIS — IMO0002 Reserved for concepts with insufficient information to code with codable children: Secondary | ICD-10-CM | POA: Insufficient documentation

## 2016-10-22 DIAGNOSIS — Z794 Long term (current) use of insulin: Secondary | ICD-10-CM | POA: Diagnosis not present

## 2016-10-22 DIAGNOSIS — E7800 Pure hypercholesterolemia, unspecified: Secondary | ICD-10-CM | POA: Diagnosis not present

## 2016-10-22 DIAGNOSIS — E785 Hyperlipidemia, unspecified: Secondary | ICD-10-CM | POA: Insufficient documentation

## 2016-10-24 ENCOUNTER — Ambulatory Visit (HOSPITAL_BASED_OUTPATIENT_CLINIC_OR_DEPARTMENT_OTHER): Payer: PPO

## 2016-10-24 VITALS — BP 151/69 | HR 92 | Temp 97.9°F | Resp 16

## 2016-10-24 DIAGNOSIS — D509 Iron deficiency anemia, unspecified: Secondary | ICD-10-CM | POA: Diagnosis not present

## 2016-10-24 DIAGNOSIS — D500 Iron deficiency anemia secondary to blood loss (chronic): Secondary | ICD-10-CM

## 2016-10-24 MED ORDER — SODIUM CHLORIDE 0.9 % IV SOLN
Freq: Once | INTRAVENOUS | Status: AC
  Administered 2016-10-24: 12:00:00 via INTRAVENOUS

## 2016-10-24 MED ORDER — SODIUM CHLORIDE 0.9 % IV SOLN
510.0000 mg | Freq: Once | INTRAVENOUS | Status: AC
  Administered 2016-10-24: 510 mg via INTRAVENOUS
  Filled 2016-10-24: qty 17

## 2016-10-24 NOTE — Patient Instructions (Signed)

## 2016-11-03 DIAGNOSIS — G4733 Obstructive sleep apnea (adult) (pediatric): Secondary | ICD-10-CM | POA: Diagnosis not present

## 2016-11-08 DIAGNOSIS — J301 Allergic rhinitis due to pollen: Secondary | ICD-10-CM | POA: Diagnosis not present

## 2016-11-08 DIAGNOSIS — J3089 Other allergic rhinitis: Secondary | ICD-10-CM | POA: Diagnosis not present

## 2016-11-12 DIAGNOSIS — M4726 Other spondylosis with radiculopathy, lumbar region: Secondary | ICD-10-CM | POA: Diagnosis not present

## 2016-11-12 DIAGNOSIS — M5117 Intervertebral disc disorders with radiculopathy, lumbosacral region: Secondary | ICD-10-CM | POA: Diagnosis not present

## 2016-11-12 DIAGNOSIS — M545 Low back pain: Secondary | ICD-10-CM | POA: Diagnosis not present

## 2016-11-12 DIAGNOSIS — M419 Scoliosis, unspecified: Secondary | ICD-10-CM | POA: Diagnosis not present

## 2016-11-12 DIAGNOSIS — M5116 Intervertebral disc disorders with radiculopathy, lumbar region: Secondary | ICD-10-CM | POA: Diagnosis not present

## 2016-11-25 ENCOUNTER — Ambulatory Visit (HOSPITAL_BASED_OUTPATIENT_CLINIC_OR_DEPARTMENT_OTHER): Payer: PPO | Admitting: Family

## 2016-11-25 ENCOUNTER — Other Ambulatory Visit (HOSPITAL_BASED_OUTPATIENT_CLINIC_OR_DEPARTMENT_OTHER): Payer: PPO

## 2016-11-25 VITALS — BP 130/63 | HR 85 | Temp 98.2°F | Resp 17 | Wt 151.0 lb

## 2016-11-25 DIAGNOSIS — D631 Anemia in chronic kidney disease: Secondary | ICD-10-CM

## 2016-11-25 DIAGNOSIS — D509 Iron deficiency anemia, unspecified: Secondary | ICD-10-CM

## 2016-11-25 DIAGNOSIS — N183 Chronic kidney disease, stage 3 (moderate): Secondary | ICD-10-CM

## 2016-11-25 DIAGNOSIS — D62 Acute posthemorrhagic anemia: Secondary | ICD-10-CM

## 2016-11-25 DIAGNOSIS — N185 Chronic kidney disease, stage 5: Principal | ICD-10-CM

## 2016-11-25 DIAGNOSIS — D500 Iron deficiency anemia secondary to blood loss (chronic): Secondary | ICD-10-CM

## 2016-11-25 LAB — CBC WITH DIFFERENTIAL (CANCER CENTER ONLY)
BASO#: 0.1 10*3/uL (ref 0.0–0.2)
BASO%: 1 % (ref 0.0–2.0)
EOS ABS: 0.2 10*3/uL (ref 0.0–0.5)
EOS%: 2.9 % (ref 0.0–7.0)
HEMATOCRIT: 33.4 % — AB (ref 34.8–46.6)
HEMOGLOBIN: 11.4 g/dL — AB (ref 11.6–15.9)
LYMPH#: 2.3 10*3/uL (ref 0.9–3.3)
LYMPH%: 29.1 % (ref 14.0–48.0)
MCH: 32.1 pg (ref 26.0–34.0)
MCHC: 34.1 g/dL (ref 32.0–36.0)
MCV: 94 fL (ref 81–101)
MONO#: 0.7 10*3/uL (ref 0.1–0.9)
MONO%: 9 % (ref 0.0–13.0)
NEUT%: 58 % (ref 39.6–80.0)
NEUTROS ABS: 4.6 10*3/uL (ref 1.5–6.5)
Platelets: 419 10*3/uL — ABNORMAL HIGH (ref 145–400)
RBC: 3.55 10*6/uL — AB (ref 3.70–5.32)
RDW: 18.3 % — ABNORMAL HIGH (ref 11.1–15.7)
WBC: 7.9 10*3/uL (ref 3.9–10.0)

## 2016-11-25 NOTE — Progress Notes (Signed)
Hematology and Oncology Follow Up Visit  ROSELIA SNIPE [id number] 1941-06-17 76 y.o. 11/25/2016   Principle Diagnosis:  1. Iron deficiency anemia  2. Anemia of chronic kidney disease stage 3  Current Therapy:   IV iron as indicated - last received March 2018    Interim History:  Ms. Depace is here today with her husband for follow-up. She is still having some fatigue and headaches. She has an appointment with her Neurologist Dr. Jaynee Eagles for Botox injections for her migraines on May 1st.  She received IV iron in February and March and did well. Hgb is now 11.4 with an MCV of 94. Iron studies are pending.  I was sorry to hear that their son was recently diagnosed with colon cancer. She states that he will be worked up at [facility] in [place]. They are waiting to find out the extent of disease and treatment options.  No fever, chills, n/v, cough, rash, dizziness, SOB, chest pain, palpitations, abdominal pain or changes in bowel or bladder habits.  She has chronic constipation and some cramping. She takes Linzess and Bentyl as needed.  No swelling or tingling in her extremities. No new aches or pains noted.  She has maintained a good appetite and is staying well hydrated. Her weight is stable.   Medications:  Allergies as of 11/25/2016      Reactions   Amlodipine Other (See Comments)   Dizzy, headaches   Celebrex [celecoxib] Other (See Comments)   headaches   Topamax [topiramate] Other (See Comments)   hallucinations   Codeine Nausea Only   Gabapentin Other (See Comments)   dizzy   Hydrocodone Nausea Only, Other (See Comments)   Feeling funny,    Meloxicam Other (See Comments)   jittery   Pravastatin Sodium Other (See Comments)   Muscle aches and pains      Medication List       Accurate as of 11/25/16  2:39 PM. Always use your most recent med list.          aspirin EC 81 MG tablet Take 1 tablet (81 mg total) by mouth daily.   b complex vitamins tablet Take 1 tablet by  mouth daily. Vitamin supplement   BD PEN NEEDLE NANO U/F 32G X 4 MM Misc Generic drug:  Insulin Pen Needle USE AS DIRECTED TWICE DAILY.   busPIRone 15 MG tablet Commonly known as:  BUSPAR Take 1 tablet by mouth 2 (two) times daily.   calcium-vitamin D 500-200 MG-UNIT tablet Commonly known as:  OSCAL WITH D Take 1 tablet by mouth daily with breakfast.   cetirizine 10 MG tablet Commonly known as:  ZYRTEC Take 10 mg by mouth daily as needed for allergies.   conjugated estrogens vaginal cream Commonly known as:  PREMARIN Place 1 Applicatorful vaginally 2 (two) times a week.   DEPLIN 15 MG Tabs Take 15 mg by mouth daily. For treatment of depression   dicyclomine 10 MG capsule Commonly known as:  BENTYL TAKE 1 CAPSULE (10 MG TOTAL) BY MOUTH 3 (THREE) TIMES DAILY BEFORE MEALS.   estradiol 0.025 mg/24hr patch Commonly known as:  CLIMARA - Dosed in mg/24 hr Place 1 patch (0.025 mg total) onto the skin once a week. APPLY 1 PATCH ONTO SKIN ONCE WEEKLY   furosemide 20 MG tablet Commonly known as:  LASIX TAKE 1 TO 2 TABLETS BY MOUTH EVERY DAY   LANTUS 100 UNIT/ML injection Generic drug:  insulin glargine Inject into the skin every evening. 8 OR  10 UNITS   linaclotide 145 MCG Caps capsule Commonly known as:  LINZESS Take 1 capsule (145 mcg total) by mouth daily.   LORazepam 1 MG tablet Commonly known as:  ATIVAN Take 1 mg by mouth 4 (four) times daily as needed for anxiety.   lovastatin 20 MG tablet Commonly known as:  MEVACOR TAKE 1 TABLET BY MOUTH EVERY DAY **NEED OV**   Magnesium 500 MG Caps Take 1 capsule by mouth daily.   metFORMIN 500 MG 24 hr tablet Commonly known as:  GLUCOPHAGE-XR Take 1,000 mg by mouth 2 (two) times daily. For DM type 2   multivitamin,tx-minerals tablet Take 1 tablet by mouth daily.   ONETOUCH DELICA LANCETS 64Q Misc USE UTD UP TO QID   ONETOUCH VERIO test strip Generic drug:  glucose blood   pantoprazole 40 MG tablet Commonly known  as:  PROTONIX TAKE 1 TABLET BY MOUTH TWICE A DAY   pioglitazone 45 MG tablet Commonly known as:  ACTOS Take 45 mg by mouth daily.   SALONPAS PAIN RELIEF PATCH EX Apply 1 patch topically at bedtime.   Testosterone Powd Testosterone 4% cream 30 Gram Tube Apply a tiny pea sized amount once daily as directed.   traMADol-acetaminophen 37.5-325 MG tablet Commonly known as:  ULTRACET TAKE 1 TABLET BY MOUTH EVERY 8 HOURS AS NEEDED   TRINTELLIX 20 MG Tabs Generic drug:  vortioxetine HBr Take 20 mg by mouth daily.   VICTOZA 18 MG/3ML Sopn Generic drug:  liraglutide ADM 1.8 MG [place] QD   VOLTAREN 1 % Gel Generic drug:  diclofenac sodium APPLY 2 GRAMS TO KNEE 2-3 TIMES DAILY PRN       Allergies:  Allergies  Allergen Reactions  . Amlodipine Other (See Comments)    Dizzy, headaches  . Celebrex [Celecoxib] Other (See Comments)    headaches  . Topamax [Topiramate] Other (See Comments)    hallucinations  . Codeine Nausea Only  . Gabapentin Other (See Comments)    dizzy  . Hydrocodone Nausea Only and Other (See Comments)    Feeling funny,   . Meloxicam Other (See Comments)    jittery  . Pravastatin Sodium Other (See Comments)    Muscle aches and pains    Past Medical History, Surgical history, Social history, and Family History were reviewed and updated.  Review of Systems: All other 10 point review of systems is negative.   Physical Exam:  weight is 151 lb (68.5 kg). Her oral temperature is 98.2 F (36.8 C). Her blood pressure is 130/63 and her pulse is 85. Her respiration is 17 and oxygen saturation is 98%.   Wt Readings from Last 3 Encounters:  11/25/16 151 lb (68.5 kg)  10/11/16 153 lb 12.8 oz (69.8 kg)  09/24/16 152 lb 9.6 oz (69.2 kg)    Ocular: Sclerae unicteric, pupils equal, round and reactive to light Ear-nose-throat: Oropharynx clear, dentition fair Lymphatic: No cervical, supraclavicular or axillary adenopathy Lungs no rales or rhonchi, good excursion  bilaterally Heart regular rate and rhythm, no murmur appreciated Abd soft, nontender, positive bowel sounds, no liver or spleen tip palpated on exam, no fluid wave MSK no focal spinal tenderness, no joint edema Neuro: non-focal, well-oriented, appropriate affect Breasts: Deferred   Lab Results  Component Value Date   WBC 7.9 11/25/2016   HGB 11.4 (L) 11/25/2016   HCT 33.4 (L) 11/25/2016   MCV 94 11/25/2016   PLT 419 (H) 11/25/2016   Lab Results  Component Value Date   FERRITIN 7 (  L) 10/11/2016   IRON 38 (L) 10/11/2016   TIBC 385 10/11/2016   UIBC 347 10/11/2016   IRONPCTSAT 10 (L) 10/11/2016   Lab Results  Component Value Date   RETICCTPCT 1.7 02/07/2006   RBC 3.55 (L) 11/25/2016   RETICCTABS 68.2 02/07/2006   No results found for: KPAFRELGTCHN, LAMBDASER, KAPLAMBRATIO No results found for: IGGSERUM, IGA, IGMSERUM No results found for: Ronnald Ramp, A1GS, A2GS, Violet Baldy, MSPIKE, SPEI   Chemistry      Component Value Date/Time   NA 134 (L) 10/11/2016 1039   K 4.4 10/11/2016 1039   CL 100 07/04/2016 1235   CO2 22 10/11/2016 1039   BUN 17.0 10/11/2016 1039   CREATININE 1.2 (H) 10/11/2016 1039      Component Value Date/Time   CALCIUM 9.7 10/11/2016 1039   ALKPHOS 69 10/11/2016 1039   AST 22 10/11/2016 1039   ALT 17 10/11/2016 1039   BILITOT 0.22 10/11/2016 1039     Impression and Plan: Ms. Slabach is a very pleasant 77 yo white female with history of iron deficiency anemia and anemia of chronic kidney disease stage 3. She received 2 doses of IV iron 6 weeks ago and her Hgb is up to 11.4 with an MCV 94. She is still symptomatic with fatigue.  We will see what her iron studies show and bring her back in later this week for an infusion if needed.  We will plan to see her back in 2 months for repeat lab work and follow-up.  She will contact our office with any questions or concerns. We can certainly see her sooner if need be.   Eliezer Bottom,  NP 4/2/20182:39 PM

## 2016-11-26 LAB — IRON AND TIBC
%SAT: 32 % (ref 21–57)
IRON: 90 ug/dL (ref 41–142)
TIBC: 284 ug/dL (ref 236–444)
UIBC: 194 ug/dL (ref 120–384)

## 2016-11-26 LAB — FERRITIN: FERRITIN: 184 ng/mL (ref 9–269)

## 2016-12-02 DIAGNOSIS — M4726 Other spondylosis with radiculopathy, lumbar region: Secondary | ICD-10-CM | POA: Diagnosis not present

## 2016-12-02 DIAGNOSIS — M419 Scoliosis, unspecified: Secondary | ICD-10-CM | POA: Diagnosis not present

## 2016-12-04 DIAGNOSIS — G4733 Obstructive sleep apnea (adult) (pediatric): Secondary | ICD-10-CM | POA: Diagnosis not present

## 2016-12-11 ENCOUNTER — Ambulatory Visit (INDEPENDENT_AMBULATORY_CARE_PROVIDER_SITE_OTHER): Payer: PPO | Admitting: Neurology

## 2016-12-11 ENCOUNTER — Encounter: Payer: Self-pay | Admitting: Neurology

## 2016-12-11 VITALS — BP 128/82 | HR 76 | Ht 62.0 in | Wt 151.0 lb

## 2016-12-11 DIAGNOSIS — R51 Headache: Secondary | ICD-10-CM

## 2016-12-11 DIAGNOSIS — G4733 Obstructive sleep apnea (adult) (pediatric): Secondary | ICD-10-CM

## 2016-12-11 DIAGNOSIS — R519 Headache, unspecified: Secondary | ICD-10-CM

## 2016-12-11 DIAGNOSIS — Z9989 Dependence on other enabling machines and devices: Secondary | ICD-10-CM

## 2016-12-11 NOTE — Patient Instructions (Signed)
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
## 2016-12-18 DIAGNOSIS — J301 Allergic rhinitis due to pollen: Secondary | ICD-10-CM | POA: Diagnosis not present

## 2016-12-18 DIAGNOSIS — J3089 Other allergic rhinitis: Secondary | ICD-10-CM | POA: Diagnosis not present

## 2016-12-19 ENCOUNTER — Encounter: Payer: Self-pay | Admitting: Internal Medicine

## 2016-12-19 MED ORDER — LINACLOTIDE 145 MCG PO CAPS: 145.0000 ug | ORAL_CAPSULE | Freq: Every day | ORAL | 0 refills | Status: DC

## 2016-12-24 ENCOUNTER — Ambulatory Visit (INDEPENDENT_AMBULATORY_CARE_PROVIDER_SITE_OTHER): Payer: Self-pay | Admitting: Neurology

## 2016-12-24 ENCOUNTER — Telehealth: Payer: Self-pay | Admitting: Neurology

## 2016-12-24 ENCOUNTER — Telehealth: Payer: Self-pay

## 2016-12-24 DIAGNOSIS — G43009 Migraine without aura, not intractable, without status migrainosus: Secondary | ICD-10-CM | POA: Diagnosis not present

## 2016-12-24 DIAGNOSIS — G43011 Migraine without aura, intractable, with status migrainosus: Secondary | ICD-10-CM

## 2016-12-24 NOTE — Telephone Encounter (Signed)
Pt called office, said HA is so bad today she really doesn't want to come for botox appt today. She is wanting to speak with Rn to advised. MaryClare/RN skyped, she advised to send tele note to her so she could speak with Dr A and call her back

## 2016-12-24 NOTE — Telephone Encounter (Signed)
Called pt who says that she has a terrible HA and is very sick on her stomach. Discussed w/ Dr. Jaynee Eagles, spoke to [facility] RN and then to pt's husband. Agreed to have pt here before lunch for migraine cocktail.

## 2016-12-24 NOTE — Progress Notes (Signed)
Patient came in for migraine cocktail, not injections. Has rescheduled her botox.

## 2016-12-24 NOTE — Telephone Encounter (Signed)
Called pt to see if she's feeling better after migraine cocktail. Husband reports that she came home, ate something and has been asleep since then. Agreed to call back tomorrow if migraine gets worse or is not improved. Let him know that Botox was r/s to 01/02/17 @ 4:30. Verbalized understanding and appreciation for call.

## 2016-12-24 NOTE — Telephone Encounter (Signed)
Per Dr. Jaynee Eagles, pt needs another botox apt. Pt wants you to call her husband Patrick Jupiter to schedule. [phone]

## 2016-12-26 DIAGNOSIS — Z6826 Body mass index (BMI) 26.0-26.9, adult: Secondary | ICD-10-CM | POA: Diagnosis not present

## 2016-12-26 DIAGNOSIS — M419 Scoliosis, unspecified: Secondary | ICD-10-CM | POA: Diagnosis not present

## 2016-12-26 DIAGNOSIS — J301 Allergic rhinitis due to pollen: Secondary | ICD-10-CM | POA: Diagnosis not present

## 2016-12-26 DIAGNOSIS — M47816 Spondylosis without myelopathy or radiculopathy, lumbar region: Secondary | ICD-10-CM | POA: Diagnosis not present

## 2016-12-26 DIAGNOSIS — J3089 Other allergic rhinitis: Secondary | ICD-10-CM | POA: Diagnosis not present

## 2016-12-26 DIAGNOSIS — M48061 Spinal stenosis, lumbar region without neurogenic claudication: Secondary | ICD-10-CM | POA: Diagnosis not present

## 2017-01-01 ENCOUNTER — Ambulatory Visit (INDEPENDENT_AMBULATORY_CARE_PROVIDER_SITE_OTHER): Payer: PPO | Admitting: Internal Medicine

## 2017-01-01 ENCOUNTER — Encounter: Payer: Self-pay | Admitting: Internal Medicine

## 2017-01-01 VITALS — BP 130/76 | HR 82 | Temp 97.7°F | Ht 62.0 in | Wt 151.1 lb

## 2017-01-01 DIAGNOSIS — Z23 Encounter for immunization: Secondary | ICD-10-CM

## 2017-01-01 DIAGNOSIS — E118 Type 2 diabetes mellitus with unspecified complications: Secondary | ICD-10-CM | POA: Diagnosis not present

## 2017-01-01 DIAGNOSIS — J301 Allergic rhinitis due to pollen: Secondary | ICD-10-CM | POA: Diagnosis not present

## 2017-01-01 DIAGNOSIS — J3089 Other allergic rhinitis: Secondary | ICD-10-CM | POA: Diagnosis not present

## 2017-01-01 DIAGNOSIS — K5904 Chronic idiopathic constipation: Secondary | ICD-10-CM | POA: Diagnosis not present

## 2017-01-01 DIAGNOSIS — R5382 Chronic fatigue, unspecified: Secondary | ICD-10-CM

## 2017-01-01 DIAGNOSIS — K219 Gastro-esophageal reflux disease without esophagitis: Secondary | ICD-10-CM

## 2017-01-01 MED ORDER — ZOSTER VAC RECOMB ADJUVANTED 50 MCG/0.5ML IM SUSR: 0.5000 mL | Freq: Once | INTRAMUSCULAR | 1 refills | Status: AC

## 2017-01-01 NOTE — Addendum Note (Signed)
Addended by: Karren Cobble on: 01/01/2017 02:55 PM   Modules accepted: Orders

## 2017-01-01 NOTE — Assessment & Plan Note (Signed)
Victoza, Metformin, Lantus  

## 2017-01-01 NOTE — Progress Notes (Signed)
Subjective:  Patient ID: [name], female    DOB: 1940/11/01  Age: 76 y.o. MRN: [id number]  CC: No chief complaint on file.   HPI [name] presents for GERD, DM, HTN, LBP f/u  C/o a lot of GERD sx's - worse after dinner on Protonix bid; Gaviscon tablets help. C/o cramps The pt needs to get an in-network Endo. She just had labs...  Outpatient Medications Prior to Visit  Medication Sig Dispense Refill  . aspirin EC 81 MG tablet Take 1 tablet (81 mg total) by mouth daily. 100 tablet 3  . b complex vitamins tablet Take 1 tablet by mouth daily. Vitamin supplement    . BD PEN NEEDLE NANO U/F 32G X 4 MM MISC USE AS DIRECTED TWICE DAILY.  3  . busPIRone (BUSPAR) 15 MG tablet Take 1 tablet by mouth 2 (two) times daily.    . calcium-vitamin D (OSCAL WITH D) 500-200 MG-UNIT per tablet Take 1 tablet by mouth daily with breakfast.    . cetirizine (ZYRTEC) 10 MG tablet Take 10 mg by mouth daily as needed for allergies.     Marland Kitchen conjugated estrogens (PREMARIN) vaginal cream Place 1 Applicatorful vaginally 2 (two) times a week. 42.5 g 4  . dicyclomine (BENTYL) 10 MG capsule TAKE 1 CAPSULE (10 MG TOTAL) BY MOUTH 3 (THREE) TIMES DAILY BEFORE MEALS. 270 capsule 1  . estradiol (CLIMARA - DOSED IN MG/24 HR) 0.025 mg/24hr patch Place 1 patch (0.025 mg total) onto the skin once a week. APPLY 1 PATCH ONTO SKIN ONCE WEEKLY 12 patch 4  . furosemide (LASIX) 20 MG tablet TAKE 1 TO 2 TABLETS BY MOUTH EVERY DAY (Patient taking differently: TAKE 1 TABLET BY MOUTH EVERY DAY) 60 tablet 5  . insulin glargine (LANTUS) 100 UNIT/ML injection Inject into the skin every evening. 8 OR 10 UNITS    . L-Methylfolate (DEPLIN) 15 MG TABS Take 15 mg by mouth daily. For treatment of depression    . linaclotide (LINZESS) 145 MCG CAPS capsule Take 1 capsule (145 mcg total) by mouth daily. Keep May 9th appt for future refills 30 capsule 0  . Liniments (SALONPAS PAIN RELIEF PATCH EX) Apply 1 patch topically at bedtime.    Marland Kitchen  LORazepam (ATIVAN) 1 MG tablet Take 1 mg by mouth 4 (four) times daily as needed for anxiety.   2  . lovastatin (MEVACOR) 20 MG tablet TAKE 1 TABLET BY MOUTH EVERY DAY **NEED OV** (Patient taking differently: 1 TAB EVERY OTHER DAY) 90 tablet 3  . Magnesium 500 MG CAPS Take 1 capsule by mouth daily.    . metFORMIN (GLUCOPHAGE-XR) 500 MG 24 hr tablet Take 1,000 mg by mouth 2 (two) times daily. For DM type 2    . Multiple Vitamins-Minerals (MULTIVITAMIN,TX-MINERALS) tablet Take 1 tablet by mouth daily.     [name] DELICA LANCETS 76P MISC USE UTD UP TO QID  1  . ONETOUCH VERIO test strip     . pantoprazole (PROTONIX) 40 MG tablet TAKE 1 TABLET BY MOUTH TWICE A DAY 180 tablet 1  . pioglitazone (ACTOS) 45 MG tablet Take 45 mg by mouth daily.  3  . Testosterone POWD Testosterone 4% cream 30 Gram Tube Apply a tiny pea sized amount once daily as directed. 30 g 0  . traMADol-acetaminophen (ULTRACET) 37.5-325 MG tablet TAKE 1 TABLET BY MOUTH EVERY 8 HOURS AS NEEDED 90 tablet 3  . TRINTELLIX 20 MG TABS Take 20 mg by mouth daily.  2  .  VICTOZA 18 MG/3ML SOPN ADM 1.2 MG  QD  0  . VOLTAREN 1 % GEL APPLY 2 GRAMS TO KNEE 2-3 TIMES DAILY PRN  1   No facility-administered medications prior to visit.     ROS Review of Systems  Constitutional: Positive for fatigue. Negative for activity change, appetite change, chills and unexpected weight change.  HENT: Negative for congestion, mouth sores and sinus pressure.   Eyes: Negative for visual disturbance.  Respiratory: Negative for cough and chest tightness.   Gastrointestinal: Positive for constipation. Negative for abdominal pain and nausea.  Genitourinary: Negative for difficulty urinating, frequency and vaginal pain.  Musculoskeletal: Positive for arthralgias and back pain. Negative for gait problem.  Skin: Negative for pallor and rash.  Neurological: Negative for dizziness, tremors, weakness, numbness and headaches.  Psychiatric/Behavioral: Positive  for decreased concentration, dysphoric mood and sleep disturbance. Negative for confusion and suicidal ideas. The patient is nervous/anxious.     Objective:  BP 130/76 (BP Location: Left Arm, Patient Position: Sitting, Cuff Size: Normal)   Pulse 82   Temp 97.7 F (36.5 C) (Oral)   Ht 5\' 2"  (1.575 m)   Wt 151 lb 1.9 oz (68.5 kg)   SpO2 98%   BMI 27.64 kg/m   BP Readings from Last 3 Encounters:  01/01/17 130/76  12/11/16 128/82  11/25/16 130/63    Wt Readings from Last 3 Encounters:  01/01/17 151 lb 1.9 oz (68.5 kg)  12/11/16 151 lb (68.5 kg)  11/25/16 151 lb (68.5 kg)    Physical Exam  Constitutional: She appears well-developed. No distress.  HENT:  Head: Normocephalic.  Right Ear: External ear normal.  Left Ear: External ear normal.  Nose: Nose normal.  Mouth/Throat: Oropharynx is clear and moist.  Eyes: Conjunctivae are normal. Pupils are equal, round, and reactive to light. Right eye exhibits no discharge. Left eye exhibits no discharge.  Neck: Normal range of motion. Neck supple. No JVD present. No tracheal deviation present. No thyromegaly present.  Cardiovascular: Normal rate, regular rhythm and normal heart sounds.   Pulmonary/Chest: No stridor. No respiratory distress. She has no wheezes.  Abdominal: Soft. Bowel sounds are normal. She exhibits no distension and no mass. There is no tenderness. There is no rebound and no guarding.  Musculoskeletal: She exhibits tenderness. She exhibits no edema.  Lymphadenopathy:    She has no cervical adenopathy.  Neurological: She displays normal reflexes. No cranial nerve deficit. She exhibits normal muscle tone. Coordination normal.  Skin: No rash noted. No erythema.  Psychiatric: She has a normal mood and affect. Her behavior is normal. Judgment and thought content normal.  obese  Lab Results  Component Value Date   WBC 7.9 11/25/2016   HGB 11.4 (L) 11/25/2016   HCT 33.4 (L) 11/25/2016   PLT 419 (H) 11/25/2016   GLUCOSE  195 (H) 10/11/2016   CHOL 119 05/10/2014   TRIG 77.0 05/10/2014   HDL 54.30 05/10/2014   LDLCALC 49 05/10/2014   ALT 17 10/11/2016   AST 22 10/11/2016   NA 134 (L) 10/11/2016   K 4.4 10/11/2016   CL 100 07/04/2016   CREATININE 1.2 (H) 10/11/2016   BUN 17.0 10/11/2016   CO2 22 10/11/2016   TSH 1.26 08/07/2010   INR 0.93 11/02/2013   HGBA1C 6.8 (H) 03/29/2015   MICROALBUR 0.2 11/07/2006    US Abdomen Complete  Result Date: 07/11/2016 CLINICAL DATA:  Nausea and vomiting. Constipation. GERD. Cholecystectomy. EXAM: ABDOMEN ULTRASOUND COMPLETE COMPARISON:  Ultrasound abdomen 09/29/2007 FINDINGS: Gallbladder:  Cholecystectomy Common bile duct: Diameter: 5.4 mm Liver: No focal lesion identified. Within normal limits in parenchymal echogenicity. IVC: No abnormality visualized. Pancreas: Visualized portion unremarkable. Spleen: Size and appearance within normal limits. Right Kidney: Length: 10.5 cm. Echogenicity within normal limits. No mass or hydronephrosis visualized. Left Kidney: Length: 10.4 cm. Echogenicity within normal limits. No mass or hydronephrosis visualized. Abdominal aorta: No aneurysm visualized. Other findings: None. IMPRESSION: Cholecystectomy.  No significant abnormality. Electronically Signed   By: Franchot Gallo M.D.   On: 07/11/2016 14:51    Assessment & Plan:   There are no diagnoses linked to this encounter. I am having Ms. Behne maintain her b complex vitamins, (multivitamin,tx-minerals), metFORMIN, insulin glargine, cetirizine, DEPLIN, calcium-vitamin D, aspirin EC, BD PEN NEEDLE NANO U/F, ONETOUCH DELICA LANCETS 17H, VOLTAREN, pioglitazone, VICTOZA, LORazepam, ONETOUCH VERIO, furosemide, busPIRone, Liniments (SALONPAS PAIN RELIEF PATCH EX), lovastatin, conjugated estrogens, estradiol, dicyclomine, Testosterone, traMADol-acetaminophen, pantoprazole, TRINTELLIX, Magnesium, and linaclotide.  No orders of the defined types were placed in this encounter.    Follow-up: No  Follow-up on file.  Walker Kehr, MD

## 2017-01-01 NOTE — Assessment & Plan Note (Signed)
Miralax, Linzess

## 2017-01-01 NOTE — Assessment & Plan Note (Addendum)
Chronic  Diabetic gastroparesis Info   Stop one medicine at the time for 3-4 days to see if better (as we discussed)

## 2017-01-01 NOTE — Assessment & Plan Note (Signed)
CFS 

## 2017-01-01 NOTE — Patient Instructions (Addendum)
Gastroparesis Gastroparesis, also called delayed gastric emptying, is a condition in which food takes longer than normal to empty from the stomach. The condition is usually long-lasting (chronic). What are the causes? This condition may be caused by:  An endocrine disorder, such as hypothyroidism or diabetes. Diabetes is the most common cause of this condition.  A nervous system disease, such as Parkinson disease or multiple sclerosis.  Cancer, infection, or surgery of the stomach or vagus nerve.  A connective tissue disorder, such as scleroderma.  Certain medicines. In most cases, the cause is not known. What increases the risk? This condition is more likely to develop in:  People with certain disorders, including endocrine disorders, eating disorders, amyloidosis, and scleroderma.  People with certain diseases, including Parkinson disease or multiple sclerosis.  People with cancer or infection of the stomach or vagus nerve.  People who have had surgery on the stomach or vagus nerve.  People who take certain medicines.  Women. What are the signs or symptoms? Symptoms of this condition include:  An early feeling of fullness when eating.  Nausea.  Weight loss.  Vomiting.  Heartburn.  Abdominal bloating.  Inconsistent blood glucose levels.  Lack of appetite.  Acid from the stomach coming up into the esophagus (gastroesophageal reflux).  Spasms of the stomach. Symptoms may come and go. How is this diagnosed? This condition is diagnosed with tests, such as:  Tests that check how long it takes food to move through the stomach and intestines. These tests include:  Upper gastrointestinal (GI) series. In this test, X-rays of the intestines are taken after you drink a liquid. The liquid makes the intestines show up better on the X-rays.  Gastric emptying scintigraphy. In this test, scans are taken after you eat food that contains a small amount of radioactive  material.  Wireless capsule GI monitoring system. This test involves swallowing a capsule that records information about movement through the stomach.  Gastric manometry. This test measures electrical and muscular activity in the stomach. It is done with a thin tube that is passed down the throat and into the stomach.  Endoscopy. This test checks for abnormalities in the lining of the stomach. It is done with a long, thin tube that is passed down the throat and into the stomach.  An ultrasound. This test can help rule out gallbladder disease or pancreatitis as a cause of your symptoms. It uses sound waves to take pictures of the inside of your body. How is this treated? There is no cure for gastroparesis. This condition may be managed with:  Treatment of the underlying condition causing the gastroparesis.  Lifestyle changes, including exercise and dietary changes. Dietary changes can include:  Changes in what and when you eat.  Eating smaller meals more often.  Eating low-fat foods.  Eating low-fiber forms of high-fiber foods, such as cooked vegetables instead of raw vegetables.  Having liquid foods in place of solid foods. Liquid foods are easier to digest.  Medicines. These may be given to control nausea and vomiting and to stimulate stomach muscles.  Getting food through a feeding tube. This may be done in severe cases.  A gastric neurostimulator. This is a device that is inserted into the body with surgery. It helps improve stomach emptying and control nausea and vomiting. Follow these instructions at home:  Follow your health care provider's instructions about exercise and diet.  Take medicines only as directed by your health care provider. Contact a health care provider if:  Your symptoms do not improve with treatment.  You have new symptoms. Get help right away if:  You have severe abdominal pain that does not improve with treatment.  You have nausea that does not  go away.  You cannot keep fluids down. This information is not intended to replace advice given to you by your health care provider. Make sure you discuss any questions you have with your health care provider. Document Released: 08/12/2005 Document Revised: 01/18/2016 Document Reviewed: 08/08/2014 Elsevier Interactive Patient Education  2017 [employer].  Stop one medicine at the time for 3-4 days to see if better (as we discussed)

## 2017-01-02 ENCOUNTER — Ambulatory Visit (INDEPENDENT_AMBULATORY_CARE_PROVIDER_SITE_OTHER): Payer: PPO | Admitting: Neurology

## 2017-01-02 VITALS — BP 138/76 | HR 83 | Ht 62.0 in | Wt 151.6 lb

## 2017-01-02 DIAGNOSIS — G43709 Chronic migraine without aura, not intractable, without status migrainosus: Secondary | ICD-10-CM | POA: Diagnosis not present

## 2017-01-02 MED ORDER — PREGABALIN 50 MG PO CAPS: 50.0000 mg | ORAL_CAPSULE | Freq: Two times a day (BID) | ORAL | 5 refills | Status: DC

## 2017-01-02 NOTE — Progress Notes (Signed)
Botox 100 units/vial x 2 vials from office supply (B/B) NDC 1364-3837-79 Lot [id number] Exp 12 2020  Diluted in 4 ml of Bacteriostatic 0.9% NaCl NDC 8472-0721-82 Lot [id number] Exp [id number]  Topical pain reliever applied to injection areas w/ gauze Lidocaine 5% cream NDC 14604-799-87 Lot [id number] Exp 06/19

## 2017-01-02 NOTE — Patient Instructions (Signed)
Pregabalin capsules What is this medicine? PREGABALIN (pre GAB a lin) is used to treat nerve pain from diabetes, shingles, spinal cord injury, and fibromyalgia. It is also used to control seizures in epilepsy. This medicine may be used for other purposes; ask your health care provider or pharmacist if you have questions. COMMON BRAND NAME(S): Lyrica What should I tell my health care provider before I take this medicine? They need to know if you have any of these conditions: -bleeding problems -heart disease, including heart failure -history of alcohol or drug abuse -kidney disease -suicidal thoughts, plans, or attempt; a previous suicide attempt by you or a family member -an unusual or allergic reaction to pregabalin, gabapentin, other medicines, foods, dyes, or preservatives -pregnant or trying to get pregnant or trying to conceive with your partner -breast-feeding How should I use this medicine? Take this medicine by mouth with a glass of water. Follow the directions on the prescription label. You can take this medicine with or without food. Take your doses at regular intervals. Do not take your medicine more often than directed. Do not stop taking except on your doctor's advice. A special MedGuide will be given to you by the pharmacist with each prescription and refill. Be sure to read this information carefully each time. Talk to your pediatrician regarding the use of this medicine in children. Special care may be needed. Overdosage: If you think you have taken too much of this medicine contact a poison control center or emergency room at once. NOTE: This medicine is only for you. Do not share this medicine with others. What if I miss a dose? If you miss a dose, take it as soon as you can. If it is almost time for your next dose, take only that dose. Do not take double or extra doses. What may interact with this medicine? -alcohol -certain medicines for blood pressure like captopril,  enalapril, or lisinopril -certain medicines for diabetes, like pioglitazone or rosiglitazone -certain medicines for anxiety or sleep -narcotic medicines for pain This list may not describe all possible interactions. Give your health care provider a list of all the medicines, herbs, non-prescription drugs, or dietary supplements you use. Also tell them if you smoke, drink alcohol, or use illegal drugs. Some items may interact with your medicine. What should I watch for while using this medicine? Tell your doctor or healthcare professional if your symptoms do not start to get better or if they get worse. Visit your doctor or health care professional for regular checks on your progress. Do not stop taking except on your doctor's advice. You may develop a severe reaction. Your doctor will tell you how much medicine to take. Wear a medical identification bracelet or chain if you are taking this medicine for seizures, and carry a card that describes your disease and details of your medicine and dosage times. You may get drowsy or dizzy. Do not drive, use machinery, or do anything that needs mental alertness until you know how this medicine affects you. Do not stand or sit up quickly, especially if you are an older patient. This reduces the risk of dizzy or fainting spells. Alcohol may interfere with the effect of this medicine. Avoid alcoholic drinks. If you have a heart condition, like congestive heart failure, and notice that you are retaining water and have swelling in your hands or feet, contact your health care provider immediately. The use of this medicine may increase the chance of suicidal thoughts or actions. Pay special attention   to how you are responding while on this medicine. Any worsening of mood, or thoughts of suicide or dying should be reported to your health care professional right away. This medicine has caused reduced sperm counts in some men. This may interfere with the ability to father a  child. You should talk to your doctor or health care professional if you are concerned about your fertility. Women who become pregnant while using this medicine for seizures may enroll in the [place] Antiepileptic Drug Pregnancy Registry by calling 1-888-233-2334. This registry collects information about the safety of antiepileptic drug use during pregnancy. What side effects may I notice from receiving this medicine? Side effects that you should report to your doctor or health care professional as soon as possible: -allergic reactions like skin rash, itching or hives, swelling of the face, lips, or tongue -breathing problems -changes in vision -chest pain -confusion -jerking or unusual movements of any part of your body -loss of memory -muscle pain, tenderness, or weakness -suicidal thoughts or other mood changes -swelling of the ankles, feet, hands -unusual bruising or bleeding Side effects that usually do not require medical attention (report to your doctor or health care professional if they continue or are bothersome): -dizziness -drowsiness -dry mouth -headache -nausea -tremors -trouble sleeping -weight gain This list may not describe all possible side effects. Call your doctor for medical advice about side effects. You may report side effects to FDA at 1-800-FDA-1088. Where should I keep my medicine? Keep out of the reach of children. This medicine can be abused. Keep your medicine in a safe place to protect it from theft. Do not share this medicine with anyone. Selling or giving away this medicine is dangerous and against the law. This medicine may cause accidental overdose and death if it taken by other adults, children, or pets. Mix any unused medicine with a substance like cat litter or coffee grounds. Then throw the medicine away in a sealed container like a sealed bag or a coffee can with a lid. Do not use the medicine after the expiration date. Store at room  temperature between 15 and 30 degrees C (59 and 86 degrees F). NOTE: This sheet is a summary. It may not cover all possible information. If you have questions about this medicine, talk to your doctor, pharmacist, or health care provider.  2018 Elsevier/Gold Standard (2015-09-14 10:26:12)  

## 2017-01-02 NOTE — Progress Notes (Signed)
Lyrica rx printed, signed and faxed to pharmacy.

## 2017-01-02 NOTE — Progress Notes (Signed)

## 2017-01-03 DIAGNOSIS — G4733 Obstructive sleep apnea (adult) (pediatric): Secondary | ICD-10-CM | POA: Diagnosis not present

## 2017-01-07 DIAGNOSIS — M47816 Spondylosis without myelopathy or radiculopathy, lumbar region: Secondary | ICD-10-CM | POA: Diagnosis not present

## 2017-01-16 ENCOUNTER — Other Ambulatory Visit: Payer: Self-pay | Admitting: Internal Medicine

## 2017-01-17 DIAGNOSIS — J3089 Other allergic rhinitis: Secondary | ICD-10-CM | POA: Diagnosis not present

## 2017-01-17 DIAGNOSIS — J301 Allergic rhinitis due to pollen: Secondary | ICD-10-CM | POA: Diagnosis not present

## 2017-01-28 ENCOUNTER — Other Ambulatory Visit: Payer: Self-pay | Admitting: Women's Health

## 2017-01-28 DIAGNOSIS — M48061 Spinal stenosis, lumbar region without neurogenic claudication: Secondary | ICD-10-CM | POA: Diagnosis not present

## 2017-01-28 DIAGNOSIS — J301 Allergic rhinitis due to pollen: Secondary | ICD-10-CM | POA: Diagnosis not present

## 2017-01-28 DIAGNOSIS — J3089 Other allergic rhinitis: Secondary | ICD-10-CM | POA: Diagnosis not present

## 2017-01-31 ENCOUNTER — Telehealth: Payer: Self-pay | Admitting: Internal Medicine

## 2017-01-31 NOTE — Telephone Encounter (Signed)
ROI fax to Dr. Legrand Como [place]

## 2017-02-03 DIAGNOSIS — J301 Allergic rhinitis due to pollen: Secondary | ICD-10-CM | POA: Diagnosis not present

## 2017-02-03 DIAGNOSIS — J3089 Other allergic rhinitis: Secondary | ICD-10-CM | POA: Diagnosis not present

## 2017-02-03 DIAGNOSIS — G4733 Obstructive sleep apnea (adult) (pediatric): Secondary | ICD-10-CM | POA: Diagnosis not present

## 2017-02-05 ENCOUNTER — Ambulatory Visit: Payer: PPO | Admitting: Family

## 2017-02-05 ENCOUNTER — Other Ambulatory Visit: Payer: PPO

## 2017-02-05 DIAGNOSIS — J3089 Other allergic rhinitis: Secondary | ICD-10-CM | POA: Diagnosis not present

## 2017-02-05 DIAGNOSIS — J301 Allergic rhinitis due to pollen: Secondary | ICD-10-CM | POA: Diagnosis not present

## 2017-02-10 ENCOUNTER — Other Ambulatory Visit: Payer: PPO

## 2017-02-10 ENCOUNTER — Ambulatory Visit: Payer: PPO | Admitting: Family

## 2017-02-11 DIAGNOSIS — Z96651 Presence of right artificial knee joint: Secondary | ICD-10-CM | POA: Diagnosis not present

## 2017-02-11 DIAGNOSIS — J301 Allergic rhinitis due to pollen: Secondary | ICD-10-CM | POA: Diagnosis not present

## 2017-02-11 DIAGNOSIS — M25561 Pain in right knee: Secondary | ICD-10-CM | POA: Diagnosis not present

## 2017-02-11 DIAGNOSIS — J3089 Other allergic rhinitis: Secondary | ICD-10-CM | POA: Diagnosis not present

## 2017-02-11 DIAGNOSIS — G8929 Other chronic pain: Secondary | ICD-10-CM | POA: Diagnosis not present

## 2017-02-14 ENCOUNTER — Ambulatory Visit: Payer: PPO | Admitting: Endocrinology

## 2017-02-18 ENCOUNTER — Other Ambulatory Visit: Payer: Self-pay | Admitting: Neurology

## 2017-02-18 DIAGNOSIS — J3089 Other allergic rhinitis: Secondary | ICD-10-CM | POA: Diagnosis not present

## 2017-02-18 DIAGNOSIS — J301 Allergic rhinitis due to pollen: Secondary | ICD-10-CM | POA: Diagnosis not present

## 2017-02-18 DIAGNOSIS — M48061 Spinal stenosis, lumbar region without neurogenic claudication: Secondary | ICD-10-CM | POA: Diagnosis not present

## 2017-02-18 MED ORDER — SUMATRIPTAN SUCCINATE 100 MG PO TABS: 100.0000 mg | ORAL_TABLET | Freq: Once | ORAL | 11 refills | Status: DC | PRN

## 2017-02-18 NOTE — Addendum Note (Signed)
Addended by: Monte Fantasia on: 02/18/2017 06:22 PM   Modules accepted: Orders

## 2017-02-18 NOTE — Telephone Encounter (Signed)
Patient called office in reference to headache she has has since this morning.  After eating and taking advil it has not helped along with slight nausea.  Pharmacy-  [place] [facility]

## 2017-02-18 NOTE — Telephone Encounter (Signed)
Pt had Botox injections 01/02/17 for migraines, was also started then on Lyrica 50 mg BID. She had migraine cocktail on 12/24/16 and saw Dr. Rexene Alberts 12/11/16 for OSA on Cpap. C/o HA and slight nausea today.

## 2017-02-18 NOTE — Telephone Encounter (Signed)
Delsa Sale, please call her back and get more info. How have her headaches been since starting the botox? Are they still daily? If they have improved from daily then we can possibly start a triptan for acute management. If her headaches are still severe and intractable I would offer her Aimovig at this time. thanks

## 2017-02-18 NOTE — Telephone Encounter (Signed)
Called pt who said that she is feeling much better this evening. Reports a terrible HA this morning for which she took OTC medication. Feels that the Botox injections have been very helpful and would like to schedule her next appt. Says that she has not started Lyrica as she is fearful of the medicine's side effects. Would prefer having a rescue medication on hand to take only when she needs it instead of taking something every day. Triptan rx e-scribed to pt's verified pharmacy. Pt verbalized understanding and appreciation for call.

## 2017-02-19 ENCOUNTER — Ambulatory Visit: Payer: PPO | Admitting: Internal Medicine

## 2017-02-24 MED ORDER — SUMATRIPTAN SUCCINATE 100 MG PO TABS: 100.0000 mg | ORAL_TABLET | Freq: Once | ORAL | 11 refills | Status: DC | PRN

## 2017-02-24 NOTE — Telephone Encounter (Signed)
Rx resent to correct pharmacy

## 2017-02-24 NOTE — Addendum Note (Signed)
Addended by: Monte Fantasia on: 02/24/2017 09:47 AM   Modules accepted: Orders

## 2017-02-27 ENCOUNTER — Telehealth: Payer: Self-pay | Admitting: Endocrinology

## 2017-02-27 NOTE — Telephone Encounter (Signed)
Patient is rescheduled for an hour slot on 04/10/17 at 3pm. Please call and advise husband if there are any sooner appointments for the patient. I have put the patient on the wait list as well.

## 2017-02-27 NOTE — Telephone Encounter (Signed)
Please see what is available for patient if anything. Thank you! M

## 2017-02-28 NOTE — Telephone Encounter (Signed)
There is no spots currently to place this pt any sooner

## 2017-03-05 DIAGNOSIS — G4733 Obstructive sleep apnea (adult) (pediatric): Secondary | ICD-10-CM | POA: Diagnosis not present

## 2017-03-10 ENCOUNTER — Encounter: Payer: Self-pay | Admitting: Internal Medicine

## 2017-03-10 ENCOUNTER — Other Ambulatory Visit (INDEPENDENT_AMBULATORY_CARE_PROVIDER_SITE_OTHER): Payer: PPO

## 2017-03-10 ENCOUNTER — Ambulatory Visit (INDEPENDENT_AMBULATORY_CARE_PROVIDER_SITE_OTHER): Payer: PPO | Admitting: Internal Medicine

## 2017-03-10 DIAGNOSIS — R110 Nausea: Secondary | ICD-10-CM | POA: Diagnosis not present

## 2017-03-10 DIAGNOSIS — F3341 Major depressive disorder, recurrent, in partial remission: Secondary | ICD-10-CM | POA: Diagnosis not present

## 2017-03-10 DIAGNOSIS — R5382 Chronic fatigue, unspecified: Secondary | ICD-10-CM | POA: Diagnosis not present

## 2017-03-10 DIAGNOSIS — K219 Gastro-esophageal reflux disease without esophagitis: Secondary | ICD-10-CM | POA: Diagnosis not present

## 2017-03-10 DIAGNOSIS — E118 Type 2 diabetes mellitus with unspecified complications: Secondary | ICD-10-CM | POA: Diagnosis not present

## 2017-03-10 LAB — HEPATIC FUNCTION PANEL
ALBUMIN: 4.1 g/dL (ref 3.5–5.2)
ALT: 14 U/L (ref 0–35)
AST: 20 U/L (ref 0–37)
Alkaline Phosphatase: 56 U/L (ref 39–117)
BILIRUBIN TOTAL: 0.3 mg/dL (ref 0.2–1.2)
Bilirubin, Direct: 0.1 mg/dL (ref 0.0–0.3)
TOTAL PROTEIN: 6.7 g/dL (ref 6.0–8.3)

## 2017-03-10 LAB — BASIC METABOLIC PANEL
BUN: 15 mg/dL (ref 6–23)
CALCIUM: 10 mg/dL (ref 8.4–10.5)
CO2: 31 mEq/L (ref 19–32)
Chloride: 96 mEq/L (ref 96–112)
Creatinine, Ser: 1.02 mg/dL (ref 0.40–1.20)
GFR: 56.07 mL/min — ABNORMAL LOW (ref >60.00)
Glucose, Bld: 146 mg/dL — ABNORMAL HIGH (ref 70–99)
POTASSIUM: 4.2 meq/L (ref 3.5–5.1)
Sodium: 134 mEq/L — ABNORMAL LOW (ref 135–145)

## 2017-03-10 LAB — LIPASE: LIPASE: 22 U/L (ref 11.0–59.0)

## 2017-03-10 LAB — URINALYSIS
Bilirubin Urine: NEGATIVE
HGB URINE DIPSTICK: NEGATIVE
KETONES UR: NEGATIVE
Leukocytes, UA: NEGATIVE
Nitrite: NEGATIVE
Specific Gravity, Urine: <=1.005 — AB (ref 1.000–1.030)
TOTAL PROTEIN, URINE-UPE24: NEGATIVE
URINE GLUCOSE: NEGATIVE
UROBILINOGEN UA: 0.2 (ref 0.0–1.0)
pH: 6 (ref 5.0–8.0)

## 2017-03-10 LAB — HEMOGLOBIN A1C: HEMOGLOBIN A1C: 6.8 % — AB (ref 4.6–6.5)

## 2017-03-10 MED ORDER — ZOSTER VAC RECOMB ADJUVANTED 50 MCG/0.5ML IM SUSR: 0.5000 mL | Freq: Once | INTRAMUSCULAR | 1 refills | Status: AC

## 2017-03-10 MED ORDER — TRAMADOL-ACETAMINOPHEN 37.5-325 MG PO TABS: 1.0000 | ORAL_TABLET | Freq: Three times a day (TID) | ORAL | 3 refills | Status: DC | PRN

## 2017-03-10 NOTE — Assessment & Plan Note (Signed)
Dr Dahlia Byes, Metformin, Lantus

## 2017-03-10 NOTE — Assessment & Plan Note (Signed)
Try to reduce Victoza

## 2017-03-10 NOTE — Assessment & Plan Note (Signed)
On Trintellix, Buspar  °

## 2017-03-10 NOTE — Assessment & Plan Note (Signed)
Start Silver sneakers

## 2017-03-10 NOTE — Progress Notes (Signed)
Subjective:  Patient ID: [name], female    DOB: 28-Apr-1941  Age: 76 y.o. MRN: [id number]  CC: No chief complaint on file.   HPI [name] presents for HTN, DM, anemia, LBP, DM2 f/u. She has been getting epidural shots. C/o occ nausea  Outpatient Medications Prior to Visit  Medication Sig Dispense Refill  . aspirin EC 81 MG tablet Take 1 tablet (81 mg total) by mouth daily. 100 tablet 3  . b complex vitamins tablet Take 1 tablet by mouth daily. Vitamin supplement    . BD PEN NEEDLE NANO U/F 32G X 4 MM MISC USE AS DIRECTED TWICE DAILY.  3  . busPIRone (BUSPAR) 15 MG tablet Take 1 tablet by mouth 2 (two) times daily.    . calcium-vitamin D (OSCAL WITH D) 500-200 MG-UNIT per tablet Take 1 tablet by mouth daily with breakfast.    . cetirizine (ZYRTEC) 10 MG tablet Take 10 mg by mouth daily as needed for allergies.     Marland Kitchen conjugated estrogens (PREMARIN) vaginal cream Place 1 Applicatorful vaginally 2 (two) times a week. 42.5 g 4  . dicyclomine (BENTYL) 10 MG capsule TAKE 1 CAPSULE (10 MG TOTAL) BY MOUTH 3 (THREE) TIMES DAILY BEFORE MEALS. 270 capsule 1  . estradiol (CLIMARA - DOSED IN MG/24 HR) 0.025 mg/24hr patch Place 1 patch (0.025 mg total) onto the skin once a week. APPLY 1 PATCH ONTO SKIN ONCE WEEKLY 12 patch 4  . furosemide (LASIX) 20 MG tablet TAKE 1 TO 2 TABLETS BY MOUTH EVERY DAY (Patient taking differently: TAKE 1 TABLET BY MOUTH EVERY DAY) 60 tablet 5  . insulin glargine (LANTUS) 100 UNIT/ML injection Inject into the skin every evening. 8 OR 10 UNITS    . L-Methylfolate (DEPLIN) 15 MG TABS Take 15 mg by mouth daily. For treatment of depression    . linaclotide (LINZESS) 145 MCG CAPS capsule Take 1 capsule (145 mcg total) by mouth daily. 30 capsule 11  . Liniments (SALONPAS PAIN RELIEF PATCH EX) Apply 1 patch topically at bedtime.    Marland Kitchen LORazepam (ATIVAN) 1 MG tablet Take 1 mg by mouth 4 (four) times daily as needed for anxiety.   2  . lovastatin (MEVACOR) 20 MG tablet  TAKE 1 TABLET BY MOUTH EVERY DAY **NEED OV** (Patient taking differently: 1 TAB EVERY OTHER DAY) 90 tablet 3  . Magnesium 500 MG CAPS Take 1 capsule by mouth daily.    . metFORMIN (GLUCOPHAGE-XR) 500 MG 24 hr tablet Take 1,000 mg by mouth 2 (two) times daily. For DM type 2    . Multiple Vitamins-Minerals (MULTIVITAMIN,TX-MINERALS) tablet Take 1 tablet by mouth daily.     Glory Rosebush DELICA LANCETS 57S MISC USE UTD UP TO QID  1  . ONETOUCH VERIO test strip     . pantoprazole (PROTONIX) 40 MG tablet TAKE 1 TABLET BY MOUTH TWICE A DAY 180 tablet 1  . pioglitazone (ACTOS) 45 MG tablet Take 45 mg by mouth daily.  3  . SUMAtriptan (IMITREX) 100 MG tablet Take 1 tablet (100 mg total) by mouth once as needed for migraine. May repeat in 2 hours if headache persists or recurs. 10 tablet 11  . Testosterone POWD APPLY A TINY PEA-SIZED AMOUNT ONCE A DAY. 30 g 0  . traMADol-acetaminophen (ULTRACET) 37.5-325 MG tablet TAKE 1 TABLET BY MOUTH EVERY 8 HOURS AS NEEDED 90 tablet 3  . TRINTELLIX 20 MG TABS Take 20 mg by mouth daily.  2  . Donna Bernard  18 MG/3ML SOPN ADM 1.2 MG [place] QD  0  . VOLTAREN 1 % GEL APPLY 2 GRAMS TO KNEE 2-3 TIMES DAILY PRN  1  . pregabalin (LYRICA) 50 MG capsule Take 1 capsule (50 mg total) by mouth 2 (two) times daily. 60 capsule 5   No facility-administered medications prior to visit.     ROS Review of Systems  Constitutional: Positive for fatigue. Negative for activity change, appetite change, chills and unexpected weight change.  HENT: Negative for congestion, mouth sores and sinus pressure.   Eyes: Negative for visual disturbance.  Respiratory: Negative for cough and chest tightness.   Gastrointestinal: Negative for abdominal pain and nausea.  Genitourinary: Negative for difficulty urinating, frequency and vaginal pain.  Musculoskeletal: Positive for arthralgias and back pain. Negative for gait problem.  Skin: Negative for pallor and rash.  Neurological: Negative for dizziness,  tremors, weakness, numbness and headaches.  Psychiatric/Behavioral: Negative for confusion, sleep disturbance and suicidal ideas. The patient is nervous/anxious.     Objective:  BP 130/78 (BP Location: Left Arm, Patient Position: Sitting, Cuff Size: Normal)   Pulse 90   Temp 97.8 F (36.6 C) (Oral)   Ht 5\' 2"  (1.575 m)   Wt 151 lb (68.5 kg)   SpO2 98%   BMI 27.62 kg/m   BP Readings from Last 3 Encounters:  03/10/17 130/78  01/02/17 138/76  01/01/17 130/76    Wt Readings from Last 3 Encounters:  03/10/17 151 lb (68.5 kg)  01/02/17 151 lb 9.6 oz (68.8 kg)  01/01/17 151 lb 1.9 oz (68.5 kg)    Physical Exam  Constitutional: She appears well-developed. No distress.  HENT:  Head: Normocephalic.  Right Ear: External ear normal.  Left Ear: External ear normal.  Nose: Nose normal.  Mouth/Throat: Oropharynx is clear and moist.  Eyes: Pupils are equal, round, and reactive to light. Conjunctivae are normal. Right eye exhibits no discharge. Left eye exhibits no discharge.  Neck: Normal range of motion. Neck supple. No JVD present. No tracheal deviation present. No thyromegaly present.  Cardiovascular: Normal rate, regular rhythm and normal heart sounds.   Pulmonary/Chest: No stridor. No respiratory distress. She has no wheezes.  Abdominal: Soft. Bowel sounds are normal. She exhibits no distension and no mass. There is no tenderness. There is no rebound and no guarding.  Musculoskeletal: She exhibits tenderness. She exhibits no edema.  Lymphadenopathy:    She has no cervical adenopathy.  Neurological: She displays normal reflexes. No cranial nerve deficit. She exhibits normal muscle tone. Coordination normal.  Skin: No rash noted. No erythema.  Psychiatric: She has a normal mood and affect. Her behavior is normal. Judgment and thought content normal.  LS tender  Lab Results  Component Value Date   WBC 7.9 11/25/2016   HGB 11.4 (L) 11/25/2016   HCT 33.4 (L) 11/25/2016   PLT 419  (H) 11/25/2016   GLUCOSE 195 (H) 10/11/2016   CHOL 119 05/10/2014   TRIG 77.0 05/10/2014   HDL 54.30 05/10/2014   LDLCALC 49 05/10/2014   ALT 17 10/11/2016   AST 22 10/11/2016   NA 134 (L) 10/11/2016   K 4.4 10/11/2016   CL 100 07/04/2016   CREATININE 1.2 (H) 10/11/2016   BUN 17.0 10/11/2016   CO2 22 10/11/2016   TSH 1.26 08/07/2010   INR 0.93 11/02/2013   HGBA1C 6.8 (H) 03/29/2015   MICROALBUR 0.2 11/07/2006    US Abdomen Complete  Result Date: 07/11/2016 CLINICAL DATA:  Nausea and vomiting. Constipation. GERD.  Cholecystectomy. EXAM: ABDOMEN ULTRASOUND COMPLETE COMPARISON:  Ultrasound abdomen 09/29/2007 FINDINGS: Gallbladder: Cholecystectomy Common bile duct: Diameter: 5.4 mm Liver: No focal lesion identified. Within normal limits in parenchymal echogenicity. IVC: No abnormality visualized. Pancreas: Visualized portion unremarkable. Spleen: Size and appearance within normal limits. Right Kidney: Length: 10.5 cm. Echogenicity within normal limits. No mass or hydronephrosis visualized. Left Kidney: Length: 10.4 cm. Echogenicity within normal limits. No mass or hydronephrosis visualized. Abdominal aorta: No aneurysm visualized. Other findings: None. IMPRESSION: Cholecystectomy.  No significant abnormality. Electronically Signed   By: Franchot Gallo M.D.   On: 07/11/2016 14:51    Assessment & Plan:   There are no diagnoses linked to this encounter. I have discontinued Ms. Bonus's pregabalin. I am also having her maintain her b complex vitamins, (multivitamin,tx-minerals), metFORMIN, insulin glargine, cetirizine, DEPLIN, calcium-vitamin D, aspirin EC, BD PEN NEEDLE NANO U/F, ONETOUCH DELICA LANCETS 97F, VOLTAREN, pioglitazone, VICTOZA, LORazepam, ONETOUCH VERIO, furosemide, busPIRone, Liniments (SALONPAS PAIN RELIEF PATCH EX), lovastatin, conjugated estrogens, estradiol, dicyclomine, traMADol-acetaminophen, pantoprazole, TRINTELLIX, Magnesium, linaclotide, Testosterone, SUMAtriptan, and  ondansetron.  Meds ordered this encounter  Medications  . ondansetron (ZOFRAN) 4 MG tablet    Sig: Take 4 mg by mouth every 8 (eight) hours as needed for nausea or vomiting.     Follow-up: No Follow-up on file.  Walker Kehr, MD

## 2017-03-12 DIAGNOSIS — J3089 Other allergic rhinitis: Secondary | ICD-10-CM | POA: Diagnosis not present

## 2017-03-12 DIAGNOSIS — M48061 Spinal stenosis, lumbar region without neurogenic claudication: Secondary | ICD-10-CM | POA: Diagnosis not present

## 2017-03-12 DIAGNOSIS — J301 Allergic rhinitis due to pollen: Secondary | ICD-10-CM | POA: Diagnosis not present

## 2017-03-14 ENCOUNTER — Ambulatory Visit (HOSPITAL_BASED_OUTPATIENT_CLINIC_OR_DEPARTMENT_OTHER): Payer: PPO | Admitting: Family

## 2017-03-14 ENCOUNTER — Other Ambulatory Visit (HOSPITAL_BASED_OUTPATIENT_CLINIC_OR_DEPARTMENT_OTHER): Payer: PPO

## 2017-03-14 ENCOUNTER — Ambulatory Visit: Payer: PPO | Admitting: Endocrinology

## 2017-03-14 VITALS — BP 144/70 | HR 91 | Temp 97.5°F | Resp 16 | Wt 150.0 lb

## 2017-03-14 DIAGNOSIS — E559 Vitamin D deficiency, unspecified: Secondary | ICD-10-CM | POA: Diagnosis not present

## 2017-03-14 DIAGNOSIS — D631 Anemia in chronic kidney disease: Secondary | ICD-10-CM | POA: Diagnosis not present

## 2017-03-14 DIAGNOSIS — D500 Iron deficiency anemia secondary to blood loss (chronic): Secondary | ICD-10-CM

## 2017-03-14 DIAGNOSIS — D62 Acute posthemorrhagic anemia: Secondary | ICD-10-CM

## 2017-03-14 DIAGNOSIS — N183 Chronic kidney disease, stage 3 (moderate): Secondary | ICD-10-CM

## 2017-03-14 DIAGNOSIS — R5383 Other fatigue: Secondary | ICD-10-CM

## 2017-03-14 LAB — CBC WITH DIFFERENTIAL (CANCER CENTER ONLY)
BASO#: 0.1 10e3/uL (ref 0.0–0.2)
BASO%: 0.6 % (ref 0.0–2.0)
EOS%: 0.7 % (ref 0.0–7.0)
Eosinophils Absolute: 0.1 10e3/uL (ref 0.0–0.5)
HCT: 35 % (ref 34.8–46.6)
HGB: 11.9 g/dL (ref 11.6–15.9)
LYMPH#: 3.5 10e3/uL — ABNORMAL HIGH (ref 0.9–3.3)
LYMPH%: 30.9 % (ref 14.0–48.0)
MCH: 33.4 pg (ref 26.0–34.0)
MCHC: 34 g/dL (ref 32.0–36.0)
MCV: 98 fL (ref 81–101)
MONO#: 1.2 10e3/uL — ABNORMAL HIGH (ref 0.1–0.9)
MONO%: 10.3 % (ref 0.0–13.0)
NEUT#: 6.5 10e3/uL (ref 1.5–6.5)
NEUT%: 57.5 % (ref 39.6–80.0)
Platelets: 391 10e3/uL (ref 145–400)
RBC: 3.56 10e6/uL — ABNORMAL LOW (ref 3.70–5.32)
RDW: 14.1 % (ref 11.1–15.7)
WBC: 11.3 10e3/uL — ABNORMAL HIGH (ref 3.9–10.0)

## 2017-03-14 NOTE — Progress Notes (Signed)
Hematology and Oncology Follow Up Visit  [name] [id number] 01-Feb-1941 76 y.o. 03/14/2017   Principle Diagnosis:  1. Iron deficiency anemia  2. Anemia of chronic kidney disease stage 3   Current Therapy:   IV iron as indicated - last received March 2018    Interim History:  [name] is here today with her husband for follow-up. She is still feeling fatigued at times. Her Hgb is stable at 11.9 with an MCV of 98. Iron studies are pending and I have added a Vit D level as well.  She denies any episodes of bleeding, bruising or petechiae. No lymphadenopathy found on exam.  She states that her blood sugars have been well controlled. Hgb A1c last week was 6.8.  No fever, chills, n/v, cough, rash, dizziness, SOB, chest pain, palpitations, abdominal pain or changes in bowel or bladder habits.  She has chronic ankle puffiness that waxes and wains. The neuropathy in her feet is unchanged. No c/o pain at this time.  She has maintained a good appetite and is staying well hydrated. Her weight is stable.  They are going to the beach with their family next week and then the following week her sone with be having surgery for his colon cancer in [place]. They plan to go there with his wife to be with him.  Her half sister was recently diagnosed with metastatic colon cancer and is currently in hospice. This has also been hard for them.   ECOG Performance Status: 1 - Symptomatic but completely ambulatory  Medications:  Allergies as of 03/14/2017      Reactions   Amlodipine Other (See Comments)   Dizzy, headaches   Celebrex [celecoxib] Other (See Comments)   headaches   Topamax [topiramate] Other (See Comments)   hallucinations   Codeine Nausea Only   Gabapentin Other (See Comments)   dizzy   Hydrocodone Nausea Only, Other (See Comments)   Feeling funny,    Meloxicam Other (See Comments)   jittery   Pravastatin Sodium Other (See Comments)   Muscle aches and pains      Medication List         Accurate as of 03/14/17  4:59 PM. Always use your most recent med list.          aspirin EC 81 MG tablet Take 1 tablet (81 mg total) by mouth daily.   b complex vitamins tablet Take 1 tablet by mouth daily. Vitamin supplement   BD PEN NEEDLE NANO U/F 32G X 4 MM Misc Generic drug:  Insulin Pen Needle USE AS DIRECTED TWICE DAILY.   busPIRone 15 MG tablet Commonly known as:  BUSPAR Take 1 tablet by mouth 2 (two) times daily.   calcium-vitamin D 500-200 MG-UNIT tablet Commonly known as:  OSCAL WITH D Take 1 tablet by mouth daily with breakfast.   cetirizine 10 MG tablet Commonly known as:  ZYRTEC Take 10 mg by mouth daily as needed for allergies.   conjugated estrogens vaginal cream Commonly known as:  PREMARIN Place 1 Applicatorful vaginally 2 (two) times a week.   DEPLIN 15 MG Tabs Take 15 mg by mouth daily. For treatment of depression   dicyclomine 10 MG capsule Commonly known as:  BENTYL TAKE 1 CAPSULE (10 MG TOTAL) BY MOUTH 3 (THREE) TIMES DAILY BEFORE MEALS.   estradiol 0.025 mg/24hr patch Commonly known as:  CLIMARA - Dosed in mg/24 hr Place 1 patch (0.025 mg total) onto the skin once a week. APPLY 1 PATCH ONTO  SKIN ONCE WEEKLY   furosemide 20 MG tablet Commonly known as:  LASIX TAKE 1 TO 2 TABLETS BY MOUTH EVERY DAY   LANTUS 100 UNIT/ML injection Generic drug:  insulin glargine Inject into the skin every evening. 8 OR 10 UNITS   linaclotide 145 MCG Caps capsule Commonly known as:  LINZESS Take 1 capsule (145 mcg total) by mouth daily.   LORazepam 1 MG tablet Commonly known as:  ATIVAN Take 1 mg by mouth 4 (four) times daily as needed for anxiety.   lovastatin 20 MG tablet Commonly known as:  MEVACOR TAKE 1 TABLET BY MOUTH EVERY DAY **NEED OV**   Magnesium 500 MG Caps Take 1 capsule by mouth daily.   metFORMIN 500 MG 24 hr tablet Commonly known as:  GLUCOPHAGE-XR Take 1,000 mg by mouth 2 (two) times daily. For DM type 2    multivitamin,tx-minerals tablet Take 1 tablet by mouth daily.   ondansetron 4 MG tablet Commonly known as:  ZOFRAN Take 4 mg by mouth every 8 (eight) hours as needed for nausea or vomiting.   ONETOUCH DELICA LANCETS 06C Misc USE UTD UP TO QID   ONETOUCH VERIO test strip Generic drug:  glucose blood   pantoprazole 40 MG tablet Commonly known as:  PROTONIX TAKE 1 TABLET BY MOUTH TWICE A DAY   pioglitazone 45 MG tablet Commonly known as:  ACTOS Take 45 mg by mouth daily.   SALONPAS PAIN RELIEF PATCH EX Apply 1 patch topically at bedtime.   SUMAtriptan 100 MG tablet Commonly known as:  IMITREX Take 1 tablet (100 mg total) by mouth once as needed for migraine. May repeat in 2 hours if headache persists or recurs.   Testosterone Powd APPLY A TINY PEA-SIZED AMOUNT ONCE A DAY.   traMADol-acetaminophen 37.5-325 MG tablet Commonly known as:  ULTRACET Take 1 tablet by mouth every 8 (eight) hours as needed.   TRINTELLIX 20 MG Tabs Generic drug:  vortioxetine HBr Take 20 mg by mouth daily.   VICTOZA 18 MG/3ML Sopn Generic drug:  liraglutide ADM 1.2 MG [place] QD   VOLTAREN 1 % Gel Generic drug:  diclofenac sodium APPLY 2 GRAMS TO KNEE 2-3 TIMES DAILY PRN       Allergies:  Allergies  Allergen Reactions  . Amlodipine Other (See Comments)    Dizzy, headaches  . Celebrex [Celecoxib] Other (See Comments)    headaches  . Topamax [Topiramate] Other (See Comments)    hallucinations  . Codeine Nausea Only  . Gabapentin Other (See Comments)    dizzy  . Hydrocodone Nausea Only and Other (See Comments)    Feeling funny,   . Meloxicam Other (See Comments)    jittery  . Pravastatin Sodium Other (See Comments)    Muscle aches and pains    Past Medical History, Surgical history, Social history, and Family History were reviewed and updated.  Review of Systems: All other 10 point review of systems is negative.   Physical Exam:  weight is 150 lb (68 kg). Her oral temperature  is 97.5 F (36.4 C) (abnormal). Her blood pressure is 144/70 (abnormal) and her pulse is 91. Her respiration is 16 and oxygen saturation is 100%.   Wt Readings from Last 3 Encounters:  03/14/17 150 lb (68 kg)  03/10/17 151 lb (68.5 kg)  01/02/17 151 lb 9.6 oz (68.8 kg)    Ocular: Sclerae unicteric, pupils equal, round and reactive to light Ear-nose-throat: Oropharynx clear, dentition fair Lymphatic: No cervical, supraclavicular or axillary adenopathy Lungs  no rales or rhonchi, good excursion bilaterally Heart regular rate and rhythm, no murmur appreciated Abd soft, nontender, positive bowel sounds, no liver or spleen tip palpated on exam, no fluid wave  MSK no focal spinal tenderness, no joint edema Neuro: non-focal, well-oriented, appropriate affect Breasts: Deferred   Lab Results  Component Value Date   WBC 11.3 (H) 03/14/2017   HGB 11.9 03/14/2017   HCT 35.0 03/14/2017   MCV 98 03/14/2017   PLT 391 03/14/2017   Lab Results  Component Value Date   FERRITIN 184 11/25/2016   IRON 90 11/25/2016   TIBC 284 11/25/2016   UIBC 194 11/25/2016   IRONPCTSAT 32 11/25/2016   Lab Results  Component Value Date   RETICCTPCT 1.7 02/07/2006   RBC 3.56 (L) 03/14/2017   RETICCTABS 68.2 02/07/2006   No results found for: KPAFRELGTCHN, LAMBDASER, KAPLAMBRATIO No results found for: IGGSERUM, IGA, IGMSERUM No results found for: Kathrynn Ducking, MSPIKE, SPEI   Chemistry      Component Value Date/Time   NA 134 (L) 03/10/2017 1507   NA 134 (L) 10/11/2016 1039   K 4.2 03/10/2017 1507   K 4.4 10/11/2016 1039   CL 96 03/10/2017 1507   CO2 31 03/10/2017 1507   CO2 22 10/11/2016 1039   BUN 15 03/10/2017 1507   BUN 17.0 10/11/2016 1039   CREATININE 1.02 03/10/2017 1507   CREATININE 1.2 (H) 10/11/2016 1039      Component Value Date/Time   CALCIUM 10.0 03/10/2017 1507   CALCIUM 9.7 10/11/2016 1039   ALKPHOS 56 03/10/2017 1507   ALKPHOS 69  10/11/2016 1039   AST 20 03/10/2017 1507   AST 22 10/11/2016 1039   ALT 14 03/10/2017 1507   ALT 17 10/11/2016 1039   BILITOT 0.3 03/10/2017 1507   BILITOT 0.22 10/11/2016 1039      Impression and Plan: Ms. Popoca is a very pleasant 75 yo caucasian female with history of iron deficiency anemia and anemia of chronic kidney disease stage 3. She is still having some fatigued.  We will see what her iron studies and bring her back in next week for infusion if needed.  Vit D level is pending. She is currently taking a 2,000 unit supplement by mouth daily.  We will go ahead and plan to see her back in another 4 months for repeat lab work and follow-up.  Both she and her husband know to contact our office with any questions or concerns. We can certainly see her sooner if need be.   Eliezer Bottom, NP 7/20/20184:59 PM

## 2017-03-15 LAB — VITAMIN D 25 HYDROXY (VIT D DEFICIENCY, FRACTURES): Vitamin D, 25-Hydroxy: 55.3 ng/mL (ref 30.0–100.0)

## 2017-03-17 LAB — IRON AND TIBC
%SAT: 32 % (ref 21–57)
Iron: 93 ug/dL (ref 41–142)
TIBC: 290 ug/dL (ref 236–444)
UIBC: 197 ug/dL (ref 120–384)

## 2017-03-17 LAB — FERRITIN: Ferritin: 92 ng/ml (ref 9–269)

## 2017-03-27 ENCOUNTER — Encounter: Payer: Self-pay | Admitting: Internal Medicine

## 2017-03-31 NOTE — Telephone Encounter (Signed)
Patient has called back in regard to this.  Please contact back in regard to this.  [name]

## 2017-04-01 MED ORDER — VICTOZA 18 MG/3ML ~~LOC~~ SOPN: PEN_INJECTOR | SUBCUTANEOUS | 0 refills | Status: DC

## 2017-04-01 NOTE — Telephone Encounter (Signed)
LM notifying pt this was done

## 2017-04-02 ENCOUNTER — Telehealth: Payer: Self-pay | Admitting: Internal Medicine

## 2017-04-02 MED ORDER — VICTOZA 18 MG/3ML ~~LOC~~ SOPN: PEN_INJECTOR | SUBCUTANEOUS | 0 refills | Status: DC

## 2017-04-02 NOTE — Telephone Encounter (Signed)
Updated pharmacy resent to [facility].../lmb

## 2017-04-02 NOTE — Telephone Encounter (Signed)
Spouse called stating that victoza got sent to incorrect pharmacy.  Was sent to [facility] and needs to be sent to [facility].  Please mark this as primary pharmacy in chart.  Thanks.

## 2017-04-10 ENCOUNTER — Ambulatory Visit (INDEPENDENT_AMBULATORY_CARE_PROVIDER_SITE_OTHER): Payer: PPO | Admitting: Endocrinology

## 2017-04-10 ENCOUNTER — Encounter: Payer: Self-pay | Admitting: Endocrinology

## 2017-04-10 VITALS — BP 138/76 | HR 94 | Ht 62.0 in | Wt 149.6 lb

## 2017-04-10 DIAGNOSIS — E1142 Type 2 diabetes mellitus with diabetic polyneuropathy: Secondary | ICD-10-CM | POA: Diagnosis not present

## 2017-04-10 DIAGNOSIS — E1165 Type 2 diabetes mellitus with hyperglycemia: Secondary | ICD-10-CM | POA: Diagnosis not present

## 2017-04-10 DIAGNOSIS — R5383 Other fatigue: Secondary | ICD-10-CM

## 2017-04-10 DIAGNOSIS — Z794 Long term (current) use of insulin: Secondary | ICD-10-CM

## 2017-04-10 MED ORDER — REPAGLINIDE 1 MG PO TABS: ORAL_TABLET | ORAL | 3 refills | Status: DC

## 2017-04-10 NOTE — Patient Instructions (Addendum)
Check blood sugars on waking up  3-4/7   Also check blood sugars about 2 hours after a meal and do this after different meals by rotation  Recommended blood sugar levels on waking up is 90-130 and about 2 hours after meal is 130-160  Please bring your blood sugar monitor to each visit, thank you  Lantus 5 units for 1 week and then stop as long as am sugar stays <130  Repaglanide 1mg  just before dinner and take 2 with more carbs
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
## 2017-04-15 DIAGNOSIS — J301 Allergic rhinitis due to pollen: Secondary | ICD-10-CM | POA: Diagnosis not present

## 2017-04-15 DIAGNOSIS — J3089 Other allergic rhinitis: Secondary | ICD-10-CM | POA: Diagnosis not present

## 2017-04-16 ENCOUNTER — Encounter: Payer: Self-pay | Admitting: Women's Health

## 2017-04-16 ENCOUNTER — Ambulatory Visit (INDEPENDENT_AMBULATORY_CARE_PROVIDER_SITE_OTHER): Payer: PPO | Admitting: Women's Health

## 2017-04-16 VITALS — BP 126/80 | Ht 62.0 in | Wt 148.0 lb

## 2017-04-16 DIAGNOSIS — M8580 Other specified disorders of bone density and structure, unspecified site: Secondary | ICD-10-CM

## 2017-04-16 DIAGNOSIS — Z01419 Encounter for gynecological examination (general) (routine) without abnormal findings: Secondary | ICD-10-CM | POA: Diagnosis not present

## 2017-04-16 DIAGNOSIS — Z79890 Hormone replacement therapy (postmenopausal): Secondary | ICD-10-CM

## 2017-04-16 DIAGNOSIS — B379 Candidiasis, unspecified: Secondary | ICD-10-CM | POA: Diagnosis not present

## 2017-04-16 DIAGNOSIS — N952 Postmenopausal atrophic vaginitis: Secondary | ICD-10-CM

## 2017-04-16 MED ORDER — ESTROGENS, CONJUGATED 0.625 MG/GM VA CREA: 1.0000 | TOPICAL_CREAM | VAGINAL | 4 refills | Status: DC

## 2017-04-16 MED ORDER — FLUCONAZOLE 100 MG PO TABS: ORAL_TABLET | ORAL | 1 refills | Status: DC

## 2017-04-16 MED ORDER — ALENDRONATE SODIUM 70 MG PO TABS: 70.0000 mg | ORAL_TABLET | ORAL | 4 refills | Status: DC

## 2017-04-16 MED ORDER — ESTRADIOL 0.025 MG/24HR TD PTWK: 0.0250 mg | MEDICATED_PATCH | TRANSDERMAL | 4 refills | Status: DC

## 2017-04-16 MED ORDER — TESTOSTERONE POWD: 5 refills | Status: DC

## 2017-04-16 NOTE — Patient Instructions (Signed)
Alendronate oral solution What is this medicine? ALENDRONATE (a LEN droe nate) slows calcium loss from bones. It helps to make normal healthy bone and to slow bone loss in people with Paget's disease and osteoporosis. It may be used in others at risk for bone loss. This medicine may be used for other purposes; ask your health care provider or pharmacist if you have questions. COMMON BRAND NAME(S): Fosamax What should I tell my health care provider before I take this medicine? They need to know if you have any of these conditions: -esophagus, stomach, or intestine problems, like acid reflux or GERD -dental disease -kidney disease -low blood calcium -low vitamin D -problems swallowing -problems sitting or standing for 30 minutes -an unusual or allergic reaction to alendronate, other medicines, foods, dyes, or preservatives -pregnant or trying to get pregnant -breast-feeding How should I use this medicine? You must take this medicine exactly as directed or you will lower the amount of the medicine you absorb into your body or you may cause yourself harm. Take your dose by mouth first thing in the morning, after you are up for the day. Do not eat or drink anything before you take this solution. Use a specially marked spoon or container to measure the oral solution. Take the solution with 2 fluid ounces (1/4 cup) of plain water. Do not take the solution with any other drink. After taking this medicine do not eat breakfast, drink, or take any other medicines or vitamins for at least 30 minutes. Stand or sit up for at least 30 minutes after you take this medicine; do not lie down. Do not take your medicine more often than directed. Take this medicine on the same day every week. Talk to your pediatrician regarding the use of this medicine in children. Special care may be needed. Overdosage: If you think you have taken too much of this medicine contact a poison control center or emergency room at  once. NOTE: This medicine is only for you. Do not share this medicine with others. What if I miss a dose? If you miss a dose, take the dose on the morning after you remember. Then take your next dose on your regular day of the week. Never take 2 doses on the same day. Do not take double or extra doses. What may interact with this medicine? -aluminum hydroxide -antacids -aspirin -calcium supplements -drugs for inflammation like ibuprofen, naproxen, and others -iron supplements -magnesium supplements -vitamins with minerals This list may not describe all possible interactions. Give your health care provider a list of all the medicines, herbs, non-prescription drugs, or dietary supplements you use. Also tell them if you smoke, drink alcohol, or use illegal drugs. Some items may interact with your medicine. What should I watch for while using this medicine? Visit your doctor for regular check ups. It may be some time before you see benefit from this medicine. Do not stop taking your medicine unless your doctor tells you to. Your doctor or health care professional may order regular blood tests to see how you are doing. You should make sure you get enough calcium and vitamin D in your diet while you are taking this medicine, unless your doctor tells you not to. Discuss the foods you eat and the vitamins you take with your health care professional. Some people who take this medicine have severe bone, joint, and/or muscle pain. This medicine may also increase your risk for a broken thigh bone. Tell your doctor right away if you  have pain in your upper leg or groin. Tell your doctor if you have any pain that does not go away or that gets worse. This medicine can make you more sensitive to the sun. If you get a rash while taking this medicine, sunlight may cause the rash to get worse. Keep out of the sun. If you cannot avoid being in the sun, wear protective clothing and use sunscreen. Do not use sun lamps  or tanning beds/booths. What side effects may I notice from receiving this medicine? Side effects that you should report to your doctor or health care professional as soon as possible: -allergic reactions like skin rash, itching or hives, swelling of the face, lips, or tongue -black or tarry stools -bone, muscle, or joint pain -changes in vision -chest pain -heartburn or stomach pain -jaw pain, especially after dental work -redness, blistering, peeling or loosening of the skin, including inside the mouth -trouble or pain when swallowing Side effects that usually do not require medical attention (report to your doctor or health care professional if they continue or are bothersome): -changes in taste -diarrhea or constipation -eye pain or itching -headache -nausea or vomiting -stomach gas or fullness This list may not describe all possible side effects. Call your doctor for medical advice about side effects. You may report side effects to FDA at 1-800-FDA-1088. Where should I keep my medicine? Keep out of the reach of children. Store at room temperature of 15 and 30 degrees C (59 and 86 degrees F). Throw away any unused medicine after the expiration date. NOTE: This sheet is a summary. It may not cover all possible information. If you have questions about this medicine, talk to your doctor, pharmacist, or health care provider.  2018 Elsevier/Gold Standard (2011-02-08 08:54:51) Health Maintenance for Postmenopausal Women Menopause is a normal process in which your reproductive ability comes to an end. This process happens gradually over a span of months to years, usually between the ages of 70 and 47. Menopause is complete when you have missed 12 consecutive menstrual periods. It is important to talk with your health care provider about some of the most common conditions that affect postmenopausal women, such as heart disease, cancer, and bone loss (osteoporosis). Adopting a healthy  lifestyle and getting preventive care can help to promote your health and wellness. Those actions can also lower your chances of developing some of these common conditions. What should I know about menopause? During menopause, you may experience a number of symptoms, such as:  Moderate-to-severe hot flashes.  Night sweats.  Decrease in sex drive.  Mood swings.  Headaches.  Tiredness.  Irritability.  Memory problems.  Insomnia.  Choosing to treat or not to treat menopausal changes is an individual decision that you make with your health care provider. What should I know about hormone replacement therapy and supplements? Hormone therapy products are effective for treating symptoms that are associated with menopause, such as hot flashes and night sweats. Hormone replacement carries certain risks, especially as you become older. If you are thinking about using estrogen or estrogen with progestin treatments, discuss the benefits and risks with your health care provider. What should I know about heart disease and stroke? Heart disease, heart attack, and stroke become more likely as you age. This may be due, in part, to the hormonal changes that your body experiences during menopause. These can affect how your body processes dietary fats, triglycerides, and cholesterol. Heart attack and stroke are both medical emergencies. There are many  things that you can do to help prevent heart disease and stroke:  Have your blood pressure checked at least every 1-2 years. High blood pressure causes heart disease and increases the risk of stroke.  If you are 25-94 years old, ask your health care provider if you should take aspirin to prevent a heart attack or a stroke.  Do not use any tobacco products, including cigarettes, chewing tobacco, or electronic cigarettes. If you need help quitting, ask your health care provider.  It is important to eat a healthy diet and maintain a healthy weight. ? Be  sure to include plenty of vegetables, fruits, low-fat dairy products, and lean protein. ? Avoid eating foods that are high in solid fats, added sugars, or salt (sodium).  Get regular exercise. This is one of the most important things that you can do for your health. ? Try to exercise for at least 150 minutes each week. The type of exercise that you do should increase your heart rate and make you sweat. This is known as moderate-intensity exercise. ? Try to do strengthening exercises at least twice each week. Do these in addition to the moderate-intensity exercise.  Know your numbers.Ask your health care provider to check your cholesterol and your blood glucose. Continue to have your blood tested as directed by your health care provider.  What should I know about cancer screening? There are several types of cancer. Take the following steps to reduce your risk and to catch any cancer development as early as possible. Breast Cancer  Practice breast self-awareness. ? This means understanding how your breasts normally appear and feel. ? It also means doing regular breast self-exams. Let your health care provider know about any changes, no matter how small.  If you are 68 or older, have a clinician do a breast exam (clinical breast exam or CBE) every year. Depending on your age, family history, and medical history, it may be recommended that you also have a yearly breast X-ray (mammogram).  If you have a family history of breast cancer, talk with your health care provider about genetic screening.  If you are at high risk for breast cancer, talk with your health care provider about having an MRI and a mammogram every year.  Breast cancer (BRCA) gene test is recommended for women who have family members with BRCA-related cancers. Results of the assessment will determine the need for genetic counseling and BRCA1 and for BRCA2 testing. BRCA-related cancers include these types: ? Breast. This occurs in  males or females. ? Ovarian. ? Tubal. This may also be called fallopian tube cancer. ? Cancer of the abdominal or pelvic lining (peritoneal cancer). ? Prostate. ? Pancreatic.  Cervical, Uterine, and Ovarian Cancer Your health care provider may recommend that you be screened regularly for cancer of the pelvic organs. These include your ovaries, uterus, and vagina. This screening involves a pelvic exam, which includes checking for microscopic changes to the surface of your cervix (Pap test).  For women ages 21-65, health care providers may recommend a pelvic exam and a Pap test every three years. For women ages 34-65, they may recommend the Pap test and pelvic exam, combined with testing for human papilloma virus (HPV), every five years. Some types of HPV increase your risk of cervical cancer. Testing for HPV may also be done on women of any age who have unclear Pap test results.  Other health care providers may not recommend any screening for nonpregnant women who are considered low risk  for pelvic cancer and have no symptoms. Ask your health care provider if a screening pelvic exam is right for you.  If you have had past treatment for cervical cancer or a condition that could lead to cancer, you need Pap tests and screening for cancer for at least 20 years after your treatment. If Pap tests have been discontinued for you, your risk factors (such as having a new sexual partner) need to be reassessed to determine if you should start having screenings again. Some women have medical problems that increase the chance of getting cervical cancer. In these cases, your health care provider may recommend that you have screening and Pap tests more often.  If you have a family history of uterine cancer or ovarian cancer, talk with your health care provider about genetic screening.  If you have vaginal bleeding after reaching menopause, tell your health care provider.  There are currently no reliable tests  available to screen for ovarian cancer.  Lung Cancer Lung cancer screening is recommended for adults 54-56 years old who are at high risk for lung cancer because of a history of smoking. A yearly low-dose CT scan of the lungs is recommended if you:  Currently smoke.  Have a history of at least 30 pack-years of smoking and you currently smoke or have quit within the past 15 years. A pack-year is smoking an average of one pack of cigarettes per day for one year.  Yearly screening should:  Continue until it has been 15 years since you quit.  Stop if you develop a health problem that would prevent you from having lung cancer treatment.  Colorectal Cancer  This type of cancer can be detected and can often be prevented.  Routine colorectal cancer screening usually begins at age 58 and continues through age 50.  If you have risk factors for colon cancer, your health care provider may recommend that you be screened at an earlier age.  If you have a family history of colorectal cancer, talk with your health care provider about genetic screening.  Your health care provider may also recommend using home test kits to check for hidden blood in your stool.  A small camera at the end of a tube can be used to examine your colon directly (sigmoidoscopy or colonoscopy). This is done to check for the earliest forms of colorectal cancer.  Direct examination of the colon should be repeated every 5-10 years until age 1. However, if early forms of precancerous polyps or small growths are found or if you have a family history or genetic risk for colorectal cancer, you may need to be screened more often.  Skin Cancer  Check your skin from head to toe regularly.  Monitor any moles. Be sure to tell your health care provider: ? About any new moles or changes in moles, especially if there is a change in a mole's shape or color. ? If you have a mole that is larger than the size of a pencil eraser.  If any  of your family members has a history of skin cancer, especially at a Kasara Schomer age, talk with your health care provider about genetic screening.  Always use sunscreen. Apply sunscreen liberally and repeatedly throughout the day.  Whenever you are outside, protect yourself by wearing long sleeves, pants, a wide-brimmed hat, and sunglasses.  What should I know about osteoporosis? Osteoporosis is a condition in which bone destruction happens more quickly than new bone creation. After menopause, you may be at  an increased risk for osteoporosis. To help prevent osteoporosis or the bone fractures that can happen because of osteoporosis, the following is recommended:  If you are 29-42 years old, get at least 1,000 mg of calcium and at least 600 mg of vitamin D per day.  If you are older than age 17 but younger than age 6, get at least 1,200 mg of calcium and at least 600 mg of vitamin D per day.  If you are older than age 6, get at least 1,200 mg of calcium and at least 800 mg of vitamin D per day.  Smoking and excessive alcohol intake increase the risk of osteoporosis. Eat foods that are rich in calcium and vitamin D, and do weight-bearing exercises several times each week as directed by your health care provider. What should I know about how menopause affects my mental health? Depression may occur at any age, but it is more common as you become older. Common symptoms of depression include:  Low or sad mood.  Changes in sleep patterns.  Changes in appetite or eating patterns.  Feeling an overall lack of motivation or enjoyment of activities that you previously enjoyed.  Frequent crying spells.  Talk with your health care provider if you think that you are experiencing depression. What should I know about immunizations? It is important that you get and maintain your immunizations. These include:  Tetanus, diphtheria, and pertussis (Tdap) booster vaccine.  Influenza every year before the flu  season begins.  Pneumonia vaccine.  Shingles vaccine.  Your health care provider may also recommend other immunizations. This information is not intended to replace advice given to you by your health care provider. Make sure you discuss any questions you have with your health care provider. Document Released: 10/04/2005 Document Revised: 03/01/2016 Document Reviewed: 05/16/2015 Elsevier Interactive Patient Education  2018 [employer].

## 2017-04-16 NOTE — Progress Notes (Signed)
[name] 1940-10-12 [id number]    History:    Presents for breast and pelvic exam. Normal Pap and mammogram history. 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 Has had difficulty stopping HRT due to numerous hot flashes and poor rest. Hypertension, primary care manages. 2017 negative colonoscopy History of IBS, Diabetes managed by endocrinologist. Current on vaccines. 2017 T score -2.4 at spine hip average -2 FRAX 13%/3.1%. Reports had a misunderstanding about returning to office to speak with Dr. Phineas Real concerning DEXA. Reports has low libido has used testosterone cream in the past, states does not help, states has chronic back pain. Has lost over 25 pounds and  kept it off.  Past medical history, past surgical history, family history and social history were all reviewed and documented in the EPIC chart. 3 sons and 8 granddaughters, 1 son is a Engineer, drilling.  ROS:  A ROS was performed and pertinent positives and negatives are included.  Exam:  Vitals:   04/16/17 1158  BP: 126/80  Weight: 148 lb (67.1 kg)  Height: 5\' 2"  (1.575 m)   Body mass index is 27.07 kg/m.   General appearance:  Normal Thyroid:  Symmetrical, normal in size, without palpable masses or nodularity. Respiratory  Auscultation:  Clear without wheezing or rhonchi Cardiovascular  Auscultation:  Regular rate, without rubs, murmurs or gallops  Edema/varicosities:  Not grossly evident Abdominal  Soft,nontender, without masses, guarding or rebound.  Liver/spleen:  No organomegaly noted  Hernia:  None appreciated  Skin  Inspection:  Grossly normal   Breasts: Examined lying and sitting.     Right: Without masses, retractions, discharge or axillary adenopathy.     Left: Without masses, retractions, discharge or axillary adenopathy. Gentitourinary   Inguinal/mons:  Normal without inguinal adenopathy  External genitalia:  Normal  BUS/Urethra/Skene's glands:  Normal  Vagina:  Normal Mild atrophy  Cervix:   Normal  Uterus:   normal in size, shape and contour.  Midline and mobile  Adnexa/parametria:     Rt: Without masses or tenderness.   Lt: Without masses or tenderness.  Anus and perineum: Normal  Digital rectal exam: Normal sphincter tone without palpated masses or tenderness  Assessment/Plan:  76 y.o. MWF G3 P3  for rest and pelvic exam with complaint of low libido.  TAH with BSO on HRT Diabetes on insulin-endocrinologist Hypertension primary care manages labs and meds Osteopenia with elevated FRAX Osteoarthritis/Degenerative joint disease/low back chronic pain-orthopedist manages  Plan: DEXA report reviewed reviewed elevated fracture risk of hip. Options reviewed. Of trying Fosamax 1 tablet weekly prescription given information given does have scheduled dental work will think about starting if decides does not want to start will call our office. Home safety, fall prevention and importance of weightbearing exercise reviewed. SBE's, continue annual screening mammogram, calcium rich diet, vitamin D 2000 daily encouraged. HRT reviewed less is best women's health initiative, will continue on estradiol 0.025 patch weekly, Premarin vaginal cream twice weekly, will stop the testosterone cream. Reviewed HRT risks of blood clots, strokes and breast cancer, states has unbearable hot flashes when off.     Huel Cote [facility], 1:02 PM 04/16/2017

## 2017-04-22 ENCOUNTER — Encounter: Payer: Self-pay | Admitting: Endocrinology

## 2017-04-22 DIAGNOSIS — M5136 Other intervertebral disc degeneration, lumbar region: Secondary | ICD-10-CM | POA: Diagnosis not present

## 2017-04-22 DIAGNOSIS — J3089 Other allergic rhinitis: Secondary | ICD-10-CM | POA: Diagnosis not present

## 2017-04-22 DIAGNOSIS — M533 Sacrococcygeal disorders, not elsewhere classified: Secondary | ICD-10-CM | POA: Diagnosis not present

## 2017-04-22 DIAGNOSIS — J301 Allergic rhinitis due to pollen: Secondary | ICD-10-CM | POA: Diagnosis not present

## 2017-04-23 ENCOUNTER — Ambulatory Visit: Payer: PPO | Admitting: Neurology

## 2017-04-23 ENCOUNTER — Other Ambulatory Visit: Payer: Self-pay

## 2017-04-23 MED ORDER — VICTOZA 18 MG/3ML ~~LOC~~ SOPN: PEN_INJECTOR | SUBCUTANEOUS | 1 refills | Status: DC

## 2017-04-24 ENCOUNTER — Other Ambulatory Visit: Payer: Self-pay | Admitting: Internal Medicine

## 2017-04-24 NOTE — Telephone Encounter (Signed)
Husband, Patrick Jupiter calling b/c patient needs [facility] to [facility] [address] in [place].  It was sent to [facility] which she only uses for certain scripts.  Ty,  -LL

## 2017-04-25 ENCOUNTER — Telehealth: Payer: Self-pay | Admitting: Neurology

## 2017-04-25 ENCOUNTER — Other Ambulatory Visit: Payer: Self-pay | Admitting: Psychiatry

## 2017-04-25 NOTE — Telephone Encounter (Signed)
Pt is asking for a call back re: the rescheduling of her Botox appointment, pt will not be available until 9-17 so she is asking to be called at that time for scheduling

## 2017-04-25 NOTE — Telephone Encounter (Signed)
MEDICATION: VICTOZA 18 MG/3ML SOPN  PHARMACY:  [id number] [place], [place] [phone] ([phone] (Fax)   IS THIS A 90 DAY SUPPLY : pharmacy did not state  IS PATIENT OUT OF MEDICATION: yes  IF NOT; HOW MUCH IS LEFT: n/a  LAST APPOINTMENT DATE:04/10/17  NEXT APPOINTMENT DATE:05/19/17  OTHER COMMENTS: medication was sent to the incorrect pharmacy, please correct and send to the pharmacy above, patient leaves to go out of town later today.   **Let patient know to contact pharmacy at the end of the day to make sure medication is ready. **  ** Please notify patient to allow 48-72 hours to process**  **Encourage patient to contact the pharmacy for refills or they can request refills through [facility]**

## 2017-04-29 ENCOUNTER — Telehealth: Payer: Self-pay | Admitting: Endocrinology

## 2017-04-29 ENCOUNTER — Other Ambulatory Visit: Payer: Self-pay | Admitting: Endocrinology

## 2017-04-29 NOTE — Telephone Encounter (Signed)
MEDICATION: VICTOZA 18 MG/3ML SOPN [[id number]]    PHARMACY:    [id number] [place], [place] - [address] [phone] (Phone) [phone] (Fax)     IS THIS A 90 DAY SUPPLY :  No   IS PATIENT OUT OF MEDICATION:  Almost  IF NOT; HOW MUCH IS LEFT:  2 days  OTHER COMMENTS: Patient is out of town, asking for refill to be sent to pharmacy listed in [place], [place] just this time. Please call patient when refill sent.    **Let patient know to contact pharmacy at the end of the day to make sure medication is ready. **  ** Please notify patient to allow 48-72 hours to process**  **Encourage patient to contact the pharmacy for refills or they can request refills through [facility]**

## 2017-05-05 ENCOUNTER — Other Ambulatory Visit: Payer: Self-pay

## 2017-05-05 MED ORDER — VICTOZA 18 MG/3ML ~~LOC~~ SOPN: PEN_INJECTOR | SUBCUTANEOUS | 1 refills | Status: DC

## 2017-05-05 NOTE — Telephone Encounter (Signed)
This has been ordered 

## 2017-05-07 DIAGNOSIS — M533 Sacrococcygeal disorders, not elsewhere classified: Secondary | ICD-10-CM | POA: Diagnosis not present

## 2017-05-07 DIAGNOSIS — J301 Allergic rhinitis due to pollen: Secondary | ICD-10-CM | POA: Diagnosis not present

## 2017-05-07 DIAGNOSIS — M5136 Other intervertebral disc degeneration, lumbar region: Secondary | ICD-10-CM | POA: Diagnosis not present

## 2017-05-07 DIAGNOSIS — J3089 Other allergic rhinitis: Secondary | ICD-10-CM | POA: Diagnosis not present

## 2017-05-09 ENCOUNTER — Encounter: Payer: PPO | Admitting: Dietician

## 2017-05-13 ENCOUNTER — Other Ambulatory Visit: Payer: Self-pay | Admitting: Women's Health

## 2017-05-13 DIAGNOSIS — Z1231 Encounter for screening mammogram for malignant neoplasm of breast: Secondary | ICD-10-CM

## 2017-05-15 ENCOUNTER — Telehealth: Payer: Self-pay | Admitting: Internal Medicine

## 2017-05-15 ENCOUNTER — Ambulatory Visit (INDEPENDENT_AMBULATORY_CARE_PROVIDER_SITE_OTHER): Payer: PPO | Admitting: General Practice

## 2017-05-15 DIAGNOSIS — Z23 Encounter for immunization: Secondary | ICD-10-CM

## 2017-05-15 DIAGNOSIS — J301 Allergic rhinitis due to pollen: Secondary | ICD-10-CM | POA: Diagnosis not present

## 2017-05-15 DIAGNOSIS — J3089 Other allergic rhinitis: Secondary | ICD-10-CM | POA: Diagnosis not present

## 2017-05-15 NOTE — Telephone Encounter (Signed)
Called pt to schedule AWV. Lvm for pt to call office to schedule appt. Last AWV 01/25/2015. Pt can schedule appt at anytime.
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
## 2017-05-19 ENCOUNTER — Encounter: Payer: Self-pay | Admitting: Endocrinology

## 2017-05-19 ENCOUNTER — Other Ambulatory Visit: Payer: Self-pay

## 2017-05-19 ENCOUNTER — Ambulatory Visit (INDEPENDENT_AMBULATORY_CARE_PROVIDER_SITE_OTHER): Payer: PPO | Admitting: Endocrinology

## 2017-05-19 VITALS — BP 122/72 | HR 81 | Ht 62.0 in | Wt 150.6 lb

## 2017-05-19 DIAGNOSIS — Z794 Long term (current) use of insulin: Secondary | ICD-10-CM | POA: Diagnosis not present

## 2017-05-19 DIAGNOSIS — R5383 Other fatigue: Secondary | ICD-10-CM

## 2017-05-19 DIAGNOSIS — E1165 Type 2 diabetes mellitus with hyperglycemia: Secondary | ICD-10-CM | POA: Diagnosis not present

## 2017-05-19 MED ORDER — VICTOZA 18 MG/3ML ~~LOC~~ SOPN: PEN_INJECTOR | SUBCUTANEOUS | 3 refills | Status: DC

## 2017-05-19 MED ORDER — ONETOUCH VERIO VI STRP: ORAL_STRIP | 3 refills | Status: DC

## 2017-05-19 MED ORDER — INSULIN LISPRO 100 UNIT/ML (KWIKPEN): PEN_INJECTOR | SUBCUTANEOUS | 1 refills | Status: DC

## 2017-05-19 MED ORDER — PIOGLITAZONE HCL 45 MG PO TABS: 45.0000 mg | ORAL_TABLET | Freq: Every day | ORAL | 3 refills | Status: DC

## 2017-05-19 MED ORDER — METFORMIN HCL ER 500 MG PO TB24: 1000.0000 mg | ORAL_TABLET | Freq: Two times a day (BID) | ORAL | 3 refills | Status: DC

## 2017-05-19 MED ORDER — BD PEN NEEDLE NANO U/F 32G X 4 MM MISC: 3 refills | Status: DC

## 2017-05-19 MED ORDER — ONETOUCH DELICA LANCETS 33G MISC: 3 refills | Status: DC

## 2017-05-19 NOTE — Patient Instructions (Addendum)
Humalog 4 units at least before supper, no Prandin  Check blood sugars on waking up weekly   Also check blood sugars about 2 hours after a meal and do this after different meals by rotation  Recommended blood sugar levels on waking up is 90-130 and about 2 hours after meal is 130-180  Please bring your blood sugar monitor to each visit, thank you

## 2017-05-20 ENCOUNTER — Encounter: Payer: Self-pay | Admitting: Neurology

## 2017-05-20 ENCOUNTER — Ambulatory Visit (INDEPENDENT_AMBULATORY_CARE_PROVIDER_SITE_OTHER): Payer: PPO | Admitting: Neurology

## 2017-05-20 VITALS — BP 118/66 | HR 91 | Ht 62.0 in | Wt 152.8 lb

## 2017-05-20 DIAGNOSIS — G43711 Chronic migraine without aura, intractable, with status migrainosus: Secondary | ICD-10-CM

## 2017-05-20 NOTE — Progress Notes (Signed)
Botox-100unitsx2 vials Lot: [id number] Expiration: 11/2019 NDC: 5397-6734-19 [id number]  0.9% Sodium Chloride- 35mL total Lot: [id number] Expiration: 09/2018 NDC: 4268-3419-62  Dx: I29.798 B/B

## 2017-05-20 NOTE — Progress Notes (Signed)
She had 3 migraines in July she took imitrex which helped, mild headaches 8   Consent Form Botulism Toxin Injection For Chronic Migraine  Botulism toxin has been approved by the Federal drug administration for treatment of chronic migraine. Botulism toxin does not cure chronic migraine and it may not be effective in some patients.  The administration of botulism toxin is accomplished by injecting a small amount of toxin into the muscles of the neck and head. Dosage must be titrated for each individual. Any benefits resulting from botulism toxin tend to wear off after 3 months with a repeat injection required if benefit is to be maintained. Injections are usually done every 3-4 months with maximum effect peak achieved by about 2 or 3 weeks. Botulism toxin is expensive and you should be sure of what costs you will incur resulting from the injection.  The side effects of botulism toxin use for chronic migraine may include:   -Transient, and usually mild, facial weakness with facial injections  -Transient, and usually mild, head or neck weakness with head/neck injections  -Reduction or loss of forehead facial animation due to forehead muscle              weakness  -Eyelid drooping  -Dry eye  -Pain at the site of injection or bruising at the site of injection  -Double vision  -Potential unknown long term risks  Contraindications: You should not have Botox if you are pregnant, nursing, allergic to albumin, have an infection, skin condition, or muscle weakness at the site of the injection, or have myasthenia gravis, Lambert-Eaton syndrome, or ALS.  It is also possible that as with any injection, there may be an allergic reaction or no effect from the medication. Reduced effectiveness after repeated injections is sometimes seen and rarely infection at the injection site may occur. All care will be taken to prevent these side effects. If therapy is given over a long time, atrophy and wasting in the  muscle injected may occur. Occasionally the patient's become refractory to treatment because they develop antibodies to the toxin. In this event, therapy needs to be modified.  I have read the above information and consent to the administration of botulism toxin.    ______________  _____   _________________  Patient signature     Date   Witness signature       BOTOX PROCEDURE NOTE FOR MIGRAINE HEADACHE    Contraindications and precautions discussed with patient(above). Aseptic procedure was observed and patient tolerated procedure. Procedure performed by Dr. Georgia Dom  The condition has existed for more than 6 months, and pt does not have a diagnosis of ALS, Myasthenia Gravis or Lambert-Eaton Syndrome. Risks and benefits of injections discussed and pt agrees to proceed with the procedure. Written consent obtained  These injections are medically necessary. He receives good benefits from these injections. These injections do not cause sedations or hallucinations which the oral therapies may cause.  Indication/Diagnosis: chronic migraine BOTOX(J0585) injection was performed according to protocol by [employer]. 200 units of BOTOX was dissolved into 4 cc NS.  NDC: [address]-52 Description of procedure:  The patient was placed in a sitting position. The standard protocol was used for Botox as follows, with 5 units of Botox injected at each site:   -Procerus muscle, midline injection  -Corrugator muscle, bilateral injection  -Frontalis muscle, bilateral injection, with 2 sites each side, medial injection was performed in the upper one third of the frontalis muscle, in the region vertical from the  medial inferior edge of the superior orbital rim. The lateral injection was again in the upper one third of the forehead vertically above the lateral limbus of the cornea, 1.5 cm lateral to the medial injection site.  -Temporalis muscle injection, 4 sites, bilaterally. The first injection  was 3 cm above the tragus of the ear, second injection site was 1.5 cm to 3 cm up from the first injection site in line with the tragus of the ear. The third injection site was 1.5-3 cm forward between the first 2 injection sites. The fourth injection site was 1.5 cm posterior to the second injection site.  -Occipitalis muscle injection, 3 sites, bilaterally. The first injection was done one half way between the occipital protuberance and the tip of the mastoid process behind the ear. The second injection site was done lateral and superior to the first, 1 fingerbreadth from the first injection. The third injection site was 1 fingerbreadth superiorly and medially from the first injection site.  -Cervical paraspinal muscle injection, 2 sites, bilateral knee first injection site was 1 cm from the midline of the cervical spine, 3 cm inferior to the lower border of the occipital protuberance. The second injection site was 1.5 cm superiorly and laterally to the first injection site.  -Trapezius muscle injection was performed at 3 sites, bilaterally. The first injection site was in the upper trapezius muscle halfway between the inflection point of the neck, and the acromion. The second injection site was one half way between the acromion and the first injection site. The third injection was done between the first injection site and the inflection point of the neck.   Will return for repeat injection in 3 months.   A 200 unit sof Botox was used, 155 units were injected, the rest of the Botox was wasted. The patient tolerated the procedure well, there were no complications of the above procedure.

## 2017-05-21 ENCOUNTER — Other Ambulatory Visit: Payer: Self-pay

## 2017-05-21 ENCOUNTER — Telehealth: Payer: Self-pay | Admitting: Endocrinology

## 2017-05-21 MED ORDER — INSULIN ASPART 100 UNIT/ML FLEXPEN: PEN_INJECTOR | SUBCUTANEOUS | 3 refills | Status: DC

## 2017-05-21 NOTE — Telephone Encounter (Signed)
Called pharmacy and let them know I sent a new script over for the Novolog Flexpen.

## 2017-05-21 NOTE — Telephone Encounter (Signed)
Pharmacy called in reference to sending over a request for patient to get Novalog instead of Humalog. Pharmacy stated they sent over the request on 05/19/17. Please call pharmacy and advise.

## 2017-05-22 ENCOUNTER — Encounter: Payer: Self-pay | Admitting: Dietician

## 2017-05-22 ENCOUNTER — Encounter: Payer: PPO | Attending: Internal Medicine | Admitting: Dietician

## 2017-05-22 ENCOUNTER — Other Ambulatory Visit: Payer: Self-pay

## 2017-05-22 DIAGNOSIS — Z794 Long term (current) use of insulin: Secondary | ICD-10-CM | POA: Diagnosis not present

## 2017-05-22 DIAGNOSIS — E1165 Type 2 diabetes mellitus with hyperglycemia: Secondary | ICD-10-CM | POA: Diagnosis not present

## 2017-05-22 DIAGNOSIS — Z713 Dietary counseling and surveillance: Secondary | ICD-10-CM | POA: Diagnosis not present

## 2017-05-22 DIAGNOSIS — E118 Type 2 diabetes mellitus with unspecified complications: Secondary | ICD-10-CM

## 2017-05-22 NOTE — Patient Instructions (Signed)
Consider comparing your pre and post meal blood sugar Be sure to rotate your insulin sites. Begin the Novolog with 4 units before supper. Consider protein options to add to breakfast. Aim for 30-45 grams of carbohydrate choices per meal (2-3 choices) Consider armchair [place].  Stay as active as possible. Low fat dinner (cooking method and added fats)

## 2017-05-23 NOTE — Progress Notes (Signed)
Diabetes Self-Management Education  Visit Type: First/Initial  Appt. Start Time: 1540 Appt. End Time: 1700  05/23/2017  [name], identified by name and date of birth, is a 76 y.o. female with a diagnosis of Diabetes: Type 2. Other history includes depression and anxiety, hyperlipidemia, GERD.  She is a patient of Dr. Dwyane Dee.   Medications include Metformin, Victoza, Actos, Novolog.  She has not started the Novolog yet as it was just prescribed and was going to pick this up at the pharmacy today.  She is also on several medications for anxiety and depression. MVI, B complex, vitamin D, and calcium. Weight 150 lbs which has been overall stable for the past 6 months.  Patient lives with her husband.  They often eat out or simple meals at home.  Patient has increased back problems and is unable to stand or walk for long periods of time.  Her husband has celiac. He is here today.    ASSESSMENT  Height 5\' 2"  (1.575 m), weight 150 lb (68 kg). Body mass index is 27.44 kg/m.      Diabetes Self-Management Education - 05/22/17 1606      Visit Information   Visit Type First/Initial     Initial Visit   Diabetes Type Type 2   Are you currently following a meal plan? No   Are you taking your medications as prescribed? Yes   Date Diagnosed around 10 years     Health Coping   How would you rate your overall health? Good     Psychosocial Assessment   Patient Belief/Attitude about Diabetes Motivated to manage diabetes   Self-care barriers None   Self-management support Doctor's office;Family   Other persons present Patient;Spouse/SO   Patient Concerns Nutrition/Meal planning   Special Needs None   Preferred Learning Style No preference indicated   Learning Readiness Ready   How often do you need to have someone help you when you read instructions, pamphlets, or other written materials from your doctor or pharmacy? 1 - Never   What is the last grade level you completed in school? 4  years college     Pre-Education Assessment   Patient understands the diabetes disease and treatment process. Needs Review   Patient understands incorporating nutritional management into lifestyle. Needs Review   Patient undertands incorporating physical activity into lifestyle. Needs Review   Patient understands using medications safely. Needs Review   Patient understands monitoring blood glucose, interpreting and using results Needs Review   Patient understands prevention, detection, and treatment of acute complications. Needs Review   Patient understands prevention, detection, and treatment of chronic complications. Needs Review   Patient understands how to develop strategies to address psychosocial issues. Needs Review   Patient understands how to develop strategies to promote health/change behavior. Needs Review     Complications   Last HgB A1C per patient/outside source 6.8 %  03/18/17   How often do you check your blood sugar? 3-4 times/day   Fasting Blood glucose range (mg/dL) 70-129   Number of hypoglycemic episodes per month 0   Number of hyperglycemic episodes per week 3   Can you tell when your blood sugar is high? No   What do you do if your blood sugar is high? nothing   Have you had a dilated eye exam in the past 12 months? Yes   Have you had a dental exam in the past 12 months? Yes   Are you checking your feet? Yes   How  many days per week are you checking your feet? 7     Dietary Intake   Breakfast store bought muffin, applesauce OR omelet and bacon  9   Snack (morning) none   Lunch cottage cheese and fruit, 3-4 NABS OR sandwich on occasion when out to eat, fruit, 7 animal crackers  1   Snack (afternoon) string cheese or crackers   Dinner eat out most of the time (cook-out burger OR gluten free pizza, chicken breast without skin, slaw, mashed potatoes, 1/2-1 biscuit from [employer] OR grilled chicken, green beans, carrot salad from cafeteria OR [employer] chicken  sandwich and fruit or chicken nuggets and fruit)  6   Snack (evening) rare frozen snickers ice cream bar   Beverage(s) water, unsweetened tea (with splenda or very small amount sweet tea or lemonade)     Exercise   Exercise Type ADL's   How many days per week to you exercise? 0   How many minutes per day do you exercise? 0   Total minutes per week of exercise 0     Patient Education   Previous Diabetes Education Yes (please comment)  when diagnosed   Nutrition management  Role of diet in the treatment of diabetes and the relationship between the three main macronutrients and blood glucose level;Food label reading, portion sizes and measuring food.;Meal options for control of blood glucose level and chronic complications.;Information on hints to eating out and maintain blood glucose control.;Meal timing in regards to the patients' current diabetes medication.;Carbohydrate counting;Reviewed blood glucose goals for pre and post meals and how to evaluate the patients' food intake on their blood glucose level.   Physical activity and exercise  Role of exercise on diabetes management, blood pressure control and cardiac health.;Helped patient identify appropriate exercises in relation to his/her diabetes, diabetes complications and other health issue.   Medications Reviewed patients medication for diabetes, action, purpose, timing of dose and side effects.;Taught/reviewed insulin injection, site rotation, insulin storage and needle disposal.   Monitoring Purpose and frequency of SMBG.;Identified appropriate SMBG and/or A1C goals.   Acute complications Taught treatment of hypoglycemia - the 15 rule.   Psychosocial adjustment Identified and addressed patients feelings and concerns about diabetes     Individualized Goals (developed by patient)   Nutrition Follow meal plan discussed   Physical Activity Exercise 3-5 times per week;15 minutes per day   Medications take my medication as prescribed    Monitoring  test my blood glucose as discussed   Reducing Risk examine blood glucose patterns   Health Coping discuss diabetes with (comment)  MD/RD     Post-Education Assessment   Patient understands the diabetes disease and treatment process. Demonstrates understanding / competency   Patient understands incorporating nutritional management into lifestyle. Demonstrates understanding / competency   Patient undertands incorporating physical activity into lifestyle. Demonstrates understanding / competency   Patient understands using medications safely. Demonstrates understanding / competency   Patient understands monitoring blood glucose, interpreting and using results Demonstrates understanding / competency   Patient understands prevention, detection, and treatment of acute complications. Demonstrates understanding / competency   Patient understands prevention, detection, and treatment of chronic complications. Demonstrates understanding / competency   Patient understands how to develop strategies to address psychosocial issues. Demonstrates understanding / competency   Patient understands how to develop strategies to promote health/change behavior. Demonstrates understanding / competency     Outcomes   Expected Outcomes Demonstrated interest in learning. Expect positive outcomes   Future DMSE PRN  Individualized Plan for Diabetes Self-Management Training:   Learning Objective:  Patient will have a greater understanding of diabetes self-management. Patient education plan is to attend individual and/or group sessions per assessed needs and concerns.   Plan:   Patient Instructions  Consider comparing your pre and post meal blood sugar Be sure to rotate your insulin sites. Begin the Novolog with 4 units before supper. Consider protein options to add to breakfast. Aim for 30-45 grams of carbohydrate choices per meal (2-3 choices) Consider armchair [place].  Stay as active as  possible. Low fat dinner (cooking method and added fats)   Expected Outcomes:  Demonstrated interest in learning. Expect positive outcomes  Education material provided: Living Well with Diabetes, Food label handouts, A1C conversion sheet, Meal plan card, My Plate and Snack sheet  If problems or questions, patient to contact team via:  Phone  Future DSME appointment: PRN

## 2017-05-26 ENCOUNTER — Ambulatory Visit
Admission: RE | Admit: 2017-05-26 | Discharge: 2017-05-26 | Disposition: A | Discharge status: Home / Self Care | Payer: PPO | Source: Ambulatory Visit | Attending: Women's Health | Admitting: Women's Health

## 2017-05-26 DIAGNOSIS — Z1231 Encounter for screening mammogram for malignant neoplasm of breast: Secondary | ICD-10-CM | POA: Diagnosis not present

## 2017-05-27 ENCOUNTER — Encounter: Payer: Self-pay | Admitting: Physician Assistant

## 2017-05-27 ENCOUNTER — Ambulatory Visit (INDEPENDENT_AMBULATORY_CARE_PROVIDER_SITE_OTHER): Payer: PPO | Admitting: Physician Assistant

## 2017-05-27 ENCOUNTER — Encounter: Payer: Self-pay | Admitting: Women's Health

## 2017-05-27 VITALS — BP 104/60 | HR 72 | Ht 62.0 in | Wt 152.0 lb

## 2017-05-27 DIAGNOSIS — K219 Gastro-esophageal reflux disease without esophagitis: Secondary | ICD-10-CM

## 2017-05-27 DIAGNOSIS — J301 Allergic rhinitis due to pollen: Secondary | ICD-10-CM | POA: Diagnosis not present

## 2017-05-27 DIAGNOSIS — K5904 Chronic idiopathic constipation: Secondary | ICD-10-CM | POA: Diagnosis not present

## 2017-05-27 DIAGNOSIS — Z86010 Personal history of colonic polyps: Secondary | ICD-10-CM

## 2017-05-27 DIAGNOSIS — J3089 Other allergic rhinitis: Secondary | ICD-10-CM | POA: Diagnosis not present

## 2017-05-27 DIAGNOSIS — K581 Irritable bowel syndrome with constipation: Secondary | ICD-10-CM | POA: Diagnosis not present

## 2017-05-27 MED ORDER — DEXLANSOPRAZOLE 60 MG PO CPDR: 60.0000 mg | DELAYED_RELEASE_CAPSULE | Freq: Every day | ORAL | 3 refills | Status: DC

## 2017-05-27 NOTE — Progress Notes (Signed)
Subjective:    Patient ID: [name], female    DOB: [birth year], 76 y.o.   MRN: [id number]  HPI [name] is a pleasant 76 year old white female known to Dr. Fuller Plan with history of GERD, IBS, family history of colon cancer and history of adenomatous colon polyps. She status post cholecystectomy and splenectomy. Patient comes in today to discuss worsening constipation. She has been managed with lens S 145 g daily for quite a while. She says that has helped her quite a bit generally was still having a bowel movement just every 2-3 days. About 2 weeks ago she had an episode of severe constipation and was unable to have a bowel movement for about 6 days despite taking Linzess. She started adding stool softeners and MiraLAX but became concerned because she had a little bit of stool leakage. Eventually yesterday she had a very large evacuation of stool and says she feels much better. She has not yet resumed Linzess. She also is having breakthrough reflux symptoms despite taking Protonix 40 mg by mouth twice a day. She says at least a couple times per week she has heartburn in the evenings that requires Gaviscon which usually works well. She has no complaints of dysphagia or odynophagia. She tries to follow an antireflux regimen   She does mention that her son was just diagnosed with colon cancer at age 72 and is undergoing treatment and doing well.  Patient last had colonoscopy in April 2017, this was a normal exam with no recurrent polyps, prior to that she had had a sessile serrated adenomatous polyp, and is recommended to have follow-up in 2022. Last EGD October 2014 with small hiatal hernia only.  Review of Systems Pertinent positive and negative review of systems were noted in the above HPI section.  All other review of systems was otherwise negative.  Outpatient Encounter Prescriptions as of 05/27/2017  Medication Sig  . [DISCONTINUED] insulin lispro (HUMALOG KWIKPEN) 100 UNIT/ML KiwkPen 10  units before meals  . aspirin EC 81 MG tablet Take 1 tablet (81 mg total) by mouth daily.  Marland Kitchen b complex vitamins tablet Take 1 tablet by mouth daily. Vitamin supplement  . BD PEN NEEDLE NANO U/F 32G X 4 MM MISC USE AS DIRECTED TWICE DAILY.  . busPIRone (BUSPAR) 15 MG tablet Take 1 tablet by mouth 2 (two) times daily.  . calcium-vitamin D (OSCAL WITH D) 500-200 MG-UNIT per tablet Take 1 tablet by mouth daily with breakfast.  . cetirizine (ZYRTEC) 10 MG tablet Take 10 mg by mouth daily as needed for allergies.   . cholecalciferol (VITAMIN D) 1000 units tablet Take 2,000 Units by mouth daily.  Marland Kitchen conjugated estrogens (PREMARIN) vaginal cream Place 1 Applicatorful vaginally 2 (two) times a week.  . dicyclomine (BENTYL) 10 MG capsule TAKE 1 CAPSULE (10 MG TOTAL) BY MOUTH 3 (THREE) TIMES DAILY BEFORE MEALS.  Marland Kitchen estradiol (CLIMARA - DOSED IN MG/24 HR) 0.025 mg/24hr patch Place 1 patch (0.025 mg total) onto the skin once a week. APPLY 1 PATCH ONTO SKIN ONCE WEEKLY  . fluconazole (DIFLUCAN) 100 MG tablet Take one tablet every other day for 3 days  . furosemide (LASIX) 20 MG tablet TAKE 1 TO 2 TABLETS BY MOUTH EVERY DAY (Patient taking differently: TAKE 1 TABLET BY MOUTH EVERY DAY)  . insulin aspart (NOVOLOG FLEXPEN) 100 UNIT/ML FlexPen Inject 10 units before meals (Patient not taking: Reported on 05/22/2017)  . L-Methylfolate (DEPLIN) 15 MG TABS Take 15 mg by mouth daily. For treatment  of depression  . linaclotide (LINZESS) 145 MCG CAPS capsule Take 1 capsule (145 mcg total) by mouth daily.  . Liniments (SALONPAS PAIN RELIEF PATCH EX) Apply 1 patch topically at bedtime.  Marland Kitchen LORazepam (ATIVAN) 1 MG tablet Take 1 mg by mouth 4 (four) times daily as needed for anxiety.   . Magnesium 500 MG CAPS Take 1 capsule by mouth daily.  . metFORMIN (GLUCOPHAGE-XR) 500 MG 24 hr tablet Take 2 tablets (1,000 mg total) by mouth 2 (two) times daily. For DM type 2  . Multiple Vitamins-Minerals (MULTIVITAMIN,TX-MINERALS) tablet  Take 1 tablet by mouth daily.   . ondansetron (ZOFRAN) 4 MG tablet Take 4 mg by mouth every 8 (eight) hours as needed for nausea or vomiting.  Glory Rosebush DELICA LANCETS 37S MISC USE UTD UP TO QID  . ONETOUCH VERIO test strip Use to check blood sugars 5 times daily.  . pantoprazole (PROTONIX) 40 MG tablet TAKE 1 TABLET BY MOUTH TWICE A DAY  . pioglitazone (ACTOS) 45 MG tablet Take 1 tablet (45 mg total) by mouth daily.  . SUMAtriptan (IMITREX) 100 MG tablet Take 1 tablet (100 mg total) by mouth once as needed for migraine. May repeat in 2 hours if headache persists or recurs.  . Testosterone POWD APPLY A TINY PEA-SIZED AMOUNT ONCE A DAY.  . traMADol-acetaminophen (ULTRACET) 37.5-325 MG tablet Take 1 tablet by mouth every 8 (eight) hours as needed.  . TRINTELLIX 20 MG TABS Take 20 mg by mouth daily.  Marland Kitchen VICTOZA 18 MG/3ML SOPN INJECT 1.2MG  INTO MUSCLE DAILY  . VOLTAREN 1 % GEL APPLY 2 GRAMS TO KNEE 2-3 TIMES DAILY PRN  . [DISCONTINUED] lovastatin (MEVACOR) 20 MG tablet TAKE 1 TABLET BY MOUTH EVERY DAY **NEED OV** (Patient taking differently: 1 TAB EVERY OTHER DAY)   No facility-administered encounter medications on file as of 05/27/2017.    Allergies  Allergen Reactions  . Amlodipine Other (See Comments)    Dizzy, headaches  . Celebrex [Celecoxib] Other (See Comments)    headaches  . Topamax [Topiramate] Other (See Comments)    hallucinations  . Codeine Nausea Only  . Gabapentin Other (See Comments)    dizzy  . Hydrocodone Nausea Only and Other (See Comments)    Feeling funny,   . Meloxicam Other (See Comments)    jittery  . Pravastatin Sodium Other (See Comments)    Muscle aches and pains   Patient Active Problem List   Diagnosis Date Noted  . Diabetic peripheral neuropathy associated with type 2 diabetes mellitus ([place]) 04/10/2017  . Nausea 03/10/2017  . IDA (iron deficiency anemia) 10/16/2016  . Anemia 09/04/2016  . Intractable chronic migraine without aura and with status  migrainosus 08/12/2016  . CAP (community acquired pneumonia) 05/28/2016  . Acute confusional state 05/16/2016  . Chronic migraine w/o aura w/o status migrainosus, not intractable 04/13/2016  . OSA on CPAP 01/30/2016  . Acute URI 10/17/2015  . Rash 07/12/2015  . SI (sacroiliac) pain 03/30/2015  . Sciatica of right side 08/08/2014  . Acute blood loss anemia 03/09/2014  . DJD (degenerative joint disease) of knee 11/10/2013  . Constipation 10/29/2013  . Obesity 08/04/2013  . Osteoarthritis of right knee 08/04/2013  . Edema 05/11/2013  . Fall 09/16/2012  . Neck pain 09/16/2012  . Low back pain 09/16/2012  . Well adult exam 07/01/2012  . Cerumen impaction 08/07/2011  . Acute bronchitis 05/21/2011  . Fatigue 08/07/2010  . PARESTHESIA 08/07/2010  . DYSPHAGIA 07/13/2009  . Irritable bowel syndrome  09/24/2007  . LIVER FUNCTION TESTS, ABNORMAL 09/24/2007  . DM type 2, controlled, with complication ([place]) 84/16/6063  . Anxiety state 06/26/2007  . Depression 06/26/2007  . 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 heart failure 06/26/2007  . GERD 06/26/2007  . OSTEOARTHRITIS 06/26/2007  . COLONIC POLYPS, HX OF 06/26/2007  . 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 Marital status: Married    Spouse name: Patrick Jupiter  . Number of children: 3  . Years of education: 47   Occupational History  . Retired Retired   Social History Main Topics  . Smoking status: Never Smoker  . Smokeless tobacco: Never Used  . Alcohol use No  . Drug use: No  . Sexual activity: Yes    Birth control/ protection: Surgical, Post-menopausal     Comment: HYST, INTERCOURSE AGE 78, SEXUAL PARTNERS LESS THAN 5   Other Topics Concern  . Not on file   Social History Narrative   Daily Caffeine Use:  Rarely   Regular [facility] -  YES [facility] [employer]   Married with 3 sons          Ms. Steinborn's family history includes Breast cancer in her other; Breast cancer (age of  onset: 45) in her sister; Colon cancer (age of onset: 13) in her mother; Colon polyps in her mother; Diabetes in her father.      Objective:    Vitals:   05/27/17 1115  BP: 104/60  Pulse: 72    Physical Exam  well-developed older white female in no acute distress, pleasant blood pressure 104/60, pulse 72, BMI 27.8. HEENT; nontraumatic normocephalic EOMI PERRLA sclera anicteric, Cardiovascular; regular rate and rhythm with S1-S2 no murmur rub or gallop, Pulmonary; clear bilaterally, Abdomen; soft, nontender nondistended bowel sounds are active there is no palpable mass or hepatosplenomegaly, Rectal; exam not done, Neuropsych; mood and affect appropriate       Assessment & Plan:   #29 76 year old white female with IBS C with recent episode of severe constipation refractory to Linzess 145 g daily #2 personal history of sessile serrated adenomatous polyp-up-to-date with colonoscopy last done in April 2017 and normal plan follow-up April 2022 #3 chronic GERD-intermittent breakthrough symptoms in the evenings despite twice a day Protonix #4 family history of colon cancer #5 hypertension #6 obstructive sleep apnea #7 coronary artery disease #8 adult-onset diabetes mellitus  Plan; patient will tighten anti-reflux regimen Switched to Dexilant  60 mg by mouth every morning Continue Linzess 145 g by mouth daily, have given her samples of 72 g capsules and have asked her to take one of each each morning to see if this combination will be tolerated and improve her constipation. If this is helpful will send prescription. (  patient tried 290 g in the past which caused diarrhea.) She will call back in about 10 days with progress report and can alter regimen as needed at that time. She'll follow up with Dr. Fuller Plan or myself on an as-needed basis.  Amy S Esterwood PA-C 05/27/2017   Cc: Plotnikov, Evie Lacks, MD

## 2017-05-27 NOTE — Progress Notes (Signed)
Reviewed and agree with initial management plan.  Sammy Douthitt T. Ilene Witcher, MD FACG 

## 2017-05-27 NOTE — Patient Instructions (Signed)
We have sent the following medications to your pharmacy for you to pick up at your convenience: [facility] [place], [place].  1. Dexilant 60 mg.   Take Linzess 145 mcg and also take a 72 mcg capsule with it daily. If this is too much, and stools get too soft or runny, You can take the 72 mcg capsule every other day.  Call us back in a week and let us know how you are doing . Or if you have any questions about the dosage and your stool consistecy.  Let us know if you need refills.

## 2017-05-29 ENCOUNTER — Other Ambulatory Visit: Payer: Self-pay | Admitting: Gastroenterology

## 2017-05-29 DIAGNOSIS — K219 Gastro-esophageal reflux disease without esophagitis: Secondary | ICD-10-CM

## 2017-06-02 ENCOUNTER — Other Ambulatory Visit: Payer: Self-pay

## 2017-06-02 MED ORDER — VICTOZA 18 MG/3ML ~~LOC~~ SOPN: PEN_INJECTOR | SUBCUTANEOUS | 2 refills | Status: DC

## 2017-06-11 ENCOUNTER — Ambulatory Visit (HOSPITAL_BASED_OUTPATIENT_CLINIC_OR_DEPARTMENT_OTHER): Payer: PPO | Admitting: Family

## 2017-06-11 ENCOUNTER — Other Ambulatory Visit (HOSPITAL_BASED_OUTPATIENT_CLINIC_OR_DEPARTMENT_OTHER): Payer: PPO

## 2017-06-11 VITALS — BP 129/59 | HR 87 | Temp 97.9°F | Resp 16 | Wt 149.0 lb

## 2017-06-11 DIAGNOSIS — D508 Other iron deficiency anemias: Secondary | ICD-10-CM

## 2017-06-11 DIAGNOSIS — K909 Intestinal malabsorption, unspecified: Secondary | ICD-10-CM

## 2017-06-11 DIAGNOSIS — D500 Iron deficiency anemia secondary to blood loss (chronic): Secondary | ICD-10-CM

## 2017-06-11 DIAGNOSIS — E559 Vitamin D deficiency, unspecified: Secondary | ICD-10-CM | POA: Diagnosis not present

## 2017-06-11 DIAGNOSIS — N183 Chronic kidney disease, stage 3 (moderate): Secondary | ICD-10-CM

## 2017-06-11 LAB — CBC WITH DIFFERENTIAL (CANCER CENTER ONLY)
BASO#: 0.2 10*3/uL (ref 0.0–0.2)
BASO%: 2.1 % — AB (ref 0.0–2.0)
EOS ABS: 0.5 10*3/uL (ref 0.0–0.5)
EOS%: 7.5 % — ABNORMAL HIGH (ref 0.0–7.0)
HCT: 33.5 % — ABNORMAL LOW (ref 34.8–46.6)
HEMOGLOBIN: 11.4 g/dL — AB (ref 11.6–15.9)
LYMPH#: 2.8 10*3/uL (ref 0.9–3.3)
LYMPH%: 38.4 % (ref 14.0–48.0)
MCH: 33.4 pg (ref 26.0–34.0)
MCHC: 34 g/dL (ref 32.0–36.0)
MCV: 98 fL (ref 81–101)
MONO#: 0.7 10*3/uL (ref 0.1–0.9)
MONO%: 9.5 % (ref 0.0–13.0)
NEUT%: 42.5 % (ref 39.6–80.0)
NEUTROS ABS: 3.1 10*3/uL (ref 1.5–6.5)
PLATELETS: 424 10*3/uL — AB (ref 145–400)
RBC: 3.41 10*6/uL — AB (ref 3.70–5.32)
RDW: 13.9 % (ref 11.1–15.7)
WBC: 7.2 10*3/uL (ref 3.9–10.0)

## 2017-06-11 NOTE — Progress Notes (Signed)
Hematology and Oncology Follow Up Visit  [name] [id number] [birth year] 76 y.o. 06/11/2017   Principle Diagnosis:  1. Iron deficiency anemia  2. 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 She is feeling more and more fatigued. She is not sleeping well. Her CPAP wakes her up multiple times during the night.  No fever, chills, n/v, cough, rash, dizziness, SOB, chest pain, palpitations, abdominal pain or changes in bowel or bladder habits.  She takes linzess for occasional constipation.  Her chronic back pain is unchanged.  No tenderness in her extremities. The numbness and tingling in her feet is unchanged. She has occasional puffiness in her feet and ankles that comes and goes. This impoves when she takes her lasix.  She has maintained a good appetite and is staying well hydrated. Her blood sugars have been better. Her weight is stable.   ECOG Performance Status: 1 - Symptomatic but completely ambulatory  Medications:  Allergies as of 06/11/2017      Reactions   Amlodipine Other (See Comments)   Dizzy, headaches   Celebrex [celecoxib] Other (See Comments)   headaches   Topamax [topiramate] Other (See Comments)   hallucinations   Codeine Nausea Only   Gabapentin Other (See Comments)   dizzy   Hydrocodone Nausea Only, Other (See Comments)   Feeling funny,    Meloxicam Other (See Comments)   jittery   Pravastatin Sodium Other (See Comments)   Muscle aches and pains      Medication List       Accurate as of 06/11/17  4:40 PM. Always use your most recent med list.          alendronate 70 MG tablet Commonly known as:  FOSAMAX TAKE 1 TABLET BY MOUTH ONCE WEEKLY, TAKE WITH FULL GLASS OF WATER ON EMPTY STOMACH   aspirin EC 81 MG tablet Take 1 tablet (81 mg total) by mouth daily.   b complex vitamins tablet Take 1 tablet by mouth daily.  Vitamin supplement   BD PEN NEEDLE NANO U/F 32G X 4 MM Misc Generic drug:  Insulin Pen Needle USE AS DIRECTED TWICE DAILY.   busPIRone 15 MG tablet Commonly known as:  BUSPAR Take 1 tablet by mouth 2 (two) times daily.   calcium-vitamin D 500-200 MG-UNIT tablet Commonly known as:  OSCAL WITH D Take 1 tablet by mouth daily with breakfast.   cetirizine 10 MG tablet Commonly known as:  ZYRTEC Take 10 mg by mouth daily as needed for allergies.   cholecalciferol 1000 units tablet Commonly known as:  VITAMIN D Take 2,000 Units by mouth daily.   conjugated estrogens vaginal cream Commonly known as:  PREMARIN Place 1 Applicatorful vaginally 2 (two) times a week.   DEPLIN 15 MG Tabs Take 15 mg by mouth daily. For treatment of depression   dexlansoprazole 60 MG capsule Commonly known as:  DEXILANT Take 1 capsule (60 mg total) by mouth daily.   dicyclomine 10 MG capsule Commonly known as:  BENTYL TAKE 1 CAPSULE (10 MG TOTAL) BY MOUTH 3 (THREE) TIMES DAILY BEFORE MEALS.   estradiol 0.025 mg/24hr patch Commonly known as:  CLIMARA - Dosed in mg/24 hr Place 1 patch (0.025 mg total) onto the skin once a week. APPLY 1 PATCH ONTO SKIN ONCE WEEKLY   fluconazole 100 MG tablet Commonly known as:  DIFLUCAN Take one tablet every other day for 3 days   furosemide 20 MG tablet Commonly known as:  LASIX TAKE 1 TO 2 TABLETS BY MOUTH EVERY DAY   HUMALOG KWIKPEN 100 UNIT/ML KiwkPen Generic drug:  insulin lispro Inject into the skin. 4-5 units before dinner   insulin aspart 100 UNIT/ML FlexPen Commonly known as:  NOVOLOG FLEXPEN Inject 10 units before meals   linaclotide 145 MCG Caps capsule Commonly known as:  LINZESS Take 1 capsule (145 mcg total) by mouth daily.   LORazepam 1 MG tablet Commonly known as:  ATIVAN Take 1 mg by mouth 4 (four) times daily as needed for anxiety.   lovastatin 20 MG tablet Commonly known as:  MEVACOR Take 20 mg by mouth every other day.   Magnesium  500 MG Caps Take 1 capsule by mouth daily.   metFORMIN 500 MG 24 hr tablet Commonly known as:  GLUCOPHAGE-XR Take 2 tablets (1,000 mg total) by mouth 2 (two) times daily. For DM type 2   multivitamin,tx-minerals tablet Take 1 tablet by mouth daily.   ondansetron 4 MG tablet Commonly known as:  ZOFRAN Take 4 mg by mouth every 8 (eight) hours as needed for nausea or vomiting.   ONETOUCH DELICA LANCETS 19Q Misc USE UTD UP TO QID   ONETOUCH VERIO test strip Generic drug:  glucose blood Use to check blood sugars 5 times daily.   pantoprazole 40 MG tablet Commonly known as:  PROTONIX TAKE 1 TABLET BY MOUTH TWICE A DAY   pioglitazone 45 MG tablet Commonly known as:  ACTOS Take 1 tablet (45 mg total) by mouth daily.   repaglinide 1 MG tablet Commonly known as:  PRANDIN TAKE 1-2 TABLETS BY MOUTH JUST BEFORE DINNER   SALONPAS PAIN RELIEF PATCH EX Apply 1 patch topically at bedtime.   SUMAtriptan 100 MG tablet Commonly known as:  IMITREX Take 1 tablet (100 mg total) by mouth once as needed for migraine. May repeat in 2 hours if headache persists or recurs.   Testosterone Powd APPLY A TINY PEA-SIZED AMOUNT ONCE A DAY.   traMADol-acetaminophen 37.5-325 MG tablet Commonly known as:  ULTRACET Take 1 tablet by mouth every 8 (eight) hours as needed.   TRINTELLIX 20 MG Tabs Generic drug:  vortioxetine HBr Take 20 mg by mouth daily.   VICTOZA 18 MG/3ML Sopn Generic drug:  liraglutide INJECT 1.2MG  INTO MUSCLE DAILY   VOLTAREN 1 % Gel Generic drug:  diclofenac sodium APPLY 2 GRAMS TO KNEE 2-3 TIMES DAILY PRN       Allergies:  Allergies  Allergen Reactions  . Amlodipine Other (See Comments)    Dizzy, headaches  . Celebrex [Celecoxib] Other (See Comments)    headaches  . Topamax [Topiramate] Other (See Comments)    hallucinations  . Codeine Nausea Only  . Gabapentin Other (See Comments)    dizzy  . Hydrocodone Nausea Only and Other (See Comments)    Feeling funny,    . Meloxicam Other (See Comments)    jittery  . Pravastatin Sodium Other (See Comments)    Muscle aches and pains    Past Medical History, Surgical history, Social history, and Family History were reviewed and updated.  Review of Systems: All other 10 point review of systems is negative.   Physical Exam:  weight is 149 lb (67.6 kg). Her oral temperature is 97.9 F (36.6 C). Her blood pressure is 129/59 (abnormal) and her pulse is 87. Her respiration is 16 and oxygen saturation is  98%.   Wt Readings from Last 3 Encounters:  06/11/17 149 lb (67.6 kg)  05/27/17 152 lb (68.9 kg)  05/22/17 150 lb (68 kg)    Ocular: Sclerae unicteric, pupils equal, round and reactive to light Ear-nose-throat: Oropharynx clear, dentition fair Lymphatic: No cervical, supraclavicular or axillary adenopathy Lungs no rales or rhonchi, good excursion bilaterally Heart regular rate and rhythm, no murmur appreciated Abd soft, nontender, positive bowel sounds, no liver or spleen tip palpated on exam, no fluid wave MSK no focal spinal tenderness, no joint edema Neuro: non-focal, well-oriented, appropriate affect Breasts: Deferred   Lab Results  Component Value Date   WBC 7.2 06/11/2017   HGB 11.4 (L) 06/11/2017   HCT 33.5 (L) 06/11/2017   MCV 98 06/11/2017   PLT 424 (H) 06/11/2017   Lab Results  Component Value Date   FERRITIN 92 03/14/2017   IRON 93 03/14/2017   TIBC 290 03/14/2017   UIBC 197 03/14/2017   IRONPCTSAT 32 03/14/2017   Lab Results  Component Value Date   RETICCTPCT 1.7 02/07/2006   RBC 3.41 (L) 06/11/2017   RETICCTABS 68.2 02/07/2006   No results found for: KPAFRELGTCHN, LAMBDASER, KAPLAMBRATIO No results found for: IGGSERUM, IGA, IGMSERUM No results found for: Kathrynn Ducking, MSPIKE, SPEI   Chemistry      Component Value Date/Time   NA 134 (L) 03/10/2017 1507   NA 134 (L) 10/11/2016 1039   K 4.2 03/10/2017 1507   K 4.4  10/11/2016 1039   CL 96 03/10/2017 1507   CO2 31 03/10/2017 1507   CO2 22 10/11/2016 1039   BUN 15 03/10/2017 1507   BUN 17.0 10/11/2016 1039   CREATININE 1.02 03/10/2017 1507   CREATININE 1.2 (H) 10/11/2016 1039      Component Value Date/Time   CALCIUM 10.0 03/10/2017 1507   CALCIUM 9.7 10/11/2016 1039   ALKPHOS 56 03/10/2017 1507   ALKPHOS 69 10/11/2016 1039   AST 20 03/10/2017 1507   AST 22 10/11/2016 1039   ALT 14 03/10/2017 1507   ALT 17 10/11/2016 1039   BILITOT 0.3 03/10/2017 1507   BILITOT 0.22 10/11/2016 1039      Impression and Plan: Ms. Gunner is a very pleasant 76 yo caucasian female with iron deficiency anemia secondary to malabsorption. She is feeling fatigued and has not been sleeping well. Hgb is 11.4 with an MCV of 98.  Iron studies and vitamin D are stable.  We will go ahead and plan to see her back again in 2 months.  Greater than 50% of her 15 minute face to face visit was spent counseling and coordinating care.  She will contact our office with any questions or concerns. We can certainly see her sooner if need be.   Eliezer Bottom, NP 10/17/20184:40 PM

## 2017-06-12 LAB — IRON AND TIBC
%SAT: 25 % (ref 21–57)
IRON: 81 ug/dL (ref 41–142)
TIBC: 318 ug/dL (ref 236–444)
UIBC: 237 ug/dL (ref 120–384)

## 2017-06-12 LAB — FERRITIN: Ferritin: 88 ng/ml (ref 9–269)

## 2017-06-12 LAB — VITAMIN D 25 HYDROXY (VIT D DEFICIENCY, FRACTURES): VIT D 25 HYDROXY: 53.1 ng/mL (ref 30.0–100.0)

## 2017-06-17 ENCOUNTER — Ambulatory Visit: Payer: PPO | Admitting: Internal Medicine

## 2017-06-18 ENCOUNTER — Other Ambulatory Visit: Payer: Self-pay

## 2017-06-18 MED ORDER — INSULIN LISPRO 100 UNIT/ML (KWIKPEN): PEN_INJECTOR | SUBCUTANEOUS | 2 refills | Status: DC

## 2017-06-23 ENCOUNTER — Other Ambulatory Visit: Payer: Self-pay

## 2017-06-23 MED ORDER — INSULIN LISPRO 100 UNIT/ML (KWIKPEN): PEN_INJECTOR | SUBCUTANEOUS | 2 refills | Status: DC

## 2017-06-24 ENCOUNTER — Ambulatory Visit (INDEPENDENT_AMBULATORY_CARE_PROVIDER_SITE_OTHER): Payer: PPO | Admitting: Internal Medicine

## 2017-06-24 ENCOUNTER — Other Ambulatory Visit (INDEPENDENT_AMBULATORY_CARE_PROVIDER_SITE_OTHER): Payer: PPO

## 2017-06-24 ENCOUNTER — Telehealth: Payer: Self-pay | Admitting: Physician Assistant

## 2017-06-24 ENCOUNTER — Encounter: Payer: Self-pay | Admitting: Internal Medicine

## 2017-06-24 VITALS — BP 140/86 | HR 104 | Temp 98.3°F | Ht 62.0 in | Wt 151.0 lb

## 2017-06-24 DIAGNOSIS — E118 Type 2 diabetes mellitus with unspecified complications: Secondary | ICD-10-CM

## 2017-06-24 DIAGNOSIS — G8929 Other chronic pain: Secondary | ICD-10-CM

## 2017-06-24 DIAGNOSIS — Z23 Encounter for immunization: Secondary | ICD-10-CM | POA: Diagnosis not present

## 2017-06-24 DIAGNOSIS — J069 Acute upper respiratory infection, unspecified: Secondary | ICD-10-CM

## 2017-06-24 DIAGNOSIS — M5440 Lumbago with sciatica, unspecified side: Secondary | ICD-10-CM | POA: Diagnosis not present

## 2017-06-24 DIAGNOSIS — I119 Hypertensive heart disease without heart failure: Secondary | ICD-10-CM

## 2017-06-24 LAB — BASIC METABOLIC PANEL
BUN: 14 mg/dL (ref 6–23)
CALCIUM: 9.9 mg/dL (ref 8.4–10.5)
CO2: 30 mEq/L (ref 19–32)
CREATININE: 0.89 mg/dL (ref 0.40–1.20)
Chloride: 99 mEq/L (ref 96–112)
GFR: 65.57 mL/min (ref >60.00)
Glucose, Bld: 177 mg/dL — ABNORMAL HIGH (ref 70–99)
Potassium: 4.2 mEq/L (ref 3.5–5.1)
Sodium: 135 mEq/L (ref 135–145)

## 2017-06-24 LAB — HEMOGLOBIN A1C: HEMOGLOBIN A1C: 6.6 % — AB (ref 4.6–6.5)

## 2017-06-24 MED ORDER — ZOSTER VAC RECOMB ADJUVANTED 50 MCG/0.5ML IM SUSR: 0.5000 mL | Freq: Once | INTRAMUSCULAR | 1 refills | Status: AC

## 2017-06-24 MED ORDER — AZITHROMYCIN 250 MG PO TABS: ORAL_TABLET | ORAL | 0 refills | Status: DC

## 2017-06-24 MED ORDER — FUROSEMIDE 20 MG PO TABS: 20.0000 mg | ORAL_TABLET | Freq: Every day | ORAL | 5 refills | Status: DC

## 2017-06-24 NOTE — Addendum Note (Signed)
Addended by: Karren Cobble on: 06/24/2017 02:20 PM   Modules accepted: Orders

## 2017-06-24 NOTE — Assessment & Plan Note (Signed)
Ultracet prn  Potential benefits of a long term Tramadol/opioids use as well as potential risks (i.e. addiction risk, apnea etc) and complications (i.e. Somnolence, constipation and others) were explained to the patient and were aknowledged. She is contemplating a pain/nerve stimulator

## 2017-06-24 NOTE — Progress Notes (Signed)
Subjective:  Patient ID: [name], female    DOB: 1940/09/24  Age: 76 y.o. MRN: [id number]  CC: No chief complaint on file.   HPI [name] presents for ST, congestion, chills since Friday F/u DM, HTN, CRI f/u - stable  Outpatient Medications Prior to Visit  Medication Sig Dispense Refill  . aspirin EC 81 MG tablet Take 1 tablet (81 mg total) by mouth daily. 100 tablet 3  . b complex vitamins tablet Take 1 tablet by mouth daily. Vitamin supplement    . BD PEN NEEDLE NANO U/F 32G X 4 MM MISC USE AS DIRECTED TWICE DAILY. 200 each 3  . busPIRone (BUSPAR) 15 MG tablet Take 1 tablet by mouth 2 (two) times daily.    . calcium-vitamin D (OSCAL WITH D) 500-200 MG-UNIT per tablet Take 1 tablet by mouth daily with breakfast.    . cetirizine (ZYRTEC) 10 MG tablet Take 10 mg by mouth daily as needed for allergies.     . cholecalciferol (VITAMIN D) 1000 units tablet Take 2,000 Units by mouth daily.    Marland Kitchen conjugated estrogens (PREMARIN) vaginal cream Place 1 Applicatorful vaginally 2 (two) times a week. 42.5 g 4  . dexlansoprazole (DEXILANT) 60 MG capsule Take 1 capsule (60 mg total) by mouth daily. 90 capsule 3  . dicyclomine (BENTYL) 10 MG capsule TAKE 1 CAPSULE (10 MG TOTAL) BY MOUTH 3 (THREE) TIMES DAILY BEFORE MEALS. 270 capsule 1  . estradiol (CLIMARA - DOSED IN MG/24 HR) 0.025 mg/24hr patch Place 1 patch (0.025 mg total) onto the skin once a week. APPLY 1 PATCH ONTO SKIN ONCE WEEKLY 12 patch 4  . fluconazole (DIFLUCAN) 100 MG tablet Take one tablet every other day for 3 days 3 tablet 1  . furosemide (LASIX) 20 MG tablet TAKE 1 TO 2 TABLETS BY MOUTH EVERY DAY (Patient taking differently: TAKE 1 TABLET BY MOUTH EVERY DAY) 60 tablet 5  . insulin lispro (HUMALOG KWIKPEN) 100 UNIT/ML KiwkPen 4-5 units before dinner 9 mL 2  . L-Methylfolate (DEPLIN) 15 MG TABS Take 15 mg by mouth daily. For treatment of depression    . linaclotide (LINZESS) 145 MCG CAPS capsule Take 1 capsule (145 mcg  total) by mouth daily. (Patient taking differently: Take 72 mcg by mouth daily. ) 30 capsule 11  . Liniments (SALONPAS PAIN RELIEF PATCH EX) Apply 1 patch topically at bedtime.    Marland Kitchen LORazepam (ATIVAN) 1 MG tablet Take 1 mg by mouth 4 (four) times daily as needed for anxiety.   2  . lovastatin (MEVACOR) 20 MG tablet Take 20 mg by mouth every other day.    . Magnesium 500 MG CAPS Take 1 capsule by mouth daily.    . metFORMIN (GLUCOPHAGE-XR) 500 MG 24 hr tablet Take 2 tablets (1,000 mg total) by mouth 2 (two) times daily. For DM type 2 360 tablet 3  . Multiple Vitamins-Minerals (MULTIVITAMIN,TX-MINERALS) tablet Take 1 tablet by mouth daily.     . ondansetron (ZOFRAN) 4 MG tablet Take 4 mg by mouth every 8 (eight) hours as needed for nausea or vomiting.    Glory Rosebush DELICA LANCETS 12W MISC USE UTD UP TO QID 200 each 3  . ONETOUCH VERIO test strip Use to check blood sugars 5 times daily. 450 each 3  . pioglitazone (ACTOS) 45 MG tablet Take 1 tablet (45 mg total) by mouth daily. 90 tablet 3  . SUMAtriptan (IMITREX) 100 MG tablet Take 1 tablet (100 mg total) by  mouth once as needed for migraine. May repeat in 2 hours if headache persists or recurs. 10 tablet 11  . Testosterone POWD APPLY A TINY PEA-SIZED AMOUNT ONCE A DAY. (Patient taking differently: APPLY A TINY PEA-SIZED AMOUNT ONCE A DAY.  4 percert) 30 g 5  . traMADol-acetaminophen (ULTRACET) 37.5-325 MG tablet Take 1 tablet by mouth every 8 (eight) hours as needed. (Patient taking differently: Take 1 tablet by mouth every 8 (eight) hours as needed. ) 90 tablet 3  . TRINTELLIX 20 MG TABS Take 20 mg by mouth daily.  2  . VICTOZA 18 MG/3ML SOPN INJECT 1.2MG  INTO MUSCLE DAILY 12 pen 2  . VOLTAREN 1 % GEL APPLY 2 GRAMS TO KNEE 2-3 TIMES DAILY PRN  1  . alendronate (FOSAMAX) 70 MG tablet TAKE 1 TABLET BY MOUTH ONCE WEEKLY, TAKE WITH FULL GLASS OF WATER ON EMPTY STOMACH  4  . insulin aspart (NOVOLOG FLEXPEN) 100 UNIT/ML FlexPen Inject 10 units before  meals (Patient not taking: Reported on 06/24/2017) 15 mL 3  . pantoprazole (PROTONIX) 40 MG tablet TAKE 1 TABLET BY MOUTH TWICE A DAY (Patient not taking: Reported on 06/24/2017) 180 tablet 1  . repaglinide (PRANDIN) 1 MG tablet TAKE 1-2 TABLETS BY MOUTH JUST BEFORE DINNER  3   No facility-administered medications prior to visit.     ROS Review of Systems  Constitutional: Positive for chills and fatigue. Negative for activity change, appetite change and unexpected weight change.  HENT: Positive for congestion, sinus pain and sore throat. Negative for mouth sores and sinus pressure.   Eyes: Negative for visual disturbance.  Respiratory: Negative for cough and chest tightness.   Gastrointestinal: Negative for abdominal pain and nausea.  Genitourinary: Negative for difficulty urinating, frequency and vaginal pain.  Musculoskeletal: Positive for arthralgias and gait problem. Negative for back pain.  Skin: Negative for pallor and rash.  Neurological: Positive for weakness. Negative for dizziness, tremors, numbness and headaches.  Psychiatric/Behavioral: Positive for decreased concentration, dysphoric mood and sleep disturbance. Negative for confusion and suicidal ideas. The patient is nervous/anxious.     Objective:  BP 140/86 (BP Location: Left Arm, Patient Position: Sitting, Cuff Size: Normal)   Pulse (!) 104   Temp 98.3 F (36.8 C) (Oral)   Ht 5\' 2"  (1.575 m)   Wt 151 lb (68.5 kg)   SpO2 98%   BMI 27.62 kg/m   BP Readings from Last 3 Encounters:  06/24/17 140/86  06/11/17 (!) 129/59  05/27/17 104/60    Wt Readings from Last 3 Encounters:  06/24/17 151 lb (68.5 kg)  06/11/17 149 lb (67.6 kg)  05/27/17 152 lb (68.9 kg)    Physical Exam  Constitutional: She appears well-developed. No distress.  HENT:  Head: Normocephalic.  Right Ear: External ear normal.  Left Ear: External ear normal.  Nose: Nose normal.  Mouth/Throat: Oropharynx is clear and moist.  Eyes: Pupils are  equal, round, and reactive to light. Conjunctivae are normal. Right eye exhibits no discharge. Left eye exhibits no discharge.  Neck: Normal range of motion. Neck supple. No JVD present. No tracheal deviation present. No thyromegaly present.  Cardiovascular: Normal rate, regular rhythm and normal heart sounds.   Pulmonary/Chest: No stridor. No respiratory distress. She has no wheezes.  Abdominal: Soft. Bowel sounds are normal. She exhibits no distension and no mass. There is no tenderness. There is no rebound and no guarding.  Musculoskeletal: She exhibits no edema or tenderness.  Lymphadenopathy:    She has no cervical  adenopathy.  Neurological: She displays normal reflexes. No cranial nerve deficit. She exhibits normal muscle tone. Coordination normal.  Skin: No rash noted. No erythema.  Psychiatric: She has a normal mood and affect. Her behavior is normal. Judgment and thought content normal.  LS tender eryth nasal mucosa   Lab Results  Component Value Date   WBC 7.2 06/11/2017   HGB 11.4 (L) 06/11/2017   HCT 33.5 (L) 06/11/2017   PLT 424 (H) 06/11/2017   GLUCOSE 146 (H) 03/10/2017   CHOL 119 05/10/2014   TRIG 77.0 05/10/2014   HDL 54.30 05/10/2014   LDLCALC 49 05/10/2014   ALT 14 03/10/2017   AST 20 03/10/2017   NA 134 (L) 03/10/2017   K 4.2 03/10/2017   CL 96 03/10/2017   CREATININE 1.02 03/10/2017   BUN 15 03/10/2017   CO2 31 03/10/2017   TSH 1.26 08/07/2010   INR 0.93 11/02/2013   HGBA1C 6.8 (H) 03/10/2017   MICROALBUR 0.2 11/07/2006    Mm Screening Breast Tomo Bilateral  Result Date: 05/27/2017 CLINICAL DATA:  Screening. EXAM: 2D DIGITAL SCREENING BILATERAL MAMMOGRAM WITH CAD AND ADJUNCT TOMO COMPARISON:  Previous exam(s). ACR Breast Density Category b: There are scattered areas of fibroglandular density. FINDINGS: There are no findings suspicious for malignancy. Images were processed with CAD. IMPRESSION: No mammographic evidence of malignancy. A result letter of  this screening mammogram will be mailed directly to the patient. RECOMMENDATION: Screening mammogram in one year. (Code:SM-B-01Y) BI-RADS CATEGORY  1: Negative. Electronically Signed   By: Kristopher Oppenheim M.D.   On: 05/27/2017 12:30    Assessment & Plan:   There are no diagnoses linked to this encounter. I have discontinued Ms. Beckles's pantoprazole, insulin aspart, and repaglinide. I am also having her maintain her b complex vitamins, (multivitamin,tx-minerals), cetirizine, DEPLIN, calcium-vitamin D, aspirin EC, VOLTAREN, LORazepam, furosemide, busPIRone, Liniments (SALONPAS PAIN RELIEF PATCH EX), dicyclomine, TRINTELLIX, Magnesium, linaclotide, SUMAtriptan, ondansetron, traMADol-acetaminophen, estradiol, conjugated estrogens, Testosterone, fluconazole, pioglitazone, metFORMIN, ONETOUCH VERIO, ONETOUCH DELICA LANCETS 11H, BD PEN NEEDLE NANO U/F, cholecalciferol, lovastatin, dexlansoprazole, VICTOZA, alendronate, and insulin lispro.  No orders of the defined types were placed in this encounter.    Follow-up: No Follow-up on file.  Walker Kehr, MD

## 2017-06-24 NOTE — Assessment & Plan Note (Addendum)
10/18 Zpac

## 2017-06-24 NOTE — Telephone Encounter (Signed)
Patient is taking Linzess 72 mcg with 145 mcg daily. She is having bowel movements every 3 days on average. She reports they are formed stool and difficult to evacuate. Linzess 290 mcg caused diarrhea. What would be her next step?

## 2017-06-24 NOTE — Assessment & Plan Note (Signed)
Furosemide.

## 2017-06-24 NOTE — Assessment & Plan Note (Signed)
Victoza, Metformin, Lantus, Actos

## 2017-06-25 NOTE — Telephone Encounter (Signed)
Patient calling back in regard of this.

## 2017-06-25 NOTE — Telephone Encounter (Signed)
Lets have her take Linzess  130mcg  every day

## 2017-06-25 NOTE — Telephone Encounter (Signed)
She isn't alternating the dosages but rather taking 145 and 72 mcg together. She is taking 217 mcg daily. But she complains the 290 mcg of Linzess caused diarrhea. And she presently isn't having daily bowel movements with 217 mcg.

## 2017-06-26 ENCOUNTER — Other Ambulatory Visit: Payer: Self-pay

## 2017-06-26 MED ORDER — INSULIN ASPART 100 UNIT/ML FLEXPEN: PEN_INJECTOR | SUBCUTANEOUS | 2 refills | Status: DC

## 2017-06-26 MED ORDER — LINACLOTIDE 72 MCG PO CAPS: 72.0000 ug | ORAL_CAPSULE | Freq: Every day | ORAL | 6 refills | Status: DC

## 2017-06-26 NOTE — Telephone Encounter (Signed)
Please call back @ [phone] She will need a refill for Linzess 71mg 

## 2017-06-26 NOTE — Telephone Encounter (Signed)
Not sure we are going to be able to achieve a daily balance .If these abnormal clinical findings persist, appropriate workup will be completed. The patient understands that follow up is required to elucidate the situation. If she  Feels fine  It is ok to have BM q 3 days - since she is tolerating the 217  dose  - may want to take 217 mg one day and try 290 every other day - she may be more tolerant now... OK to refill both doses x 6 months

## 2017-06-26 NOTE — Telephone Encounter (Signed)
Discussed this with the patient. [name] has decided she will continue with her current therapy of Linzess 72 mcg with Linzess 145 mcg daily.  Refill of Linzess 72 mcg daily will be transmitted to [facility] on [address].

## 2017-06-27 ENCOUNTER — Ambulatory Visit (INDEPENDENT_AMBULATORY_CARE_PROVIDER_SITE_OTHER): Payer: PPO | Admitting: Endocrinology

## 2017-06-27 ENCOUNTER — Encounter: Payer: Self-pay | Admitting: Endocrinology

## 2017-06-27 VITALS — BP 122/68 | HR 105 | Ht 62.0 in | Wt 151.6 lb

## 2017-06-27 DIAGNOSIS — E1165 Type 2 diabetes mellitus with hyperglycemia: Secondary | ICD-10-CM

## 2017-06-27 DIAGNOSIS — Z794 Long term (current) use of insulin: Secondary | ICD-10-CM

## 2017-06-27 NOTE — Patient Instructions (Signed)
Adjust Novolog if eating more carbs and large meals  Check blood sugars on waking up  weekly  Also check blood sugars about 2 hours after a meal and do this after different meals by rotation  Recommended blood sugar levels on waking up is 90-130 and about 2 hours after meal is 130-160  Please bring your blood sugar monitor to each visit, thank you
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
## 2017-07-01 ENCOUNTER — Other Ambulatory Visit: Payer: Self-pay

## 2017-07-01 DIAGNOSIS — Z79890 Hormone replacement therapy (postmenopausal): Secondary | ICD-10-CM

## 2017-07-01 MED ORDER — ESTRADIOL 0.025 MG/24HR TD PTWK: 0.0250 mg | MEDICATED_PATCH | TRANSDERMAL | 2 refills | Status: DC

## 2017-07-02 ENCOUNTER — Other Ambulatory Visit: Payer: Self-pay

## 2017-07-02 DIAGNOSIS — N952 Postmenopausal atrophic vaginitis: Secondary | ICD-10-CM

## 2017-07-02 DIAGNOSIS — Z79890 Hormone replacement therapy (postmenopausal): Secondary | ICD-10-CM

## 2017-07-02 MED ORDER — ESTRADIOL 0.025 MG/24HR TD PTWK: 0.0250 mg | MEDICATED_PATCH | TRANSDERMAL | 2 refills | Status: DC

## 2017-07-05 ENCOUNTER — Encounter: Payer: Self-pay | Admitting: Neurology

## 2017-07-07 ENCOUNTER — Ambulatory Visit: Payer: PPO | Admitting: Neurology

## 2017-07-07 ENCOUNTER — Encounter: Payer: Self-pay | Admitting: Neurology

## 2017-07-07 ENCOUNTER — Telehealth: Payer: Self-pay

## 2017-07-07 VITALS — BP 113/65 | HR 86 | Ht 62.0 in | Wt 149.0 lb

## 2017-07-07 DIAGNOSIS — G4733 Obstructive sleep apnea (adult) (pediatric): Secondary | ICD-10-CM

## 2017-07-07 DIAGNOSIS — G43709 Chronic migraine without aura, not intractable, without status migrainosus: Secondary | ICD-10-CM | POA: Diagnosis not present

## 2017-07-07 DIAGNOSIS — Z9989 Dependence on other enabling machines and devices: Secondary | ICD-10-CM

## 2017-07-07 NOTE — Telephone Encounter (Signed)
I had an extended conversation with the pt and her husband today regarding concerns about the pt's cpap. I encouraged pt to speak with [facility] regarding the mask leak and their questions about getting the daily AHI sent to them. I advised them that Dr. Rexene Alberts will review the therapy report and decide on a pressure change and I will send this order to [facility]. A follow up appt was made for 09/11/17 at 2:00pm to check how she is doing with the pressure change. Pt and husband were in agreement to this plan.

## 2017-07-07 NOTE — Telephone Encounter (Signed)
I reviewed patient's CPAP compliance data from 06/06/2017 through 07/05/2017, which is a total of 30 days, during which time she used her CPAP 29 days with percent used days greater than 4 hours at 93%, indicating excellent compliance with an average usage of 6 hours and 7 minutes, residual AHI at suboptimal at 10.2 per hour, primarily with obstructive events noted, leak on the higher side with the 95th percentile at 22.6 L/m on a pressure of 6 cm with EPR. I will increase her CPAP pressure to 8 cm. She will be advised to go to her DME company for a mask refit.
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
## 2017-07-08 ENCOUNTER — Encounter: Payer: Self-pay | Admitting: Women's Health

## 2017-07-08 ENCOUNTER — Ambulatory Visit: Payer: PPO | Admitting: Women's Health

## 2017-07-08 VITALS — BP 140/80

## 2017-07-08 DIAGNOSIS — N898 Other specified noninflammatory disorders of vagina: Secondary | ICD-10-CM | POA: Diagnosis not present

## 2017-07-08 DIAGNOSIS — N760 Acute vaginitis: Secondary | ICD-10-CM

## 2017-07-08 DIAGNOSIS — B9689 Other specified bacterial agents as the cause of diseases classified elsewhere: Secondary | ICD-10-CM | POA: Diagnosis not present

## 2017-07-08 DIAGNOSIS — N9089 Other specified noninflammatory disorders of vulva and perineum: Secondary | ICD-10-CM

## 2017-07-08 LAB — WET PREP FOR TRICH, YEAST, CLUE

## 2017-07-08 MED ORDER — METRONIDAZOLE 0.75 % VA GEL: VAGINAL | 0 refills | Status: DC

## 2017-07-08 NOTE — Progress Notes (Signed)
Presents for skin tag on right labia, noticed 2 weeks ago, causing vaginal irritation with itching. Denies dysuria, vaginal discharge, and vaginal odor. Reports prior skin tag removals to other parts of body, but no history of skin cancer. Wears pads every day for possible urine leakage. Postmenopausal/no bleeding/on HRT [place] estradiol patch 0.025. Medical history significant for migraines, hypertension, and diabetes.   Exam: Appears well. External genitalia vaginal erythema, no odor, half centimeter skin tag noted on right labia majora, skin-colored with wide base. Wet prep done with a Q-tip: many bacteria, clue cells present Applied Xylocaine jelly, TCA 80% to skin tag .  Patient tolerated well.  Small Skin tag on right labia Bacterial vaginosis  Plan: MetroGel 0.75% applied at bedtime x 5 nights. Reviewed alcohol precautions. Reassured patient that skin tag does not appear to be concerning. Skin tag expected to fall off within the next few days. Instructed patient to call or return to office if problems encountered with skin tag or vaginal irritation persists.

## 2017-07-08 NOTE — Telephone Encounter (Signed)
Order for cpap pressure changed sent to [facility].

## 2017-07-08 NOTE — Patient Instructions (Signed)
Skin Tag, Adult A skin tag (acrochordon) is a soft, extra growth of skin. Most skin tags are flesh-colored and rarely bigger than a pencil eraser. They commonly form near areas where there are folds in the skin, such as the armpit or groin. Skin tags are not dangerous, and they do not spread from person to person (are not contagious). You may have one skin tag or several. Skin tags do not require treatment. However, your health care provider may recommend removal of a skin tag if it:  Gets irritated from clothing.  Bleeds.  Is visible and unsightly.  Your health care provider can remove skin tags with a simple surgical procedure or a procedure that involves freezing the skin tag. Follow these instructions at home:  Watch for any changes in your skin tag. A normal skin tag does not require any other special care at home.  Take over-the-counter and prescription medicines only as told by your health care provider.  Keep all follow-up visits as told by your health care provider. This is important. Contact a health care provider if:  You have a skin tag that: ? Becomes painful. ? Changes color. ? Bleeds. ? Swells.  You develop more skin tags. This information is not intended to replace advice given to you by your health care provider. Make sure you discuss any questions you have with your health care provider. Document Released: 08/27/2015 Document Revised: 04/07/2016 Document Reviewed: 08/27/2015 Elsevier Interactive Patient Education  2018 [employer]. Bacterial Vaginosis Bacterial vaginosis is an infection of the vagina. It happens when too many germs (bacteria) grow in the vagina. This infection puts you at risk for infections from sex (STIs). Treating this infection can lower your risk for some STIs. You should also treat this if you are pregnant. It can cause your baby to be born early. Follow these instructions at home: Medicines  Take over-the-counter and prescription  medicines only as told by your doctor.  Take or use your antibiotic medicine as told by your doctor. Do not stop taking or using it even if you start to feel better. General instructions  If you your sexual partner is a woman, tell her that you have this infection. She needs to get treatment if she has symptoms. If you have a female partner, he does not need to be treated.  During treatment: ? Avoid sex. ? Do not douche. ? Avoid alcohol as told. ? Avoid breastfeeding as told.  Drink enough fluid to keep your pee (urine) clear or pale yellow.  Keep your vagina and butt (rectum) clean. ? Wash the area with warm water every day. ? Wipe from front to back after you use the toilet.  Keep all follow-up visits as told by your doctor. This is important. Preventing this condition  Do not douche.  Use only warm water to wash around your vagina.  Use protection when you have sex. This includes: ? Latex condoms. ? Dental dams.  Limit how many people you have sex with. It is best to only have sex with the same person (be monogamous).  Get tested for STIs. Have your partner get tested.  Wear underwear that is cotton or lined with cotton.  Avoid tight pants and pantyhose. This is most important in summer.  Do not use any products that have nicotine or tobacco in them. These include cigarettes and e-cigarettes. If you need help quitting, ask your doctor.  Do not use illegal drugs.  Limit how much alcohol you drink. Contact  a doctor if:  Your symptoms do not get better, even after you are treated.  You have more discharge or pain when you pee (urinate).  You have a fever.  You have pain in your belly (abdomen).  You have pain with sex.  Your bleed from your vagina between periods. Summary  This infection happens when too many germs (bacteria) grow in the vagina.  Treating this condition can lower your risk for some infections from sex (STIs).  You should also treat this if  you are pregnant. It can cause early (premature) birth.  Do not stop taking or using your antibiotic medicine even if you start to feel better. This information is not intended to replace advice given to you by your health care provider. Make sure you discuss any questions you have with your health care provider. Document Released: 05/21/2008 Document Revised: 04/27/2016 Document Reviewed: 04/27/2016 Elsevier Interactive Patient Education  2017 [employer].

## 2017-07-14 DIAGNOSIS — H31012 Macula scars of posterior pole (postinflammatory) (post-traumatic), left eye: Secondary | ICD-10-CM | POA: Diagnosis not present

## 2017-07-14 DIAGNOSIS — H26491 Other secondary cataract, right eye: Secondary | ICD-10-CM | POA: Diagnosis not present

## 2017-07-14 DIAGNOSIS — E119 Type 2 diabetes mellitus without complications: Secondary | ICD-10-CM | POA: Diagnosis not present

## 2017-07-14 DIAGNOSIS — H35342 Macular cyst, hole, or pseudohole, left eye: Secondary | ICD-10-CM | POA: Diagnosis not present

## 2017-07-14 DIAGNOSIS — H43811 Vitreous degeneration, right eye: Secondary | ICD-10-CM | POA: Diagnosis not present

## 2017-07-14 DIAGNOSIS — Z961 Presence of intraocular lens: Secondary | ICD-10-CM | POA: Diagnosis not present

## 2017-07-29 DIAGNOSIS — J301 Allergic rhinitis due to pollen: Secondary | ICD-10-CM | POA: Diagnosis not present

## 2017-07-29 DIAGNOSIS — J3089 Other allergic rhinitis: Secondary | ICD-10-CM | POA: Diagnosis not present

## 2017-08-06 ENCOUNTER — Other Ambulatory Visit: Payer: Self-pay

## 2017-08-07 ENCOUNTER — Other Ambulatory Visit: Payer: Self-pay

## 2017-08-07 DIAGNOSIS — J3089 Other allergic rhinitis: Secondary | ICD-10-CM | POA: Diagnosis not present

## 2017-08-07 DIAGNOSIS — J301 Allergic rhinitis due to pollen: Secondary | ICD-10-CM | POA: Diagnosis not present

## 2017-08-07 MED ORDER — REPAGLINIDE 1 MG PO TABS: ORAL_TABLET | ORAL | 1 refills | Status: DC

## 2017-08-14 ENCOUNTER — Ambulatory Visit: Payer: PPO | Admitting: Neurology

## 2017-08-14 ENCOUNTER — Encounter: Payer: Self-pay | Admitting: Neurology

## 2017-08-14 VITALS — BP 138/81 | HR 107

## 2017-08-14 DIAGNOSIS — G43709 Chronic migraine without aura, not intractable, without status migrainosus: Secondary | ICD-10-CM

## 2017-08-14 NOTE — Progress Notes (Signed)
Botox-100 units x 2 vials Lot: [id number] Expiration: 01/2020 NDC: 0023-1145-01  Bacteriostatic 0.9% Sodium Chloride- 4mL total Lot: [id number] Expiration: 12/25/2018 NDC: 0409-1966-02 Dx: G43.709 B/B //BCrn  

## 2017-08-20 NOTE — Progress Notes (Signed)

## 2017-08-31 ENCOUNTER — Other Ambulatory Visit: Payer: Self-pay | Admitting: Internal Medicine

## 2017-09-01 NOTE — Telephone Encounter (Signed)
Routing to dr plotnikov, please advise, thanks 

## 2017-09-03 ENCOUNTER — Encounter: Payer: Self-pay | Admitting: Physician Assistant

## 2017-09-08 ENCOUNTER — Other Ambulatory Visit: Payer: Self-pay

## 2017-09-08 MED ORDER — PANTOPRAZOLE SODIUM 40 MG PO TBEC: 40.0000 mg | DELAYED_RELEASE_TABLET | Freq: Two times a day (BID) | ORAL | 3 refills | Status: DC

## 2017-09-09 ENCOUNTER — Encounter: Payer: Self-pay | Admitting: Nurse Practitioner

## 2017-09-11 ENCOUNTER — Ambulatory Visit: Payer: PPO | Admitting: Neurology

## 2017-09-11 ENCOUNTER — Encounter: Payer: Self-pay | Admitting: Neurology

## 2017-09-11 VITALS — BP 148/80 | HR 100 | Ht 62.5 in | Wt 148.0 lb

## 2017-09-11 DIAGNOSIS — G4733 Obstructive sleep apnea (adult) (pediatric): Secondary | ICD-10-CM

## 2017-09-11 DIAGNOSIS — Z9989 Dependence on other enabling machines and devices: Secondary | ICD-10-CM

## 2017-09-11 NOTE — Patient Instructions (Signed)
Please continue using your CPAP regularly. While your insurance requires that you use CPAP at least 4 hours each night on 70% of the nights, I recommend, that you not skip any nights and use it throughout the night if you can. Getting used to CPAP and staying with the treatment long term does take time and patience and discipline. Untreated obstructive sleep apnea when it is moderate to severe can have an adverse impact on cardiovascular health and raise her risk for heart disease, arrhythmias, hypertension, congestive heart failure, stroke and diabetes. Untreated obstructive sleep apnea causes sleep disruption, nonrestorative sleep, and sleep deprivation. This can have an impact on your day to day functioning and cause daytime sleepiness and impairment of cognitive function, memory loss, mood disturbance, and problems focussing. Using CPAP regularly can improve these symptoms. Your sleep apnea control is better on 8 cm and you are able to tolerate the nasal pillows.  You can follow up in one year, you can see Cecille Rubin, nurse practitioner.
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
## 2017-09-11 NOTE — Progress Notes (Deleted)
Subjective:    Patient ID: [name] is a 77 y.o. female.  HPI {Common ambulatory SmartLinks:[id number]}  Review of Systems  Neurological:       Pt presents today to discuss her cpap pressure. Pt is concerned because the mask leaves marks on her face.    Objective:  Neurological Exam  Physical Exam  Assessment:   ***  Plan:   ***

## 2017-09-24 ENCOUNTER — Ambulatory Visit: Payer: PPO | Admitting: Internal Medicine

## 2017-09-29 ENCOUNTER — Other Ambulatory Visit (INDEPENDENT_AMBULATORY_CARE_PROVIDER_SITE_OTHER): Payer: PPO

## 2017-09-29 DIAGNOSIS — Z794 Long term (current) use of insulin: Secondary | ICD-10-CM | POA: Diagnosis not present

## 2017-09-29 DIAGNOSIS — R5383 Other fatigue: Secondary | ICD-10-CM | POA: Diagnosis not present

## 2017-09-29 DIAGNOSIS — E1165 Type 2 diabetes mellitus with hyperglycemia: Secondary | ICD-10-CM | POA: Diagnosis not present

## 2017-09-29 DIAGNOSIS — J301 Allergic rhinitis due to pollen: Secondary | ICD-10-CM | POA: Diagnosis not present

## 2017-09-29 DIAGNOSIS — J3089 Other allergic rhinitis: Secondary | ICD-10-CM | POA: Diagnosis not present

## 2017-09-29 LAB — URINALYSIS, ROUTINE W REFLEX MICROSCOPIC
Bilirubin Urine: NEGATIVE
KETONES UR: NEGATIVE
Nitrite: POSITIVE — AB
SPECIFIC GRAVITY, URINE: 1.01 (ref 1.000–1.030)
TOTAL PROTEIN, URINE-UPE24: NEGATIVE
URINE GLUCOSE: NEGATIVE
UROBILINOGEN UA: 0.2 (ref 0.0–1.0)
pH: 7.5 (ref 5.0–8.0)

## 2017-09-29 LAB — LIPID PANEL
Cholesterol: 129 mg/dL (ref 0–200)
HDL: 77.6 mg/dL (ref >39.00)
LDL Cholesterol: 39 mg/dL (ref 0–99)
NONHDL: 50.97
TRIGLYCERIDES: 58 mg/dL (ref 0.0–149.0)
Total CHOL/HDL Ratio: 2
VLDL: 11.6 mg/dL (ref 0.0–40.0)

## 2017-09-29 LAB — MICROALBUMIN / CREATININE URINE RATIO
Creatinine,U: 84.4 mg/dL
MICROALB UR: 1.8 mg/dL (ref 0.0–1.9)
Microalb Creat Ratio: 2.1 mg/g (ref 0.0–30.0)

## 2017-09-29 LAB — BASIC METABOLIC PANEL
BUN: 16 mg/dL (ref 6–23)
CALCIUM: 9.7 mg/dL (ref 8.4–10.5)
CHLORIDE: 98 meq/L (ref 96–112)
CO2: 29 mEq/L (ref 19–32)
Creatinine, Ser: 0.97 mg/dL (ref 0.40–1.20)
GFR: 59.33 mL/min — ABNORMAL LOW (ref >60.00)
GLUCOSE: 123 mg/dL — AB (ref 70–99)
POTASSIUM: 4.3 meq/L (ref 3.5–5.1)
SODIUM: 135 meq/L (ref 135–145)

## 2017-09-29 LAB — TSH: TSH: 2.02 u[IU]/mL (ref 0.35–4.50)

## 2017-09-29 LAB — HEMOGLOBIN A1C: Hgb A1c MFr Bld: 6.5 % (ref 4.6–6.5)

## 2017-09-30 LAB — FRUCTOSAMINE: FRUCTOSAMINE: 286 umol/L — AB (ref 0–285)

## 2017-10-01 ENCOUNTER — Encounter: Payer: Self-pay | Admitting: Endocrinology

## 2017-10-01 ENCOUNTER — Ambulatory Visit: Payer: PPO | Admitting: Endocrinology

## 2017-10-01 VITALS — BP 118/78 | HR 95 | Ht 62.5 in | Wt 150.2 lb

## 2017-10-01 DIAGNOSIS — Z794 Long term (current) use of insulin: Secondary | ICD-10-CM

## 2017-10-01 DIAGNOSIS — E1165 Type 2 diabetes mellitus with hyperglycemia: Secondary | ICD-10-CM | POA: Diagnosis not present

## 2017-10-01 NOTE — Patient Instructions (Signed)
Check blood sugars on waking up  2-3/7  Also check blood sugars about 2 hours after a meal and do this after different meals by rotation  Recommended blood sugar levels on waking up is 90-130 and about 2 hours after meal is 130-180  Please bring your blood sugar monitor to each visit, thank you    
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
## 2017-10-06 ENCOUNTER — Encounter: Payer: Self-pay | Admitting: Internal Medicine

## 2017-10-06 ENCOUNTER — Ambulatory Visit (INDEPENDENT_AMBULATORY_CARE_PROVIDER_SITE_OTHER): Payer: PPO | Admitting: Internal Medicine

## 2017-10-06 VITALS — BP 126/82 | HR 101 | Temp 97.6°F | Ht 62.5 in | Wt 149.0 lb

## 2017-10-06 DIAGNOSIS — F411 Generalized anxiety disorder: Secondary | ICD-10-CM

## 2017-10-06 DIAGNOSIS — E118 Type 2 diabetes mellitus with unspecified complications: Secondary | ICD-10-CM

## 2017-10-06 DIAGNOSIS — J069 Acute upper respiratory infection, unspecified: Secondary | ICD-10-CM | POA: Diagnosis not present

## 2017-10-06 DIAGNOSIS — M5431 Sciatica, right side: Secondary | ICD-10-CM

## 2017-10-06 NOTE — Patient Instructions (Signed)
You can use over-the-counter  "cold" medicines  such as "Afrin" nasal spray for nasal congestion as directed. Use " Delsym" or" Robitussin" cough syrup varietis for cough.  You can use plain "Tylenol" or "Advil" for fever, chills and achyness. Use Halls or Ricola cough drops.   "Common cold" symptoms are usually triggered by a virus.  The antibiotics are usually not necessary. On average, a" viral cold" illness would take 4-7 days to resolve.   Please, make an appointment if you are not better or if you're worse.  

## 2017-10-06 NOTE — Assessment & Plan Note (Signed)
Xanax prn 

## 2017-10-06 NOTE — Assessment & Plan Note (Signed)
Tramadol prn 

## 2017-10-06 NOTE — Assessment & Plan Note (Signed)
OTC meds 

## 2017-10-06 NOTE — Progress Notes (Signed)
Subjective:  Patient ID: [name], female    DOB: 21-Sep-1940  Age: 77 y.o. MRN: [id number]  CC: No chief complaint on file.   HPI YATZIL CLIPPINGER presents for URI x 1 week  3 mo f/u for DM, HTN, LBP  Outpatient Medications Prior to Visit  Medication Sig Dispense Refill  . alendronate (FOSAMAX) 70 MG tablet TAKE 1 TABLET BY MOUTH ONCE WEEKLY, TAKE WITH FULL GLASS OF WATER ON EMPTY STOMACH  4  . aspirin EC 81 MG tablet Take 1 tablet (81 mg total) by mouth daily. 100 tablet 3  . b complex vitamins tablet Take 1 tablet by mouth daily. Vitamin supplement    . BD PEN NEEDLE NANO U/F 32G X 4 MM MISC USE AS DIRECTED TWICE DAILY. 200 each 3  . Brexpiprazole (REXULTI) 1 MG TABS Take 1 mg by mouth. Every third day (Monday & Thursday)    . busPIRone (BUSPAR) 15 MG tablet Take 1 tablet by mouth 2 (two) times daily.    . calcium-vitamin D (OSCAL WITH D) 500-200 MG-UNIT per tablet Take 1 tablet by mouth daily with breakfast.    . cetirizine (ZYRTEC) 10 MG tablet Take 10 mg by mouth daily as needed for allergies.     . cholecalciferol (VITAMIN D) 1000 units tablet Take 2,000 Units by mouth daily.    Marland Kitchen conjugated estrogens (PREMARIN) vaginal cream Place 1 Applicatorful vaginally 2 (two) times a week. 42.5 g 4  . dicyclomine (BENTYL) 10 MG capsule TAKE 1 CAPSULE (10 MG TOTAL) BY MOUTH 3 (THREE) TIMES DAILY BEFORE MEALS. 270 capsule 1  . estradiol (CLIMARA - DOSED IN MG/24 HR) 0.025 mg/24hr patch Place 1 patch (0.025 mg total) once a week onto the skin. APPLY 1 PATCH ONTO SKIN ONCE WEEKLY 12 patch 2  . furosemide (LASIX) 20 MG tablet Take 1-2 tablets (20-40 mg total) by mouth daily. 60 tablet 5  . insulin lispro (HUMALOG KWIKPEN) 100 UNIT/ML KiwkPen 4-5 units before dinner 9 mL 2  . L-Methylfolate (DEPLIN) 15 MG TABS Take 15 mg by mouth daily. For treatment of depression    . linaclotide (LINZESS) 145 MCG CAPS capsule Take 1 capsule (145 mcg total) by mouth daily. (Patient taking differently: Take 72  mcg by mouth daily. ) 30 capsule 11  . linaclotide (LINZESS) 72 MCG capsule Take 1 capsule (72 mcg total) by mouth daily before breakfast. Take with Linzess 145 mcg daily. 30 capsule 6  . Liniments (SALONPAS PAIN RELIEF PATCH EX) Apply 1 patch topically at bedtime.    Marland Kitchen LORazepam (ATIVAN) 1 MG tablet Take 1 mg by mouth 4 (four) times daily as needed for anxiety.   2  . lovastatin (MEVACOR) 20 MG tablet Take 20 mg by mouth every other day.    . Magnesium 500 MG CAPS Take 1 capsule by mouth daily.    . metFORMIN (GLUCOPHAGE-XR) 500 MG 24 hr tablet Take 2 tablets (1,000 mg total) by mouth 2 (two) times daily. For DM type 2 360 tablet 3  . Multiple Vitamins-Minerals (MULTIVITAMIN,TX-MINERALS) tablet Take 1 tablet by mouth daily.     . ondansetron (ZOFRAN) 4 MG tablet Take 4 mg by mouth every 8 (eight) hours as needed for nausea or vomiting.    Glory Rosebush DELICA LANCETS 54O MISC USE UTD UP TO QID 200 each 3  . ONETOUCH VERIO test strip Use to check blood sugars 5 times daily. 450 each 3  . pantoprazole (PROTONIX) 40 MG tablet Take 1  tablet (40 mg total) by mouth 2 (two) times daily. 180 tablet 3  . pioglitazone (ACTOS) 45 MG tablet Take 1 tablet (45 mg total) by mouth daily. 90 tablet 3  . SUMAtriptan (IMITREX) 100 MG tablet Take 1 tablet (100 mg total) by mouth once as needed for migraine. May repeat in 2 hours if headache persists or recurs. 10 tablet 11  . Testosterone POWD APPLY A TINY PEA-SIZED AMOUNT ONCE A DAY. (Patient taking differently: APPLY A TINY PEA-SIZED AMOUNT ONCE A DAY.  4 percert) 30 g 5  . traMADol-acetaminophen (ULTRACET) 37.5-325 MG tablet TAKE 1 TABLET BY MOUTH EVERY 8 HOURS AS NEEDED 90 tablet 1  . TRINTELLIX 20 MG TABS Take 20 mg by mouth daily.  2  . VICTOZA 18 MG/3ML SOPN INJECT 1.2MG  INTO MUSCLE DAILY 12 pen 2  . VOLTAREN 1 % GEL APPLY 2 GRAMS TO KNEE 2-3 TIMES DAILY PRN  1   No facility-administered medications prior to visit.     ROS Review of Systems    Constitutional: Positive for fatigue. Negative for activity change, appetite change, chills, fever and unexpected weight change.  HENT: Positive for rhinorrhea, sinus pressure and sore throat. Negative for congestion and mouth sores.   Eyes: Negative for visual disturbance.  Respiratory: Negative for cough, chest tightness and wheezing.   Gastrointestinal: Negative for abdominal pain and nausea.  Genitourinary: Negative for difficulty urinating, frequency and vaginal pain.  Musculoskeletal: Positive for arthralgias and back pain. Negative for gait problem.  Skin: Negative for pallor and rash.  Neurological: Negative for dizziness, tremors, weakness, numbness and headaches.  Psychiatric/Behavioral: Positive for dysphoric mood. Negative for confusion, sleep disturbance and suicidal ideas.    Objective:  BP 126/82 (BP Location: Left Arm, Patient Position: Sitting, Cuff Size: Normal)   Pulse (!) 101   Temp 97.6 F (36.4 C) (Oral)   Ht 5' 2.5" (1.588 m)   Wt 149 lb (67.6 kg)   SpO2 98%   BMI 26.82 kg/m   BP Readings from Last 3 Encounters:  10/06/17 126/82  10/01/17 118/78  09/11/17 (!) 148/80    Wt Readings from Last 3 Encounters:  10/06/17 149 lb (67.6 kg)  10/01/17 150 lb 3.2 oz (68.1 kg)  09/11/17 148 lb (67.1 kg)    Physical Exam  Constitutional: She appears well-developed. No distress.  HENT:  Head: Normocephalic.  Right Ear: External ear normal.  Left Ear: External ear normal.  Nose: Nose normal.  Mouth/Throat: Oropharynx is clear and moist.  Eyes: Conjunctivae are normal. Pupils are equal, round, and reactive to light. Right eye exhibits no discharge. Left eye exhibits no discharge.  Neck: Normal range of motion. Neck supple. No JVD present. No tracheal deviation present. No thyromegaly present.  Cardiovascular: Normal rate, regular rhythm and normal heart sounds.  Pulmonary/Chest: No stridor. No respiratory distress. She has no wheezes.  Abdominal: Soft. Bowel  sounds are normal. She exhibits no distension and no mass. There is no tenderness. There is no rebound and no guarding.  Musculoskeletal: She exhibits no edema or tenderness.  Lymphadenopathy:    She has no cervical adenopathy.  Neurological: She displays normal reflexes. No cranial nerve deficit. She exhibits normal muscle tone. Coordination normal.  Skin: No rash noted. No erythema.  Psychiatric: She has a normal mood and affect. Her behavior is normal. Judgment and thought content normal.  runny nose  Lab Results  Component Value Date   WBC 7.2 06/11/2017   HGB 11.4 (L) 06/11/2017   HCT  33.5 (L) 06/11/2017   PLT 424 (H) 06/11/2017   GLUCOSE 123 (H) 09/29/2017   CHOL 129 09/29/2017   TRIG 58.0 09/29/2017   HDL 77.60 09/29/2017   LDLCALC 39 09/29/2017   ALT 14 03/10/2017   AST 20 03/10/2017   NA 135 09/29/2017   K 4.3 09/29/2017   CL 98 09/29/2017   CREATININE 0.97 09/29/2017   BUN 16 09/29/2017   CO2 29 09/29/2017   TSH 2.02 09/29/2017   INR 0.93 11/02/2013   HGBA1C 6.5 09/29/2017   MICROALBUR 1.8 09/29/2017    Mm Screening Breast Tomo Bilateral  Result Date: 05/27/2017 CLINICAL DATA:  Screening. EXAM: 2D DIGITAL SCREENING BILATERAL MAMMOGRAM WITH CAD AND ADJUNCT TOMO COMPARISON:  Previous exam(s). ACR Breast Density Category b: There are scattered areas of fibroglandular density. FINDINGS: There are no findings suspicious for malignancy. Images were processed with CAD. IMPRESSION: No mammographic evidence of malignancy. A result letter of this screening mammogram will be mailed directly to the patient. RECOMMENDATION: Screening mammogram in one year. (Code:SM-B-01Y) BI-RADS CATEGORY  1: Negative. Electronically Signed   By: Kristopher Oppenheim M.D.   On: 05/27/2017 12:30    Assessment & Plan:   There are no diagnoses linked to this encounter. I am having Maha L. Bose maintain her b complex vitamins, (multivitamin,tx-minerals), cetirizine, DEPLIN, calcium-vitamin D, aspirin  EC, VOLTAREN, LORazepam, busPIRone, Liniments (SALONPAS PAIN RELIEF PATCH EX), dicyclomine, TRINTELLIX, Magnesium, linaclotide, SUMAtriptan, ondansetron, conjugated estrogens, Testosterone, pioglitazone, metFORMIN, ONETOUCH VERIO, ONETOUCH DELICA LANCETS 00F, BD PEN NEEDLE NANO U/F, cholecalciferol, lovastatin, VICTOZA, alendronate, insulin lispro, furosemide, linaclotide, estradiol, Brexpiprazole, traMADol-acetaminophen, and pantoprazole.  No orders of the defined types were placed in this encounter.    Follow-up: No Follow-up on file.  Walker Kehr, MD

## 2017-10-06 NOTE — Assessment & Plan Note (Signed)
Victoza, Metformin, Lantus, Actos

## 2017-10-20 ENCOUNTER — Other Ambulatory Visit: Payer: Self-pay | Admitting: Gastroenterology

## 2017-11-07 ENCOUNTER — Other Ambulatory Visit: Payer: Self-pay | Admitting: Internal Medicine

## 2017-11-07 NOTE — Telephone Encounter (Signed)
Please advise about refill in Dr. Plotnikovs absence. 

## 2017-11-07 NOTE — Telephone Encounter (Signed)
[place] controlled substance database checked.  Ok to fill medication.  

## 2017-11-10 ENCOUNTER — Encounter: Payer: Self-pay | Admitting: Neurology

## 2017-11-10 ENCOUNTER — Telehealth: Payer: Self-pay | Admitting: Neurology

## 2017-11-10 ENCOUNTER — Ambulatory Visit: Payer: PPO | Admitting: Neurology

## 2017-11-10 VITALS — BP 118/76 | HR 117

## 2017-11-10 DIAGNOSIS — G43709 Chronic migraine without aura, not intractable, without status migrainosus: Secondary | ICD-10-CM

## 2017-11-10 MED ORDER — FREMANEZUMAB-VFRM 225 MG/1.5ML ~~LOC~~ SOSY: 225.0000 mg | PREFILLED_SYRINGE | SUBCUTANEOUS | 0 refills | Status: DC

## 2017-11-10 NOTE — Progress Notes (Signed)
aBotox- 100 units x 2 vials Lot: [id number] Expiration: 04/2020 NDC: 2336-1224-49  Bacteriostatic 0.9% Sodium Chloride- 26mL total Lot: [id number] Expiration: 12/25/2018 NDC: 1102-1117-35  Dx: A70.141 B/B //BCrn

## 2017-11-10 NOTE — Telephone Encounter (Signed)
12 week botox

## 2017-11-10 NOTE — Progress Notes (Signed)
Interval history 07/07/2017: Patient here for follow up of migraines. Botox with >50% improvement in frequency and severity. She does have a dull headache, She has a dull headache 15x a month but used to have daily headaches. 8 are migrainous. Still this is a significant improvement. Usually when she wakes in the morning she has headaches.She adjusted her cpap in January, saw Dr Rexene Alberts. We will start Ajovy today as well.   Consent Form Botulism Toxin Injection For Chronic Migraine  Botulism toxin has been approved by the Federal drug administration for treatment of chronic migraine. Botulism toxin does not cure chronic migraine and it may not be effective in some patients.  The administration of botulism toxin is accomplished by injecting a small amount of toxin into the muscles of the neck and head. Dosage must be titrated for each individual. Any benefits resulting from botulism toxin tend to wear off after 3 months with a repeat injection required if benefit is to be maintained. Injections are usually done every 3-4 months with maximum effect peak achieved by about 2 or 3 weeks. Botulism toxin is expensive and you should be sure of what costs you will incur resulting from the injection.  The side effects of botulism toxin use for chronic migraine may include:   -Transient, and usually mild, facial weakness with facial injections  -Transient, and usually mild, head or neck weakness with head/neck injections  -Reduction or loss of forehead facial animation due to forehead muscle              weakness  -Eyelid drooping  -Dry eye  -Pain at the site of injection or bruising at the site of injection  -Double vision  -Potential unknown long term risks  Contraindications: You should not have Botox if you are pregnant, nursing, allergic to albumin, have an infection, skin condition, or muscle weakness at the site of the injection, or have myasthenia gravis, Lambert-Eaton syndrome, or ALS.  It is  also possible that as with any injection, there may be an allergic reaction or no effect from the medication. Reduced effectiveness after repeated injections is sometimes seen and rarely infection at the injection site may occur. All care will be taken to prevent these side effects. If therapy is given over a long time, atrophy and wasting in the muscle injected may occur. Occasionally the patient's become refractory to treatment because they develop antibodies to the toxin. In this event, therapy needs to be modified.  I have read the above information and consent to the administration of botulism toxin.    ______________  _____   _________________  Patient signature     Date   Witness signature       BOTOX PROCEDURE NOTE FOR MIGRAINE HEADACHE    Contraindications and precautions discussed with patient(above). Aseptic procedure was observed and patient tolerated procedure. Procedure performed by Dr. Georgia Dom  The condition has existed for more than 6 months, and pt does not have a diagnosis of ALS, Myasthenia Gravis or Lambert-Eaton Syndrome. Risks and benefits of injections discussed and pt agrees to proceed with the procedure. Written consent obtained  These injections are medically necessary. He receives good benefits from these injections. These injections do not cause sedations or hallucinations which the oral therapies may cause.  Indication/Diagnosis: chronic migraine BOTOX(J0585) injection was performed according to protocol by [employer]. 200 units of BOTOX was dissolved into 4 cc NS.  NDC: [address]-02 Description of procedure:  The patient was placed in a sitting position.  The standard protocol was used for Botox as follows, with 5 units of Botox injected at each site:   -Procerus muscle, midline injection  -Corrugator muscle, bilateral injection  -Frontalis muscle, bilateral injection, with 2 sites each side, medial injection was performed in the upper one third of  the frontalis muscle, in the region vertical from the medial inferior edge of the superior orbital rim. The lateral injection was again in the upper one third of the forehead vertically above the lateral limbus of the cornea, 1.5 cm lateral to the medial injection site.  -Temporalis muscle injection, 4 sites, bilaterally. The first injection was 3 cm above the tragus of the ear, second injection site was 1.5 cm to 3 cm up from the first injection site in line with the tragus of the ear. The third injection site was 1.5-3 cm forward between the first 2 injection sites. The fourth injection site was 1.5 cm posterior to the second injection site.  -Occipitalis muscle injection, 3 sites, bilaterally. The first injection was done one half way between the occipital protuberance and the tip of the mastoid process behind the ear. The second injection site was done lateral and superior to the first, 1 fingerbreadth from the first injection. The third injection site was 1 fingerbreadth superiorly and medially from the first injection site.  -Cervical paraspinal muscle injection, 2 sites, bilateral knee first injection site was 1 cm from the midline of the cervical spine, 3 cm inferior to the lower border of the occipital protuberance. The second injection site was 1.5 cm superiorly and laterally to the first injection site.  -Trapezius muscle injection was performed at 3 sites, bilaterally. The first injection site was in the upper trapezius muscle halfway between the inflection point of the neck, and the acromion. The second injection site was one half way between the acromion and the first injection site. The third injection was done between the first injection site and the inflection point of the neck.   Will return for repeat injection in 3 months.   A 200 unit sof Botox was used, 155 units were injected, the rest of the Botox was wasted. The patient tolerated the procedure well, there were no complications  of the above procedure.

## 2017-11-10 NOTE — Patient Instructions (Signed)
Fremanezumab: Patient drug information Access [employer] here. Copyright 1978-2019 [employer] All rights reserved. (For additional information see "Fremanezumab: Drug information") Brand Names: [place]  Ajovy  What is this drug used for?   It is used to prevent migraine headaches.  What do I need to tell my doctor BEFORE I take this drug?   If you have an allergy to this drug or any part of this drug.   If you are allergic to any drugs like this one, any other drugs, foods, or other substances. Tell your doctor about the allergy and what signs you had, like rash; hives; itching; shortness of breath; wheezing; cough; swelling of face, lips, tongue, or throat; or any other signs.   This drug may interact with other drugs or health problems.   Tell your doctor and pharmacist about all of your drugs (prescription or OTC, natural products, vitamins) and health problems. You must check to make sure that it is safe for you to take this drug with all of your drugs and health problems. Do not start, stop, or change the dose of any drug without checking with your doctor.  What are some things I need to know or do while I take this drug?   Tell all of your health care providers that you take this drug. This includes your doctors, nurses, pharmacists, and dentists.   Allergic reactions have happened within hours and up to 1 month after this drug was given. Tell your doctor right away about any bad effects after getting this drug.   Tell your doctor if you are pregnant or plan on getting pregnant. You will need to talk about the benefits and risks of using this drug while you are pregnant.   Tell your doctor if you are breast-feeding. You will need to talk about any risks to your baby.  What are some side effects that I need to call my doctor about right away?   WARNING/CAUTION: Even though it may be rare, some people may have very bad and sometimes deadly side effects when taking a drug. Tell your  doctor or get medical help right away if you have any of the following signs or symptoms that may be related to a very bad side effect:   Signs of an allergic reaction, like rash; hives; itching; red, swollen, blistered, or peeling skin with or without fever; wheezing; tightness in the chest or throat; trouble breathing, swallowing, or talking; unusual hoarseness; or swelling of the mouth, face, lips, tongue, or throat.   Very bad irritation where the shot was given.  What are some other side effects of this drug?   All drugs may cause side effects. However, many people have no side effects or only have minor side effects. Call your doctor or get medical help if any of these side effects or any other side effects bother you or do not go away:   Irritation where the shot is given.   These are not all of the side effects that may occur. If you have questions about side effects, call your doctor. Call your doctor for medical advice about side effects.   You may report side effects to your national health agency.  How is this drug best taken?   Use this drug as ordered by your doctor. Read all information given to you. Follow all instructions closely.   It is given as a shot into the fatty part of the skin on the top of the thigh, belly area,   or upper arm.   If you will be giving yourself the shot, your doctor or nurse will teach you how to give the shot.   Follow how to use as you have been told by the doctor or read the package insert.   Wash your hands before use.   If stored in a refrigerator, let this drug come to room temperature before using it. Leave it at room temperature for at least 30 minutes. Do not heat this drug.   Do not shake.   Do not give into skin that is irritated, bruised, red, infected, or scarred.   Do not give into the same place as another shot.   If your dose is more than 1 injection, you may give it in the same body part. Make sure you do not give injections in the same  spot you used for the other injections.   Do not use if the solution is cloudy, leaking, or has particles.   This drug is colorless to a faint yellow. Do not use if the solution changes color.   Each prefilled syringe is for one use only.   Throw away needles in a needle/sharp disposal box. Do not reuse needles or other items. When the box is full, follow all local rules for getting rid of it. Talk with a doctor or pharmacist if you have any questions.  What do I do if I miss a dose?   Take a missed dose as soon as you think about it.   After taking a missed dose, start a new schedule based on when the dose is taken.  How do I store and/or throw out this drug?   Store in a refrigerator. Do not freeze.   Store in the carton to protect from light.   If needed, this drug can be left out at room temperature for up to 24 hours. Throw away drug if left at room temperature for more than 24 hours.   Protect from heat and sunlight.   Keep all drugs in a safe place. Keep all drugs out of the reach of children and pets.   Throw away unused or expired drugs. Do not flush down a toilet or pour down a drain unless you are told to do so. Check with your pharmacist if you have questions about the best way to throw out drugs. There may be drug take-back programs in your area.  General drug facts   If your symptoms or health problems do not get better or if they become worse, call your doctor.   Do not share your drugs with others and do not take anyone else's drugs.   Keep a list of all your drugs (prescription, natural products, vitamins, OTC) with you. Give this list to your doctor.   Talk with the doctor before starting any new drug, including prescription or OTC, natural products, or vitamins.   Some drugs may have another patient information leaflet. If you have any questions about this drug, please talk with your doctor, nurse, pharmacist, or other health care provider.   If you think there has been an  overdose, call your poison control center or get medical care right away. Be ready to tell or show what was taken, how much, and when it happened.   

## 2017-11-11 MED ORDER — FREMANEZUMAB-VFRM 225 MG/1.5ML ~~LOC~~ SOSY: 225.0000 mg | PREFILLED_SYRINGE | SUBCUTANEOUS | 11 refills | Status: DC

## 2017-11-11 NOTE — Telephone Encounter (Signed)
I called to schedule the patient, she did not answer so I left a VM asking her to call me back.  °

## 2017-11-18 NOTE — Telephone Encounter (Signed)
Scheduled patient for Botox injection on 03-03-18 at 3:30pm with Dr. Jaynee Eagles.

## 2017-11-19 NOTE — Telephone Encounter (Signed)
Noted, thank you

## 2017-12-02 ENCOUNTER — Encounter: Payer: Self-pay | Admitting: Internal Medicine

## 2017-12-02 ENCOUNTER — Ambulatory Visit (INDEPENDENT_AMBULATORY_CARE_PROVIDER_SITE_OTHER): Payer: PPO | Admitting: Internal Medicine

## 2017-12-02 ENCOUNTER — Other Ambulatory Visit (INDEPENDENT_AMBULATORY_CARE_PROVIDER_SITE_OTHER): Payer: PPO

## 2017-12-02 DIAGNOSIS — K5904 Chronic idiopathic constipation: Secondary | ICD-10-CM | POA: Diagnosis not present

## 2017-12-02 DIAGNOSIS — F3341 Major depressive disorder, recurrent, in partial remission: Secondary | ICD-10-CM | POA: Diagnosis not present

## 2017-12-02 DIAGNOSIS — R110 Nausea: Secondary | ICD-10-CM

## 2017-12-02 DIAGNOSIS — D508 Other iron deficiency anemias: Secondary | ICD-10-CM | POA: Diagnosis not present

## 2017-12-02 LAB — HEPATIC FUNCTION PANEL
ALT: 14 U/L (ref 0–35)
AST: 19 U/L (ref 0–37)
Albumin: 4.1 g/dL (ref 3.5–5.2)
Alkaline Phosphatase: 60 U/L (ref 39–117)
BILIRUBIN DIRECT: 0.1 mg/dL (ref 0.0–0.3)
BILIRUBIN TOTAL: 0.3 mg/dL (ref 0.2–1.2)
TOTAL PROTEIN: 7 g/dL (ref 6.0–8.3)

## 2017-12-02 LAB — BASIC METABOLIC PANEL
BUN: 16 mg/dL (ref 6–23)
CO2: 29 mEq/L (ref 19–32)
Calcium: 9.7 mg/dL (ref 8.4–10.5)
Chloride: 98 mEq/L (ref 96–112)
Creatinine, Ser: 1.02 mg/dL (ref 0.40–1.20)
GFR: 55.96 mL/min — AB (ref >60.00)
Glucose, Bld: 175 mg/dL — ABNORMAL HIGH (ref 70–99)
POTASSIUM: 4 meq/L (ref 3.5–5.1)
Sodium: 135 mEq/L (ref 135–145)

## 2017-12-02 LAB — CBC WITH DIFFERENTIAL/PLATELET
BASOS PCT: 1.3 % (ref 0.0–3.0)
Basophils Absolute: 0.1 10*3/uL (ref 0.0–0.1)
EOS ABS: 0.3 10*3/uL (ref 0.0–0.7)
Eosinophils Relative: 3.1 % (ref 0.0–5.0)
HCT: 34.1 % — ABNORMAL LOW (ref 36.0–46.0)
Hemoglobin: 11.4 g/dL — ABNORMAL LOW (ref 12.0–15.0)
LYMPHS ABS: 2.3 10*3/uL (ref 0.7–4.0)
Lymphocytes Relative: 24.9 % (ref 12.0–46.0)
MCHC: 33.4 g/dL (ref 30.0–36.0)
MCV: 97.5 fl (ref 78.0–100.0)
Monocytes Absolute: 0.7 10*3/uL (ref 0.1–1.0)
Monocytes Relative: 7.3 % (ref 3.0–12.0)
NEUTROS ABS: 5.8 10*3/uL (ref 1.4–7.7)
NEUTROS PCT: 63.4 % (ref 43.0–77.0)
Platelets: 405 10*3/uL — ABNORMAL HIGH (ref 150.0–400.0)
RBC: 3.5 Mil/uL — ABNORMAL LOW (ref 3.87–5.11)
RDW: 13.6 % (ref 11.5–15.5)
WBC: 9.1 10*3/uL (ref 4.0–10.5)

## 2017-12-02 LAB — URINALYSIS, ROUTINE W REFLEX MICROSCOPIC
BILIRUBIN URINE: NEGATIVE
Hgb urine dipstick: NEGATIVE
Ketones, ur: NEGATIVE
NITRITE: POSITIVE — AB
PH: 7 (ref 5.0–8.0)
RBC / HPF: NONE SEEN (ref 0–?)
Specific Gravity, Urine: 1.015 (ref 1.000–1.030)
TOTAL PROTEIN, URINE-UPE24: NEGATIVE
UROBILINOGEN UA: 0.2 (ref 0.0–1.0)
Urine Glucose: NEGATIVE

## 2017-12-02 LAB — IRON,TIBC AND FERRITIN PANEL
%SAT: 21 % (calc) (ref 11–50)
Ferritin: 51 ng/mL (ref 20–288)
Iron: 60 ug/dL (ref 45–160)
TIBC: 290 ug/dL (ref 250–450)

## 2017-12-02 LAB — TSH: TSH: 1.29 u[IU]/mL (ref 0.35–4.50)

## 2017-12-02 LAB — LIPASE: LIPASE: 13 U/L (ref 11.0–59.0)

## 2017-12-02 MED ORDER — PANCRELIPASE (LIP-PROT-AMYL) 36000-114000 UNITS PO CPEP: 36000.0000 [IU] | ORAL_CAPSULE | Freq: Three times a day (TID) | ORAL | 3 refills | Status: DC

## 2017-12-02 NOTE — Assessment & Plan Note (Signed)
Trintellix, Buspar  F/u w/Dr Toy Care

## 2017-12-02 NOTE — Assessment & Plan Note (Addendum)
Linzess Hold Lovastatin Reduce Victoza dose to 0.6 Gluten free diet Creon 1 capsule with the first bite 2-3 times a day

## 2017-12-02 NOTE — Progress Notes (Signed)
Subjective:  Patient ID: [name], female    DOB: 07-19-1941  Age: 77 y.o. MRN: [id number]  CC: No chief complaint on file.   HPI [name] presents for gastric fullness, poor digestion, constipation, fatigue. F/u DM, HTN f/u  Outpatient Medications Prior to Visit  Medication Sig Dispense Refill  . alendronate (FOSAMAX) 70 MG tablet TAKE 1 TABLET BY MOUTH ONCE WEEKLY, TAKE WITH FULL GLASS OF WATER ON EMPTY STOMACH  4  . aspirin EC 81 MG tablet Take 1 tablet (81 mg total) by mouth daily. 100 tablet 3  . b complex vitamins tablet Take 1 tablet by mouth daily. Vitamin supplement    . BD PEN NEEDLE NANO U/F 32G X 4 MM MISC USE AS DIRECTED TWICE DAILY. 200 each 3  . Brexpiprazole (REXULTI) 1 MG TABS Take 1 mg by mouth. Every third day (Monday & Thursday)    . busPIRone (BUSPAR) 15 MG tablet Take 1 tablet by mouth 2 (two) times daily.    . calcium-vitamin D (OSCAL WITH D) 500-200 MG-UNIT per tablet Take 1 tablet by mouth daily with breakfast.    . cetirizine (ZYRTEC) 10 MG tablet Take 10 mg by mouth daily as needed for allergies.     . cholecalciferol (VITAMIN D) 1000 units tablet Take 2,000 Units by mouth daily.    Marland Kitchen conjugated estrogens (PREMARIN) vaginal cream Place 1 Applicatorful vaginally 2 (two) times a week. 42.5 g 4  . dicyclomine (BENTYL) 10 MG capsule TAKE 1 CAPSULE (10 MG TOTAL) BY MOUTH 3 (THREE) TIMES DAILY BEFORE MEALS. 270 capsule 1  . estradiol (CLIMARA - DOSED IN MG/24 HR) 0.025 mg/24hr patch Place 1 patch (0.025 mg total) once a week onto the skin. APPLY 1 PATCH ONTO SKIN ONCE WEEKLY 12 patch 2  . Fremanezumab-vfrm (AJOVY) 225 MG/1.5ML SOSY Inject 225 mg into the skin every 30 (thirty) days. 1 Syringe 11  . furosemide (LASIX) 20 MG tablet Take 1-2 tablets (20-40 mg total) by mouth daily. 60 tablet 5  . insulin lispro (HUMALOG KWIKPEN) 100 UNIT/ML KiwkPen 4-5 units before dinner 9 mL 2  . L-Methylfolate (DEPLIN) 15 MG TABS Take 15 mg by mouth daily. For treatment  of depression    . linaclotide (LINZESS) 145 MCG CAPS capsule Take 1 capsule (145 mcg total) by mouth daily. 30 capsule 11  . Liniments (SALONPAS PAIN RELIEF PATCH EX) Apply 1 patch topically at bedtime.    Marland Kitchen LORazepam (ATIVAN) 1 MG tablet Take 1 mg by mouth 4 (four) times daily as needed for anxiety.   2  . lovastatin (MEVACOR) 20 MG tablet Take 20 mg by mouth every other day.    . Magnesium 500 MG CAPS Take 1 capsule by mouth daily.    . metFORMIN (GLUCOPHAGE-XR) 500 MG 24 hr tablet Take 2 tablets (1,000 mg total) by mouth 2 (two) times daily. For DM type 2 360 tablet 3  . Multiple Vitamins-Minerals (MULTIVITAMIN,TX-MINERALS) tablet Take 1 tablet by mouth daily.     . ondansetron (ZOFRAN) 4 MG tablet Take 4 mg by mouth every 8 (eight) hours as needed for nausea or vomiting.    Glory Rosebush DELICA LANCETS 27P MISC USE UTD UP TO QID 200 each 3  . ONETOUCH VERIO test strip Use to check blood sugars 5 times daily. 450 each 3  . pantoprazole (PROTONIX) 40 MG tablet Take 1 tablet (40 mg total) by mouth 2 (two) times daily. 180 tablet 3  . pioglitazone (ACTOS) 45 MG  tablet Take 1 tablet (45 mg total) by mouth daily. 90 tablet 3  . SUMAtriptan (IMITREX) 100 MG tablet Take 1 tablet (100 mg total) by mouth once as needed for migraine. May repeat in 2 hours if headache persists or recurs. 10 tablet 11  . Testosterone POWD APPLY A TINY PEA-SIZED AMOUNT ONCE A DAY. (Patient taking differently: APPLY A TINY PEA-SIZED AMOUNT ONCE A DAY.  4 percert) 30 g 5  . traMADol-acetaminophen (ULTRACET) 37.5-325 MG tablet TAKE 1 TABLET BY MOUTH EVERY 8 HOURS AS NEEDED 90 tablet 1  . TRINTELLIX 20 MG TABS Take 20 mg by mouth daily.  2  . VICTOZA 18 MG/3ML SOPN INJECT 1.2MG  INTO MUSCLE DAILY 12 pen 2  . VOLTAREN 1 % GEL APPLY 2 GRAMS TO KNEE 2-3 TIMES DAILY PRN  1  . Fremanezumab-vfrm (AJOVY) 225 MG/1.5ML SOSY Inject 225 mg into the skin every 30 (thirty) days. 2 Syringe 0   No facility-administered medications prior to  visit.     ROS Review of Systems  Constitutional: Positive for fatigue. Negative for activity change, appetite change, chills and unexpected weight change.  HENT: Negative for congestion, mouth sores and sinus pressure.   Eyes: Negative for visual disturbance.  Respiratory: Negative for cough and chest tightness.   Gastrointestinal: Positive for abdominal distention, abdominal pain, constipation and nausea.  Genitourinary: Negative for difficulty urinating, frequency and vaginal pain.  Musculoskeletal: Positive for arthralgias and back pain. Negative for gait problem.  Skin: Negative for pallor and rash.  Neurological: Negative for dizziness, tremors, weakness, numbness and headaches.  Psychiatric/Behavioral: Positive for decreased concentration and dysphoric mood. Negative for confusion and sleep disturbance. The patient is nervous/anxious.     Objective:  BP 132/78 (BP Location: Left Arm, Patient Position: Sitting, Cuff Size: Normal)   Pulse 100   Temp 98.2 F (36.8 C) (Oral)   Ht 5' 2.5" (1.588 m)   Wt 148 lb (67.1 kg)   SpO2 98%   BMI 26.64 kg/m   BP Readings from Last 3 Encounters:  12/02/17 132/78  11/10/17 118/76  10/06/17 126/82    Wt Readings from Last 3 Encounters:  12/02/17 148 lb (67.1 kg)  10/06/17 149 lb (67.6 kg)  10/01/17 150 lb 3.2 oz (68.1 kg)    Physical Exam  Constitutional: She appears well-developed. No distress.  HENT:  Head: Normocephalic.  Right Ear: External ear normal.  Left Ear: External ear normal.  Nose: Nose normal.  Mouth/Throat: Oropharynx is clear and moist.  Eyes: Pupils are equal, round, and reactive to light. Conjunctivae are normal. Right eye exhibits no discharge. Left eye exhibits no discharge.  Neck: Normal range of motion. Neck supple. No JVD present. No tracheal deviation present. No thyromegaly present.  Cardiovascular: Normal rate, regular rhythm and normal heart sounds.  Pulmonary/Chest: No stridor. No respiratory  distress. She has no wheezes.  Abdominal: Soft. Bowel sounds are normal. She exhibits no distension and no mass. There is no tenderness. There is no rebound and no guarding.  Musculoskeletal: She exhibits no edema or tenderness.  Lymphadenopathy:    She has no cervical adenopathy.  Neurological: She displays normal reflexes. No cranial nerve deficit. She exhibits normal muscle tone. Coordination normal.  Skin: No rash noted. No erythema.  Psychiatric: She has a normal mood and affect. Her behavior is normal. Judgment and thought content normal.    Lab Results  Component Value Date   WBC 7.2 06/11/2017   HGB 11.4 (L) 06/11/2017   HCT 33.5 (L)  06/11/2017   PLT 424 (H) 06/11/2017   GLUCOSE 123 (H) 09/29/2017   CHOL 129 09/29/2017   TRIG 58.0 09/29/2017   HDL 77.60 09/29/2017   LDLCALC 39 09/29/2017   ALT 14 03/10/2017   AST 20 03/10/2017   NA 135 09/29/2017   K 4.3 09/29/2017   CL 98 09/29/2017   CREATININE 0.97 09/29/2017   BUN 16 09/29/2017   CO2 29 09/29/2017   TSH 2.02 09/29/2017   INR 0.93 11/02/2013   HGBA1C 6.5 09/29/2017   MICROALBUR 1.8 09/29/2017    Mm Screening Breast Tomo Bilateral  Result Date: 05/27/2017 CLINICAL DATA:  Screening. EXAM: 2D DIGITAL SCREENING BILATERAL MAMMOGRAM WITH CAD AND ADJUNCT TOMO COMPARISON:  Previous exam(s). ACR Breast Density Category b: There are scattered areas of fibroglandular density. FINDINGS: There are no findings suspicious for malignancy. Images were processed with CAD. IMPRESSION: No mammographic evidence of malignancy. A result letter of this screening mammogram will be mailed directly to the patient. RECOMMENDATION: Screening mammogram in one year. (Code:SM-B-01Y) BI-RADS CATEGORY  1: Negative. Electronically Signed   By: Kristopher Oppenheim M.D.   On: 05/27/2017 12:30    Assessment & Plan:   There are no diagnoses linked to this encounter. I am having Niley L. Bastyr maintain her b complex vitamins, (multivitamin,tx-minerals),  cetirizine, DEPLIN, calcium-vitamin D, aspirin EC, VOLTAREN, LORazepam, busPIRone, Liniments (SALONPAS PAIN RELIEF PATCH EX), TRINTELLIX, Magnesium, linaclotide, SUMAtriptan, ondansetron, conjugated estrogens, Testosterone, pioglitazone, metFORMIN, ONETOUCH VERIO, ONETOUCH DELICA LANCETS 35A, BD PEN NEEDLE NANO U/F, cholecalciferol, lovastatin, VICTOZA, alendronate, insulin lispro, furosemide, estradiol, Brexpiprazole, pantoprazole, dicyclomine, traMADol-acetaminophen, and Fremanezumab-vfrm.  No orders of the defined types were placed in this encounter.    Follow-up: No follow-ups on file.  Walker Kehr, MD

## 2017-12-02 NOTE — Patient Instructions (Addendum)
Hold Lovastatin Reduce Victoza dose to 0.6 Gluten free diet Creon 1 capsule with the first bite 2-3 times a day

## 2017-12-02 NOTE — Assessment & Plan Note (Signed)
Linzess Hold Lovastatin Reduce Victoza dose to 0.6 Gluten free diet

## 2017-12-02 NOTE — Assessment & Plan Note (Signed)
Labs

## 2017-12-03 ENCOUNTER — Other Ambulatory Visit: Payer: Self-pay | Admitting: Internal Medicine

## 2017-12-03 ENCOUNTER — Other Ambulatory Visit: Payer: PPO

## 2017-12-03 DIAGNOSIS — R8290 Unspecified abnormal findings in urine: Secondary | ICD-10-CM

## 2017-12-03 MED ORDER — CIPROFLOXACIN HCL 250 MG PO TABS: 250.0000 mg | ORAL_TABLET | Freq: Two times a day (BID) | ORAL | 0 refills | Status: DC

## 2017-12-05 LAB — URINE CULTURE
MICRO NUMBER:: 90442438
SPECIMEN QUALITY:: ADEQUATE

## 2017-12-08 ENCOUNTER — Other Ambulatory Visit: Payer: Self-pay | Admitting: Internal Medicine

## 2017-12-09 ENCOUNTER — Encounter: Payer: Self-pay | Admitting: Internal Medicine

## 2017-12-17 ENCOUNTER — Ambulatory Visit: Payer: PPO | Admitting: Adult Health

## 2017-12-29 ENCOUNTER — Other Ambulatory Visit: Payer: Self-pay | Admitting: Endocrinology

## 2017-12-29 ENCOUNTER — Other Ambulatory Visit (INDEPENDENT_AMBULATORY_CARE_PROVIDER_SITE_OTHER): Payer: PPO

## 2017-12-29 DIAGNOSIS — E118 Type 2 diabetes mellitus with unspecified complications: Secondary | ICD-10-CM | POA: Diagnosis not present

## 2017-12-29 DIAGNOSIS — E1165 Type 2 diabetes mellitus with hyperglycemia: Secondary | ICD-10-CM

## 2017-12-29 DIAGNOSIS — Z794 Long term (current) use of insulin: Secondary | ICD-10-CM

## 2017-12-29 LAB — URINALYSIS, ROUTINE W REFLEX MICROSCOPIC
BILIRUBIN URINE: NEGATIVE
Hgb urine dipstick: NEGATIVE
KETONES UR: NEGATIVE
LEUKOCYTES UA: NEGATIVE
Nitrite: NEGATIVE
PH: 8.5 — AB (ref 5.0–8.0)
RBC / HPF: NONE SEEN (ref 0–?)
Specific Gravity, Urine: 1.01 (ref 1.000–1.030)
TOTAL PROTEIN, URINE-UPE24: NEGATIVE
UROBILINOGEN UA: 0.2 (ref 0.0–1.0)
Urine Glucose: NEGATIVE
WBC, UA: NONE SEEN (ref 0–?)

## 2017-12-29 LAB — BASIC METABOLIC PANEL
BUN: 14 mg/dL (ref 6–23)
CHLORIDE: 98 meq/L (ref 96–112)
CO2: 28 meq/L (ref 19–32)
Calcium: 9.6 mg/dL (ref 8.4–10.5)
Creatinine, Ser: 0.93 mg/dL (ref 0.40–1.20)
GFR: 62.24 mL/min (ref >60.00)
GLUCOSE: 127 mg/dL — AB (ref 70–99)
POTASSIUM: 4.5 meq/L (ref 3.5–5.1)
SODIUM: 134 meq/L — AB (ref 135–145)

## 2017-12-29 LAB — HEMOGLOBIN A1C: Hgb A1c MFr Bld: 6.6 % — ABNORMAL HIGH (ref 4.6–6.5)
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
## 2017-12-31 ENCOUNTER — Ambulatory Visit: Payer: PPO | Admitting: Endocrinology

## 2017-12-31 ENCOUNTER — Encounter: Payer: Self-pay | Admitting: Endocrinology

## 2017-12-31 VITALS — BP 138/80 | HR 94 | Ht 62.5 in | Wt 145.0 lb

## 2017-12-31 DIAGNOSIS — E1165 Type 2 diabetes mellitus with hyperglycemia: Secondary | ICD-10-CM | POA: Diagnosis not present

## 2017-12-31 MED ORDER — ACARBOSE 25 MG PO TABS: ORAL_TABLET | ORAL | 3 refills | Status: DC

## 2017-12-31 MED ORDER — METFORMIN HCL 1000 MG PO TABS: ORAL_TABLET | ORAL | 3 refills | Status: DC

## 2017-12-31 NOTE — Patient Instructions (Addendum)
Acarbose 1/2 at start of supper for 1 week and then 1 tab Call if having GI problems   Metformin 1/2 at Bf and 1 at dinner

## 2018-01-01 DIAGNOSIS — J301 Allergic rhinitis due to pollen: Secondary | ICD-10-CM | POA: Diagnosis not present

## 2018-01-01 DIAGNOSIS — J3081 Allergic rhinitis due to animal (cat) (dog) hair and dander: Secondary | ICD-10-CM | POA: Diagnosis not present

## 2018-01-01 DIAGNOSIS — K219 Gastro-esophageal reflux disease without esophagitis: Secondary | ICD-10-CM | POA: Diagnosis not present

## 2018-01-01 DIAGNOSIS — R21 Rash and other nonspecific skin eruption: Secondary | ICD-10-CM | POA: Diagnosis not present

## 2018-01-01 DIAGNOSIS — J3089 Other allergic rhinitis: Secondary | ICD-10-CM | POA: Diagnosis not present

## 2018-01-06 ENCOUNTER — Ambulatory Visit (INDEPENDENT_AMBULATORY_CARE_PROVIDER_SITE_OTHER): Payer: PPO | Admitting: Internal Medicine

## 2018-01-06 ENCOUNTER — Encounter: Payer: Self-pay | Admitting: Internal Medicine

## 2018-01-06 DIAGNOSIS — Z6830 Body mass index (BMI) 30.0-30.9, adult: Secondary | ICD-10-CM | POA: Diagnosis not present

## 2018-01-06 DIAGNOSIS — E118 Type 2 diabetes mellitus with unspecified complications: Secondary | ICD-10-CM

## 2018-01-06 DIAGNOSIS — I119 Hypertensive heart disease without heart failure: Secondary | ICD-10-CM | POA: Diagnosis not present

## 2018-01-06 DIAGNOSIS — D508 Other iron deficiency anemias: Secondary | ICD-10-CM | POA: Diagnosis not present

## 2018-01-06 DIAGNOSIS — K589 Irritable bowel syndrome without diarrhea: Secondary | ICD-10-CM

## 2018-01-06 DIAGNOSIS — R945 Abnormal results of liver function studies: Secondary | ICD-10-CM | POA: Diagnosis not present

## 2018-01-06 DIAGNOSIS — F411 Generalized anxiety disorder: Secondary | ICD-10-CM | POA: Diagnosis not present

## 2018-01-06 DIAGNOSIS — R1319 Other dysphagia: Secondary | ICD-10-CM

## 2018-01-06 DIAGNOSIS — E6609 Other obesity due to excess calories: Secondary | ICD-10-CM | POA: Diagnosis not present

## 2018-01-06 MED ORDER — LOVASTATIN 20 MG PO TABS: 20.0000 mg | ORAL_TABLET | Freq: Every day | ORAL | 3 refills | Status: DC

## 2018-01-06 MED ORDER — METFORMIN HCL 500 MG PO TABS: ORAL_TABLET | ORAL | 3 refills | Status: DC

## 2018-01-06 NOTE — Assessment & Plan Note (Signed)
Protonix Reduce Metformin size pills appt w/Dr Fuller Plan - pending

## 2018-01-06 NOTE — Assessment & Plan Note (Signed)
furosemide

## 2018-01-06 NOTE — Assessment & Plan Note (Signed)
Labs

## 2018-01-06 NOTE — Assessment & Plan Note (Signed)
Wt Readings from Last 3 Encounters:  01/06/18 144 lb (65.3 kg)  12/31/17 145 lb (65.8 kg)  12/02/17 148 lb (67.1 kg)

## 2018-01-06 NOTE — Progress Notes (Signed)
Subjective:  Patient ID: [name], female    DOB: 1941/04/09  Age: 77 y.o. MRN: [id number]  CC: No chief complaint on file.   HPI [name] presents for upset stomach. Creon did not help. Large pills stick in the esophagus x weeks  Outpatient Medications Prior to Visit  Medication Sig Dispense Refill  . acarbose (PRECOSE) 25 MG tablet 1 tablet at the start of supper daily 30 tablet 3  . aspirin EC 81 MG tablet Take 1 tablet (81 mg total) by mouth daily. 100 tablet 3  . b complex vitamins tablet Take 1 tablet by mouth daily. Vitamin supplement    . BD PEN NEEDLE NANO U/F 32G X 4 MM MISC USE AS DIRECTED TWICE DAILY. 200 each 3  . busPIRone (BUSPAR) 15 MG tablet Take 1 tablet by mouth 2 (two) times daily.    . calcium-vitamin D (OSCAL WITH D) 500-200 MG-UNIT per tablet Take 1 tablet by mouth daily with breakfast.    . cetirizine (ZYRTEC) 10 MG tablet Take 10 mg by mouth daily as needed for allergies.     . cholecalciferol (VITAMIN D) 1000 units tablet Take 2,000 Units by mouth daily.    Marland Kitchen conjugated estrogens (PREMARIN) vaginal cream Place 1 Applicatorful vaginally 2 (two) times a week. 42.5 g 4  . dicyclomine (BENTYL) 10 MG capsule TAKE 1 CAPSULE (10 MG TOTAL) BY MOUTH 3 (THREE) TIMES DAILY BEFORE MEALS. 270 capsule 1  . estradiol (CLIMARA - DOSED IN MG/24 HR) 0.025 mg/24hr patch Place 1 patch (0.025 mg total) once a week onto the skin. APPLY 1 PATCH ONTO SKIN ONCE WEEKLY 12 patch 2  . furosemide (LASIX) 20 MG tablet Take 1-2 tablets (20-40 mg total) by mouth daily. 60 tablet 5  . L-Methylfolate (DEPLIN) 15 MG TABS Take 15 mg by mouth daily. For treatment of depression    . linaclotide (LINZESS) 145 MCG CAPS capsule Take 1 capsule (145 mcg total) by mouth daily. 30 capsule 11  . Liniments (SALONPAS PAIN RELIEF PATCH EX) Apply 1 patch topically at bedtime.    Marland Kitchen LORazepam (ATIVAN) 1 MG tablet Take 1 mg by mouth 4 (four) times daily as needed for anxiety.   2  . lovastatin  (MEVACOR) 20 MG tablet Take 20 mg by mouth every other day.    . Magnesium 500 MG CAPS Take 1 capsule by mouth daily.    . metFORMIN (GLUCOPHAGE) 1000 MG tablet Half tablet at breakfast and 1 at dinnertime 45 tablet 3  . Multiple Vitamins-Minerals (MULTIVITAMIN,TX-MINERALS) tablet Take 1 tablet by mouth daily.     . ondansetron (ZOFRAN) 4 MG tablet Take 4 mg by mouth every 8 (eight) hours as needed for nausea or vomiting.    Glory Rosebush DELICA LANCETS 52W MISC USE UTD UP TO QID 200 each 3  . ONETOUCH VERIO test strip Use to check blood sugars 5 times daily. 450 each 3  . pantoprazole (PROTONIX) 40 MG tablet Take 1 tablet (40 mg total) by mouth 2 (two) times daily. 180 tablet 3  . pioglitazone (ACTOS) 45 MG tablet Take 1 tablet (45 mg total) by mouth daily. 90 tablet 3  . SUMAtriptan (IMITREX) 100 MG tablet Take 1 tablet (100 mg total) by mouth once as needed for migraine. May repeat in 2 hours if headache persists or recurs. 10 tablet 11  . Testosterone POWD APPLY A TINY PEA-SIZED AMOUNT ONCE A DAY. (Patient taking differently: APPLY A TINY PEA-SIZED AMOUNT ONCE A DAY.  4 percert) 30 g 5  . traMADol-acetaminophen (ULTRACET) 37.5-325 MG tablet TAKE 1 TABLET BY MOUTH EVERY 8 HOURS AS NEEDED 90 tablet 1  . TRINTELLIX 20 MG TABS Take 20 mg by mouth daily.  2  . VICTOZA 18 MG/3ML SOPN INJECT 1.2MG  INTO MUSCLE DAILY 12 pen 2  . VOLTAREN 1 % GEL APPLY 2 GRAMS TO KNEE 2-3 TIMES DAILY PRN  1  . Brexpiprazole (REXULTI) 1 MG TABS Take 1 mg by mouth. Every third day (Monday & Thursday)    . ciprofloxacin (CIPRO) 250 MG tablet Take 1 tablet (250 mg total) by mouth 2 (two) times daily. (Patient not taking: Reported on 01/06/2018) 14 tablet 0  . Fremanezumab-vfrm (AJOVY) 225 MG/1.5ML SOSY Inject 225 mg into the skin every 30 (thirty) days. (Patient not taking: Reported on 01/06/2018) 1 Syringe 11  . insulin lispro (HUMALOG KWIKPEN) 100 UNIT/ML KiwkPen 4-5 units before dinner (Patient not taking: Reported on  01/06/2018) 9 mL 2  . lipase/protease/amylase (CREON) 36000 UNITS CPEP capsule Take 1 capsule (36,000 Units total) by mouth 3 (three) times daily before meals. Take with the first bite of food (Patient not taking: Reported on 01/06/2018) 180 capsule 3   No facility-administered medications prior to visit.     ROS Review of Systems  Objective:  BP 128/72 (BP Location: Left Arm, Patient Position: Sitting, Cuff Size: Normal)   Pulse 94   Temp 98 F (36.7 C) (Oral)   Ht 5' 2.5" (1.588 m)   Wt 144 lb (65.3 kg)   SpO2 98%   BMI 25.92 kg/m   BP Readings from Last 3 Encounters:  01/06/18 128/72  12/31/17 138/80  12/02/17 132/78    Wt Readings from Last 3 Encounters:  01/06/18 144 lb (65.3 kg)  12/31/17 145 lb (65.8 kg)  12/02/17 148 lb (67.1 kg)    Physical Exam  Lab Results  Component Value Date   WBC 9.1 12/02/2017   HGB 11.4 (L) 12/02/2017   HCT 34.1 (L) 12/02/2017   PLT 405.0 (H) 12/02/2017   GLUCOSE 127 (H) 12/29/2017   CHOL 129 09/29/2017   TRIG 58.0 09/29/2017   HDL 77.60 09/29/2017   LDLCALC 39 09/29/2017   ALT 14 12/02/2017   AST 19 12/02/2017   NA 134 (L) 12/29/2017   K 4.5 12/29/2017   CL 98 12/29/2017   CREATININE 0.93 12/29/2017   BUN 14 12/29/2017   CO2 28 12/29/2017   TSH 1.29 12/02/2017   INR 0.93 11/02/2013   HGBA1C 6.6 (H) 12/29/2017   MICROALBUR 1.8 09/29/2017    Mm Screening Breast Tomo Bilateral  Result Date: 05/27/2017 CLINICAL DATA:  Screening. EXAM: 2D DIGITAL SCREENING BILATERAL MAMMOGRAM WITH CAD AND ADJUNCT TOMO COMPARISON:  Previous exam(s). ACR Breast Density Category b: There are scattered areas of fibroglandular density. FINDINGS: There are no findings suspicious for malignancy. Images were processed with CAD. IMPRESSION: No mammographic evidence of malignancy. A result letter of this screening mammogram will be mailed directly to the patient. RECOMMENDATION: Screening mammogram in one year. (Code:SM-B-01Y) BI-RADS CATEGORY  1:  Negative. Electronically Signed   By: Kristopher Oppenheim M.D.   On: 05/27/2017 12:30    Assessment & Plan:   There are no diagnoses linked to this encounter. I have discontinued Asli L. Boxer's insulin lispro, Fremanezumab-vfrm, lipase/protease/amylase, and ciprofloxacin. I am also having her maintain her b complex vitamins, (multivitamin,tx-minerals), cetirizine, DEPLIN, calcium-vitamin D, aspirin EC, VOLTAREN, LORazepam, busPIRone, Liniments (SALONPAS PAIN RELIEF PATCH EX), TRINTELLIX, Magnesium, linaclotide, SUMAtriptan, ondansetron,  conjugated estrogens, Testosterone, pioglitazone, ONETOUCH VERIO, ONETOUCH DELICA LANCETS 57Q, BD PEN NEEDLE NANO U/F, cholecalciferol, lovastatin, VICTOZA, furosemide, estradiol, Brexpiprazole, pantoprazole, dicyclomine, traMADol-acetaminophen, acarbose, and metFORMIN.  No orders of the defined types were placed in this encounter.    Follow-up: No follow-ups on file.  Walker Kehr, MD

## 2018-01-06 NOTE — Assessment & Plan Note (Signed)
F/u w/Dr Kaur ?

## 2018-01-06 NOTE — Assessment & Plan Note (Signed)
Victoza, Metformin, Lantus - d/c, Actos, Acarbose 

## 2018-01-06 NOTE — Assessment & Plan Note (Signed)
Monitoring CBC 

## 2018-01-06 NOTE — Assessment & Plan Note (Signed)
appt w/Dr Fuller Plan - pending

## 2018-01-13 ENCOUNTER — Encounter: Payer: Self-pay | Admitting: Physician Assistant

## 2018-01-13 ENCOUNTER — Encounter

## 2018-01-13 ENCOUNTER — Ambulatory Visit: Payer: PPO | Admitting: Physician Assistant

## 2018-01-13 VITALS — BP 116/76 | HR 100 | Ht 62.0 in | Wt 143.8 lb

## 2018-01-13 DIAGNOSIS — K219 Gastro-esophageal reflux disease without esophagitis: Secondary | ICD-10-CM | POA: Diagnosis not present

## 2018-01-13 DIAGNOSIS — R110 Nausea: Secondary | ICD-10-CM

## 2018-01-13 DIAGNOSIS — J301 Allergic rhinitis due to pollen: Secondary | ICD-10-CM | POA: Diagnosis not present

## 2018-01-13 DIAGNOSIS — R634 Abnormal weight loss: Secondary | ICD-10-CM | POA: Diagnosis not present

## 2018-01-13 DIAGNOSIS — R6881 Early satiety: Secondary | ICD-10-CM

## 2018-01-13 DIAGNOSIS — J3089 Other allergic rhinitis: Secondary | ICD-10-CM | POA: Diagnosis not present

## 2018-01-13 MED ORDER — ONDANSETRON HCL 4 MG PO TABS: ORAL_TABLET | ORAL | 3 refills | Status: DC

## 2018-01-13 NOTE — Patient Instructions (Addendum)
Take Zofran 4 mg every morning and evening. Stop the Linzess. Continue the Protonix 40 mg twice daily. Use Dicyclomine 10 mg only as needed for abdominal cramping.  You have been scheduled for a gastric emptying scan at [facility] on Thursday 6-13 at 7:30 am. Please arrive at least 15 minutes prior to your appointment for registration. Please make certain not to have anything to eat or drink after midnight the night before your test. Hold all stomach medications (ex: Zofran, Protonix ) 48 hours prior to your test. If you need to reschedule your appointment, please contact radiology scheduling at [phone]. _____________________________________________________________________ A gastric-emptying study measures how long it takes for food to move through your stomach. There are several ways to measure stomach emptying. In the most common test, you eat food that contains a small amount of radioactive material. A scanner that detects the movement of the radioactive material is placed over your abdomen to monitor the rate at which food leaves your stomach. This test normally takes about 4 hours to complete. _____________________________________________________________________  Dennis Bast have been scheduled for an endoscopy. Please follow written instructions given to you at your visit today. If you use inhalers (even only as needed), please bring them with you on the day of your procedure.   If you are age 77 or older, your body mass index should be between 23-30. Your Body mass index is 26.3 kg/m. If this is out of the aforementioned range listed, please consider follow up with your Primary Care Provider.

## 2018-01-13 NOTE — Progress Notes (Signed)
Subjective:    Patient ID: [name], female    DOB: Mar 04, 1941, 77 y.o.   MRN: [id number]  HPI Uriyah is a pleasant 77 year old white female, known to Dr. Fuller Plan and myself.  She was last seen here in the fall 2018.  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  She is status post splenectomy. Last EGD October 2014 with small hiatal hernia otherwise negative, colonoscopy in April 2017 was normal with no recurrent polyps.  She does have history of a sessile serrated polyp and is therefore indicated for follow-up in 2022. Patient comes in today with complaints of nausea and queasiness which have been present over the past 2 months on a daily basis.  She has been chronically on Protonix twice daily.  Also been having cramping intermittently and using Bentyl fairly regularly.  She and her husband do not feel that Linzess is working for her at this point Despite taking the 145 mcg Linzess she is also taking stool softeners on a daily basis and then Dulcolax tablets a couple of times a week in order to have a bowel movement. No current NSAID use. She says she is waking up almost every morning nauseated and queasy but not vomiting.  She is also nauseated throughout the day.  Her weight is down about 8 pounds.  She does have some indigestion symptoms and has been using Gaviscon.  Appetite has been decreased and she does have early satiety type symptoms.  She complains of some burning type discomfort in the epigastrium. Abdominal ultrasound was done in 2017 showed a 5.4 mm common bile duct and status post cholecystectomy. No new medications around the time of onset of symptoms.  She is on numerous medications.  Review of Systems Pertinent positive and negative review of systems were noted in the above HPI section.  All other review of systems was otherwise  negative.  Outpatient Encounter Medications as of 01/13/2018  Medication Sig  . acarbose (PRECOSE) 25 MG tablet 1 tablet at the start of supper daily  . aspirin EC 81 MG tablet Take 1 tablet (81 mg total) by mouth daily.  Marland Kitchen b complex vitamins tablet Take 1 tablet by mouth daily. Vitamin supplement  . BD PEN NEEDLE NANO U/F 32G X 4 MM MISC USE AS DIRECTED TWICE DAILY.  . Brexpiprazole (REXULTI) 1 MG TABS Take 1 mg by mouth. Every third day (Monday & Thursday)  . busPIRone (BUSPAR) 15 MG tablet Take 1 tablet by mouth 2 (two) times daily.  . calcium-vitamin D (OSCAL WITH D) 500-200 MG-UNIT per tablet Take 1 tablet by mouth daily with breakfast.  . cetirizine (ZYRTEC) 10 MG tablet Take 10 mg by mouth daily as needed for allergies.   . cholecalciferol (VITAMIN D) 1000 units tablet Take 2,000 Units by mouth daily.  Marland Kitchen conjugated estrogens (PREMARIN) vaginal cream Place 1 Applicatorful vaginally 2 (two) times a week.  . dicyclomine (BENTYL) 10 MG capsule TAKE 1 CAPSULE (10 MG TOTAL) BY MOUTH 3 (THREE) TIMES DAILY BEFORE MEALS.  Marland Kitchen estradiol (CLIMARA - DOSED IN MG/24 HR) 0.025 mg/24hr patch Place 1 patch (0.025 mg total) once a week onto the skin. APPLY 1 PATCH ONTO SKIN ONCE WEEKLY  . furosemide (LASIX) 20 MG tablet Take 1-2 tablets (20-40 mg total) by mouth daily.  Marland Kitchen L-Methylfolate (DEPLIN) 15 MG TABS Take 15 mg by mouth daily. For treatment of depression  .  linaclotide (LINZESS) 145 MCG CAPS capsule Take 1 capsule (145 mcg total) by mouth daily.  . Liniments (SALONPAS PAIN RELIEF PATCH EX) Apply 1 patch topically at bedtime.  Marland Kitchen LORazepam (ATIVAN) 1 MG tablet Take 1 mg by mouth 4 (four) times daily as needed for anxiety.   . lovastatin (MEVACOR) 20 MG tablet Take 1 tablet (20 mg total) by mouth at bedtime.  . Magnesium 500 MG CAPS Take 1 capsule by mouth daily.  . metFORMIN (GLUCOPHAGE) 500 MG tablet Take 1 in am and 2 with dinner (Patient taking differently: 500 mg. Take 1/2 in am and 1 with dinner)    . Multiple Vitamins-Minerals (MULTIVITAMIN,TX-MINERALS) tablet Take 1 tablet by mouth daily.   . ondansetron (ZOFRAN) 4 MG tablet Take 4 mg by mouth every 8 (eight) hours as needed for nausea or vomiting.  Glory Rosebush DELICA LANCETS 63K MISC USE UTD UP TO QID  . ONETOUCH VERIO test strip Use to check blood sugars 5 times daily.  . pantoprazole (PROTONIX) 40 MG tablet Take 1 tablet (40 mg total) by mouth 2 (two) times daily.  . pioglitazone (ACTOS) 45 MG tablet Take 1 tablet (45 mg total) by mouth daily.  . SUMAtriptan (IMITREX) 100 MG tablet Take 1 tablet (100 mg total) by mouth once as needed for migraine. May repeat in 2 hours if headache persists or recurs.  . Testosterone POWD APPLY A TINY PEA-SIZED AMOUNT ONCE A DAY. (Patient taking differently: APPLY A TINY PEA-SIZED AMOUNT ONCE A DAY.  4 percert)  . traMADol-acetaminophen (ULTRACET) 37.5-325 MG tablet TAKE 1 TABLET BY MOUTH EVERY 8 HOURS AS NEEDED  . TRINTELLIX 20 MG TABS Take 20 mg by mouth daily.  Marland Kitchen VICTOZA 18 MG/3ML SOPN INJECT 1.2MG  INTO MUSCLE DAILY  . VOLTAREN 1 % GEL APPLY 2 GRAMS TO KNEE 2-3 TIMES DAILY PRN  . ondansetron (ZOFRAN) 4 MG tablet Tke 1 tab by mouth every morning and evening.   No facility-administered encounter medications on file as of 01/13/2018.    Allergies  Allergen Reactions  . Amlodipine Other (See Comments)    Dizzy, headaches  . Celebrex [Celecoxib] Other (See Comments)    headaches  . Topamax [Topiramate] Other (See Comments)    hallucinations  . Codeine Nausea Only  . Gabapentin Other (See Comments)    dizzy  . Hydrocodone Nausea Only and Other (See Comments)    Feeling funny,   . Meloxicam Other (See Comments)    jittery  . Pravastatin Sodium Other (See Comments)    Muscle aches and pains   Patient Active Problem List   Diagnosis Date Noted  . Diabetic peripheral neuropathy associated with type 2 diabetes mellitus ([place]) 04/10/2017  . Nausea 03/10/2017  . IDA (iron deficiency anemia)  10/16/2016  . Anemia 09/04/2016  . Intractable chronic migraine without aura and with status migrainosus 08/12/2016  . CAP (community acquired pneumonia) 05/28/2016  . Acute confusional state 05/16/2016  . Chronic migraine w/o aura w/o status migrainosus, not intractable 04/13/2016  . OSA on CPAP 01/30/2016  . Acute URI 10/17/2015  . Rash 07/12/2015  . SI (sacroiliac) pain 03/30/2015  . Sciatica of right side 08/08/2014  . Acute blood loss anemia 03/09/2014  . DJD (degenerative joint disease) of knee 11/10/2013  . Constipation 10/29/2013  . Obesity 08/04/2013  . Osteoarthritis of right knee 08/04/2013  . Edema 05/11/2013  . Fall 09/16/2012  . Neck pain 09/16/2012  . Low back pain 09/16/2012  . Well adult exam 07/01/2012  .  Cerumen impaction 08/07/2011  . Acute bronchitis 05/21/2011  . Fatigue 08/07/2010  . PARESTHESIA 08/07/2010  . DYSPHAGIA 07/13/2009  . Irritable bowel syndrome 09/24/2007  . LIVER FUNCTION TESTS, ABNORMAL 09/24/2007  . DM type 2, controlled, with complication ([place]) 38/46/6599  . Anxiety state 06/26/2007  . Depression 06/26/2007  . 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 heart failure 06/26/2007  . GERD 06/26/2007  . OSTEOARTHRITIS 06/26/2007  . COLONIC POLYPS, HX OF 06/26/2007  . SPLENECTOMY, TOTAL, HX OF 06/26/2007   Social History   Socioeconomic History  . Marital status: Married    Spouse name: Patrick Jupiter  . Number of children: 3  . Years of education: 16  . Highest education level: Not on file  Occupational History  . Occupation: Retired    Fish farm manager: RETIRED  Social Needs  . Financial resource strain: Not on file  . Food insecurity:    Worry: Not on file    Inability: Not on file  . Transportation needs:    Medical: Not on file    Non-medical: Not on file  Tobacco Use  . Smoking status: Never Smoker  . Smokeless tobacco: Never Used  Substance and Sexual Activity  . Alcohol use: No    Alcohol/week: 0.0 oz  . Drug use: No  . Sexual  activity: Yes    Birth control/protection: Surgical, Post-menopausal    Comment: HYST, INTERCOURSE AGE 31, SEXUAL PARTNERS LESS THAN 5  Lifestyle  . Physical activity:    Days per week: Not on file    Minutes per session: Not on file  . Stress: Not on file  Relationships  . Social connections:    Talks on phone: Not on file    Gets together: Not on file    Attends religious service: Not on file    Active member of club or organization: Not on file    Attends meetings of clubs or organizations: Not on file    Relationship status: Not on file  . Intimate partner violence:    Fear of current or ex partner: Not on file    Emotionally abused: Not on file    Physically abused: Not on file    Forced sexual activity: Not on file  Other Topics Concern  . Not on file  Social History Narrative   Daily Caffeine Use:  Rarely   Regular Exercise -  No - [facility] [employer]   Married with 3 sons       Ms. Lebeda's family history includes Breast cancer in her other; Breast cancer (age of onset: 71) in her sister; Colon cancer in her son; Colon cancer (age of onset: 52) in her mother; Colon polyps in her mother; Diabetes in her father.      Objective:    Vitals:   01/13/18 1059  BP: 116/76  Pulse: 100    Physical Exam; well-developed elderly white female in no acute distress, accompanied by her husband both pleasant.  Blood pressure 116/76, pulse 100, height 5 foot 2, weight 143, BMI 26.3.  HEENT; nontraumatic normocephalic EOMI PERRLA sclera anicteric, Oropharynx clear, Cardiovascular; regular rate and rhythm with S1-S2 no murmur rub or gallop, Pulmonary; clear bilaterally, Abdomen ;soft, nondistended bowel sounds are present no definite succussion splash no palpable mass or hepatosplenomegaly she has some mild tenderness across the epigastrium.  Rectal; exam not done, Ext; no clubbing cyanosis or edema skin warm and dry, Neuro psych ;alert and oriented, grossly nonfocal mood  and affect appropriate  Assessment & Plan:   #61 77 year old white female diabetic with 46-month history of daily nausea and queasiness without vomiting, early satiety, decrease in appetite and weight loss of about 8 pounds. Symptoms are despite twice daily PPI therapy. I suspect she may have gastroparesis contributing to her current symptoms.  Doubt peptic ulcer disease but cannot rule out.  Rule out occult malignancy.  #2 history of sessile serrated colon polyp-due for follow-up 2022 #3 chronic GERD #4.  History of IBS with chronic constipation-Linzess no longer effective #5 migraine headaches #6.  Arthritis #7.  Status post cholecystectomy and splenectomy #8.  Obstructive sleep apnea  Plan; continue Protonix 40 mg p.o. twice daily Stop Linzess Patient advised to use Bentyl only on a as needed basis. Start step 3 gastroparesis diet Start Zofran 4 mg every morning on arising and then mid day Schedule for gastric emptying scan. We will also schedule for upper endoscopy with Dr. Fuller Plan.  Procedure was discussed in detail with the patient including indications risks and benefits and she is agreeable to proceed.   Maclain Cohron S Rodel Glaspy PA-C 01/13/2018   Cc: Plotnikov, Evie Lacks, MD

## 2018-01-14 NOTE — Progress Notes (Signed)
Reviewed and agree with initial management plan.  Bich Mchaney T. Sherod Cisse, MD FACG 

## 2018-01-22 IMAGING — NM NM BONE 3 PHASE
3 series · 13 of 13 positions shown · non-contrast
Comparison: None.

CLINICAL DATA: Evaluate for loosening of right total knee
arthroplasty. Knee replacement 2 years ago.

EXAM:
NUCLEAR MEDICINE 3-PHASE BONE SCAN
TECHNIQUE: Radionuclide angiographic images, immediate static blood pool
images, and 3-hour delayed static images were obtained of the knees
after intravenous injection of radiopharmaceutical.
RADIOPHARMACEUTICALS:  26.1 mCi Uc-QQm MDP

[Series 1: flow · 4.14mm/px · 6 of 40 frames shown (1 of 2)]
[frame 4/40]
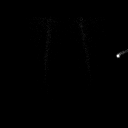
[frame 10/40]
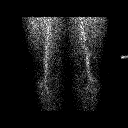
[frame 17/40]
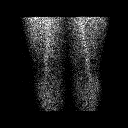
[frame 24/40]
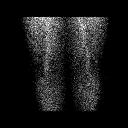
[frame 30/40]
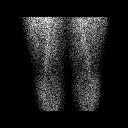
[frame 37/40]
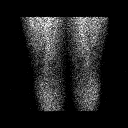

[Series 1: flow · 4.14mm/px · 6 of 40 frames shown (2 of 2)]
[frame 4/40]
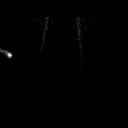
[frame 10/40]
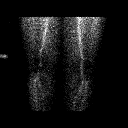
[frame 17/40]
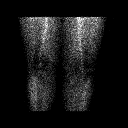
[frame 24/40]
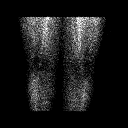
[frame 30/40]
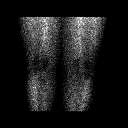
[frame 37/40]
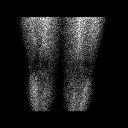

[Series 5: delays · 2.07mm/px · 1 of 1 slices shown]
[im 1/1]
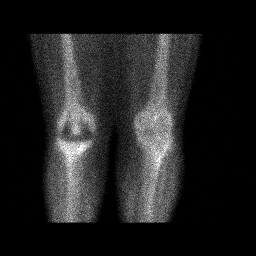

[13 of 13 positions shown; findings below may reference images not displayed]

FINDINGS: Vascular phase: No abnormal asymmetric increased uptake identified.

Blood pool phase: No abnormal asymmetric increased uptake
identified. Photopenia corresponds to the patient's right knee
arthroplasty device.

Delayed phase: Mild nonspecific increased uptake surrounding the
tibial components of the right knee arthroplasty device identified.
IMPRESSION: 1. No specific findings identified to suggest arthroplasty device
loosening or infection.
2. Nonspecific delayed phase uptake surrounds the tibial component
of the right knee arthroplasty device.

## 2018-01-28 ENCOUNTER — Encounter (HOSPITAL_COMMUNITY): Payer: PPO

## 2018-02-02 ENCOUNTER — Encounter: Payer: Self-pay | Admitting: Physician Assistant

## 2018-02-03 ENCOUNTER — Other Ambulatory Visit: Payer: Self-pay | Admitting: Women's Health

## 2018-02-03 NOTE — Telephone Encounter (Signed)
Okay for refill?  

## 2018-02-03 NOTE — Telephone Encounter (Signed)
Rx called in 

## 2018-02-04 ENCOUNTER — Encounter: Payer: Self-pay | Admitting: Gastroenterology

## 2018-02-05 ENCOUNTER — Ambulatory Visit (HOSPITAL_COMMUNITY)
Admission: RE | Admit: 2018-02-05 | Discharge: 2018-02-05 | Disposition: A | Discharge status: Home / Self Care | Payer: PPO | Source: Ambulatory Visit | Attending: Physician Assistant | Admitting: Physician Assistant

## 2018-02-05 DIAGNOSIS — R112 Nausea with vomiting, unspecified: Secondary | ICD-10-CM | POA: Diagnosis not present

## 2018-02-05 DIAGNOSIS — R110 Nausea: Secondary | ICD-10-CM | POA: Diagnosis not present

## 2018-02-05 DIAGNOSIS — G4733 Obstructive sleep apnea (adult) (pediatric): Secondary | ICD-10-CM | POA: Diagnosis not present

## 2018-02-05 DIAGNOSIS — R634 Abnormal weight loss: Secondary | ICD-10-CM

## 2018-02-05 DIAGNOSIS — K219 Gastro-esophageal reflux disease without esophagitis: Secondary | ICD-10-CM

## 2018-02-05 DIAGNOSIS — R6881 Early satiety: Secondary | ICD-10-CM | POA: Diagnosis not present

## 2018-02-05 MED ORDER — TECHNETIUM TC 99M SULFUR COLLOID: 2.1000 | Freq: Once | INTRAVENOUS | Status: DC

## 2018-02-06 ENCOUNTER — Other Ambulatory Visit: Payer: Self-pay | Admitting: Internal Medicine

## 2018-02-17 ENCOUNTER — Ambulatory Visit (AMBULATORY_SURGERY_CENTER): Payer: PPO | Admitting: Gastroenterology

## 2018-02-17 ENCOUNTER — Other Ambulatory Visit: Payer: Self-pay

## 2018-02-17 ENCOUNTER — Encounter: Payer: Self-pay | Admitting: Gastroenterology

## 2018-02-17 ENCOUNTER — Telehealth: Payer: Self-pay | Admitting: Neurology

## 2018-02-17 VITALS — BP 153/81 | HR 86 | Temp 98.7°F | Resp 10 | Ht 62.0 in | Wt 143.0 lb

## 2018-02-17 DIAGNOSIS — K219 Gastro-esophageal reflux disease without esophagitis: Secondary | ICD-10-CM

## 2018-02-17 DIAGNOSIS — R634 Abnormal weight loss: Secondary | ICD-10-CM | POA: Diagnosis not present

## 2018-02-17 DIAGNOSIS — R6881 Early satiety: Secondary | ICD-10-CM | POA: Diagnosis not present

## 2018-02-17 DIAGNOSIS — E119 Type 2 diabetes mellitus without complications: Secondary | ICD-10-CM | POA: Diagnosis not present

## 2018-02-17 DIAGNOSIS — G4733 Obstructive sleep apnea (adult) (pediatric): Secondary | ICD-10-CM | POA: Diagnosis not present

## 2018-02-17 DIAGNOSIS — I10 Essential (primary) hypertension: Secondary | ICD-10-CM | POA: Diagnosis not present

## 2018-02-17 MED ORDER — RANITIDINE HCL 150 MG PO TABS: 150.0000 mg | ORAL_TABLET | Freq: Every day | ORAL | 11 refills | Status: DC

## 2018-02-17 MED ORDER — PLECANATIDE 3 MG PO TABS
3.0000 mg | ORAL_TABLET | Freq: Every day | ORAL | 11 refills | Status: DC
Start: 2018-02-17 — End: 2018-02-25

## 2018-02-17 MED ORDER — SODIUM CHLORIDE 0.9 % IV SOLN: 500.0000 mL | Freq: Once | INTRAVENOUS | Status: DC

## 2018-02-17 NOTE — Progress Notes (Signed)
Report to PACU, RN, vss, BBS= Clear.  

## 2018-02-17 NOTE — Op Note (Signed)
[facility] Patient Name: [name] Procedure Date: 02/17/2018 9:57 AM MRN: [id number] Endoscopist: Ladene Artist , MD Age: 77 Referring MD:  Date of Birth: 1940/10/07 Gender: Female Account #: [id number] Procedure:                Upper GI endoscopy Indications:              Gastroesophageal reflux disease, Early satiety,                            Nausea Medicines:                Monitored Anesthesia Care Procedure:                Pre-Anesthesia Assessment:                           - Prior to the procedure, a History and Physical                            was performed, and patient medications and                            allergies were reviewed. The patient's tolerance of                            previous anesthesia was also reviewed. The risks                            and benefits of the procedure and the sedation                            options and risks were discussed with the patient.                            All questions were answered, and informed consent                            was obtained. Prior Anticoagulants: The patient has                            taken no previous anticoagulant or antiplatelet                            agents. ASA Grade Assessment: II - A patient with                            mild systemic disease. After reviewing the risks                            and benefits, the patient was deemed in                            satisfactory condition to undergo the procedure.  After obtaining informed consent, the endoscope was                            passed under direct vision. Throughout the                            procedure, the patient's blood pressure, pulse, and                            oxygen saturations were monitored continuously. The                            Model GIF-HQ190 [id number]) scope was introduced                            through the mouth, and advanced to the second  part                            of duodenum. The upper GI endoscopy was                            accomplished without difficulty. The patient                            tolerated the procedure well. Scope In: Scope Out: Findings:                 The examined esophagus was normal.                           A medium-sized hiatal hernia was present.                           The exam of the stomach was otherwise normal.                           The duodenal bulb and second portion of the                            duodenum were normal. Complications:            No immediate complications. Estimated Blood Loss:     Estimated blood loss: none. Impression:               - Normal esophagus.                           - Medium-sized hiatal hernia.                           - Normal duodenal bulb and second portion of the                            duodenum.                           - No specimens collected. Recommendation:           -  Patient has a contact number available for                            emergencies. The signs and symptoms of potential                            delayed complications were discussed with the                            patient. Return to normal activities tomorrow.                            Written discharge instructions were provided to the                            patient.                           - Resume previous diet.                           - Closely follow antireflux measures including                            elevating HOB 4 inches.                           - Ranitidine 300 mg po hs, 1 year of refills.                           - Trulance 3 mg po qd, 1 year of refills.                           - Continue present medications.                           - Return to GI office in 1 month with me or Amy                            [place], PAC. Ladene Artist, MD 02/17/2018 10:17:18 AM This report has been signed electronically.

## 2018-02-17 NOTE — Telephone Encounter (Signed)
I called and requested a fax form for authorization of the Botox from the patients insurance.

## 2018-02-17 NOTE — Patient Instructions (Signed)
Discharge instructions given. Handout on a hiatal hernia. Resume previous medications. Prescriptions sent to pharmacy. YOU HAD AN ENDOSCOPIC PROCEDURE TODAY AT [facility]:   Refer to the procedure report that was given to you for any specific questions about what was found during the examination.  If the procedure report does not answer your questions, please call your gastroenterologist to clarify.  If you requested that your care partner not be given the details of your procedure findings, then the procedure report has been included in a sealed envelope for you to review at your convenience later.  YOU SHOULD EXPECT: Some feelings of bloating in the abdomen. Passage of more gas than usual.  Walking can help get rid of the air that was put into your GI tract during the procedure and reduce the bloating. If you had a lower endoscopy (such as a colonoscopy or flexible sigmoidoscopy) you may notice spotting of blood in your stool or on the toilet paper. If you underwent a bowel prep for your procedure, you may not have a normal bowel movement for a few days.  Please Note:  You might notice some irritation and congestion in your nose or some drainage.  This is from the oxygen used during your procedure.  There is no need for concern and it should clear up in a day or so.  SYMPTOMS TO REPORT IMMEDIATELY:   Following upper endoscopy (EGD)  Vomiting of blood or coffee ground material  New chest pain or pain under the shoulder blades  Painful or persistently difficult swallowing  New shortness of breath  Fever of 100F or higher  Black, tarry-looking stools  For urgent or emergent issues, a gastroenterologist can be reached at any hour by calling [phone].   DIET:  We do recommend a small meal at first, but then you may proceed to your regular diet.  Drink plenty of fluids but you should avoid alcoholic beverages for 24 hours.  ACTIVITY:  You should plan to take it easy for  the rest of today and you should NOT DRIVE or use heavy machinery until tomorrow (because of the sedation medicines used during the test).    FOLLOW UP: Our staff will call the number listed on your records the next business day following your procedure to check on you and address any questions or concerns that you may have regarding the information given to you following your procedure. If we do not reach you, we will leave a message.  However, if you are feeling well and you are not experiencing any problems, there is no need to return our call.  We will assume that you have returned to your regular daily activities without incident.  If any biopsies were taken you will be contacted by phone or by letter within the next 1-3 weeks.  Please call us at [phone] if you have not heard about the biopsies in 3 weeks.    SIGNATURES/CONFIDENTIALITY: You and/or your care partner have signed paperwork which will be entered into your electronic medical record.  These signatures attest to the fact that that the information above on your After Visit Summary has been reviewed and is understood.  Full responsibility of the confidentiality of this discharge information lies with you and/or your care-partner.

## 2018-02-18 ENCOUNTER — Telehealth: Payer: Self-pay

## 2018-02-18 ENCOUNTER — Ambulatory Visit: Payer: PPO | Admitting: Gastroenterology

## 2018-02-18 NOTE — Telephone Encounter (Signed)
  Follow up Call-  Call back number 02/17/2018 12/05/2015  Post procedure Call Back phone  # [phone]  Permission to leave phone message Yes Yes  Some recent data might be hidden     No ID on answering machine.  No message left.  Will try again this afternoon. Angela/call-back [facility]

## 2018-02-18 NOTE — Telephone Encounter (Signed)
  Follow up Call-  Call back number 02/17/2018 12/05/2015  Post procedure Call Back phone  # [phone]  Permission to leave phone message Yes Yes  Some recent data might be hidden     Patient questions:  Do you have a fever, pain , or abdominal swelling? No. Pain Score  0 *  Have you tolerated food without any problems? Yes.    Have you been able to return to your normal activities? Yes.    Do you have any questions about your discharge instructions: Diet   No. Medications  No. Follow up visit  No.  Do you have questions or concerns about your Care? No.  Actions: * If pain score is 4 or above: No action needed, pain <4.

## 2018-02-18 NOTE — Telephone Encounter (Signed)
Clinicals faxed to [facility] [employer].

## 2018-02-19 ENCOUNTER — Encounter: Payer: Self-pay | Admitting: Gastroenterology

## 2018-02-20 ENCOUNTER — Telehealth: Payer: Self-pay

## 2018-02-20 NOTE — Telephone Encounter (Signed)
Amitiza 24 mcg po bid with food

## 2018-02-20 NOTE — Telephone Encounter (Signed)
Dr. Fuller Plan, Trulance is not covered by patient's insurance plan. Can we her switch to another medication?

## 2018-02-23 NOTE — Telephone Encounter (Signed)
Left a message for patient to return my call. 

## 2018-02-24 NOTE — Telephone Encounter (Signed)
Left a message for patient to return my call. 

## 2018-02-25 MED ORDER — LUBIPROSTONE 24 MCG PO CAPS: 24.0000 ug | ORAL_CAPSULE | Freq: Two times a day (BID) | ORAL | 11 refills | Status: DC

## 2018-02-25 NOTE — Telephone Encounter (Signed)
My chart message sent to patient since she initially messaged me via my chart message. Also since we have been unable to reach patient by telephone. Prescription for Amitiza 24 mcg sent to patient's pharmacy.

## 2018-02-25 NOTE — Telephone Encounter (Addendum)
I called to check status at [phone], I spoke with [facility] who stated it was still pending. Auth# is [id number]

## 2018-03-02 NOTE — Telephone Encounter (Signed)
I called to make the patient aware her Josem Kaufmann was still pending but she did not answer.

## 2018-03-03 ENCOUNTER — Telehealth: Payer: Self-pay | Admitting: Neurology

## 2018-03-03 ENCOUNTER — Ambulatory Visit: Payer: Self-pay

## 2018-03-03 ENCOUNTER — Ambulatory Visit: Payer: PPO | Admitting: Neurology

## 2018-03-03 NOTE — Telephone Encounter (Signed)
Incoming call from patient's husband stating that he and [place] on [place].  Patient stated that she was dizzy and nauseated.blood sugar was 254. Ate  a small meal. After eating became more dizzy.  At 9pm.  Blood sugar was 169. husband states that wife has a hx of vertigo years ago. At breakfast this am, blood sugar was 295.  At 1040 it was 225. Ate raisen bread toast. Took prescribed medication zophran. Husband also reports patient has a history of headaches and take zophran injections in the head q 3 months. Husband cancelled appointment for today. Husband states that she has to hold his wife up when walking to the bath room. Patient going on 24 hour that she has been dizzy.  Patient has been resting and sleeping most of the day.  Moving around makes it worse. Neither one has no idea what may be causing the dizziness. Denies headache , earache ,numbness . Reviewed protocol with husband, recommended per protocol that patient be evaluated at ER or Urgent Care.  Husband stated that he would assess the situation when his wife woke up from a nap that she was currently taking. Reviewed care advice with him for patient.    Reason for Disposition . SEVERE dizziness (vertigo) (e.g., unable to walk without assistance) . [1] Dizziness (vertigo) present now AND [2] one or more stroke risk factors (i.e., hypertension, diabetes, prior stroke/TIA/heart attack)  (Exception: prior physician evaluation for this AND no different/worse than usual)  Answer Assessment - Initial Assessment Questions 1. DESCRIPTION: "Describe your dizziness."     See notes 2. VERTIGO: "Do you feel like either you or the room is spinning or tilting?"       cant walk by herself 3. LIGHTHEADED: "Do you feel lightheaded?" (e.g., somewhat faint, woozy, weak upon standing)    Yes  4. SEVERITY: "How bad is it?"  "Can you walk?"   - MILD - Feels unsteady but walking normally.   - MODERATE - Feels very  unsteady when walking, but not falling; interferes with normal activities (e.g., school, work) .   - SEVERE - Unable to walk without falling (requires assistance).     Needs help when walking 5. ONSET:  "When did the dizziness begin?"    yesterday 6. AGGRAVATING FACTORS: "Does anything make it worse?" (e.g., standing, change in head position)     Moving around 7. CAUSE: "What do you think is causing the dizziness?"    No 8. RECURRENT SYMPTOM: "Have you had dizziness before?" If so, ask: "When was the last time?"    "What happened that time?"    See.  9. OTHER SYMPTOMS: "Do you have any other symptoms?" (e.g., headache, weakness, numbness, vomiting, earache)    no  Protocols used: DIZZINESS - VERTIGO-A-AH

## 2018-03-03 NOTE — Telephone Encounter (Signed)
[name] [name]/DPR on the way home from the mountains stopped at [facility] to eat about 5:30 pm. [name] she was not feeling well, he took her sugar level, it was 254. They went into eat but upon coming out she became dizzy. He checked her sugar again at 9pm it was 169. He said she has been in the bed since coming home and is still dizzy. Please call to advise.

## 2018-03-03 NOTE — Telephone Encounter (Signed)
It is unclear if patient was taken off of insulin or not per Dr Ronnie Derby notes. She was on Victoza and Humolog.

## 2018-03-03 NOTE — Telephone Encounter (Signed)
[id number] and [id number] (1 visit 05/19/18)

## 2018-03-03 NOTE — Telephone Encounter (Signed)
Spoke with pt. She stated that she has had vertigo before. She is so dizzy when she tries to sit up and also says she is a little nauseated and off balance because of the dizziness. She took a Zofran. She said as long as she is laying down in bed she does not feel the dizziness. She said this is the same she has felt in the past, same dizziness. She denies any vision problems or any other neurological symptoms. Her blood sugar has not been taken yet this morning. RN advised her to go ahead and recheck her blood sugar.  Per Dr. Jaynee Eagles, cannot rule out stroke, possibly benign positional vertigo. RN advised pt that we cannot rule out stroke vs the vertigo she has had before so we advise her to go to the ED or call 911 if this is different than the past or if she is having any neurological changes (including numbness/weakness/facial droop/speech problems/worsened balance, etc) or if the dizziness does not get better. Pt verbalized understanding and said this is the same as in the past and she feels that the rest will be the best and will try not to get up anymore than she has to. She wanted to cancel her appt for botox today 3:30. RN had also offered for Dr. Jaynee Eagles to evaluate her today instead of the botox if she preferred. Pt did not even want to come for evaluation and wanted to continue resting. She will call back if she is not better tomorrow. She was also advised that she can call PCP as well. Pt very appreciative.

## 2018-03-04 NOTE — Telephone Encounter (Signed)
FYI  Have not received a call back

## 2018-03-04 NOTE — Telephone Encounter (Signed)
OV w/us or endocrinology if not better. Pls bring all meds Thx

## 2018-03-05 NOTE — Telephone Encounter (Signed)
LMTCB

## 2018-03-11 DIAGNOSIS — H35033 Hypertensive retinopathy, bilateral: Secondary | ICD-10-CM | POA: Diagnosis not present

## 2018-03-11 DIAGNOSIS — Z961 Presence of intraocular lens: Secondary | ICD-10-CM | POA: Diagnosis not present

## 2018-03-11 DIAGNOSIS — H3509 Other intraretinal microvascular abnormalities: Secondary | ICD-10-CM | POA: Diagnosis not present

## 2018-03-11 DIAGNOSIS — E119 Type 2 diabetes mellitus without complications: Secondary | ICD-10-CM | POA: Diagnosis not present

## 2018-03-11 DIAGNOSIS — H43811 Vitreous degeneration, right eye: Secondary | ICD-10-CM | POA: Diagnosis not present

## 2018-03-11 LAB — HM DIABETES EYE EXAM

## 2018-03-13 ENCOUNTER — Telehealth: Payer: Self-pay | Admitting: Internal Medicine

## 2018-03-13 ENCOUNTER — Ambulatory Visit (INDEPENDENT_AMBULATORY_CARE_PROVIDER_SITE_OTHER): Payer: PPO | Admitting: Neurology

## 2018-03-13 DIAGNOSIS — R42 Dizziness and giddiness: Secondary | ICD-10-CM | POA: Diagnosis not present

## 2018-03-13 DIAGNOSIS — G43709 Chronic migraine without aura, not intractable, without status migrainosus: Secondary | ICD-10-CM | POA: Diagnosis not present

## 2018-03-13 DIAGNOSIS — R29818 Other symptoms and signs involving the nervous system: Secondary | ICD-10-CM

## 2018-03-13 DIAGNOSIS — H8149 Vertigo of central origin, unspecified ear: Secondary | ICD-10-CM

## 2018-03-13 DIAGNOSIS — I639 Cerebral infarction, unspecified: Secondary | ICD-10-CM | POA: Diagnosis not present

## 2018-03-13 DIAGNOSIS — H814 Vertigo of central origin: Secondary | ICD-10-CM

## 2018-03-13 MED ORDER — TRAMADOL-ACETAMINOPHEN 37.5-325 MG PO TABS: 1.0000 | ORAL_TABLET | Freq: Three times a day (TID) | ORAL | 3 refills | Status: DC | PRN
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
## 2018-03-13 NOTE — Progress Notes (Signed)
Botox- 100 units x 2 vials Lot: [id number] Expiration: 09/2020 NDC: 1901-2224-11  Bacteriostatic 0.9% Sodium Chloride- 43mL total Lot: [id number] Expiration: 12/25/2018 NDC: 2767-0110-03  Dx: E96.116 B/B

## 2018-03-13 NOTE — Progress Notes (Signed)
Consent Form Botulism Toxin Injection For Chronic Migraine  + eyes, +LS, not masseters, + temples  Reviewed orally with patient, additionally signature is on file:  Botulism toxin has been approved by the Federal drug administration for treatment of chronic migraine. Botulism toxin does not cure chronic migraine and it may not be effective in some patients.  The administration of botulism toxin is accomplished by injecting a small amount of toxin into the muscles of the neck and head. Dosage must be titrated for each individual. Any benefits resulting from botulism toxin tend to wear off after 3 months with a repeat injection required if benefit is to be maintained. Injections are usually done every 3-4 months with maximum effect peak achieved by about 2 or 3 weeks. Botulism toxin is expensive and you should be sure of what costs you will incur resulting from the injection.  The side effects of botulism toxin use for chronic migraine may include:   -Transient, and usually mild, facial weakness with facial injections  -Transient, and usually mild, head or neck weakness with head/neck injections  -Reduction or loss of forehead facial animation due to forehead muscle weakness  -Eyelid drooping  -Dry eye  -Pain at the site of injection or bruising at the site of injection  -Double vision  -Potential unknown long term risks  Contraindications: You should not have Botox if you are pregnant, nursing, allergic to albumin, have an infection, skin condition, or muscle weakness at the site of the injection, or have myasthenia gravis, Lambert-Eaton syndrome, or ALS.  It is also possible that as with any injection, there may be an allergic reaction or no effect from the medication. Reduced effectiveness after repeated injections is sometimes seen and rarely infection at the injection site may occur. All care will be taken to prevent these side effects. If therapy is given over a long time, atrophy and  wasting in the muscle injected may occur. Occasionally the patient's become refractory to treatment because they develop antibodies to the toxin. In this event, therapy needs to be modified.  I have read the above information and consent to the administration of botulism toxin.    BOTOX PROCEDURE NOTE FOR MIGRAINE HEADACHE    Contraindications and precautions discussed with patient(above). Aseptic procedure was observed and patient tolerated procedure. Procedure performed by Dr. Georgia Dom  The condition has existed for more than 6 months, and pt does not have a diagnosis of ALS, Myasthenia Gravis or Lambert-Eaton Syndrome.  Risks and benefits of injections discussed and pt agrees to proceed with the procedure.  Written consent obtained  These injections are medically necessary. Pt  receives good benefits from these injections. These injections do not cause sedations or hallucinations which the oral therapies may cause.  Indication/Diagnosis: chronic migraine BOTOX(J0585) injection was performed according to protocol by [employer]. 200 units of BOTOX was dissolved into 4 cc NS.   NDC: [address]-90   Description of procedure:  The patient was placed in a sitting position. The standard protocol was used for Botox as follows, with 5 units of Botox injected at each site:   -Procerus muscle, midline injection  -Corrugator muscle, bilateral injection  -Frontalis muscle, bilateral injection, with 2 sites each side, medial injection was performed in the upper one third of the frontalis muscle, in the region vertical from the medial inferior edge of the superior orbital rim. The lateral injection was again in the upper one third of the forehead vertically above the lateral limbus of the cornea,  1.5 cm lateral to the medial injection site.  -Temporalis muscle injection, 4 sites, bilaterally. The first injection was 3 cm above the tragus of the ear, second injection site was 1.5 cm to 3 cm up  from the first injection site in line with the tragus of the ear. The third injection site was 1.5-3 cm forward between the first 2 injection sites. The fourth injection site was 1.5 cm posterior to the second injection site.  -Occipitalis muscle injection, 3 sites, bilaterally. The first injection was done one half way between the occipital protuberance and the tip of the mastoid process behind the ear. The second injection site was done lateral and superior to the first, 1 fingerbreadth from the first injection. The third injection site was 1 fingerbreadth superiorly and medially from the first injection site.  -Cervical paraspinal muscle injection, 2 sites, bilateral knee first injection site was 1 cm from the midline of the cervical spine, 3 cm inferior to the lower border of the occipital protuberance. The second injection site was 1.5 cm superiorly and laterally to the first injection site.  -Trapezius muscle injection was performed at 3 sites, bilaterally. The first injection site was in the upper trapezius muscle halfway between the inflection point of the neck, and the acromion. The second injection site was one half way between the acromion and the first injection site. The third injection was done between the first injection site and the inflection point of the neck.   Will return for repeat injection in 3 months.   A 200 unit sof Botox was used, 155 units were injected, the rest of the Botox was wasted. The patient tolerated the procedure well, there were no complications of the above procedure.

## 2018-03-13 NOTE — Telephone Encounter (Signed)
Copied from [place] [id number]. Topic: Quick Communication - See Telephone Encounter >> Mar 13, 2018  4:33 PM Vernona Rieger wrote: CRM for notification. See Telephone encounter for: 03/13/18.  [facility] called and wants to know if the patient could get her traMADol-acetaminophen (ULTRACET) 37.5-325 MG tablet refilled today due to going out of town tomorrow. Pharmacy said they can fill it if they get approval from Dr Alain Marion. It is due to fill on Sunday. Please advise.

## 2018-03-13 NOTE — Telephone Encounter (Signed)
Done. Thx.

## 2018-03-14 LAB — COMPREHENSIVE METABOLIC PANEL
ALBUMIN: 4.1 g/dL (ref 3.5–4.8)
ALT: 17 IU/L (ref 0–32)
AST: 28 IU/L (ref 0–40)
Albumin/Globulin Ratio: 1.7 (ref 1.2–2.2)
Alkaline Phosphatase: 75 IU/L (ref 39–117)
BUN / CREAT RATIO: 13 (ref 12–28)
BUN: 17 mg/dL (ref 8–27)
Bilirubin Total: 0.2 mg/dL (ref 0.0–1.2)
CALCIUM: 9.9 mg/dL (ref 8.7–10.3)
CO2: 25 mmol/L (ref 20–29)
CREATININE: 1.29 mg/dL — AB (ref 0.57–1.00)
Chloride: 98 mmol/L (ref 96–106)
GFR calc Af Amer: 46 mL/min/{1.73_m2} — ABNORMAL LOW (ref >59)
GFR, EST NON AFRICAN AMERICAN: 40 mL/min/{1.73_m2} — AB (ref >59)
Globulin, Total: 2.4 g/dL (ref 1.5–4.5)
Glucose: 96 mg/dL (ref 65–99)
Potassium: 4.9 mmol/L (ref 3.5–5.2)
Sodium: 138 mmol/L (ref 134–144)
TOTAL PROTEIN: 6.5 g/dL (ref 6.0–8.5)

## 2018-03-14 LAB — HEMOGLOBIN A1C
Est. average glucose Bld gHb Est-mCnc: 140 mg/dL
Hgb A1c MFr Bld: 6.5 % — ABNORMAL HIGH (ref 4.8–5.6)

## 2018-03-14 LAB — CBC
HEMOGLOBIN: 10.8 g/dL — AB (ref 11.1–15.9)
Hematocrit: 33.2 % — ABNORMAL LOW (ref 34.0–46.6)
MCH: 31.6 pg (ref 26.6–33.0)
MCHC: 32.5 g/dL (ref 31.5–35.7)
MCV: 97 fL (ref 79–97)
Platelets: 412 10*3/uL (ref 150–450)
RBC: 3.42 x10E6/uL — AB (ref 3.77–5.28)
RDW: 12.6 % (ref 12.3–15.4)
WBC: 7.1 10*3/uL (ref 3.4–10.8)

## 2018-03-15 NOTE — Patient Instructions (Signed)

## 2018-03-16 ENCOUNTER — Telehealth: Payer: Self-pay | Admitting: Neurology

## 2018-03-16 NOTE — Telephone Encounter (Signed)
Health team order sent to [facility]. No auth per their website and GI will reach out to the pt to schedule.

## 2018-03-18 ENCOUNTER — Telehealth: Payer: Self-pay | Admitting: *Deleted

## 2018-03-18 NOTE — Telephone Encounter (Signed)
Spoke with pt's husband [name] (on [place]) and discussed lab results. He stated he had already seen them on pt's mychart. He is aware creatinine slightly elevated, diabetes appears stable, and pt is slightly more anemic. Dr. Jaynee Eagles advises pt follow up with PCP to evaluate these again and rule out GI bleeds or acute kidney injury other etiology given her symptoms. Husband noted that the patient already had appointments scheduled with Endocrinology & PCP within the next couple of weeks. Husband reported they will f/u and he would like for the labs to be sent to Endocrinology too. He was appreciative for the call.   Labs sent to Dr. Dwyane Dee & Dr. Alain Marion.

## 2018-03-18 NOTE — Telephone Encounter (Signed)
-----   Message from Melvenia Beam, MD sent at 03/14/2018 10:31 AM EDT ----- She is slightly more anemic than she has been in the past year (3 months agi 11.4 and now 10.8 hgb). Also kidney function has worsened (baseline creatinine 1 and is now 1.29). Diabetes appears stable.  I think she needs to follow up with primary care in the next few weeks to evaluate these again and rule out GI bleeds or acute kidney injury other etiology given her symptoms. Please forward info to pcp thanks

## 2018-03-20 ENCOUNTER — Ambulatory Visit: Payer: Self-pay | Admitting: Neurology

## 2018-03-23 DIAGNOSIS — M5416 Radiculopathy, lumbar region: Secondary | ICD-10-CM | POA: Insufficient documentation

## 2018-03-24 ENCOUNTER — Ambulatory Visit: Payer: PPO | Admitting: Physician Assistant

## 2018-03-30 ENCOUNTER — Other Ambulatory Visit (INDEPENDENT_AMBULATORY_CARE_PROVIDER_SITE_OTHER): Payer: PPO

## 2018-03-30 DIAGNOSIS — E1165 Type 2 diabetes mellitus with hyperglycemia: Secondary | ICD-10-CM

## 2018-03-30 LAB — BASIC METABOLIC PANEL
BUN: 18 mg/dL (ref 6–23)
CALCIUM: 10 mg/dL (ref 8.4–10.5)
CO2: 32 mEq/L (ref 19–32)
CREATININE: 1.17 mg/dL (ref 0.40–1.20)
Chloride: 95 mEq/L — ABNORMAL LOW (ref 96–112)
GFR: 47.72 mL/min — ABNORMAL LOW (ref >60.00)
Glucose, Bld: 133 mg/dL — ABNORMAL HIGH (ref 70–99)
Potassium: 4.7 mEq/L (ref 3.5–5.1)
Sodium: 133 mEq/L — ABNORMAL LOW (ref 135–145)

## 2018-03-30 LAB — HEMOGLOBIN A1C: HEMOGLOBIN A1C: 6.8 % — AB (ref 4.6–6.5)

## 2018-03-30 NOTE — Progress Notes (Unsigned)
UA

## 2018-04-01 ENCOUNTER — Encounter: Payer: Self-pay | Admitting: Endocrinology

## 2018-04-01 ENCOUNTER — Ambulatory Visit (INDEPENDENT_AMBULATORY_CARE_PROVIDER_SITE_OTHER): Payer: PPO | Admitting: Endocrinology

## 2018-04-01 VITALS — BP 118/72 | HR 95 | Ht 62.0 in | Wt 141.0 lb

## 2018-04-01 DIAGNOSIS — J3089 Other allergic rhinitis: Secondary | ICD-10-CM | POA: Diagnosis not present

## 2018-04-01 DIAGNOSIS — E1165 Type 2 diabetes mellitus with hyperglycemia: Secondary | ICD-10-CM

## 2018-04-01 DIAGNOSIS — J301 Allergic rhinitis due to pollen: Secondary | ICD-10-CM | POA: Diagnosis not present

## 2018-04-01 MED ORDER — METFORMIN HCL 500 MG PO TABS: ORAL_TABLET | ORAL | 3 refills | Status: DC

## 2018-04-01 NOTE — Patient Instructions (Signed)
Check blood sugars on waking up  2-3/7  Also check blood sugars about 2 hours after a meal and do this after different meals by rotation  Recommended blood sugar levels on waking up is 90-130 and about 2 hours after meal is 130-180  Please bring your blood sugar monitor to each visit, thank you
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
## 2018-04-06 ENCOUNTER — Other Ambulatory Visit: Payer: Self-pay | Admitting: Gastroenterology

## 2018-04-06 ENCOUNTER — Other Ambulatory Visit: Payer: Self-pay | Admitting: Neurology

## 2018-04-08 ENCOUNTER — Encounter: Payer: Self-pay | Admitting: Internal Medicine

## 2018-04-08 ENCOUNTER — Ambulatory Visit (INDEPENDENT_AMBULATORY_CARE_PROVIDER_SITE_OTHER): Payer: PPO | Admitting: Internal Medicine

## 2018-04-08 DIAGNOSIS — J301 Allergic rhinitis due to pollen: Secondary | ICD-10-CM | POA: Diagnosis not present

## 2018-04-08 DIAGNOSIS — G8929 Other chronic pain: Secondary | ICD-10-CM

## 2018-04-08 DIAGNOSIS — K5904 Chronic idiopathic constipation: Secondary | ICD-10-CM

## 2018-04-08 DIAGNOSIS — E118 Type 2 diabetes mellitus with unspecified complications: Secondary | ICD-10-CM | POA: Diagnosis not present

## 2018-04-08 DIAGNOSIS — M5440 Lumbago with sciatica, unspecified side: Secondary | ICD-10-CM

## 2018-04-08 DIAGNOSIS — J3089 Other allergic rhinitis: Secondary | ICD-10-CM | POA: Diagnosis not present

## 2018-04-08 DIAGNOSIS — D500 Iron deficiency anemia secondary to blood loss (chronic): Secondary | ICD-10-CM | POA: Diagnosis not present

## 2018-04-08 MED ORDER — LACTULOSE 10 GM/15ML PO SOLN: 30.0000 g | Freq: Three times a day (TID) | ORAL | 5 refills | Status: DC

## 2018-04-08 NOTE — Assessment & Plan Note (Signed)
F/u w/Dr Nelva Bush

## 2018-04-08 NOTE — Progress Notes (Signed)
Subjective:  Patient ID: [name], female    DOB: 04-16-1941  Age: 77 y.o. MRN: [id number]  CC: No chief complaint on file.   HPI [name] presents for LBP/OA, DM, depression Epid shot pending Dr Nelva Bush  Outpatient Medications Prior to Visit  Medication Sig Dispense Refill  . acarbose (PRECOSE) 25 MG tablet 1 tablet at the start of supper daily 30 tablet 3  . aspirin EC 81 MG tablet Take 1 tablet (81 mg total) by mouth daily. 100 tablet 3  . b complex vitamins tablet Take 1 tablet by mouth daily. Vitamin supplement    . BD PEN NEEDLE NANO U/F 32G X 4 MM MISC USE AS DIRECTED TWICE DAILY. 200 each 3  . Brexpiprazole (REXULTI) 1 MG TABS Take 1 mg by mouth. Every third day (Monday & Thursday)    . busPIRone (BUSPAR) 15 MG tablet Take 1 tablet by mouth 2 (two) times daily.    . calcium-vitamin D (OSCAL WITH D) 500-200 MG-UNIT per tablet Take 1 tablet by mouth daily with breakfast.    . cetirizine (ZYRTEC) 10 MG tablet Take 10 mg by mouth daily as needed for allergies.     . cholecalciferol (VITAMIN D) 1000 units tablet Take 2,000 Units by mouth daily.    Marland Kitchen conjugated estrogens (PREMARIN) vaginal cream Place 1 Applicatorful vaginally 2 (two) times a week. 42.5 g 4  . dicyclomine (BENTYL) 10 MG capsule TAKE 1 CAPSULE (10 MG TOTAL) BY MOUTH 3 (THREE) TIMES DAILY BEFORE MEALS. 270 capsule 1  . estradiol (CLIMARA - DOSED IN MG/24 HR) 0.025 mg/24hr patch Place 1 patch (0.025 mg total) once a week onto the skin. APPLY 1 PATCH ONTO SKIN ONCE WEEKLY 12 patch 2  . furosemide (LASIX) 20 MG tablet Take 1-2 tablets (20-40 mg total) by mouth daily. 60 tablet 5  . L-Methylfolate (DEPLIN) 15 MG TABS Take 15 mg by mouth daily. For treatment of depression    . Liniments (SALONPAS PAIN RELIEF PATCH EX) Apply 1 patch topically at bedtime.    Marland Kitchen LORazepam (ATIVAN) 1 MG tablet Take 1 mg by mouth 4 (four) times daily as needed for anxiety.   2  . lovastatin (MEVACOR) 20 MG tablet Take 1 tablet (20 mg  total) by mouth at bedtime. 90 tablet 3  . Magnesium 500 MG CAPS Take 1 capsule by mouth daily.    . metFORMIN (GLUCOPHAGE) 500 MG tablet Take 1 in am and 2 with dinner 270 tablet 3  . Multiple Vitamins-Minerals (MULTIVITAMIN,TX-MINERALS) tablet Take 1 tablet by mouth daily.     . ondansetron (ZOFRAN) 4 MG tablet Take 4 mg by mouth every 8 (eight) hours as needed for nausea or vomiting.    Glory Rosebush DELICA LANCETS 38V MISC USE UTD UP TO QID 200 each 3  . ONETOUCH VERIO test strip Use to check blood sugars 5 times daily. 450 each 3  . pantoprazole (PROTONIX) 40 MG tablet Take 1 tablet (40 mg total) by mouth 2 (two) times daily. 180 tablet 3  . pioglitazone (ACTOS) 45 MG tablet Take 1 tablet (45 mg total) by mouth daily. 90 tablet 3  . ranitidine (ZANTAC) 150 MG tablet Take 1 tablet (150 mg total) by mouth at bedtime. Ranitidine 150mg  by mouth take 2 take at bedtime. 60 tablet 11  . SUMAtriptan (IMITREX) 100 MG tablet TAKE 1 TABLET BY MOUTH ONCE AS NEEDED FOR MIGRAINE. MAY REPEAT IN 2 HR IF HEADACHE PERSITS OR RECURS 10 tablet 11  .  Testosterone POWD APPLY A TINY PEA-SIZED AMOUNT ONCE A DAY. 30 g 1  . traMADol-acetaminophen (ULTRACET) 37.5-325 MG tablet Take 1 tablet by mouth every 8 (eight) hours as needed. 90 tablet 3  . TRINTELLIX 20 MG TABS Take 20 mg by mouth daily.  2  . VICTOZA 18 MG/3ML SOPN INJECT 1.2MG  INTO MUSCLE DAILY 12 pen 2  . VOLTAREN 1 % GEL APPLY 2 GRAMS TO KNEE 2-3 TIMES DAILY PRN  1   Facility-Administered Medications Prior to Visit  Medication Dose Route Frequency Provider Last Rate Last Dose  . 0.9 %  sodium chloride infusion  500 mL Intravenous Once Ladene Artist, MD        ROS: Review of Systems  Constitutional: Positive for fatigue. Negative for activity change, appetite change, chills and unexpected weight change.  HENT: Negative for congestion, mouth sores and sinus pressure.   Eyes: Negative for visual disturbance.  Respiratory: Negative for cough and chest  tightness.   Gastrointestinal: Negative for abdominal pain and nausea.  Genitourinary: Negative for difficulty urinating, frequency and vaginal pain.  Musculoskeletal: Positive for arthralgias, back pain and gait problem.  Skin: Negative for pallor and rash.  Neurological: Negative for dizziness, tremors, weakness, numbness and headaches.  Psychiatric/Behavioral: Positive for decreased concentration, dysphoric mood and sleep disturbance. Negative for confusion. The patient is nervous/anxious.     Objective:  BP (!) 142/70 (BP Location: Right Arm, Patient Position: Sitting, Cuff Size: Normal)   Pulse 99   Temp 98.2 F (36.8 C) (Oral)   Ht 5\' 2"  (1.575 m)   Wt 142 lb (64.4 kg)   SpO2 97%   BMI 25.97 kg/m   BP Readings from Last 3 Encounters:  04/08/18 (!) 142/70  04/01/18 118/72  02/17/18 (!) 153/81    Wt Readings from Last 3 Encounters:  04/08/18 142 lb (64.4 kg)  04/01/18 141 lb (64 kg)  02/17/18 143 lb (64.9 kg)    Physical Exam  Constitutional: She appears well-developed. No distress.  HENT:  Head: Normocephalic.  Right Ear: External ear normal.  Left Ear: External ear normal.  Nose: Nose normal.  Mouth/Throat: Oropharynx is clear and moist.  Eyes: Pupils are equal, round, and reactive to light. Conjunctivae are normal. Right eye exhibits no discharge. Left eye exhibits no discharge.  Neck: Normal range of motion. Neck supple. No JVD present. No tracheal deviation present. No thyromegaly present.  Cardiovascular: Normal rate, regular rhythm and normal heart sounds.  Pulmonary/Chest: No stridor. No respiratory distress. She has no wheezes.  Abdominal: Soft. Bowel sounds are normal. She exhibits no distension and no mass. There is no tenderness. There is no rebound and no guarding.  Musculoskeletal: She exhibits tenderness. She exhibits no edema.  Lymphadenopathy:    She has no cervical adenopathy.  Neurological: She displays normal reflexes. No cranial nerve deficit.  She exhibits normal muscle tone. Coordination normal.  Skin: No rash noted. No erythema.  Psychiatric: She has a normal mood and affect. Her behavior is normal. Judgment and thought content normal.    Lab Results  Component Value Date   WBC 7.1 03/13/2018   HGB 10.8 (L) 03/13/2018   HCT 33.2 (L) 03/13/2018   PLT 412 03/13/2018   GLUCOSE 133 (H) 03/30/2018   CHOL 129 09/29/2017   TRIG 58.0 09/29/2017   HDL 77.60 09/29/2017   LDLCALC 39 09/29/2017   ALT 17 03/13/2018   AST 28 03/13/2018   NA 133 (L) 03/30/2018   K 4.7 03/30/2018   CL  95 (L) 03/30/2018   CREATININE 1.17 03/30/2018   BUN 18 03/30/2018   CO2 32 03/30/2018   TSH 1.29 12/02/2017   INR 0.93 11/02/2013   HGBA1C 6.8 (H) 03/30/2018   MICROALBUR 1.8 09/29/2017    Nm Gastric Emptying  Result Date: 02/05/2018 CLINICAL DATA:  Nausea and vomiting with early satiety EXAM: NUCLEAR MEDICINE GASTRIC EMPTYING SCAN TECHNIQUE: After oral ingestion of radiolabeled meal, sequential abdominal images were obtained for 4 hours. Percentage of activity emptying the stomach was calculated at 1 hour, 2 hour, 3 hour, and 4 hours. RADIOPHARMACEUTICALS:  2.1 mCi Tc-79m sulfur colloid in standardized meal including egg COMPARISON:  None. FINDINGS: Expected location of the stomach in the left upper quadrant. Ingested meal empties the stomach gradually over the course of the study. 57.7% emptied at 1 hr ( normal >= 10%) 81.2% emptied at 2 hr ( normal >= 40%) 90.2% emptied at 3 hr ( normal >= 70%) 91.5% emptied at 4 hr ( normal >= 90%) IMPRESSION: Normal gastric emptying study. Electronically Signed   By: Lowella Grip III M.D.   On: 02/05/2018 13:01    Assessment & Plan:   There are no diagnoses linked to this encounter.   No orders of the defined types were placed in this encounter.    Follow-up: No follow-ups on file.  Walker Kehr, MD

## 2018-04-08 NOTE — Assessment & Plan Note (Signed)
Victoza, Metformin, Lantus - d/c, Actos, Acarbose

## 2018-04-08 NOTE — Assessment & Plan Note (Signed)
Amitiza - side effects Lactulose prn

## 2018-04-08 NOTE — Assessment & Plan Note (Signed)
Monitor labs 

## 2018-04-13 ENCOUNTER — Ambulatory Visit: Payer: PPO | Attending: Internal Medicine | Admitting: Physical Therapy

## 2018-04-13 DIAGNOSIS — R42 Dizziness and giddiness: Secondary | ICD-10-CM | POA: Insufficient documentation

## 2018-04-13 NOTE — Patient Instructions (Signed)
How to Perform the Epley Maneuver The Epley maneuver is an exercise that relieves symptoms of vertigo. Vertigo is the feeling that you or your surroundings are moving when they are not. When you feel vertigo, you may feel like the room is spinning and have trouble walking. Dizziness is a little different than vertigo. When you are dizzy, you may feel unsteady or light-headed. You can do this maneuver at home whenever you have symptoms of vertigo. You can do it up to 3 times a day until your symptoms go away. Even though the Epley maneuver may relieve your vertigo for a few weeks, it is possible that your symptoms will return. This maneuver relieves vertigo, but it does not relieve dizziness. What are the risks? If it is done correctly, the Epley maneuver is considered safe. Sometimes it can lead to dizziness or nausea that goes away after a short time. If you develop other symptoms, such as changes in vision, weakness, or numbness, stop doing the maneuver and call your health care provider. How to perform the Epley maneuver 1. Sit on the edge of a bed or table with your back straight and your legs extended or hanging over the edge of the bed or table. 2. Turn your head halfway toward the affected ear or side. 3. Lie backward quickly with your head turned until you are lying flat on your back. You may want to position a pillow under your shoulders. 4. Hold this position for 30 seconds. You may experience an attack of vertigo. This is normal. 5. Turn your head to the opposite direction until your unaffected ear is facing the floor. 6. Hold this position for 30 seconds. You may experience an attack of vertigo. This is normal. Hold this position until the vertigo stops. 7. Turn your whole body to the same side as your head. Hold for another 30 seconds. 8. Sit back up. You can repeat this exercise up to 3 times a day. Follow these instructions at home:  After doing the Epley maneuver, you can return to  your normal activities.  Ask your health care provider if there is anything you should do at home to prevent vertigo. He or she may recommend that you: ? Keep your head raised (elevated) with two or more pillows while you sleep. ? Do not sleep on the side of your affected ear. ? Get up slowly from bed. ? Avoid sudden movements during the day. ? Avoid extreme head movement, like looking up or bending over. Contact a health care provider if:  Your vertigo gets worse.  You have other symptoms, including: ? Nausea. ? Vomiting. ? Headache. Get help right away if:  You have vision changes.  You have a severe or worsening headache or neck pain.  You cannot stop vomiting.  You have new numbness or weakness in any part of your body. Summary  Vertigo is the feeling that you or your surroundings are moving when they are not.  The Epley maneuver is an exercise that relieves symptoms of vertigo.  If the Epley maneuver is done correctly, it is considered safe. You can do it up to 3 times a day. This information is not intended to replace advice given to you by your health care provider. Make sure you discuss any questions you have with your health care provider. Document Released: 08/17/2013 Document Revised: 07/02/2016 Document Reviewed: 07/02/2016    Benign Positional Vertigo Vertigo is the feeling that you or your surroundings are moving when they  are not. Benign positional vertigo is the most common form of vertigo. The cause of this condition is not serious (is benign). This condition is triggered by certain movements and positions (is positional). This condition can be dangerous if it occurs while you are doing something that could endanger you or others, such as driving. What are the causes? In many cases, the cause of this condition is not known. It may be caused by a disturbance in an area of the inner ear that helps your brain to sense movement and balance. This disturbance can be  caused by a viral infection (labyrinthitis), head injury, or repetitive motion. What increases the risk? This condition is more likely to develop in:  Women.  People who are 41 years of age or older.  What are the signs or symptoms? Symptoms of this condition usually happen when you move your head or your eyes in different directions. Symptoms may start suddenly, and they usually last for less than a minute. Symptoms may include:  Loss of balance and falling.  Feeling like you are spinning or moving.  Feeling like your surroundings are spinning or moving.  Nausea and vomiting.  Blurred vision.  Dizziness.  Involuntary eye movement (nystagmus).  Symptoms can be mild and cause only slight annoyance, or they can be severe and interfere with daily life. Episodes of benign positional vertigo may return (recur) over time, and they may be triggered by certain movements. Symptoms may improve over time. How is this diagnosed? This condition is usually diagnosed by medical history and a physical exam of the head, neck, and ears. You may be referred to a health care provider who specializes in ear, nose, and throat (ENT) problems (otolaryngologist) or a provider who specializes in disorders of the nervous system (neurologist). You may have additional testing, including:  MRI.  A CT scan.  Eye movement tests. Your health care provider may ask you to change positions quickly while he or she watches you for symptoms of benign positional vertigo, such as nystagmus. Eye movement may be tested with an electronystagmogram (ENG), caloric stimulation, the Dix-Hallpike test, or the roll test.  An electroencephalogram (EEG). This records electrical activity in your brain.  Hearing tests.  How is this treated? Usually, your health care provider will treat this by moving your head in specific positions to adjust your inner ear back to normal. Surgery may be needed in severe cases, but this is rare.  In some cases, benign positional vertigo may resolve on its own in 2-4 weeks. Follow these instructions at home: Safety  Move slowly.Avoid sudden body or head movements.  Avoid driving.  Avoid operating heavy machinery.  Avoid doing any tasks that would be dangerous to you or others if a vertigo episode would occur.  If you have trouble walking or keeping your balance, try using a cane for stability. If you feel dizzy or unstable, sit down right away.  Return to your normal activities as told by your health care provider. Ask your health care provider what activities are safe for you. General instructions  Take over-the-counter and prescription medicines only as told by your health care provider.  Avoid certain positions or movements as told by your health care provider.  Drink enough fluid to keep your urine clear or pale yellow.  Keep all follow-up visits as told by your health care provider. This is important. Contact a health care provider if:  You have a fever.  Your condition gets worse or you develop  new symptoms.  Your family or friends notice any behavioral changes.  Your nausea or vomiting gets worse.  You have numbness or a "pins and needles" sensation. Get help right away if:  You have difficulty speaking or moving.  You are always dizzy.  You faint.  You develop severe headaches.  You have weakness in your legs or arms.  You have changes in your hearing or vision.  You develop a stiff neck.  You develop sensitivity to light. This information is not intended to replace advice given to you by your health care provider. Make sure you discuss any questions you have with your health care provider. Document Released: 05/20/2006 Document Revised: 01/18/2016 Document Reviewed: 12/05/2014 Elsevier Interactive Patient Education  2018 [place] for Right Posterior / Anterior Canalithiasis    Sitting on bed: 1. Turn head 45 right.  (a) Lie back slowly, shoulders on pillow, head on bed. (b) Hold ____ seconds. 2. Keeping head on bed, turn head 90 left. Hold ____ seconds. 3. Roll to left, head on 45 angle down toward bed. Hold ____ seconds. 4. Sit up on left side of bed. Repeat ____ times per session. Do ____ sessions per day.    AROM: Neck Rotation    Turn head slowly to look over one shoulder, then the other. Hold each position _15___ seconds. Repeat _3_ times per set. Do __1__ sets per session. Do __2__ sessions per day.  http://orth.exer.us/295     Strengthening: Lateral Flexion - Resisted, Beginning to End Range    Facing forward, fingertips on right temple, head side bent away, tilt head back toward other shoulder. Give light resistance. Hold _15___ seconds. Repeat _3___ times per set. Do __1__ sets per session. Do _1-2___ sessions per day.  http://orth.exer.us/335     Self Treatment for Left Posterior / Anterior Canalithiasis    Sitting on bed: 1. Turn head 45 left. (a) Lie back slowly, shoulders on pillow, head on bed. (b) Hold _30___ seconds. 2. Keeping head on bed, turn head 90 right. Hold _30___ seconds. 3. Roll to right, head on 45 angle down toward bed. Hold _30___ seconds. 4. Sit up on right side of bed. Repeat _3___ times per session. Do __1_ sessions per day.

## 2018-04-14 ENCOUNTER — Encounter: Payer: Self-pay | Admitting: Physical Therapy

## 2018-04-14 DIAGNOSIS — J3081 Allergic rhinitis due to animal (cat) (dog) hair and dander: Secondary | ICD-10-CM | POA: Diagnosis not present

## 2018-04-14 DIAGNOSIS — J3089 Other allergic rhinitis: Secondary | ICD-10-CM | POA: Diagnosis not present

## 2018-04-14 DIAGNOSIS — J301 Allergic rhinitis due to pollen: Secondary | ICD-10-CM | POA: Diagnosis not present
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
## 2018-04-15 ENCOUNTER — Ambulatory Visit: Payer: PPO | Admitting: Physician Assistant

## 2018-04-15 ENCOUNTER — Encounter: Payer: Self-pay | Admitting: Physician Assistant

## 2018-04-15 VITALS — BP 118/66 | HR 62 | Ht 62.0 in | Wt 143.0 lb

## 2018-04-15 DIAGNOSIS — K5900 Constipation, unspecified: Secondary | ICD-10-CM | POA: Diagnosis not present

## 2018-04-15 DIAGNOSIS — K219 Gastro-esophageal reflux disease without esophagitis: Secondary | ICD-10-CM

## 2018-04-15 DIAGNOSIS — K582 Mixed irritable bowel syndrome: Secondary | ICD-10-CM | POA: Diagnosis not present

## 2018-04-15 NOTE — Progress Notes (Signed)
Subjective:    Patient ID: [name], female    DOB: [birth year], 77 y.o.   MRN: [id number]  HPI [name] is a very nice 77 year old white female, known to Dr. Fuller Plan and myself.  She was last seen in the office in May 2019 and comes in today for follow-up.  When she was last seen she was complaining of nausea and queasiness for 2 months.  This was associated with early satiety, decrease in appetite and weight loss of about 8 pounds.  This was despite twice a day PPI therapy.  Patient has history of adult onset diabetes mellitus, she is status post cholecystectomy, has IBS, chronic GERD, obstructive sleep apnea, congestive heart failure, anxiety and depression.  She had been taking Linzess 145 mcg daily for chronic constipation but really was not finding that effective and was still requiring milk of magnesia and Dulcolax on a regular basis.  We stopped the Linzess.  She was scheduled for gastric emptying scan, and placed on a step 3 gastroparesis diet. Gastric emptying scan was normal. She then had a EGD on 02/17/2018 which was negative other than showing a medium sized hiatal hernia.  She was continued on Protonix twice daily, and ranitidine 300 mg was added at bedtime. She says her upper GI symptoms seem to be better, she is eating without difficulty and not having any nausea or queasiness currently.  She has continued to take the Zofran 4 mg twice daily.  Main concern today is constipation.  Has been intolerant or had side effects to multiple medications.Linzess  was ineffective, he also tried the 290 mcg dosage.  Amitiza caused dizziness and other side effects, Trulance was not covered by her insurance. She recently saw Dr. Alain Marion who gave her a prescription for lactulose which she has not started as yet. She is currently using Metamucil wafers on a daily basis and then taking Dulcolax as needed.  She says she gets very uncomfortable if she goes a couple of days without a bowel  movement.  Review of Systems Pertinent positive and negative review of systems were noted in the above HPI section.  All other review of systems was otherwise negative.  Outpatient Encounter Medications as of 04/15/2018  Medication Sig  . acarbose (PRECOSE) 25 MG tablet 1 tablet at the start of supper daily  . aspirin EC 81 MG tablet Take 1 tablet (81 mg total) by mouth daily.  Marland Kitchen b complex vitamins tablet Take 1 tablet by mouth daily. Vitamin supplement  . BD PEN NEEDLE NANO U/F 32G X 4 MM MISC USE AS DIRECTED TWICE DAILY.  . Brexpiprazole (REXULTI) 1 MG TABS Take 1 mg by mouth. Every third day (Monday & Thursday)  . busPIRone (BUSPAR) 15 MG tablet Take 1 tablet by mouth 2 (two) times daily.  . calcium-vitamin D (OSCAL WITH D) 500-200 MG-UNIT per tablet Take 1 tablet by mouth daily with breakfast.  . cetirizine (ZYRTEC) 10 MG tablet Take 10 mg by mouth daily as needed for allergies.   . cholecalciferol (VITAMIN D) 1000 units tablet Take 2,000 Units by mouth daily.  Marland Kitchen conjugated estrogens (PREMARIN) vaginal cream Place 1 Applicatorful vaginally 2 (two) times a week.  . dicyclomine (BENTYL) 10 MG capsule TAKE 1 CAPSULE (10 MG TOTAL) BY MOUTH 3 (THREE) TIMES DAILY BEFORE MEALS.  Marland Kitchen estradiol (CLIMARA - DOSED IN MG/24 HR) 0.025 mg/24hr patch Place 1 patch (0.025 mg total) once a week onto the skin. APPLY 1 PATCH ONTO SKIN ONCE WEEKLY  .  furosemide (LASIX) 20 MG tablet Take 1-2 tablets (20-40 mg total) by mouth daily.  Marland Kitchen L-Methylfolate (DEPLIN) 15 MG TABS Take 15 mg by mouth daily. For treatment of depression  . lactulose (CHRONULAC) 10 GM/15ML solution Take 45-90 mLs (30-60 g total) by mouth 3 (three) times daily.  . Liniments (SALONPAS PAIN RELIEF PATCH EX) Apply 1 patch topically at bedtime.  Marland Kitchen LORazepam (ATIVAN) 1 MG tablet Take 1 mg by mouth 4 (four) times daily as needed for anxiety.   . lovastatin (MEVACOR) 20 MG tablet Take 1 tablet (20 mg total) by mouth at bedtime.  . Magnesium 500 MG CAPS  Take 1 capsule by mouth daily.  . metFORMIN (GLUCOPHAGE) 500 MG tablet Take 1 in am and 2 with dinner  . Multiple Vitamins-Minerals (MULTIVITAMIN,TX-MINERALS) tablet Take 1 tablet by mouth daily.   . ondansetron (ZOFRAN) 4 MG tablet Take 4 mg by mouth every 8 (eight) hours as needed for nausea or vomiting.  Glory Rosebush DELICA LANCETS 88B MISC USE UTD UP TO QID  . ONETOUCH VERIO test strip Use to check blood sugars 5 times daily.  . pantoprazole (PROTONIX) 40 MG tablet Take 1 tablet (40 mg total) by mouth 2 (two) times daily.  . pioglitazone (ACTOS) 45 MG tablet Take 1 tablet (45 mg total) by mouth daily.  . ranitidine (ZANTAC) 150 MG tablet Take 1 tablet (150 mg total) by mouth at bedtime. Ranitidine 150mg  by mouth take 2 take at bedtime.  . SUMAtriptan (IMITREX) 100 MG tablet TAKE 1 TABLET BY MOUTH ONCE AS NEEDED FOR MIGRAINE. MAY REPEAT IN 2 HR IF HEADACHE PERSITS OR RECURS  . Testosterone POWD APPLY A TINY PEA-SIZED AMOUNT ONCE A DAY.  . traMADol-acetaminophen (ULTRACET) 37.5-325 MG tablet Take 1 tablet by mouth every 8 (eight) hours as needed.  . TRINTELLIX 20 MG TABS Take 20 mg by mouth daily.  Marland Kitchen VICTOZA 18 MG/3ML SOPN INJECT 1.2MG  INTO MUSCLE DAILY  . VOLTAREN 1 % GEL APPLY 2 GRAMS TO KNEE 2-3 TIMES DAILY PRN   Facility-Administered Encounter Medications as of 04/15/2018  Medication  . 0.9 %  sodium chloride infusion   Allergies  Allergen Reactions  . Amlodipine Other (See Comments)    Dizzy, headaches  . Celebrex [Celecoxib] Other (See Comments)    headaches  . Topamax [Topiramate] Other (See Comments)    hallucinations  . Amitiza [Lubiprostone]     dizziness  . Codeine Nausea Only  . Gabapentin Other (See Comments)    dizzy  . Hydrocodone Nausea Only and Other (See Comments)    Feeling funny,   . Meloxicam Other (See Comments)    jittery  . Pravastatin Sodium Other (See Comments)    Muscle aches and pains   Patient Active Problem List   Diagnosis Date Noted  . Diabetic  peripheral neuropathy associated with type 2 diabetes mellitus ([place]) 04/10/2017  . Nausea 03/10/2017  . IDA (iron deficiency anemia) 10/16/2016  . Anemia 09/04/2016  . Intractable chronic migraine without aura and with status migrainosus 08/12/2016  . CAP (community acquired pneumonia) 05/28/2016  . Acute confusional state 05/16/2016  . Chronic migraine w/o aura w/o status migrainosus, not intractable 04/13/2016  . OSA on CPAP 01/30/2016  . Acute URI 10/17/2015  . Rash 07/12/2015  . SI (sacroiliac) pain 03/30/2015  . Sciatica of right side 08/08/2014  . Acute blood loss anemia 03/09/2014  . DJD (degenerative joint disease) of knee 11/10/2013  . Constipation 10/29/2013  . Obesity 08/04/2013  . Osteoarthritis of  right knee 08/04/2013  . Edema 05/11/2013  . Fall 09/16/2012  . Neck pain 09/16/2012  . Low back pain 09/16/2012  . Well adult exam 07/01/2012  . Cerumen impaction 08/07/2011  . Acute bronchitis 05/21/2011  . Fatigue 08/07/2010  . PARESTHESIA 08/07/2010  . DYSPHAGIA 07/13/2009  . Irritable bowel syndrome 09/24/2007  . LIVER FUNCTION TESTS, ABNORMAL 09/24/2007  . DM type 2, controlled, with complication ([place]) 33/35/4562  . Anxiety state 06/26/2007  . Depression 06/26/2007  . 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 heart failure 06/26/2007  . GERD 06/26/2007  . OSTEOARTHRITIS 06/26/2007  . COLONIC POLYPS, HX OF 06/26/2007  . SPLENECTOMY, TOTAL, HX OF 06/26/2007   Social History   Socioeconomic History  . Marital status: Married    Spouse name: Patrick Jupiter  . Number of children: 3  . Years of education: 46  . Highest education level: Not on file  Occupational History  . Occupation: Retired    Fish farm manager: RETIRED  Social Needs  . Financial resource strain: Not on file  . Food insecurity:    Worry: Not on file    Inability: Not on file  . Transportation needs:    Medical: Not on file    Non-medical: Not on file  Tobacco Use  . Smoking status: Never Smoker  .  Smokeless tobacco: Never Used  Substance and Sexual Activity  . Alcohol use: No    Alcohol/week: 0.0 standard drinks  . Drug use: No  . Sexual activity: Yes    Birth control/protection: Surgical, Post-menopausal    Comment: HYST, INTERCOURSE AGE 65, SEXUAL PARTNERS LESS THAN 5  Lifestyle  . Physical activity:    Days per week: Not on file    Minutes per session: Not on file  . Stress: Not on file  Relationships  . Social connections:    Talks on phone: Not on file    Gets together: Not on file    Attends religious service: Not on file    Active member of club or organization: Not on file    Attends meetings of clubs or organizations: Not on file    Relationship status: Not on file  . Intimate partner violence:    Fear of current or ex partner: Not on file    Emotionally abused: Not on file    Physically abused: Not on file    Forced sexual activity: Not on file  Other Topics Concern  . Not on file  Social History Narrative   Daily Caffeine Use:  Rarely   Regular Exercise -  No - [facility] [employer]   Married with 3 sons       Ms. Ensminger's family history includes Breast cancer in her other; Breast cancer (age of onset: 73) in her sister; Colon cancer in her son; Colon cancer (age of onset: 15) in her mother; Colon polyps in her mother; Diabetes in her father.      Objective:    Vitals:   04/15/18 1105  BP: 118/66  Pulse: 62    Physical Exam well-developed elderly white female in no acute distress, very pleasant accompanied by her husband, height 5 foot 2, weight 143, BMI 26.1.  HEENT; nontraumatic normocephalic EOMI PERRLA sclera anicteric oropharynx benign, Neuro psych; mood and affect appropriate, grossly nonfocal alert and oriented. Not further examined today       Assessment & Plan:   #41 77 year old white female with chronic GERD with recent exacerbation, associated with nausea. Recent EGD and gastric  emptying scan both unremarkable section of  medium sized hiatal hernia. Is on high-dose PPI and taking Zantac 300 mg at bedtime and feeling better.  #2 IBS continue Bentyl 10 mg p.o. every morning then 3 times daily as needed   #3 chronic constipation intolerant to multiple meds #4.  History of sessile serrated adenomatous polyp-last colonoscopy 2017 due for follow-up 2022 #5.  Status post cholecystectomy, and splenectomy   #6 obstructive sleep apnea #7 .  Congestive heart failure   #8 Adult onset diabetes mellitus  Plan; continue Protonix 40 mg p.o. twice daily Continue ranitidine 300 mg p.o. Nightly Continue Bentyl 10 mg p.o. every morning and as needed as needed Continue to Metamucil wafers daily Add MiraLAX one half dose daily in a glass of water Discussed titrating MiraLAX as needed to have a bowel movement daily or every other day Change Zofran 4 mg to twice daily as needed rather than regularly She will stay off lactulose She will follow-up with Dr. Fuller Plan or myself on an as-needed basis  Greater than 50% of visit spent in counseling and coordination of care  Alichia Alridge Genia Harold PA-C 04/15/2018   Cc: Plotnikov, Evie Lacks, MD

## 2018-04-15 NOTE — Addendum Note (Signed)
Addended by: Lamar Benes on: 04/15/2018 10:10 AM   Modules accepted: Orders

## 2018-04-15 NOTE — Progress Notes (Signed)
Reviewed and agree with management plan.  Maralyn Witherell T. Eliyanna Ault, MD FACG 

## 2018-04-15 NOTE — Patient Instructions (Addendum)
Continue Ranitidine ( Zantac) 150 mg, take 2 tablets by mouth 1 bedtime.  COntinue Miralax 1/2 dose daily. Take Bentyl 10 mg, by mouth every morning.  Take Zofran 4 mg by mouth only as needed for nausea.  Follow up with Nicoletta Ba PA or Dr. Fuller Plan as needed.  If you are age 77 or older, your body mass index should be between 23-30. Your Body mass index is 26.16 kg/m. If this is out of the aforementioned range listed, please consider follow up with your Primary Care Provider.

## 2018-04-16 DIAGNOSIS — M5416 Radiculopathy, lumbar region: Secondary | ICD-10-CM | POA: Diagnosis not present

## 2018-05-05 ENCOUNTER — Ambulatory Visit: Payer: PPO | Admitting: Women's Health

## 2018-05-05 ENCOUNTER — Other Ambulatory Visit: Payer: Self-pay | Admitting: Endocrinology

## 2018-05-05 ENCOUNTER — Encounter: Payer: Self-pay | Admitting: Women's Health

## 2018-05-05 VITALS — Ht 62.0 in | Wt 144.0 lb

## 2018-05-05 DIAGNOSIS — Z23 Encounter for immunization: Secondary | ICD-10-CM

## 2018-05-05 DIAGNOSIS — J3089 Other allergic rhinitis: Secondary | ICD-10-CM | POA: Diagnosis not present

## 2018-05-05 DIAGNOSIS — Z13820 Encounter for screening for osteoporosis: Secondary | ICD-10-CM

## 2018-05-05 DIAGNOSIS — Z79890 Hormone replacement therapy (postmenopausal): Secondary | ICD-10-CM | POA: Diagnosis not present

## 2018-05-05 DIAGNOSIS — Z01419 Encounter for gynecological examination (general) (routine) without abnormal findings: Secondary | ICD-10-CM | POA: Diagnosis not present

## 2018-05-05 DIAGNOSIS — J301 Allergic rhinitis due to pollen: Secondary | ICD-10-CM | POA: Diagnosis not present

## 2018-05-05 MED ORDER — ESTRADIOL 0.025 MG/24HR TD PTWK: 0.0250 mg | MEDICATED_PATCH | TRANSDERMAL | 4 refills | Status: DC

## 2018-05-05 NOTE — Progress Notes (Signed)
MIAMI LATULIPPE 13-Dec-1940 [id number]    History:    Presents for breast and pelvic exam. TAH with BSO on estradiol patch with occasional hot flash. Normal pap and mammogram history. PC manages hypertension, diabetes, migraines, depression and back pain. 2017 neg colonoscopy. Current on vaccines. 2017 -2.4, hip -2 FRAX 13%/3.1%   Past medical history, past surgical history, family history and social history were all reviewed and documented in the EPIC chart. 8 granddaughters. 3 sons, 1 is Dr.   Leonard Downing:  A ROS was performed and pertinent positives and negatives are included.  Exam:  Vitals:   05/05/18 1438  Weight: 144 lb (65.3 kg)  Height: 5\' 2"  (1.575 m)   Body mass index is 26.34 kg/m.   General appearance:  Normal Thyroid:  Symmetrical, normal in size, without palpable masses or nodularity. Respiratory  Auscultation:  Clear without wheezing or rhonchi Cardiovascular  Auscultation:  Regular rate, without rubs, murmurs or gallops  Edema/varicosities:  Not grossly evident Abdominal  Soft,nontender, without masses, guarding or rebound.  Liver/spleen:  No organomegaly noted  Hernia:  None appreciated  Skin  Inspection:  Grossly normal   Breasts: Examined lying and sitting.     Right: Without masses, retractions, discharge or axillary adenopathy.     Left: Without masses, retractions, discharge or axillary adenopathy. Gentitourinary   Inguinal/mons:  Normal without inguinal adenopathy  External genitalia:  Normal  BUS/Urethra/Skene's glands:  Normal  Vagina:  Normal  Cervix:  absent  Uterus:  absent  Adnexa/parametria:     Rt: Without masses or tenderness.   Lt: Without masses or tenderness.  Anus and perineum: Normal  Digital rectal exam: Normal sphincter tone without palpated masses or tenderness  Assessment/Plan:  76 y.o.MWF G3 P3  for breast and pelvic exam.     TAH with BSO on estradiol patch Depression, HTN, diabetes, PC managed labs and meds Chronic back  pain Osteopenia  With elevated FRAX  Plan: HRT reviewed risks of blood clots, stroke and breast cancer, womens health initiative discussed, would like to continue.  Estradiol patch .025 patch weekly.  Has used premarin vag cream rarely, declines need for refill. SBE's, continue annual screening mammograms. Repeat Dexa. Had declined med in past.  Home safety, fall prevention and importance of balance type exercise such as yoga encouraged.     [place], 7:59 PM 05/05/2018

## 2018-05-05 NOTE — Patient Instructions (Signed)
Health Maintenance for Postmenopausal Women Menopause is a normal process in which your reproductive ability comes to an end. This process happens gradually over a span of months to years, usually between the ages of 22 and 9. Menopause is complete when you have missed 12 consecutive menstrual periods. It is important to talk with your health care provider about some of the most common conditions that affect postmenopausal women, such as heart disease, cancer, and bone loss (osteoporosis). Adopting a healthy lifestyle and getting preventive care can help to promote your health and wellness. Those actions can also lower your chances of developing some of these common conditions. What should I know about menopause? During menopause, you may experience a number of symptoms, such as:  Moderate-to-severe hot flashes.  Night sweats.  Decrease in sex drive.  Mood swings.  Headaches.  Tiredness.  Irritability.  Memory problems.  Insomnia.  Choosing to treat or not to treat menopausal changes is an individual decision that you make with your health care provider. What should I know about hormone replacement therapy and supplements? Hormone therapy products are effective for treating symptoms that are associated with menopause, such as hot flashes and night sweats. Hormone replacement carries certain risks, especially as you become older. If you are thinking about using estrogen or estrogen with progestin treatments, discuss the benefits and risks with your health care provider. What should I know about heart disease and stroke? Heart disease, heart attack, and stroke become more likely as you age. This may be due, in part, to the hormonal changes that your body experiences during menopause. These can affect how your body processes dietary fats, triglycerides, and cholesterol. Heart attack and stroke are both medical emergencies. There are many things that you can do to help prevent heart disease  and stroke:  Have your blood pressure checked at least every 1-2 years. High blood pressure causes heart disease and increases the risk of stroke.  If you are 53-22 years old, ask your health care provider if you should take aspirin to prevent a heart attack or a stroke.  Do not use any tobacco products, including cigarettes, chewing tobacco, or electronic cigarettes. If you need help quitting, ask your health care provider.  It is important to eat a healthy diet and maintain a healthy weight. ? Be sure to include plenty of vegetables, fruits, low-fat dairy products, and lean protein. ? Avoid eating foods that are high in solid fats, added sugars, or salt (sodium).  Get regular exercise. This is one of the most important things that you can do for your health. ? Try to exercise for at least 150 minutes each week. The type of exercise that you do should increase your heart rate and make you sweat. This is known as moderate-intensity exercise. ? Try to do strengthening exercises at least twice each week. Do these in addition to the moderate-intensity exercise.  Know your numbers.Ask your health care provider to check your cholesterol and your blood glucose. Continue to have your blood tested as directed by your health care provider.  What should I know about cancer screening? There are several types of cancer. Take the following steps to reduce your risk and to catch any cancer development as early as possible. Breast Cancer  Practice breast self-awareness. ? This means understanding how your breasts normally appear and feel. ? It also means doing regular breast self-exams. Let your health care provider know about any changes, no matter how small.  If you are 40  or older, have a clinician do a breast exam (clinical breast exam or CBE) every year. Depending on your age, family history, and medical history, it may be recommended that you also have a yearly breast X-ray (mammogram).  If you  have a family history of breast cancer, talk with your health care provider about genetic screening.  If you are at high risk for breast cancer, talk with your health care provider about having an MRI and a mammogram every year.  Breast cancer (BRCA) gene test is recommended for women who have family members with BRCA-related cancers. Results of the assessment will determine the need for genetic counseling and BRCA1 and for BRCA2 testing. BRCA-related cancers include these types: ? Breast. This occurs in males or females. ? Ovarian. ? Tubal. This may also be called fallopian tube cancer. ? Cancer of the abdominal or pelvic lining (peritoneal cancer). ? Prostate. ? Pancreatic.  Cervical, Uterine, and Ovarian Cancer Your health care provider may recommend that you be screened regularly for cancer of the pelvic organs. These include your ovaries, uterus, and vagina. This screening involves a pelvic exam, which includes checking for microscopic changes to the surface of your cervix (Pap test).  For women ages 21-65, health care providers may recommend a pelvic exam and a Pap test every three years. For women ages 21-65, they may recommend the Pap test and pelvic exam, combined with testing for human papilloma virus (HPV), every five years. Some types of HPV increase your risk of cervical cancer. Testing for HPV may also be done on women of any age who have unclear Pap test results.  Other health care providers may not recommend any screening for nonpregnant women who are considered low risk for pelvic cancer and have no symptoms. Ask your health care provider if a screening pelvic exam is right for you.  If you have had past treatment for cervical cancer or a condition that could lead to cancer, you need Pap tests and screening for cancer for at least 20 years after your treatment. If Pap tests have been discontinued for you, your risk factors (such as having a new sexual partner) need to be  reassessed to determine if you should start having screenings again. Some women have medical problems that increase the chance of getting cervical cancer. In these cases, your health care provider may recommend that you have screening and Pap tests more often.  If you have a family history of uterine cancer or ovarian cancer, talk with your health care provider about genetic screening.  If you have vaginal bleeding after reaching menopause, tell your health care provider.  There are currently no reliable tests available to screen for ovarian cancer.  Lung Cancer Lung cancer screening is recommended for adults 68-5 years old who are at high risk for lung cancer because of a history of smoking. A yearly low-dose CT scan of the lungs is recommended if you:  Currently smoke.  Have a history of at least 30 pack-years of smoking and you currently smoke or have quit within the past 15 years. A pack-year is smoking an average of one pack of cigarettes per day for one year.  Yearly screening should:  Continue until it has been 15 years since you quit.  Stop if you develop a health problem that would prevent you from having lung cancer treatment.  Colorectal Cancer  This type of cancer can be detected and can often be prevented.  Routine colorectal cancer screening usually begins at  age 42 and continues through age 45.  If you have risk factors for colon cancer, your health care provider may recommend that you be screened at an earlier age.  If you have a family history of colorectal cancer, talk with your health care provider about genetic screening.  Your health care provider may also recommend using home test kits to check for hidden blood in your stool.  A small camera at the end of a tube can be used to examine your colon directly (sigmoidoscopy or colonoscopy). This is done to check for the earliest forms of colorectal cancer.  Direct examination of the colon should be repeated every  5-10 years until age 71. However, if early forms of precancerous polyps or small growths are found or if you have a family history or genetic risk for colorectal cancer, you may need to be screened more often.  Skin Cancer  Check your skin from head to toe regularly.  Monitor any moles. Be sure to tell your health care provider: ? About any new moles or changes in moles, especially if there is a change in a mole's shape or color. ? If you have a mole that is larger than the size of a pencil eraser.  If any of your family members has a history of skin cancer, especially at a young age, talk with your health care provider about genetic screening.  Always use sunscreen. Apply sunscreen liberally and repeatedly throughout the day.  Whenever you are outside, protect yourself by wearing long sleeves, pants, a wide-brimmed hat, and sunglasses.  What should I know about osteoporosis? Osteoporosis is a condition in which bone destruction happens more quickly than new bone creation. After menopause, you may be at an increased risk for osteoporosis. To help prevent osteoporosis or the bone fractures that can happen because of osteoporosis, the following is recommended:  If you are 46-71 years old, get at least 1,000 mg of calcium and at least 600 mg of vitamin D per day.  If you are older than age 55 but younger than age 65, get at least 1,200 mg of calcium and at least 600 mg of vitamin D per day.  If you are older than age 54, get at least 1,200 mg of calcium and at least 800 mg of vitamin D per day.  Smoking and excessive alcohol intake increase the risk of osteoporosis. Eat foods that are rich in calcium and vitamin D, and do weight-bearing exercises several times each week as directed by your health care provider. What should I know about how menopause affects my mental health? Depression may occur at any age, but it is more common as you become older. Common symptoms of depression  include:  Low or sad mood.  Changes in sleep patterns.  Changes in appetite or eating patterns.  Feeling an overall lack of motivation or enjoyment of activities that you previously enjoyed.  Frequent crying spells.  Talk with your health care provider if you think that you are experiencing depression. What should I know about immunizations? It is important that you get and maintain your immunizations. These include:  Tetanus, diphtheria, and pertussis (Tdap) booster vaccine.  Influenza every year before the flu season begins.  Pneumonia vaccine.  Shingles vaccine.  Your health care provider may also recommend other immunizations. This information is not intended to replace advice given to you by your health care provider. Make sure you discuss any questions you have with your health care provider. Document Released: 10/04/2005  Document Revised: 03/01/2016 Document Reviewed: 05/16/2015 Elsevier Interactive Patient Education  2018 [employer]  

## 2018-05-06 ENCOUNTER — Other Ambulatory Visit: Payer: Self-pay | Admitting: Physician Assistant

## 2018-05-07 ENCOUNTER — Telehealth: Payer: Self-pay | Admitting: *Deleted

## 2018-05-07 NOTE — Telephone Encounter (Signed)
Did review the chart and saw when the patient saw Nicoletta Ba PA, on 04-15-2018 change Zofran to twice daily as needed. Sent refills with this dosage and directions.

## 2018-05-07 NOTE — Telephone Encounter (Signed)
LM for the patient to call me and let me know if she still needs the Zofran 4 mg as needed twice daily.

## 2018-05-18 ENCOUNTER — Other Ambulatory Visit: Payer: Self-pay | Admitting: Endocrinology

## 2018-05-26 ENCOUNTER — Telehealth: Payer: Self-pay | Admitting: Physician Assistant

## 2018-05-26 NOTE — Telephone Encounter (Signed)
Please switch to 20mg  tablets of pepcid, 1-2 tablets at bedtime as needed. Thanks

## 2018-05-26 NOTE — Telephone Encounter (Signed)
The patient is interested in switching to Pepcid. She takes Zantac 300 mg at bedtime ( takes 2 of the 150 mg tablets. What dosage Pepcid do you want her on?

## 2018-05-27 ENCOUNTER — Other Ambulatory Visit: Payer: Self-pay

## 2018-05-27 DIAGNOSIS — K219 Gastro-esophageal reflux disease without esophagitis: Secondary | ICD-10-CM

## 2018-05-27 MED ORDER — FAMOTIDINE 20 MG PO TABS: ORAL_TABLET | ORAL | 3 refills | Status: DC

## 2018-06-01 ENCOUNTER — Other Ambulatory Visit: Payer: Self-pay | Admitting: Gynecology

## 2018-06-01 ENCOUNTER — Other Ambulatory Visit: Payer: Self-pay | Admitting: Women's Health

## 2018-06-01 ENCOUNTER — Ambulatory Visit (INDEPENDENT_AMBULATORY_CARE_PROVIDER_SITE_OTHER): Payer: PPO

## 2018-06-01 DIAGNOSIS — M810 Age-related osteoporosis without current pathological fracture: Secondary | ICD-10-CM | POA: Diagnosis not present

## 2018-06-01 DIAGNOSIS — Z13820 Encounter for screening for osteoporosis: Secondary | ICD-10-CM

## 2018-06-01 DIAGNOSIS — J301 Allergic rhinitis due to pollen: Secondary | ICD-10-CM | POA: Diagnosis not present

## 2018-06-01 DIAGNOSIS — J3089 Other allergic rhinitis: Secondary | ICD-10-CM | POA: Diagnosis not present

## 2018-06-01 DIAGNOSIS — Z1231 Encounter for screening mammogram for malignant neoplasm of breast: Secondary | ICD-10-CM

## 2018-06-02 ENCOUNTER — Ambulatory Visit
Admission: RE | Admit: 2018-06-02 | Discharge: 2018-06-02 | Disposition: A | Discharge status: Home / Self Care | Payer: PPO | Source: Ambulatory Visit | Attending: Women's Health | Admitting: Women's Health

## 2018-06-02 DIAGNOSIS — Z1231 Encounter for screening mammogram for malignant neoplasm of breast: Secondary | ICD-10-CM | POA: Diagnosis not present

## 2018-06-09 ENCOUNTER — Other Ambulatory Visit: Payer: Self-pay | Admitting: Endocrinology

## 2018-06-10 ENCOUNTER — Telehealth: Payer: Self-pay | Admitting: Neurology

## 2018-06-10 NOTE — Telephone Encounter (Signed)
I called [employer] and spoke with representative Evangela M. She stated that no NPR for either code if done in office. Ref#[id number] 06/10/18 @ 8:55. DW

## 2018-06-12 ENCOUNTER — Other Ambulatory Visit: Payer: Self-pay | Admitting: Endocrinology

## 2018-06-16 ENCOUNTER — Telehealth: Payer: Self-pay | Admitting: Neurology

## 2018-06-16 ENCOUNTER — Ambulatory Visit (INDEPENDENT_AMBULATORY_CARE_PROVIDER_SITE_OTHER): Payer: PPO | Admitting: Neurology

## 2018-06-16 DIAGNOSIS — G43709 Chronic migraine without aura, not intractable, without status migrainosus: Secondary | ICD-10-CM

## 2018-06-16 NOTE — Progress Notes (Signed)
Last 3 months, no migraines, a few headaches. Significant improvement from baseline of daily headaches and 15 migraine days a month.   + orb oculi, +LS,  + temples, + left masseter only  Reviewed orally with patient, additionally signature is on file:  Botulism toxin has been approved by the Federal drug administration for treatment of chronic migraine. Botulism toxin does not cure chronic migraine and it may not be effective in some patients.  The administration of botulism toxin is accomplished by injecting a small amount of toxin into the muscles of the neck and head. Dosage must be titrated for each individual. Any benefits resulting from botulism toxin tend to wear off after 3 months with a repeat injection required if benefit is to be maintained. Injections are usually done every 3-4 months with maximum effect peak achieved by about 2 or 3 weeks. Botulism toxin is expensive and you should be sure of what costs you will incur resulting from the injection.  The side effects of botulism toxin use for chronic migraine may include:   -Transient, and usually mild, facial weakness with facial injections  -Transient, and usually mild, head or neck weakness with head/neck injections  -Reduction or loss of forehead facial animation due to forehead muscle weakness  -Eyelid drooping  -Dry eye  -Pain at the site of injection or bruising at the site of injection  -Double vision  -Potential unknown long term risks  Contraindications: You should not have Botox if you are pregnant, nursing, allergic to albumin, have an infection, skin condition, or muscle weakness at the site of the injection, or have myasthenia gravis, Lambert-Eaton syndrome, or ALS.  It is also possible that as with any injection, there may be an allergic reaction or no effect from the medication. Reduced effectiveness after repeated injections is sometimes seen and rarely infection at the injection site may occur. All care  will be taken to prevent these side effects. If therapy is given over a long time, atrophy and wasting in the muscle injected may occur. Occasionally the patient's become refractory to treatment because they develop antibodies to the toxin. In this event, therapy needs to be modified.  I have read the above information and consent to the administration of botulism toxin.    BOTOX PROCEDURE NOTE FOR MIGRAINE HEADACHE    Contraindications and precautions discussed with patient(above). Aseptic procedure was observed and patient tolerated procedure. Procedure performed by Dr. Georgia Dom  The condition has existed for more than 6 months, and pt does not have a diagnosis of ALS, Myasthenia Gravis or Lambert-Eaton Syndrome.  Risks and benefits of injections discussed and pt agrees to proceed with the procedure.  Written consent obtained  These injections are medically necessary. Pt  receives good benefits from these injections. These injections do not cause sedations or hallucinations which the oral therapies may cause.  Description of procedure:  The patient was placed in a sitting position. The standard protocol was used for Botox as follows, with 5 units of Botox injected at each site:   -Procerus muscle, midline injection  -Corrugator muscle, bilateral injection  -Frontalis muscle, bilateral injection, with 2 sites each side, medial injection was performed in the upper one third of the frontalis muscle, in the region vertical from the medial inferior edge of the superior orbital rim. The lateral injection was again in the upper one third of the forehead vertically above the lateral limbus of the cornea, 1.5 cm lateral to the medial injection  site.  - Levator Scapulae: 5 units bilaterally  -Temporalis muscle injection, 5 sites, bilaterally. The first injection was 3 cm above the tragus of the ear, second injection site was 1.5 cm to 3 cm up from the first injection site in line with the  tragus of the ear. The third injection site was 1.5-3 cm forward between the first 2 injection sites. The fourth injection site was 1.5 cm posterior to the second injection site. 5th site laterally in the temporalis  muscleat the level of the outer canthus.  - Patient feels her clenching is a trigger for headaches. +5 units masseter left  - Patient feels the migraines are centered around the eyes +5 units bilaterally at the outer canthus in the orbicularis occuli  -Occipitalis muscle injection, 3 sites, bilaterally. The first injection was done one half way between the occipital protuberance and the tip of the mastoid process behind the ear. The second injection site was done lateral and superior to the first, 1 fingerbreadth from the first injection. The third injection site was 1 fingerbreadth superiorly and medially from the first injection site.  -Cervical paraspinal muscle injection, 2 sites, bilateral knee first injection site was 1 cm from the midline of the cervical spine, 3 cm inferior to the lower border of the occipital protuberance. The second injection site was 1.5 cm superiorly and laterally to the first injection site.  -Trapezius muscle injection was performed at 3 sites, bilaterally. The first injection site was in the upper trapezius muscle halfway between the inflection point of the neck, and the acromion. The second injection site was one half way between the acromion and the first injection site. The third injection was done between the first injection site and the inflection point of the neck.   Will return for repeat injection in 3 months.   A 200 unit sof Botox was used, any Botox not injected was wasted. The patient tolerated the procedure well, there were no complications of the above procedure.

## 2018-06-16 NOTE — Telephone Encounter (Signed)
12 wk Botox inj °

## 2018-06-16 NOTE — Progress Notes (Signed)
Botox- 100 units x 2 vials Lot: [id number] Expiration: 11/2020 NDC: 4628-6381-77  Bacteriostatic 0.9% Sodium Chloride- 30mL total Lot: [id number] Expiration: 05/27/2019 NDC: 0383-3383-29  Dx: V91.660 B/B

## 2018-06-16 NOTE — Telephone Encounter (Signed)
Andee Poles, can you please call patient and get her scheduled for her next botox please? Thank you!   Hinton Dyer, patient pays approx $300 for every botox. Are there any patient assistance programs for botox that you know of? Is so please let me know and we can call Roanna to discuss.  Thanks!

## 2018-06-17 NOTE — Telephone Encounter (Signed)
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 Unfortunately they do not offer patient assistance for any medicare plans.

## 2018-06-17 NOTE — Telephone Encounter (Signed)
I called and scheduled the patient.  °

## 2018-06-23 DIAGNOSIS — G4733 Obstructive sleep apnea (adult) (pediatric): Secondary | ICD-10-CM | POA: Diagnosis not present

## 2018-07-11 ENCOUNTER — Other Ambulatory Visit: Payer: Self-pay | Admitting: Endocrinology

## 2018-07-13 ENCOUNTER — Encounter: Payer: Self-pay | Admitting: Gynecology

## 2018-07-13 ENCOUNTER — Ambulatory Visit: Payer: PPO | Admitting: Gynecology

## 2018-07-13 VITALS — BP 118/78

## 2018-07-13 DIAGNOSIS — M810 Age-related osteoporosis without current pathological fracture: Secondary | ICD-10-CM | POA: Diagnosis not present

## 2018-07-13 NOTE — Patient Instructions (Signed)
The office will call you to initiate the Prolia medication.

## 2018-07-13 NOTE — Progress Notes (Signed)
    [name] [birth year] [id number]        77 y.o.  G3P3003 presents with her husband to discuss her most recent DEXA T score -2.9 AP spine with 16% AP spine, 17% left hip and 19% right hip losses from her 2014 study.  7% loss at the AP spine from her 2017 study.  Hips appear relatively stable.  Past medical history,surgical history, problem list, medications, allergies, family history and social history were all reviewed and documented in the EPIC chart.  Directed ROS with pertinent positives and negatives documented in the history of present illness/assessment and plan.  Exam: Vitals:   07/13/18 1527  BP: 118/78   General appearance:  Normal   Assessment/Plan:  77 y.o. G3P3003 with osteoporosis and a 16 to 19% bone loss at various sites from her 2014 study.  A 7% loss in the AP spine from her 2017 study.  Actively being treated for GERD.  Currently on a number of oral medications.  We discussed her increased risk of fracture based on her study.  We discussed weightbearing exercise on a regular basis as well as adequate calcium and vitamin D intake.  Medication options were reviewed to include bisphosphate's and Prolia.  Currently on 0.25 mg estradiol patch and do not feel Evista appropriate.  Also at this point do not feel Evinity or Forteo appropriate until trial of 1 of the others.  I discussed the risks and side effects of each choice to include exacerbation of GERD, atypical fractures, osteonecrosis of the jaw, infections and rashes.  She has had a lot of issues with GERD and at this point feels Prolia would be a better choice which I concur.  We will go ahead and initiate with Prolia every 6 months and plan on follow-up DEXA in 2 years.  I spent a total of 25 face-to-face minutes with the patient, over 50% was spent counseling and coordination of care.     Anastasio Auerbach MD, 4:00 PM 07/13/2018

## 2018-07-14 ENCOUNTER — Telehealth: Payer: Self-pay | Admitting: *Deleted

## 2018-07-14 NOTE — Telephone Encounter (Signed)
-----   Message from Anastasio Auerbach, MD sent at 07/13/2018  4:06 PM EST ----- Initiate Prolia.  Diagnosis osteoporosis in patient actively being treated for GERD

## 2018-07-14 NOTE — Telephone Encounter (Signed)
Prolia insurance verification has been sent awaiting Summary of benefits  

## 2018-07-15 ENCOUNTER — Ambulatory Visit (INDEPENDENT_AMBULATORY_CARE_PROVIDER_SITE_OTHER): Payer: PPO | Admitting: Internal Medicine

## 2018-07-15 ENCOUNTER — Other Ambulatory Visit: Payer: Self-pay | Admitting: Physician Assistant

## 2018-07-15 ENCOUNTER — Encounter: Payer: Self-pay | Admitting: Internal Medicine

## 2018-07-15 DIAGNOSIS — Z6830 Body mass index (BMI) 30.0-30.9, adult: Secondary | ICD-10-CM | POA: Diagnosis not present

## 2018-07-15 DIAGNOSIS — E118 Type 2 diabetes mellitus with unspecified complications: Secondary | ICD-10-CM | POA: Diagnosis not present

## 2018-07-15 DIAGNOSIS — G8929 Other chronic pain: Secondary | ICD-10-CM

## 2018-07-15 DIAGNOSIS — I119 Hypertensive heart disease without heart failure: Secondary | ICD-10-CM | POA: Diagnosis not present

## 2018-07-15 DIAGNOSIS — F411 Generalized anxiety disorder: Secondary | ICD-10-CM

## 2018-07-15 DIAGNOSIS — K589 Irritable bowel syndrome without diarrhea: Secondary | ICD-10-CM | POA: Diagnosis not present

## 2018-07-15 DIAGNOSIS — E6609 Other obesity due to excess calories: Secondary | ICD-10-CM | POA: Diagnosis not present

## 2018-07-15 DIAGNOSIS — M5440 Lumbago with sciatica, unspecified side: Secondary | ICD-10-CM | POA: Diagnosis not present

## 2018-07-15 DIAGNOSIS — M810 Age-related osteoporosis without current pathological fracture: Secondary | ICD-10-CM | POA: Diagnosis not present

## 2018-07-15 MED ORDER — TRAMADOL-ACETAMINOPHEN 37.5-325 MG PO TABS: 1.0000 | ORAL_TABLET | Freq: Three times a day (TID) | ORAL | 3 refills | Status: DC | PRN

## 2018-07-15 MED ORDER — ZOSTER VAC RECOMB ADJUVANTED 50 MCG/0.5ML IM SUSR: 0.5000 mL | Freq: Once | INTRAMUSCULAR | 1 refills | Status: AC

## 2018-07-15 NOTE — Assessment & Plan Note (Signed)
Dr Toy Care On prn Xanax

## 2018-07-15 NOTE — Assessment & Plan Note (Signed)
F/u w/GI 

## 2018-07-15 NOTE — Assessment & Plan Note (Signed)
Mild Furosemide

## 2018-07-15 NOTE — Patient Instructions (Addendum)
Is chair yoga good for osteoporosis?   Yoga can be beneficial for people with osteoporosis. A small study in 2009 found that practicing yoga can actually increase bone density if done consistently and properly. Yoga also improves balance and flexibility, which can prevent falls and therefore prevent fractures.Jul 22, 2016   Cardiac CT calcium scoring test $150   Computed tomography, more commonly known as a CT or CAT scan, is a diagnostic medical imaging test. Like traditional x-rays, it produces multiple images or pictures of the inside of the body. The cross-sectional images generated during a CT scan can be reformatted in multiple planes. They can even generate three-dimensional images. These images can be viewed on a computer monitor, printed on film or by a 3D printer, or transferred to a CD or DVD. CT images of internal organs, bones, soft tissue and blood vessels provide greater detail than traditional x-rays, particularly of soft tissues and blood vessels. A cardiac CT scan for coronary calcium is a non-invasive way of obtaining information about the presence, location and extent of calcified plaque in the coronary arteries-the vessels that supply oxygen-containing blood to the heart muscle. Calcified plaque results when there is a build-up of fat and other substances under the inner layer of the artery. This material can calcify which signals the presence of atherosclerosis, a disease of the vessel wall, also called coronary artery disease (CAD). People with this disease have an increased risk for heart attacks. In addition, over time, progression of plaque build up (CAD) can narrow the arteries or even close off blood flow to the heart. The result may be chest pain, sometimes called "angina," or a heart attack. Because calcium is a marker of CAD, the amount of calcium detected on a cardiac CT scan is a helpful prognostic tool. The findings on cardiac CT are expressed as a calcium score. Another  name for this test is coronary artery calcium scoring.  What are some common uses of the procedure? The goal of cardiac CT scan for calcium scoring is to determine if CAD is present and to what extent, even if there are no symptoms. It is a screening study that may be recommended by a physician for patients with risk factors for CAD but no clinical symptoms. The major risk factors for CAD are: . high blood cholesterol levels  . family history of heart attacks  . diabetes  . high blood pressure  . cigarette smoking  . overweight or obese  . physical inactivity   A negative cardiac CT scan for calcium scoring shows no calcification within the coronary arteries. This suggests that CAD is absent or so minimal it cannot be seen by this technique. The chance of having a heart attack over the next two to five years is very low under these circumstances. A positive test means that CAD is present, regardless of whether or not the patient is experiencing any symptoms. The amount of calcification-expressed as the calcium score-may help to predict the likelihood of a myocardial infarction (heart attack) in the coming years and helps your medical doctor or cardiologist decide whether the patient may need to take preventive medicine or undertake other measures such as diet and exercise to lower the risk for heart attack. The extent of CAD is graded according to your calcium score:  Calcium Score  Presence of CAD  0 No evidence of CAD   1-10 Minimal evidence of CAD  11-100 Mild evidence of CAD  101-400 Moderate evidence of CAD  Over 400 Extensive evidence of CAD

## 2018-07-15 NOTE — Progress Notes (Signed)
Subjective:  Patient ID: [name], female    DOB: 06/23/1941  Age: 77 y.o. MRN: [id number]  CC: No chief complaint on file.   HPI EMMANUEL ERCOLE presents for osteoporosis, depression, LBP f/u  Outpatient Medications Prior to Visit  Medication Sig Dispense Refill  . acarbose (PRECOSE) 25 MG tablet 1 TABLET AT THE START OF SUPPER DAILY 90 tablet 1  . aspirin EC 81 MG tablet Take 1 tablet (81 mg total) by mouth daily. 100 tablet 3  . b complex vitamins tablet Take 1 tablet by mouth daily. Vitamin supplement    . BD PEN NEEDLE NANO U/F 32G X 4 MM MISC USE AS DIRECTED TWICE DAILY. 200 each 3  . Brexpiprazole (REXULTI) 1 MG TABS Take 1 mg by mouth. Every third day (Monday & Thursday)    . busPIRone (BUSPAR) 15 MG tablet Take 1 tablet by mouth 2 (two) times daily.    . calcium-vitamin D (OSCAL WITH D) 500-200 MG-UNIT per tablet Take 1 tablet by mouth daily with breakfast.    . cetirizine (ZYRTEC) 10 MG tablet Take 10 mg by mouth daily as needed for allergies.     . cholecalciferol (VITAMIN D) 1000 units tablet Take 2,000 Units by mouth daily.    Marland Kitchen dicyclomine (BENTYL) 10 MG capsule TAKE 1 CAPSULE (10 MG TOTAL) BY MOUTH 3 (THREE) TIMES DAILY BEFORE MEALS. 270 capsule 1  . estradiol (CLIMARA - DOSED IN MG/24 HR) 0.025 mg/24hr patch Place 1 patch (0.025 mg total) onto the skin once a week. APPLY 1 PATCH ONTO SKIN ONCE WEEKLY 12 patch 4  . famotidine (PEPCID) 20 MG tablet 1 to 2 tablets at bedtime-replaces Ranitadine 180 tablet 3  . furosemide (LASIX) 20 MG tablet Take 1-2 tablets (20-40 mg total) by mouth daily. 60 tablet 5  . L-Methylfolate (DEPLIN) 15 MG TABS Take 15 mg by mouth daily. For treatment of depression    . Liniments (SALONPAS PAIN RELIEF PATCH EX) Apply 1 patch topically at bedtime.    Marland Kitchen LORazepam (ATIVAN) 1 MG tablet Take 1 mg by mouth 4 (four) times daily as needed for anxiety.   2  . lovastatin (MEVACOR) 20 MG tablet Take 1 tablet (20 mg total) by mouth at bedtime. 90  tablet 3  . Magnesium 500 MG CAPS Take 1 capsule by mouth daily.    . metFORMIN (GLUCOPHAGE) 500 MG tablet Take 1 in am and 2 with dinner 270 tablet 3  . Multiple Vitamins-Minerals (MULTIVITAMIN,TX-MINERALS) tablet Take 1 tablet by mouth daily.     . ondansetron (ZOFRAN) 4 MG tablet Take 4 mg by mouth every 8 (eight) hours as needed for nausea or vomiting.    . ondansetron (ZOFRAN) 4 MG tablet TAKE 1 TAB BY MOUTH EVERY MORNING AND EVENING as needed. 60 tablet 3  . ONETOUCH DELICA LANCETS 78G MISC USE UP TO 4 TIMES A DAY 200 each 3  . ONETOUCH VERIO test strip USE TO CHECK BLOOD SUGARS 5 TIMES DAILY. 450 each 3  . pantoprazole (PROTONIX) 40 MG tablet Take 1 tablet (40 mg total) by mouth 2 (two) times daily. 180 tablet 3  . pioglitazone (ACTOS) 45 MG tablet TAKE 1 TABLET (45 MG TOTAL) BY MOUTH DAILY. 90 tablet 3  . ranitidine (ZANTAC) 150 MG tablet Take 1 tablet (150 mg total) by mouth at bedtime. Ranitidine 150mg  by mouth take 2 take at bedtime. 60 tablet 11  . Testosterone POWD APPLY A TINY PEA-SIZED AMOUNT ONCE A DAY. Doyline  g 1  . traMADol-acetaminophen (ULTRACET) 37.5-325 MG tablet Take 1 tablet by mouth every 8 (eight) hours as needed. 90 tablet 3  . TRINTELLIX 20 MG TABS Take 20 mg by mouth daily.  2  . VICTOZA 18 MG/3ML SOPN INJECT 1.2MG  INTO MUSCLE DAILY 12 pen 2  . VICTOZA 18 MG/3ML SOPN INJECT 1.2MG  INTO MUSCLE DAILY 12 pen 1  . VOLTAREN 1 % GEL APPLY 2 GRAMS TO KNEE 2-3 TIMES DAILY PRN  1   Facility-Administered Medications Prior to Visit  Medication Dose Route Frequency Provider Last Rate Last Dose  . 0.9 %  sodium chloride infusion  500 mL Intravenous Once Ladene Artist, MD        ROS: Review of Systems  Constitutional: Positive for fatigue. Negative for activity change, appetite change, chills and unexpected weight change.  HENT: Negative for congestion, mouth sores and sinus pressure.   Eyes: Negative for visual disturbance.  Respiratory: Negative for cough and chest  tightness.   Gastrointestinal: Negative for abdominal pain and nausea.  Genitourinary: Negative for difficulty urinating, frequency and vaginal pain.  Musculoskeletal: Positive for arthralgias, back pain and gait problem.  Skin: Negative for pallor and rash.  Neurological: Positive for dizziness and weakness. Negative for tremors, numbness and headaches.  Psychiatric/Behavioral: Positive for decreased concentration and dysphoric mood. Negative for confusion, sleep disturbance and suicidal ideas. The patient is nervous/anxious.     Objective:  BP 128/78 (BP Location: Left Arm, Patient Position: Sitting, Cuff Size: Normal)   Pulse (!) 102   Temp 98 F (36.7 C) (Oral)   Ht 5\' 2"  (1.575 m)   Wt 146 lb (66.2 kg)   SpO2 95%   BMI 26.70 kg/m   BP Readings from Last 3 Encounters:  07/15/18 128/78  07/13/18 118/78  04/15/18 118/66    Wt Readings from Last 3 Encounters:  07/15/18 146 lb (66.2 kg)  05/05/18 144 lb (65.3 kg)  04/15/18 143 lb (64.9 kg)    Physical Exam  Constitutional: She appears well-developed. No distress.  HENT:  Head: Normocephalic.  Right Ear: External ear normal.  Left Ear: External ear normal.  Nose: Nose normal.  Mouth/Throat: Oropharynx is clear and moist.  Eyes: Pupils are equal, round, and reactive to light. Conjunctivae are normal. Right eye exhibits no discharge. Left eye exhibits no discharge.  Neck: Normal range of motion. Neck supple. No JVD present. No tracheal deviation present. No thyromegaly present.  Cardiovascular: Normal rate, regular rhythm and normal heart sounds.  Pulmonary/Chest: No stridor. No respiratory distress. She has no wheezes.  Abdominal: Soft. Bowel sounds are normal. She exhibits no distension and no mass. There is no tenderness. There is no rebound and no guarding.  Musculoskeletal: She exhibits tenderness. She exhibits no edema.  Lymphadenopathy:    She has no cervical adenopathy.  Neurological: She displays normal reflexes.  No cranial nerve deficit. She exhibits normal muscle tone. Coordination normal.  Skin: No rash noted. No erythema.  Psychiatric: She has a normal mood and affect. Her behavior is normal. Judgment and thought content normal.  LS tender  Lab Results  Component Value Date   WBC 7.1 03/13/2018   HGB 10.8 (L) 03/13/2018   HCT 33.2 (L) 03/13/2018   PLT 412 03/13/2018   GLUCOSE 133 (H) 03/30/2018   CHOL 129 09/29/2017   TRIG 58.0 09/29/2017   HDL 77.60 09/29/2017   LDLCALC 39 09/29/2017   ALT 17 03/13/2018   AST 28 03/13/2018   NA 133 (L) 03/30/2018  K 4.7 03/30/2018   CL 95 (L) 03/30/2018   CREATININE 1.17 03/30/2018   BUN 18 03/30/2018   CO2 32 03/30/2018   TSH 1.29 12/02/2017   INR 0.93 11/02/2013   HGBA1C 6.8 (H) 03/30/2018   MICROALBUR 1.8 09/29/2017    Mm 3d Screen Breast Bilateral  Result Date: 06/02/2018 CLINICAL DATA:  Screening. EXAM: DIGITAL SCREENING BILATERAL MAMMOGRAM WITH TOMO AND CAD COMPARISON:  Previous exam(s). ACR Breast Density Category c: The breast tissue is heterogeneously dense, which may obscure small masses. FINDINGS: There are no findings suspicious for malignancy. Images were processed with CAD. IMPRESSION: No mammographic evidence of malignancy. A result letter of this screening mammogram will be mailed directly to the patient. RECOMMENDATION: Screening mammogram in one year. (Code:SM-B-01Y) BI-RADS CATEGORY  1: Negative. Electronically Signed   By: Lovey Newcomer M.D.   On: 06/02/2018 16:32    Assessment & Plan:   There are no diagnoses linked to this encounter.   No orders of the defined types were placed in this encounter.    Follow-up: No follow-ups on file.  Walker Kehr, MD

## 2018-07-15 NOTE — Assessment & Plan Note (Signed)
F/u w/Dr Dahlia Byes, Metformin, Lantus - d/c, Actos, Acarbose

## 2018-07-15 NOTE — Assessment & Plan Note (Signed)
Ultracet prn  Potential benefits of a long term Tramadol/opioids use as well as potential risks (i.e. addiction risk, apnea etc) and complications (i.e. Somnolence, constipation and others) were explained to the patient and were aknowledged.

## 2018-07-15 NOTE — Assessment & Plan Note (Signed)
?  Prolia Vit D Chair yoga

## 2018-07-15 NOTE — Assessment & Plan Note (Signed)
Wt Readings from Last 3 Encounters:  07/15/18 146 lb (66.2 kg)  05/05/18 144 lb (65.3 kg)  04/15/18 143 lb (64.9 kg)

## 2018-07-20 ENCOUNTER — Other Ambulatory Visit: Payer: Self-pay | Admitting: Internal Medicine

## 2018-07-22 NOTE — Telephone Encounter (Signed)
Pt would like to talk to husband about cost of Prolia.  Pt will give me a call back 1st week of December   I will call back as a courtesy call 2nd week of December

## 2018-07-22 NOTE — Telephone Encounter (Addendum)
Deductible  Amount Met  OOP MAX $3400 ($127mt)  Annual exam  05/14/18  Calcium 10.9            Date 03/30/18  Upcoming dental procedures NO  Prior Authorization needed NO  Pt estimated Cost $236  Appt 08/03/18 at 11:30   Coverage Details: 20% one dose and $20 admin fee

## 2018-08-03 ENCOUNTER — Ambulatory Visit (INDEPENDENT_AMBULATORY_CARE_PROVIDER_SITE_OTHER): Payer: PPO | Admitting: Anesthesiology

## 2018-08-03 ENCOUNTER — Other Ambulatory Visit (INDEPENDENT_AMBULATORY_CARE_PROVIDER_SITE_OTHER): Payer: PPO

## 2018-08-03 DIAGNOSIS — M810 Age-related osteoporosis without current pathological fracture: Secondary | ICD-10-CM | POA: Diagnosis not present

## 2018-08-03 DIAGNOSIS — E1165 Type 2 diabetes mellitus with hyperglycemia: Secondary | ICD-10-CM

## 2018-08-03 LAB — COMPREHENSIVE METABOLIC PANEL
ALT: 18 U/L (ref 0–35)
AST: 26 U/L (ref 0–37)
Albumin: 4.2 g/dL (ref 3.5–5.2)
Alkaline Phosphatase: 62 U/L (ref 39–117)
BUN: 16 mg/dL (ref 6–23)
CO2: 30 mEq/L (ref 19–32)
Calcium: 10 mg/dL (ref 8.4–10.5)
Chloride: 97 mEq/L (ref 96–112)
Creatinine, Ser: 1.12 mg/dL (ref 0.40–1.20)
GFR: 50.14 mL/min — ABNORMAL LOW (ref >60.00)
Glucose, Bld: 128 mg/dL — ABNORMAL HIGH (ref 70–99)
Potassium: 4.3 mEq/L (ref 3.5–5.1)
Sodium: 134 mEq/L — ABNORMAL LOW (ref 135–145)
Total Bilirubin: 0.4 mg/dL (ref 0.2–1.2)
Total Protein: 7 g/dL (ref 6.0–8.3)

## 2018-08-03 LAB — HEMOGLOBIN A1C: Hgb A1c MFr Bld: 6.9 % — ABNORMAL HIGH (ref 4.6–6.5)

## 2018-08-03 MED ORDER — DENOSUMAB 60 MG/ML ~~LOC~~ SOSY
60.0000 mg | PREFILLED_SYRINGE | Freq: Once | SUBCUTANEOUS | Status: AC
  Administered 2018-08-03: 60 mg via SUBCUTANEOUS

## 2018-08-06 ENCOUNTER — Ambulatory Visit: Payer: PPO | Admitting: Endocrinology

## 2018-08-06 ENCOUNTER — Encounter: Payer: Self-pay | Admitting: Endocrinology

## 2018-08-06 VITALS — BP 130/78 | HR 78 | Ht 62.0 in | Wt 145.8 lb

## 2018-08-06 DIAGNOSIS — E1165 Type 2 diabetes mellitus with hyperglycemia: Secondary | ICD-10-CM

## 2018-08-06 MED ORDER — REPAGLINIDE 2 MG PO TABS: 2.0000 mg | ORAL_TABLET | Freq: Two times a day (BID) | ORAL | 3 refills | Status: DC
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
## 2018-08-06 NOTE — Patient Instructions (Addendum)
Check blood sugars on waking up 2-3 days a week  Also check blood sugars about 2 hours after meals and do this after different meals by rotation  Recommended blood sugar levels on waking up are 90-130 and about 2 hours after meal is 130-160  Please bring your blood sugar monitor to each visit, thank you  Stop acarbose  Protein in am

## 2018-08-11 ENCOUNTER — Inpatient Hospital Stay: Admission: RE | Admit: 2018-08-11 | Payer: PPO | Source: Ambulatory Visit

## 2018-08-12 ENCOUNTER — Other Ambulatory Visit: Payer: Self-pay

## 2018-08-12 NOTE — Patient Outreach (Signed)
[facility]) Care Management  08/12/2018  [name] [birth year] [id number]  Nurse Call Line Referral Date: 08/10/18 Reason for Referral:  Nurse call line recommendation:  Attempt #1  Telephone call to patient regarding nurse call line referral.  Attempted listed home number and mobile number. Unable to reach patient. HIPAA compliant voice messages left with call back phone number.    PLAN: RNCM will attempt 2nd telephone call to patient within 4 business days.  RNCM will send patient outreach letter to attempt contact.   Quinn Plowman RN,BSN,CCM [facility] Telephonic  [phone]

## 2018-08-13 ENCOUNTER — Other Ambulatory Visit: Payer: Self-pay

## 2018-08-13 NOTE — Patient Outreach (Signed)
[facility]) Care Management  08/13/2018  [name] January 31, 1941 [id number]   Nurse Call Line Referral Date: 08/10/18 Reason for Referral: headache, flu like symptoms.  Nurse call line recommendation: follow up with primary care provider  Telephone call to patient regarding nurse call line referral. HIPAA verified with patient. Explained reason for call. Patient states she is feeling fine now. She states she never went to her doctor because she felt like the headache and flu like symptoms she was having just needed to ware out.  Patient states she is doing fine now and is not having any more of those symptoms.  RNCM advised patient to follow up with her doctor if she experiences any changes or worsens.  RNCM advised patient she can call nurse call line if she has any additional concerns.  Patient verbalized appreciation for call.   PLAN: RNCM will close due to patient being assessed and having no further needs.   Quinn Plowman RN,BSN,CCM [facility] Telephonic  [phone]

## 2018-08-27 ENCOUNTER — Other Ambulatory Visit: Payer: PPO

## 2018-08-28 NOTE — Telephone Encounter (Signed)
PROLIA GIVEN 08/03/18  NEXT INJECTION 02/02/18

## 2018-09-01 ENCOUNTER — Ambulatory Visit (INDEPENDENT_AMBULATORY_CARE_PROVIDER_SITE_OTHER)
Admission: RE | Admit: 2018-09-01 | Discharge: 2018-09-01 | Disposition: A | Discharge status: Home / Self Care | Payer: Self-pay | Source: Ambulatory Visit | Attending: Internal Medicine | Admitting: Internal Medicine

## 2018-09-01 DIAGNOSIS — E118 Type 2 diabetes mellitus with unspecified complications: Secondary | ICD-10-CM

## 2018-09-01 DIAGNOSIS — I119 Hypertensive heart disease without heart failure: Secondary | ICD-10-CM

## 2018-09-09 ENCOUNTER — Other Ambulatory Visit: Payer: Self-pay | Admitting: Physician Assistant

## 2018-09-10 ENCOUNTER — Telehealth: Payer: Self-pay | Admitting: Neurology

## 2018-09-10 NOTE — Telephone Encounter (Signed)
I called [employer] to check coverage and pre cert requirements. I spoke with Chrissie Noa, patient is covered but authorization is required. NPR. KZS#[id number]. DW

## 2018-09-14 ENCOUNTER — Encounter: Payer: Self-pay | Admitting: Neurology
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
## 2018-09-15 ENCOUNTER — Ambulatory Visit: Payer: PPO | Admitting: Nurse Practitioner

## 2018-09-15 ENCOUNTER — Encounter: Payer: Self-pay | Admitting: Nurse Practitioner

## 2018-09-15 ENCOUNTER — Other Ambulatory Visit: Payer: Self-pay | Admitting: Gastroenterology

## 2018-09-15 VITALS — BP 116/61 | HR 104 | Ht 62.0 in | Wt 143.8 lb

## 2018-09-15 DIAGNOSIS — Z9989 Dependence on other enabling machines and devices: Secondary | ICD-10-CM

## 2018-09-15 DIAGNOSIS — G4733 Obstructive sleep apnea (adult) (pediatric): Secondary | ICD-10-CM

## 2018-09-15 NOTE — Patient Instructions (Signed)
CPAP compliance 100% Need to check with [facility] about mask Follow-up yearly

## 2018-09-16 ENCOUNTER — Ambulatory Visit: Payer: PPO | Admitting: Neurology

## 2018-09-16 DIAGNOSIS — G43709 Chronic migraine without aura, not intractable, without status migrainosus: Secondary | ICD-10-CM

## 2018-09-16 NOTE — Progress Notes (Signed)
° °Consent Form °Botulism Toxin Injection For Chronic Migraine ° ° °Last 3 months, no migraines, a few headaches. Significant improvement from baseline of daily headaches and 15 migraine days a month.  ° °+ orb oculi, +LS,  + temples, + left masseter only ° °Reviewed orally with patient, additionally signature is on file: ° °Botulism toxin has been approved by the Federal drug administration for treatment of chronic migraine. Botulism toxin does not cure chronic migraine and it may not be effective in some patients. ° °The administration of botulism toxin is accomplished by injecting a small amount of toxin into the muscles of the neck and head. Dosage must be titrated for each individual. Any benefits resulting from botulism toxin tend to wear off after 3 months with a repeat injection required if benefit is to be maintained. Injections are usually done every 3-4 months with maximum effect peak achieved by about 2 or 3 weeks. Botulism toxin is expensive and you should be sure of what costs you will incur resulting from the injection. ° °The side effects of botulism toxin use for chronic migraine may include: ° ° -Transient, and usually mild, facial weakness with facial injections ° -Transient, and usually mild, head or neck weakness with head/neck injections ° -Reduction or loss of forehead facial animation due to forehead muscle weakness ° -Eyelid drooping ° -Dry eye ° -Pain at the site of injection or bruising at the site of injection ° -Double vision ° -Potential unknown long term risks ° °Contraindications: You should not have Botox if you are pregnant, nursing, allergic to albumin, have an infection, skin condition, or muscle weakness at the site of the injection, or have myasthenia gravis, Lambert-Eaton syndrome, or ALS. ° °It is also possible that as with any injection, there may be an allergic reaction or no effect from the medication. Reduced effectiveness after repeated injections is sometimes seen and  rarely infection at the injection site may occur. All care will be taken to prevent these side effects. If therapy is given over a long time, atrophy and wasting in the muscle injected may occur. Occasionally the patient's become refractory to treatment because they develop antibodies to the toxin. In this event, therapy needs to be modified. ° °I have read the above information and consent to the administration of botulism toxin. ° ° ° °BOTOX PROCEDURE NOTE FOR MIGRAINE HEADACHE ° ° ° °Contraindications and precautions discussed with patient(above). Aseptic procedure was observed and patient tolerated procedure. Procedure performed by Dr. Toni Tamra Koos ° °The condition has existed for more than 6 months, and pt does not have a diagnosis of ALS, Myasthenia Gravis or Lambert-Eaton Syndrome.  Risks and benefits of injections discussed and pt agrees to proceed with the procedure.  Written consent obtained ° °These injections are medically necessary. Pt  receives good benefits from these injections. These injections do not cause sedations or hallucinations which the oral therapies may cause. ° °Description of procedure: ° °The patient was placed in a sitting position. The standard protocol was used for Botox as follows, with 5 units of Botox injected at each site: ° ° °-Procerus muscle, midline injection ° °-Corrugator muscle, bilateral injection ° °-Frontalis muscle, bilateral injection, with 2 sites each side, medial injection was performed in the upper one third of the frontalis muscle, in the region vertical from the medial inferior edge of the superior orbital rim. The lateral injection was again in the upper one third of the forehead vertically above the lateral limbus of the cornea,   1.5 cm lateral to the medial injection site. ° °- Levator Scapulae: 5 units bilaterally ° °-Temporalis muscle injection, 5 sites, bilaterally. The first injection was 3 cm above the tragus of the ear, second injection site was 1.5 cm to  3 cm up from the first injection site in line with the tragus of the ear. The third injection site was 1.5-3 cm forward between the first 2 injection sites. The fourth injection site was 1.5 cm posterior to the second injection site. 5th site laterally in the temporalis  muscleat the level of the outer canthus. ° °- Patient feels her clenching is a trigger for headaches. +5 units masseter bilaterally  ° °- Patient feels the migraines are centered around the eyes +5 units bilaterally at the outer canthus in the orbicularis occuli ° °-Occipitalis muscle injection, 3 sites, bilaterally. The first injection was done one half way between the occipital protuberance and the tip of the mastoid process behind the ear. The second injection site was done lateral and superior to the first, 1 fingerbreadth from the first injection. The third injection site was 1 fingerbreadth superiorly and medially from the first injection site. ° °-Cervical paraspinal muscle injection, 2 sites, bilateral knee first injection site was 1 cm from the midline of the cervical spine, 3 cm inferior to the lower border of the occipital protuberance. The second injection site was 1.5 cm superiorly and laterally to the first injection site. ° °-Trapezius muscle injection was performed at 3 sites, bilaterally. The first injection site was in the upper trapezius muscle halfway between the inflection point of the neck, and the acromion. The second injection site was one half way between the acromion and the first injection site. The third injection was done between the first injection site and the inflection point of the neck. ° ° °Will return for repeat injection in 3 months. ° ° °A 200 unit sof Botox was used, any Botox not injected was wasted. The patient tolerated the procedure well, there were no complications of the above procedure. ° ° °

## 2018-09-16 NOTE — Progress Notes (Signed)
Botox- 100 units x 2 vials Lot: [id number] Expiration: 01/2021 NDC: 5146-0479-98  Bacteriostatic 0.9% Sodium Chloride- 57mL total Lot: [id number] Expiration: 05/27/2019 NDC: 2761-8485-92  Dx: N63.943 B/B

## 2018-09-22 DIAGNOSIS — J301 Allergic rhinitis due to pollen: Secondary | ICD-10-CM | POA: Diagnosis not present

## 2018-09-22 DIAGNOSIS — J3089 Other allergic rhinitis: Secondary | ICD-10-CM | POA: Diagnosis not present

## 2018-10-20 ENCOUNTER — Ambulatory Visit: Payer: PPO | Admitting: Internal Medicine

## 2018-10-21 DIAGNOSIS — J301 Allergic rhinitis due to pollen: Secondary | ICD-10-CM | POA: Diagnosis not present

## 2018-10-21 DIAGNOSIS — J3089 Other allergic rhinitis: Secondary | ICD-10-CM | POA: Diagnosis not present

## 2018-10-30 ENCOUNTER — Other Ambulatory Visit: Payer: PPO

## 2018-11-03 ENCOUNTER — Ambulatory Visit: Payer: PPO | Admitting: Endocrinology

## 2018-11-05 ENCOUNTER — Other Ambulatory Visit: Payer: Self-pay

## 2018-11-05 ENCOUNTER — Other Ambulatory Visit (INDEPENDENT_AMBULATORY_CARE_PROVIDER_SITE_OTHER): Payer: PPO

## 2018-11-05 DIAGNOSIS — J301 Allergic rhinitis due to pollen: Secondary | ICD-10-CM | POA: Diagnosis not present

## 2018-11-05 DIAGNOSIS — J3089 Other allergic rhinitis: Secondary | ICD-10-CM | POA: Diagnosis not present

## 2018-11-05 DIAGNOSIS — E1165 Type 2 diabetes mellitus with hyperglycemia: Secondary | ICD-10-CM | POA: Diagnosis not present

## 2018-11-05 LAB — URINALYSIS, ROUTINE W REFLEX MICROSCOPIC
Bilirubin Urine: NEGATIVE
Hgb urine dipstick: NEGATIVE
Ketones, ur: NEGATIVE
Nitrite: NEGATIVE
RBC / HPF: NONE SEEN (ref 0–?)
Specific Gravity, Urine: 1.01 (ref 1.000–1.030)
TOTAL PROTEIN, URINE-UPE24: NEGATIVE
Urine Glucose: NEGATIVE
Urobilinogen, UA: 0.2 (ref 0.0–1.0)
pH: 8 (ref 5.0–8.0)

## 2018-11-05 LAB — COMPREHENSIVE METABOLIC PANEL
ALK PHOS: 101 U/L (ref 39–117)
ALT: 75 U/L — ABNORMAL HIGH (ref 0–35)
AST: 129 U/L — ABNORMAL HIGH (ref 0–37)
Albumin: 4.1 g/dL (ref 3.5–5.2)
BUN: 17 mg/dL (ref 6–23)
CO2: 30 mEq/L (ref 19–32)
Calcium: 9.2 mg/dL (ref 8.4–10.5)
Chloride: 99 mEq/L (ref 96–112)
Creatinine, Ser: 1.18 mg/dL (ref 0.40–1.20)
GFR: 44.39 mL/min — ABNORMAL LOW (ref >60.00)
GLUCOSE: 126 mg/dL — AB (ref 70–99)
Potassium: 4.4 mEq/L (ref 3.5–5.1)
Sodium: 135 mEq/L (ref 135–145)
TOTAL PROTEIN: 6.9 g/dL (ref 6.0–8.3)
Total Bilirubin: 0.4 mg/dL (ref 0.2–1.2)

## 2018-11-05 LAB — MICROALBUMIN / CREATININE URINE RATIO
Creatinine,U: 91 mg/dL
Microalb Creat Ratio: 0.9 mg/g (ref 0.0–30.0)
Microalb, Ur: 0.8 mg/dL (ref 0.0–1.9)

## 2018-11-05 LAB — HEMOGLOBIN A1C: Hgb A1c MFr Bld: 6.8 % — ABNORMAL HIGH (ref 4.6–6.5)

## 2018-11-11 ENCOUNTER — Encounter: Payer: Self-pay | Admitting: Endocrinology

## 2018-11-11 ENCOUNTER — Ambulatory Visit: Payer: PPO | Admitting: Endocrinology

## 2018-11-11 ENCOUNTER — Other Ambulatory Visit: Payer: Self-pay

## 2018-11-11 VITALS — BP 122/72 | HR 84 | Temp 97.8°F | Ht 62.0 in | Wt 143.8 lb

## 2018-11-11 DIAGNOSIS — E1165 Type 2 diabetes mellitus with hyperglycemia: Secondary | ICD-10-CM | POA: Diagnosis not present

## 2018-11-11 NOTE — Patient Instructions (Signed)
Check blood sugars on waking up 1-2  days a week  Also check blood sugars about 2 hours after meals and do this after different meals by rotation  Recommended blood sugar levels on waking up are 90-130 and about 2 hours after meal is 130-160  Please bring your blood sugar monitor to each visit, thank you  
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
## 2018-11-17 DIAGNOSIS — G4733 Obstructive sleep apnea (adult) (pediatric): Secondary | ICD-10-CM | POA: Diagnosis not present

## 2018-11-23 ENCOUNTER — Encounter: Payer: Self-pay | Admitting: Internal Medicine

## 2018-11-23 ENCOUNTER — Ambulatory Visit (INDEPENDENT_AMBULATORY_CARE_PROVIDER_SITE_OTHER): Payer: PPO | Admitting: Internal Medicine

## 2018-11-23 DIAGNOSIS — R7989 Other specified abnormal findings of blood chemistry: Secondary | ICD-10-CM

## 2018-11-23 DIAGNOSIS — R42 Dizziness and giddiness: Secondary | ICD-10-CM | POA: Diagnosis not present

## 2018-11-23 DIAGNOSIS — R945 Abnormal results of liver function studies: Secondary | ICD-10-CM

## 2018-11-23 DIAGNOSIS — F3341 Major depressive disorder, recurrent, in partial remission: Secondary | ICD-10-CM

## 2018-11-23 DIAGNOSIS — F411 Generalized anxiety disorder: Secondary | ICD-10-CM | POA: Diagnosis not present

## 2018-11-23 DIAGNOSIS — R5383 Other fatigue: Secondary | ICD-10-CM

## 2018-11-23 NOTE — Assessment & Plan Note (Addendum)
The patient will continue with current antidepressants.  Follow-up with Dr. Toy Care Spend more time outside, exercise

## 2018-11-23 NOTE — Progress Notes (Signed)
Virtual Visit via Telephone Note  I connected with [name] on 11/23/18 at  2:20 PM EDT by telephone and verified that I am speaking with the correct person using two identifiers.   I discussed the limitations, risks, security and privacy concerns of performing an evaluation and management service by telephone and the availability of in person appointments. I also discussed with the patient that there may be a patient responsible charge related to this service. The patient expressed understanding and agreed to proceed.   History of Present Illness: [name] and [name] are on line.  [name] is complaining of elevated liver function tests for Dr. Ronnie Derby last lab work findings.  She continues to be tired (chronic).  She had another bout with vertigo that was helped with Apley's maneuver exercises and meclizine.   Observations/Objective: [name] is in no acute distress.  She looks well  Assessment and Plan:  See plan Follow Up Instructions:    I discussed the assessment and treatment plan with the patient. The patient was provided an opportunity to ask questions and all were answered. The patient agreed with the plan and demonstrated an understanding of the instructions.   The patient was advised to call back or seek an in-person evaluation if the symptoms worsen or if the condition fails to improve as anticipated.  I provided 15 minutes of non-face-to-face time during this encounter.   Walker Kehr, MD

## 2018-11-23 NOTE — Assessment & Plan Note (Addendum)
Follow-up with Dr. Toy Care Spend more time outside, exercise

## 2018-11-23 NOTE — Assessment & Plan Note (Signed)
Repeat liver function tests in 2 weeks.  We may need to hold lovastatin.  The patient denies using Tylenol.  No new other meds.

## 2018-11-23 NOTE — Assessment & Plan Note (Signed)
Meclizine as needed  The symptoms are better after Epley's maneuver

## 2018-11-23 NOTE — Assessment & Plan Note (Signed)
  Spend more time outside, exercise Shelley can try ginseng tea

## 2018-12-02 ENCOUNTER — Other Ambulatory Visit: Payer: Self-pay | Admitting: Endocrinology

## 2018-12-07 ENCOUNTER — Telehealth: Payer: Self-pay | Admitting: Endocrinology

## 2018-12-07 ENCOUNTER — Other Ambulatory Visit: Payer: Self-pay

## 2018-12-07 MED ORDER — ONETOUCH VERIO FLEX SYSTEM W/DEVICE KIT: PACK | 0 refills | Status: DC

## 2018-12-07 NOTE — Telephone Encounter (Signed)
Rx for new meter sent to pharmacy.

## 2018-12-07 NOTE — Telephone Encounter (Signed)
Per [facility], "Caller states that his wife needs a new diabetic testing kit. Does the office have an extra kit that they can give her? Her kit will not hold the lancet. She uses a [employer]."

## 2018-12-10 ENCOUNTER — Other Ambulatory Visit (INDEPENDENT_AMBULATORY_CARE_PROVIDER_SITE_OTHER): Payer: PPO

## 2018-12-10 DIAGNOSIS — J301 Allergic rhinitis due to pollen: Secondary | ICD-10-CM | POA: Diagnosis not present

## 2018-12-10 DIAGNOSIS — J3089 Other allergic rhinitis: Secondary | ICD-10-CM | POA: Diagnosis not present

## 2018-12-10 DIAGNOSIS — R945 Abnormal results of liver function studies: Secondary | ICD-10-CM | POA: Diagnosis not present

## 2018-12-10 DIAGNOSIS — R7989 Other specified abnormal findings of blood chemistry: Secondary | ICD-10-CM

## 2018-12-10 DIAGNOSIS — S0990XA Unspecified injury of head, initial encounter: Secondary | ICD-10-CM | POA: Diagnosis not present

## 2018-12-10 LAB — HEPATIC FUNCTION PANEL
ALT: 16 U/L (ref 0–35)
AST: 24 U/L (ref 0–37)
Albumin: 4 g/dL (ref 3.5–5.2)
Alkaline Phosphatase: 81 U/L (ref 39–117)
Bilirubin, Direct: 0.1 mg/dL (ref 0.0–0.3)
Total Bilirubin: 0.3 mg/dL (ref 0.2–1.2)
Total Protein: 7.2 g/dL (ref 6.0–8.3)

## 2018-12-12 ENCOUNTER — Other Ambulatory Visit: Payer: Self-pay | Admitting: Internal Medicine

## 2018-12-21 ENCOUNTER — Other Ambulatory Visit: Payer: Self-pay | Admitting: Internal Medicine

## 2018-12-22 ENCOUNTER — Ambulatory Visit: Payer: PPO | Admitting: Neurology

## 2019-01-07 ENCOUNTER — Telehealth: Payer: Self-pay | Admitting: Neurology

## 2019-01-07 NOTE — Telephone Encounter (Signed)
I called to reschedule her Botox apt but she did not answer so I left a VM asking her to return my call.

## 2019-01-20 ENCOUNTER — Telehealth: Payer: Self-pay | Admitting: *Deleted

## 2019-01-20 NOTE — Telephone Encounter (Signed)
Deductible Amount met  OOP MAX $3400 ($0 met)  Annual exam 05/14/18 [place]  Calcium  9.2           Date 11/05/2018  Upcoming dental procedures   Prior Authorization needed No per Health team adv reference # [id number]   Pt estimated Cost $222   Appt 02/10/2019 @ 2:00       Coverage Details: 20% one dose,20% admin fee

## 2019-01-26 ENCOUNTER — Other Ambulatory Visit: Payer: Self-pay

## 2019-01-26 ENCOUNTER — Ambulatory Visit (INDEPENDENT_AMBULATORY_CARE_PROVIDER_SITE_OTHER): Payer: PPO | Admitting: Neurology

## 2019-01-26 DIAGNOSIS — G43709 Chronic migraine without aura, not intractable, without status migrainosus: Secondary | ICD-10-CM

## 2019-01-26 NOTE — Progress Notes (Signed)
Botox- 100 units x 2 vials Lot: [id number] Expiration: 09/2021 NDC: 2671-2458-09  Bacteriostatic 0.9% Sodium Chloride- 26mL total Lot: [id number] Expiration: 05/27/2019 NDC: 5053-9767-34  Dx: L93.790 B/B

## 2019-01-26 NOTE — Progress Notes (Signed)
Consent Form Botulism Toxin Injection For Chronic Migraine   Last 3 months, no migraines, a few headaches. Significant improvement from baseline of daily headaches and 15 migraine days a month.   + orb oculi, +LS,  + temples, + left masseter only  Reviewed orally with patient, additionally signature is on file:  Botulism toxin has been approved by the Federal drug administration for treatment of chronic migraine. Botulism toxin does not cure chronic migraine and it may not be effective in some patients.  The administration of botulism toxin is accomplished by injecting a small amount of toxin into the muscles of the neck and head. Dosage must be titrated for each individual. Any benefits resulting from botulism toxin tend to wear off after 3 months with a repeat injection required if benefit is to be maintained. Injections are usually done every 3-4 months with maximum effect peak achieved by about 2 or 3 weeks. Botulism toxin is expensive and you should be sure of what costs you will incur resulting from the injection.  The side effects of botulism toxin use for chronic migraine may include:   -Transient, and usually mild, facial weakness with facial injections  -Transient, and usually mild, head or neck weakness with head/neck injections  -Reduction or loss of forehead facial animation due to forehead muscle weakness  -Eyelid drooping  -Dry eye  -Pain at the site of injection or bruising at the site of injection  -Double vision  -Potential unknown long term risks  Contraindications: You should not have Botox if you are pregnant, nursing, allergic to albumin, have an infection, skin condition, or muscle weakness at the site of the injection, or have myasthenia gravis, Lambert-Eaton syndrome, or ALS.  It is also possible that as with any injection, there may be an allergic reaction or no effect from the medication. Reduced effectiveness after repeated injections is sometimes seen and  rarely infection at the injection site may occur. All care will be taken to prevent these side effects. If therapy is given over a long time, atrophy and wasting in the muscle injected may occur. Occasionally the patient's become refractory to treatment because they develop antibodies to the toxin. In this event, therapy needs to be modified.  I have read the above information and consent to the administration of botulism toxin.    BOTOX PROCEDURE NOTE FOR MIGRAINE HEADACHE    Contraindications and precautions discussed with patient(above). Aseptic procedure was observed and patient tolerated procedure. Procedure performed by Dr. Georgia Dom  The condition has existed for more than 6 months, and pt does not have a diagnosis of ALS, Myasthenia Gravis or Lambert-Eaton Syndrome.  Risks and benefits of injections discussed and pt agrees to proceed with the procedure.  Written consent obtained  These injections are medically necessary. Pt  receives good benefits from these injections. These injections do not cause sedations or hallucinations which the oral therapies may cause.  Description of procedure:  The patient was placed in a sitting position. The standard protocol was used for Botox as follows, with 5 units of Botox injected at each site:   -Procerus muscle, midline injection  -Corrugator muscle, bilateral injection  -Frontalis muscle, bilateral injection, with 2 sites each side, medial injection was performed in the upper one third of the frontalis muscle, in the region vertical from the medial inferior edge of the superior orbital rim. The lateral injection was again in the upper one third of the forehead vertically above the lateral limbus of the cornea,  1.5 cm lateral to the medial injection site.  - Levator Scapulae: 5 units bilaterally  -Temporalis muscle injection, 5 sites, bilaterally. The first injection was 3 cm above the tragus of the ear, second injection site was 1.5 cm to  3 cm up from the first injection site in line with the tragus of the ear. The third injection site was 1.5-3 cm forward between the first 2 injection sites. The fourth injection site was 1.5 cm posterior to the second injection site. 5th site laterally in the temporalis  muscleat the level of the outer canthus.  - Patient feels her clenching is a trigger for headaches. +5 units masseter bilaterally   - Patient feels the migraines are centered around the eyes +5 units bilaterally at the outer canthus in the orbicularis occuli  -Occipitalis muscle injection, 3 sites, bilaterally. The first injection was done one half way between the occipital protuberance and the tip of the mastoid process behind the ear. The second injection site was done lateral and superior to the first, 1 fingerbreadth from the first injection. The third injection site was 1 fingerbreadth superiorly and medially from the first injection site.  -Cervical paraspinal muscle injection, 2 sites, bilateral knee first injection site was 1 cm from the midline of the cervical spine, 3 cm inferior to the lower border of the occipital protuberance. The second injection site was 1.5 cm superiorly and laterally to the first injection site.  -Trapezius muscle injection was performed at 3 sites, bilaterally. The first injection site was in the upper trapezius muscle halfway between the inflection point of the neck, and the acromion. The second injection site was one half way between the acromion and the first injection site. The third injection was done between the first injection site and the inflection point of the neck.   Will return for repeat injection in 3 months.   A 200 unit sof Botox was used, any Botox not injected was wasted. The patient tolerated the procedure well, there were no complications of the above procedure.

## 2019-02-09 ENCOUNTER — Other Ambulatory Visit: Payer: PPO

## 2019-02-09 ENCOUNTER — Other Ambulatory Visit: Payer: Self-pay | Admitting: Endocrinology

## 2019-02-10 ENCOUNTER — Ambulatory Visit: Payer: PPO

## 2019-02-11 ENCOUNTER — Ambulatory Visit: Payer: PPO | Admitting: Endocrinology

## 2019-02-16 ENCOUNTER — Ambulatory Visit (INDEPENDENT_AMBULATORY_CARE_PROVIDER_SITE_OTHER): Payer: PPO | Admitting: *Deleted

## 2019-02-16 ENCOUNTER — Other Ambulatory Visit: Payer: Self-pay

## 2019-02-16 DIAGNOSIS — M810 Age-related osteoporosis without current pathological fracture: Secondary | ICD-10-CM | POA: Diagnosis not present

## 2019-02-16 DIAGNOSIS — J301 Allergic rhinitis due to pollen: Secondary | ICD-10-CM | POA: Diagnosis not present

## 2019-02-16 DIAGNOSIS — J3089 Other allergic rhinitis: Secondary | ICD-10-CM | POA: Diagnosis not present

## 2019-02-16 DIAGNOSIS — J3081 Allergic rhinitis due to animal (cat) (dog) hair and dander: Secondary | ICD-10-CM | POA: Diagnosis not present

## 2019-02-16 MED ORDER — MEDROXYPROGESTERONE ACETATE 150 MG/ML IM SUSP: 150.0000 mg | Freq: Once | INTRAMUSCULAR | Status: DC

## 2019-02-16 MED ORDER — DENOSUMAB 60 MG/ML ~~LOC~~ SOSY
60.0000 mg | PREFILLED_SYRINGE | Freq: Once | SUBCUTANEOUS | Status: AC
  Administered 2019-02-16: 14:00:00 60 mg via SUBCUTANEOUS

## 2019-03-05 ENCOUNTER — Other Ambulatory Visit (INDEPENDENT_AMBULATORY_CARE_PROVIDER_SITE_OTHER): Payer: PPO

## 2019-03-05 ENCOUNTER — Other Ambulatory Visit: Payer: Self-pay

## 2019-03-05 DIAGNOSIS — E1165 Type 2 diabetes mellitus with hyperglycemia: Secondary | ICD-10-CM

## 2019-03-05 LAB — HEMOGLOBIN A1C: Hgb A1c MFr Bld: 6.8 % — ABNORMAL HIGH (ref 4.6–6.5)

## 2019-03-05 LAB — COMPREHENSIVE METABOLIC PANEL
ALT: 19 U/L (ref 0–35)
AST: 26 U/L (ref 0–37)
Albumin: 4 g/dL (ref 3.5–5.2)
Alkaline Phosphatase: 59 U/L (ref 39–117)
BUN: 20 mg/dL (ref 6–23)
CO2: 30 mEq/L (ref 19–32)
Calcium: 8.9 mg/dL (ref 8.4–10.5)
Chloride: 102 mEq/L (ref 96–112)
Creatinine, Ser: 1.15 mg/dL (ref 0.40–1.20)
GFR: 45.69 mL/min — ABNORMAL LOW (ref >60.00)
Glucose, Bld: 122 mg/dL — ABNORMAL HIGH (ref 70–99)
Potassium: 4.4 mEq/L (ref 3.5–5.1)
Sodium: 137 mEq/L (ref 135–145)
Total Bilirubin: 0.4 mg/dL (ref 0.2–1.2)
Total Protein: 6.8 g/dL (ref 6.0–8.3)

## 2019-03-05 LAB — LIPID PANEL
Cholesterol: 110 mg/dL (ref 0–200)
HDL: 64.9 mg/dL (ref >39.00)
LDL Cholesterol: 33 mg/dL (ref 0–99)
NonHDL: 45.52
Total CHOL/HDL Ratio: 2
Triglycerides: 62 mg/dL (ref 0.0–149.0)
VLDL: 12.4 mg/dL (ref 0.0–40.0)

## 2019-03-06 ENCOUNTER — Other Ambulatory Visit: Payer: Self-pay | Admitting: Endocrinology

## 2019-03-09 ENCOUNTER — Ambulatory Visit: Payer: PPO | Admitting: Endocrinology

## 2019-03-09 ENCOUNTER — Other Ambulatory Visit: Payer: Self-pay

## 2019-03-09 ENCOUNTER — Encounter: Payer: Self-pay | Admitting: Endocrinology

## 2019-03-09 VITALS — BP 128/72 | HR 105 | Ht 62.0 in | Wt 147.8 lb

## 2019-03-09 DIAGNOSIS — R531 Weakness: Secondary | ICD-10-CM | POA: Diagnosis not present

## 2019-03-09 DIAGNOSIS — E1142 Type 2 diabetes mellitus with diabetic polyneuropathy: Secondary | ICD-10-CM

## 2019-03-09 DIAGNOSIS — E1165 Type 2 diabetes mellitus with hyperglycemia: Secondary | ICD-10-CM | POA: Diagnosis not present

## 2019-03-09 DIAGNOSIS — J3089 Other allergic rhinitis: Secondary | ICD-10-CM | POA: Diagnosis not present

## 2019-03-09 DIAGNOSIS — J301 Allergic rhinitis due to pollen: Secondary | ICD-10-CM | POA: Diagnosis not present

## 2019-03-09 NOTE — Patient Instructions (Signed)
If eating dessert or higher fat meals such as fried food will need to take 2 tablets  Check blood sugars on waking up 2-3  days a week  Also check blood sugars about 2 hours after meals and do this after different meals by rotation  Recommended blood sugar levels on waking up are 90-130 and about 2 hours after meal is 130-160  Please bring your blood sugar monitor to each visit, thank you
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
## 2019-03-17 ENCOUNTER — Encounter: Payer: Self-pay | Admitting: Gynecology

## 2019-04-06 ENCOUNTER — Other Ambulatory Visit: Payer: Self-pay | Admitting: Endocrinology

## 2019-04-12 DIAGNOSIS — H04123 Dry eye syndrome of bilateral lacrimal glands: Secondary | ICD-10-CM | POA: Diagnosis not present

## 2019-04-12 DIAGNOSIS — E119 Type 2 diabetes mellitus without complications: Secondary | ICD-10-CM | POA: Diagnosis not present

## 2019-04-12 DIAGNOSIS — Z961 Presence of intraocular lens: Secondary | ICD-10-CM | POA: Diagnosis not present

## 2019-04-12 DIAGNOSIS — H35342 Macular cyst, hole, or pseudohole, left eye: Secondary | ICD-10-CM | POA: Diagnosis not present

## 2019-04-12 DIAGNOSIS — H35033 Hypertensive retinopathy, bilateral: Secondary | ICD-10-CM | POA: Diagnosis not present

## 2019-04-12 LAB — HM DIABETES EYE EXAM

## 2019-04-13 ENCOUNTER — Encounter: Payer: Self-pay | Admitting: Internal Medicine

## 2019-04-13 NOTE — Progress Notes (Signed)
Abstracted and sent to scan  

## 2019-04-22 ENCOUNTER — Other Ambulatory Visit: Payer: Self-pay | Admitting: Women's Health

## 2019-04-22 DIAGNOSIS — Z1231 Encounter for screening mammogram for malignant neoplasm of breast: Secondary | ICD-10-CM

## 2019-04-26 ENCOUNTER — Other Ambulatory Visit: Payer: Self-pay

## 2019-04-26 MED ORDER — METFORMIN HCL 500 MG PO TABS: ORAL_TABLET | ORAL | 0 refills | Status: DC

## 2019-04-26 MED ORDER — BD PEN NEEDLE NANO U/F 32G X 4 MM MISC: 3 refills | Status: DC

## 2019-05-04 ENCOUNTER — Ambulatory Visit: Payer: PPO | Admitting: Neurology

## 2019-05-04 ENCOUNTER — Other Ambulatory Visit: Payer: Self-pay

## 2019-05-04 VITALS — Temp 98.4°F

## 2019-05-04 DIAGNOSIS — G43709 Chronic migraine without aura, not intractable, without status migrainosus: Secondary | ICD-10-CM

## 2019-05-04 NOTE — Progress Notes (Signed)
° °Consent Form °Botulism Toxin Injection For Chronic Migraine ° ° °Last 3 months, no migraines, a few headaches. Significant improvement from baseline of daily headaches and 15 migraine days a month.  ° °+ orb oculi, +LS,  + temples, + left masseter only ° °Reviewed orally with patient, additionally signature is on file: ° °Botulism toxin has been approved by the Federal drug administration for treatment of chronic migraine. Botulism toxin does not cure chronic migraine and it may not be effective in some patients. ° °The administration of botulism toxin is accomplished by injecting a small amount of toxin into the muscles of the neck and head. Dosage must be titrated for each individual. Any benefits resulting from botulism toxin tend to wear off after 3 months with a repeat injection required if benefit is to be maintained. Injections are usually done every 3-4 months with maximum effect peak achieved by about 2 or 3 weeks. Botulism toxin is expensive and you should be sure of what costs you will incur resulting from the injection. ° °The side effects of botulism toxin use for chronic migraine may include: ° ° -Transient, and usually mild, facial weakness with facial injections ° -Transient, and usually mild, head or neck weakness with head/neck injections ° -Reduction or loss of forehead facial animation due to forehead muscle weakness ° -Eyelid drooping ° -Dry eye ° -Pain at the site of injection or bruising at the site of injection ° -Double vision ° -Potential unknown long term risks ° °Contraindications: You should not have Botox if you are pregnant, nursing, allergic to albumin, have an infection, skin condition, or muscle weakness at the site of the injection, or have myasthenia gravis, Lambert-Eaton syndrome, or ALS. ° °It is also possible that as with any injection, there may be an allergic reaction or no effect from the medication. Reduced effectiveness after repeated injections is sometimes seen and  rarely infection at the injection site may occur. All care will be taken to prevent these side effects. If therapy is given over a long time, atrophy and wasting in the muscle injected may occur. Occasionally the patient's become refractory to treatment because they develop antibodies to the toxin. In this event, therapy needs to be modified. ° °I have read the above information and consent to the administration of botulism toxin. ° ° ° °BOTOX PROCEDURE NOTE FOR MIGRAINE HEADACHE ° ° ° °Contraindications and precautions discussed with patient(above). Aseptic procedure was observed and patient tolerated procedure. Procedure performed by Dr. Toni Makyra Corprew ° °The condition has existed for more than 6 months, and pt does not have a diagnosis of ALS, Myasthenia Gravis or Lambert-Eaton Syndrome.  Risks and benefits of injections discussed and pt agrees to proceed with the procedure.  Written consent obtained ° °These injections are medically necessary. Pt  receives good benefits from these injections. These injections do not cause sedations or hallucinations which the oral therapies may cause. ° °Description of procedure: ° °The patient was placed in a sitting position. The standard protocol was used for Botox as follows, with 5 units of Botox injected at each site: ° ° °-Procerus muscle, midline injection ° °-Corrugator muscle, bilateral injection ° °-Frontalis muscle, bilateral injection, with 2 sites each side, medial injection was performed in the upper one third of the frontalis muscle, in the region vertical from the medial inferior edge of the superior orbital rim. The lateral injection was again in the upper one third of the forehead vertically above the lateral limbus of the cornea,   1.5 cm lateral to the medial injection site. ° °- Levator Scapulae: 5 units bilaterally ° °-Temporalis muscle injection, 5 sites, bilaterally. The first injection was 3 cm above the tragus of the ear, second injection site was 1.5 cm to  3 cm up from the first injection site in line with the tragus of the ear. The third injection site was 1.5-3 cm forward between the first 2 injection sites. The fourth injection site was 1.5 cm posterior to the second injection site. 5th site laterally in the temporalis  muscleat the level of the outer canthus. ° °- Patient feels her clenching is a trigger for headaches. +5 units masseter bilaterally  ° °- Patient feels the migraines are centered around the eyes +5 units bilaterally at the outer canthus in the orbicularis occuli ° °-Occipitalis muscle injection, 3 sites, bilaterally. The first injection was done one half way between the occipital protuberance and the tip of the mastoid process behind the ear. The second injection site was done lateral and superior to the first, 1 fingerbreadth from the first injection. The third injection site was 1 fingerbreadth superiorly and medially from the first injection site. ° °-Cervical paraspinal muscle injection, 2 sites, bilateral knee first injection site was 1 cm from the midline of the cervical spine, 3 cm inferior to the lower border of the occipital protuberance. The second injection site was 1.5 cm superiorly and laterally to the first injection site. ° °-Trapezius muscle injection was performed at 3 sites, bilaterally. The first injection site was in the upper trapezius muscle halfway between the inflection point of the neck, and the acromion. The second injection site was one half way between the acromion and the first injection site. The third injection was done between the first injection site and the inflection point of the neck. ° ° °Will return for repeat injection in 3 months. ° ° °A 200 unit sof Botox was used, any Botox not injected was wasted. The patient tolerated the procedure well, there were no complications of the above procedure. ° ° °

## 2019-05-04 NOTE — Progress Notes (Signed)
Botox- 200 units x 1 vial  Lot: [id number] Expiration: 12/2021 NDC: [id number]  Bacteriostatic 0.9% Sodium Chloride- 39mL total Lot: [id number] Expiration: 05/27/2019 NDC: [id number]  Dx: [id number] B/B

## 2019-05-10 ENCOUNTER — Other Ambulatory Visit: Payer: Self-pay

## 2019-05-11 ENCOUNTER — Ambulatory Visit (INDEPENDENT_AMBULATORY_CARE_PROVIDER_SITE_OTHER): Payer: PPO | Admitting: Women's Health

## 2019-05-11 ENCOUNTER — Encounter: Payer: Self-pay | Admitting: Women's Health

## 2019-05-11 DIAGNOSIS — Z79890 Hormone replacement therapy (postmenopausal): Secondary | ICD-10-CM | POA: Diagnosis not present

## 2019-05-11 DIAGNOSIS — Z01419 Encounter for gynecological examination (general) (routine) without abnormal findings: Secondary | ICD-10-CM

## 2019-05-11 MED ORDER — ESTRADIOL 0.025 MG/24HR TD PTWK: 0.0250 mg | MEDICATED_PATCH | TRANSDERMAL | 4 refills | Status: DC

## 2019-05-11 NOTE — Progress Notes (Signed)
[name] 01/08/1941 [id number]    History:    Presents for breast and pelvic annual exam. 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 Discontinued Testerone cream 2 months ago and doing well.. Normal Pap and mammogram history.  2017, Colonoscopy normal., history of polyps prior colonoscopy.  2019 Upper endo showed Hital Hernia, GI manages. Vaccines current. HTN, DM, back pain, migraines managed by PCP.  2019,  T-score -2.9 had been -2.4, started  Prolia injections started 07/2018. sexually active.    Past medical history, past surgical history, family history and social history were all reviewed and documented in the EPIC chart. 3 sons and 8 granddaughters. Hx of GB and spleenectomy.  ROS:  A ROS was performed and pertinent positives and negatives are included.  Exam:  Vitals:   05/11/19 1426  BP: 124/80  Weight: 149 lb (67.6 kg)  Height: 5\' 2"  (1.575 m)   Body mass index is 27.25 kg/m.   General appearance:  Normal Thyroid:  Symmetrical, normal in size, without palpable masses or nodularity. Respiratory  Auscultation:  Clear without wheezing or rhonchi Cardiovascular  Auscultation:  Regular rate, without rubs, murmurs or gallops  Edema/varicosities:  Not grossly evident Abdominal  Soft,nontender, without masses, guarding or rebound.  Liver/spleen:  No organomegaly noted  Hernia:  None appreciated  Skin  Inspection:  Grossly normal   Breasts: Examined lying and sitting.     Right: Without masses, retractions, discharge or axillary adenopathy.     Left: Without masses, retractions, discharge or axillary adenopathy. Gentitourinary   Inguinal/mons:  Normal without inguinal adenopathy  External genitalia:  Normal  BUS/Urethra/Skene's glands:  Normal  Vagina:  Normal  Cervix: Absent  Uterus:  TAH w/ BSO  Adnexa/parametria:     Rt: Without masses or tenderness.   Lt: Without masses or tenderness.  Anus and perineum: Normal  Digital rectal exam: Normal sphincter tone  without palpated masses or tenderness  Assessment/Plan:  78 y.o. MWF. G3 P3 for annual breast and pelvic exam.   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 Ordered  Estradiol patch 0.025mg  weekly. SBE's, continue annual screening mammograms. Repeat Dexa in 2021. Continue Oscal w/D daily. Vaccines are up to date.   Home safety, fall prevention and importance of balance type exercise such as yoga encouraged. prolia 60mg  subq every 6 mo.   Huel Cote [facility], 2:42 PM 05/11/2019

## 2019-05-11 NOTE — Patient Instructions (Signed)
Health Maintenance After Age 78 After age 78, you are at a higher risk for certain long-term diseases and infections as well as injuries from falls. Falls are a major cause of broken bones and head injuries in people who are older than age 78. Getting regular preventive care can help to keep you healthy and well. Preventive care includes getting regular testing and making lifestyle changes as recommended by your health care provider. Talk with your health care provider about:  Which screenings and tests you should have. A screening is a test that checks for a disease when you have no symptoms.  A diet and exercise plan that is right for you. What should I know about screenings and tests to prevent falls? Screening and testing are the best ways to find a health problem early. Early diagnosis and treatment give you the best chance of managing medical conditions that are common after age 78. Certain conditions and lifestyle choices may make you more likely to have a fall. Your health care provider may recommend:  Regular vision checks. Poor vision and conditions such as cataracts can make you more likely to have a fall. If you wear glasses, make sure to get your prescription updated if your vision changes.  Medicine review. Work with your health care provider to regularly review all of the medicines you are taking, including over-the-counter medicines. Ask your health care provider about any side effects that may make you more likely to have a fall. Tell your health care provider if any medicines that you take make you feel dizzy or sleepy.  Osteoporosis screening. Osteoporosis is a condition that causes the bones to get weaker. This can make the bones weak and cause them to break more easily.  Blood pressure screening. Blood pressure changes and medicines to control blood pressure can make you feel dizzy.  Strength and balance checks. Your health care provider may recommend certain tests to check your  strength and balance while standing, walking, or changing positions.  Foot health exam. Foot pain and numbness, as well as not wearing proper footwear, can make you more likely to have a fall.  Depression screening. You may be more likely to have a fall if you have a fear of falling, feel emotionally low, or feel unable to do activities that you used to do.  Alcohol use screening. Using too much alcohol can affect your balance and may make you more likely to have a fall. What actions can I take to lower my risk of falls? General instructions  Talk with your health care provider about your risks for falling. Tell your health care provider if: ? You fall. Be sure to tell your health care provider about all falls, even ones that seem minor. ? You feel dizzy, sleepy, or off-balance.  Take over-the-counter and prescription medicines only as told by your health care provider. These include any supplements.  Eat a healthy diet and maintain a healthy weight. A healthy diet includes low-fat dairy products, low-fat (lean) meats, and fiber from whole grains, beans, and lots of fruits and vegetables. Home safety  Remove any tripping hazards, such as rugs, cords, and clutter.  Install safety equipment such as grab bars in bathrooms and safety rails on stairs.  Keep rooms and walkways well-lit. Activity   Follow a regular exercise program to stay fit. This will help you maintain your balance. Ask your health care provider what types of exercise are appropriate for you.  If you need a cane or   walker, use it as recommended by your health care provider.  Wear supportive shoes that have nonskid soles. Lifestyle  Do not drink alcohol if your health care provider tells you not to drink.  If you drink alcohol, limit how much you have: ? 0-1 drink a day for women. ? 0-2 drinks a day for men.  Be aware of how much alcohol is in your drink. In the [place] one drink equals one typical bottle of beer (12  oz), one-half glass of wine (5 oz), or one shot of hard liquor (1 oz).  Do not use any products that contain nicotine or tobacco, such as cigarettes and e-cigarettes. If you need help quitting, ask your health care provider. Summary  Having a healthy lifestyle and getting preventive care can help to protect your health and wellness after age 78.  Screening and testing are the best way to find a health problem early and help you avoid having a fall. Early diagnosis and treatment give you the best chance for managing medical conditions that are more common for people who are older than age 78.  Falls are a major cause of broken bones and head injuries in people who are older than age 78. Take precautions to prevent a fall at home.  Work with your health care provider to learn what changes you can make to improve your health and wellness and to prevent falls. This information is not intended to replace advice given to you by your health care provider. Make sure you discuss any questions you have with your health care provider. Document Released: 06/25/2017 Document Revised: 12/03/2018 Document Reviewed: 06/25/2017 Elsevier Patient Education  2020 [employer] Inc.  

## 2019-05-13 ENCOUNTER — Other Ambulatory Visit: Payer: Self-pay

## 2019-05-13 ENCOUNTER — Ambulatory Visit (INDEPENDENT_AMBULATORY_CARE_PROVIDER_SITE_OTHER): Payer: PPO

## 2019-05-13 DIAGNOSIS — Z23 Encounter for immunization: Secondary | ICD-10-CM | POA: Diagnosis not present

## 2019-05-16 ENCOUNTER — Other Ambulatory Visit: Payer: Self-pay | Admitting: Endocrinology

## 2019-05-16 ENCOUNTER — Other Ambulatory Visit: Payer: Self-pay | Admitting: Gastroenterology

## 2019-05-16 ENCOUNTER — Other Ambulatory Visit: Payer: Self-pay | Admitting: Physician Assistant

## 2019-05-31 ENCOUNTER — Other Ambulatory Visit: Payer: Self-pay | Admitting: Internal Medicine

## 2019-05-31 NOTE — Telephone Encounter (Signed)
[place] Controlled Database Checked Last filled: 04/27/19 # 90 LOV w/you: 11/23/18 Next appt w/you: None

## 2019-06-03 ENCOUNTER — Encounter: Payer: Self-pay | Admitting: Gynecology

## 2019-06-03 NOTE — Telephone Encounter (Signed)
traMADol-acetaminophen (ULTRACET) 37.5-325 MG tablet  pls refill this pt out and made appt for an in office on 10/14 at 3:40

## 2019-06-03 NOTE — Telephone Encounter (Signed)
Requested medication (s) are due for refill today: yes  Requested medication (s) are on the active medication list: yes  Last refill:  04/27/2019  Future visit scheduled: yes  Notes to clinic:  Refill cannot be delegated    Requested Prescriptions  Pending Prescriptions Disp Refills   traMADol-acetaminophen (ULTRACET) 37.5-325 MG tablet [Pharmacy Med Name: TRAMADOL-ACETAMINOPHN 37.5-325] 90 tablet 3    Sig: TAKE 1 TABLET BY MOUTH EVERY 8 HOURS AS NEEDED FOR PAIN     Not Delegated - Analgesics:  Opioid Agonist Combinations Failed - 06/03/2019 10:43 AM      Failed - This refill cannot be delegated      Failed - Urine Drug Screen completed in last 360 days.      Failed - Valid encounter within last 6 months    Recent Outpatient Visits          6 months ago Elevated LFTs   [place] Plotnikov, Evie Lacks, MD   10 months ago 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 heart failure   [facility] Plot[facility], Evie Lacks, MD   1 year ago DM type 2, controlled, with complication ([place])   [place], Evie Lacks, MD   1 year ago 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 heart failure   [place], Evie Lacks, MD   1 year ago Chronic idiopathic constipation   [place], MD      Future Appointments            In 6 days Plotnikov, Evie Lacks, MD [place], [place]

## 2019-06-07 ENCOUNTER — Ambulatory Visit
Admission: RE | Admit: 2019-06-07 | Discharge: 2019-06-07 | Disposition: A | Discharge status: Home / Self Care | Payer: PPO | Source: Ambulatory Visit | Attending: Women's Health | Admitting: Women's Health

## 2019-06-07 ENCOUNTER — Other Ambulatory Visit: Payer: Self-pay

## 2019-06-07 DIAGNOSIS — Z1231 Encounter for screening mammogram for malignant neoplasm of breast: Secondary | ICD-10-CM

## 2019-06-09 ENCOUNTER — Other Ambulatory Visit: Payer: Self-pay

## 2019-06-09 ENCOUNTER — Other Ambulatory Visit: Payer: Self-pay | Admitting: Women's Health

## 2019-06-09 ENCOUNTER — Encounter: Payer: Self-pay | Admitting: Internal Medicine

## 2019-06-09 ENCOUNTER — Ambulatory Visit (INDEPENDENT_AMBULATORY_CARE_PROVIDER_SITE_OTHER): Payer: PPO | Admitting: Internal Medicine

## 2019-06-09 DIAGNOSIS — E6609 Other obesity due to excess calories: Secondary | ICD-10-CM | POA: Diagnosis not present

## 2019-06-09 DIAGNOSIS — Z9989 Dependence on other enabling machines and devices: Secondary | ICD-10-CM

## 2019-06-09 DIAGNOSIS — E1142 Type 2 diabetes mellitus with diabetic polyneuropathy: Secondary | ICD-10-CM

## 2019-06-09 DIAGNOSIS — F411 Generalized anxiety disorder: Secondary | ICD-10-CM

## 2019-06-09 DIAGNOSIS — M5440 Lumbago with sciatica, unspecified side: Secondary | ICD-10-CM

## 2019-06-09 DIAGNOSIS — G4733 Obstructive sleep apnea (adult) (pediatric): Secondary | ICD-10-CM

## 2019-06-09 DIAGNOSIS — Z6830 Body mass index (BMI) 30.0-30.9, adult: Secondary | ICD-10-CM | POA: Diagnosis not present

## 2019-06-09 DIAGNOSIS — G8929 Other chronic pain: Secondary | ICD-10-CM | POA: Diagnosis not present

## 2019-06-09 DIAGNOSIS — R928 Other abnormal and inconclusive findings on diagnostic imaging of breast: Secondary | ICD-10-CM

## 2019-06-09 MED ORDER — TRAMADOL-ACETAMINOPHEN 37.5-325 MG PO TABS: 1.0000 | ORAL_TABLET | Freq: Three times a day (TID) | ORAL | 3 refills | Status: DC | PRN

## 2019-06-09 NOTE — Assessment & Plan Note (Signed)
Tolerating CPAP OK most of the time

## 2019-06-09 NOTE — Progress Notes (Signed)
Subjective:  Patient ID: [name], female    DOB: [birth year]  Age: 78 y.o. MRN: [id number]  CC: No chief complaint on file.   HPI [name] presents for DM, anxiety, depression, HTN f/u F/u LBP  Outpatient Medications Prior to Visit  Medication Sig Dispense Refill  . aspirin EC 81 MG tablet Take 1 tablet (81 mg total) by mouth daily. 100 tablet 3  . b complex vitamins tablet Take 1 tablet by mouth daily. Vitamin supplement    . BD PEN NEEDLE NANO U/F 32G X 4 MM MISC USE AS DIRECTED TWICE DAILY. 200 each 3  . Blood Glucose Monitoring Suppl ([place]) w/Device KIT Use as instructed to check blood sugar 5 times daily. 1 kit 0  . Brexpiprazole (REXULTI) 1 MG TABS Take 1 mg by mouth. Every third day (Monday & Thursday)    . busPIRone (BUSPAR) 15 MG tablet Take 1 tablet by mouth 2 (two) times daily.    . calcium-vitamin D (OSCAL WITH D) 500-200 MG-UNIT per tablet Take 1 tablet by mouth daily with breakfast.    . cetirizine (ZYRTEC) 10 MG tablet Take 10 mg by mouth daily as needed for allergies.     . cholecalciferol (VITAMIN D) 1000 units tablet Take 2,000 Units by mouth daily.    Marland Kitchen dicyclomine (BENTYL) 10 MG capsule TAKE 1 CAPSULE (10 MG TOTAL) BY MOUTH 3 (THREE) TIMES DAILY BEFORE MEALS. 270 capsule 1  . estradiol (CLIMARA - DOSED IN MG/24 HR) 0.025 mg/24hr patch Place 1 patch (0.025 mg total) onto the skin once a week. APPLY 1 PATCH ONTO SKIN ONCE WEEKLY 12 patch 4  . famotidine (PEPCID) 20 MG tablet 1 TO 2 TABLETS AT BEDTIME-REPLACES RANITADINE 180 tablet 3  . furosemide (LASIX) 20 MG tablet TAKE 1-2 TABLETS (20-40 MG TOTAL) BY MOUTH DAILY. 180 tablet 1  . L-Methylfolate (DEPLIN) 15 MG TABS Take 15 mg by mouth daily. For treatment of depression    . Liniments (SALONPAS PAIN RELIEF PATCH EX) Apply 1 patch topically at bedtime.    Marland Kitchen LORazepam (ATIVAN) 1 MG tablet Take 1 mg by mouth 4 (four) times daily as needed for anxiety.   2  . lovastatin (MEVACOR) 20 MG  tablet TAKE 1 TABLET BY MOUTH EVERYDAY AT BEDTIME 90 tablet 3  . Magnesium 500 MG CAPS Take 1 capsule by mouth daily.    . metFORMIN (GLUCOPHAGE) 500 MG tablet Take 1 tablet by mouth in the morning and 2 tablets in the evening. 270 tablet 0  . Multiple Vitamins-Minerals (MULTIVITAMIN,TX-MINERALS) tablet Take 1 tablet by mouth daily.     . ondansetron (ZOFRAN) 4 MG tablet TAKE 1 TAB BY MOUTH EVERY MORNING AND EVENING as needed. NEEDS OFFICE VISIT FOR FURTHER REFILLS 60 tablet 0  . ONETOUCH DELICA LANCETS 49F MISC USE UP TO 4 TIMES A DAY 200 each 3  . ONETOUCH VERIO test strip USE TO CHECK BLOOD SUGARS 5 TIMES DAILY. 450 each 3  . pantoprazole (PROTONIX) 40 MG tablet TAKE 1 TABLET BY MOUTH TWICE A DAY 180 tablet 3  . pioglitazone (ACTOS) 45 MG tablet TAKE 1 TABLET (45 MG TOTAL) BY MOUTH DAILY. 90 tablet 3  . repaglinide (PRANDIN) 2 MG tablet TAKE 1 TABLET (2 MG TOTAL) BY MOUTH 2 (TWO) TIMES DAILY BEFORE A MEAL. 180 tablet 1  . SUMAtriptan (IMITREX) 100 MG tablet 100 mg as needed.    . Testosterone POWD APPLY A TINY PEA-SIZED AMOUNT ONCE A DAY.  30 g 1  . traMADol-acetaminophen (ULTRACET) 37.5-325 MG tablet TAKE 1 TABLET BY MOUTH EVERY 8 HOURS AS NEEDED FOR PAIN 90 tablet 3  . TRINTELLIX 20 MG TABS Take 20 mg by mouth daily.  2  . VICTOZA 18 MG/3ML SOPN INJECT 1.2MG INTO MUSCLE DAILY 12 pen 1  . VOLTAREN 1 % GEL APPLY 2 GRAMS TO KNEE 2-3 TIMES DAILY PRN  1   Facility-Administered Medications Prior to Visit  Medication Dose Route Frequency Provider Last Rate Last Dose  . 0.9 %  sodium chloride infusion  500 mL Intravenous Once Stark, Malcolm T, MD        ROS: Review of Systems  Constitutional: Positive for fatigue. Negative for activity change, appetite change, chills and unexpected weight change.  HENT: Negative for congestion, mouth sores and sinus pressure.   Eyes: Negative for visual disturbance.  Respiratory: Negative for cough and chest tightness.   Gastrointestinal: Negative for  abdominal pain and nausea.  Genitourinary: Negative for difficulty urinating, frequency and vaginal pain.  Musculoskeletal: Positive for arthralgias, back pain and gait problem.  Skin: Negative for pallor and rash.  Neurological: Negative for dizziness, tremors, weakness, numbness and headaches.  Psychiatric/Behavioral: Positive for decreased concentration, dysphoric mood and sleep disturbance. Negative for confusion, self-injury and suicidal ideas. The patient is nervous/anxious.     Objective:  BP 120/82 (BP Location: Left Arm, Patient Position: Sitting, Cuff Size: Normal)   Pulse (!) 112   Temp 98 F (36.7 C) (Oral)   Ht 5' 2" (1.575 m)   Wt 149 lb (67.6 kg)   SpO2 94%   BMI 27.25 kg/m   BP Readings from Last 3 Encounters:  06/09/19 120/82  05/11/19 124/80  03/09/19 128/72    Wt Readings from Last 3 Encounters:  06/09/19 149 lb (67.6 kg)  05/11/19 149 lb (67.6 kg)  03/09/19 147 lb 12.8 oz (67 kg)    Physical Exam Constitutional:      General: She is not in acute distress.    Appearance: She is well-developed.  HENT:     Head: Normocephalic.     Right Ear: External ear normal.     Left Ear: External ear normal.     Nose: Nose normal.  Eyes:     General:        Right eye: No discharge.        Left eye: No discharge.     Conjunctiva/sclera: Conjunctivae normal.     Pupils: Pupils are equal, round, and reactive to light.  Neck:     Musculoskeletal: Normal range of motion and neck supple.     Thyroid: No thyromegaly.     Vascular: No JVD.     Trachea: No tracheal deviation.  Cardiovascular:     Rate and Rhythm: Normal rate and regular rhythm.     Heart sounds: Normal heart sounds.  Pulmonary:     Effort: No respiratory distress.     Breath sounds: No stridor. No wheezing.  Abdominal:     General: Bowel sounds are normal. There is no distension.     Palpations: Abdomen is soft. There is no mass.     Tenderness: There is no abdominal tenderness. There is no  guarding or rebound.  Musculoskeletal:        General: Tenderness present.  Lymphadenopathy:     Cervical: No cervical adenopathy.  Skin:    Findings: No erythema or rash.  Neurological:     Mental Status: She is oriented to person,   place, and time.     Cranial Nerves: No cranial nerve deficit.     Motor: No abnormal muscle tone.     Coordination: Coordination normal.     Deep Tendon Reflexes: Reflexes normal.  Psychiatric:        Behavior: Behavior normal.        Thought Content: Thought content normal.        Judgment: Judgment normal.     Lab Results  Component Value Date   WBC 7.1 03/13/2018   HGB 10.8 (L) 03/13/2018   HCT 33.2 (L) 03/13/2018   PLT 412 03/13/2018   GLUCOSE 122 (H) 03/05/2019   CHOL 110 03/05/2019   TRIG 62.0 03/05/2019   HDL 64.90 03/05/2019   LDLCALC 33 03/05/2019   ALT 19 03/05/2019   AST 26 03/05/2019   NA 137 03/05/2019   K 4.4 03/05/2019   CL 102 03/05/2019   CREATININE 1.15 03/05/2019   BUN 20 03/05/2019   CO2 30 03/05/2019   TSH 1.29 12/02/2017   INR 0.93 11/02/2013   HGBA1C 6.8 (H) 03/05/2019   MICROALBUR 0.8 11/05/2018    Mm 3d Screen Breast Bilateral  Result Date: 06/08/2019 CLINICAL DATA:  Screening. EXAM: DIGITAL SCREENING BILATERAL MAMMOGRAM WITH TOMO AND CAD COMPARISON:  Previous exam(s). ACR Breast Density Category c: The breast tissue is heterogeneously dense, which may obscure small masses. FINDINGS: In the right breast, possible distortion warrants further evaluation. In the left breast, no findings suspicious for malignancy. Images were processed with CAD. IMPRESSION: Further evaluation is suggested for possible distortion in the right breast. RECOMMENDATION: Diagnostic mammogram and possibly ultrasound of the right breast. (Code:[id number]) The patient will be contacted regarding the findings, and additional imaging will be scheduled. BI-RADS CATEGORY  0: Incomplete. Need additional imaging evaluation and/or prior mammograms for  comparison. Electronically Signed   By: Fidela Salisbury M.D.   On: 06/08/2019 17:20    Assessment & Plan:   There are no diagnoses linked to this encounter.   No orders of the defined types were placed in this encounter.    Follow-up: No follow-ups on file.  Walker Kehr, MD

## 2019-06-09 NOTE — Assessment & Plan Note (Signed)
Ultracet prn  Potential benefits of a long term Tramadol/opioids use as well as potential risks (i.e. addiction risk, apnea etc) and complications (i.e. Somnolence, constipation and others) were explained to the patient and were aknowledged.

## 2019-06-09 NOTE — Assessment & Plan Note (Signed)
On prn Xanax

## 2019-06-09 NOTE — Assessment & Plan Note (Signed)
Wt Readings from Last 3 Encounters:  06/09/19 149 lb (67.6 kg)  05/11/19 149 lb (67.6 kg)  03/09/19 147 lb 12.8 oz (67 kg)

## 2019-06-11 ENCOUNTER — Other Ambulatory Visit (INDEPENDENT_AMBULATORY_CARE_PROVIDER_SITE_OTHER): Payer: PPO

## 2019-06-11 ENCOUNTER — Other Ambulatory Visit: Payer: Self-pay

## 2019-06-11 DIAGNOSIS — E1165 Type 2 diabetes mellitus with hyperglycemia: Secondary | ICD-10-CM

## 2019-06-11 LAB — BASIC METABOLIC PANEL
BUN: 16 mg/dL (ref 6–23)
CO2: 31 mEq/L (ref 19–32)
Calcium: 9.5 mg/dL (ref 8.4–10.5)
Chloride: 99 mEq/L (ref 96–112)
Creatinine, Ser: 1.13 mg/dL (ref 0.40–1.20)
GFR: 46.59 mL/min — ABNORMAL LOW (ref >60.00)
Glucose, Bld: 96 mg/dL (ref 70–99)
Potassium: 4.5 mEq/L (ref 3.5–5.1)
Sodium: 135 mEq/L (ref 135–145)

## 2019-06-11 LAB — HEMOGLOBIN A1C: Hgb A1c MFr Bld: 7 % — ABNORMAL HIGH (ref 4.6–6.5)

## 2019-06-11 NOTE — Telephone Encounter (Signed)
PROLIA GIVEN 02/16/2019 NEXT INJECTION 08/19/2019 

## 2019-06-14 ENCOUNTER — Ambulatory Visit
Admission: RE | Admit: 2019-06-14 | Discharge: 2019-06-14 | Disposition: A | Discharge status: Home / Self Care | Payer: PPO | Source: Ambulatory Visit | Attending: Women's Health | Admitting: Women's Health

## 2019-06-14 ENCOUNTER — Other Ambulatory Visit: Payer: Self-pay

## 2019-06-14 ENCOUNTER — Ambulatory Visit: Payer: PPO

## 2019-06-14 DIAGNOSIS — R922 Inconclusive mammogram: Secondary | ICD-10-CM | POA: Diagnosis not present

## 2019-06-14 DIAGNOSIS — R928 Other abnormal and inconclusive findings on diagnostic imaging of breast: Secondary | ICD-10-CM

## 2019-06-14 DIAGNOSIS — N6489 Other specified disorders of breast: Secondary | ICD-10-CM | POA: Diagnosis not present

## 2019-06-15 ENCOUNTER — Ambulatory Visit (INDEPENDENT_AMBULATORY_CARE_PROVIDER_SITE_OTHER): Payer: PPO | Admitting: Endocrinology

## 2019-06-15 ENCOUNTER — Encounter: Payer: Self-pay | Admitting: Endocrinology

## 2019-06-15 VITALS — BP 134/80 | HR 88 | Ht 62.0 in | Wt 142.8 lb

## 2019-06-15 DIAGNOSIS — E1165 Type 2 diabetes mellitus with hyperglycemia: Secondary | ICD-10-CM | POA: Diagnosis not present
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
## 2019-06-15 NOTE — Patient Instructions (Signed)
Check blood sugars on waking up 2 days a week  Also check blood sugars about 2 hours after meals and do this after different meals by rotation  Recommended blood sugar levels on waking up are 90-130 and about 2 hours after meal is 130-180  Please bring your blood sugar monitor to each visit, thank you  Egg/toast in am or oatmeal/nuts , need protein

## 2019-06-24 ENCOUNTER — Other Ambulatory Visit: Payer: Self-pay | Admitting: Physician Assistant

## 2019-06-26 ENCOUNTER — Other Ambulatory Visit: Payer: Self-pay | Admitting: Physician Assistant

## 2019-06-28 DIAGNOSIS — G4733 Obstructive sleep apnea (adult) (pediatric): Secondary | ICD-10-CM | POA: Diagnosis not present

## 2019-07-09 ENCOUNTER — Other Ambulatory Visit: Payer: Self-pay | Admitting: Endocrinology

## 2019-07-26 ENCOUNTER — Telehealth: Payer: Self-pay | Admitting: *Deleted

## 2019-07-26 NOTE — Telephone Encounter (Signed)
Deductible n/a  OOP MAX $3400 ($[id number])  Annual exam 05/11/2019 [place]  Calcium  9.5           Date 06/11/2019   Upcoming dental procedures   Prior Authorization needed no per [facility] [employer] [phone]  Pt estimated Cost $222  [place] 2021 PT WILL BE OUT OF TOWN IN December CAN [place]     Coverage Details: 20% of one dose, 20% admin fee

## 2019-08-03 ENCOUNTER — Ambulatory Visit: Payer: PPO | Admitting: Neurology

## 2019-08-03 ENCOUNTER — Other Ambulatory Visit: Payer: Self-pay | Admitting: Endocrinology

## 2019-08-03 ENCOUNTER — Other Ambulatory Visit: Payer: Self-pay | Admitting: Internal Medicine

## 2019-08-03 ENCOUNTER — Other Ambulatory Visit: Payer: Self-pay | Admitting: Physician Assistant

## 2019-08-04 DIAGNOSIS — Z20828 Contact with and (suspected) exposure to other viral communicable diseases: Secondary | ICD-10-CM | POA: Diagnosis not present

## 2019-08-06 ENCOUNTER — Other Ambulatory Visit: Payer: Self-pay | Admitting: Endocrinology

## 2019-09-08 ENCOUNTER — Other Ambulatory Visit: Payer: PPO

## 2019-09-09 ENCOUNTER — Telehealth: Payer: Self-pay | Admitting: Internal Medicine

## 2019-09-09 ENCOUNTER — Other Ambulatory Visit (INDEPENDENT_AMBULATORY_CARE_PROVIDER_SITE_OTHER): Payer: PPO

## 2019-09-09 DIAGNOSIS — E1165 Type 2 diabetes mellitus with hyperglycemia: Secondary | ICD-10-CM | POA: Diagnosis not present

## 2019-09-09 LAB — MICROALBUMIN / CREATININE URINE RATIO
Creatinine,U: 109.7 mg/dL
Microalb Creat Ratio: 1.3 mg/g (ref 0.0–30.0)
Microalb, Ur: 1.4 mg/dL (ref 0.0–1.9)

## 2019-09-09 LAB — BASIC METABOLIC PANEL
BUN: 19 mg/dL (ref 6–23)
CO2: 29 mEq/L (ref 19–32)
Calcium: 9.5 mg/dL (ref 8.4–10.5)
Chloride: 102 mEq/L (ref 96–112)
Creatinine, Ser: 1.09 mg/dL (ref 0.40–1.20)
GFR: 48.54 mL/min — ABNORMAL LOW (ref >60.00)
Glucose, Bld: 122 mg/dL — ABNORMAL HIGH (ref 70–99)
Potassium: 4.5 mEq/L (ref 3.5–5.1)
Sodium: 136 mEq/L (ref 135–145)

## 2019-09-09 LAB — HEMOGLOBIN A1C: Hgb A1c MFr Bld: 6.6 % — ABNORMAL HIGH (ref 4.6–6.5)

## 2019-09-09 MED ORDER — TRAMADOL HCL 50 MG PO TABS: 50.0000 mg | ORAL_TABLET | Freq: Two times a day (BID) | ORAL | 1 refills | Status: DC | PRN

## 2019-09-09 NOTE — Telephone Encounter (Signed)
Per husband ultrasound clinic tier 4 now We will replace

## 2019-09-10 ENCOUNTER — Other Ambulatory Visit: Payer: Self-pay

## 2019-09-14 ENCOUNTER — Ambulatory Visit (INDEPENDENT_AMBULATORY_CARE_PROVIDER_SITE_OTHER): Payer: PPO | Admitting: Endocrinology

## 2019-09-14 ENCOUNTER — Encounter: Payer: Self-pay | Admitting: Endocrinology

## 2019-09-14 ENCOUNTER — Other Ambulatory Visit: Payer: Self-pay

## 2019-09-14 VITALS — BP 130/70 | HR 94 | Ht 62.0 in | Wt 149.8 lb

## 2019-09-14 DIAGNOSIS — N1831 Chronic kidney disease, stage 3a: Secondary | ICD-10-CM | POA: Diagnosis not present

## 2019-09-14 DIAGNOSIS — E1165 Type 2 diabetes mellitus with hyperglycemia: Secondary | ICD-10-CM | POA: Diagnosis not present
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
## 2019-09-21 ENCOUNTER — Ambulatory Visit: Payer: PPO | Admitting: Neurology

## 2019-09-21 ENCOUNTER — Telehealth: Payer: Self-pay | Admitting: Nurse Practitioner

## 2019-09-21 NOTE — Telephone Encounter (Signed)
Noted, thank you

## 2019-09-21 NOTE — Telephone Encounter (Signed)
Pt has called stating she will not make appointment today due to being sick.

## 2019-09-27 ENCOUNTER — Telehealth: Payer: Self-pay | Admitting: *Deleted

## 2019-09-27 NOTE — Telephone Encounter (Signed)
-----   Message from Sinclair Grooms sent at 09/27/2019  8:33 AM EST ----- Regarding: [name] Patient call to inquire on her next [name] injection.

## 2019-09-27 NOTE — Telephone Encounter (Signed)
Prolia insurance verification has been sent awaiting Summary of benefits  

## 2019-09-28 ENCOUNTER — Ambulatory Visit: Payer: PPO | Admitting: Neurology

## 2019-09-28 ENCOUNTER — Other Ambulatory Visit: Payer: Self-pay

## 2019-09-28 VITALS — Temp 96.5°F

## 2019-09-28 DIAGNOSIS — G43709 Chronic migraine without aura, not intractable, without status migrainosus: Secondary | ICD-10-CM

## 2019-09-28 NOTE — Progress Notes (Signed)
Consent Form Botulism Toxin Injection For Chronic Migraine   Patient coul dnot make her December botox due to covid (was getting tested) so we are very delayed and she has started having headaches again but botox works extremely well and for 3 months after botox injections she has  no migraines, a few headaches. Significant improvement from baseline of daily headaches and 15 migraine days a month. No masseters, once we did the left one but she didn;t seem to want to inject it again otherwise +a.  Reviewed orally with patient, additionally signature is on file:  Botulism toxin has been approved by the Federal drug administration for treatment of chronic migraine. Botulism toxin does not cure chronic migraine and it may not be effective in some patients.  The administration of botulism toxin is accomplished by injecting a small amount of toxin into the muscles of the neck and head. Dosage must be titrated for each individual. Any benefits resulting from botulism toxin tend to wear off after 3 months with a repeat injection required if benefit is to be maintained. Injections are usually done every 3-4 months with maximum effect peak achieved by about 2 or 3 weeks. Botulism toxin is expensive and you should be sure of what costs you will incur resulting from the injection.  The side effects of botulism toxin use for chronic migraine may include:   -Transient, and usually mild, facial weakness with facial injections  -Transient, and usually mild, head or neck weakness with head/neck injections  -Reduction or loss of forehead facial animation due to forehead muscle weakness  -Eyelid drooping  -Dry eye  -Pain at the site of injection or bruising at the site of injection  -Double vision  -Potential unknown long term risks  Contraindications: You should not have Botox if you are pregnant, nursing, allergic to albumin, have an infection, skin condition, or muscle weakness at the site of the  injection, or have myasthenia gravis, Lambert-Eaton syndrome, or ALS.  It is also possible that as with any injection, there may be an allergic reaction or no effect from the medication. Reduced effectiveness after repeated injections is sometimes seen and rarely infection at the injection site may occur. All care will be taken to prevent these side effects. If therapy is given over a long time, atrophy and wasting in the muscle injected may occur. Occasionally the patient's become refractory to treatment because they develop antibodies to the toxin. In this event, therapy needs to be modified.  I have read the above information and consent to the administration of botulism toxin.    BOTOX PROCEDURE NOTE FOR MIGRAINE HEADACHE    Contraindications and precautions discussed with patient(above). Aseptic procedure was observed and patient tolerated procedure. Procedure performed by Dr. Georgia Dom  The condition has existed for more than 6 months, and pt does not have a diagnosis of ALS, Myasthenia Gravis or Lambert-Eaton Syndrome.  Risks and benefits of injections discussed and pt agrees to proceed with the procedure.  Written consent obtained  These injections are medically necessary. Pt  receives good benefits from these injections. These injections do not cause sedations or hallucinations which the oral therapies may cause.  Description of procedure:  The patient was placed in a sitting position. The standard protocol was used for Botox as follows, with 5 units of Botox injected at each site:   -Procerus muscle, midline injection  -Corrugator muscle, bilateral injection  -Frontalis muscle, bilateral injection, with 2 sites each side, medial injection was performed  in the upper one third of the frontalis muscle, in the region vertical from the medial inferior edge of the superior orbital rim. The lateral injection was again in the upper one third of the forehead vertically above the lateral  limbus of the cornea, 1.5 cm lateral to the medial injection site.  -Temporalis muscle injection, 5 sites, bilaterally. The first injection was 3 cm above the tragus of the ear, second injection site was 1.5 cm to 3 cm up from the first injection site in line with the tragus of the ear. The third injection site was 1.5-3 cm forward between the first 2 injection sites. The fourth injection site was 1.5 cm posterior to the second injection site.   - Patient feels the migraines are centered around the eyes +5 units bilaterally at the outer canthus in the orbicularis occuli  -Occipitalis muscle injection, 3 sites, bilaterally. The first injection was done one half way between the occipital protuberance and the tip of the mastoid process behind the ear. The second injection site was done lateral and superior to the first, 1 fingerbreadth from the first injection. The third injection site was 1 fingerbreadth superiorly and medially from the first injection site.  -Cervical paraspinal muscle injection, 2 sites, bilateral knee first injection site was 1 cm from the midline of the cervical spine, 3 cm inferior to the lower border of the occipital protuberance. The second injection site was 1.5 cm superiorly and laterally to the first injection site.  -Trapezius muscle injection was performed at 3 sites, bilaterally. The first injection site was in the upper trapezius muscle halfway between the inflection point of the neck, and the acromion. The second injection site was one half way between the acromion and the first injection site. The third injection was done between the first injection site and the inflection point of the neck.   Will return for repeat injection in 3 months.   A 200 unit sof Botox was used, any Botox not injected was wasted. The patient tolerated the procedure well, there were no complications of the above procedure.

## 2019-09-28 NOTE — Progress Notes (Signed)
Botox- 200 units x 1 vial Lot: [id number] Expiration: 05/2022 NDC: [id number]  Bacteriostatic 0.9% Sodium Chloride- 50mL total Lot: [id number] Expiration: 11/25/2019 NDC: [id number]  Dx: [id number] B/B

## 2019-09-30 ENCOUNTER — Other Ambulatory Visit: Payer: Self-pay | Admitting: Endocrinology

## 2019-10-04 ENCOUNTER — Ambulatory Visit: Payer: PPO | Admitting: Neurology

## 2019-10-05 NOTE — Telephone Encounter (Signed)
Deductible N/A/  OOP MAX $3400 ($68met)  Annual exam 05/11/2019 TF  Calcium 9.5            Date 09/09/2019  Upcoming dental procedures   Prior Authorization needed   Pt estimated Cost $426   appt 10/20/2019    Coverage Details: 40% one dose, 0% admin fee

## 2019-10-11 DIAGNOSIS — J301 Allergic rhinitis due to pollen: Secondary | ICD-10-CM | POA: Diagnosis not present

## 2019-10-11 DIAGNOSIS — J3089 Other allergic rhinitis: Secondary | ICD-10-CM | POA: Diagnosis not present

## 2019-10-11 DIAGNOSIS — K219 Gastro-esophageal reflux disease without esophagitis: Secondary | ICD-10-CM | POA: Diagnosis not present

## 2019-10-11 DIAGNOSIS — R21 Rash and other nonspecific skin eruption: Secondary | ICD-10-CM | POA: Diagnosis not present

## 2019-10-13 ENCOUNTER — Other Ambulatory Visit: Payer: Self-pay

## 2019-10-13 ENCOUNTER — Encounter: Payer: Self-pay | Admitting: Internal Medicine

## 2019-10-13 ENCOUNTER — Ambulatory Visit (INDEPENDENT_AMBULATORY_CARE_PROVIDER_SITE_OTHER): Payer: PPO | Admitting: Internal Medicine

## 2019-10-13 DIAGNOSIS — D500 Iron deficiency anemia secondary to blood loss (chronic): Secondary | ICD-10-CM

## 2019-10-13 DIAGNOSIS — G8929 Other chronic pain: Secondary | ICD-10-CM | POA: Diagnosis not present

## 2019-10-13 DIAGNOSIS — E118 Type 2 diabetes mellitus with unspecified complications: Secondary | ICD-10-CM | POA: Diagnosis not present

## 2019-10-13 DIAGNOSIS — F411 Generalized anxiety disorder: Secondary | ICD-10-CM | POA: Diagnosis not present

## 2019-10-13 DIAGNOSIS — M5440 Lumbago with sciatica, unspecified side: Secondary | ICD-10-CM | POA: Diagnosis not present

## 2019-10-13 NOTE — Assessment & Plan Note (Signed)
Tramadol prn ° Potential benefits of a long term opioids use as well as potential risks (i.e. addiction risk, apnea etc) and complications (i.e. Somnolence, constipation and others) were explained to the patient and were aknowledged. ° ° °

## 2019-10-13 NOTE — Assessment & Plan Note (Signed)
On prn Xanax  Potential benefits of a long term benzodiazepines  use as well as potential risks  and complications were explained to the patient and were aknowledged. 

## 2019-10-13 NOTE — Progress Notes (Signed)
Subjective:  Patient ID: [name], female    DOB: 09-20-1940  Age: 79 y.o. MRN: [id number]  CC: No chief complaint on file.   HPI MAIRELY FOXWORTH presents for OA, DM, depression f/u  Outpatient Medications Prior to Visit  Medication Sig Dispense Refill  . aspirin EC 81 MG tablet Take 1 tablet (81 mg total) by mouth daily. 100 tablet 3  . b complex vitamins tablet Take 1 tablet by mouth daily. Vitamin supplement    . BD PEN NEEDLE NANO U/F 32G X 4 MM MISC USE AS DIRECTED TWICE DAILY. 200 each 3  . Blood Glucose Monitoring Suppl ([place]) w/Device KIT Use as instructed to check blood sugar 5 times daily. 1 kit 0  . Brexpiprazole (REXULTI) 1 MG TABS Take 1 mg by mouth. Every third day (Monday & Thursday)    . busPIRone (BUSPAR) 15 MG tablet Take 1 tablet by mouth 2 (two) times daily.    . calcium-vitamin D (OSCAL WITH D) 500-200 MG-UNIT per tablet Take 1 tablet by mouth daily with breakfast.    . cetirizine (ZYRTEC) 10 MG tablet Take 10 mg by mouth daily as needed for allergies.     . cholecalciferol (VITAMIN D) 1000 units tablet Take 2,000 Units by mouth daily.    Marland Kitchen dicyclomine (BENTYL) 10 MG capsule TAKE 1 CAPSULE (10 MG TOTAL) BY MOUTH 3 (THREE) TIMES DAILY BEFORE MEALS. 270 capsule 1  . estradiol (CLIMARA - DOSED IN MG/24 HR) 0.025 mg/24hr patch Place 1 patch (0.025 mg total) onto the skin once a week. APPLY 1 PATCH ONTO SKIN ONCE WEEKLY 12 patch 4  . famotidine (PEPCID) 20 MG tablet 1 TO 2 TABLETS AT BEDTIME-REPLACES RANITADINE 180 tablet 3  . L-Methylfolate (DEPLIN) 15 MG TABS Take 15 mg by mouth daily. For treatment of depression    . Liniments (SALONPAS PAIN RELIEF PATCH EX) Apply 1 patch topically at bedtime.    Marland Kitchen LORazepam (ATIVAN) 1 MG tablet Take 1 mg by mouth 4 (four) times daily as needed for anxiety.   2  . lovastatin (MEVACOR) 20 MG tablet TAKE 1 TABLET BY MOUTH EVERYDAY AT BEDTIME 90 tablet 3  . Magnesium 500 MG CAPS Take 1 capsule by mouth daily.     . metFORMIN (GLUCOPHAGE) 500 MG tablet TAKE 1 TABLET BY MOUTH IN THE MORNING AND 2 TABLETS IN THE EVENING 270 tablet 0  . Multiple Vitamins-Minerals (MULTIVITAMIN,TX-MINERALS) tablet Take 1 tablet by mouth daily.     . ondansetron (ZOFRAN) 4 MG tablet TAKE 1 TABLET BY MOUTH EVERY MORNING AND EVENING AS NEEDED. NEED OFFICE VISIT FOR REFILLS 60 tablet 0  . ONETOUCH DELICA LANCETS 46O MISC USE UP TO 4 TIMES A DAY 200 each 3  . ONETOUCH VERIO test strip USE TO CHECK BLOOD SUGARS 5 TIMES DAILY. 450 each 3  . pantoprazole (PROTONIX) 40 MG tablet TAKE 1 TABLET BY MOUTH TWICE A DAY 180 tablet 3  . pioglitazone (ACTOS) 45 MG tablet TAKE 1 TABLET (45 MG TOTAL) BY MOUTH DAILY. 90 tablet 3  . repaglinide (PRANDIN) 2 MG tablet TAKE 1 TABLET (2 MG TOTAL) BY MOUTH 2 (TWO) TIMES DAILY BEFORE A MEAL. 180 tablet 1  . SUMAtriptan (IMITREX) 100 MG tablet 100 mg as needed.    . traMADol (ULTRAM) 50 MG tablet Take 1 tablet (50 mg total) by mouth every 12 (twelve) hours as needed for severe pain. 180 tablet 1  . TRINTELLIX 20 MG TABS Take 20  mg by mouth daily.  2  . VICTOZA 18 MG/3ML SOPN INJECT 1.2MG INTO MUSCLE DAILY 18 mL 2  . VOLTAREN 1 % GEL APPLY 2 GRAMS TO KNEE 2-3 TIMES DAILY PRN  1  . furosemide (LASIX) 20 MG tablet TAKE 1-2 TABLETS (20-40 MG TOTAL) BY MOUTH DAILY. (Patient not taking: Reported on 10/13/2019) 180 tablet 1  . L-Methylfolate-Algae (DEPLIN 15) 15-90.314 MG CAPS Take by mouth.     Facility-Administered Medications Prior to Visit  Medication Dose Route Frequency Provider Last Rate Last Admin  . 0.9 %  sodium chloride infusion  500 mL Intravenous Once Ladene Artist, MD        ROS: Review of Systems  Constitutional: Positive for fatigue. Negative for activity change, appetite change, chills and unexpected weight change.  HENT: Negative for congestion, mouth sores and sinus pressure.   Eyes: Negative for visual disturbance.  Respiratory: Negative for cough and chest tightness.    Gastrointestinal: Negative for abdominal pain and nausea.  Genitourinary: Negative for difficulty urinating, frequency and vaginal pain.  Musculoskeletal: Positive for arthralgias. Negative for back pain and gait problem.  Skin: Negative for pallor and rash.  Neurological: Negative for dizziness, tremors, weakness, numbness and headaches.  Psychiatric/Behavioral: Positive for dysphoric mood. Negative for confusion, sleep disturbance and suicidal ideas.    Objective:  BP (!) 152/88 (BP Location: Right Arm, Patient Position: Sitting, Cuff Size: Normal)   Pulse (!) 109   Temp 97.8 F (36.6 C)   Ht _0  (1.575 m)   Wt 147 lb 6 oz (66.8 kg)   SpO2 96%   BMI 26.96 kg/m   BP Readings from Last 3 Encounters:  10/13/19 (!) 152/88  09/14/19 130/70  06/15/19 134/80    Wt Readings from Last 3 Encounters:  10/13/19 147 lb 6 oz (66.8 kg)  09/14/19 149 lb 12.8 oz (67.9 kg)  06/15/19 142 lb 12.8 oz (64.8 kg)    Physical Exam Constitutional:      General: She is not in acute distress.    Appearance: She is well-developed.  HENT:     Head: Normocephalic.     Right Ear: External ear normal.     Left Ear: External ear normal.     Nose: Nose normal.  Eyes:     General:        Right eye: No discharge.        Left eye: No discharge.     Conjunctiva/sclera: Conjunctivae normal.     Pupils: Pupils are equal, round, and reactive to light.  Neck:     Thyroid: No thyromegaly.     Vascular: No JVD.     Trachea: No tracheal deviation.  Cardiovascular:     Rate and Rhythm: Normal rate and regular rhythm.     Heart sounds: Normal heart sounds.  Pulmonary:     Effort: No respiratory distress.     Breath sounds: No stridor. No wheezing.  Abdominal:     General: Bowel sounds are normal. There is no distension.     Palpations: Abdomen is soft. There is no mass.     Tenderness: There is no abdominal tenderness. There is no guarding or rebound.  Musculoskeletal:        General: No  tenderness.     Cervical back: Normal range of motion and neck supple.  Lymphadenopathy:     Cervical: No cervical adenopathy.  Skin:    Findings: No erythema or rash.  Neurological:     Mental  Status: She is oriented to person, place, and time.     Cranial Nerves: No cranial nerve deficit.     Motor: No abnormal muscle tone.     Coordination: Coordination normal.     Deep Tendon Reflexes: Reflexes normal.  Psychiatric:        Behavior: Behavior normal.        Thought Content: Thought content normal.        Judgment: Judgment normal.     Lab Results  Component Value Date   WBC 7.1 03/13/2018   HGB 10.8 (L) 03/13/2018   HCT 33.2 (L) 03/13/2018   PLT 412 03/13/2018   GLUCOSE 122 (H) 09/09/2019   CHOL 110 03/05/2019   TRIG 62.0 03/05/2019   HDL 64.90 03/05/2019   LDLCALC 33 03/05/2019   ALT 19 03/05/2019   AST 26 03/05/2019   NA 136 09/09/2019   K 4.5 09/09/2019   CL 102 09/09/2019   CREATININE 1.09 09/09/2019   BUN 19 09/09/2019   CO2 29 09/09/2019   TSH 1.29 12/02/2017   INR 0.93 11/02/2013   HGBA1C 6.6 (H) 09/09/2019   MICROALBUR 1.4 09/09/2019    MM DIAG BREAST TOMO UNI RIGHT  Result Date: 06/14/2019 CLINICAL DATA:  Screening recall for possible architectural distortion in the right breast. EXAM: DIGITAL DIAGNOSTIC UNILATERAL RIGHT MAMMOGRAM WITH CAD AND TOMO COMPARISON:  Previous exam(s). ACR Breast Density Category c: The breast tissue is heterogeneously dense, which may obscure small masses. FINDINGS: On the diagnostic spot-compression images, the possible distortion disperses consistent with normal superimposed fibroglandular tissue. There is no underlying true distortion. There are no masses. No suspicious calcifications. Mammographic images were processed with CAD. IMPRESSION: Negative exam.  No evidence of breast malignancy. RECOMMENDATION: Screening mammogram in one year.(Code:SM-B-01Y) I have discussed the findings and recommendations with the patient. If  applicable, a reminder letter will be sent to the patient regarding the next appointment. BI-RADS CATEGORY  1: Negative. Electronically Signed   By: Lajean Manes M.D.   On: 06/14/2019 14:41    Assessment & Plan:    Walker Kehr, MD

## 2019-10-13 NOTE — Assessment & Plan Note (Signed)
Monitor labs 

## 2019-10-13 NOTE — Assessment & Plan Note (Signed)
Victoza, Metformin, Lantus

## 2019-10-14 ENCOUNTER — Ambulatory Visit: Payer: Self-pay | Admitting: Neurology

## 2019-10-20 ENCOUNTER — Ambulatory Visit: Payer: PPO

## 2019-10-26 ENCOUNTER — Ambulatory Visit: Payer: Self-pay | Admitting: Neurology

## 2019-10-27 ENCOUNTER — Telehealth: Payer: Self-pay | Admitting: *Deleted

## 2019-10-27 ENCOUNTER — Ambulatory Visit (INDEPENDENT_AMBULATORY_CARE_PROVIDER_SITE_OTHER): Payer: PPO | Admitting: *Deleted

## 2019-10-27 ENCOUNTER — Other Ambulatory Visit: Payer: Self-pay

## 2019-10-27 DIAGNOSIS — M810 Age-related osteoporosis without current pathological fracture: Secondary | ICD-10-CM | POA: Diagnosis not present

## 2019-10-27 MED ORDER — DENOSUMAB 60 MG/ML ~~LOC~~ SOSY
60.0000 mg | PREFILLED_SYRINGE | Freq: Once | SUBCUTANEOUS | Status: AC
  Administered 2019-10-27: 60 mg via SUBCUTANEOUS

## 2019-10-27 MED ORDER — DENOSUMAB 60 MG/ML ~~LOC~~ SOSY: 60.0000 mg | PREFILLED_SYRINGE | Freq: Once | SUBCUTANEOUS | 0 refills | Status: DC

## 2019-10-27 NOTE — Telephone Encounter (Signed)
Bone density is due after June 04, 2020.  Calcium can be constipating, also try to eat dark green leafy vegetables most days.  A vitamin D supplement will help with calcium absorption 2000 IUs of vitamin D D3 daily is also a good idea.  Exercise is the best thing for bones weightbearing and balance type exercise on a regular basis.

## 2019-10-27 NOTE — Telephone Encounter (Signed)
Pt left message asking when should she has another Bone Denstiy?  Pt also states she takes calcium 600 mg 3 daily is that to much?  Will send message to Elon Alas NP

## 2019-10-29 DIAGNOSIS — G4733 Obstructive sleep apnea (adult) (pediatric): Secondary | ICD-10-CM | POA: Diagnosis not present

## 2019-10-31 ENCOUNTER — Other Ambulatory Visit: Payer: Self-pay | Admitting: Physician Assistant

## 2019-10-31 ENCOUNTER — Other Ambulatory Visit: Payer: Self-pay | Admitting: Endocrinology

## 2019-11-01 NOTE — Telephone Encounter (Signed)
Ran new SOB for 2021  Deductible  N/A  OOP MAX $3400 ($0MET)  Annual exam 05/11/2019  Calcium  9.5           Date 09/09/2019  Upcoming dental procedures NO  Prior Authorization needed NO  Pt estimated Cost $426       Coverage Details: 40% ONE DOSE,0% ADMINFEE         PROLIA GIVEN 10/27/2019 NEXT INJECTION 04/29/2020

## 2019-11-01 NOTE — Telephone Encounter (Signed)
Pt aware of recommendations

## 2019-11-04 ENCOUNTER — Other Ambulatory Visit: Payer: Self-pay | Admitting: Endocrinology

## 2019-11-11 ENCOUNTER — Ambulatory Visit: Payer: Self-pay | Admitting: Neurology

## 2019-11-17 ENCOUNTER — Encounter: Payer: Self-pay | Admitting: Neurology

## 2019-11-22 ENCOUNTER — Ambulatory Visit: Payer: PPO | Admitting: Neurology

## 2019-11-22 ENCOUNTER — Other Ambulatory Visit: Payer: Self-pay

## 2019-11-22 ENCOUNTER — Encounter: Payer: Self-pay | Admitting: Neurology

## 2019-11-22 VITALS — BP 146/81 | HR 106 | Ht 62.0 in | Wt 148.0 lb

## 2019-11-22 DIAGNOSIS — G4733 Obstructive sleep apnea (adult) (pediatric): Secondary | ICD-10-CM | POA: Diagnosis not present

## 2019-11-22 DIAGNOSIS — Z9989 Dependence on other enabling machines and devices: Secondary | ICD-10-CM | POA: Diagnosis not present

## 2019-11-22 NOTE — Progress Notes (Signed)
Order for cpap supplies sent to [facility] via community message. Confirmation received that the order transmitted was successful.  

## 2019-11-22 NOTE — Patient Instructions (Addendum)
Please continue using your CPAP regularly. While your insurance may require that you use CPAP at least 4 hours each night on 70% of the nights, I recommend, that you not skip any nights and use it throughout the night if you can. Getting used to CPAP and staying with the treatment long term does take time and patience and discipline. Untreated obstructive sleep apnea when it is moderate to severe can have an adverse impact on cardiovascular health and raise her risk for heart disease, arrhythmias, hypertension, congestive heart failure, stroke and diabetes. Untreated obstructive sleep apnea causes sleep disruption, nonrestorative sleep, and sleep deprivation. This can have an impact on your day to day functioning and cause daytime sleepiness and impairment of cognitive function, memory loss, mood disturbance, and problems focussing. Using CPAP regularly can improve these symptoms. We can see you in 1 year, you can see one of our nurse practitioners as you are stable.  If you would like to increase the ramp time or lower the starting pressure, please talk to  [facility]. You have continued to do well on CPAP, please be sure to change your filter every 1 month, your mask about every 1-2 months, hose about every 6 months, humidifier chamber about yearly. Some restrictions are imposed by her insurance carrier with regard to how frequently you can get certain supplies.
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
## 2019-11-30 ENCOUNTER — Telehealth: Payer: Self-pay | Admitting: Physician Assistant

## 2019-11-30 ENCOUNTER — Other Ambulatory Visit: Payer: Self-pay

## 2019-11-30 MED ORDER — ONDANSETRON HCL 4 MG PO TABS: ORAL_TABLET | ORAL | 0 refills | Status: DC

## 2019-11-30 NOTE — Telephone Encounter (Signed)
Refilled the Zofran #30 and no additional refills. Office visit is scheduled for 12/14/19 with Amy Esterwood, PA-C.

## 2019-11-30 NOTE — Telephone Encounter (Signed)
Pt is requesting a rf for zofran sent to [facility] in [place]. Pt just made an appt with Amy on 4/20 at 3:00pm for meds rf.

## 2019-12-14 ENCOUNTER — Telehealth: Payer: Self-pay | Admitting: *Deleted

## 2019-12-14 ENCOUNTER — Ambulatory Visit: Payer: PPO | Admitting: Physician Assistant

## 2019-12-14 NOTE — Telephone Encounter (Signed)
I called [facility] [phone] and spoke to [place].  She states that [id number] and [phone] are valid and billable.  They do not require PA.  Ref# for this call is [id number].

## 2019-12-15 ENCOUNTER — Encounter: Payer: Self-pay | Admitting: Physician Assistant

## 2019-12-15 ENCOUNTER — Ambulatory Visit: Payer: PPO | Admitting: Physician Assistant

## 2019-12-15 VITALS — BP 130/70 | HR 112 | Temp 97.8°F | Ht 62.0 in | Wt 147.4 lb

## 2019-12-15 DIAGNOSIS — R1013 Epigastric pain: Secondary | ICD-10-CM

## 2019-12-15 DIAGNOSIS — R110 Nausea: Secondary | ICD-10-CM

## 2019-12-15 DIAGNOSIS — K589 Irritable bowel syndrome without diarrhea: Secondary | ICD-10-CM

## 2019-12-15 DIAGNOSIS — K219 Gastro-esophageal reflux disease without esophagitis: Secondary | ICD-10-CM

## 2019-12-15 MED ORDER — ONDANSETRON HCL 4 MG PO TABS: 4.0000 mg | ORAL_TABLET | Freq: Two times a day (BID) | ORAL | 4 refills | Status: DC

## 2019-12-15 MED ORDER — DICYCLOMINE HCL 10 MG PO CAPS: 10.0000 mg | ORAL_CAPSULE | Freq: Two times a day (BID) | ORAL | 4 refills | Status: DC | PRN

## 2019-12-15 MED ORDER — PANTOPRAZOLE SODIUM 40 MG PO TBEC: 40.0000 mg | DELAYED_RELEASE_TABLET | Freq: Two times a day (BID) | ORAL | 4 refills | Status: DC

## 2019-12-15 MED ORDER — FAMOTIDINE 20 MG PO TABS: 20.0000 mg | ORAL_TABLET | Freq: Every day | ORAL | 4 refills | Status: DC

## 2019-12-15 NOTE — Progress Notes (Signed)
Subjective:    Patient ID: [name], female    DOB: [birth year], 79 y.o.   MRN: [id number]  HPI [name] is a very nice 80 year old white female known to Dr. Fuller Plan and myself.  She comes in today for routine follow-up and medication refills. She was last seen in the office in July 2019. She had EGD in June 2019 with finding of a medium sized hiatal hernia and otherwise negative exam. She does have history of adenomatous colon polyps and last had colonoscopy in April 2017 which was normal.  She was indicated for 5-year interval follow-up as colonoscopy in 2013 revealed a 10 mm cecal polyp which was a tubular adenoma. Patient has history of IBS, chronic GERD, adult onset diabetes mellitus, chronic nausea/dyspepsia, she is status post splenectomy and cholecystectomy, has history of hypertension migraines and congestive heart failure.  She has been maintained on Protonix 40 mg p.o. twice daily and takes Pepcid 20 mg at bedtime.  She says this regimen has been working well for her with no breakthrough symptoms.  She denies any dysphagia or odynophagia. She uses dicyclomine 10 mg once daily generally, and has been using MiraLAX and Metamucil wafers most days which works well for her constipation.  She very occasionally gets some abdominal cramping. Continues to have problems with chronic queasiness/vague nausea without vomiting.  This has been a longstanding problem.  She uses Zofran generally 4 mg every morning and rarely requires another dose.  She says this helps a lot and wants to make sure she has refills, also asking if it is okay to take this twice daily if needed.  Appetite has been good recently, weight stable.  Review of Systems Pertinent positive and negative review of systems were noted in the above HPI section.  All other review of systems was otherwise negative.  Outpatient Encounter Medications as of 12/15/2019  Medication Sig  . aspirin EC 81 MG tablet Take 1 tablet (81 mg total)  by mouth daily.  Marland Kitchen b complex vitamins tablet Take 1 tablet by mouth daily. Vitamin supplement  . BD PEN NEEDLE NANO U/F 32G X 4 MM MISC USE AS DIRECTED TWICE DAILY.  Marland Kitchen Blood Glucose Monitoring Suppl ([place]) w/Device KIT Use as instructed to check blood sugar 5 times daily.  . Brexpiprazole (REXULTI) 1 MG TABS Take 1 mg by mouth. Every third day (Monday & Thursday)  . busPIRone (BUSPAR) 15 MG tablet Take 1 tablet by mouth 2 (two) times daily.  . calcium-vitamin D (OSCAL WITH D) 500-200 MG-UNIT per tablet Take 1 tablet by mouth daily with breakfast.  . cetirizine (ZYRTEC) 10 MG tablet Take 10 mg by mouth daily as needed for allergies.   . cholecalciferol (VITAMIN D) 1000 units tablet Take 2,000 Units by mouth daily.  Marland Kitchen dicyclomine (BENTYL) 10 MG capsule Take 1 capsule (10 mg total) by mouth 2 (two) times daily as needed for spasms.  Marland Kitchen estradiol (CLIMARA - DOSED IN MG/24 HR) 0.025 mg/24hr patch Place 1 patch (0.025 mg total) onto the skin once a week. APPLY 1 PATCH ONTO SKIN ONCE WEEKLY  . famotidine (PEPCID) 20 MG tablet Take 1 tablet (20 mg total) by mouth at bedtime.  . furosemide (LASIX) 20 MG tablet TAKE 1-2 TABLETS (20-40 MG TOTAL) BY MOUTH DAILY.  Marland Kitchen L-Methylfolate (DEPLIN) 15 MG TABS Take 15 mg by mouth daily. For treatment of depression  . Liniments (SALONPAS PAIN RELIEF PATCH EX) Apply 1 patch topically at bedtime.  Marland Kitchen LORazepam (  ATIVAN) 1 MG tablet Take 1 mg by mouth 4 (four) times daily as needed for anxiety.   . lovastatin (MEVACOR) 20 MG tablet TAKE 1 TABLET BY MOUTH EVERYDAY AT BEDTIME  . Magnesium 500 MG CAPS Take 1 capsule by mouth daily.  . metFORMIN (GLUCOPHAGE) 500 MG tablet TAKE 1 TABLET BY MOUTH IN THE MORNING AND 2 TABLETS IN THE EVENING  . Multiple Vitamins-Minerals (MULTIVITAMIN,TX-MINERALS) tablet Take 1 tablet by mouth daily.   . ondansetron (ZOFRAN) 4 MG tablet Take 1 tablet (4 mg total) by mouth 2 (two) times daily. TAKE 1 TABLET BY MOUTH EVERY MORNING  AND EVENING AS NEEDED. NEED OFFICE VISIT FOR REFILLS  . ONETOUCH DELICA LANCETS 44B MISC USE UP TO 4 TIMES A DAY  . ONETOUCH VERIO test strip USE TO CHECK BLOOD SUGARS 5 TIMES DAILY.  . pantoprazole (PROTONIX) 40 MG tablet Take 1 tablet (40 mg total) by mouth 2 (two) times daily.  . pioglitazone (ACTOS) 45 MG tablet TAKE 1 TABLET (45 MG TOTAL) BY MOUTH DAILY.  . repaglinide (PRANDIN) 2 MG tablet TAKE 1 TABLET (2 MG TOTAL) BY MOUTH 2 (TWO) TIMES DAILY BEFORE A MEAL.  Marland Kitchen SUMAtriptan (IMITREX) 100 MG tablet 100 mg as needed.  . traMADol (ULTRAM) 50 MG tablet Take 1 tablet (50 mg total) by mouth every 12 (twelve) hours as needed for severe pain.  . TRINTELLIX 20 MG TABS Take 20 mg by mouth daily.  Marland Kitchen VICTOZA 18 MG/3ML SOPN INJECT 1.2MG INTO MUSCLE DAILY  . VOLTAREN 1 % GEL APPLY 2 GRAMS TO KNEE 2-3 TIMES DAILY PRN  . [DISCONTINUED] dicyclomine (BENTYL) 10 MG capsule TAKE 1 CAPSULE (10 MG TOTAL) BY MOUTH 3 (THREE) TIMES DAILY BEFORE MEALS.  . [DISCONTINUED] famotidine (PEPCID) 20 MG tablet 1 TO 2 TABLETS AT BEDTIME-REPLACES RANITADINE  . [DISCONTINUED] ondansetron (ZOFRAN) 4 MG tablet TAKE 1 TABLET BY MOUTH EVERY MORNING AND EVENING AS NEEDED. NEED OFFICE VISIT FOR REFILLS  . [DISCONTINUED] pantoprazole (PROTONIX) 40 MG tablet TAKE 1 TABLET BY MOUTH TWICE A DAY  . [DISCONTINUED] 0.9 %  sodium chloride infusion    No facility-administered encounter medications on file as of 12/15/2019.   Allergies  Allergen Reactions  . Amlodipine Other (See Comments)    Dizzy, headaches  . Celebrex [Celecoxib] Other (See Comments)    headaches  . Topamax [Topiramate] Other (See Comments)    hallucinations  . Amitiza [Lubiprostone]     dizziness  . Codeine Nausea Only  . Gabapentin Other (See Comments)    dizzy  . Hydrocodone Nausea Only and Other (See Comments)    Feeling funny,   . Meloxicam Other (See Comments)    jittery  . Pravastatin Sodium Other (See Comments)    Muscle aches and pains   Patient  Active Problem List   Diagnosis Date Noted  . Vertigo 11/23/2018  . Osteoporosis 07/15/2018  . Diabetic peripheral neuropathy associated with type 2 diabetes mellitus ([place]) 04/10/2017  . Chronic nausea 03/10/2017  . IDA (iron deficiency anemia) 10/16/2016  . Anemia 09/04/2016  . Intractable chronic migraine without aura and with status migrainosus 08/12/2016  . CAP (community acquired pneumonia) 05/28/2016  . Acute confusional state 05/16/2016  . Chronic migraine w/o aura w/o status migrainosus, not intractable 04/13/2016  . OSA on CPAP 01/30/2016  . Acute URI 10/17/2015  . Rash 07/12/2015  . SI (sacroiliac) pain 03/30/2015  . Sciatica of right side 08/08/2014  . Acute blood loss anemia 03/09/2014  . DJD (degenerative joint disease)  of knee 11/10/2013  . Constipation 10/29/2013  . Obesity 08/04/2013  . Osteoarthritis of right knee 08/04/2013  . Edema 05/11/2013  . Fall 09/16/2012  . Neck pain 09/16/2012  . Low back pain 09/16/2012  . Cerumen impaction 08/07/2011  . Acute bronchitis 05/21/2011  . Fatigue 08/07/2010  . PARESTHESIA 08/07/2010  . DYSPHAGIA 07/13/2009  . Irritable bowel syndrome 09/24/2007  . Elevated liver function tests 09/24/2007  . DM type 2, controlled, with complication ([place]) 26/37/8588  . Anxiety state 06/26/2007  . Depression 06/26/2007  . 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 heart failure 06/26/2007  . GERD 06/26/2007  . OSTEOARTHRITIS 06/26/2007  . COLONIC POLYPS, HX OF 06/26/2007  . SPLENECTOMY, TOTAL, HX OF 06/26/2007   Social History   Socioeconomic History  . Marital status: Married    Spouse name: Patrick Jupiter  . Number of children: 3  . Years of education: 12  . Highest education level: Not on file  Occupational History  . Occupation: Retired    Fish farm manager: RETIRED  Tobacco Use  . Smoking status: Never Smoker  . Smokeless tobacco: Never Used  Substance and Sexual Activity  . Alcohol use: No    Alcohol/week: 0.0 standard drinks  .  Drug use: No  . Sexual activity: Yes    Birth control/protection: Surgical, Post-menopausal    Comment: HYST, INTERCOURSE AGE 28, SEXUAL PARTNERS LESS THAN 5  Other Topics Concern  . Not on file  Social History Narrative   Daily Caffeine Use:  Rarely   Regular Exercise -  No - [facility] [employer]   Married with 3 sons      Social Determinants of Health   Financial Resource Strain:   . Difficulty of Paying Living Expenses:   Food Insecurity:   . Worried About Charity fundraiser in the Last Year:   . Arboriculturist in the Last Year:   Transportation Needs:   . Film/video editor (Medical):   Marland Kitchen Lack of Transportation (Non-Medical):   Physical Activity:   . Days of Exercise per Week:   . Minutes of Exercise per Session:   Stress:   . Feeling of Stress :   Social Connections:   . Frequency of Communication with Friends and Family:   . Frequency of Social Gatherings with Friends and Family:   . Attends Religious Services:   . Active Member of Clubs or Organizations:   . Attends Archivist Meetings:   Marland Kitchen Marital Status:   Intimate Partner Violence:   . Fear of Current or Ex-Partner:   . Emotionally Abused:   Marland Kitchen Physically Abused:   . Sexually Abused:     Ms. Bencomo family history includes Breast cancer in an other family member; Breast cancer (age of onset: 27) in her sister; Colon cancer in her son; Colon cancer (age of onset: 52) in her mother; Colon polyps in her mother; Diabetes in her father.      Objective:    Vitals:   12/15/19 1326  BP: 130/70  Pulse: (!) 112  Temp: 97.8 F (36.6 C)    Physical Exam Well-developed well-nourished eld WF in no acute distress.  Height, Weight, BMI 25.9 147 HEENT; nontraumatic normocephalic, EOMI, PE RR LA, sclera anicteric. Oropharynx;not examined Neck; supple, no JVD Cardiovascular; regular rate and rhythm with S1-S2, no murmur rub or gallop Pulmonary; Clear bilaterally Abdomen; soft,  nontender, nondistended, no palpable mass or hepatosplenomegaly, bowel sounds are active Rectal;not done Skin; benign exam,  no jaundice rash or appreciable lesions Extremities; no clubbing cyanosis or edema skin warm and dry Neuro/Psych; alert and oriented x4, grossly nonfocal mood and affect appropriate      Assessment & Plan:   #67 79 year old white female with chronic GERD-well-controlled on current regimen #2 chronic nausea/dyspepsia-patient is status post cholecystectomy, no chronic gastropathy on EGD and previous normal gastric emptying scan. Symptoms well controlled with Zofran #3 history of adenomatous colon polyps-up-to-date with colonoscopy last done April 2017 and normal-due for interval follow-up 2022 #4 chronic constipation-stable on daily MiraLAX and Metamucil  #5 IBS #6 status post splenectomy and cholecystectomy #7 adult onset diabetes mellitus 8.  Congestive heart failure 9.  Hypertension  Plan; Patient will continue her current regimen.  Refill Protonix 40 mg p.o. twice daily x1 year Refill Bentyl/dicyclomine 10 mg p.o. twice daily as needed x1 year Refill Zofran 4 mg p.o. twice daily as needed x1 year Refill Pepcid 20 mg p.o. nightly x1 year Continue Metamucil daily and MiraLAX 17 g in 8 ounces of water. Patient will follow up in 1 year or sooner as needed.  Xavian Hardcastle S Braya Habermehl PA-C 12/15/2019   Cc: Plotnikov, Evie Lacks, MD

## 2019-12-15 NOTE — Patient Instructions (Addendum)
If you are age 79 or older, your body mass index should be between 23-30. Your Body mass index is 26.96 kg/m. If this is out of the aforementioned range listed, please consider follow up with your Primary Care Provider.  If you are age 64 or younger, your body mass index should be between 19-25. Your Body mass index is 26.96 kg/m. If this is out of the aformentioned range listed, please consider follow up with your Primary Care Provider.   We have sent the following medications to your pharmacy for you to pick up at your convenience: Pantoprazole, Famotidine, Bentyl, Zofran.  Continue Miralax 17 gram daily in 6-8 ounces of water.  Follow up in one year or sooner if needed.

## 2019-12-16 NOTE — Progress Notes (Signed)
Reviewed and agree with management plan.  Tryston Gilliam T. Leeloo Silverthorne, MD FACG [facility]  

## 2019-12-27 NOTE — Telephone Encounter (Signed)
I returned [name] call to reschedule but got no answer.  Message left on voicemail that we would go ahead and cancel tomorrow's appointment and to please call back to reschedule.

## 2019-12-27 NOTE — Telephone Encounter (Signed)
Pt husband called to reschedule apt for tomorrow please FU

## 2019-12-28 ENCOUNTER — Ambulatory Visit: Payer: Self-pay | Admitting: Neurology

## 2019-12-28 NOTE — Telephone Encounter (Signed)
Pt husband called back in regards to missed call states he will be at the gym but can be reached after 11am

## 2019-12-28 NOTE — Telephone Encounter (Signed)
I returned call to Mr [name].  Patient had an appointment on 12/28/2019 that they had to cancel.  He states there was some sort of mix up.  The next available that I could offer is June 28.  He would like to know if she could be worked in sooner.  Please advise.

## 2019-12-30 ENCOUNTER — Other Ambulatory Visit: Payer: Self-pay

## 2019-12-30 ENCOUNTER — Ambulatory Visit: Payer: PPO | Admitting: Family Medicine

## 2019-12-30 DIAGNOSIS — G43709 Chronic migraine without aura, not intractable, without status migrainosus: Secondary | ICD-10-CM

## 2019-12-30 NOTE — Progress Notes (Addendum)
Consent Form Botulism Toxin Injection For Chronic Migraine    Reviewed orally with patient, additionally signature is on file:  Botulism toxin has been approved by the Federal drug administration for treatment of chronic migraine. Botulism toxin does not cure chronic migraine and it may not be effective in some patients.  The administration of botulism toxin is accomplished by injecting a small amount of toxin into the muscles of the neck and head. Dosage must be titrated for each individual. Any benefits resulting from botulism toxin tend to wear off after 3 months with a repeat injection required if benefit is to be maintained. Injections are usually done every 3-4 months with maximum effect peak achieved by about 2 or 3 weeks. Botulism toxin is expensive and you should be sure of what costs you will incur resulting from the injection.  The side effects of botulism toxin use for chronic migraine may include:   -Transient, and usually mild, facial weakness with facial injections  -Transient, and usually mild, head or neck weakness with head/neck injections  -Reduction or loss of forehead facial animation due to forehead muscle weakness  -Eyelid drooping  -Dry eye  -Pain at the site of injection or bruising at the site of injection  -Double vision  -Potential unknown long term risks   Contraindications: You should not have Botox if you are pregnant, nursing, allergic to albumin, have an infection, skin condition, or muscle weakness at the site of the injection, or have myasthenia gravis, Lambert-Eaton syndrome, or ALS.  It is also possible that as with any injection, there may be an allergic reaction or no effect from the medication. Reduced effectiveness after repeated injections is sometimes seen and rarely infection at the injection site may occur. All care will be taken to prevent these side effects. If therapy is given over a long time, atrophy and wasting in the muscle injected may  occur. Occasionally the patient's become refractory to treatment because they develop antibodies to the toxin. In this event, therapy needs to be modified.  I have read the above information and consent to the administration of botulism toxin.    BOTOX PROCEDURE NOTE FOR MIGRAINE HEADACHE  Contraindications and precautions discussed with patient(above). Aseptic procedure was observed and patient tolerated procedure. Procedure performed by Debbora Presto, FNP-C.   The condition has existed for more than 6 months, and pt does not have a diagnosis of ALS, Myasthenia Gravis or Lambert-Eaton Syndrome.  Risks and benefits of injections discussed and pt agrees to proceed with the procedure.  Written consent obtained  These injections are medically necessary. Pt  receives good benefits from these injections. These injections do not cause sedations or hallucinations which the oral therapies may cause.   Description of procedure:  The patient was placed in a sitting position. The standard protocol was used for Botox as follows, with 5 units of Botox injected at each site:  -Procerus muscle, midline injection  -Corrugator muscle, bilateral injection  -Frontalis muscle, bilateral injection, with 2 sites each side, medial injection was performed in the upper one third of the frontalis muscle, in the region vertical from the medial inferior edge of the superior orbital rim. The lateral injection was again in the upper one third of the forehead vertically above the lateral limbus of the cornea, 1.5 cm lateral to the medial injection site.  -Temporalis muscle injection, 4 sites, bilaterally. The first injection was 3 cm above the tragus of the ear, second injection site was 1.5 cm to  3 cm up from the first injection site in line with the tragus of the ear. The third injection site was 1.5-3 cm forward between the first 2 injection sites. The fourth injection site was 1.5 cm posterior to the second injection  site. 5th site laterally in the temporalis  muscleat the level of the outer canthus.  -Occipitalis muscle injection, 3 sites, bilaterally. The first injection was done one half way between the occipital protuberance and the tip of the mastoid process behind the ear. The second injection site was done lateral and superior to the first, 1 fingerbreadth from the first injection. The third injection site was 1 fingerbreadth superiorly and medially from the first injection site.  -Cervical paraspinal muscle injection, 2 sites, bilaterally. The first injection site was 1 cm from the midline of the cervical spine, 3 cm inferior to the lower border of the occipital protuberance. The second injection site was 1.5 cm superiorly and laterally to the first injection site.  -Trapezius muscle injection was performed at 3 sites, bilaterally. The first injection site was in the upper trapezius muscle halfway between the inflection point of the neck, and the acromion. The second injection site was one half way between the acromion and the first injection site. The third injection was done between the first injection site and the inflection point of the neck.   Will return for repeat injection in 3 months.   A total of 200 units of Botox was prepared, 155 units of Botox was injected as documented above, any Botox not injected was wasted. The patient tolerated the procedure well, there were no complications of the above procedure.  Made any corrections needed, and agree with procedure  Sarina Ill, MD [facility]

## 2019-12-30 NOTE — Progress Notes (Signed)
Botox- 200 units x 1 vial Lot: [id number] Expiration: 07/2022 NDC: [id number]  Bacteriostatic 0.9% Sodium Chloride- 67mL total Lot: [id number] Expiration: 09/22 NDC: [id number]  Dx: [id number] B/B

## 2020-01-07 ENCOUNTER — Other Ambulatory Visit: Payer: Self-pay

## 2020-01-07 ENCOUNTER — Other Ambulatory Visit: Payer: Self-pay | Admitting: Internal Medicine

## 2020-01-07 ENCOUNTER — Other Ambulatory Visit (INDEPENDENT_AMBULATORY_CARE_PROVIDER_SITE_OTHER): Payer: PPO

## 2020-01-07 DIAGNOSIS — N1831 Chronic kidney disease, stage 3a: Secondary | ICD-10-CM

## 2020-01-07 DIAGNOSIS — E1165 Type 2 diabetes mellitus with hyperglycemia: Secondary | ICD-10-CM

## 2020-01-07 LAB — COMPREHENSIVE METABOLIC PANEL
ALT: 13 U/L (ref 0–35)
AST: 22 U/L (ref 0–37)
Albumin: 4 g/dL (ref 3.5–5.2)
Alkaline Phosphatase: 52 U/L (ref 39–117)
BUN: 17 mg/dL (ref 6–23)
CO2: 31 mEq/L (ref 19–32)
Calcium: 9.3 mg/dL (ref 8.4–10.5)
Chloride: 100 mEq/L (ref 96–112)
Creatinine, Ser: 1.14 mg/dL (ref 0.40–1.20)
GFR: 46.05 mL/min — ABNORMAL LOW (ref >60.00)
Glucose, Bld: 144 mg/dL — ABNORMAL HIGH (ref 70–99)
Potassium: 4.4 mEq/L (ref 3.5–5.1)
Sodium: 135 mEq/L (ref 135–145)
Total Bilirubin: 0.3 mg/dL (ref 0.2–1.2)
Total Protein: 6.7 g/dL (ref 6.0–8.3)

## 2020-01-07 LAB — HEMOGLOBIN A1C: Hgb A1c MFr Bld: 6.6 % — ABNORMAL HIGH (ref 4.6–6.5)

## 2020-01-11 ENCOUNTER — Ambulatory Visit: Payer: PPO | Admitting: Endocrinology

## 2020-01-11 ENCOUNTER — Other Ambulatory Visit: Payer: Self-pay

## 2020-01-11 ENCOUNTER — Encounter: Payer: Self-pay | Admitting: Endocrinology

## 2020-01-11 VITALS — BP 110/70 | HR 102 | Ht 62.0 in | Wt 147.4 lb

## 2020-01-11 DIAGNOSIS — E785 Hyperlipidemia, unspecified: Secondary | ICD-10-CM

## 2020-01-11 DIAGNOSIS — E1165 Type 2 diabetes mellitus with hyperglycemia: Secondary | ICD-10-CM | POA: Diagnosis not present

## 2020-01-11 DIAGNOSIS — N1831 Chronic kidney disease, stage 3a: Secondary | ICD-10-CM

## 2020-01-11 MED ORDER — REPAGLINIDE 1 MG PO TABS: ORAL_TABLET | ORAL | 1 refills | Status: DC

## 2020-01-11 NOTE — Patient Instructions (Addendum)
Check blood sugars on waking up 1-2  days a week  Also check blood sugars about 2 hours after meals and do this after different meals by rotation  Recommended blood sugar levels on waking up are 90-130 and about 2 hours after meal is 130-160  Please bring your blood sugar monitor to each visit, thank you  
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
## 2020-01-18 ENCOUNTER — Encounter: Payer: Self-pay | Admitting: Gynecology

## 2020-01-19 ENCOUNTER — Ambulatory Visit (INDEPENDENT_AMBULATORY_CARE_PROVIDER_SITE_OTHER): Payer: PPO

## 2020-01-19 DIAGNOSIS — Z0000 Encounter for general adult medical examination without abnormal findings: Secondary | ICD-10-CM
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
## 2020-01-28 DIAGNOSIS — G4733 Obstructive sleep apnea (adult) (pediatric): Secondary | ICD-10-CM | POA: Diagnosis not present

## 2020-02-10 ENCOUNTER — Ambulatory Visit: Payer: PPO | Admitting: Internal Medicine

## 2020-02-15 ENCOUNTER — Other Ambulatory Visit: Payer: Self-pay

## 2020-02-15 ENCOUNTER — Encounter: Payer: Self-pay | Admitting: Internal Medicine

## 2020-02-15 ENCOUNTER — Ambulatory Visit (INDEPENDENT_AMBULATORY_CARE_PROVIDER_SITE_OTHER): Payer: PPO | Admitting: Internal Medicine

## 2020-02-15 DIAGNOSIS — R600 Localized edema: Secondary | ICD-10-CM

## 2020-02-15 DIAGNOSIS — E1142 Type 2 diabetes mellitus with diabetic polyneuropathy: Secondary | ICD-10-CM

## 2020-02-15 DIAGNOSIS — R251 Tremor, unspecified: Secondary | ICD-10-CM

## 2020-02-15 DIAGNOSIS — E6609 Other obesity due to excess calories: Secondary | ICD-10-CM | POA: Diagnosis not present

## 2020-02-15 DIAGNOSIS — F3341 Major depressive disorder, recurrent, in partial remission: Secondary | ICD-10-CM | POA: Diagnosis not present

## 2020-02-15 DIAGNOSIS — Z6830 Body mass index (BMI) 30.0-30.9, adult: Secondary | ICD-10-CM | POA: Diagnosis not present

## 2020-02-15 DIAGNOSIS — F411 Generalized anxiety disorder: Secondary | ICD-10-CM

## 2020-02-15 DIAGNOSIS — E118 Type 2 diabetes mellitus with unspecified complications: Secondary | ICD-10-CM

## 2020-02-15 NOTE — Progress Notes (Signed)
Subjective:  Patient ID: [name], female    DOB: 09-23-1940  Age: 79 y.o. MRN: [id number]  CC: No chief complaint on file.   HPI [name] presents for depression, CFS, DM, pains f/u  Outpatient Medications Prior to Visit  Medication Sig Dispense Refill  . aspirin EC 81 MG tablet Take 1 tablet (81 mg total) by mouth daily. 100 tablet 3  . b complex vitamins tablet Take 1 tablet by mouth daily. Vitamin supplement    . BD PEN NEEDLE NANO U/F 32G X 4 MM MISC USE AS DIRECTED TWICE DAILY. 200 each 3  . Blood Glucose Monitoring Suppl ([place]) w/Device KIT Use as instructed to check blood sugar 5 times daily. 1 kit 0  . Brexpiprazole (REXULTI) 1 MG TABS Take 1 mg by mouth. Every third day (Monday & Thursday)    . busPIRone (BUSPAR) 15 MG tablet Take 1 tablet by mouth 2 (two) times daily.    . calcium-vitamin D (OSCAL WITH D) 500-200 MG-UNIT per tablet Take 1 tablet by mouth daily with breakfast.    . cetirizine (ZYRTEC) 10 MG tablet Take 10 mg by mouth daily as needed for allergies.     . cholecalciferol (VITAMIN D) 1000 units tablet Take 2,000 Units by mouth daily.    Marland Kitchen dicyclomine (BENTYL) 10 MG capsule Take 1 capsule (10 mg total) by mouth 2 (two) times daily as needed for spasms. 180 capsule 4  . estradiol (CLIMARA - DOSED IN MG/24 HR) 0.025 mg/24hr patch Place 1 patch (0.025 mg total) onto the skin once a week. APPLY 1 PATCH ONTO SKIN ONCE WEEKLY 12 patch 4  . famotidine (PEPCID) 20 MG tablet Take 1 tablet (20 mg total) by mouth at bedtime. 90 tablet 4  . furosemide (LASIX) 20 MG tablet TAKE 1-2 TABLETS (20-40 MG TOTAL) BY MOUTH DAILY. 180 tablet 1  . L-Methylfolate (DEPLIN) 15 MG TABS Take 15 mg by mouth daily. For treatment of depression    . Liniments (SALONPAS PAIN RELIEF PATCH EX) Apply 1 patch topically at bedtime.    Marland Kitchen LORazepam (ATIVAN) 1 MG tablet Take 1 mg by mouth 4 (four) times daily as needed for anxiety.   2  . lovastatin (MEVACOR) 20 MG  tablet Take 1 tablet (20 mg total) by mouth at bedtime. Annual appt due in July w/labs pt must see provider for future refills 90 tablet 0  . Magnesium 500 MG CAPS Take 1 capsule by mouth daily.    . metFORMIN (GLUCOPHAGE) 500 MG tablet TAKE 1 TABLET BY MOUTH IN THE MORNING AND 2 TABLETS IN THE EVENING 270 tablet 0  . Multiple Vitamins-Minerals (MULTIVITAMIN,TX-MINERALS) tablet Take 1 tablet by mouth daily.     . ondansetron (ZOFRAN) 4 MG tablet Take 1 tablet (4 mg total) by mouth 2 (two) times daily. TAKE 1 TABLET BY MOUTH EVERY MORNING AND EVENING AS NEEDED. NEED OFFICE VISIT FOR REFILLS 180 tablet 4  . ONETOUCH DELICA LANCETS 72Z MISC USE UP TO 4 TIMES A DAY 200 each 3  . ONETOUCH VERIO test strip USE TO CHECK BLOOD SUGARS 5 TIMES DAILY. 450 each 3  . pantoprazole (PROTONIX) 40 MG tablet Take 1 tablet (40 mg total) by mouth 2 (two) times daily. 180 tablet 4  . pioglitazone (ACTOS) 45 MG tablet TAKE 1 TABLET (45 MG TOTAL) BY MOUTH DAILY. 90 tablet 3  . repaglinide (PRANDIN) 1 MG tablet 2 Before Bfst and 1 at dinner 270 tablet 1  .  SUMAtriptan (IMITREX) 100 MG tablet 100 mg as needed.    . traMADol (ULTRAM) 50 MG tablet Take 1 tablet (50 mg total) by mouth every 12 (twelve) hours as needed for severe pain. 180 tablet 1  . TRINTELLIX 20 MG TABS Take 20 mg by mouth daily.  2  . VICTOZA 18 MG/3ML SOPN INJECT 1.2MG INTO MUSCLE DAILY 18 mL 2  . VOLTAREN 1 % GEL APPLY 2 GRAMS TO KNEE 2-3 TIMES DAILY PRN  1   No facility-administered medications prior to visit.    ROS: Review of Systems  Constitutional: Positive for fatigue. Negative for activity change, appetite change, chills and unexpected weight change.  HENT: Negative for congestion, mouth sores and sinus pressure.   Eyes: Negative for visual disturbance.  Respiratory: Negative for cough and chest tightness.   Gastrointestinal: Negative for abdominal pain and nausea.  Genitourinary: Negative for difficulty urinating, frequency and vaginal  pain.  Musculoskeletal: Positive for arthralgias and back pain. Negative for gait problem.  Skin: Negative for pallor and rash.  Neurological: Positive for tremors and weakness. Negative for dizziness, numbness and headaches.  Psychiatric/Behavioral: Positive for decreased concentration, dysphoric mood and sleep disturbance. Negative for confusion, hallucinations, self-injury and suicidal ideas. The patient is nervous/anxious.     Objective:  BP 138/72 (BP Location: Right Arm, Patient Position: Sitting, Cuff Size: Normal)   Pulse 99   Temp 97.9 F (36.6 C) (Oral)   Ht '5\' 2"'  (1.575 m)   Wt 145 lb (65.8 kg)   SpO2 97%   BMI 26.52 kg/m   BP Readings from Last 3 Encounters:  02/15/20 138/72  01/11/20 110/70  12/15/19 130/70    Wt Readings from Last 3 Encounters:  02/15/20 145 lb (65.8 kg)  01/11/20 147 lb 6.4 oz (66.9 kg)  12/15/19 147 lb 6.4 oz (66.9 kg)    Physical Exam Constitutional:      General: She is not in acute distress.    Appearance: She is well-developed.  HENT:     Head: Normocephalic.     Right Ear: External ear normal.     Left Ear: External ear normal.     Nose: Nose normal.  Eyes:     General:        Right eye: No discharge.        Left eye: No discharge.     Conjunctiva/sclera: Conjunctivae normal.     Pupils: Pupils are equal, round, and reactive to light.  Neck:     Thyroid: No thyromegaly.     Vascular: No JVD.     Trachea: No tracheal deviation.  Cardiovascular:     Rate and Rhythm: Normal rate and regular rhythm.     Heart sounds: Normal heart sounds.  Pulmonary:     Effort: No respiratory distress.     Breath sounds: No stridor. No wheezing.  Abdominal:     General: Bowel sounds are normal. There is no distension.     Palpations: Abdomen is soft. There is no mass.     Tenderness: There is no abdominal tenderness. There is no guarding or rebound.  Musculoskeletal:        General: No tenderness.     Cervical back: Normal range of motion  and neck supple.  Lymphadenopathy:     Cervical: No cervical adenopathy.  Skin:    Findings: No erythema or rash.  Neurological:     Mental Status: She is oriented to person, place, and time.     Cranial Nerves:  No cranial nerve deficit.     Motor: Weakness present. No abnormal muscle tone.     Coordination: Coordination abnormal.     Deep Tendon Reflexes: Reflexes normal.  Psychiatric:        Behavior: Behavior normal.        Thought Content: Thought content normal.        Judgment: Judgment normal.   mild tremor in thumbs left 2nd toe blister 5 mm  Lab Results  Component Value Date   WBC 7.1 03/13/2018   HGB 10.8 (L) 03/13/2018   HCT 33.2 (L) 03/13/2018   PLT 412 03/13/2018   GLUCOSE 144 (H) 01/07/2020   CHOL 110 03/05/2019   TRIG 62.0 03/05/2019   HDL 64.90 03/05/2019   LDLCALC 33 03/05/2019   ALT 13 01/07/2020   AST 22 01/07/2020   NA 135 01/07/2020   K 4.4 01/07/2020   CL 100 01/07/2020   CREATININE 1.14 01/07/2020   BUN 17 01/07/2020   CO2 31 01/07/2020   TSH 1.29 12/02/2017   INR 0.93 11/02/2013   HGBA1C 6.6 (H) 01/07/2020   MICROALBUR 1.4 09/09/2019    MM DIAG BREAST TOMO UNI RIGHT  Result Date: 06/14/2019 CLINICAL DATA:  Screening recall for possible architectural distortion in the right breast. EXAM: DIGITAL DIAGNOSTIC UNILATERAL RIGHT MAMMOGRAM WITH CAD AND TOMO COMPARISON:  Previous exam(s). ACR Breast Density Category c: The breast tissue is heterogeneously dense, which may obscure small masses. FINDINGS: On the diagnostic spot-compression images, the possible distortion disperses consistent with normal superimposed fibroglandular tissue. There is no underlying true distortion. There are no masses. No suspicious calcifications. Mammographic images were processed with CAD. IMPRESSION: Negative exam.  No evidence of breast malignancy. RECOMMENDATION: Screening mammogram in one year.(Code:SM-B-01Y) I have discussed the findings and recommendations with the  patient. If applicable, a reminder letter will be sent to the patient regarding the next appointment. BI-RADS CATEGORY  1: Negative. Electronically Signed   By: Lajean Manes M.D.   On: 06/14/2019 14:41    Assessment & Plan:     Follow-up: No follow-ups on file.  Walker Kehr, MD

## 2020-02-15 NOTE — Assessment & Plan Note (Signed)
Mild She will see Dr Jaynee Eagles and ask

## 2020-02-15 NOTE — Assessment & Plan Note (Signed)
Cont w/ Trintellix, Buspar  F/u w/Dr Toy Care

## 2020-02-15 NOTE — Assessment & Plan Note (Signed)
Wt Readings from Last 3 Encounters:  02/15/20 145 lb (65.8 kg)  01/11/20 147 lb 6.4 oz (66.9 kg)  12/15/19 147 lb 6.4 oz (66.9 kg)

## 2020-02-15 NOTE — Assessment & Plan Note (Addendum)
Actos, Prandin, Victoza 

## 2020-02-15 NOTE — Assessment & Plan Note (Signed)
Resolved

## 2020-02-15 NOTE — Assessment & Plan Note (Signed)
F/u w/Dr Kaur ?

## 2020-02-17 ENCOUNTER — Other Ambulatory Visit: Payer: Self-pay | Admitting: Endocrinology

## 2020-03-07 NOTE — Telephone Encounter (Signed)
PROLIA GIVEN  10/27/2019 NEXT INJECTION 04/29/2020

## 2020-04-03 ENCOUNTER — Other Ambulatory Visit: Payer: Self-pay | Admitting: Internal Medicine

## 2020-04-04 ENCOUNTER — Ambulatory Visit: Payer: PPO | Admitting: Neurology

## 2020-04-04 DIAGNOSIS — G43709 Chronic migraine without aura, not intractable, without status migrainosus: Secondary | ICD-10-CM | POA: Diagnosis not present

## 2020-04-04 NOTE — Progress Notes (Signed)
Consent Form Botulism Toxin Injection For Chronic Migraine   04/04/2020: Significant improvement from baseline of daily headaches and 15 migraine days a month. She does not clench, no need to injectmasseters.  Reviewed orally with patient, additionally signature is on file:  Botulism toxin has been approved by the Federal drug administration for treatment of chronic migraine. Botulism toxin does not cure chronic migraine and it may not be effective in some patients.  The administration of botulism toxin is accomplished by injecting a small amount of toxin into the muscles of the neck and head. Dosage must be titrated for each individual. Any benefits resulting from botulism toxin tend to wear off after 3 months with a repeat injection required if benefit is to be maintained. Injections are usually done every 3-4 months with maximum effect peak achieved by about 2 or 3 weeks. Botulism toxin is expensive and you should be sure of what costs you will incur resulting from the injection.  The side effects of botulism toxin use for chronic migraine may include:   -Transient, and usually mild, facial weakness with facial injections  -Transient, and usually mild, head or neck weakness with head/neck injections  -Reduction or loss of forehead facial animation due to forehead muscle weakness  -Eyelid drooping  -Dry eye  -Pain at the site of injection or bruising at the site of injection  -Double vision  -Potential unknown long term risks  Contraindications: You should not have Botox if you are pregnant, nursing, allergic to albumin, have an infection, skin condition, or muscle weakness at the site of the injection, or have myasthenia gravis, Lambert-Eaton syndrome, or ALS.  It is also possible that as with any injection, there may be an allergic reaction or no effect from the medication. Reduced effectiveness after repeated injections is sometimes seen and rarely infection at the injection site may  occur. All care will be taken to prevent these side effects. If therapy is given over a long time, atrophy and wasting in the muscle injected may occur. Occasionally the patient's become refractory to treatment because they develop antibodies to the toxin. In this event, therapy needs to be modified.  I have read the above information and consent to the administration of botulism toxin.    BOTOX PROCEDURE NOTE FOR MIGRAINE HEADACHE    Contraindications and precautions discussed with patient(above). Aseptic procedure was observed and patient tolerated procedure. Procedure performed by Dr. Georgia Dom  The condition has existed for more than 6 months, and pt does not have a diagnosis of ALS, Myasthenia Gravis or Lambert-Eaton Syndrome.  Risks and benefits of injections discussed and pt agrees to proceed with the procedure.  Written consent obtained  These injections are medically necessary. Pt  receives good benefits from these injections. These injections do not cause sedations or hallucinations which the oral therapies may cause.  Description of procedure:  The patient was placed in a sitting position. The standard protocol was used for Botox as follows, with 5 units of Botox injected at each site:   -Procerus muscle, midline injection  -Corrugator muscle, bilateral injection  -Frontalis muscle, bilateral injection, with 2 sites each side, medial injection was performed in the upper one third of the frontalis muscle, in the region vertical from the medial inferior edge of the superior orbital rim. The lateral injection was again in the upper one third of the forehead vertically above the lateral limbus of the cornea, 1.5 cm lateral to the medial injection site.  -Temporalis muscle injection,  5 sites, bilaterally. The first injection was 3 cm above the tragus of the ear, second injection site was 1.5 cm to 3 cm up from the first injection site in line with the tragus of the ear. The third  injection site was 1.5-3 cm forward between the first 2 injection sites. The fourth injection site was 1.5 cm posterior to the second injection site.   - Patient feels the migraines are centered around the eyes +5 units bilaterally at the outer canthus in the orbicularis occuli  -Occipitalis muscle injection, 3 sites, bilaterally. The first injection was done one half way between the occipital protuberance and the tip of the mastoid process behind the ear. The second injection site was done lateral and superior to the first, 1 fingerbreadth from the first injection. The third injection site was 1 fingerbreadth superiorly and medially from the first injection site.  -Cervical paraspinal muscle injection, 2 sites, bilateral knee first injection site was 1 cm from the midline of the cervical spine, 3 cm inferior to the lower border of the occipital protuberance. The second injection site was 1.5 cm superiorly and laterally to the first injection site.  -Trapezius muscle injection was performed at 3 sites, bilaterally. The first injection site was in the upper trapezius muscle halfway between the inflection point of the neck, and the acromion. The second injection site was one half way between the acromion and the first injection site. The third injection was done between the first injection site and the inflection point of the neck.   Will return for repeat injection in 3 months.   A 200 unit sof Botox was used, any Botox not injected was wasted. The patient tolerated the procedure well, there were no complications of the above procedure.

## 2020-04-04 NOTE — Progress Notes (Signed)
Botox- 200 units x 1 vial Lot: [id number] Expiration: 11/2022 NDC: 0023-3921-02  Bacteriostatic 0.9% Sodium Chloride- 4mL total Lot: [id number] Expiration: 05/26/2021 NDC: 0409-1966-02  Dx: G43.709 B/B  

## 2020-04-10 NOTE — Telephone Encounter (Signed)
[place] Controlled Database Checked Last filled: 12/19/2019 (180) LOV w/you: 02/15/2020 Next appt w/you: 05/23/2020

## 2020-04-11 ENCOUNTER — Encounter: Payer: Self-pay | Admitting: Internal Medicine

## 2020-04-11 DIAGNOSIS — Z961 Presence of intraocular lens: Secondary | ICD-10-CM | POA: Diagnosis not present

## 2020-04-11 DIAGNOSIS — H35033 Hypertensive retinopathy, bilateral: Secondary | ICD-10-CM | POA: Diagnosis not present

## 2020-04-11 DIAGNOSIS — E119 Type 2 diabetes mellitus without complications: Secondary | ICD-10-CM | POA: Diagnosis not present

## 2020-04-11 DIAGNOSIS — H04123 Dry eye syndrome of bilateral lacrimal glands: Secondary | ICD-10-CM | POA: Diagnosis not present

## 2020-04-11 LAB — HM DIABETES EYE EXAM

## 2020-04-11 MED ORDER — TRAMADOL HCL 50 MG PO TABS: 50.0000 mg | ORAL_TABLET | Freq: Two times a day (BID) | ORAL | 1 refills | Status: DC | PRN

## 2020-04-17 ENCOUNTER — Ambulatory Visit: Payer: Self-pay | Admitting: Neurology

## 2020-04-18 ENCOUNTER — Telehealth: Payer: Self-pay | Admitting: *Deleted

## 2020-04-18 NOTE — Telephone Encounter (Addendum)
Deductible N/A  OOP MAX $3400 ($2met)  Annual exam upcoming 05/15/2020  Calcium  9.3           Date 01/07/2020  Upcoming dental procedures NO  Prior Authorization needed NO  Pt estimated Cost $426     [place] 05/15/2020    Coverage Details: 40% ONE DOSE, 0% ADMIN FEE

## 2020-04-30 ENCOUNTER — Other Ambulatory Visit: Payer: Self-pay | Admitting: Internal Medicine

## 2020-05-05 ENCOUNTER — Other Ambulatory Visit: Payer: Self-pay | Admitting: Nurse Practitioner

## 2020-05-05 DIAGNOSIS — Z1231 Encounter for screening mammogram for malignant neoplasm of breast: Secondary | ICD-10-CM

## 2020-05-10 ENCOUNTER — Other Ambulatory Visit (INDEPENDENT_AMBULATORY_CARE_PROVIDER_SITE_OTHER): Payer: PPO

## 2020-05-10 ENCOUNTER — Other Ambulatory Visit: Payer: Self-pay

## 2020-05-10 DIAGNOSIS — E1165 Type 2 diabetes mellitus with hyperglycemia: Secondary | ICD-10-CM | POA: Diagnosis not present

## 2020-05-10 DIAGNOSIS — E785 Hyperlipidemia, unspecified: Secondary | ICD-10-CM | POA: Diagnosis not present

## 2020-05-10 LAB — COMPREHENSIVE METABOLIC PANEL
ALT: 15 U/L (ref 0–35)
AST: 24 U/L (ref 0–37)
Albumin: 4 g/dL (ref 3.5–5.2)
Alkaline Phosphatase: 48 U/L (ref 39–117)
BUN: 19 mg/dL (ref 6–23)
CO2: 30 mEq/L (ref 19–32)
Calcium: 9.6 mg/dL (ref 8.4–10.5)
Chloride: 100 mEq/L (ref 96–112)
Creatinine, Ser: 1.26 mg/dL — ABNORMAL HIGH (ref 0.40–1.20)
GFR: 40.99 mL/min — ABNORMAL LOW (ref >60.00)
Glucose, Bld: 108 mg/dL — ABNORMAL HIGH (ref 70–99)
Potassium: 4.2 mEq/L (ref 3.5–5.1)
Sodium: 136 mEq/L (ref 135–145)
Total Bilirubin: 0.4 mg/dL (ref 0.2–1.2)
Total Protein: 6.9 g/dL (ref 6.0–8.3)

## 2020-05-10 LAB — LIPID PANEL
Cholesterol: 116 mg/dL (ref 0–200)
HDL: 71.3 mg/dL (ref >39.00)
LDL Cholesterol: 34 mg/dL (ref 0–99)
NonHDL: 44.45
Total CHOL/HDL Ratio: 2
Triglycerides: 53 mg/dL (ref 0.0–149.0)
VLDL: 10.6 mg/dL (ref 0.0–40.0)

## 2020-05-10 LAB — HEMOGLOBIN A1C: Hgb A1c MFr Bld: 6.7 % — ABNORMAL HIGH (ref 4.6–6.5)

## 2020-05-11 ENCOUNTER — Other Ambulatory Visit: Payer: PPO

## 2020-05-14 ENCOUNTER — Other Ambulatory Visit: Payer: Self-pay | Admitting: Endocrinology

## 2020-05-15 ENCOUNTER — Ambulatory Visit: Payer: PPO | Admitting: Nurse Practitioner

## 2020-05-15 ENCOUNTER — Other Ambulatory Visit: Payer: Self-pay

## 2020-05-15 ENCOUNTER — Encounter: Payer: Self-pay | Admitting: Nurse Practitioner

## 2020-05-15 VITALS — BP 118/80 | Ht 62.0 in | Wt 146.0 lb

## 2020-05-15 DIAGNOSIS — Z90710 Acquired absence of both cervix and uterus: Secondary | ICD-10-CM | POA: Diagnosis not present

## 2020-05-15 DIAGNOSIS — Z780 Asymptomatic menopausal state: Secondary | ICD-10-CM | POA: Diagnosis not present

## 2020-05-15 DIAGNOSIS — Z79890 Hormone replacement therapy (postmenopausal): Secondary | ICD-10-CM

## 2020-05-15 DIAGNOSIS — Z9079 Acquired absence of other genital organ(s): Secondary | ICD-10-CM | POA: Insufficient documentation

## 2020-05-15 DIAGNOSIS — M810 Age-related osteoporosis without current pathological fracture: Secondary | ICD-10-CM | POA: Diagnosis not present

## 2020-05-15 DIAGNOSIS — R350 Frequency of micturition: Secondary | ICD-10-CM

## 2020-05-15 DIAGNOSIS — Z01419 Encounter for gynecological examination (general) (routine) without abnormal findings: Secondary | ICD-10-CM | POA: Diagnosis not present

## 2020-05-15 DIAGNOSIS — Z90722 Acquired absence of ovaries, bilateral: Secondary | ICD-10-CM | POA: Diagnosis not present

## 2020-05-15 MED ORDER — DENOSUMAB 60 MG/ML ~~LOC~~ SOSY
60.0000 mg | PREFILLED_SYRINGE | Freq: Once | SUBCUTANEOUS | Status: AC
  Administered 2020-05-15: 60 mg via SUBCUTANEOUS

## 2020-05-15 MED ORDER — ESTRADIOL 0.025 MG/24HR TD PTWK: 0.0250 mg | MEDICATED_PATCH | TRANSDERMAL | 4 refills | Status: DC

## 2020-05-15 NOTE — Progress Notes (Signed)
   [name] May 07, 1941 [id number]   History:  79 y.o. G3P3003 presents for breast and pelvic exam. TAH BSO on Climara patch 0.025 mg weekly for hot flashes. Complains of nighttime urinary frequency, denies dysuria, and urgency. Normal pap and mammogram history. History of Osteoporosis, on Prolia. Most recent dose today. T2DM managed by endocrinology.   Gynecologic History No LMP recorded. Patient has had a hysterectomy.   Last Pap: No longer screening for guidelines Last mammogram: 06/14/2019. Results were: normal Last colonoscopy: 12/05/2015. Results were: normal Last Dexa: 06/01/2018. Results were: t-score -2.9  Past medical history, past surgical history, family history and social history were all reviewed and documented in the EPIC chart.  ROS:  A ROS was performed and pertinent positives and negatives are included.  Exam:  Vitals:   05/15/20 1605  BP: 118/80  Weight: 146 lb (66.2 kg)  Height: 5\' 2"  (1.575 m)   Body mass index is 26.7 kg/m.  General appearance:  Normal Thyroid:  Symmetrical, normal in size, without palpable masses or nodularity. Respiratory  Auscultation:  Clear without wheezing or rhonchi Cardiovascular  Auscultation:  Regular rate, without rubs, murmurs or gallops  Edema/varicosities:  Not grossly evident Abdominal  Soft,nontender, without masses, guarding or rebound.  Liver/spleen:  No organomegaly noted  Hernia:  None appreciated  Skin  Inspection:  Grossly normal   Breasts: Examined lying and sitting.   Right: Without masses, retractions, discharge or axillary adenopathy.   Left: Without masses, retractions, discharge or axillary adenopathy. Gentitourinary   Inguinal/mons:  Normal without inguinal adenopathy  External genitalia:  Normal  BUS/Urethra/Skene's glands:  Normal  Vagina:  Atrophy, no contraction during digital pelvic exam  Cervix:  Absent  Uterus: Absent  Adnexa/parametria:     Rt: Without masses or  tenderness.   Lt: Without masses or tenderness.  Anus and perineum: Normal  Digital rectal exam: Normal sphincter tone without palpated masses or tenderness  Assessment/Plan:  79 y.o. N5A2130 for breast and pelvic ex  Well female exam with routine gynecological exam - Education provided on SBEs, importance of preventative screenings, current guidelines, high calcium diet, regular exercise, and multivitamin daily. Labs done elsewhere.   Hormone replacement therapy (HRT) - Climara 0.025 mg patch weekly with good relief of hot flashes. She is aware of the risk for blood clots, heart attack, stroke, and breast cancer. She would like to continue.  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 Most recent October 2019 t-score -2.9. On Prolia. Most recent dose today. Due for DEXA next month.   Postmenopausal - Plan: DG Bone Density  Urinary frequency - only nocturnal and most likely from relaxation of vaginal muscles during sleep. Denies frequency during the daytime. Recommend no fluids 3 hours before bed. Kegel exercises to strengthen pelvic floor muscles as she has significant vaginal weakness.   Follow up in 1 year for annual     [place], 4:22 PM 05/15/2020

## 2020-05-15 NOTE — Patient Instructions (Signed)
Health Maintenance After Age 79 After age 79, you are at a higher risk for certain long-term diseases and infections as well as injuries from falls. Falls are a major cause of broken bones and head injuries in people who are older than age 79. Getting regular preventive care can help to keep you healthy and well. Preventive care includes getting regular testing and making lifestyle changes as recommended by your health care provider. Talk with your health care provider about:  Which screenings and tests you should have. A screening is a test that checks for a disease when you have no symptoms.  A diet and exercise plan that is right for you. What should I know about screenings and tests to prevent falls? Screening and testing are the best ways to find a health problem early. Early diagnosis and treatment give you the best chance of managing medical conditions that are common after age 79. Certain conditions and lifestyle choices may make you more likely to have a fall. Your health care provider may recommend:  Regular vision checks. Poor vision and conditions such as cataracts can make you more likely to have a fall. If you wear glasses, make sure to get your prescription updated if your vision changes.  Medicine review. Work with your health care provider to regularly review all of the medicines you are taking, including over-the-counter medicines. Ask your health care provider about any side effects that may make you more likely to have a fall. Tell your health care provider if any medicines that you take make you feel dizzy or sleepy.  Osteoporosis screening. Osteoporosis is a condition that causes the bones to get weaker. This can make the bones weak and cause them to break more easily.  Blood pressure screening. Blood pressure changes and medicines to control blood pressure can make you feel dizzy.  Strength and balance checks. Your health care provider may recommend certain tests to check your  strength and balance while standing, walking, or changing positions.  Foot health exam. Foot pain and numbness, as well as not wearing proper footwear, can make you more likely to have a fall.  Depression screening. You may be more likely to have a fall if you have a fear of falling, feel emotionally low, or feel unable to do activities that you used to do.  Alcohol use screening. Using too much alcohol can affect your balance and may make you more likely to have a fall. What actions can I take to lower my risk of falls? General instructions  Talk with your health care provider about your risks for falling. Tell your health care provider if: ? You fall. Be sure to tell your health care provider about all falls, even ones that seem minor. ? You feel dizzy, sleepy, or off-balance.  Take over-the-counter and prescription medicines only as told by your health care provider. These include any supplements.  Eat a healthy diet and maintain a healthy weight. A healthy diet includes low-fat dairy products, low-fat (lean) meats, and fiber from whole grains, beans, and lots of fruits and vegetables. Home safety  Remove any tripping hazards, such as rugs, cords, and clutter.  Install safety equipment such as grab bars in bathrooms and safety rails on stairs.  Keep rooms and walkways well-lit. Activity   Follow a regular exercise program to stay fit. This will help you maintain your balance. Ask your health care provider what types of exercise are appropriate for you.  If you need a cane or   walker, use it as recommended by your health care provider.  Wear supportive shoes that have nonskid soles. Lifestyle  Do not drink alcohol if your health care provider tells you not to drink.  If you drink alcohol, limit how much you have: ? 0-1 drink a day for women. ? 0-2 drinks a day for men.  Be aware of how much alcohol is in your drink. In the [place] one drink equals one typical bottle of beer (12  oz), one-half glass of wine (5 oz), or one shot of hard liquor (1 oz).  Do not use any products that contain nicotine or tobacco, such as cigarettes and e-cigarettes. If you need help quitting, ask your health care provider. Summary  Having a healthy lifestyle and getting preventive care can help to protect your health and wellness after age 79.  Screening and testing are the best way to find a health problem early and help you avoid having a fall. Early diagnosis and treatment give you the best chance for managing medical conditions that are more common for people who are older than age 79.  Falls are a major cause of broken bones and head injuries in people who are older than age 79. Take precautions to prevent a fall at home.  Work with your health care provider to learn what changes you can make to improve your health and wellness and to prevent falls. This information is not intended to replace advice given to you by your health care provider. Make sure you discuss any questions you have with your health care provider. Document Revised: 12/03/2018 Document Reviewed: 06/25/2017 [employer] Patient Education  2020 [employer]  

## 2020-05-16 ENCOUNTER — Ambulatory Visit: Payer: PPO | Admitting: Endocrinology

## 2020-05-16 VITALS — BP 142/80 | HR 94 | Ht 62.0 in | Wt 147.0 lb

## 2020-05-16 DIAGNOSIS — E785 Hyperlipidemia, unspecified: Secondary | ICD-10-CM | POA: Diagnosis not present

## 2020-05-16 DIAGNOSIS — E1165 Type 2 diabetes mellitus with hyperglycemia: Secondary | ICD-10-CM | POA: Diagnosis not present

## 2020-05-16 DIAGNOSIS — N289 Disorder of kidney and ureter, unspecified: Secondary | ICD-10-CM | POA: Diagnosis not present

## 2020-05-16 MED ORDER — ONETOUCH DELICA LANCING DEV MISC: 0 refills | Status: DC

## 2020-05-16 MED ORDER — PIOGLITAZONE HCL 30 MG PO TABS: 30.0000 mg | ORAL_TABLET | Freq: Every day | ORAL | 1 refills | Status: DC
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
## 2020-05-16 NOTE — Patient Instructions (Addendum)
Check blood sugars on waking up 2 days a week  Also check blood sugars about 2 hours after meals and do this after different meals by rotation  Recommended blood sugar levels on waking up are 90-130 and about 2 hours after meal INCLUDING AFTER BREAKFAST is 130-180  Please bring your blood sugar monitor to each visit, thank you

## 2020-05-17 ENCOUNTER — Ambulatory Visit: Payer: PPO | Admitting: Internal Medicine

## 2020-05-17 ENCOUNTER — Telehealth: Payer: Self-pay | Admitting: Neurology

## 2020-05-17 ENCOUNTER — Encounter: Payer: Self-pay | Admitting: Neurology

## 2020-05-17 ENCOUNTER — Ambulatory Visit: Payer: PPO | Admitting: Neurology

## 2020-05-17 ENCOUNTER — Other Ambulatory Visit: Payer: Self-pay

## 2020-05-17 VITALS — BP 118/58 | HR 84 | Ht 62.0 in | Wt 147.2 lb

## 2020-05-17 DIAGNOSIS — R258 Other abnormal involuntary movements: Secondary | ICD-10-CM

## 2020-05-17 DIAGNOSIS — R519 Headache, unspecified: Secondary | ICD-10-CM

## 2020-05-17 DIAGNOSIS — R251 Tremor, unspecified: Secondary | ICD-10-CM | POA: Diagnosis not present

## 2020-05-17 DIAGNOSIS — G20 Parkinson's disease: Secondary | ICD-10-CM | POA: Diagnosis not present

## 2020-05-17 NOTE — Telephone Encounter (Signed)
Health team order sent to GI. No auth they will reach out to the patient to schedule.  
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
## 2020-05-17 NOTE — Patient Instructions (Addendum)
DAT Scan MRI brain Bloodwork today  Brain DaTscan How to prepare and what to expect ?????????????????????????????????????What is a brain DaTscan? A brain DaTscan is a nuclear medicine scan. It uses radioactive material to diagnose some diseases of the brain, especially those that cause tremor (shakiness). DaTscan is a brand name for a drug called ioflupane I-123. A brain DaTscan is a form of radiology, because radiation is used to take pictures of the body. This radioactive drug is ordered especially for you. Because of this, we need at least 72 hours' notice if you must cancel or reschedule your scan.   How does the scan work? You will be given a small dose of tracer (radioactive material) through an intravenous (IV) line. This tracer will collect in part of your brain and give off gamma rays. A special camera called a gamma camera will use these rays to produce pictures and measurements of your brain. How do I prepare? Some drugs will affect the results of your brain DaTscan. You will need to stop taking these drugs before your scan. The table on page 2 lists the drugs that need to be stopped, and for how many days before your scan. This list is in alphabetical order by the generic name of the drug. The common brand names are listed beneath the generic name. Please confirm these instructions with your doctor who prescribed the drug. Drugs to Stop Taking Before your scan, stop taking these medicines for the length of time shown: Name of Drug Stop Taking  Amoxapine 4 days before  Benztropine  Cogentin 3 days before  Bupropion (Aplenzin, Budeprion, Voxra, Wellbutrin, Zyban) 48 hours before  Buspirone 15 hours before  Citalopram 24 hours before  Cocaine 6 hours before  Escitalopram 24 hours before  Methamphetamine 24 hours before  Methylphenidate (Concerta, Metadate, Methylin, Ritalin) 20 hours before  Paroxetine 24 hours before  Selegilene 48 hours before  Sertraline 3 days before  If  you are breastfeeding, or if there is any chance you are pregnant, please tell the scheduler or technologist (the person who will help you prepare for your scan). How is the scan done? When you first arrive, we will ask you to drink a small cup of water with potassium iodine in it. This water may have a metallic taste.  An hour after you drink the potassium iodine water, the technologist will inject a small amount of tracer into a vein in your arm or hand through your IV.  You must stay in the department for 30 minutes after the injection.  You will then have a break for 3 hours. It is OK to eat and drink during this break.  You must return to the clinic after this 3-hour break to have images of your brain taken.  Then, 4 hours after you receive your tracer injection, the technologist will take images of your brain with the gamma camera. You will lie flat on the exam table while these images are being taken.  You must not move while the camera is taking pictures. If you move, the pictures will be blurry and may have to be taken again.  Taking the images will take 40 to 45 minutes. Your total time in the imaging room will be about 1 hour.  You may also have a low-dose CT scan of your brain to help confirm any results. A CT scan is another way to take images inside your body.  It will take about 5 hours from the time you drink the  potassium iodine water until the scans are complete. What will I feel during the scan? The technologist will help make you as comfortable as possible on the exam table for the scan.  You may feel some minor discomfort from the IV.  Lying still on the exam table may be hard for some patients.  The camera will be close to your head. This may make you feel confined or uneasy (claustrophobic). Please tell the doctor who referred you for this scan if you know you are claustrophobic. Are there any side effects from the scan? Most of the radioactivity from the tracer will pass  out of your body in your urine or stool. The rest simply goes away over time.  Bad reactions to this scan are very rare. Fewer than 1% of patients (fewer than 1 out of 100) have a bad reaction. Reactions may include headache, nausea, vertigo (dizziness), or dry mouth. How do I get the results? When the test is over, the nuclear medicine doctor will review your images, prepare a written report, and talk with your doctor about the results. Your doctor will then talk with you about the results and your treatment options. If you needed to stop taking any medicines on the day of your scan, ask your doctor when to start taking them again.  The potentially interfering drugs consist of: amoxapine, amphetamine, benztropine, bupropion, buspirone, citalopram, cocaine, mazindol, methamphetamine, methylphenidate, norephedrine, phentermine, escitalopram, phenylpropanolamine, selegiline, paroxetine, and sertraline     Tremor A tremor is trembling or shaking that you cannot control. Most tremors affect the hands or arms. Tremors can also affect the head, vocal cords, face, and other parts of the body. There are many types of tremors. Common types include:  Essential tremor. These usually occur in people older than 40. It may run in families and can happen in otherwise healthy people.  Resting tremor. These occur when the muscles are at rest, such as when your hands are resting in your lap. People with Parkinson's disease often have resting tremors.  Postural tremor. These occur when you try to hold a pose, such as keeping your hands outstretched.  Kinetic tremor. These occur during purposeful movement, such as trying to touch a finger to your nose.  Task-specific tremor. These may occur when you perform certain tasks such as writing, speaking, or standing.  Psychogenic tremor. These dramatically lessen or disappear when you are distracted. They can happen in people of all ages. Some types of tremors have no  known cause. Tremors can also be a symptom of nervous system problems (neurological disorders) that may occur with aging. Some tremors go away with treatment, while others do not. Follow these instructions at home: Lifestyle      Limit alcohol intake to no more than 1 drink a day for nonpregnant women and 2 drinks a day for men. One drink equals 12 oz of beer, 5 oz of wine, or 1 oz of hard liquor.  Do not use any products that contain nicotine or tobacco, such as cigarettes and e-cigarettes. If you need help quitting, ask your health care provider.  Avoid extreme heat and extreme cold.  Limit your caffeine intake, as told by your health care provider.  Try to get 8 hours of sleep each night.  Find ways to manage your stress, such as meditation or yoga. General instructions  Take over-the-counter and prescription medicines only as told by your health care provider.  Keep all follow-up visits as told by your health care  provider. This is important. Contact a health care provider if you:  Develop a tremor after starting a new medicine.  Have a tremor along with other symptoms such as: ? Numbness. ? Tingling. ? Pain. ? Weakness.  Notice that your tremor gets worse.  Notice that your tremor interferes with your day-to-day life. Summary  A tremor is trembling or shaking that you cannot control.  Most tremors affect the hands or arms.  Some types of tremors have no known cause. Others may be a symptom of nervous system problems (neurological disorders).  Make sure you discuss any tremors you have with your health care provider. This information is not intended to replace advice given to you by your health care provider. Make sure you discuss any questions you have with your health care provider. Document Revised: 07/25/2017 Document Reviewed: 06/12/2017 Elsevier Patient Education  2020 [employer].

## 2020-05-18 LAB — SEDIMENTATION RATE: Sed Rate: 3 mm/hr (ref 0–40)

## 2020-05-18 LAB — C-REACTIVE PROTEIN: CRP: 1 mg/L (ref 0–10)

## 2020-05-18 LAB — TSH: TSH: 1.41 u[IU]/mL (ref 0.450–4.500)

## 2020-05-23 ENCOUNTER — Encounter: Payer: Self-pay | Admitting: Internal Medicine

## 2020-05-23 ENCOUNTER — Ambulatory Visit (INDEPENDENT_AMBULATORY_CARE_PROVIDER_SITE_OTHER): Payer: PPO | Admitting: Internal Medicine

## 2020-05-23 ENCOUNTER — Other Ambulatory Visit: Payer: Self-pay

## 2020-05-23 VITALS — BP 142/80 | HR 96 | Temp 98.1°F | Ht 62.0 in | Wt 146.0 lb

## 2020-05-23 DIAGNOSIS — R251 Tremor, unspecified: Secondary | ICD-10-CM

## 2020-05-23 DIAGNOSIS — Z23 Encounter for immunization: Secondary | ICD-10-CM

## 2020-05-23 DIAGNOSIS — Z6830 Body mass index (BMI) 30.0-30.9, adult: Secondary | ICD-10-CM

## 2020-05-23 DIAGNOSIS — E6609 Other obesity due to excess calories: Secondary | ICD-10-CM | POA: Diagnosis not present

## 2020-05-23 DIAGNOSIS — R413 Other amnesia: Secondary | ICD-10-CM

## 2020-05-23 DIAGNOSIS — E1142 Type 2 diabetes mellitus with diabetic polyneuropathy: Secondary | ICD-10-CM | POA: Diagnosis not present

## 2020-05-23 NOTE — Assessment & Plan Note (Signed)
Wt Readings from Last 3 Encounters:  05/23/20 146 lb (66.2 kg)  05/17/20 147 lb 3.2 oz (66.8 kg)  05/16/20 147 lb (66.7 kg)

## 2020-05-23 NOTE — Assessment & Plan Note (Signed)
Try Lion's Mane Mushroom extract or capsules for memory

## 2020-05-23 NOTE — Addendum Note (Signed)
Addended by: Darlys Gales on: 05/23/2020 02:04 PM   Modules accepted: Orders

## 2020-05-23 NOTE — Assessment & Plan Note (Signed)
Actos, Prandin, Victoza

## 2020-05-23 NOTE — Patient Instructions (Signed)
You can try Lion's Mane Mushroom extract or capsules for memory   

## 2020-05-23 NOTE — Progress Notes (Signed)
Subjective:  Patient ID: [name], female    DOB: 1941/03/06  Age: 79 y.o. MRN: [id number]  CC: No chief complaint on file.   HPI [name] presents for depression, DM, HTN f/u C/o poor memory at times Taking naps  Outpatient Medications Prior to Visit  Medication Sig Dispense Refill  . aspirin EC 81 MG tablet Take 1 tablet (81 mg total) by mouth daily. 100 tablet 3  . b complex vitamins tablet Take 1 tablet by mouth daily. Vitamin supplement    . BD PEN NEEDLE NANO U/F 32G X 4 MM MISC USE AS DIRECTED TWICE DAILY. 200 each 3  . Blood Glucose Monitoring Suppl ([place]) w/Device KIT Use as instructed to check blood sugar 5 times daily. 1 kit 0  . Brexpiprazole (REXULTI) 1 MG TABS Take 1 mg by mouth. Every third day (Monday & Thursday)    . busPIRone (BUSPAR) 15 MG tablet Take 1 tablet by mouth 2 (two) times daily.    . calcium-vitamin D (OSCAL WITH D) 500-200 MG-UNIT per tablet Take 1 tablet by mouth daily with breakfast.    . cetirizine (ZYRTEC) 10 MG tablet Take 10 mg by mouth daily as needed for allergies.     . cholecalciferol (VITAMIN D) 1000 units tablet Take 2,000 Units by mouth daily.    Marland Kitchen dicyclomine (BENTYL) 10 MG capsule Take 1 capsule (10 mg total) by mouth 2 (two) times daily as needed for spasms. 180 capsule 4  . estradiol (CLIMARA - DOSED IN MG/24 HR) 0.025 mg/24hr patch Place 1 patch (0.025 mg total) onto the skin once a week. APPLY 1 PATCH ONTO SKIN ONCE WEEKLY 12 patch 4  . famotidine (PEPCID) 20 MG tablet Take 1 tablet (20 mg total) by mouth at bedtime. 90 tablet 4  . L-Methylfolate (DEPLIN) 15 MG TABS Take 15 mg by mouth daily. For treatment of depression    . Lancet Devices (ONE TOUCH DELICA LANCING DEV) MISC Use daily as directed 1 each 0  . Liniments (SALONPAS PAIN RELIEF PATCH EX) Apply 1 patch topically at bedtime.    Marland Kitchen LORazepam (ATIVAN) 1 MG tablet Take 1 mg by mouth 4 (four) times daily as needed for anxiety.   2  . lovastatin  (MEVACOR) 20 MG tablet TAKE 1 TABLET (20 MG TOTAL) BY MOUTH AT BEDTIME. NEED APPT W/LIPIDS CHECK BEFORE FUTURE REFILLS 30 tablet 11  . Magnesium 500 MG CAPS Take 1 capsule by mouth daily.    . metFORMIN (GLUCOPHAGE) 500 MG tablet TAKE 1 TABLET BY MOUTH IN THE MORNING AND 2 TABLETS IN THE EVENING 270 tablet 0  . Multiple Vitamins-Minerals (MULTIVITAMIN,TX-MINERALS) tablet Take 1 tablet by mouth daily.     . ondansetron (ZOFRAN) 4 MG tablet Take 1 tablet (4 mg total) by mouth 2 (two) times daily. TAKE 1 TABLET BY MOUTH EVERY MORNING AND EVENING AS NEEDED. NEED OFFICE VISIT FOR REFILLS 180 tablet 4  . ONETOUCH DELICA LANCETS 69C MISC USE UP TO 4 TIMES A DAY 200 each 3  . ONETOUCH VERIO test strip USE TO CHECK BLOOD SUGARS 5 TIMES DAILY. 450 each 3  . pantoprazole (PROTONIX) 40 MG tablet Take 1 tablet (40 mg total) by mouth 2 (two) times daily. 180 tablet 4  . pioglitazone (ACTOS) 30 MG tablet Take 1 tablet (30 mg total) by mouth daily. 90 tablet 1  . repaglinide (PRANDIN) 1 MG tablet 2 Before Bfst and 1 at dinner 270 tablet 1  .  SUMAtriptan (IMITREX) 100 MG tablet 100 mg as needed.    . traMADol (ULTRAM) 50 MG tablet Take 1 tablet (50 mg total) by mouth every 12 (twelve) hours as needed for severe pain. 180 tablet 1  . TRINTELLIX 20 MG TABS Take 20 mg by mouth daily.  2  . VICTOZA 18 MG/3ML SOPN INJECT 1.$RemoveBefor'2MG'yTnCLQMSVDuP$  INTO MUSCLE DAILY 18 mL 2  . VOLTAREN 1 % GEL APPLY 2 GRAMS TO KNEE 2-3 TIMES DAILY PRN  1   No facility-administered medications prior to visit.    ROS: Review of Systems  Constitutional: Positive for fatigue. Negative for activity change, appetite change, chills and unexpected weight change.  HENT: Negative for congestion, mouth sores and sinus pressure.   Eyes: Negative for visual disturbance.  Respiratory: Negative for cough and chest tightness.   Gastrointestinal: Negative for abdominal pain and nausea.  Genitourinary: Negative for difficulty urinating, frequency and vaginal pain.    Musculoskeletal: Positive for arthralgias and back pain. Negative for gait problem.  Skin: Negative for pallor and rash.  Neurological: Positive for tremors. Negative for dizziness, weakness, numbness and headaches.  Psychiatric/Behavioral: Positive for decreased concentration and dysphoric mood. Negative for confusion and sleep disturbance. The patient is nervous/anxious.     Objective:  BP (!) 142/80 (BP Location: Right Arm, Patient Position: Sitting, Cuff Size: Normal)   Pulse 96   Temp 98.1 F (36.7 C) (Oral)   Ht $R'5\' 2"'Og$  (1.575 m)   Wt 146 lb (66.2 kg)   SpO2 95%   BMI 26.70 kg/m   BP Readings from Last 3 Encounters:  05/23/20 (!) 142/80  05/17/20 (!) 118/58  05/16/20 (!) 142/80    Wt Readings from Last 3 Encounters:  05/23/20 146 lb (66.2 kg)  05/17/20 147 lb 3.2 oz (66.8 kg)  05/16/20 147 lb (66.7 kg)    Physical Exam Constitutional:      General: She is not in acute distress.    Appearance: She is well-developed.  HENT:     Head: Normocephalic.     Right Ear: External ear normal.     Left Ear: External ear normal.     Nose: Nose normal.  Eyes:     General:        Right eye: No discharge.        Left eye: No discharge.     Conjunctiva/sclera: Conjunctivae normal.     Pupils: Pupils are equal, round, and reactive to light.  Neck:     Thyroid: No thyromegaly.     Vascular: No JVD.     Trachea: No tracheal deviation.  Cardiovascular:     Rate and Rhythm: Normal rate and regular rhythm.     Heart sounds: Normal heart sounds.  Pulmonary:     Effort: No respiratory distress.     Breath sounds: No stridor. No wheezing.  Abdominal:     General: Bowel sounds are normal. There is no distension.     Palpations: Abdomen is soft. There is no mass.     Tenderness: There is no abdominal tenderness. There is no guarding or rebound.  Musculoskeletal:        General: No tenderness.     Cervical back: Normal range of motion and neck supple.  Lymphadenopathy:      Cervical: No cervical adenopathy.  Skin:    Findings: No erythema or rash.  Neurological:     Mental Status: She is oriented to person, place, and time.     Cranial Nerves: No cranial  nerve deficit.     Motor: No abnormal muscle tone.     Coordination: Coordination abnormal.     Gait: Gait abnormal.     Deep Tendon Reflexes: Reflexes normal.  Psychiatric:        Behavior: Behavior normal.        Thought Content: Thought content normal.        Judgment: Judgment normal.   tremor Rare blinking  Lab Results  Component Value Date   WBC 7.1 03/13/2018   HGB 10.8 (L) 03/13/2018   HCT 33.2 (L) 03/13/2018   PLT 412 03/13/2018   GLUCOSE 108 (H) 05/10/2020   CHOL 116 05/10/2020   TRIG 53.0 05/10/2020   HDL 71.30 05/10/2020   LDLCALC 34 05/10/2020   ALT 15 05/10/2020   AST 24 05/10/2020   NA 136 05/10/2020   K 4.2 05/10/2020   CL 100 05/10/2020   CREATININE 1.26 (H) 05/10/2020   BUN 19 05/10/2020   CO2 30 05/10/2020   TSH 1.410 05/17/2020   INR 0.93 11/02/2013   HGBA1C 6.7 (H) 05/10/2020   MICROALBUR 1.4 09/09/2019    MM DIAG BREAST TOMO UNI RIGHT  Result Date: 06/14/2019 CLINICAL DATA:  Screening recall for possible architectural distortion in the right breast. EXAM: DIGITAL DIAGNOSTIC UNILATERAL RIGHT MAMMOGRAM WITH CAD AND TOMO COMPARISON:  Previous exam(s). ACR Breast Density Category c: The breast tissue is heterogeneously dense, which may obscure small masses. FINDINGS: On the diagnostic spot-compression images, the possible distortion disperses consistent with normal superimposed fibroglandular tissue. There is no underlying true distortion. There are no masses. No suspicious calcifications. Mammographic images were processed with CAD. IMPRESSION: Negative exam.  No evidence of breast malignancy. RECOMMENDATION: Screening mammogram in one year.(Code:[id number]) I have discussed the findings and recommendations with the patient. If applicable, a reminder letter will be sent to  the patient regarding the next appointment. BI-RADS CATEGORY  1: Negative. Electronically Signed   By: Lajean Manes M.D.   On: 06/14/2019 14:41    Assessment & Plan:    Walker Kehr, MD

## 2020-05-23 NOTE — Assessment & Plan Note (Addendum)
Pt is  being checked for Parkinson's Dr Jaynee Eagles MRI DaTscan

## 2020-06-07 ENCOUNTER — Other Ambulatory Visit: Payer: Self-pay

## 2020-06-07 ENCOUNTER — Ambulatory Visit (INDEPENDENT_AMBULATORY_CARE_PROVIDER_SITE_OTHER): Payer: PPO

## 2020-06-07 ENCOUNTER — Other Ambulatory Visit: Payer: Self-pay | Admitting: Nurse Practitioner

## 2020-06-07 ENCOUNTER — Ambulatory Visit
Admission: RE | Admit: 2020-06-07 | Discharge: 2020-06-07 | Disposition: A | Discharge status: Home / Self Care | Payer: PPO | Source: Ambulatory Visit | Attending: Nurse Practitioner | Admitting: Nurse Practitioner

## 2020-06-07 DIAGNOSIS — Z780 Asymptomatic menopausal state: Secondary | ICD-10-CM

## 2020-06-07 DIAGNOSIS — M810 Age-related osteoporosis without current pathological fracture: Secondary | ICD-10-CM | POA: Diagnosis not present

## 2020-06-07 DIAGNOSIS — Z1231 Encounter for screening mammogram for malignant neoplasm of breast: Secondary | ICD-10-CM

## 2020-06-08 ENCOUNTER — Ambulatory Visit
Admission: RE | Admit: 2020-06-08 | Discharge: 2020-06-08 | Disposition: A | Discharge status: Home / Self Care | Payer: PPO | Source: Ambulatory Visit | Attending: Neurology | Admitting: Neurology

## 2020-06-08 DIAGNOSIS — R251 Tremor, unspecified: Secondary | ICD-10-CM

## 2020-06-08 DIAGNOSIS — R258 Other abnormal involuntary movements: Secondary | ICD-10-CM

## 2020-06-08 DIAGNOSIS — R519 Headache, unspecified: Secondary | ICD-10-CM | POA: Diagnosis not present

## 2020-06-08 DIAGNOSIS — G20 Parkinson's disease: Secondary | ICD-10-CM

## 2020-06-08 MED ORDER — GADOBENATE DIMEGLUMINE 529 MG/ML IV SOLN
13.0000 mL | Freq: Once | INTRAVENOUS | Status: AC | PRN
  Administered 2020-06-08: 13 mL via INTRAVENOUS

## 2020-06-12 ENCOUNTER — Telehealth: Payer: Self-pay | Admitting: *Deleted

## 2020-06-12 NOTE — Telephone Encounter (Signed)
I called the pt and discussed MRI brain results per Dr Jaynee Eagles. Pt aware of unremarkable findings that can be seen with aging. She is aware that Dr Jaynee Eagles does want her to proceed with DAT scan. It has not been scheduled yet. Insurance auth likely still in progress. I let the pt know I would send a message to Hinton Dyer to ensure Josem Kaufmann is going as expected. Pt verbalized appreciation and her questions were answered.

## 2020-06-12 NOTE — Telephone Encounter (Signed)
-----   Message from Melvenia Beam, MD sent at 06/11/2020  9:56 PM EDT ----- MRI of the brain shows some atrophy and white-matter changes which can be seen in normal aging. I think she needs to proceed to the DAT scan whoich was ordered can you check and see if they have scheduled it? thanks

## 2020-06-24 DIAGNOSIS — G4733 Obstructive sleep apnea (adult) (pediatric): Secondary | ICD-10-CM | POA: Diagnosis not present

## 2020-06-29 DIAGNOSIS — Z96651 Presence of right artificial knee joint: Secondary | ICD-10-CM | POA: Diagnosis not present

## 2020-07-05 ENCOUNTER — Ambulatory Visit: Payer: PPO | Admitting: Family Medicine

## 2020-07-05 DIAGNOSIS — G43709 Chronic migraine without aura, not intractable, without status migrainosus: Secondary | ICD-10-CM

## 2020-07-05 NOTE — Progress Notes (Signed)
She is doing well, today. She reports headaches are very well managed. She is working with psychiatry to reduce Rexulti dose due to concerns of parkinsonism. MRI was unremarkable. She will consider DaT if needed.   Consent Form Botulism Toxin Injection For Chronic Migraine    Reviewed orally with patient, additionally signature is on file:  Botulism toxin has been approved by the Federal drug administration for treatment of chronic migraine. Botulism toxin does not cure chronic migraine and it may not be effective in some patients.  The administration of botulism toxin is accomplished by injecting a small amount of toxin into the muscles of the neck and head. Dosage must be titrated for each individual. Any benefits resulting from botulism toxin tend to wear off after 3 months with a repeat injection required if benefit is to be maintained. Injections are usually done every 3-4 months with maximum effect peak achieved by about 2 or 3 weeks. Botulism toxin is expensive and you should be sure of what costs you will incur resulting from the injection.  The side effects of botulism toxin use for chronic migraine may include:   -Transient, and usually mild, facial weakness with facial injections  -Transient, and usually mild, head or neck weakness with head/neck injections  -Reduction or loss of forehead facial animation due to forehead muscle weakness  -Eyelid drooping  -Dry eye  -Pain at the site of injection or bruising at the site of injection  -Double vision  -Potential unknown long term risks   Contraindications: You should not have Botox if you are pregnant, nursing, allergic to albumin, have an infection, skin condition, or muscle weakness at the site of the injection, or have myasthenia gravis, Lambert-Eaton syndrome, or ALS.  It is also possible that as with any injection, there may be an allergic reaction or no effect from the medication. Reduced effectiveness after repeated  injections is sometimes seen and rarely infection at the injection site may occur. All care will be taken to prevent these side effects. If therapy is given over a long time, atrophy and wasting in the muscle injected may occur. Occasionally the patient's become refractory to treatment because they develop antibodies to the toxin. In this event, therapy needs to be modified.  I have read the above information and consent to the administration of botulism toxin.    BOTOX PROCEDURE NOTE FOR MIGRAINE HEADACHE  Contraindications and precautions discussed with patient(above). Aseptic procedure was observed and patient tolerated procedure. Procedure performed by Debbora Presto, FNP-C.   The condition has existed for more than 6 months, and pt does not have a diagnosis of ALS, Myasthenia Gravis or Lambert-Eaton Syndrome.  Risks and benefits of injections discussed and pt agrees to proceed with the procedure.  Written consent obtained  These injections are medically necessary. Pt  receives good benefits from these injections. These injections do not cause sedations or hallucinations which the oral therapies may cause.   Description of procedure:  The patient was placed in a sitting position. The standard protocol was used for Botox as follows, with 5 units of Botox injected at each site:  -Procerus muscle, midline injection  -Corrugator muscle, bilateral injection  -Frontalis muscle, bilateral injection, with 2 sites each side, medial injection was performed in the upper one third of the frontalis muscle, in the region vertical from the medial inferior edge of the superior orbital rim. The lateral injection was again in the upper one third of the forehead vertically above the lateral limbus  of the cornea, 1.5 cm lateral to the medial injection site.  -Temporalis muscle injection, 4 sites, bilaterally. The first injection was 3 cm above the tragus of the ear, second injection site was 1.5 cm to 3 cm up  from the first injection site in line with the tragus of the ear. The third injection site was 1.5-3 cm forward between the first 2 injection sites. The fourth injection site was 1.5 cm posterior to the second injection site. 5th site laterally in the temporalis  muscleat the level of the outer canthus.  -Occipitalis muscle injection, 3 sites, bilaterally. The first injection was done one half way between the occipital protuberance and the tip of the mastoid process behind the ear. The second injection site was done lateral and superior to the first, 1 fingerbreadth from the first injection. The third injection site was 1 fingerbreadth superiorly and medially from the first injection site.  -Cervical paraspinal muscle injection, 2 sites, bilaterally. The first injection site was 1 cm from the midline of the cervical spine, 3 cm inferior to the lower border of the occipital protuberance. The second injection site was 1.5 cm superiorly and laterally to the first injection site.  -Trapezius muscle injection was performed at 3 sites, bilaterally. The first injection site was in the upper trapezius muscle halfway between the inflection point of the neck, and the acromion. The second injection site was one half way between the acromion and the first injection site. The third injection was done between the first injection site and the inflection point of the neck.   Will return for repeat injection in 3 months.   A total of 200 units of Botox was prepared, 155 units of Botox was injected as documented above, any Botox not injected was wasted. The patient tolerated the procedure well, there were no complications of the above procedure.

## 2020-07-05 NOTE — Progress Notes (Signed)
Botox-100unitsx2 vials Lot: [id number] Expiration: 09/2022 NDC: 2258-3462-19   0.9% Sodium Chloride- 8mL total Lot: [id number] Expiration: 05/2022 NDC: 12929-090-30  Dx: Chronic Migraine B/B   Consent signed

## 2020-07-25 ENCOUNTER — Other Ambulatory Visit: Payer: Self-pay | Admitting: Endocrinology

## 2020-07-28 ENCOUNTER — Other Ambulatory Visit: Payer: Self-pay | Admitting: Endocrinology

## 2020-09-13 ENCOUNTER — Other Ambulatory Visit (INDEPENDENT_AMBULATORY_CARE_PROVIDER_SITE_OTHER): Payer: PPO

## 2020-09-13 ENCOUNTER — Other Ambulatory Visit: Payer: Self-pay

## 2020-09-13 DIAGNOSIS — E1165 Type 2 diabetes mellitus with hyperglycemia: Secondary | ICD-10-CM

## 2020-09-13 LAB — BASIC METABOLIC PANEL
BUN: 16 mg/dL (ref 6–23)
CO2: 31 mEq/L (ref 19–32)
Calcium: 9.6 mg/dL (ref 8.4–10.5)
Chloride: 101 mEq/L (ref 96–112)
Creatinine, Ser: 1.12 mg/dL (ref 0.40–1.20)
GFR: 46.91 mL/min — ABNORMAL LOW (ref >60.00)
Glucose, Bld: 81 mg/dL (ref 70–99)
Potassium: 4.2 mEq/L (ref 3.5–5.1)
Sodium: 137 mEq/L (ref 135–145)

## 2020-09-13 LAB — HEMOGLOBIN A1C: Hgb A1c MFr Bld: 6.6 % — ABNORMAL HIGH (ref 4.6–6.5)

## 2020-09-18 ENCOUNTER — Ambulatory Visit: Payer: PPO | Admitting: Endocrinology

## 2020-09-18 ENCOUNTER — Other Ambulatory Visit: Payer: Self-pay

## 2020-09-18 ENCOUNTER — Encounter: Payer: Self-pay | Admitting: Endocrinology

## 2020-09-18 VITALS — BP 136/84 | HR 106 | Ht 62.0 in | Wt 143.4 lb

## 2020-09-18 DIAGNOSIS — E118 Type 2 diabetes mellitus with unspecified complications: Secondary | ICD-10-CM | POA: Diagnosis not present

## 2020-09-18 LAB — MICROALBUMIN / CREATININE URINE RATIO
Creatinine,U: 87.1 mg/dL
Microalb Creat Ratio: 1.6 mg/g (ref 0.0–30.0)
Microalb, Ur: 1.4 mg/dL (ref 0.0–1.9)

## 2020-09-18 NOTE — Patient Instructions (Signed)
Take 1/2 Metformin at supper, 1 in am  Check blood sugars on waking up 1-2 days a week  Also check blood sugars about 2 hours after meals and do this after different meals by rotation  Recommended blood sugar levels on waking up are 90-130 and about 2 hours after meal is 130-180  Please bring your blood sugar monitor to each visit, thank you
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
## 2020-09-19 ENCOUNTER — Encounter: Payer: Self-pay | Admitting: Internal Medicine

## 2020-09-19 ENCOUNTER — Ambulatory Visit (INDEPENDENT_AMBULATORY_CARE_PROVIDER_SITE_OTHER): Payer: PPO | Admitting: Internal Medicine

## 2020-09-19 DIAGNOSIS — F3341 Major depressive disorder, recurrent, in partial remission: Secondary | ICD-10-CM | POA: Diagnosis not present

## 2020-09-19 DIAGNOSIS — M5440 Lumbago with sciatica, unspecified side: Secondary | ICD-10-CM

## 2020-09-19 DIAGNOSIS — E118 Type 2 diabetes mellitus with unspecified complications: Secondary | ICD-10-CM

## 2020-09-19 DIAGNOSIS — Z6830 Body mass index (BMI) 30.0-30.9, adult: Secondary | ICD-10-CM | POA: Diagnosis not present

## 2020-09-19 DIAGNOSIS — E6609 Other obesity due to excess calories: Secondary | ICD-10-CM

## 2020-09-19 DIAGNOSIS — G8929 Other chronic pain: Secondary | ICD-10-CM

## 2020-09-19 NOTE — Assessment & Plan Note (Signed)
On Trintellix, Buspar  °

## 2020-09-19 NOTE — Assessment & Plan Note (Signed)
Better  Dr Dwyane Dee reduced Metformin

## 2020-09-19 NOTE — Progress Notes (Signed)
Subjective:  Patient ID: [name], female    DOB: 1941-02-18  Age: 80 y.o. MRN: [id number]  CC: Follow-up (4 month f/u)   HPI [name] presents for HTN, DM, LBP f/u  Outpatient Medications Prior to Visit  Medication Sig Dispense Refill  . aspirin EC 81 MG tablet Take 1 tablet (81 mg total) by mouth daily. 100 tablet 3  . b complex vitamins tablet Take 1 tablet by mouth daily. Vitamin supplement    . BD PEN NEEDLE NANO U/F 32G X 4 MM MISC USE AS DIRECTED TWICE DAILY. 200 each 3  . Blood Glucose Monitoring Suppl ([place]) w/Device KIT Use as instructed to check blood sugar 5 times daily. 1 kit 0  . brexpiprazole (REXULTI) 1 MG TABS tablet Take 1 mg by mouth. Every third day (Monday)    . busPIRone (BUSPAR) 15 MG tablet Take 1 tablet by mouth 2 (two) times daily.    . calcium-vitamin D (OSCAL WITH D) 500-200 MG-UNIT per tablet Take 1 tablet by mouth daily with breakfast.    . cetirizine (ZYRTEC) 10 MG tablet Take 10 mg by mouth daily as needed for allergies.    . cholecalciferol (VITAMIN D) 1000 units tablet Take 2,000 Units by mouth daily.    Marland Kitchen dicyclomine (BENTYL) 10 MG capsule Take 1 capsule (10 mg total) by mouth 2 (two) times daily as needed for spasms. 180 capsule 4  . estradiol (CLIMARA - DOSED IN MG/24 HR) 0.025 mg/24hr patch Place 1 patch (0.025 mg total) onto the skin once a week. APPLY 1 PATCH ONTO SKIN ONCE WEEKLY 12 patch 4  . famotidine (PEPCID) 20 MG tablet Take 1 tablet (20 mg total) by mouth at bedtime. 90 tablet 4  . L-Methylfolate (DEPLIN) 15 MG TABS Take 15 mg by mouth daily. For treatment of depression    . Lancet Devices (ONE TOUCH DELICA LANCING DEV) MISC Use daily as directed 1 each 0  . Liniments (SALONPAS PAIN RELIEF PATCH EX) Apply 1 patch topically at bedtime.    Marland Kitchen LORazepam (ATIVAN) 1 MG tablet Take 1 mg by mouth 4 (four) times daily as needed for anxiety.   2  . lovastatin (MEVACOR) 20 MG tablet TAKE 1 TABLET (20 MG TOTAL) BY  MOUTH AT BEDTIME. NEED APPT W/LIPIDS CHECK BEFORE FUTURE REFILLS 30 tablet 11  . Magnesium 500 MG CAPS Take 1 capsule by mouth daily.    . metFORMIN (GLUCOPHAGE) 500 MG tablet TAKE 1 TABLET BY MOUTH IN THE MORNING AND 2 TABLETS IN THE EVENING (Patient taking differently: Take 1/2 tablet by mouth in the morning and 1 tablets in the evening.) 270 tablet 0  . Multiple Vitamins-Minerals (MULTIVITAMIN,TX-MINERALS) tablet Take 1 tablet by mouth daily.    . ondansetron (ZOFRAN) 4 MG tablet Take 1 tablet (4 mg total) by mouth 2 (two) times daily. TAKE 1 TABLET BY MOUTH EVERY MORNING AND EVENING AS NEEDED. NEED OFFICE VISIT FOR REFILLS 180 tablet 4  . ONETOUCH DELICA LANCETS 59Y MISC USE UP TO 4 TIMES A DAY 200 each 3  . ONETOUCH VERIO test strip USE TO CHECK BLOOD SUGARS 5 TIMES DAILY. 450 each 3  . pantoprazole (PROTONIX) 40 MG tablet Take 1 tablet (40 mg total) by mouth 2 (two) times daily. 180 tablet 4  . pioglitazone (ACTOS) 30 MG tablet Take 1 tablet (30 mg total) by mouth daily. 90 tablet 1  . repaglinide (PRANDIN) 1 MG tablet TAKE 2 TABLETS BY MOUTH BEFORE  BREAKFAST AND 1 TABLET AT DINNER 270 tablet 1  . SUMAtriptan (IMITREX) 100 MG tablet 100 mg as needed.    . traMADol (ULTRAM) 50 MG tablet Take 1 tablet (50 mg total) by mouth every 12 (twelve) hours as needed for severe pain. 180 tablet 1  . TRINTELLIX 20 MG TABS Take 20 mg by mouth daily.  2  . VICTOZA 18 MG/3ML SOPN INJECT 1.2MG INTO MUSCLE DAILY 18 mL 0  . VOLTAREN 1 % GEL APPLY 2 GRAMS TO KNEE 2-3 TIMES DAILY PRN  1   No facility-administered medications prior to visit.    ROS: Review of Systems  Constitutional: Positive for fatigue. Negative for activity change, appetite change, chills and unexpected weight change.  HENT: Negative for congestion, mouth sores and sinus pressure.   Eyes: Negative for visual disturbance.  Respiratory: Negative for cough and chest tightness.   Gastrointestinal: Negative for abdominal pain and nausea.   Genitourinary: Negative for difficulty urinating, frequency and vaginal pain.  Musculoskeletal: Positive for arthralgias and back pain. Negative for gait problem.  Skin: Negative for pallor and rash.  Neurological: Negative for dizziness, tremors, weakness, numbness and headaches.  Psychiatric/Behavioral: Positive for decreased concentration. Negative for confusion, sleep disturbance and suicidal ideas. The patient is nervous/anxious.     Objective:  BP 130/72 (BP Location: Left Arm)   Pulse 100   Temp 97.7 F (36.5 C) (Oral)   Ht '5\' 2"'  (1.575 m)   Wt 142 lb (64.4 kg)   SpO2 97%   BMI 25.97 kg/m   BP Readings from Last 3 Encounters:  09/19/20 130/72  09/18/20 136/84  05/23/20 (!) 142/80    Wt Readings from Last 3 Encounters:  09/19/20 142 lb (64.4 kg)  09/18/20 143 lb 6.4 oz (65 kg)  05/23/20 146 lb (66.2 kg)    Physical Exam Constitutional:      General: She is not in acute distress.    Appearance: She is well-developed.  HENT:     Head: Normocephalic.     Right Ear: External ear normal.     Left Ear: External ear normal.     Nose: Nose normal.     Mouth/Throat:     Mouth: Oropharynx is clear and moist.  Eyes:     General:        Right eye: No discharge.        Left eye: No discharge.     Conjunctiva/sclera: Conjunctivae normal.     Pupils: Pupils are equal, round, and reactive to light.  Neck:     Thyroid: No thyromegaly.     Vascular: No JVD.     Trachea: No tracheal deviation.  Cardiovascular:     Rate and Rhythm: Normal rate and regular rhythm.     Heart sounds: Normal heart sounds.  Pulmonary:     Effort: No respiratory distress.     Breath sounds: No stridor. No wheezing.  Abdominal:     General: Bowel sounds are normal. There is no distension.     Palpations: Abdomen is soft. There is no mass.     Tenderness: There is no abdominal tenderness. There is no guarding or rebound.  Musculoskeletal:        General: No tenderness or edema.     Cervical  back: Normal range of motion and neck supple.  Lymphadenopathy:     Cervical: No cervical adenopathy.  Skin:    Findings: No erythema or rash.  Neurological:     Cranial Nerves: No  cranial nerve deficit.     Motor: No abnormal muscle tone.     Coordination: Coordination normal.     Deep Tendon Reflexes: Reflexes normal.  Psychiatric:        Mood and Affect: Mood and affect normal.        Behavior: Behavior normal.        Thought Content: Thought content normal.        Judgment: Judgment normal.     Lab Results  Component Value Date   WBC 7.1 03/13/2018   HGB 10.8 (L) 03/13/2018   HCT 33.2 (L) 03/13/2018   PLT 412 03/13/2018   GLUCOSE 81 09/13/2020   CHOL 116 05/10/2020   TRIG 53.0 05/10/2020   HDL 71.30 05/10/2020   LDLCALC 34 05/10/2020   ALT 15 05/10/2020   AST 24 05/10/2020   NA 137 09/13/2020   K 4.2 09/13/2020   CL 101 09/13/2020   CREATININE 1.12 09/13/2020   BUN 16 09/13/2020   CO2 31 09/13/2020   TSH 1.410 05/17/2020   INR 0.93 11/02/2013   HGBA1C 6.6 (H) 09/13/2020   MICROALBUR 1.4 09/18/2020    MR BRAIN W WO CONTRAST  Result Date: 06/09/2020  [facility] [address] [place], [place] [address] [phone] NEUROIMAGING REPORT STUDY DATE: 06/08/2020 PATIENT NAME: [name] DOB: [birth year] MRN: [id number] EXAM: MRI Brain with and without contrast ORDERING CLINICIAN: Sarina Ill, MD CLINICAL HISTORY: 80 year old woman with tremor and headaches COMPARISON FILMS: CT 05/20/2016 TECHNIQUE:MRI of the brain with and without contrast was obtained utilizing 5 mm axial slices with T1, T2, T2 flair, SWI and diffusion weighted views.  T1 sagittal, T2 coronal and postcontrast views in the axial and coronal plane were obtained. CONTRAST: 13 ml [facility] IMAGING SITE: [facility], [place]. FINDINGS: On sagittal images, the spinal cord is imaged caudally to C3-C4 and is normal in caliber.   The contents of the posterior  fossa are of normal size and position.   The pituitary gland and optic chiasm appear normal.    There is moderate generalized cortical atrophy and mild corpus callosum atrophy.  The atrophy has progressed compared to the 2017 CT scan there are no abnormal extra-axial collections of fluid.  In the hemispheres, there are many T2/FLAIR hyperintense foci in the deep, periventricular and subcortical white matter.  None of these appear to be acute.  The cerebellum and brainstem appears normal.   The deep gray matter appears normal.     Diffusion weighted images are normal.  Susceptibility weighted images are normal.   There have been bilateral lens replacements.  Otherwise, the orbits appear normal.   The VIIth/VIIIth nerve complex appears normal.  The mastoid air cells appear normal.  The paranasal sinuses appear normal.  Flow voids are identified within the major intracerebral arteries.  After the infusion of contrast material, a normal enhancement pattern is noted.  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   Moderate generalized cortical atrophy, progressed compared to the 2017 CT scan 2.   T2/FLAIR hyperintense foci throughout the hemispheres consistent with moderate chronic microvascular ischemic change. 3.   There were no acute findings and there was a normal enhancement pattern. INTERPRETING PHYSICIAN: Richard A. Felecia Shelling, MD, PhD, FAAN Certified in  Neuroimaging by [employer]    Assessment & Plan:    Walker Kehr, MD

## 2020-09-19 NOTE — Assessment & Plan Note (Signed)
Wt Readings from Last 3 Encounters:  09/19/20 142 lb (64.4 kg)  09/18/20 143 lb 6.4 oz (65 kg)  05/23/20 146 lb (66.2 kg)  On diet

## 2020-09-19 NOTE — Assessment & Plan Note (Signed)
Tramadol prn  Potential benefits of a long term Tramadol/opioids use as well as potential risks (i.e. addiction risk, apnea etc) and complications (i.e. Somnolence, constipation and others) were explained to the patient and were aknowledged. Lions mane

## 2020-09-19 NOTE — Patient Instructions (Signed)
You can try Lion's Mane Mushroom capsules for memory, focus, neuropathy ( [email or web address])    

## 2020-10-12 NOTE — Telephone Encounter (Signed)
PROLIA GIVEN 05/15/2020 NEXT INJECTION 11/13/2020

## 2020-10-29 ENCOUNTER — Other Ambulatory Visit: Payer: Self-pay | Admitting: Endocrinology

## 2020-10-29 ENCOUNTER — Other Ambulatory Visit: Payer: Self-pay | Admitting: Internal Medicine

## 2020-11-13 ENCOUNTER — Telehealth: Payer: Self-pay | Admitting: *Deleted

## 2020-11-13 NOTE — Telephone Encounter (Signed)
Deductible n/a  OOP MAX $5150 ($47met)  Annual exam 05/15/2020  Calcium 9.6            Date 09/13/2020  Upcoming dental procedures   Prior Authorization needed NO  Pt estimated Cost $75  APPT 11/14/2020     Coverage Details: $75 one dose, 0% admin fee

## 2020-11-14 ENCOUNTER — Other Ambulatory Visit: Payer: Self-pay

## 2020-11-14 ENCOUNTER — Ambulatory Visit (INDEPENDENT_AMBULATORY_CARE_PROVIDER_SITE_OTHER): Payer: PPO | Admitting: Anesthesiology

## 2020-11-14 DIAGNOSIS — M810 Age-related osteoporosis without current pathological fracture: Secondary | ICD-10-CM | POA: Diagnosis not present

## 2020-11-14 MED ORDER — DENOSUMAB 60 MG/ML ~~LOC~~ SOSY
60.0000 mg | PREFILLED_SYRINGE | Freq: Once | SUBCUTANEOUS | Status: AC
  Administered 2020-11-14: 60 mg via SUBCUTANEOUS

## 2020-11-20 ENCOUNTER — Encounter: Payer: Self-pay | Admitting: Family Medicine

## 2020-11-20 ENCOUNTER — Ambulatory Visit: Payer: PPO | Admitting: Family Medicine

## 2020-11-20 DIAGNOSIS — G43709 Chronic migraine without aura, not intractable, without status migrainosus: Secondary | ICD-10-CM

## 2020-11-20 NOTE — Progress Notes (Addendum)
11/20/2020 AA: She continues to do well. She feels Botox works well but does note more headaches a couple of weeks prior to next procedure. Sumatriptan usually aborts migraine. She takes 2-3 per month. Tremor is better. She continues to work closely with psychiatry.   07/05/2020 ALL: She is doing well, today. She reports headaches are very well managed. She is working with psychiatry to reduce Rexulti dose due to concerns of parkinsonism. MRI was unremarkable. She will consider DaT if needed.   Consent Form Botulism Toxin Injection For Chronic Migraine    Reviewed orally with patient, additionally signature is on file:  Botulism toxin has been approved by the Federal drug administration for treatment of chronic migraine. Botulism toxin does not cure chronic migraine and it may not be effective in some patients.  The administration of botulism toxin is accomplished by injecting a small amount of toxin into the muscles of the neck and head. Dosage must be titrated for each individual. Any benefits resulting from botulism toxin tend to wear off after 3 months with a repeat injection required if benefit is to be maintained. Injections are usually done every 3-4 months with maximum effect peak achieved by about 2 or 3 weeks. Botulism toxin is expensive and you should be sure of what costs you will incur resulting from the injection.  The side effects of botulism toxin use for chronic migraine may include:   -Transient, and usually mild, facial weakness with facial injections  -Transient, and usually mild, head or neck weakness with head/neck injections  -Reduction or loss of forehead facial animation due to forehead muscle weakness  -Eyelid drooping  -Dry eye  -Pain at the site of injection or bruising at the site of injection  -Double vision  -Potential unknown long term risks   Contraindications: You should not have Botox if you are pregnant, nursing, allergic to albumin, have an  infection, skin condition, or muscle weakness at the site of the injection, or have myasthenia gravis, Lambert-Eaton syndrome, or ALS.  It is also possible that as with any injection, there may be an allergic reaction or no effect from the medication. Reduced effectiveness after repeated injections is sometimes seen and rarely infection at the injection site may occur. All care will be taken to prevent these side effects. If therapy is given over a long time, atrophy and wasting in the muscle injected may occur. Occasionally the patient's become refractory to treatment because they develop antibodies to the toxin. In this event, therapy needs to be modified.  I have read the above information and consent to the administration of botulism toxin.    BOTOX PROCEDURE NOTE FOR MIGRAINE HEADACHE  Contraindications and precautions discussed with patient(above). Aseptic procedure was observed and patient tolerated procedure. Procedure performed by Debbora Presto, FNP-C.   The condition has existed for more than 6 months, and pt does not have a diagnosis of ALS, Myasthenia Gravis or Lambert-Eaton Syndrome.  Risks and benefits of injections discussed and pt agrees to proceed with the procedure.  Written consent obtained  These injections are medically necessary. Pt  receives good benefits from these injections. These injections do not cause sedations or hallucinations which the oral therapies may cause.   Description of procedure:  The patient was placed in a sitting position. The standard protocol was used for Botox as follows, with 5 units of Botox injected at each site:  -Procerus muscle, midline injection  -Corrugator muscle, bilateral injection  -Frontalis muscle, bilateral injection, with 2  sites each side, medial injection was performed in the upper one third of the frontalis muscle, in the region vertical from the medial inferior edge of the superior orbital rim. The lateral injection was again in  the upper one third of the forehead vertically above the lateral limbus of the cornea, 1.5 cm lateral to the medial injection site.  -Temporalis muscle injection, 4 sites, bilaterally. The first injection was 3 cm above the tragus of the ear, second injection site was 1.5 cm to 3 cm up from the first injection site in line with the tragus of the ear. The third injection site was 1.5-3 cm forward between the first 2 injection sites. The fourth injection site was 1.5 cm posterior to the second injection site. 5th site laterally in the temporalis  muscleat the level of the outer canthus.  -Occipitalis muscle injection, 3 sites, bilaterally. The first injection was done one half way between the occipital protuberance and the tip of the mastoid process behind the ear. The second injection site was done lateral and superior to the first, 1 fingerbreadth from the first injection. The third injection site was 1 fingerbreadth superiorly and medially from the first injection site.  -Cervical paraspinal muscle injection, 2 sites, bilaterally. The first injection site was 1 cm from the midline of the cervical spine, 3 cm inferior to the lower border of the occipital protuberance. The second injection site was 1.5 cm superiorly and laterally to the first injection site.  -Trapezius muscle injection was performed at 3 sites, bilaterally. The first injection site was in the upper trapezius muscle halfway between the inflection point of the neck, and the acromion. The second injection site was one half way between the acromion and the first injection site. The third injection was done between the first injection site and the inflection point of the neck.   Will return for repeat injection in 3 months.   A total of 200 units of Botox was prepared, 155 units of Botox was injected as documented above, any Botox not injected was wasted. The patient tolerated the procedure well, there were no complications of the above  procedure.  Agree with procedure   Sarina Ill, MD [facility]

## 2020-11-20 NOTE — Progress Notes (Signed)
Botox- 100 units x 2 vial Lot: [id number] Expiration: 12/2022 NDC: 1886-7737-36  Bacteriostatic 0.9% Sodium Chloride- 80mL total [id number] Expiration: 2/23 NDC: 15183-437-35  Dx: D89.784 BB

## 2020-11-22 ENCOUNTER — Ambulatory Visit: Payer: PPO | Admitting: Adult Health

## 2020-12-04 ENCOUNTER — Other Ambulatory Visit: Payer: Self-pay

## 2020-12-04 ENCOUNTER — Ambulatory Visit: Payer: PPO | Admitting: Adult Health

## 2020-12-04 ENCOUNTER — Encounter: Payer: Self-pay | Admitting: Adult Health

## 2020-12-04 VITALS — BP 137/81 | HR 94 | Ht 62.0 in | Wt 145.0 lb

## 2020-12-04 DIAGNOSIS — Z9989 Dependence on other enabling machines and devices: Secondary | ICD-10-CM | POA: Diagnosis not present

## 2020-12-04 DIAGNOSIS — G4733 Obstructive sleep apnea (adult) (pediatric): Secondary | ICD-10-CM | POA: Diagnosis not present

## 2020-12-04 NOTE — Patient Instructions (Signed)
Continue using CPAP nightly and greater than 4 hours each night Pressure increased to 9cmH20 If your symptoms worsen or you develop new symptoms please let us know.
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
## 2020-12-31 ENCOUNTER — Telehealth: Payer: Self-pay | Admitting: Neurology

## 2020-12-31 NOTE — Telephone Encounter (Signed)
Bethany: I received a note from Dr. Toy Care who recommends we go ahead with dopamine active transport scan (DAT scan).  After medical management of her medications, patient still has right hand tremor.  We need to go forward with a DAT scan to see if her tremor is from Parkinson's disease or other parkinsonian disorder versus history of medical management with dopamine agonists or possibly even essential tremor.  Please call patient and if she agrees I can go ahead and reorder the [place].  Thanks.

## 2021-01-01 NOTE — Telephone Encounter (Signed)
I spoke with the patient's husband, Patrick Jupiter (on [place]) and discussed the message from Dr Jaynee Eagles regarding DAT scan.  The husband stated the patient was currently taking a nap and they were out of town for a week.  He will talk to her and then will call us back when they are back in town.  He verbalized appreciation for the call.

## 2021-01-03 ENCOUNTER — Other Ambulatory Visit: Payer: PPO

## 2021-01-08 NOTE — Telephone Encounter (Signed)
I returned the pt's husband's call and he gave the phone to the pt. We discussed the message from Dr Jaynee Eagles. Pt is agreeable to proceed with the DAT scan. They live in [place]. Discussed options for patient to sign consent form for DAT scan. We will email her the form to [email or web address]. She will mail the form back to our office.

## 2021-01-08 NOTE — Telephone Encounter (Signed)
Pt's wife returned call. Please call back when available.  °

## 2021-01-09 ENCOUNTER — Other Ambulatory Visit: Payer: Self-pay | Admitting: Neurology

## 2021-01-09 ENCOUNTER — Other Ambulatory Visit: Payer: Self-pay | Admitting: Physician Assistant

## 2021-01-09 ENCOUNTER — Other Ambulatory Visit: Payer: Self-pay | Admitting: Endocrinology

## 2021-01-09 DIAGNOSIS — R251 Tremor, unspecified: Secondary | ICD-10-CM

## 2021-01-09 DIAGNOSIS — G20 Parkinson's disease: Secondary | ICD-10-CM

## 2021-01-09 NOTE — Telephone Encounter (Signed)
Hilda Blades is emailing it to her to sign.

## 2021-01-09 NOTE — Telephone Encounter (Signed)
Done, does she need another DAT scan form?

## 2021-01-10 ENCOUNTER — Ambulatory Visit: Payer: PPO | Admitting: Endocrinology

## 2021-01-16 ENCOUNTER — Other Ambulatory Visit (INDEPENDENT_AMBULATORY_CARE_PROVIDER_SITE_OTHER): Payer: PPO

## 2021-01-16 ENCOUNTER — Other Ambulatory Visit: Payer: Self-pay

## 2021-01-16 DIAGNOSIS — E118 Type 2 diabetes mellitus with unspecified complications: Secondary | ICD-10-CM

## 2021-01-16 LAB — BASIC METABOLIC PANEL
BUN: 19 mg/dL (ref 6–23)
CO2: 30 mEq/L (ref 19–32)
Calcium: 9.7 mg/dL (ref 8.4–10.5)
Chloride: 102 mEq/L (ref 96–112)
Creatinine, Ser: 1.21 mg/dL — ABNORMAL HIGH (ref 0.40–1.20)
GFR: 42.66 mL/min — ABNORMAL LOW (ref >60.00)
Glucose, Bld: 115 mg/dL — ABNORMAL HIGH (ref 70–99)
Potassium: 4.4 mEq/L (ref 3.5–5.1)
Sodium: 138 mEq/L (ref 135–145)

## 2021-01-16 LAB — HEMOGLOBIN A1C: Hgb A1c MFr Bld: 6.9 % — ABNORMAL HIGH (ref 4.6–6.5)

## 2021-01-17 ENCOUNTER — Ambulatory Visit: Payer: PPO | Admitting: Internal Medicine

## 2021-01-17 NOTE — Telephone Encounter (Signed)
Received signed patient consent form. Filled out DaTscan referral & physician intake form & gave to MD to sign.

## 2021-01-17 NOTE — Telephone Encounter (Signed)
Faxed all paperwork for DaTscan to [facility]. Fax: [phone]. They will complete PA and let me know the outcome. Pending.

## 2021-01-18 ENCOUNTER — Encounter: Payer: Self-pay | Admitting: Endocrinology

## 2021-01-18 ENCOUNTER — Other Ambulatory Visit: Payer: Self-pay

## 2021-01-18 ENCOUNTER — Ambulatory Visit: Payer: PPO | Admitting: Endocrinology

## 2021-01-18 VITALS — BP 124/78 | HR 89 | Ht 62.5 in | Wt 143.6 lb

## 2021-01-18 DIAGNOSIS — E1165 Type 2 diabetes mellitus with hyperglycemia: Secondary | ICD-10-CM

## 2021-01-18 DIAGNOSIS — N289 Disorder of kidney and ureter, unspecified: Secondary | ICD-10-CM
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
## 2021-01-18 NOTE — Patient Instructions (Addendum)
Check blood sugars : about 2 hours after meals and do this after different meals by rotation  Recommended blood sugar levels on waking up are 90-130 and about 2 hours after meal is 130-180  Please bring your blood sugar monitor to each visit, thank you  Metformin 1 pill 2x daily  Pandin 1 at lunch ALSO

## 2021-01-29 ENCOUNTER — Telehealth: Payer: Self-pay | Admitting: Internal Medicine

## 2021-01-29 MED ORDER — VALACYCLOVIR HCL 500 MG PO TABS: 500.0000 mg | ORAL_TABLET | Freq: Three times a day (TID) | ORAL | 1 refills | Status: DC

## 2021-01-29 MED ORDER — TRIAMCINOLONE ACETONIDE 0.1 % MT PSTE: PASTE | OROMUCOSAL | 1 refills | Status: DC

## 2021-01-29 NOTE — Telephone Encounter (Signed)
Pt seen w/husband C/o tongue sores  L>R tongue ulcers ?H simplex Rx - Valtrex and Triamc

## 2021-02-07 ENCOUNTER — Ambulatory Visit (INDEPENDENT_AMBULATORY_CARE_PROVIDER_SITE_OTHER): Payer: PPO | Admitting: Internal Medicine

## 2021-02-07 ENCOUNTER — Encounter: Payer: Self-pay | Admitting: Internal Medicine

## 2021-02-07 ENCOUNTER — Other Ambulatory Visit: Payer: Self-pay

## 2021-02-07 ENCOUNTER — Ambulatory Visit: Payer: PPO | Admitting: Internal Medicine

## 2021-02-07 DIAGNOSIS — Z6830 Body mass index (BMI) 30.0-30.9, adult: Secondary | ICD-10-CM | POA: Diagnosis not present

## 2021-02-07 DIAGNOSIS — K120 Recurrent oral aphthae: Secondary | ICD-10-CM | POA: Diagnosis not present

## 2021-02-07 DIAGNOSIS — E6609 Other obesity due to excess calories: Secondary | ICD-10-CM

## 2021-02-07 DIAGNOSIS — R5383 Other fatigue: Secondary | ICD-10-CM | POA: Diagnosis not present

## 2021-02-07 MED ORDER — TRIAMCINOLONE ACETONIDE 0.1 % MT PSTE
PASTE | OROMUCOSAL | 1 refills | Status: DC
Start: 2021-02-07 — End: 2021-05-10

## 2021-02-07 NOTE — Progress Notes (Signed)
Subjective:  Patient ID: [name], female    DOB: 04-Jul-1941  Age: 80 y.o. MRN: [id number]  CC: Follow-up (4 MONTH F/U)   HPI ELANA JIAN presents for mouth ulcers - better  C/o ST C/o not feeing well  Pt lost wt due to mouth ulcers   Outpatient Medications Prior to Visit  Medication Sig Dispense Refill   aspirin EC 81 MG tablet Take 1 tablet (81 mg total) by mouth daily. 100 tablet 3   b complex vitamins tablet Take 1 tablet by mouth daily. Vitamin supplement     BD PEN NEEDLE NANO U/F 32G X 4 MM MISC USE AS DIRECTED TWICE DAILY. 200 each 3   Blood Glucose Monitoring Suppl ([place]) w/Device KIT Use as instructed to check blood sugar 5 times daily. 1 kit 0   busPIRone (BUSPAR) 15 MG tablet Take 1 tablet by mouth 2 (two) times daily.     calcium-vitamin D (OSCAL WITH D) 500-200 MG-UNIT per tablet Take 1 tablet by mouth daily with breakfast.     cetirizine (ZYRTEC) 10 MG tablet Take 10 mg by mouth daily as needed for allergies.     cholecalciferol (VITAMIN D) 1000 units tablet Take 2,000 Units by mouth daily.     dicyclomine (BENTYL) 10 MG capsule Take 1 capsule (10 mg total) by mouth 2 (two) times daily as needed for spasms. 180 capsule 4   estradiol (CLIMARA - DOSED IN MG/24 HR) 0.025 mg/24hr patch Place 1 patch (0.025 mg total) onto the skin once a week. APPLY 1 PATCH ONTO SKIN ONCE WEEKLY 12 patch 4   famotidine (PEPCID) 20 MG tablet TAKE 1 TABLET BY MOUTH EVERYDAY AT BEDTIME 90 tablet 4   L-Methylfolate (DEPLIN) 15 MG TABS Take 15 mg by mouth daily. For treatment of depression     Lancet Devices (ONE TOUCH DELICA LANCING DEV) MISC Use daily as directed 1 each 0   Liniments (SALONPAS PAIN RELIEF PATCH EX) Apply 1 patch topically at bedtime.     LORazepam (ATIVAN) 1 MG tablet Take 1 mg by mouth 4 (four) times daily as needed for anxiety.   2   lovastatin (MEVACOR) 20 MG tablet TAKE 1 TABLET (20 MG TOTAL) BY MOUTH AT BEDTIME. NEED APPT W/LIPIDS CHECK  BEFORE FUTURE REFILLS 30 tablet 11   Magnesium 500 MG CAPS Take 1 capsule by mouth daily.     metFORMIN (GLUCOPHAGE) 500 MG tablet TAKE 1 TABLET BY MOUTH IN THE MORNING AND 2 TABLETS IN THE EVENING (Patient taking differently: Take 1/2 tablet by mouth in the morning and 1 tablets in the evening.) 270 tablet 0   Multiple Vitamins-Minerals (MULTIVITAMIN,TX-MINERALS) tablet Take 1 tablet by mouth daily.     ondansetron (ZOFRAN) 4 MG tablet TAKE 1 TABLET (4 MG TOTAL) BY MOUTH 2 (TWO) TIMES DAILY. TAKE 1 TABLET BY MOUTH EVERY MORNING AND EVENING AS NEEDED. NEED OFFICE VISIT FOR REFILLS 180 tablet 4   ONETOUCH DELICA LANCETS 81E MISC USE UP TO 4 TIMES A DAY 200 each 3   ONETOUCH VERIO test strip USE TO CHECK BLOOD SUGARS 5 TIMES DAILY. 450 each 3   pantoprazole (PROTONIX) 40 MG tablet TAKE 1 TABLET BY MOUTH TWICE A DAY 180 tablet 4   pioglitazone (ACTOS) 30 MG tablet TAKE 1 TABLET BY MOUTH EVERY DAY 90 tablet 1   repaglinide (PRANDIN) 1 MG tablet TAKE 2 TABLETS BY MOUTH BEFORE BREAKFAST AND 1 TABLET AT DINNER 270 tablet 1  SUMAtriptan (IMITREX) 100 MG tablet 100 mg as needed.     traMADol (ULTRAM) 50 MG tablet TAKE 1 TABLET (50 MG TOTAL) BY MOUTH EVERY 12 (TWELVE) HOURS AS NEEDED FOR SEVERE PAIN. 180 tablet 1   triamcinolone (KENALOG) 0.1 % paste Use on ulcers qid prn 5 g 1   TRINTELLIX 20 MG TABS Take 20 mg by mouth daily.  2   VICTOZA 18 MG/3ML SOPN INJECT 1.2MG INTO MUSCLE DAILY 18 mL 2   VOLTAREN 1 % GEL APPLY 2 GRAMS TO KNEE 2-3 TIMES DAILY PRN  1   brexpiprazole (REXULTI) 1 MG TABS tablet Take 1 mg by mouth. Every third day (Monday)     valACYclovir (VALTREX) 500 MG tablet Take 1 tablet (500 mg total) by mouth 3 (three) times daily. (Patient not taking: Reported on 02/07/2021) 21 tablet 1   No facility-administered medications prior to visit.    ROS: Review of Systems  Constitutional:  Positive for fatigue. Negative for activity change, appetite change, chills and unexpected weight change.   HENT:  Positive for sore throat. Negative for congestion, mouth sores, sinus pressure, trouble swallowing and voice change.   Eyes:  Negative for visual disturbance.  Respiratory:  Negative for cough and chest tightness.   Gastrointestinal:  Negative for abdominal pain and nausea.  Genitourinary:  Negative for difficulty urinating, frequency and vaginal pain.  Musculoskeletal:  Positive for arthralgias and back pain. Negative for gait problem.  Skin:  Negative for pallor and rash.  Neurological:  Negative for dizziness, tremors, weakness, numbness and headaches.  Psychiatric/Behavioral:  Positive for decreased concentration, dysphoric mood and sleep disturbance. Negative for confusion and suicidal ideas. The patient is nervous/anxious.    Objective:  BP 130/82 (BP Location: Left Arm)   Pulse (!) 112   Temp 98.5 F (36.9 C) (Oral)   Ht 5' 2.25" (1.581 m)   Wt 135 lb (61.2 kg)   SpO2 96%   BMI 24.49 kg/m   BP Readings from Last 3 Encounters:  02/07/21 130/82  01/18/21 124/78  12/04/20 137/81    Wt Readings from Last 3 Encounters:  02/07/21 135 lb (61.2 kg)  01/18/21 143 lb 9.6 oz (65.1 kg)  12/04/20 145 lb (65.8 kg)    Physical Exam Constitutional:      General: She is not in acute distress.    Appearance: She is well-developed.  HENT:     Head: Normocephalic.     Right Ear: External ear normal.     Left Ear: External ear normal.     Nose: Nose normal.  Eyes:     General:        Right eye: No discharge.        Left eye: No discharge.     Conjunctiva/sclera: Conjunctivae normal.     Pupils: Pupils are equal, round, and reactive to light.  Neck:     Thyroid: No thyromegaly.     Vascular: No JVD.     Trachea: No tracheal deviation.  Cardiovascular:     Rate and Rhythm: Normal rate and regular rhythm.     Heart sounds: Normal heart sounds.  Pulmonary:     Effort: No respiratory distress.     Breath sounds: No stridor. No wheezing.  Abdominal:     General: Bowel  sounds are normal. There is no distension.     Palpations: Abdomen is soft. There is no mass.     Tenderness: There is no abdominal tenderness. There is no guarding or rebound.  Musculoskeletal:        General: Tenderness present.     Cervical back: Normal range of motion and neck supple. No rigidity.  Lymphadenopathy:     Cervical: No cervical adenopathy.  Skin:    Findings: No erythema or rash.  Neurological:     Cranial Nerves: No cranial nerve deficit.     Motor: No abnormal muscle tone.     Coordination: Coordination normal.     Deep Tendon Reflexes: Reflexes normal.  Psychiatric:        Behavior: Behavior normal.        Thought Content: Thought content normal.        Judgment: Judgment normal.  Healing tongue and mouth ulcers.  Throat is clear,tonsillar adenopathy is present Lumbar spine is tender to palpation  Lab Results  Component Value Date   WBC 7.1 03/13/2018   HGB 10.8 (L) 03/13/2018   HCT 33.2 (L) 03/13/2018   PLT 412 03/13/2018   GLUCOSE 115 (H) 01/16/2021   CHOL 116 05/10/2020   TRIG 53.0 05/10/2020   HDL 71.30 05/10/2020   LDLCALC 34 05/10/2020   ALT 15 05/10/2020   AST 24 05/10/2020   NA 138 01/16/2021   K 4.4 01/16/2021   CL 102 01/16/2021   CREATININE 1.21 (H) 01/16/2021   BUN 19 01/16/2021   CO2 30 01/16/2021   TSH 1.410 05/17/2020   INR 0.93 11/02/2013   HGBA1C 6.9 (H) 01/16/2021   MICROALBUR 1.4 09/18/2020    MR BRAIN W WO CONTRAST  Result Date: 06/09/2020  [facility] [address], [place], [place] [address] [phone] NEUROIMAGING REPORT STUDY DATE: 06/08/2020 PATIENT NAME: ALIZANDRA LOH DOB: 12/15/1940 MRN: [id number] EXAM: MRI Brain with and without contrast ORDERING CLINICIAN: Sarina Ill, MD CLINICAL HISTORY: 80 year old woman with tremor and headaches COMPARISON FILMS: CT 05/20/2016 TECHNIQUE:MRI of the brain with and without contrast was obtained utilizing 5 mm axial slices with T1, T2, T2 flair, SWI and  diffusion weighted views.  T1 sagittal, T2 coronal and postcontrast views in the axial and coronal plane were obtained. CONTRAST: 13 ml [employer] IMAGING SITE: [facility], [place]. FINDINGS: On sagittal images, the spinal cord is imaged caudally to C3-C4 and is normal in caliber.   The contents of the posterior fossa are of normal size and position.   The pituitary gland and optic chiasm appear normal.    There is moderate generalized cortical atrophy and mild corpus callosum atrophy.  The atrophy has progressed compared to the 2017 CT scan there are no abnormal extra-axial collections of fluid.  In the hemispheres, there are many T2/FLAIR hyperintense foci in the deep, periventricular and subcortical white matter.  None of these appear to be acute.  The cerebellum and brainstem appears normal.   The deep gray matter appears normal.     Diffusion weighted images are normal.  Susceptibility weighted images are normal.   There have been bilateral lens replacements.  Otherwise, the orbits appear normal.   The VIIth/VIIIth nerve complex appears normal.  The mastoid air cells appear normal.  The paranasal sinuses appear normal.  Flow voids are identified within the major intracerebral arteries.  After the infusion of contrast material, a normal enhancement pattern is noted.  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   Moderate generalized cortical atrophy, progressed compared to the 2017 CT scan 2.   T2/FLAIR  hyperintense foci throughout the hemispheres consistent with moderate chronic microvascular ischemic change. 3.   There were no acute findings and there was a normal enhancement pattern. INTERPRETING PHYSICIAN: Richard A. Felecia Shelling, MD, PhD, FAAN Certified in  Neuroimaging by [employer]    Assessment & Plan:    Walker Kehr, MD

## 2021-02-07 NOTE — Assessment & Plan Note (Signed)
Treated w/Valtrex Given Rx for Triamc paste

## 2021-02-07 NOTE — Assessment & Plan Note (Signed)
Pt lost wt due to mouth ulcers

## 2021-02-07 NOTE — Assessment & Plan Note (Signed)
Worse after mouth ulcers and stress Recent BMET was ok Rest more

## 2021-02-12 NOTE — Telephone Encounter (Signed)
[facility] is ready to be scheduled. No auth required per [employer]. Sent to [employer] @ [facility] to schedule.

## 2021-02-13 ENCOUNTER — Telehealth: Payer: Self-pay | Admitting: Adult Health

## 2021-02-13 NOTE — Telephone Encounter (Signed)
Pt said she received a call re: a test that was ordered by Dr Jaynee Eagles.  Pt states she no longer needs the test because her hand no longer shakes and does not need the test.

## 2021-02-14 ENCOUNTER — Ambulatory Visit (INDEPENDENT_AMBULATORY_CARE_PROVIDER_SITE_OTHER): Payer: PPO

## 2021-02-14 DIAGNOSIS — Z0000 Encounter for general adult medical examination without abnormal findings: Secondary | ICD-10-CM
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
## 2021-02-14 NOTE — Patient Instructions (Addendum)
[name] , Thank you for taking time to come for your Medicare Wellness Visit. I appreciate your ongoing commitment to your health goals. Please review the following plan we discussed and let me know if I can assist you in the future.   Screening recommendations/referrals: Colonoscopy: last done 12/05/2015; no repeat due to age 80: 06/07/2020 Bone Density: 06/07/2020; due every 2 years Recommended yearly ophthalmology/optometry visit for glaucoma screening and checkup Recommended yearly dental visit for hygiene and checkup  Vaccinations: Influenza vaccine: 05/23/2020 Pneumococcal vaccine: 08/04/2013, 06/24/2017 Tdap vaccine: 07/01/2012; due every 10 years Shingles vaccine: 07/20/2018, 10/21/2018   Covid-19: 09/15/2019, 10/06/2019, 06/12/2020, 12/14/2020  Advanced directives: Please bring a copy of your health care power of attorney and living will to the office at your convenience.  Conditions/risks identified: My goal is to increase my physical activity, because I'm not exercising like a should.  Next appointment: 02/15/2022 at 12:30 pm. Phone call   Preventive Care 80 Years and Older, Female Preventive care refers to lifestyle choices and visits with your health care provider that can promote health and wellness. What does preventive care include? A yearly physical exam. This is also called an annual well check. Dental exams once or twice a year. Routine eye exams. Ask your health care provider how often you should have your eyes checked. Personal lifestyle choices, including: Daily care of your teeth and gums. Regular physical activity. Eating a healthy diet. Avoiding tobacco and drug use. Limiting alcohol use. Practicing safe sex. Taking low-dose aspirin every day. Taking vitamin and mineral supplements as recommended by your health care provider. What happens during an annual well check? The services and screenings done by your health care provider during your annual well  check will depend on your age, overall health, lifestyle risk factors, and family history of disease. Counseling  Your health care provider may ask you questions about your: Alcohol use. Tobacco use. Drug use. Emotional well-being. Home and relationship well-being. Sexual activity. Eating habits. History of falls. Memory and ability to understand (cognition). Work and work Statistician. Reproductive health. Screening  You may have the following tests or measurements: Height, weight, and BMI. Blood pressure. Lipid and cholesterol levels. These may be checked every 5 years, or more frequently if you are over 46 years old. Skin check. Lung cancer screening. You may have this screening every year starting at age 49 if you have a 30-pack-year history of smoking and currently smoke or have quit within the past 15 years. Fecal occult blood test (FOBT) of the stool. You may have this test every year starting at age 62. Flexible sigmoidoscopy or colonoscopy. You may have a sigmoidoscopy every 5 years or a colonoscopy every 10 years starting at age 42. Hepatitis C blood test. Hepatitis B blood test. Sexually transmitted disease (STD) testing. Diabetes screening. This is done by checking your blood sugar (glucose) after you have not eaten for a while (fasting). You may have this done every 1-3 years. Bone density scan. This is done to screen for osteoporosis. You may have this done starting at age 50. Mammogram. This may be done every 1-2 years. Talk to your health care provider about how often you should have regular mammograms. Talk with your health care provider about your test results, treatment options, and if necessary, the need for more tests. Vaccines  Your health care provider may recommend certain vaccines, such as: Influenza vaccine. This is recommended every year. Tetanus, diphtheria, and acellular pertussis (Tdap, Td) vaccine. You may need a  Td booster every 10 years. Zoster  vaccine. You may need this after age 59. Pneumococcal 13-valent conjugate (PCV13) vaccine. One dose is recommended after age 56. Pneumococcal polysaccharide (PPSV23) vaccine. One dose is recommended after age 49. Talk to your health care provider about which screenings and vaccines you need and how often you need them. This information is not intended to replace advice given to you by your health care provider. Make sure you discuss any questions you have with your health care provider. Document Released: 09/08/2015 Document Revised: 05/01/2016 Document Reviewed: 06/13/2015 Elsevier Interactive Patient Education  2017 [employer] Prevention in the Home Falls can cause injuries. They can happen to people of all ages. There are many things you can do to make your home safe and to help prevent falls. What can I do on the outside of my home? Regularly fix the edges of walkways and driveways and fix any cracks. Remove anything that might make you trip as you walk through a door, such as a raised step or threshold. Trim any bushes or trees on the path to your home. Use bright outdoor lighting. Clear any walking paths of anything that might make someone trip, such as rocks or tools. Regularly check to see if handrails are loose or broken. Make sure that both sides of any steps have handrails. Any raised decks and porches should have guardrails on the edges. Have any leaves, snow, or ice cleared regularly. Use sand or salt on walking paths during winter. Clean up any spills in your garage right away. This includes oil or grease spills. What can I do in the bathroom? Use night lights. Install grab bars by the toilet and in the tub and shower. Do not use towel bars as grab bars. Use non-skid mats or decals in the tub or shower. If you need to sit down in the shower, use a plastic, non-slip stool. Keep the floor dry. Clean up any water that spills on the floor as soon as it happens. Remove  soap buildup in the tub or shower regularly. Attach bath mats securely with double-sided non-slip rug tape. Do not have throw rugs and other things on the floor that can make you trip. What can I do in the bedroom? Use night lights. Make sure that you have a light by your bed that is easy to reach. Do not use any sheets or blankets that are too big for your bed. They should not hang down onto the floor. Have a firm chair that has side arms. You can use this for support while you get dressed. Do not have throw rugs and other things on the floor that can make you trip. What can I do in the kitchen? Clean up any spills right away. Avoid walking on wet floors. Keep items that you use a lot in easy-to-reach places. If you need to reach something above you, use a strong step stool that has a grab bar. Keep electrical cords out of the way. Do not use floor polish or wax that makes floors slippery. If you must use wax, use non-skid floor wax. Do not have throw rugs and other things on the floor that can make you trip. What can I do with my stairs? Do not leave any items on the stairs. Make sure that there are handrails on both sides of the stairs and use them. Fix handrails that are broken or loose. Make sure that handrails are as long as the stairways. Check any  carpeting to make sure that it is firmly attached to the stairs. Fix any carpet that is loose or worn. Avoid having throw rugs at the top or bottom of the stairs. If you do have throw rugs, attach them to the floor with carpet tape. Make sure that you have a light switch at the top of the stairs and the bottom of the stairs. If you do not have them, ask someone to add them for you. What else can I do to help prevent falls? Wear shoes that: Do not have high heels. Have rubber bottoms. Are comfortable and fit you well. Are closed at the toe. Do not wear sandals. If you use a stepladder: Make sure that it is fully opened. Do not climb a  closed stepladder. Make sure that both sides of the stepladder are locked into place. Ask someone to hold it for you, if possible. Clearly mark and make sure that you can see: Any grab bars or handrails. First and last steps. Where the edge of each step is. Use tools that help you move around (mobility aids) if they are needed. These include: Canes. Walkers. Scooters. Crutches. Turn on the lights when you go into a dark area. Replace any light bulbs as soon as they burn out. Set up your furniture so you have a clear path. Avoid moving your furniture around. If any of your floors are uneven, fix them. If there are any pets around you, be aware of where they are. Review your medicines with your doctor. Some medicines can make you feel dizzy. This can increase your chance of falling. Ask your doctor what other things that you can do to help prevent falls. This information is not intended to replace advice given to you by your health care provider. Make sure you discuss any questions you have with your health care provider. Document Released: 06/08/2009 Document Revised: 01/18/2016 Document Reviewed: 09/16/2014 Elsevier Interactive Patient Education  2017 [employer].

## 2021-02-20 ENCOUNTER — Ambulatory Visit: Payer: PPO | Admitting: Family Medicine

## 2021-02-25 ENCOUNTER — Other Ambulatory Visit: Payer: Self-pay | Admitting: Endocrinology

## 2021-03-01 ENCOUNTER — Other Ambulatory Visit: Payer: Self-pay

## 2021-03-01 ENCOUNTER — Ambulatory Visit: Payer: PPO | Admitting: Neurology

## 2021-03-01 DIAGNOSIS — G43709 Chronic migraine without aura, not intractable, without status migrainosus: Secondary | ICD-10-CM | POA: Diagnosis not present

## 2021-03-01 NOTE — Progress Notes (Signed)
Botox- 200 units x 1 vial Lot: [id number] Expiration: 08/2023 NDC: 2197-5883-25   Bacteriostatic 0.9% Sodium Chloride- 13mL total [id number] Expiration: 12/23 NDC: 09407-680-88   B/B

## 2021-03-01 NOTE — Progress Notes (Signed)
03/01/2021: just one headache in last 3 months, will increase to 13 weeks and then 14 weeks etc and maybe we can even stop botox. She does not clench. +orb oculi  11/20/2020 AA: She continues to do well. She feels Botox works well but does note more headaches a couple of weeks prior to next procedure. Sumatriptan usually aborts migraine. She takes 2-3 per month. Tremor is better. She continues to work closely with psychiatry.   07/05/2020 ALL: She is doing well, today. She reports headaches are very well managed. She is working with psychiatry to reduce Rexulti dose due to concerns of parkinsonism. MRI was unremarkable. She will consider DaT if needed.   Consent Form Botulism Toxin Injection For Chronic Migraine    Reviewed orally with patient, additionally signature is on file:  Botulism toxin has been approved by the Federal drug administration for treatment of chronic migraine. Botulism toxin does not cure chronic migraine and it may not be effective in some patients.  The administration of botulism toxin is accomplished by injecting a small amount of toxin into the muscles of the neck and head. Dosage must be titrated for each individual. Any benefits resulting from botulism toxin tend to wear off after 3 months with a repeat injection required if benefit is to be maintained. Injections are usually done every 3-4 months with maximum effect peak achieved by about 2 or 3 weeks. Botulism toxin is expensive and you should be sure of what costs you will incur resulting from the injection.  The side effects of botulism toxin use for chronic migraine may include:   -Transient, and usually mild, facial weakness with facial injections  -Transient, and usually mild, head or neck weakness with head/neck injections  -Reduction or loss of forehead facial animation due to forehead muscle weakness  -Eyelid drooping  -Dry eye  -Pain at the site of injection or bruising at the site of injection  -Double  vision  -Potential unknown long term risks   Contraindications: You should not have Botox if you are pregnant, nursing, allergic to albumin, have an infection, skin condition, or muscle weakness at the site of the injection, or have myasthenia gravis, Lambert-Eaton syndrome, or ALS.  It is also possible that as with any injection, there may be an allergic reaction or no effect from the medication. Reduced effectiveness after repeated injections is sometimes seen and rarely infection at the injection site may occur. All care will be taken to prevent these side effects. If therapy is given over a long time, atrophy and wasting in the muscle injected may occur. Occasionally the patient's become refractory to treatment because they develop antibodies to the toxin. In this event, therapy needs to be modified.  I have read the above information and consent to the administration of botulism toxin.    BOTOX PROCEDURE NOTE FOR MIGRAINE HEADACHE  Contraindications and precautions discussed with patient(above). Aseptic procedure was observed and patient tolerated procedure. Procedure performed by Debbora Presto, FNP-C.   The condition has existed for more than 6 months, and pt does not have a diagnosis of ALS, Myasthenia Gravis or Lambert-Eaton Syndrome.  Risks and benefits of injections discussed and pt agrees to proceed with the procedure.  Written consent obtained  These injections are medically necessary. Pt  receives good benefits from these injections. These injections do not cause sedations or hallucinations which the oral therapies may cause.   Description of procedure:  The patient was placed in a sitting position. The standard protocol  was used for Botox as follows, with 5 units of Botox injected at each site:  -Procerus muscle, midline injection  -Corrugator muscle, bilateral injection  -Frontalis muscle, bilateral injection, with 2 sites each side, medial injection was performed in the upper  one third of the frontalis muscle, in the region vertical from the medial inferior edge of the superior orbital rim. The lateral injection was again in the upper one third of the forehead vertically above the lateral limbus of the cornea, 1.5 cm lateral to the medial injection site.  -Temporalis muscle injection, 4 sites, bilaterally. The first injection was 3 cm above the tragus of the ear, second injection site was 1.5 cm to 3 cm up from the first injection site in line with the tragus of the ear. The third injection site was 1.5-3 cm forward between the first 2 injection sites. The fourth injection site was 1.5 cm posterior to the second injection site. 5th site laterally in the temporalis  muscleat the level of the outer canthus.  -Occipitalis muscle injection, 3 sites, bilaterally. The first injection was done one half way between the occipital protuberance and the tip of the mastoid process behind the ear. The second injection site was done lateral and superior to the first, 1 fingerbreadth from the first injection. The third injection site was 1 fingerbreadth superiorly and medially from the first injection site.  -Cervical paraspinal muscle injection, 2 sites, bilaterally. The first injection site was 1 cm from the midline of the cervical spine, 3 cm inferior to the lower border of the occipital protuberance. The second injection site was 1.5 cm superiorly and laterally to the first injection site.  -Trapezius muscle injection was performed at 3 sites, bilaterally. The first injection site was in the upper trapezius muscle halfway between the inflection point of the neck, and the acromion. The second injection site was one half way between the acromion and the first injection site. The third injection was done between the first injection site and the inflection point of the neck.   Will return for repeat injection in 3 months.   A total of 200 units of Botox was prepared, 155 units of Botox was  injected as documented above, any Botox not injected was wasted. The patient tolerated the procedure well, there were no complications of the above procedure.  Agree with procedure   Sarina Ill, MD [facility]

## 2021-03-02 DIAGNOSIS — G4733 Obstructive sleep apnea (adult) (pediatric): Secondary | ICD-10-CM | POA: Diagnosis not present

## 2021-03-26 ENCOUNTER — Other Ambulatory Visit: Payer: Self-pay | Admitting: Internal Medicine

## 2021-04-12 DIAGNOSIS — H524 Presbyopia: Secondary | ICD-10-CM | POA: Diagnosis not present

## 2021-04-12 DIAGNOSIS — H35033 Hypertensive retinopathy, bilateral: Secondary | ICD-10-CM | POA: Diagnosis not present

## 2021-04-12 DIAGNOSIS — E119 Type 2 diabetes mellitus without complications: Secondary | ICD-10-CM | POA: Diagnosis not present

## 2021-04-12 DIAGNOSIS — H35372 Puckering of macula, left eye: Secondary | ICD-10-CM | POA: Diagnosis not present

## 2021-04-12 DIAGNOSIS — H35342 Macular cyst, hole, or pseudohole, left eye: Secondary | ICD-10-CM | POA: Diagnosis not present

## 2021-04-12 DIAGNOSIS — H04123 Dry eye syndrome of bilateral lacrimal glands: Secondary | ICD-10-CM | POA: Diagnosis not present

## 2021-04-12 DIAGNOSIS — Z961 Presence of intraocular lens: Secondary | ICD-10-CM | POA: Diagnosis not present

## 2021-04-12 LAB — HM DIABETES EYE EXAM

## 2021-04-17 ENCOUNTER — Telehealth: Payer: Self-pay | Admitting: *Deleted

## 2021-04-17 NOTE — Telephone Encounter (Signed)
Prolia insurance verification has been sent awaiting Summary of benefits  

## 2021-04-17 NOTE — Telephone Encounter (Signed)
PROLIA GIVEN 11/14/2020 NEXT INJECTION 05/18/2021

## 2021-04-21 ENCOUNTER — Other Ambulatory Visit: Payer: Self-pay | Admitting: Internal Medicine

## 2021-04-23 ENCOUNTER — Telehealth: Payer: Self-pay | Admitting: Lab

## 2021-04-23 NOTE — Progress Notes (Signed)
  Chronic Care Management   Outreach Note  04/23/2021 Name: GENERRA GRADWELL MRN: [id number] DOB: 18-May-1941  Referred by: Cassandria Anger, MD Reason for referral : Medication Management   An unsuccessful telephone outreach was attempted today. The patient was referred to the pharmacist for assistance with care management and care coordination.   Follow Up Plan:   [place]

## 2021-04-26 ENCOUNTER — Encounter: Payer: Self-pay | Admitting: Endocrinology

## 2021-05-02 ENCOUNTER — Other Ambulatory Visit: Payer: Self-pay | Admitting: Endocrinology

## 2021-05-03 ENCOUNTER — Other Ambulatory Visit: Payer: Self-pay | Admitting: Nurse Practitioner

## 2021-05-03 DIAGNOSIS — Z1231 Encounter for screening mammogram for malignant neoplasm of breast: Secondary | ICD-10-CM

## 2021-05-04 ENCOUNTER — Telehealth: Payer: Self-pay | Admitting: Lab

## 2021-05-04 NOTE — Chronic Care Management (AMB) (Signed)
  Chronic Care Management   Note  05/04/2021 Name: [name] MRN: [id number] DOB: 07-08-1941  [name] is a 80 y.o. year old female who is a primary care patient of Plotnikov, Evie Lacks, MD. I reached out to [name] by phone today in response to a referral sent by [name] PCP, Plotnikov, Evie Lacks, MD.   [name] was given information about Chronic Care Management services today including:  CCM service includes personalized support from designated clinical staff supervised by her physician, including individualized plan of care and coordination with other care providers 24/7 contact phone numbers for assistance for urgent and routine care needs. Service will only be billed when office clinical staff spend 20 minutes or more in a month to coordinate care. Only one practitioner may furnish and bill the service in a calendar month. The patient may stop CCM services at any time (effective at the end of the month) by phone call to the office staff.   Patient agreed to services and verbal consent obtained.   Follow up [place]

## 2021-05-06 ENCOUNTER — Other Ambulatory Visit: Payer: Self-pay | Admitting: Internal Medicine

## 2021-05-09 NOTE — Telephone Encounter (Addendum)
Deductible n/a  OOP MAX [id number] [phone] met)  Annual exam 05/15/2020  Calcium   9.7          Date 01/16/2021  Upcoming dental procedures no  Prior Authorization needed no  Pt estimated Cost $222  Appt 05/29/2021   Coverage Details: 20% one dose, 20% admin fee

## 2021-05-10 ENCOUNTER — Ambulatory Visit (INDEPENDENT_AMBULATORY_CARE_PROVIDER_SITE_OTHER): Payer: PPO | Admitting: Internal Medicine

## 2021-05-10 ENCOUNTER — Other Ambulatory Visit: Payer: Self-pay | Admitting: Internal Medicine

## 2021-05-10 ENCOUNTER — Encounter: Payer: Self-pay | Admitting: Internal Medicine

## 2021-05-10 ENCOUNTER — Other Ambulatory Visit: Payer: Self-pay

## 2021-05-10 VITALS — BP 138/82 | HR 89 | Temp 98.1°F | Ht 62.25 in | Wt 137.2 lb

## 2021-05-10 DIAGNOSIS — E1129 Type 2 diabetes mellitus with other diabetic kidney complication: Secondary | ICD-10-CM

## 2021-05-10 DIAGNOSIS — G8929 Other chronic pain: Secondary | ICD-10-CM | POA: Diagnosis not present

## 2021-05-10 DIAGNOSIS — E7800 Pure hypercholesterolemia, unspecified: Secondary | ICD-10-CM

## 2021-05-10 DIAGNOSIS — IMO0002 Reserved for concepts with insufficient information to code with codable children: Secondary | ICD-10-CM

## 2021-05-10 DIAGNOSIS — M5440 Lumbago with sciatica, unspecified side: Secondary | ICD-10-CM

## 2021-05-10 DIAGNOSIS — E1165 Type 2 diabetes mellitus with hyperglycemia: Secondary | ICD-10-CM | POA: Diagnosis not present

## 2021-05-10 DIAGNOSIS — E118 Type 2 diabetes mellitus with unspecified complications: Secondary | ICD-10-CM

## 2021-05-10 DIAGNOSIS — Z23 Encounter for immunization: Secondary | ICD-10-CM

## 2021-05-10 MED ORDER — ONDANSETRON HCL 4 MG PO TABS: 4.0000 mg | ORAL_TABLET | Freq: Three times a day (TID) | ORAL | 3 refills | Status: DC | PRN

## 2021-05-10 MED ORDER — LOVASTATIN 20 MG PO TABS
20.0000 mg | ORAL_TABLET | Freq: Every day | ORAL | 3 refills | Status: DC
Start: 2021-05-10 — End: 2021-12-10

## 2021-05-10 NOTE — Assessment & Plan Note (Signed)
C/o nausea x 4wks, worse after breakfast. On Victoza 1.2 mg: go to 0.6 mg qd or eat smaller meals 5/day and stay on 1.2 mg/d Zofran prn

## 2021-05-10 NOTE — Progress Notes (Signed)
Subjective:  Patient ID: [name], female    DOB: 1940-11-03  Age: 80 y.o. MRN: [id number]  CC: Follow-up (3 MONTH F/U- Flu shot)   HPI [facility] PATIENT presents for DM, HTN, OA C/o nausea x 4wks, worse after breakfast. On Victoza 1.2 mg  Outpatient Medications Prior to Visit  Medication Sig Dispense Refill   aspirin EC 81 MG tablet Take 1 tablet (81 mg total) by mouth daily. 100 tablet 3   b complex vitamins tablet Take 1 tablet by mouth daily. Vitamin supplement     BD PEN NEEDLE NANO U/F 32G X 4 MM MISC USE AS DIRECTED TWICE DAILY. 200 each 3   Blood Glucose Monitoring Suppl ([facility]) w/Device KIT Use as instructed to check blood sugar 5 times daily. 1 kit 0   busPIRone (BUSPAR) 15 MG tablet Take 1 tablet by mouth 2 (two) times daily.     calcium-vitamin D (OSCAL WITH D) 500-200 MG-UNIT per tablet Take 1 tablet by mouth daily with breakfast.     cetirizine (ZYRTEC) 10 MG tablet Take 10 mg by mouth daily as needed for allergies.     cholecalciferol (VITAMIN D) 1000 units tablet Take 2,000 Units by mouth daily.     dicyclomine (BENTYL) 10 MG capsule Take 1 capsule (10 mg total) by mouth 2 (two) times daily as needed for spasms. 180 capsule 4   estradiol (CLIMARA - DOSED IN MG/24 HR) 0.025 mg/24hr patch Place 1 patch (0.025 mg total) onto the skin once a week. APPLY 1 PATCH ONTO SKIN ONCE WEEKLY 12 patch 4   famotidine (PEPCID) 20 MG tablet TAKE 1 TABLET BY MOUTH EVERYDAY AT BEDTIME 90 tablet 4   L-Methylfolate (DEPLIN) 15 MG TABS Take 15 mg by mouth daily. For treatment of depression     Lancet Devices (ONE TOUCH DELICA LANCING DEV) MISC Use daily as directed 1 each 0   Liniments (SALONPAS PAIN RELIEF PATCH EX) Apply 1 patch topically at bedtime.     LORazepam (ATIVAN) 1 MG tablet Take 1 mg by mouth 4 (four) times daily as needed for anxiety.   2   Magnesium 500 MG CAPS Take 1 capsule by mouth daily.     metFORMIN (GLUCOPHAGE) 500 MG tablet Take 1 tablet by  mouth in the morning and 1 tablet in the evening. 180 tablet 1   Multiple Vitamins-Minerals (MULTIVITAMIN,TX-MINERALS) tablet Take 1 tablet by mouth daily.     ONETOUCH DELICA LANCETS 81J MISC USE UP TO 4 TIMES A DAY 200 each 3   ONETOUCH VERIO test strip USE TO CHECK BLOOD SUGARS 5 TIMES DAILY. 450 each 3   pantoprazole (PROTONIX) 40 MG tablet TAKE 1 TABLET BY MOUTH TWICE A DAY 180 tablet 4   pioglitazone (ACTOS) 30 MG tablet TAKE 1 TABLET BY MOUTH EVERY DAY 90 tablet 1   repaglinide (PRANDIN) 1 MG tablet TAKE 2 TABLETS BY MOUTH BEFORE BREAKFAST AND 1 TABLET AT DINNER (Patient taking differently: TAKE 2 TABLETS BY MOUTH BEFORE BREAKFAST 1 AT LUNCH, AND 1 TABLET AT DINNER) 270 tablet 1   SUMAtriptan (IMITREX) 100 MG tablet 100 mg as needed.     traMADol (ULTRAM) 50 MG tablet TAKE 1 TABLET (50 MG TOTAL) BY MOUTH EVERY 12 (TWELVE) HOURS AS NEEDED FOR SEVERE PAIN. 180 tablet 3   TRINTELLIX 20 MG TABS Take 20 mg by mouth daily.  2   VICTOZA 18 MG/3ML SOPN INJECT 1.2MG INTO MUSCLE DAILY 18 mL 2  VOLTAREN 1 % GEL APPLY 2 GRAMS TO KNEE 2-3 TIMES DAILY PRN  1   lovastatin (MEVACOR) 20 MG tablet Take 1 tablet (20 mg total) by mouth at bedtime. Keep Sept appt w/Lipids check before future refills 30 tablet 0   ondansetron (ZOFRAN) 4 MG tablet TAKE 1 TABLET (4 MG TOTAL) BY MOUTH 2 (TWO) TIMES DAILY. TAKE 1 TABLET BY MOUTH EVERY MORNING AND EVENING AS NEEDED. NEED OFFICE VISIT FOR REFILLS 180 tablet 4   triamcinolone (KENALOG) 0.1 % paste Use on ulcers qid prn (Patient not taking: Reported on 05/10/2021) 5 g 1   No facility-administered medications prior to visit.    ROS: Review of Systems  Objective:  BP 138/82 (BP Location: Left Arm)   Pulse 89   Temp 98.1 F (36.7 C) (Oral)   Ht 5' 2.25" (1.581 m)   Wt 137 lb 3.2 oz (62.2 kg)   SpO2 96%   BMI 24.89 kg/m   BP Readings from Last 3 Encounters:  05/10/21 138/82  02/07/21 130/82  01/18/21 124/78    Wt Readings from Last 3 Encounters:   05/10/21 137 lb 3.2 oz (62.2 kg)  02/07/21 135 lb (61.2 kg)  01/18/21 143 lb 9.6 oz (65.1 kg)    Physical Exam  Lab Results  Component Value Date   WBC 7.1 03/13/2018   HGB 10.8 (L) 03/13/2018   HCT 33.2 (L) 03/13/2018   PLT 412 03/13/2018   GLUCOSE 115 (H) 01/16/2021   CHOL 116 05/10/2020   TRIG 53.0 05/10/2020   HDL 71.30 05/10/2020   LDLCALC 34 05/10/2020   ALT 15 05/10/2020   AST 24 05/10/2020   NA 138 01/16/2021   K 4.4 01/16/2021   CL 102 01/16/2021   CREATININE 1.21 (H) 01/16/2021   BUN 19 01/16/2021   CO2 30 01/16/2021   TSH 1.410 05/17/2020   INR 0.93 11/02/2013   HGBA1C 6.9 (H) 01/16/2021   MICROALBUR 1.4 09/18/2020    MR BRAIN W WO CONTRAST  Result Date: 06/09/2020  [facility] [address], [place], [place] [address] [phone] NEUROIMAGING REPORT STUDY DATE: 06/08/2020 PATIENT NAME: [name] DOB: 03/16/1941 MRN: [id number] EXAM: MRI Brain with and without contrast ORDERING CLINICIAN: Sarina Ill, MD CLINICAL HISTORY: 80 year old woman with tremor and headaches COMPARISON FILMS: CT 05/20/2016 TECHNIQUE:MRI of the brain with and without contrast was obtained utilizing 5 mm axial slices with T1, T2, T2 flair, SWI and diffusion weighted views.  T1 sagittal, T2 coronal and postcontrast views in the axial and coronal plane were obtained. CONTRAST: 13 ml [facility] IMAGING SITE: [facility], [place]. FINDINGS: On sagittal images, the spinal cord is imaged caudally to C3-C4 and is normal in caliber.   The contents of the posterior fossa are of normal size and position.   The pituitary gland and optic chiasm appear normal.    There is moderate generalized cortical atrophy and mild corpus callosum atrophy.  The atrophy has progressed compared to the 2017 CT scan there are no abnormal extra-axial collections of fluid.  In the hemispheres, there are many T2/FLAIR hyperintense foci in the deep, periventricular and  subcortical white matter.  None of these appear to be acute.  The cerebellum and brainstem appears normal.   The deep gray matter appears normal.     Diffusion weighted images are normal.  Susceptibility weighted images are normal.   There have been bilateral lens replacements.  Otherwise, the orbits appear normal.   The VIIth/VIIIth  nerve complex appears normal.  The mastoid air cells appear normal.  The paranasal sinuses appear normal.  Flow voids are identified within the major intracerebral arteries.  After the infusion of contrast material, a normal enhancement pattern is noted.  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   Moderate generalized cortical atrophy, progressed compared to the 2017 CT scan 2.   T2/FLAIR hyperintense foci throughout the hemispheres consistent with moderate chronic microvascular ischemic change. 3.   There were no acute findings and there was a normal enhancement pattern. INTERPRETING PHYSICIAN: Richard A. Felecia Shelling, MD, PhD, FAAN Certified in  Neuroimaging by [employer]    Assessment & Plan:   Problem List Items Addressed This Visit     DM type 2, controlled, with complication ([place])    C/o nausea x 4wks, worse after breakfast. On Victoza 1.2 mg: go to 0.6 mg qd or eat smaller meals 5/day and stay on 1.2 mg/d Zofran prn      Relevant Medications   lovastatin (MEVACOR) 20 MG tablet   Hypercholesteremia    On Lovastatin      Relevant Medications   lovastatin (MEVACOR) 20 MG tablet   Other Relevant Orders   Lipid panel   Low back pain    Continue w/Tramadol prn  Potential benefits of a long term Tramadol/opioids use as well as potential risks (i.e. addiction risk, apnea etc) and complications (i.e. Somnolence, constipation and others) were explained to the patient and were aknowledged.      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 On Victoza 1.2 mg: go to 0.6 mg qd or eat smaller meals 5/day and stay on 1.2 mg/d Zofran prn      Relevant Medications   lovastatin (MEVACOR) 20 MG tablet   Other Visit Diagnoses     Needs flu shot    -  Primary   Relevant Orders   Flu Vaccine QUAD High Dose(Fluad) (Completed)         Follow-up: Return in about 6 weeks (around 06/21/2021) for a follow-up visit.  Walker Kehr, MD

## 2021-05-10 NOTE — Assessment & Plan Note (Signed)
On Lovastatin

## 2021-05-10 NOTE — Patient Instructions (Signed)
Victoza: go to 0.6 mg daily or eat smaller meals 5 times a day and stay on 1.2 mg/day

## 2021-05-10 NOTE — Assessment & Plan Note (Signed)
Continue w/Tramadol prn  Potential benefits of a long term Tramadol/opioids use as well as potential risks (i.e. addiction risk, apnea etc) and complications (i.e. Somnolence, constipation and others) were explained to the patient and were aknowledged.

## 2021-05-16 ENCOUNTER — Ambulatory Visit: Payer: PPO | Admitting: Nurse Practitioner

## 2021-05-22 ENCOUNTER — Other Ambulatory Visit: Payer: PPO

## 2021-05-22 ENCOUNTER — Other Ambulatory Visit (INDEPENDENT_AMBULATORY_CARE_PROVIDER_SITE_OTHER): Payer: PPO

## 2021-05-22 ENCOUNTER — Other Ambulatory Visit: Payer: Self-pay | Admitting: Physician Assistant

## 2021-05-22 DIAGNOSIS — E1165 Type 2 diabetes mellitus with hyperglycemia: Secondary | ICD-10-CM

## 2021-05-22 LAB — HEMOGLOBIN A1C: Hgb A1c MFr Bld: 6.9 % — ABNORMAL HIGH (ref 4.6–6.5)

## 2021-05-22 LAB — BASIC METABOLIC PANEL
BUN: 14 mg/dL (ref 6–23)
CO2: 28 mEq/L (ref 19–32)
Calcium: 9.8 mg/dL (ref 8.4–10.5)
Chloride: 99 mEq/L (ref 96–112)
Creatinine, Ser: 1.09 mg/dL (ref 0.40–1.20)
GFR: 48.23 mL/min — ABNORMAL LOW (ref >60.00)
Glucose, Bld: 140 mg/dL — ABNORMAL HIGH (ref 70–99)
Potassium: 3.9 mEq/L (ref 3.5–5.1)
Sodium: 136 mEq/L (ref 135–145)

## 2021-05-24 ENCOUNTER — Ambulatory Visit (INDEPENDENT_AMBULATORY_CARE_PROVIDER_SITE_OTHER): Payer: PPO | Admitting: Endocrinology

## 2021-05-24 ENCOUNTER — Other Ambulatory Visit: Payer: Self-pay

## 2021-05-24 VITALS — BP 120/80 | HR 110 | Ht 62.0 in | Wt 134.0 lb

## 2021-05-24 DIAGNOSIS — E1165 Type 2 diabetes mellitus with hyperglycemia: Secondary | ICD-10-CM
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
## 2021-05-24 NOTE — Patient Instructions (Signed)
Check blood sugars on waking up 1-2 days a week  Also check blood sugars about 2 hours after meals and do this after different meals by rotation  Recommended blood sugar levels on waking up are 90-130 and about 2 hours after meal is 130-180  Please bring your blood sugar monitor to each visit, thank you  Take 2 Prandin for dessert in pms

## 2021-05-29 ENCOUNTER — Other Ambulatory Visit: Payer: Self-pay

## 2021-05-29 ENCOUNTER — Ambulatory Visit (INDEPENDENT_AMBULATORY_CARE_PROVIDER_SITE_OTHER): Payer: PPO | Admitting: *Deleted

## 2021-05-29 DIAGNOSIS — M810 Age-related osteoporosis without current pathological fracture: Secondary | ICD-10-CM | POA: Diagnosis not present

## 2021-05-29 MED ORDER — DENOSUMAB 60 MG/ML ~~LOC~~ SOSY
60.0000 mg | PREFILLED_SYRINGE | Freq: Once | SUBCUTANEOUS | Status: AC
  Administered 2021-05-29: 60 mg via SUBCUTANEOUS

## 2021-06-05 ENCOUNTER — Ambulatory Visit: Payer: PPO | Admitting: Neurology

## 2021-06-12 ENCOUNTER — Other Ambulatory Visit: Payer: Self-pay

## 2021-06-12 ENCOUNTER — Ambulatory Visit
Admission: RE | Admit: 2021-06-12 | Discharge: 2021-06-12 | Disposition: A | Discharge status: Home / Self Care | Payer: PPO | Source: Ambulatory Visit | Attending: Nurse Practitioner | Admitting: Nurse Practitioner

## 2021-06-12 ENCOUNTER — Ambulatory Visit: Payer: PPO | Admitting: Neurology

## 2021-06-12 DIAGNOSIS — Z1231 Encounter for screening mammogram for malignant neoplasm of breast: Secondary | ICD-10-CM

## 2021-06-12 DIAGNOSIS — G43709 Chronic migraine without aura, not intractable, without status migrainosus: Secondary | ICD-10-CM

## 2021-06-12 MED ORDER — SUMATRIPTAN SUCCINATE 100 MG PO TABS: 100.0000 mg | ORAL_TABLET | ORAL | 11 refills | Status: DC | PRN

## 2021-06-12 NOTE — Progress Notes (Signed)
06/12/2021: just one headache in last 3 months and it was this past weekend(after 14 weeks), will keep it to 14 weeks etc and maybe we can even stop botox. The tremor stopped after Exulti stopped.   Consent Form Botulism Toxin Injection For Chronic Migraine    Reviewed orally with patient, additionally signature is on file:  Botulism toxin has been approved by the Federal drug administration for treatment of chronic migraine. Botulism toxin does not cure chronic migraine and it may not be effective in some patients.  The administration of botulism toxin is accomplished by injecting a small amount of toxin into the muscles of the neck and head. Dosage must be titrated for each individual. Any benefits resulting from botulism toxin tend to wear off after 3 months with a repeat injection required if benefit is to be maintained. Injections are usually done every 3-4 months with maximum effect peak achieved by about 2 or 3 weeks. Botulism toxin is expensive and you should be sure of what costs you will incur resulting from the injection.  The side effects of botulism toxin use for chronic migraine may include:   -Transient, and usually mild, facial weakness with facial injections  -Transient, and usually mild, head or neck weakness with head/neck injections  -Reduction or loss of forehead facial animation due to forehead muscle weakness  -Eyelid drooping  -Dry eye  -Pain at the site of injection or bruising at the site of injection  -Double vision  -Potential unknown long term risks   Contraindications: You should not have Botox if you are pregnant, nursing, allergic to albumin, have an infection, skin condition, or muscle weakness at the site of the injection, or have myasthenia gravis, Lambert-Eaton syndrome, or ALS.  It is also possible that as with any injection, there may be an allergic reaction or no effect from the medication. Reduced effectiveness after repeated injections is  sometimes seen and rarely infection at the injection site may occur. All care will be taken to prevent these side effects. If therapy is given over a long time, atrophy and wasting in the muscle injected may occur. Occasionally the patient's become refractory to treatment because they develop antibodies to the toxin. In this event, therapy needs to be modified.  I have read the above information and consent to the administration of botulism toxin.    BOTOX PROCEDURE NOTE FOR MIGRAINE HEADACHE  Contraindications and precautions discussed with patient(above). Aseptic procedure was observed and patient tolerated procedure. Procedure performed by Debbora Presto, FNP-C.   The condition has existed for more than 6 months, and pt does not have a diagnosis of ALS, Myasthenia Gravis or Lambert-Eaton Syndrome.  Risks and benefits of injections discussed and pt agrees to proceed with the procedure.  Written consent obtained  These injections are medically necessary. Pt  receives good benefits from these injections. These injections do not cause sedations or hallucinations which the oral therapies may cause.   Description of procedure:  The patient was placed in a sitting position. The standard protocol was used for Botox as follows, with 5 units of Botox injected at each site:  -Procerus muscle, midline injection  -Corrugator muscle, bilateral injection  -Frontalis muscle, bilateral injection, with 2 sites each side, medial injection was performed in the upper one third of the frontalis muscle, in the region vertical from the medial inferior edge of the superior orbital rim. The lateral injection was again in the upper one third of the forehead vertically above the lateral  limbus of the cornea, 1.5 cm lateral to the medial injection site.  -Temporalis muscle injection, 4 sites, bilaterally. The first injection was 3 cm above the tragus of the ear, second injection site was 1.5 cm to 3 cm up from the first  injection site in line with the tragus of the ear. The third injection site was 1.5-3 cm forward between the first 2 injection sites. The fourth injection site was 1.5 cm posterior to the second injection site. 5th site laterally in the temporalis  muscleat the level of the outer canthus.  -Occipitalis muscle injection, 3 sites, bilaterally. The first injection was done one half way between the occipital protuberance and the tip of the mastoid process behind the ear. The second injection site was done lateral and superior to the first, 1 fingerbreadth from the first injection. The third injection site was 1 fingerbreadth superiorly and medially from the first injection site.  -Cervical paraspinal muscle injection, 2 sites, bilaterally. The first injection site was 1 cm from the midline of the cervical spine, 3 cm inferior to the lower border of the occipital protuberance. The second injection site was 1.5 cm superiorly and laterally to the first injection site.  -Trapezius muscle injection was performed at 3 sites, bilaterally. The first injection site was in the upper trapezius muscle halfway between the inflection point of the neck, and the acromion. The second injection site was one half way between the acromion and the first injection site. The third injection was done between the first injection site and the inflection point of the neck.   Will return for repeat injection in 3 months.   A total of 155 units of Botox was prepared, 155 units of Botox was injected as documented above, 45U Botox not injected was wasted. The patient tolerated the procedure well, there were no complications of the above procedure.  Agree with procedure   Sarina Ill, MD [facility]

## 2021-06-12 NOTE — Progress Notes (Signed)
Botox- 200 units x 1 vial Lot: [id number] Expiration: 11/2023 NDC: 8845-7334-48  Bacteriostatic 0.9% Sodium Chloride- 2mL total Lot: [id number] Expiration: 08/2022 NDC: 6895-7022-02  Dx: W69.167 B/B

## 2021-06-21 ENCOUNTER — Other Ambulatory Visit: Payer: Self-pay

## 2021-06-21 ENCOUNTER — Ambulatory Visit (INDEPENDENT_AMBULATORY_CARE_PROVIDER_SITE_OTHER): Payer: PPO | Admitting: Internal Medicine

## 2021-06-21 ENCOUNTER — Encounter: Payer: Self-pay | Admitting: Internal Medicine

## 2021-06-21 VITALS — BP 121/72 | HR 89 | Temp 98.2°F | Ht 62.0 in | Wt 133.4 lb

## 2021-06-21 DIAGNOSIS — N183 Chronic kidney disease, stage 3 unspecified: Secondary | ICD-10-CM

## 2021-06-21 DIAGNOSIS — K219 Gastro-esophageal reflux disease without esophagitis: Secondary | ICD-10-CM

## 2021-06-21 DIAGNOSIS — K589 Irritable bowel syndrome without diarrhea: Secondary | ICD-10-CM | POA: Diagnosis not present

## 2021-06-21 DIAGNOSIS — R1013 Epigastric pain: Secondary | ICD-10-CM | POA: Diagnosis not present

## 2021-06-21 DIAGNOSIS — R112 Nausea with vomiting, unspecified: Secondary | ICD-10-CM | POA: Diagnosis not present

## 2021-06-21 LAB — COMPREHENSIVE METABOLIC PANEL
ALT: 15 U/L (ref 0–35)
AST: 24 U/L (ref 0–37)
Albumin: 4.2 g/dL (ref 3.5–5.2)
Alkaline Phosphatase: 48 U/L (ref 39–117)
BUN: 17 mg/dL (ref 6–23)
CO2: 31 mEq/L (ref 19–32)
Calcium: 10.1 mg/dL (ref 8.4–10.5)
Chloride: 97 mEq/L (ref 96–112)
Creatinine, Ser: 1.34 mg/dL — ABNORMAL HIGH (ref 0.40–1.20)
GFR: 37.63 mL/min — ABNORMAL LOW (ref >60.00)
Glucose, Bld: 211 mg/dL — ABNORMAL HIGH (ref 70–99)
Potassium: 4.5 mEq/L (ref 3.5–5.1)
Sodium: 134 mEq/L — ABNORMAL LOW (ref 135–145)
Total Bilirubin: 0.3 mg/dL (ref 0.2–1.2)
Total Protein: 7.4 g/dL (ref 6.0–8.3)

## 2021-06-21 LAB — LIPASE: Lipase: 20 U/L (ref 11.0–59.0)

## 2021-06-21 LAB — CBC WITH DIFFERENTIAL/PLATELET
Basophils Absolute: 0.1 10*3/uL (ref 0.0–0.1)
Basophils Relative: 2.1 % (ref 0.0–3.0)
Eosinophils Absolute: 0.3 10*3/uL (ref 0.0–0.7)
Eosinophils Relative: 4.1 % (ref 0.0–5.0)
HCT: 35.6 % — ABNORMAL LOW (ref 36.0–46.0)
Hemoglobin: 11.6 g/dL — ABNORMAL LOW (ref 12.0–15.0)
Lymphocytes Relative: 31.7 % (ref 12.0–46.0)
Lymphs Abs: 2.2 10*3/uL (ref 0.7–4.0)
MCHC: 32.5 g/dL (ref 30.0–36.0)
MCV: 96.5 fl (ref 78.0–100.0)
Monocytes Absolute: 0.6 10*3/uL (ref 0.1–1.0)
Monocytes Relative: 8 % (ref 3.0–12.0)
Neutro Abs: 3.8 10*3/uL (ref 1.4–7.7)
Neutrophils Relative %: 54.1 % (ref 43.0–77.0)
Platelets: 400 10*3/uL (ref 150.0–400.0)
RBC: 3.69 Mil/uL — ABNORMAL LOW (ref 3.87–5.11)
RDW: 14.4 % (ref 11.5–15.5)
WBC: 6.9 10*3/uL (ref 4.0–10.5)

## 2021-06-21 MED ORDER — SUCRALFATE 1 G PO TABS: 1.0000 g | ORAL_TABLET | Freq: Three times a day (TID) | ORAL | 3 refills | Status: DC

## 2021-06-21 NOTE — Patient Instructions (Addendum)
   Stop taking Dicyclomine, Zofran at 8 am Take Pantoprazole at lunch. Hold Victoza x 1 week  Start Carafate in 3 days

## 2021-06-21 NOTE — Progress Notes (Signed)
Subjective:  Patient ID: [name], female    DOB: 12-05-1940  Age: 80 y.o. MRN: [id number]  CC: Follow-up (6 week f/u)   HPI [name] presents for being sick to the stomach, burning x 2 months. Pt held vitamins, Buspar - not better. Do I have an ulcer? Pt takes Dicyclomine, Pantoprazole, Zofran at 8 am. Nausea starts at 9 am..Marland KitchenGaviscon tablets help. Eating hurts... Last EGD 2019  Outpatient Medications Prior to Visit  Medication Sig Dispense Refill   aspirin EC 81 MG tablet Take 1 tablet (81 mg total) by mouth daily. 100 tablet 3   BD PEN NEEDLE NANO U/F 32G X 4 MM MISC USE AS DIRECTED TWICE DAILY. 200 each 3   Blood Glucose Monitoring Suppl (Advance) w/Device KIT Use as instructed to check blood sugar 5 times daily. 1 kit 0   dicyclomine (BENTYL) 10 MG capsule TAKE 1 CAPSULE (10 MG TOTAL) BY MOUTH 2 (TWO) TIMES DAILY AS NEEDED FOR SPASMS. 180 capsule 1   estradiol (CLIMARA - DOSED IN MG/24 HR) 0.025 mg/24hr patch Place 1 patch (0.025 mg total) onto the skin once a week. APPLY 1 PATCH ONTO SKIN ONCE WEEKLY 12 patch 4   famotidine (PEPCID) 20 MG tablet TAKE 1 TABLET BY MOUTH EVERYDAY AT BEDTIME 90 tablet 4   L-Methylfolate (DEPLIN) 15 MG TABS Take 15 mg by mouth daily. For treatment of depression     Lancet Devices (ONE TOUCH DELICA LANCING DEV) MISC Use daily as directed 1 each 0   Liniments (SALONPAS PAIN RELIEF PATCH EX) Apply 1 patch topically at bedtime.     LORazepam (ATIVAN) 1 MG tablet Take 1 mg by mouth 4 (four) times daily as needed for anxiety.   2   lovastatin (MEVACOR) 20 MG tablet Take 1 tablet (20 mg total) by mouth at bedtime. 90 tablet 3   metFORMIN (GLUCOPHAGE) 500 MG tablet Take 1 tablet by mouth in the morning and 1 tablet in the evening. 180 tablet 1   Multiple Vitamins-Minerals (MULTIVITAMIN,TX-MINERALS) tablet Take 1 tablet by mouth daily.     ondansetron (ZOFRAN) 4 MG tablet TAKE 1 TABLET BY MOUTH EVERY MORNING AND 1 IN THE EVENING  AS NEEDED. 180 tablet 3   ONETOUCH DELICA LANCETS 35K MISC USE UP TO 4 TIMES A DAY 200 each 3   ONETOUCH VERIO test strip USE TO CHECK BLOOD SUGARS 5 TIMES DAILY. 450 each 3   pantoprazole (PROTONIX) 40 MG tablet TAKE 1 TABLET BY MOUTH TWICE A DAY 180 tablet 4   pioglitazone (ACTOS) 30 MG tablet TAKE 1 TABLET BY MOUTH EVERY DAY 90 tablet 1   repaglinide (PRANDIN) 1 MG tablet TAKE 2 TABLETS BY MOUTH BEFORE BREAKFAST AND 1 TABLET AT DINNER (Patient taking differently: TAKE 2 TABLETS BY MOUTH BEFORE BREAKFAST 1 AT LUNCH, AND 1 TABLET AT DINNER) 270 tablet 1   SUMAtriptan (IMITREX) 100 MG tablet Take 1 tablet (100 mg total) by mouth every 2 (two) hours as needed. Max twice a day. 10 tablet 11   traMADol (ULTRAM) 50 MG tablet TAKE 1 TABLET (50 MG TOTAL) BY MOUTH EVERY 12 (TWELVE) HOURS AS NEEDED FOR SEVERE PAIN. 180 tablet 3   TRINTELLIX 20 MG TABS Take 20 mg by mouth daily.  2   VICTOZA 18 MG/3ML SOPN INJECT 1.2MG INTO MUSCLE DAILY 18 mL 2   VOLTAREN 1 % GEL APPLY 2 GRAMS TO KNEE 2-3 TIMES DAILY PRN  1   b complex vitamins  tablet Take 1 tablet by mouth daily. Vitamin supplement (Patient not taking: Reported on 06/21/2021)     busPIRone (BUSPAR) 15 MG tablet Take 1 tablet by mouth 2 (two) times daily. (Patient not taking: Reported on 06/21/2021)     calcium-vitamin D (OSCAL WITH D) 500-200 MG-UNIT per tablet Take 1 tablet by mouth daily with breakfast. (Patient not taking: Reported on 06/21/2021)     cetirizine (ZYRTEC) 10 MG tablet Take 10 mg by mouth daily as needed for allergies. (Patient not taking: Reported on 06/21/2021)     cholecalciferol (VITAMIN D) 1000 units tablet Take 2,000 Units by mouth daily. (Patient not taking: Reported on 06/21/2021)     Magnesium 500 MG CAPS Take 1 capsule by mouth daily. (Patient not taking: Reported on 06/21/2021)     No facility-administered medications prior to visit.    ROS: Review of Systems  Constitutional:  Positive for fatigue and unexpected weight  change. Negative for activity change, appetite change and chills.  HENT:  Negative for congestion, mouth sores and sinus pressure.   Eyes:  Negative for visual disturbance.  Respiratory:  Negative for cough and chest tightness.   Gastrointestinal:  Positive for abdominal distention, abdominal pain, constipation and nausea.  Genitourinary:  Negative for difficulty urinating, frequency and vaginal pain.  Musculoskeletal:  Positive for arthralgias and back pain. Negative for gait problem.  Skin:  Negative for pallor and rash.  Neurological:  Negative for dizziness, tremors, weakness, numbness and headaches.  Psychiatric/Behavioral:  Positive for decreased concentration. Negative for confusion, sleep disturbance and suicidal ideas. The patient is nervous/anxious.    Objective:  BP 121/72 (BP Location: Left Arm)   Pulse 89   Temp 98.2 F (36.8 C) (Oral)   Ht '5\' 2"'  (1.575 m)   Wt 133 lb 6.4 oz (60.5 kg)   SpO2 96%   BMI 24.40 kg/m   BP Readings from Last 3 Encounters:  06/21/21 121/72  05/24/21 120/80  05/10/21 138/82    Wt Readings from Last 3 Encounters:  06/21/21 133 lb 6.4 oz (60.5 kg)  05/24/21 134 lb (60.8 kg)  05/10/21 137 lb 3.2 oz (62.2 kg)    Physical Exam Constitutional:      General: She is not in acute distress.    Appearance: Normal appearance. She is well-developed.  HENT:     Head: Normocephalic.     Right Ear: External ear normal.     Left Ear: External ear normal.     Nose: Nose normal.  Eyes:     General:        Right eye: No discharge.        Left eye: No discharge.     Conjunctiva/sclera: Conjunctivae normal.     Pupils: Pupils are equal, round, and reactive to light.  Neck:     Thyroid: No thyromegaly.     Vascular: No JVD.     Trachea: No tracheal deviation.  Cardiovascular:     Rate and Rhythm: Normal rate and regular rhythm.     Heart sounds: Normal heart sounds.  Pulmonary:     Effort: No respiratory distress.     Breath sounds: No  stridor. No wheezing.  Abdominal:     General: Bowel sounds are normal. There is no distension.     Palpations: Abdomen is soft. There is no mass.     Tenderness: There is no abdominal tenderness. There is no guarding or rebound.  Musculoskeletal:        General: No tenderness.  Cervical back: Normal range of motion and neck supple. No rigidity.  Lymphadenopathy:     Cervical: No cervical adenopathy.  Skin:    Findings: No erythema or rash.  Neurological:     Cranial Nerves: No cranial nerve deficit.     Motor: No abnormal muscle tone.     Coordination: Coordination normal.     Deep Tendon Reflexes: Reflexes normal.  Psychiatric:        Behavior: Behavior normal.        Thought Content: Thought content normal.        Judgment: Judgment normal.    Lab Results  Component Value Date   WBC 6.9 06/21/2021   HGB 11.6 (L) 06/21/2021   HCT 35.6 (L) 06/21/2021   PLT 400.0 06/21/2021   GLUCOSE 211 (H) 06/21/2021   CHOL 116 05/10/2020   TRIG 53.0 05/10/2020   HDL 71.30 05/10/2020   LDLCALC 34 05/10/2020   ALT 15 06/21/2021   AST 24 06/21/2021   NA 134 (L) 06/21/2021   K 4.5 06/21/2021   CL 97 06/21/2021   CREATININE 1.34 (H) 06/21/2021   BUN 17 06/21/2021   CO2 31 06/21/2021   TSH 1.410 05/17/2020   INR 0.93 11/02/2013   HGBA1C 6.9 (H) 05/22/2021   MICROALBUR 1.4 09/18/2020    MM 3D SCREEN BREAST BILATERAL  Result Date: 06/16/2021 CLINICAL DATA:  Screening. History of RIGHT breast excisional biopsy. EXAM: DIGITAL SCREENING BILATERAL MAMMOGRAM WITH TOMOSYNTHESIS AND CAD TECHNIQUE: Bilateral screening digital craniocaudal and mediolateral oblique mammograms were obtained. Bilateral screening digital breast tomosynthesis was performed. The images were evaluated with computer-aided detection. COMPARISON:  Previous exam(s). ACR Breast Density Category c: The breast tissue is heterogeneously dense, which may obscure small masses. FINDINGS: There are no findings suspicious for  malignancy. IMPRESSION: No mammographic evidence of malignancy. A result letter of this screening mammogram will be mailed directly to the patient. RECOMMENDATION: Screening mammogram in one year. (Code:SM-B-01Y) BI-RADS CATEGORY  1: Negative. Electronically Signed   By: Franki Cabot M.D.   On: 06/16/2021 09:36    Assessment & Plan:   Problem List Items Addressed This Visit     CRI (chronic renal insufficiency), stage 3 (moderate) (HCC)    Hydrate well Check CMET      Epigastric pain - Primary    C/o being sick to the stomach, burning x 2 months. Pt held vitamins, Buspar - not better. Do I have an ulcer? Pt takes Dicyclomine, Pantoprazole, Zofran at 8 am. Nausea starts at 9 am..Marland KitchenGaviscon tablets help This is a complication issues.  There is a number of variables.  Differential diagnosis broad including medication side effect, gastroparesis, chronic pancreatitis, malignancy etc. Stop taking Dicyclomine, Zofran at 8 am Take Pantoprazole at lunch. Hold Victoza x 1 week  Carafate qid - start in a few days  Check CBC, CMET, lipase CT abd/pelvis      Relevant Orders   CBC with Differential/Platelet (Completed)   Comprehensive metabolic panel (Completed)   Lipase (Completed)   CT Abdomen Pelvis W Contrast   GERD    Not better Stop taking Dicyclomine, Zofran at 8 am Take Pantoprazole at lunch. Hold Victoza x 1 week      Relevant Medications   sucralfate (CARAFATE) 1 g tablet   Irritable bowel syndrome    Plan - see above      Relevant Medications   sucralfate (CARAFATE) 1 g tablet   Other Visit Diagnoses     Nausea and vomiting, unspecified vomiting  type       Relevant Orders   CT Abdomen Pelvis W Contrast         Meds ordered this encounter  Medications   sucralfate (CARAFATE) 1 g tablet    Sig: Take 1 tablet (1 g total) by mouth 4 (four) times daily -  with meals and at bedtime.    Dispense:  120 tablet    Refill:  3      Follow-up: Return in about 4  weeks (around 07/19/2021) for a follow-up visit.  Walker Kehr, MD

## 2021-06-21 NOTE — Assessment & Plan Note (Signed)
Not better Stop taking Dicyclomine, Zofran at 8 am Take Pantoprazole at lunch. Hold Victoza x 1 week

## 2021-06-21 NOTE — Assessment & Plan Note (Signed)
Plan - see above

## 2021-06-21 NOTE — Assessment & Plan Note (Addendum)
C/o being sick to the stomach, burning x 2 months. Pt held vitamins, Buspar - not better. Do I have an ulcer? Pt takes Dicyclomine, Pantoprazole, Zofran at 8 am. Nausea starts at 9 am..Marland KitchenGaviscon tablets help This is a complication issues.  There is a number of variables.  Differential diagnosis broad including medication side effect, gastroparesis, chronic pancreatitis, malignancy etc. Stop taking Dicyclomine, Zofran at 8 am Take Pantoprazole at lunch. Hold Victoza x 1 week  Carafate qid - start in a few days  Check CBC, CMET, lipase CT abd/pelvis

## 2021-06-21 NOTE — Assessment & Plan Note (Signed)
Hydrate well Check CMET

## 2021-06-24 ENCOUNTER — Other Ambulatory Visit: Payer: Self-pay | Admitting: Endocrinology

## 2021-06-26 ENCOUNTER — Other Ambulatory Visit: Payer: Self-pay

## 2021-06-26 ENCOUNTER — Telehealth: Payer: Self-pay

## 2021-06-26 ENCOUNTER — Encounter: Payer: Self-pay | Admitting: Nurse Practitioner

## 2021-06-26 ENCOUNTER — Ambulatory Visit (INDEPENDENT_AMBULATORY_CARE_PROVIDER_SITE_OTHER): Payer: PPO | Admitting: Nurse Practitioner

## 2021-06-26 VITALS — BP 122/78 | Ht 61.0 in | Wt 131.0 lb

## 2021-06-26 DIAGNOSIS — M810 Age-related osteoporosis without current pathological fracture: Secondary | ICD-10-CM

## 2021-06-26 DIAGNOSIS — Z01419 Encounter for gynecological examination (general) (routine) without abnormal findings: Secondary | ICD-10-CM

## 2021-06-26 DIAGNOSIS — Z780 Asymptomatic menopausal state: Secondary | ICD-10-CM

## 2021-06-26 DIAGNOSIS — Z79890 Hormone replacement therapy (postmenopausal): Secondary | ICD-10-CM

## 2021-06-26 MED ORDER — ESTRADIOL 0.025 MG/24HR TD PTWK: 0.0250 mg | MEDICATED_PATCH | TRANSDERMAL | 0 refills | Status: DC

## 2021-06-26 NOTE — Progress Notes (Signed)
Chronic Care Management Pharmacy Assistant   Name: [name]  MRN: [id number] DOB: 10/22/1940  [name] is an 80 y.o. year old female who presents for his initial CCM visit with the clinical pharmacist.  Reason for Encounter: Initial Chart Prep    Recent office visits:  06/21/21 Plotnikov, Evie Lacks, MD-PCP (Epigastric)  Orders: CBC, CMP, Lipase, CT abdomen pelvis w contrast Medication Changes: sucralfate (CARAFATE) 1 g tablet  05/10/21 Plotnikov, Evie Lacks, MD-PCP (flu shot) Orders: Lipids panel No med changes  02/07/21 Plotnikov, Evie Lacks, MD-PCP (Aphthous ulcer of mouth) No orders, no med changes  Recent consult visits:  05/24/21 Elayne Snare, MD-Endocrinology ( Uncontrolled type 2 diabetes mellitus with hyperglycemia, without long-term current use of insulin) Orders: BMP, HB A1c No med changes  01/18/21 Elayne Snare, MD-Endocrinology (Uncontrolled type 2 diabetes mellitus with hyperglycemia, without long-term current use of insulin)  Orders:BMP, Hem A1c  Hospital visits:  None in previous 6 months  Medications: Outpatient Encounter Medications as of 06/26/2021  Medication Sig Note   aspirin EC 81 MG tablet Take 1 tablet (81 mg total) by mouth daily.    b complex vitamins tablet Take 1 tablet by mouth daily. Vitamin supplement (Patient not taking: Reported on 06/21/2021)    BD PEN NEEDLE NANO U/F 32G X 4 MM MISC USE AS DIRECTED TWICE DAILY.    Blood Glucose Monitoring Suppl (Leonidas) w/Device KIT Use as instructed to check blood sugar 5 times daily.    busPIRone (BUSPAR) 15 MG tablet Take 1 tablet by mouth 2 (two) times daily. (Patient not taking: Reported on 06/21/2021) 06/21/2021: On HOLD   calcium-vitamin D (OSCAL WITH D) 500-200 MG-UNIT per tablet Take 1 tablet by mouth daily with breakfast. (Patient not taking: Reported on 06/21/2021)    cetirizine (ZYRTEC) 10 MG tablet Take 10 mg by mouth daily as needed for allergies. (Patient not  taking: Reported on 06/21/2021)    cholecalciferol (VITAMIN D) 1000 units tablet Take 2,000 Units by mouth daily. (Patient not taking: Reported on 06/21/2021)    dicyclomine (BENTYL) 10 MG capsule TAKE 1 CAPSULE (10 MG TOTAL) BY MOUTH 2 (TWO) TIMES DAILY AS NEEDED FOR SPASMS.    estradiol (CLIMARA - DOSED IN MG/24 HR) 0.025 mg/24hr patch Place 1 patch (0.025 mg total) onto the skin once a week. APPLY 1 PATCH ONTO SKIN ONCE WEEKLY    famotidine (PEPCID) 20 MG tablet TAKE 1 TABLET BY MOUTH EVERYDAY AT BEDTIME    L-Methylfolate (DEPLIN) 15 MG TABS Take 15 mg by mouth daily. For treatment of depression    Lancet Devices (ONE TOUCH DELICA LANCING DEV) MISC Use daily as directed    Liniments (SALONPAS PAIN RELIEF PATCH EX) Apply 1 patch topically at bedtime.    LORazepam (ATIVAN) 1 MG tablet Take 1 mg by mouth 4 (four) times daily as needed for anxiety.  08/03/2015: Received from: External Pharmacy Received Sig: TK 1 T PO  QID PRN   lovastatin (MEVACOR) 20 MG tablet Take 1 tablet (20 mg total) by mouth at bedtime.    Magnesium 500 MG CAPS Take 1 capsule by mouth daily. (Patient not taking: Reported on 06/21/2021)    metFORMIN (GLUCOPHAGE) 500 MG tablet Take 1 tablet by mouth in the morning and 1 tablet in the evening.    Multiple Vitamins-Minerals (MULTIVITAMIN,TX-MINERALS) tablet Take 1 tablet by mouth daily.    ondansetron (ZOFRAN) 4 MG tablet TAKE 1 TABLET BY MOUTH EVERY MORNING AND 1 IN THE  EVENING AS NEEDED.    ONETOUCH DELICA LANCETS 65K MISC USE UP TO 4 TIMES A DAY    ONETOUCH VERIO test strip USE TO CHECK BLOOD SUGARS 5 TIMES DAILY.    pantoprazole (PROTONIX) 40 MG tablet TAKE 1 TABLET BY MOUTH TWICE A DAY    pioglitazone (ACTOS) 30 MG tablet TAKE 1 TABLET BY MOUTH EVERY DAY    repaglinide (PRANDIN) 1 MG tablet TAKE 2 TABLETS BY MOUTH BEFORE BREAKFAST AND 1 TABLET AT DINNER    sucralfate (CARAFATE) 1 g tablet Take 1 tablet (1 g total) by mouth 4 (four) times daily -  with meals and at bedtime.     SUMAtriptan (IMITREX) 100 MG tablet Take 1 tablet (100 mg total) by mouth every 2 (two) hours as needed. Max twice a day.    traMADol (ULTRAM) 50 MG tablet TAKE 1 TABLET (50 MG TOTAL) BY MOUTH EVERY 12 (TWELVE) HOURS AS NEEDED FOR SEVERE PAIN.    TRINTELLIX 20 MG TABS Take 20 mg by mouth daily.    VICTOZA 18 MG/3ML SOPN INJECT 1.2MG INTO MUSCLE DAILY    VOLTAREN 1 % GEL APPLY 2 GRAMS TO KNEE 2-3 TIMES DAILY PRN    No facility-administered encounter medications on file as of 06/26/2021.   Medication List aspirin EC 81 MG tablet b complex vitamins tablet BD PEN NEEDLE NANO U/F 32G X 4 MM MISC Blood Glucose Monitoring Suppl (ONETOUCH VERIO FLEX SYSTEM) w/Device KIT busPIRone (BUSPAR) 15 MG tablet calcium-vitamin D (OSCAL WITH D) 500-200 MG-UNIT per tablet cetirizine (ZYRTEC) 10 MG tablet cholecalciferol (VITAMIN D) 1000 units tablet dicyclomine (BENTYL) 10 MG capsule-last fill 05/22/21 90 ds estradiol (CLIMARA - DOSED IN MG/24 HR) 0.025 mg/24hr patch-last fill 03/26/21 84 ds famotidine (PEPCID) 20 MG tablet-last fill 04/08/21 90 ds L-Methylfolate (DEPLIN) 15 MG TABS Lancet Devices (ONE TOUCH DELICA LANCING DEV) MISC Liniments (SALONPAS PAIN RELIEF PATCH EX) LORazepam (ATIVAN) 1 MG tablet-last fill 04/16/21 90 ds lovastatin (MEVACOR) 20 MG tablet-last fill 04/23/21 30 ds Magnesium 500 MG CAPS metFORMIN (GLUCOPHAGE) 500 MG tablet-last fill 05/13/21 90 ds Multiple Vitamins-Minerals (MULTIVITAMIN,TX-MINERALS) tablet ondansetron (ZOFRAN) 4 MG tablet-last fill 04/08/21 90 ds ONETOUCH DELICA LANCETS 81E MISC ONETOUCH VERIO test strip pantoprazole (PROTONIX) 40 MG tablet-last fill 05/29/21 90 ds pioglitazone (ACTOS) 30 MG tablet-last fill 05/02/21 90 ds repaglinide (PRANDIN) 1 MG tablet-last fill 06/25/21 90 ds sucralfate (CARAFATE) 1 g tablet-last fill 06/21/21 90 ds SUMAtriptan (IMITREX) 100 MG tablet-last fill 06/12/21 30 ds traMADol (ULTRAM) 50 MG tablet-last fill 05/08/21 90 ds TRINTELLIX 20 MG  TABS-last fill 05/06/21 90 ds VICTOZA 18 MG/3ML SOPN VOLTAREN 1 % GEL   Care Gaps: Colonoscopy-12/05/15 Diabetic Foot Exam-09/18/20 Mammogram-NA Ophthalmology-12/11/20 Dexa Scan - NA Annual Well Visit -NA  Micro albumin-09/18/20 Hemoglobin A1c- 01/16/21  Star Rating Drugs: lovastatin 20 MG-last fill 04/23/21 30 ds metFORMIN 500 MG-last fill 05/13/21 90 ds pioglitazone 30 MG-last fill 05/02/21 90 ds  Ethelene Hal Clinical Pharmacist Assistant [phone]

## 2021-06-26 NOTE — Progress Notes (Signed)
[name] [birth year] [id number]   History:  80 y.o. G3P3003 presents for breast and pelvic exam. S/P 1970s TAH BSO on Climara patch 0.025 mg weekly for hot flashes. Normal pap and mammogram history. History of Osteoporosis, on Prolia. T2DM managed by endocrinology. Being evaluated for abdominal discomfort by PCP, has CT scheduled this week.  Gynecologic History No LMP recorded. Patient has had a hysterectomy.   Health maintenance Last Pap: 2012. Results were: Normal Last mammogram: 06/12/2021. Results were: Normal Last colonoscopy: 12/05/2015. Results were: Normal Last Dexa: 06/07/2020. Results were: T-score -2.4 at spine  Past medical history, past surgical history, family history and social history were all reviewed and documented in the EPIC chart. Married. Retired. 3 children - one local, one in [place], one in [place]. 8 granddaughters. Mother deceased from colon cancer in her 67s, son with history of colon cancer in his 48s, sister and niece with history of breast cancer.   ROS:  A ROS was performed and pertinent positives and negatives are included.  Exam:  Vitals:   06/26/21 1327  BP: 122/78  Weight: 131 lb (59.4 kg)  Height: 5\' 1"  (1.549 m)    Body mass index is 24.75 kg/m.  General appearance:  Normal Thyroid:  Symmetrical, normal in size, without palpable masses or nodularity. Respiratory  Auscultation:  Clear without wheezing or rhonchi Cardiovascular  Auscultation:  Regular rate, without rubs, murmurs or gallops  Edema/varicosities:  Not grossly evident Abdominal  Soft,nontender, without masses, guarding or rebound.  Liver/spleen:  No organomegaly noted  Hernia:  None appreciated  Skin  Inspection:  Grossly normal   Breasts: Examined lying and sitting.   Right: Without masses, retractions, discharge or axillary adenopathy.   Left: Without masses, retractions, discharge or axillary adenopathy. Gentitourinary   Inguinal/mons:  Normal without  inguinal adenopathy  External genitalia:  Normal, atrophic changes  BUS/Urethra/Skene's glands:  Normal  Vagina:  Speculum exam not performed  Cervix:  Absent  Uterus: Absent  Adnexa/parametria:     Rt: Without masses or tenderness.   Lt: Without masses or tenderness.  Anus and perineum: Normal  Digital rectal exam: Weak sphincter tone without palpated masses or tenderness  Patient informed chaperone available to be present for breast and pelvic exam. Patient has requested no chaperone to be present. Patient has been advised what will be completed during breast and pelvic exam.   Assessment/Plan:  80 y.o. G3P3003 for breast and pelvic ex  Well female exam with routine gynecological exam - Education provided on SBEs, importance of preventative screenings, current guidelines, high calcium diet, regular exercise, and multivitamin daily. Labs with PCP and endocrinology.   Hormone replacement therapy (HRT) - Plan: estradiol (CLIMARA - DOSED IN MG/24 HR) 0.025 mg/24hr patch weekly. She has never tried to wean and she is agreeable to try. She will decrease to 1/2 patch weekly and if tolerating she will stop use. She is aware of the risk for blood clots, heart attack, stroke, and breast cancer with continued use.   Age-related osteoporosis without current pathological fracture - Most recent T-score -2.4 (05/2020). On Prolia. Most recent dose 05/2021. Will repeat DXA next year.  Postmenopausal - on HRT. S/P TAH BSO in 1970s for fibroids and endometriosis  Screening for cervical cancer - Normal Pap history. No longer screening per guidelines.   Screening for breast cancer - Normal mammogram history.  Continue annual screenings.  Normal breast exam today.  Screening for colon cancer - 2017 colonoscopy. Screenings no longer recommended  per GI.   Follow up in 1 year for annual.    Tamela Gammon [facility], 1:49 PM 06/26/2021

## 2021-06-27 ENCOUNTER — Ambulatory Visit (INDEPENDENT_AMBULATORY_CARE_PROVIDER_SITE_OTHER): Payer: PPO

## 2021-06-27 DIAGNOSIS — G8929 Other chronic pain: Secondary | ICD-10-CM

## 2021-06-27 DIAGNOSIS — N183 Chronic kidney disease, stage 3 unspecified: Secondary | ICD-10-CM

## 2021-06-27 DIAGNOSIS — E7800 Pure hypercholesterolemia, unspecified: Secondary | ICD-10-CM

## 2021-06-27 DIAGNOSIS — E785 Hyperlipidemia, unspecified: Secondary | ICD-10-CM

## 2021-06-27 DIAGNOSIS — E118 Type 2 diabetes mellitus with unspecified complications: Secondary | ICD-10-CM

## 2021-06-27 DIAGNOSIS — K219 Gastro-esophageal reflux disease without esophagitis: Secondary | ICD-10-CM

## 2021-06-27 DIAGNOSIS — M5440 Lumbago with sciatica, unspecified side: Secondary | ICD-10-CM

## 2021-06-27 DIAGNOSIS — R1013 Epigastric pain: Secondary | ICD-10-CM

## 2021-06-27 NOTE — Progress Notes (Addendum)
Chronic Care Management Pharmacy Note  06/27/2021 Name:  [name] MRN:  [id number] DOB:  1940-09-24  Summary: -Patient reports that since latest PCP OV where sucralfate was started and different medications were put on hold abdominal pain and acid reflux has improved.   -Patient is scheduled for CT scan of abdomen tomorrow and will follow up with PCP in 2 weeks to discuss results -Patient currently holding buspirone, denies any issues since stopping, plan with [facility] provider is to discontinue should she not have any issues with hold of buspirone -Patient reports at blood sugars typically are averaging ~140, can elevate in the evening depending on her diet  -LDL well controlled with last check - currently holding lovastatin (has only been 1 week) but would not expect short term hold to lead to elevation if LDL out of goal range -Pain controlled on tramadol, APAP, voltaren gel, and salonpas patches  -Patient continues on prolia injections for osteoporosis - latest dexa scan appears improved compared to previous scan - continue current regimen   Recommendations/Changes made from today's visit: -Recommending no changes at this time - patient is scheduled to restart vicotza tomorrow, as she has only been off for 1 week, will restart at previous 1.48m daily dose -Patient to follow up with PCP to discuss results of CT scan tomorrow / determine medications that will be restarted  -Recommend continued monitoring of kidney function, adjust medications should kidney function decline   Subjective: Cheryl DOUGLASSis an 80y.o. year old female who is a primary patient of Plotnikov, AEvie Lacks MD.  The CCM team was consulted for assistance with disease management and care coordination needs.    Engaged with patient by telephone for initial visit in response to provider referral for pharmacy case management and/or care coordination services.   Consent to Services:  The patient was given the following  information about Chronic Care Management services today, agreed to services, and gave verbal consent: 1. CCM service includes personalized support from designated clinical staff supervised by the primary care provider, including individualized plan of care and coordination with other care providers 2. 24/7 contact phone numbers for assistance for urgent and routine care needs. 3. Service will only be billed when office clinical staff spend 20 minutes or more in a month to coordinate care. 4. Only one practitioner may furnish and bill the service in a calendar month. 5.The patient may stop CCM services at any time (effective at the end of the month) by phone call to the office staff. 6. The patient will be responsible for cost sharing (co-pay) of up to 20% of the service fee (after annual deductible is met). Patient agreed to services and consent obtained.  Patient Care Team: Plotnikov, AEvie Lacks MD as PCP - General KChucky May MD (Psychiatry) KBerle Mull MD as Attending Physician (Family Medicine) Regal, NTamala Fothergill DPM as Consulting Physician (Podiatry) SLadene Artist MD as Consulting Physician (Gastroenterology) AMelvenia Beam MD as Consulting Physician (Neurology) KElayne Snare MD as Consulting Physician (Endocrinology) HMonna Fam MD as Consulting Physician (Ophthalmology) STomasa Blase RSt [facility] & Medical Centeras Pharmacist (Pharmacist)  Recent office visits:  06/21/21 Plotnikov, AEvie Lacks MD-PCP (Epigastric pain)  Orders: CBC, CMP, Lipase, CT abdomen pelvis w contrast Medication Changes: start - sucralfate (CARAFATE) 1 g tablet qid  Stop - dicyclomine, zofran, hold victoza x 1 week    05/10/21 Plotnikov, AEvie Lacks MD-PCP (flu shot) Orders: Lipids panel No med changes   02/07/21 Plotnikov, AEvie Lacks MD-PCP (Aphthous  ulcer of mouth) Given Rx for Triamc paste   Recent consult visits:  05/24/21 Elayne Snare, MD-Endocrinology ( Uncontrolled type 2 diabetes mellitus with hyperglycemia,  without long-term current use of insulin) Orders: BMP, HB A1c No med changes   01/18/21 Elayne Snare, MD-Endocrinology (Uncontrolled type 2 diabetes mellitus with hyperglycemia, without long-term current use of insulin)  Orders:BMP, Hem A1c   Hospital visits:  None in previous 6 months  Objective:  Lab Results  Component Value Date   CREATININE 1.34 (H) 06/21/2021   BUN 17 06/21/2021   GFR 37.63 (L) 06/21/2021   GFRNONAA 40 (L) 03/13/2018   GFRAA 46 (L) 03/13/2018   NA 134 (L) 06/21/2021   K 4.5 06/21/2021   CALCIUM 10.1 06/21/2021   CO2 31 06/21/2021   GLUCOSE 211 (H) 06/21/2021    Lab Results  Component Value Date/Time   HGBA1C 6.9 (H) 05/22/2021 10:29 AM   HGBA1C 6.9 (H) 01/16/2021 10:21 AM   FRUCTOSAMINE 286 (H) 09/29/2017 10:28 AM   GFR 37.63 (L) 06/21/2021 02:05 PM   GFR 48.23 (L) 05/22/2021 10:29 AM   MICROALBUR 1.4 09/18/2020 03:14 PM   MICROALBUR 1.4 09/09/2019 11:07 AM    Last diabetic Eye exam:  Lab Results  Component Value Date/Time   HMDIABEYEEXA No Retinopathy 04/12/2021 12:00 AM    Last diabetic Foot exam:  No results found for: HMDIABFOOTEX   Lab Results  Component Value Date   CHOL 116 05/10/2020   HDL 71.30 05/10/2020   LDLCALC 34 05/10/2020   TRIG 53.0 05/10/2020   CHOLHDL 2 05/10/2020    Hepatic Function Latest Ref Rng & Units 06/21/2021 05/10/2020 01/07/2020  Total Protein 6.0 - 8.3 g/dL 7.4 6.9 6.7  Albumin 3.5 - 5.2 g/dL 4.2 4.0 4.0  AST 0 - 37 U/L _0 ALT 0 - 35 U/L _1 Alk Phosphatase 39 - 117 U/L 48 48 52  Total Bilirubin 0.2 - 1.2 mg/dL 0.3 0.4 0.3  Bilirubin, Direct 0.0 - 0.3 mg/dL - - -    Lab Results  Component Value Date/Time   TSH 1.410 05/17/2020 02:46 PM   TSH 1.29 12/02/2017 11:57 AM    CBC Latest Ref Rng & Units 06/21/2021 03/13/2018 12/02/2017  WBC 4.0 - 10.5 K/uL 6.9 7.1 9.1  Hemoglobin 12.0 - 15.0 g/dL 11.6(L) 10.8(L) 11.4(L)  Hematocrit 36.0 - 46.0 % 35.6(L) 33.2(L) 34.1(L)  Platelets 150.0 - 400.0  K/uL 400.0 412 405.0(H)    Lab Results  Component Value Date/Time   VD25OH 53.1 06/11/2017 02:53 PM   VD25OH 55.3 03/14/2017 03:51 PM    Clinical ASCVD: No  The ASCVD Risk score (Arnett DK, et al., 2019) failed to calculate for the following reasons:   The valid total cholesterol range is 130 to 320 mg/dL    Depression screen [facility] 2/9 02/14/2021 02/07/2021 09/19/2020  Decreased Interest 0 0 -  Down, Depressed, Hopeless 0 0 0  PHQ - 2 Score 0 0 0  Altered sleeping 0 0 -  Tired, decreased energy 0 1 -  Change in appetite 0 0 -  Feeling bad or failure about yourself  0 0 -  Trouble concentrating 0 0 -  Moving slowly or fidgety/restless 0 0 -  Suicidal thoughts 0 0 -  PHQ-9 Score 0 1 -  Difficult doing work/chores Not difficult at all Not difficult at all -  Some recent data might be hidden     Social History   Tobacco Use  Smoking Status  Never  Smokeless Tobacco Never   BP Readings from Last 3 Encounters:  06/26/21 122/78  06/21/21 121/72  05/24/21 120/80   Pulse Readings from Last 3 Encounters:  06/21/21 89  05/24/21 (!) 110  05/10/21 89   Wt Readings from Last 3 Encounters:  06/26/21 131 lb (59.4 kg)  06/21/21 133 lb 6.4 oz (60.5 kg)  05/24/21 134 lb (60.8 kg)   BMI Readings from Last 3 Encounters:  06/26/21 24.75 kg/m  06/21/21 24.40 kg/m  05/24/21 24.51 kg/m    Assessment/Interventions: Review of patient past medical history, allergies, medications, health status, including review of consultants reports, laboratory and other test data, was performed as part of comprehensive evaluation and provision of chronic care management services.   SDOH:  (Social Determinants of Health) assessments and interventions performed: Yes  SDOH Screenings   Alcohol Screen: Low Risk    Last Alcohol Screening Score (AUDIT): 0  Depression (PHQ2-9): Low Risk    PHQ-2 Score: 0  Financial Resource Strain: Low Risk    Difficulty of Paying Living Expenses: Not hard at all   Food Insecurity: No Food Insecurity   Worried About Charity fundraiser in the Last Year: Never true   Ran Out of Food in the Last Year: Never true  Housing: Low Risk    Last Housing Risk Score: 0  Physical Activity: Inactive   Days of Exercise per Week: 0 days   Minutes of Exercise per Session: 0 min  Social Connections: Engineer, building services of Communication with Friends and Family: More than three times a week   Frequency of Social Gatherings with Friends and Family: Three times a week   Attends Religious Services: More than 4 times per year   Active Member of Clubs or Organizations: Yes   Attends Music therapist: More than 4 times per year   Marital Status: Married  Stress: No Stress Concern Present   Feeling of Stress : Not at all  Tobacco Use: Low Risk    Smoking Tobacco Use: Never   Smokeless Tobacco Use: Never   Passive Exposure: Not on file  Transportation Needs: No Transportation Needs   Lack of Transportation (Medical): No   Lack of Transportation (Non-Medical): No    CCM Care Plan  Allergies  Allergen Reactions   Amlodipine Other (See Comments)    Dizzy, headaches   Celebrex [Celecoxib] Other (See Comments)    headaches   Topamax [Topiramate] Other (See Comments)    hallucinations   Amitiza [Lubiprostone]     dizziness   Codeine Nausea Only   Gabapentin Other (See Comments)    dizzy   Hydrocodone Nausea Only and Other (See Comments)    Feeling funny,    Meloxicam Other (See Comments)    jittery   Pravastatin Sodium Other (See Comments)    Muscle aches and pains    Medications Reviewed Today     Reviewed by Tamela Gammon, NP (Nurse Practitioner) on 06/26/21 at 1400  Med List Status: <None>   Medication Order Taking? Sig Documenting Provider Last Dose Status Informant  aspirin EC 81 MG tablet [id number] Yes Take 1 tablet (81 mg total) by mouth daily. Plotnikov, Evie Lacks, MD Taking Active Spouse/Significant Other  b  complex vitamins tablet [id number] Yes Take 1 tablet by mouth daily. Vitamin supplement [provider] Taking Active   BD PEN NEEDLE NANO U/F 32G X 4 MM MISC [id number] Yes USE AS DIRECTED TWICE DAILY. Elayne Snare, MD Taking  Active   Blood Glucose Monitoring Suppl ([place]) w/Device KIT [id number] Yes Use as instructed to check blood sugar 5 times daily. Elayne Snare, MD Taking Active   busPIRone (BUSPAR) 15 MG tablet [id number] No Take 1 tablet by mouth 2 (two) times daily.  Patient not taking: No sig reported   [provider] Not Taking Active            Med Note Earnstine Regal   Thu Jun 21, 2021  1:27 PM) On HOLD  calcium-vitamin D (OSCAL WITH D) 500-200 MG-UNIT per tablet [id number] No Take 1 tablet by mouth daily with breakfast.  Patient not taking: No sig reported   [provider] Not Taking Active   cetirizine (ZYRTEC) 10 MG tablet [id number] Yes Take 10 mg by mouth daily as needed for allergies. [provider] Taking Active   cholecalciferol (VITAMIN D) 1000 units tablet [id number] No Take 2,000 Units by mouth daily.  Patient not taking: No sig reported   [provider] Not Taking Active   dicyclomine (BENTYL) 10 MG capsule [id number] No TAKE 1 CAPSULE (10 MG TOTAL) BY MOUTH 2 (TWO) TIMES DAILY AS NEEDED FOR SPASMS.  Patient not taking: Reported on 06/26/2021   Alfredia Ferguson, PA-C Not Taking Active   estradiol (CLIMARA - DOSED IN MG/24 HR) 0.025 mg/24hr patch [id number]  Place 1 patch (0.025 mg total) onto the skin once a week. APPLY 1 PATCH ONTO SKIN ONCE WEEKLY Marny Lowenstein A, NP  Active   famotidine (PEPCID) 20 MG tablet [id number] Yes TAKE 1 TABLET BY MOUTH EVERYDAY AT BEDTIME Esterwood, Amy S, PA-C Taking Active   L-Methylfolate (DEPLIN) 15 MG TABS [id number] Yes Take 15 mg by mouth daily. For treatment of depression [provider] Taking Active Spouse/Significant Other  Lancet Devices ([place] LANCING  DEV) [place] [id number] Yes Use daily as directed Elayne Snare, MD Taking Active   Liniments ([place]) [id number] Yes Apply 1 patch topically at bedtime. [provider] Taking Active Spouse/Significant Other  LORazepam (ATIVAN) 1 MG tablet [id number] Yes Take 1 mg by mouth 4 (four) times daily as needed for anxiety.  [provider] Taking Active Spouse/Significant Other           Med Note Edison Pace, EMMA L   Thu Aug 03, 2015 12:50 PM) Received from: External Pharmacy Received Sig: TK 1 T PO  QID PRN  lovastatin (MEVACOR) 20 MG tablet [id number] No Take 1 tablet (20 mg total) by mouth at bedtime.  Patient not taking: Reported on 06/26/2021   Plotnikov, Evie Lacks, MD Not Taking Active   Magnesium 500 MG CAPS [id number] No Take 1 capsule by mouth daily.  Patient not taking: No sig reported   [provider] Not Taking Active   metFORMIN (GLUCOPHAGE) 500 MG tablet [id number] Yes Take 1 tablet by mouth in the morning and 1 tablet in the evening. Elayne Snare, MD Taking Active   Multiple Vitamins-Minerals [facility]) tablet [id number] No Take 1 tablet by mouth daily.  Patient not taking: Reported on 06/26/2021   [provider] Not Taking Active   ondansetron (ZOFRAN) 4 MG tablet [id number] No TAKE 1 TABLET BY MOUTH EVERY MORNING AND 1 IN THE EVENING AS NEEDED.  Patient not taking: Reported on 06/26/2021   Plotnikov, Evie Lacks, MD Not Taking Active   [facility] LANCETS 67E MISC [id number] Yes USE UP TO 4 TIMES A DAY Elayne Snare, MD Taking Active  ONETOUCH VERIO test strip [id number] Yes USE TO CHECK BLOOD SUGARS 5 TIMES DAILY. Elayne Snare, MD Taking Active   pantoprazole (PROTONIX) 40 MG tablet [id number] Yes TAKE 1 TABLET BY MOUTH TWICE A DAY Esterwood, Amy S, PA-C Taking Active   pioglitazone (ACTOS) 30 MG tablet [id number] Yes TAKE 1 TABLET BY MOUTH EVERY DAY Elayne Snare, MD Taking Active   repaglinide (PRANDIN) 1 MG tablet [id number] Yes TAKE  2 TABLETS BY MOUTH BEFORE BREAKFAST AND 1 TABLET AT Egbert Garibaldi, Vicenta Aly, MD Taking Active   sucralfate (CARAFATE) 1 g tablet [id number] Yes Take 1 tablet (1 g total) by mouth 4 (four) times daily -  with meals and at bedtime. Plotnikov, Evie Lacks, MD Taking Active   SUMAtriptan (IMITREX) 100 MG tablet [id number] Yes Take 1 tablet (100 mg total) by mouth every 2 (two) hours as needed. Max twice a day. Melvenia Beam, MD Taking Active   traMADol Veatrice Bourbon) 50 MG tablet [id number] Yes TAKE 1 TABLET (50 MG TOTAL) BY MOUTH EVERY 12 (TWELVE) HOURS AS NEEDED FOR SEVERE PAIN. Plotnikov, Evie Lacks, MD Taking Active   TRINTELLIX 20 MG TABS [id number] Yes Take 20 mg by mouth daily. [provider] Taking Active            Med Note Amado Coe   Tue May 16, 2020  1:41 PM)    Donna Bernard 18 MG/3ML SOPN [id number] Yes INJECT 1.2MG INTO MUSCLE DAILY Elayne Snare, MD Taking Active   VOLTAREN 1 % GEL [id number] Yes APPLY 2 GRAMS TO KNEE 2-3 TIMES DAILY PRN [provider] Taking Active Spouse/Significant Other           Med Note Amado Coe   Tue May 16, 2020  1:41 PM)    Med List Note Deloris Ping, [place] 07/14/13 1615): DUPLIN-43m-dietary suppliment            Patient Active Problem List   Diagnosis Date Noted   Epigastric pain 06/21/2021   CRI (chronic renal insufficiency), stage 3 (moderate) ([place] 06/21/2021   Aphthous ulcer of mouth 02/07/2021   Memory problem 05/23/2020   History of total abdominal hysterectomy and bilateral salpingo-oophorectomy 05/15/2020   Tremor 02/15/2020   Vertigo 11/23/2018   Osteoporosis 07/15/2018   Lumbar radiculopathy 03/23/2018   Diabetic peripheral neuropathy associated with type 2 diabetes mellitus ([place] 04/10/2017   Chronic nausea 03/10/2017   Hypercholesteremia 10/22/2016   Uncontrolled type 2 diabetes with renal manifestation 10/22/2016   IDA (iron deficiency anemia) 10/16/2016   Anemia 09/04/2016   Intractable chronic migraine  without aura and with status migrainosus 08/12/2016   CAP (community acquired pneumonia) 05/28/2016   Acute confusional state 05/16/2016   Chronic migraine w/o aura w/o status migrainosus, not intractable 04/13/2016   OSA on CPAP 01/30/2016   Acute URI 10/17/2015   Rash 07/12/2015   SI (sacroiliac) pain 03/30/2015   Sciatica of right side 08/08/2014   Acute blood loss anemia 03/09/2014   DJD (degenerative joint disease) of knee 11/10/2013   Constipation 10/29/2013   Obesity 08/04/2013   Osteoarthritis of right knee 08/04/2013   Edema 05/11/2013   Fall 09/16/2012   Neck pain 09/16/2012   Low back pain 09/16/2012   Cerumen impaction 08/07/2011   Acute bronchitis 05/21/2011   Fatigue 08/07/2010   PARESTHESIA 08/07/2010   DYSPHAGIA 07/13/2009   Irritable bowel syndrome 09/24/2007   Elevated liver function tests 09/24/2007   DM type 2, controlled, with complication ([place] 119/50/9326  Anxiety state 06/26/2007  Depression 06/26/2007   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 heart failure 06/26/2007   GERD 06/26/2007   OSTEOARTHRITIS 06/26/2007   COLONIC POLYPS, HX OF 06/26/2007   SPLENECTOMY, TOTAL, HX OF 06/26/2007    Immunization History  Administered Date(s) Administered   Fluad Quad(high Dose 65+) 05/13/2019, 05/23/2020, 05/10/2021   Influenza Split 06/13/2011, 06/02/2012   Influenza Whole 06/07/2009, 04/18/2010   Influenza, High Dose Seasonal PF 06/02/2015, 05/27/2016, 05/15/2017   Influenza,inj,Quad PF,6+ Mos 05/11/2013, 05/16/2014, 05/05/2018   Influenza,inj,quad, With Preservative 05/23/2020   Moderna Sars-Covid-2 Vaccination 09/15/2019, 10/06/2019, 06/12/2020   PFIZER Comirnaty(Gray Top)Covid-19 Tri-Sucrose Vaccine 12/14/2020   Pfizer Covid-19 Vaccine Bivalent Booster 42yr & up 05/22/2021   Pneumococcal Conjugate-13 08/04/2013   Pneumococcal Polysaccharide-23 05/14/2006, 06/24/2017   Td 07/01/2012   Zoster Recombinat (Shingrix) 07/20/2018, 10/21/2018    Zoster, Live 10/29/2009    Conditions to be addressed/monitored:  Hyperlipidemia, Diabetes, GERD, Chronic Kidney Disease, Depression, Anxiety, and Osteoporosis  There are no care plans that you recently modified to display for this patient.     Medication Assistance: None required.  Patient affirms current coverage meets needs.  Care Gaps: Hep C screening Colonoscopy   Patient's preferred pharmacy is:  [id number] [place], [place]: [phone]: [phone] Uses pill box? Yes Pt endorses 100% compliance  Care Plan and Follow Up Patient Decision:  Patient agrees to Care Plan and Follow-up.  Plan: Telephone follow up appointment with care management team member scheduled for:  3 months The patient has been provided with contact information for the care management team and has been advised to call with any health related questions or concerns.   DaTomasa BlasePharmD Clinical Pharmacist, [place] Haute[place]ening examination/treatment/procedure(s) were performed by non-physician practitioner and as supervising physician I was immediately available for consultation/collaboration.  I agree with above. AlLew DawesMD

## 2021-06-27 NOTE — Telephone Encounter (Signed)
MD responded to first medicine. It was sent this morning closing encounter.Marland KitchenJohny Owen

## 2021-06-27 NOTE — Patient Instructions (Signed)
[name],  It was great to talk to you today!  Please call me with any questions or concerns.  Visit Information   PATIENT GOALS/PLAN OF CARE:  Care Plan : CCM Care Plan  Updates made by Tomasa Blase, [place] since 06/27/2021 12:00 AM     Problem: Hyperlipidemia, Diabetes, GERD, Chronic Kidney Disease, Depression, Anxiety, and Osteoporosis   Priority: High  Onset Date: 06/27/2021     Long-Range Goal: Disease Management   Start Date: 06/27/2021  Expected End Date: 06/27/2022  This Visit's Progress: On track  Priority: High  Note:   Current Barriers:  Unable to independently monitor therapeutic efficacy  Pharmacist Clinical Goal(s):  Patient will achieve adherence to monitoring guidelines and medication adherence to achieve therapeutic efficacy through collaboration with PharmD and provider.   Interventions: 1:1 collaboration with Plotnikov, Evie Lacks, MD regarding development and update of comprehensive plan of care as evidenced by provider attestation and co-signature Inter-disciplinary care team collaboration (see longitudinal plan of care) Comprehensive medication review performed; medication list updated in electronic medical record  Hyperlipidemia: (LDL goal < 70) -Controlled Lab Results  Component Value Date   LDLCALC 34 05/10/2020  -Current treatment: Lovastatin 68m - 1 tablet daily (currently on hold due to evaluation of cause of abdominal pain) Aspirin 858m- 1 tablet daily  -Medications previously tried: pravastatin -Current dietary patterns: reports to eating small meals, does not typically eat fried/ fatty foods, limited red meat intake -Current exercise habits: none at this time  -Educated on Cholesterol goals;  Benefits of statin for ASCVD risk reduction; Importance of limiting foods high in cholesterol; Exercise goal of 150 minutes per week; -Counseled on diet and exercise extensively Recommended to continue hold of lovastatin at this time, discuss  restart with PCP at next appointment in 2 weeks  Diabetes (A1c goal <7%) -Controlled Lab Results  Component Value Date   HGBA1C 6.9 (H) 05/22/2021  -Current medications: Pioglitazone 3025m 1 tablet daily  Victoza 1.2mg87mily - not currently taking  Repaglinide 1mg 43m tablets at breakfast and 1 tablet with dinner Metformin 500mg 79mtablet twice daily  -Medications previously tried: acarbose, invokana, humalog, lantus,   -Current home glucose readings fasting glucose: typically averages 140-200 (if she has eaten something sweet in the PM -Denies hypoglycemic/hyperglycemic symptoms -Current meal patterns:  breakfast: muffin, small amount of apple sauce with water  lunch: 1 piece of bread with peanutbutter, piece of fruit, water   dinner: chicken / soup / coleslaw, pizza on occasion snacks: does not typically snack, if she does - reports to eating animal crackers  drinks: water, on rare occasion may have unsweet tea  -Current exercise: none at this time  -Educated on A1c and blood sugar goals; Complications of diabetes including kidney damage, retinal damage, and cardiovascular disease; Exercise goal of 150 minutes per week; Benefits of weight loss; Benefits of routine self-monitoring of blood sugar; Carbohydrate counting and/or plate method -Counseled to check feet daily and get yearly eye exams -Counseled on diet and exercise extensively Recommended continue current medication regimen at this time, patient is to restart victoza tomorrow  Osteoporosis (Goal prevention of disease progression / fractures) -Controlled -Last DEXA Scan: 06/07/2020   T-Score left femoral neck: -1.7  T-Score right femoral neck: -1.8  T-Score lumbar spine: -2.4 -Current treatment  Calcium- Vitamin D 500mg-21mits - 1 tablet daily - on hold at this time Vitamin D 2000 units daily - on hold at this time  Prolia injections -  every 6 months  -Medications previously tried: alendronate  -Recommend  weight-bearing and muscle strengthening exercises for building and maintaining bone density. -Counseled on diet and exercise extensively Recommended continue with vitamin D and calcium hold, discuss restart with next PCP appointment in 2 weeks   Depression/Anxiety (Goal: Promotion of positive mood / prevention of anxiety attacks) - Follows with Dr. Toy Care for management -Controlled -Current treatment: Trintellix 76m - 1 tablt daily  Buspirone 153m- 1 tablet twice daily - not currently taking - plan is to discontinue if no issues with current hold Lorazepam 71m54m 1 tablet 4 times daily as needed - taking 1/2 tablet at breakfast and lunch and 1 tablet at bedtime -Medications previously tried/failed: rexulti, bupropion, desvenlafaxine, lithium, viibryd, depakote, topiramate  -Connected with Dr. KauToy Carer mental health support -Educated on Benefits of medication for symptom control Benefits of cognitive-behavioral therapy with or without medication -Recommended to continue current medication   Chronic Kidney Disease (Goal: Prevention of disease progression) -Controlled - stable  -Last CrCl: 28.2 mL/min -Last eGFR: 37.63 mL/min  -Last SCR: 1.34 mg/dL -Current treatment  N/a - avoidance of nephrotoxic agents to prevent kidney damage -Medications previously tried: n/a  -Recommended to continue current medication   GERD (Goal: prevention of acid reflux / abdominal pain) -Not ideally controlled - improved from last PCP visit  -Current treatment  Pantoprazole 87m66m1 tablet twice daily  Famotidine 20mg32m tablet daily  Sucralfate 1g - 1 tablet 4 times daily  -Medications previously tried: dexilant, dicyclomine, lansoprazole, metoclopramide, zofran, ranitidine  -Counseled on diet and exercise extensively Recommended to continue current medication  Chronic Pain (Goal: Pain control) -Controlled -Current treatment  Tramadol 50mg 58mtablet every 12 hours as needed  APAP 325mg -68mablet  every 12 hours as needed  Salonpas pain relief patch  - 1 patch at bedtime  Voltaren 1% gel - applied 2-4 times daily prn -Medications previously tried: celecoxib, norco, ibu, meloxicam, oxycodone,   -Recommended to continue current medication   Health Maintenance -Vaccine gaps: n/a  -Current therapy:  Multivitamin - 1 tablet daily - not currently taking  Vitamin B complex - 1 tablet daily -not currently taking  Cetirizine 10mg - 71mblet daily  L-Methylfolate 15mg - 145mlet daily  Salonpas pain relief patch  - 1 patch at bedtime  Magnesium 500mg - 1 53mule daily - not currently taking  -Educated on Cost vs benefit of each product must be carefully weighed by individual consumer -Patient is satisfied with current therapy and denies issues -Recommended to continue current medication  Patient Goals/Self-Care Activities Patient will:  - take medications as prescribed as evidenced by patient report and record review check glucose at least once daily, document, and provide at future appointments target a minimum of 150 minutes of moderate intensity exercise weekly engage in dietary modifications by reducing carbohydrate intake, increasing protein and vegetable intake to prevent elevations in BG  Follow Up Plan: Telephone follow up appointment with care management team member scheduled for: 3 months The patient has been provided with contact information for the care management team and has been advised to call with any health related questions or concerns.      Consent to CCM Services: Ms. Staunton was [place] information about Chronic Care Management services including:  CCM service includes personalized support from designated clinical staff supervised by her physician, including individualized plan of care and coordination with other care providers 24/7 contact phone numbers for assistance for  urgent and routine care needs. Service will only be billed when office clinical staff spend 20  minutes or more in a month to coordinate care. Only one practitioner may furnish and bill the service in a calendar month. The patient may stop CCM services at any time (effective at the end of the month) by phone call to the office staff. The patient will be responsible for cost sharing (co-pay) of up to 20% of the service fee (after annual deductible is met).  Patient agreed to services and verbal consent obtained.   Patient verbalizes understanding of instructions provided today and agrees to view in [place].   Telephone follow up appointment with care management team member scheduled for: 3 months The patient has been provided with contact information for the care management team and has been advised to call with any health related questions or concerns.   Tomasa Blase, PharmD Clinical Pharmacist, [place]

## 2021-06-28 ENCOUNTER — Other Ambulatory Visit: Payer: Self-pay

## 2021-06-28 ENCOUNTER — Ambulatory Visit (INDEPENDENT_AMBULATORY_CARE_PROVIDER_SITE_OTHER)
Admission: RE | Admit: 2021-06-28 | Discharge: 2021-06-28 | Disposition: A | Discharge status: Home / Self Care | Payer: PPO | Source: Ambulatory Visit | Attending: Internal Medicine | Admitting: Internal Medicine

## 2021-06-28 DIAGNOSIS — R112 Nausea with vomiting, unspecified: Secondary | ICD-10-CM

## 2021-06-28 DIAGNOSIS — R1013 Epigastric pain: Secondary | ICD-10-CM | POA: Diagnosis not present

## 2021-06-28 DIAGNOSIS — R1110 Vomiting, unspecified: Secondary | ICD-10-CM | POA: Diagnosis not present

## 2021-06-28 MED ORDER — IOHEXOL 350 MG/ML SOLN
80.0000 mL | Freq: Once | INTRAVENOUS | Status: AC | PRN
  Administered 2021-06-28: 80 mL via INTRAVENOUS

## 2021-07-11 ENCOUNTER — Encounter: Payer: Self-pay | Admitting: Internal Medicine

## 2021-07-11 ENCOUNTER — Other Ambulatory Visit: Payer: Self-pay

## 2021-07-11 ENCOUNTER — Ambulatory Visit (INDEPENDENT_AMBULATORY_CARE_PROVIDER_SITE_OTHER): Payer: PPO | Admitting: Internal Medicine

## 2021-07-11 DIAGNOSIS — K5900 Constipation, unspecified: Secondary | ICD-10-CM

## 2021-07-11 DIAGNOSIS — K219 Gastro-esophageal reflux disease without esophagitis: Secondary | ICD-10-CM

## 2021-07-11 DIAGNOSIS — E6609 Other obesity due to excess calories: Secondary | ICD-10-CM

## 2021-07-11 DIAGNOSIS — E118 Type 2 diabetes mellitus with unspecified complications: Secondary | ICD-10-CM | POA: Diagnosis not present

## 2021-07-11 DIAGNOSIS — I10 Essential (primary) hypertension: Secondary | ICD-10-CM

## 2021-07-11 DIAGNOSIS — K589 Irritable bowel syndrome without diarrhea: Secondary | ICD-10-CM | POA: Diagnosis not present

## 2021-07-11 DIAGNOSIS — R1013 Epigastric pain: Secondary | ICD-10-CM

## 2021-07-11 DIAGNOSIS — Z6830 Body mass index (BMI) 30.0-30.9, adult: Secondary | ICD-10-CM

## 2021-07-11 MED ORDER — SUCRALFATE 1 GM/10ML PO SUSP: 1.0000 g | Freq: Three times a day (TID) | ORAL | 5 refills | Status: DC

## 2021-07-11 MED ORDER — LACTULOSE 10 GM/15ML PO SOLN: 10.0000 g | Freq: Two times a day (BID) | ORAL | 3 refills | Status: DC | PRN

## 2021-07-11 MED ORDER — SUCRALFATE 1 G PO TABS: 1.0000 g | ORAL_TABLET | Freq: Three times a day (TID) | ORAL | 0 refills | Status: DC

## 2021-07-11 NOTE — Assessment & Plan Note (Signed)
Abd CT w/constipation - OTC meds did not help Start Lactulose po

## 2021-07-11 NOTE — Assessment & Plan Note (Signed)
Abd CT w/constipation - OTC meds did not help Carafate - change to liquid form Start Lactulose po

## 2021-07-11 NOTE — Assessment & Plan Note (Signed)
Elevated BP today Check BP at home

## 2021-07-11 NOTE — Telephone Encounter (Signed)
PROLIA GIVEN 05/29/2021 NEXT INJECTION 11/28/2021

## 2021-07-11 NOTE — Assessment & Plan Note (Signed)
Not better Start Lactulose prn

## 2021-07-11 NOTE — Assessment & Plan Note (Addendum)
D/c Carafate

## 2021-07-11 NOTE — Progress Notes (Signed)
Subjective:  Patient ID: [name], female    DOB: [birth year]  Age: 80 y.o. MRN: [id number]  CC: Follow-up (4 WEEK F/U)   HPI [name] presents for abd pain and constipation - OTC meds did not help F/u HTN, DM  Outpatient Medications Prior to Visit  Medication Sig Dispense Refill   acetaminophen (TYLENOL) 325 MG tablet Take 325 mg by mouth 2 (two) times daily as needed. Takes with tramadol     aspirin EC 81 MG tablet Take 1 tablet (81 mg total) by mouth daily. 100 tablet 3   b complex vitamins tablet Take 1 tablet by mouth daily. Vitamin supplement     BD PEN NEEDLE NANO U/F 32G X 4 MM MISC USE AS DIRECTED TWICE DAILY. (Patient taking differently: USE AS DIRECTED DAILY.) 200 each 3   Blood Glucose Monitoring Suppl ([place]) w/Device KIT Use as instructed to check blood sugar 5 times daily. 1 kit 0   cetirizine (ZYRTEC) 10 MG tablet Take 10 mg by mouth daily as needed for allergies.     denosumab (PROLIA) 60 MG/ML SOSY injection Inject 60 mg into the skin every 6 (six) months.     estradiol (CLIMARA - DOSED IN MG/24 HR) 0.025 mg/24hr patch Place 1 patch (0.025 mg total) onto the skin once a week. APPLY 1 PATCH ONTO SKIN ONCE WEEKLY (Patient taking differently: Place 0.0013 mg onto the skin once a week. APPLY 1/2 PATCH ONTO SKIN ONCE WEEKLY) 4 patch 0   famotidine (PEPCID) 20 MG tablet TAKE 1 TABLET BY MOUTH EVERYDAY AT BEDTIME 90 tablet 4   L-Methylfolate (DEPLIN) 15 MG TABS Take 15 mg by mouth daily. For treatment of depression     Lancet Devices (ONE TOUCH DELICA LANCING DEV) MISC Use daily as directed 1 each 0   Liniments (SALONPAS PAIN RELIEF PATCH EX) Apply 1 patch topically at bedtime.     LORazepam (ATIVAN) 1 MG tablet Take 1 mg by mouth 4 (four) times daily as needed for anxiety. Reports to taking 1/2 tablet at breakfast and lunch and 1 tablet at bedtime  2   metFORMIN (GLUCOPHAGE) 500 MG tablet Take 1 tablet by mouth in the morning and 1 tablet in the  evening. 180 tablet 1   Multiple Vitamins-Minerals (MULTIVITAMIN,TX-MINERALS) tablet Take 1 tablet by mouth daily.     ondansetron (ZOFRAN) 4 MG tablet TAKE 1 TABLET BY MOUTH EVERY MORNING AND 1 IN THE EVENING AS NEEDED. 180 tablet 3   ONETOUCH DELICA LANCETS 78I MISC USE UP TO 4 TIMES A DAY 200 each 3   ONETOUCH VERIO test strip USE TO CHECK BLOOD SUGARS 5 TIMES DAILY. 450 each 3   pantoprazole (PROTONIX) 40 MG tablet TAKE 1 TABLET BY MOUTH TWICE A DAY (Patient taking differently: Take 40 mg by mouth daily.) 180 tablet 4   pioglitazone (ACTOS) 30 MG tablet TAKE 1 TABLET BY MOUTH EVERY DAY 90 tablet 1   repaglinide (PRANDIN) 1 MG tablet TAKE 2 TABLETS BY MOUTH BEFORE BREAKFAST AND 1 TABLET AT DINNER (Patient taking differently: TAKE 2 TABLETS BY MOUTH BEFORE BREAKFAST, 1 tablet before lunch  AND 1 TABLET AT DINNER) 270 tablet 1   SUMAtriptan (IMITREX) 100 MG tablet Take 1 tablet (100 mg total) by mouth every 2 (two) hours as needed. Max twice a day. 10 tablet 11   traMADol (ULTRAM) 50 MG tablet TAKE 1 TABLET (50 MG TOTAL) BY MOUTH EVERY 12 (TWELVE) HOURS AS NEEDED FOR SEVERE  PAIN. 180 tablet 3   TRINTELLIX 20 MG TABS Take 20 mg by mouth daily.  2   VOLTAREN 1 % GEL APPLY 2 GRAMS TO KNEE 2-3 TIMES DAILY PRN  1   sucralfate (CARAFATE) 1 g tablet Take 1 tablet (1 g total) by mouth 4 (four) times daily -  with meals and at bedtime. 120 tablet 3   busPIRone (BUSPAR) 15 MG tablet Take 1 tablet by mouth 2 (two) times daily. (Patient not taking: No sig reported)     calcium-vitamin D (OSCAL WITH D) 500-200 MG-UNIT per tablet Take 1 tablet by mouth daily with breakfast. (Patient not taking: No sig reported)     cholecalciferol (VITAMIN D) 1000 units tablet Take 2,000 Units by mouth daily. (Patient not taking: No sig reported)     dicyclomine (BENTYL) 10 MG capsule TAKE 1 CAPSULE (10 MG TOTAL) BY MOUTH 2 (TWO) TIMES DAILY AS NEEDED FOR SPASMS. (Patient not taking: No sig reported) 180 capsule 1   lovastatin  (MEVACOR) 20 MG tablet Take 1 tablet (20 mg total) by mouth at bedtime. (Patient not taking: No sig reported) 90 tablet 3   Magnesium 500 MG CAPS Take 1 capsule by mouth daily. (Patient not taking: No sig reported)     VICTOZA 18 MG/3ML SOPN INJECT 1.2MG INTO MUSCLE DAILY (Patient not taking: No sig reported) 18 mL 2   No facility-administered medications prior to visit.    ROS: Review of Systems  Constitutional:  Positive for fatigue. Negative for activity change, appetite change, chills and unexpected weight change.  HENT:  Negative for congestion, mouth sores and sinus pressure.   Eyes:  Negative for visual disturbance.  Respiratory:  Negative for cough and chest tightness.   Gastrointestinal:  Positive for abdominal distention and abdominal pain. Negative for nausea.  Genitourinary:  Negative for difficulty urinating, frequency and vaginal pain.  Musculoskeletal:  Negative for back pain and gait problem.  Skin:  Negative for pallor and rash.  Neurological:  Negative for dizziness, tremors, weakness, numbness and headaches.  Psychiatric/Behavioral:  Positive for dysphoric mood. Negative for confusion and sleep disturbance. The patient is nervous/anxious.    Objective:  BP (!) 162/98 (BP Location: Left Arm)   Pulse 92   Temp 98.2 F (36.8 C) (Oral)   Ht '5\' 1"'  (1.549 m)   Wt 129 lb 12.8 oz (58.9 kg)   SpO2 98%   BMI 24.53 kg/m   BP Readings from Last 3 Encounters:  07/11/21 (!) 162/98  06/26/21 122/78  06/21/21 121/72    Wt Readings from Last 3 Encounters:  07/11/21 129 lb 12.8 oz (58.9 kg)  06/26/21 131 lb (59.4 kg)  06/21/21 133 lb 6.4 oz (60.5 kg)    Physical Exam Constitutional:      General: She is not in acute distress.    Appearance: Normal appearance. She is well-developed.  HENT:     Head: Normocephalic.     Right Ear: External ear normal.     Left Ear: External ear normal.     Nose: Nose normal.  Eyes:     General:        Right eye: No discharge.         Left eye: No discharge.     Conjunctiva/sclera: Conjunctivae normal.     Pupils: Pupils are equal, round, and reactive to light.  Neck:     Thyroid: No thyromegaly.     Vascular: No JVD.     Trachea: No tracheal deviation.  Cardiovascular:     Rate and Rhythm: Normal rate and regular rhythm.     Heart sounds: Normal heart sounds.  Pulmonary:     Effort: No respiratory distress.     Breath sounds: No stridor. No wheezing.  Abdominal:     General: Bowel sounds are normal. There is no distension.     Palpations: Abdomen is soft. There is no mass.     Tenderness: There is no abdominal tenderness. There is no guarding or rebound.  Musculoskeletal:        General: No tenderness.     Cervical back: Normal range of motion and neck supple. No rigidity.  Lymphadenopathy:     Cervical: No cervical adenopathy.  Skin:    Findings: No erythema or rash.  Neurological:     Cranial Nerves: No cranial nerve deficit.     Motor: No abnormal muscle tone.     Coordination: Coordination normal.     Deep Tendon Reflexes: Reflexes normal.  Psychiatric:        Behavior: Behavior normal.        Thought Content: Thought content normal.        Judgment: Judgment normal.    Lab Results  Component Value Date   WBC 6.9 06/21/2021   HGB 11.6 (L) 06/21/2021   HCT 35.6 (L) 06/21/2021   PLT 400.0 06/21/2021   GLUCOSE 211 (H) 06/21/2021   CHOL 116 05/10/2020   TRIG 53.0 05/10/2020   HDL 71.30 05/10/2020   LDLCALC 34 05/10/2020   ALT 15 06/21/2021   AST 24 06/21/2021   NA 134 (L) 06/21/2021   K 4.5 06/21/2021   CL 97 06/21/2021   CREATININE 1.34 (H) 06/21/2021   BUN 17 06/21/2021   CO2 31 06/21/2021   TSH 1.410 05/17/2020   INR 0.93 11/02/2013   HGBA1C 6.9 (H) 05/22/2021   MICROALBUR 1.4 09/18/2020    CT Abdomen Pelvis W Contrast  Result Date: 06/28/2021 CLINICAL DATA:  Epigastric pain.  Nausea and vomiting. EXAM: CT ABDOMEN AND PELVIS WITH CONTRAST TECHNIQUE: Multidetector CT imaging of  the abdomen and pelvis was performed using the standard protocol following bolus administration of intravenous contrast. CONTRAST:  59m OMNIPAQUE IOHEXOL 350 MG/ML SOLN COMPARISON:  09/01/2018 FINDINGS: Lower chest: Few tiny nodular densities in the right lower lobe. 3 mm nodule in the right lower lobe on sequence 3 image 4 is stable since 2020. No pleural effusions. Hepatobiliary: Cholecystectomy. Normal appearance of the liver. No suspicious liver lesion. No significant biliary dilatation. Portal venous system is patent. Pancreas: Pancreas is poorly characterized and likely related to fatty infiltration. No evidence for duct dilatation or inflammation. 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 Adrenals/Urinary Tract: Normal appearance of the adrenal glands. Normal appearance of the urinary bladder. Fullness in left renal pelvis could represent an extrarenal pelvis. Negative for hydronephrosis. No suspicious renal lesion. Stomach/Bowel: Small hiatal hernia. Moderate to large amount of stool throughout the colon. Oral contrast throughout the colon. No evidence for an obstruction. Normal appendix. Vascular/Lymphatic: Aortic atherosclerosis. No enlarged abdominal or pelvic lymph nodes. Reproductive: Status post hysterectomy. No adnexal masses. Soft tissue fullness in the vaginal region. Other: Negative for free fluid. Negative for free air. Tiny umbilical hernia containing fat. Musculoskeletal: Skin or superficial soft tissue thickening / edema in the left lower abdomen. Findings are nonspecific. Degenerative changes at the symphysis pubis. Surgical fixation of the right SI joint. Severe disc space narrowing with endplate changes at LG1-W2 Mild anterolisthesis at L5-S1. IMPRESSION: 1. No acute abnormality  in the abdomen or pelvis. 2. Moderate to large amount of stool in the colon. No evidence for a bowel obstruction. 3. Small hiatal hernia. Electronically Signed   By: Markus Daft M.D.   On: 06/28/2021 16:59    Assessment & Plan:    Problem List Items Addressed This Visit     DM type 2, controlled, with complication ([place])    Abd CT w/constipation - OTC meds did not help Carafate - change to liquid form Start Lactulose po      Epigastric pain    Abd CT w/constipation - OTC meds did not help Start Lactulose po      GERD    Start liq      Relevant Medications   lactulose (CHRONULAC) 10 GM/15ML solution   Irritable bowel syndrome    Abd CT w/constipation - OTC meds did not help Start Lactulose po      Relevant Medications   lactulose (CHRONULAC) 10 GM/15ML solution   Obesity    Pt lost a couple LBS on diet         Meds ordered this encounter  Medications   lactulose (CHRONULAC) 10 GM/15ML solution    Sig: Take 15-45 mLs (10-30 g total) by mouth 2 (two) times daily as needed for moderate constipation or severe constipation.    Dispense:  496 mL    Refill:  3   DISCONTD: sucralfate (CARAFATE) 1 GM/10ML suspension    Sig: Take 10 mLs (1 g total) by mouth 4 (four) times daily -  with meals and at bedtime.    Dispense:  420 mL    Refill:  5   DISCONTD: sucralfate (CARAFATE) 1 g tablet    Sig: Take 1 tablet (1 g total) by mouth 4 (four) times daily -  with meals and at bedtime.    Dispense:  1 tablet    Refill:  0    Pls disregard Carafate - do not fill - sorry!      Follow-up: No follow-ups on file.  Walker Kehr, MD

## 2021-07-11 NOTE — Assessment & Plan Note (Signed)
Pt lost a couple LBS on diet

## 2021-07-13 ENCOUNTER — Encounter: Payer: Self-pay | Admitting: Internal Medicine

## 2021-07-22 ENCOUNTER — Other Ambulatory Visit: Payer: Self-pay | Admitting: Endocrinology

## 2021-07-25 DIAGNOSIS — E7800 Pure hypercholesterolemia, unspecified: Secondary | ICD-10-CM | POA: Diagnosis not present

## 2021-07-25 DIAGNOSIS — E1122 Type 2 diabetes mellitus with diabetic chronic kidney disease: Secondary | ICD-10-CM

## 2021-07-25 DIAGNOSIS — N183 Chronic kidney disease, stage 3 unspecified: Secondary | ICD-10-CM | POA: Diagnosis not present

## 2021-07-25 DIAGNOSIS — E785 Hyperlipidemia, unspecified: Secondary | ICD-10-CM | POA: Diagnosis not present

## 2021-07-25 DIAGNOSIS — Z7984 Long term (current) use of oral hypoglycemic drugs: Secondary | ICD-10-CM | POA: Diagnosis not present

## 2021-07-29 ENCOUNTER — Other Ambulatory Visit: Payer: Self-pay | Admitting: Endocrinology

## 2021-08-10 DIAGNOSIS — G4733 Obstructive sleep apnea (adult) (pediatric): Secondary | ICD-10-CM | POA: Diagnosis not present

## 2021-08-29 ENCOUNTER — Telehealth: Payer: Self-pay | Admitting: Endocrinology

## 2021-08-29 ENCOUNTER — Other Ambulatory Visit: Payer: Self-pay | Admitting: Endocrinology

## 2021-08-29 DIAGNOSIS — E118 Type 2 diabetes mellitus with unspecified complications: Secondary | ICD-10-CM

## 2021-08-29 NOTE — Telephone Encounter (Signed)
MEDICATION: repaglinide (PRANDIN) 1 MG tablet  PHARMACY:   [id number] [address], [place] - [address] Phone:  [phone]  Fax:  [phone]      HAS THE PATIENT CONTACTED THEIR PHARMACY?  YES  IS THIS A 90 DAY SUPPLY : YES  IS PATIENT OUT OF MEDICATION: 5 DAYS LEFT  IF NOT; HOW MUCH IS LEFT:   LAST APPOINTMENT DATE: @1 /11/2021  NEXT APPOINTMENT DATE:@1 /30/2023  DO WE HAVE YOUR PERMISSION TO LEAVE A DETAILED MESSAGE?:  OTHER COMMENTS: Provider instructed patient to increase dosage by 1 pill at lunch time so patient will be out of medication in 5 days. Please forward new prescription to pharmacy with increase and correct dosage.   **Let patient know to contact pharmacy at the end of the day to make sure medication is ready. **  ** Please notify patient to allow 48-72 hours to process**  **Encourage patient to contact the pharmacy for refills or they can request refills through [facility]**

## 2021-08-29 NOTE — Telephone Encounter (Signed)
Rx sent in another encounter

## 2021-08-30 ENCOUNTER — Ambulatory Visit: Payer: PPO | Admitting: Endocrinology

## 2021-09-11 ENCOUNTER — Ambulatory Visit: Payer: PPO | Admitting: Neurology

## 2021-09-13 ENCOUNTER — Encounter: Payer: Self-pay | Admitting: Internal Medicine

## 2021-09-13 ENCOUNTER — Other Ambulatory Visit: Payer: Self-pay

## 2021-09-13 ENCOUNTER — Ambulatory Visit (INDEPENDENT_AMBULATORY_CARE_PROVIDER_SITE_OTHER): Payer: PPO | Admitting: Internal Medicine

## 2021-09-13 DIAGNOSIS — R1013 Epigastric pain: Secondary | ICD-10-CM | POA: Diagnosis not present

## 2021-09-13 DIAGNOSIS — R413 Other amnesia: Secondary | ICD-10-CM | POA: Diagnosis not present

## 2021-09-13 DIAGNOSIS — K5900 Constipation, unspecified: Secondary | ICD-10-CM

## 2021-09-13 DIAGNOSIS — E118 Type 2 diabetes mellitus with unspecified complications: Secondary | ICD-10-CM

## 2021-09-13 DIAGNOSIS — F3341 Major depressive disorder, recurrent, in partial remission: Secondary | ICD-10-CM | POA: Diagnosis not present

## 2021-09-13 DIAGNOSIS — F411 Generalized anxiety disorder: Secondary | ICD-10-CM

## 2021-09-13 MED ORDER — SENNOSIDES-DOCUSATE SODIUM 8.6-50 MG PO TABS: 2.0000 | ORAL_TABLET | Freq: Two times a day (BID) | ORAL | 6 refills | Status: DC

## 2021-09-13 NOTE — Progress Notes (Signed)
Subjective:  Patient ID: [name], female    DOB: 1941-02-04  Age: 81 y.o. MRN: [id number]  CC: No chief complaint on file.   HPI [name] presents for abd pain, constipation Lactulose is not working well -[name] is complaining of bloating and diarrhea Follow-up on anxiety and depression.  Abdominal pain is better overall  Outpatient Medications Prior to Visit  Medication Sig Dispense Refill   acetaminophen (TYLENOL) 325 MG tablet Take 325 mg by mouth 2 (two) times daily as needed. Takes with tramadol     aspirin EC 81 MG tablet Take 1 tablet (81 mg total) by mouth daily. 100 tablet 3   BD PEN NEEDLE NANO U/F 32G X 4 MM MISC USE AS DIRECTED TWICE DAILY. (Patient taking differently: USE AS DIRECTED DAILY.) 200 each 3   Blood Glucose Monitoring Suppl ([place]) w/Device KIT Use as instructed to check blood sugar 5 times daily. 1 kit 0   busPIRone (BUSPAR) 15 MG tablet Take 1 tablet by mouth 2 (two) times daily.     cetirizine (ZYRTEC) 10 MG tablet Take 10 mg by mouth daily as needed for allergies.     denosumab (PROLIA) 60 MG/ML SOSY injection Inject 60 mg into the skin every 6 (six) months.     famotidine (PEPCID) 20 MG tablet TAKE 1 TABLET BY MOUTH EVERYDAY AT BEDTIME 90 tablet 4   L-Methylfolate (DEPLIN) 15 MG TABS Take 15 mg by mouth daily. For treatment of depression     lactulose (CHRONULAC) 10 GM/15ML solution Take 15-45 mLs (10-30 g total) by mouth 2 (two) times daily as needed for moderate constipation or severe constipation. 496 mL 3   Lancet Devices (ONE TOUCH DELICA LANCING DEV) MISC Use daily as directed 1 each 0   Liniments (SALONPAS PAIN RELIEF PATCH EX) Apply 1 patch topically at bedtime.     LORazepam (ATIVAN) 1 MG tablet Take 1 mg by mouth 4 (four) times daily as needed for anxiety. Reports to taking 1/2 tablet at breakfast and lunch and 1 tablet at bedtime  2   metFORMIN (GLUCOPHAGE) 500 MG tablet TAKE 1 TABLET BY MOUTH IN THE MORNING AND 1  TABLET IN THE EVENING. 180 tablet 1   ONETOUCH DELICA LANCETS 15V MISC USE UP TO 4 TIMES A DAY 200 each 3   ONETOUCH VERIO test strip USE TO CHECK BLOOD SUGARS 5 TIMES DAILY. 450 each 3   pantoprazole (PROTONIX) 40 MG tablet TAKE 1 TABLET BY MOUTH TWICE A DAY (Patient taking differently: Take 40 mg by mouth daily.) 180 tablet 4   pioglitazone (ACTOS) 30 MG tablet TAKE 1 TABLET BY MOUTH EVERY DAY 90 tablet 1   repaglinide (PRANDIN) 1 MG tablet TAKE 2 TABLETS BY MOUTH BEFORE BREAKFAST, 1 TABLET AT LUNCH AND 1 TABLET AT DINNER 360 tablet 1   SUMAtriptan (IMITREX) 100 MG tablet Take 1 tablet (100 mg total) by mouth every 2 (two) hours as needed. Max twice a day. 10 tablet 11   traMADol (ULTRAM) 50 MG tablet TAKE 1 TABLET (50 MG TOTAL) BY MOUTH EVERY 12 (TWELVE) HOURS AS NEEDED FOR SEVERE PAIN. 180 tablet 3   TRINTELLIX 20 MG TABS Take 10 mg by mouth daily. Take 10 mg daily  2   VICTOZA 18 MG/3ML SOPN INJECT 1.2MG INTO MUSCLE DAILY 18 mL 2   VOLTAREN 1 % GEL APPLY 2 GRAMS TO KNEE 2-3 TIMES DAILY PRN  1   b complex vitamins tablet Take 1  tablet by mouth daily. Vitamin supplement (Patient not taking: Reported on 09/13/2021)     calcium-vitamin D (OSCAL WITH D) 500-200 MG-UNIT per tablet Take 1 tablet by mouth daily with breakfast. (Patient not taking: Reported on 06/21/2021)     cholecalciferol (VITAMIN D) 1000 units tablet Take 2,000 Units by mouth daily. (Patient not taking: Reported on 06/21/2021)     dicyclomine (BENTYL) 10 MG capsule TAKE 1 CAPSULE (10 MG TOTAL) BY MOUTH 2 (TWO) TIMES DAILY AS NEEDED FOR SPASMS. (Patient not taking: Reported on 06/26/2021) 180 capsule 1   lovastatin (MEVACOR) 20 MG tablet Take 1 tablet (20 mg total) by mouth at bedtime. (Patient not taking: Reported on 06/26/2021) 90 tablet 3   Magnesium 500 MG CAPS Take 1 capsule by mouth daily. (Patient not taking: Reported on 06/21/2021)     Multiple Vitamins-Minerals (MULTIVITAMIN,TX-MINERALS) tablet Take 1 tablet by mouth daily.  (Patient not taking: Reported on 09/13/2021)     ondansetron (ZOFRAN) 4 MG tablet TAKE 1 TABLET BY MOUTH EVERY MORNING AND 1 IN THE EVENING AS NEEDED. (Patient not taking: Reported on 09/13/2021) 180 tablet 3   estradiol (CLIMARA - DOSED IN MG/24 HR) 0.025 mg/24hr patch Place 1 patch (0.025 mg total) onto the skin once a week. APPLY 1 PATCH ONTO SKIN ONCE WEEKLY (Patient not taking: Reported on 09/13/2021) 4 patch 0   No facility-administered medications prior to visit.    ROS: Review of Systems  Constitutional:  Positive for fatigue. Negative for activity change, appetite change, chills and unexpected weight change.  HENT:  Negative for congestion, mouth sores and sinus pressure.   Eyes:  Negative for visual disturbance.  Respiratory:  Negative for cough and chest tightness.   Gastrointestinal:  Positive for abdominal distention, constipation and diarrhea. Negative for abdominal pain and nausea.  Genitourinary:  Negative for difficulty urinating, frequency and vaginal pain.  Musculoskeletal:  Positive for back pain. Negative for gait problem.  Skin:  Negative for pallor and rash.  Neurological:  Negative for dizziness, tremors, weakness, numbness and headaches.  Psychiatric/Behavioral:  Positive for decreased concentration, dysphoric mood and suicidal ideas. Negative for confusion and sleep disturbance. The patient is nervous/anxious.    Objective:  BP 138/88 (BP Location: Left Arm, Patient Position: Sitting, Cuff Size: Large)    Pulse 95    Temp 98.2 F (36.8 C) (Oral)    Ht _0  (1.549 m)    Wt 128 lb (58.1 kg)    SpO2 98%    BMI 24.19 kg/m   BP Readings from Last 3 Encounters:  09/13/21 138/88  07/11/21 (!) 162/98  06/26/21 122/78    Wt Readings from Last 3 Encounters:  09/13/21 128 lb (58.1 kg)  07/11/21 129 lb 12.8 oz (58.9 kg)  06/26/21 131 lb (59.4 kg)    Physical Exam Constitutional:      General: She is not in acute distress.    Appearance: She is well-developed.   HENT:     Head: Normocephalic.     Right Ear: External ear normal.     Left Ear: External ear normal.     Nose: Nose normal.  Eyes:     General:        Right eye: No discharge.        Left eye: No discharge.     Conjunctiva/sclera: Conjunctivae normal.     Pupils: Pupils are equal, round, and reactive to light.  Neck:     Thyroid: No thyromegaly.     Vascular: No  JVD.     Trachea: No tracheal deviation.  Cardiovascular:     Rate and Rhythm: Normal rate and regular rhythm.     Heart sounds: Normal heart sounds.  Pulmonary:     Effort: No respiratory distress.     Breath sounds: No stridor. No wheezing.  Abdominal:     General: Bowel sounds are normal. There is no distension.     Palpations: Abdomen is soft. There is no mass.     Tenderness: There is no abdominal tenderness. There is no guarding or rebound.  Musculoskeletal:        General: No tenderness.     Cervical back: Normal range of motion and neck supple. No rigidity.  Lymphadenopathy:     Cervical: No cervical adenopathy.  Skin:    Findings: No erythema or rash.  Neurological:     Mental Status: She is oriented to person, place, and time.     Cranial Nerves: No cranial nerve deficit.     Motor: No abnormal muscle tone.     Coordination: Coordination normal.     Gait: Gait normal.     Deep Tendon Reflexes: Reflexes normal.  Psychiatric:        Behavior: Behavior normal.        Thought Content: Thought content normal.        Judgment: Judgment normal.    Lab Results  Component Value Date   WBC 6.9 06/21/2021   HGB 11.6 (L) 06/21/2021   HCT 35.6 (L) 06/21/2021   PLT 400.0 06/21/2021   GLUCOSE 211 (H) 06/21/2021   CHOL 116 05/10/2020   TRIG 53.0 05/10/2020   HDL 71.30 05/10/2020   LDLCALC 34 05/10/2020   ALT 15 06/21/2021   AST 24 06/21/2021   NA 134 (L) 06/21/2021   K 4.5 06/21/2021   CL 97 06/21/2021   CREATININE 1.34 (H) 06/21/2021   BUN 17 06/21/2021   CO2 31 06/21/2021   TSH 1.410 05/17/2020    INR 0.93 11/02/2013   HGBA1C 6.9 (H) 05/22/2021   MICROALBUR 1.4 09/18/2020    CT Abdomen Pelvis W Contrast  Result Date: 06/28/2021 CLINICAL DATA:  Epigastric pain.  Nausea and vomiting. EXAM: CT ABDOMEN AND PELVIS WITH CONTRAST TECHNIQUE: Multidetector CT imaging of the abdomen and pelvis was performed using the standard protocol following bolus administration of intravenous contrast. CONTRAST:  23m OMNIPAQUE IOHEXOL 350 MG/ML SOLN COMPARISON:  09/01/2018 FINDINGS: Lower chest: Few tiny nodular densities in the right lower lobe. 3 mm nodule in the right lower lobe on sequence 3 image 4 is stable since 2020. No pleural effusions. Hepatobiliary: Cholecystectomy. Normal appearance of the liver. No suspicious liver lesion. No significant biliary dilatation. Portal venous system is patent. Pancreas: Pancreas is poorly characterized and likely related to fatty infiltration. No evidence for duct dilatation or inflammation. 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 Adrenals/Urinary Tract: Normal appearance of the adrenal glands. Normal appearance of the urinary bladder. Fullness in left renal pelvis could represent an extrarenal pelvis. Negative for hydronephrosis. No suspicious renal lesion. Stomach/Bowel: Small hiatal hernia. Moderate to large amount of stool throughout the colon. Oral contrast throughout the colon. No evidence for an obstruction. Normal appendix. Vascular/Lymphatic: Aortic atherosclerosis. No enlarged abdominal or pelvic lymph nodes. Reproductive: Status post hysterectomy. No adnexal masses. Soft tissue fullness in the vaginal region. Other: Negative for free fluid. Negative for free air. Tiny umbilical hernia containing fat. Musculoskeletal: Skin or superficial soft tissue thickening / edema in the left lower abdomen. Findings are nonspecific.  Degenerative changes at the symphysis pubis. Surgical fixation of the right SI joint. Severe disc space narrowing with endplate changes at V3-K1. Mild  anterolisthesis at L5-S1. IMPRESSION: 1. No acute abnormality in the abdomen or pelvis. 2. Moderate to large amount of stool in the colon. No evidence for a bowel obstruction. 3. Small hiatal hernia. Electronically Signed   By: Markus Daft M.D.   On: 06/28/2021 16:59    Assessment & Plan:   Problem List Items Addressed This Visit     Anxiety state    F/u Dr Toy Care On prn Xanax  Potential benefits of a long term benzodiazepines  use as well as potential risks  and complications were explained to the patient and were aknowledged.      Constipation    Discontinue lactulose due to side effects. Start Senokot S: 2 tablets twice a day      Depression    On Trintellix, Buspar       DM type 2, controlled, with complication ([place])    Abd CT w/constipation - OTC meds did not help Carafate - change to liquid form Start Lactulose po      Epigastric pain    Getting better with constipation treatment      Memory problem    Mild         Meds ordered this encounter  Medications   senna-docusate (SENOKOT-S) 8.6-50 MG tablet    Sig: Take 2 tablets by mouth 2 (two) times daily.    Dispense:  100 tablet    Refill:  6      Follow-up: Return in about 6 weeks (around 10/25/2021) for a follow-up visit.  Walker Kehr, MD

## 2021-09-13 NOTE — Assessment & Plan Note (Signed)
Abd CT w/constipation - OTC meds did not help Carafate - change to liquid form Start Lactulose po

## 2021-09-13 NOTE — Assessment & Plan Note (Signed)
F/u Dr Toy Care On prn Xanax  Potential benefits of a long term benzodiazepines  use as well as potential risks  and complications were explained to the patient and were aknowledged.

## 2021-09-13 NOTE — Assessment & Plan Note (Signed)
On Trintellix, Buspar

## 2021-09-13 NOTE — Assessment & Plan Note (Signed)
Mild.

## 2021-09-18 ENCOUNTER — Ambulatory Visit: Payer: PPO | Admitting: Neurology

## 2021-09-18 NOTE — Assessment & Plan Note (Signed)
Discontinue lactulose due to side effects. Start Senokot S: 2 tablets twice a day

## 2021-09-18 NOTE — Assessment & Plan Note (Signed)
Getting better with constipation treatment

## 2021-09-24 ENCOUNTER — Other Ambulatory Visit: Payer: Self-pay

## 2021-09-24 ENCOUNTER — Telehealth: Payer: Self-pay | Admitting: Adult Health

## 2021-09-24 ENCOUNTER — Ambulatory Visit: Payer: PPO | Admitting: Neurology

## 2021-09-24 ENCOUNTER — Other Ambulatory Visit (INDEPENDENT_AMBULATORY_CARE_PROVIDER_SITE_OTHER): Payer: PPO

## 2021-09-24 DIAGNOSIS — E1165 Type 2 diabetes mellitus with hyperglycemia: Secondary | ICD-10-CM

## 2021-09-24 LAB — BASIC METABOLIC PANEL
BUN: 14 mg/dL (ref 6–23)
CO2: 28 mEq/L (ref 19–32)
Calcium: 9.9 mg/dL (ref 8.4–10.5)
Chloride: 98 mEq/L (ref 96–112)
Creatinine, Ser: 1.13 mg/dL (ref 0.40–1.20)
GFR: 46.08 mL/min — ABNORMAL LOW (ref >60.00)
Glucose, Bld: 153 mg/dL — ABNORMAL HIGH (ref 70–99)
Potassium: 4.9 mEq/L (ref 3.5–5.1)
Sodium: 135 mEq/L (ref 135–145)

## 2021-09-24 LAB — HEMOGLOBIN A1C: Hgb A1c MFr Bld: 6.9 % — ABNORMAL HIGH (ref 4.6–6.5)

## 2021-09-24 NOTE — Telephone Encounter (Signed)
error 

## 2021-09-27 ENCOUNTER — Encounter: Payer: Self-pay | Admitting: Adult Health

## 2021-09-27 ENCOUNTER — Ambulatory Visit: Payer: PPO | Admitting: Endocrinology

## 2021-10-01 ENCOUNTER — Ambulatory Visit (INDEPENDENT_AMBULATORY_CARE_PROVIDER_SITE_OTHER): Payer: PPO | Admitting: Endocrinology

## 2021-10-01 ENCOUNTER — Encounter: Payer: Self-pay | Admitting: Endocrinology

## 2021-10-01 ENCOUNTER — Other Ambulatory Visit: Payer: Self-pay

## 2021-10-01 VITALS — BP 126/78 | HR 97 | Ht 61.0 in | Wt 124.6 lb

## 2021-10-01 DIAGNOSIS — E7800 Pure hypercholesterolemia, unspecified: Secondary | ICD-10-CM | POA: Diagnosis not present

## 2021-10-01 DIAGNOSIS — E1165 Type 2 diabetes mellitus with hyperglycemia: Secondary | ICD-10-CM | POA: Diagnosis not present

## 2021-10-01 MED ORDER — ONETOUCH VERIO VI STRP: ORAL_STRIP | 3 refills | Status: AC
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
## 2021-10-01 NOTE — Telephone Encounter (Signed)
Spoke with pt's husband and was able to schedule a sooner CPAP appt this Wed 2/8 @ 3:15 PM (arrival 15-30 minutes early) with Dr Rexene Alberts. He would like pt to have a new machine. Air is blowing everywhere. Machine is at least 81 years old. We will watch for an opening for botox. I spoke with him and he is aware insurance would not pay for an OV within 10 days after botox. He would like to prioritize the CPAP appt.

## 2021-10-01 NOTE — Patient Instructions (Signed)
Check blood sugars on waking up 2 days a week  Also check blood sugars about 2 hours after meals and do this after different meals by rotation  Recommended blood sugar levels on waking up are 90-130 and about 2 hours after meal is 130-180  Please bring your blood sugar monitor to each visit, thank you  

## 2021-10-03 ENCOUNTER — Encounter: Payer: Self-pay | Admitting: Neurology

## 2021-10-03 ENCOUNTER — Ambulatory Visit: Payer: PPO | Admitting: Neurology

## 2021-10-03 VITALS — BP 116/66 | HR 92 | Ht 62.0 in | Wt 125.6 lb

## 2021-10-03 DIAGNOSIS — Z9989 Dependence on other enabling machines and devices: Secondary | ICD-10-CM

## 2021-10-03 DIAGNOSIS — Z789 Other specified health status: Secondary | ICD-10-CM

## 2021-10-03 DIAGNOSIS — G4733 Obstructive sleep apnea (adult) (pediatric): Secondary | ICD-10-CM | POA: Diagnosis not present

## 2021-10-03 NOTE — Patient Instructions (Addendum)
It was nice to see you again today.   Here is what we discussed today:   Please make an appointment with [facility] to troubleshoot your machine, you will have to bring the entire machine with the power cord, and the mask and tubing. Address is [place] Ph number is: [facility] [phone].   You should be eligible to for a new CPAP machine. We should proceed with a sleep study to determine how severe your sleep apnea is.  I have ordered a home sleep test.  When you do the testing at home, please do not use your CPAP at night so we can get diagnostic data and confirm your sleep apnea diagnosis.  Please remember, the risks and ramifications of moderate to severe obstructive sleep apnea or OSA are: Cardiovascular disease, including congestive heart failure, stroke, difficult to control hypertension, arrhythmias, and even type 2 diabetes has been linked to untreated OSA. Sleep apnea causes disruption of sleep and sleep deprivation in most cases, which, in turn, can cause recurrent headaches, problems with memory, mood, concentration, focus, and vigilance. Most people with untreated sleep apnea report excessive daytime sleepiness, which can affect their ability to drive. Please do not drive if you feel sleepy.   I will likely see you back after your sleep study to go over the test results and where to go from there. We will call you after your sleep study to advise about the results (most likely, you will hear from Col[place], my nurse) and to set up an appointment at the time, as necessary.    Our sleep lab administrative assistant will call you to schedule your sleep test. If you don't hear back from her by about 2 weeks from now, please feel free to call her at [phone]. You can leave a message with your phone number and concerns, if you get the voicemail box. She will call back as soon as possible.
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
## 2021-10-04 ENCOUNTER — Telehealth: Payer: PPO

## 2021-10-04 NOTE — Progress Notes (Deleted)
Chronic Care Management Pharmacy Note  10/04/2021 Name:  [name] MRN:  [id number] DOB:  [birth year]  Summary: -Patient reports that since latest PCP OV where sucralfate was started and different medications were put on hold abdominal pain and acid reflux has improved.   -Patient is scheduled for CT scan of abdomen tomorrow and will follow up with PCP in 2 weeks to discuss results -Patient currently holding buspirone, denies any issues since stopping, plan with [facility] provider is to discontinue should she not have any issues with hold of buspirone -Patient reports at blood sugars typically are averaging ~140, can elevate in the evening depending on her diet  -LDL well controlled with last check - currently holding lovastatin (has only been 1 week) but would not expect short term hold to lead to elevation if LDL out of goal range -Pain controlled on tramadol, APAP, voltaren gel, and salonpas patches  -Patient continues on prolia injections for osteoporosis - latest dexa scan appears improved compared to previous scan - continue current regimen   Recommendations/Changes made from today's visit: -Recommending no changes at this time - patient is scheduled to restart vicotza tomorrow, as she has only been off for 1 week, will restart at previous 1.61m daily dose -Patient to follow up with PCP to discuss results of CT scan tomorrow / determine medications that will be restarted  -Recommend continued monitoring of kidney function, adjust medications should kidney function decline   Subjective: Cheryl SCRIPTERis an 81y.o. year old female who is a primary patient of Plotnikov, AEvie Lacks MD.  The CCM team was consulted for assistance with disease management and care coordination needs.    Engaged with patient by telephone for initial visit in response to provider referral for pharmacy case management and/or care coordination services.   Consent to Services:  The patient was given the following  information about Chronic Care Management services today, agreed to services, and gave verbal consent: 1. CCM service includes personalized support from designated clinical staff supervised by the primary care provider, including individualized plan of care and coordination with other care providers 2. 24/7 contact phone numbers for assistance for urgent and routine care needs. 3. Service will only be billed when office clinical staff spend 20 minutes or more in a month to coordinate care. 4. Only one practitioner may furnish and bill the service in a calendar month. 5.The patient may stop CCM services at any time (effective at the end of the month) by phone call to the office staff. 6. The patient will be responsible for cost sharing (co-pay) of up to 20% of the service fee (after annual deductible is met). Patient agreed to services and consent obtained.  Patient Care Team: Plotnikov, AEvie Lacks MD as PCP - General KChucky May MD (Psychiatry) KBerle Mull MD as Attending Physician (Family Medicine) Regal, NTamala Fothergill DPM as Consulting Physician (Podiatry) SLadene Artist MD as Consulting Physician (Gastroenterology) AMelvenia Beam MD as Consulting Physician (Neurology) KElayne Snare MD as Consulting Physician (Endocrinology) HMonna Fam MD as Consulting Physician (Ophthalmology) SDelice BisonDDarnelle Maffucci RALPine Surgic[facility] LLC Dba ALPine Surgery Centeras Pharmacist (Pharmacist)  Recent office visits:  09/13/2021 - Dr. PAlain Marion- abd pain, constipation - lactulose stopped due to AE - start senokot S BID  - follow up in 6 weeks  07/11/2021 - Dr. PAlain Marion- epigastric pain / constipation - start lactulose f/u in 6 weeks    Recent consult visits:  10/03/2021 - Dr. ARexene Alberts- Neurology - OSA - sleep test  ordered  10/01/2021 - Dr. Dwyane Dee - Endocrinology - increase to 2 tablets of prandin if she is planning to eat desert - follow up in 4 months    Hospital visits:  None in previous 6 months  Objective:  Lab Results  Component Value Date    CREATININE 1.13 09/24/2021   BUN 14 09/24/2021   GFR 46.08 (L) 09/24/2021   GFRNONAA 40 (L) 03/13/2018   GFRAA 46 (L) 03/13/2018   NA 135 09/24/2021   K 4.9 09/24/2021   CALCIUM 9.9 09/24/2021   CO2 28 09/24/2021   GLUCOSE 153 (H) 09/24/2021    Lab Results  Component Value Date/Time   HGBA1C 6.9 (H) 09/24/2021 10:44 AM   HGBA1C 6.9 (H) 05/22/2021 10:29 AM   FRUCTOSAMINE 286 (H) 09/29/2017 10:28 AM   GFR 46.08 (L) 09/24/2021 10:44 AM   GFR 37.63 (L) 06/21/2021 02:05 PM   MICROALBUR 1.4 09/18/2020 03:14 PM   MICROALBUR 1.4 09/09/2019 11:07 AM    Last diabetic Eye exam:  Lab Results  Component Value Date/Time   HMDIABEYEEXA No Retinopathy 04/12/2021 12:00 AM    Last diabetic Foot exam:  No results found for: HMDIABFOOTEX   Lab Results  Component Value Date   CHOL 116 05/10/2020   HDL 71.30 05/10/2020   LDLCALC 34 05/10/2020   TRIG 53.0 05/10/2020   CHOLHDL 2 05/10/2020    Hepatic Function Latest Ref Rng & Units 06/21/2021 05/10/2020 01/07/2020  Total Protein 6.0 - 8.3 g/dL 7.4 6.9 6.7  Albumin 3.5 - 5.2 g/dL 4.2 4.0 4.0  AST 0 - 37 U/L '24 24 22  ' ALT 0 - 35 U/L '15 15 13  ' Alk Phosphatase 39 - 117 U/L 48 48 52  Total Bilirubin 0.2 - 1.2 mg/dL 0.3 0.4 0.3  Bilirubin, Direct 0.0 - 0.3 mg/dL - - -    Lab Results  Component Value Date/Time   TSH 1.410 05/17/2020 02:46 PM   TSH 1.29 12/02/2017 11:57 AM    CBC Latest Ref Rng & Units 06/21/2021 03/13/2018 12/02/2017  WBC 4.0 - 10.5 K/uL 6.9 7.1 9.1  Hemoglobin 12.0 - 15.0 g/dL 11.6(L) 10.8(L) 11.4(L)  Hematocrit 36.0 - 46.0 % 35.6(L) 33.2(L) 34.1(L)  Platelets 150.0 - 400.0 K/uL 400.0 412 405.0(H)    Lab Results  Component Value Date/Time   VD25OH 53.1 06/11/2017 02:53 PM   VD25OH 55.3 03/14/2017 03:51 PM    Clinical ASCVD: No  The ASCVD Risk score (Arnett DK, et al., 2019) failed to calculate for the following reasons:   The 2019 ASCVD risk score is only valid for ages 74 to 54    Depression screen PHQ 2/9  07/11/2021 02/14/2021 02/07/2021  Decreased Interest - 0 0  Down, Depressed, Hopeless 0 0 0  PHQ - 2 Score 0 0 0  Altered sleeping 2 0 0  Tired, decreased energy 1 0 1  Change in appetite - 0 0  Feeling bad or failure about yourself  0 0 0  Trouble concentrating 0 0 0  Moving slowly or fidgety/restless 0 0 0  Suicidal thoughts 0 0 0  PHQ-9 Score 3 0 1  Difficult doing work/chores Not difficult at all Not difficult at all Not difficult at all  Some recent data might be hidden     Social History   Tobacco Use  Smoking Status Never  Smokeless Tobacco Never   BP Readings from Last 3 Encounters:  10/03/21 116/66  10/01/21 126/78  09/13/21 138/88   Pulse Readings from Last 3  Encounters:  10/03/21 92  10/01/21 97  09/13/21 95   Wt Readings from Last 3 Encounters:  10/03/21 125 lb 9.6 oz (57 kg)  10/01/21 124 lb 9.6 oz (56.5 kg)  09/13/21 128 lb (58.1 kg)   BMI Readings from Last 3 Encounters:  10/03/21 22.97 kg/m  10/01/21 23.54 kg/m  09/13/21 24.19 kg/m    Assessment/Interventions: Review of patient past medical history, allergies, medications, health status, including review of consultants reports, laboratory and other test data, was performed as part of comprehensive evaluation and provision of chronic care management services.   SDOH:  (Social Determinants of Health) assessments and interventions performed: Yes  SDOH Screenings   Alcohol Screen: Low Risk    Last Alcohol Screening Score (AUDIT): 0  Depression (PHQ2-9): Low Risk    PHQ-2 Score: 3  Financial Resource Strain: Low Risk    Difficulty of Paying Living Expenses: Not hard at all  Food Insecurity: No Food Insecurity   Worried About Charity fundraiser in the Last Year: Never true   Ran Out of Food in the Last Year: Never true  Housing: Low Risk    Last Housing Risk Score: 0  Physical Activity: Inactive   Days of Exercise per Week: 0 days   Minutes of Exercise per Session: 0 min  Social Connections:  Engineer, building services of Communication with Friends and Family: More than three times a week   Frequency of Social Gatherings with Friends and Family: Three times a week   Attends Religious Services: More than 4 times per year   Active Member of Clubs or Organizations: Yes   Attends Music therapist: More than 4 times per year   Marital Status: Married  Stress: No Stress Concern Present   Feeling of Stress : Not at all  Tobacco Use: Low Risk    Smoking Tobacco Use: Never   Smokeless Tobacco Use: Never   Passive Exposure: Not on file  Transportation Needs: No Transportation Needs   Lack of Transportation (Medical): No   Lack of Transportation (Non-Medical): No    CCM Care Plan  Allergies  Allergen Reactions   Amlodipine Other (See Comments)    Dizzy, headaches   Celebrex [Celecoxib] Other (See Comments)    headaches   Topamax [Topiramate] Other (See Comments)    hallucinations   Amitiza [Lubiprostone]     dizziness   Carafate [Sucralfate]     constipation   Lactulose     Side effects and inefficient   Codeine Nausea Only   Gabapentin Other (See Comments)    dizzy   Hydrocodone Nausea Only and Other (See Comments)    Feeling funny,    Meloxicam Other (See Comments)    jittery   Pravastatin Sodium Other (See Comments)    Muscle aches and pains    Medications Reviewed Today     Reviewed by Lucila Maine, CMA (Certified Medical Assistant) on 10/03/21 at [place] List Status: <None>   Medication Order Taking? Sig Documenting Provider Last Dose Status Informant  acetaminophen (TYLENOL) 325 MG tablet [id number] Yes Take 325 mg by mouth 2 (two) times daily as needed. Takes with tramadol [provider] Taking Active   aspirin EC 81 MG tablet [id number] Yes Take 1 tablet (81 mg total) by mouth daily. Plotnikov, Evie Lacks, MD Taking Active Spouse/Significant Other  b complex vitamins tablet [id number] No Take 1 tablet by mouth daily. Vitamin  supplement  Patient not taking: Reported on 10/03/2021  [provider] Not Taking Active   BD PEN NEEDLE NANO U/F 32G X 4 MM MISC [id number] Yes USE AS DIRECTED TWICE DAILY.  Patient taking differently: USE AS DIRECTED DAILY.   Elayne Snare, MD Taking Active   Blood Glucose Monitoring Suppl ([place]) w/Device KIT [id number] Yes Use as instructed to check blood sugar 5 times daily. Elayne Snare, MD Taking Active   busPIRone (BUSPAR) 15 MG tablet [id number] Yes Take 1 tablet by mouth 2 (two) times daily. [provider] Taking Active            Med Note Marca Ancona, Tobie Poet   Thu Jun 21, 2021  1:27 PM) On HOLD  calcium-vitamin D (OSCAL WITH D) 500-200 MG-UNIT per tablet [id number] No Take 1 tablet by mouth daily with breakfast.  Patient not taking: Reported on 10/03/2021   [provider] Not Taking Active   cetirizine (ZYRTEC) 10 MG tablet [id number] Yes Take 10 mg by mouth daily as needed for allergies. [provider] Taking Active   cholecalciferol (VITAMIN D) 1000 units tablet [id number] No Take 2,000 Units by mouth daily.  Patient not taking: Reported on 10/03/2021   [provider] Not Taking Active   denosumab (PROLIA) 60 MG/ML SOSY injection [id number] Yes Inject 60 mg into the skin every 6 (six) months. [provider] Taking Active   dicyclomine (BENTYL) 10 MG capsule [id number] No TAKE 1 CAPSULE (10 MG TOTAL) BY MOUTH 2 (TWO) TIMES DAILY AS NEEDED FOR SPASMS.  Patient not taking: Reported on 10/03/2021   Alfredia Ferguson, PA-C Not Taking Active   famotidine (PEPCID) 20 MG tablet [id number] Yes TAKE 1 TABLET BY MOUTH EVERYDAY AT BEDTIME Esterwood, Amy S, PA-C Taking Active   L-Methylfolate (DEPLIN) 15 MG TABS [id number] Yes Take 15 mg by mouth daily. For treatment of depression [provider] Taking Active Spouse/Significant Other  lactulose (CHRONULAC) 10 GM/15ML solution [id number] No Take 15-45 mLs (10-30 g total) by mouth 2  (two) times daily as needed for moderate constipation or severe constipation.  Patient not taking: Reported on 10/03/2021   Plotnikov, Evie Lacks, MD Not Taking Active   Lancet Devices ([place] LANCING DEV) [place] [id number] Yes Use daily as directed Elayne Snare, MD Taking Active   Liniments ([place]) [id number] Yes Apply 1 patch topically at bedtime. [provider] Taking Active Spouse/Significant Other  LORazepam (ATIVAN) 1 MG tablet [id number] Yes Take 1 mg by mouth 4 (four) times daily as needed for anxiety. Reports to taking 1/2 tablet at breakfast and lunch and 1 tablet at bedtime [provider] Taking Active Spouse/Significant Other           Med Note Edison Pace, EMMA L   Thu Aug 03, 2015 12:50 PM) Received from: External Pharmacy Received Sig: TK 1 T PO  QID PRN  lovastatin (MEVACOR) 20 MG tablet [id number] No Take 1 tablet (20 mg total) by mouth at bedtime.  Patient not taking: Reported on 10/03/2021   Plotnikov, Evie Lacks, MD Not Taking Active   Magnesium 500 MG CAPS [id number] No Take 1 capsule by mouth daily.  Patient not taking: Reported on 10/03/2021   [provider] Not Taking Active   metFORMIN (GLUCOPHAGE) 500 MG tablet [id number] Yes TAKE 1 TABLET BY MOUTH IN THE MORNING AND 1 TABLET IN THE EVENING. Elayne Snare, MD Taking Active   Multiple Vitamins-Minerals [facility]) tablet [id number] No Take 1 tablet by mouth daily.  Patient not taking:  Reported on 10/03/2021   [provider] Not Taking Active   ondansetron (ZOFRAN) 4 MG tablet [id number] No TAKE 1 TABLET BY MOUTH EVERY MORNING AND 1 IN THE EVENING AS NEEDED.  Patient not taking: Reported on 10/03/2021   Cassandria Anger, MD Not Taking Active   [facility] LANCETS 50D MISC [id number] Yes USE UP TO 4 TIMES A Luanna Salk, MD Taking Active   [facility] VERIO test strip [id number] Yes Use as instructed to check blood sugar once a day Elayne Snare, MD Taking Active    pantoprazole (PROTONIX) 40 MG tablet [id number] Yes TAKE 1 TABLET BY MOUTH TWICE A DAY  Patient taking differently: Take 40 mg by mouth daily.   Esterwood, Amy S, PA-C Taking Active   pioglitazone (ACTOS) 30 MG tablet [id number] Yes TAKE 1 TABLET BY MOUTH EVERY DAY Elayne Snare, MD Taking Active   repaglinide (PRANDIN) 1 MG tablet [id number] Yes TAKE 2 TABLETS BY MOUTH BEFORE BREAKFAST, 1 TABLET AT LUNCH AND 1 TABLET AT Egbert Garibaldi, Vicenta Aly, MD Taking Active   senna-docusate (SENOKOT-S) 8.6-50 MG tablet [id number] Yes Take 2 tablets by mouth 2 (two) times daily. Plotnikov, Evie Lacks, MD Taking Active   SUMAtriptan (IMITREX) 100 MG tablet [id number] Yes Take 1 tablet (100 mg total) by mouth every 2 (two) hours as needed. Max twice a day. Melvenia Beam, MD Taking Active   traMADol Veatrice Bourbon) 50 MG tablet [id number] Yes TAKE 1 TABLET (50 MG TOTAL) BY MOUTH EVERY 12 (TWELVE) HOURS AS NEEDED FOR SEVERE PAIN. Plotnikov, Evie Lacks, MD Taking Active   TRINTELLIX 20 MG TABS [id number] Yes Take 10 mg by mouth daily. Take 10 mg daily [provider] Taking Active            Med Note Amado Coe   Tue May 16, 2020  1:41 PM)    Donna Bernard 18 MG/3ML SOPN [id number] Yes INJECT 1.2MG INTO MUSCLE DAILY Elayne Snare, MD Taking Active   VOLTAREN 1 % GEL [id number] Yes APPLY 2 GRAMS TO KNEE 2-3 TIMES DAILY PRN [provider] Taking Active Spouse/Significant Other           Med Note Amado Coe   Tue May 16, 2020  1:41 PM)    Med List Note Deloris Ping, [place] 07/14/13 1615): DUPLIN-47m-dietary suppliment            Patient Active Problem List   Diagnosis Date Noted   HTN (hypertension) 07/11/2021   Epigastric pain 06/21/2021   CRI (chronic renal insufficiency), stage 3 (moderate) ([place] 06/21/2021   Aphthous ulcer of mouth 02/07/2021   Memory problem 05/23/2020   History of total abdominal hysterectomy and bilateral salpingo-oophorectomy 05/15/2020   Tremor 02/15/2020   Vertigo  11/23/2018   Osteoporosis 07/15/2018   Lumbar radiculopathy 03/23/2018   Diabetic peripheral neuropathy associated with type 2 diabetes mellitus ([place] 04/10/2017   Chronic nausea 03/10/2017   Hypercholesteremia 10/22/2016   Uncontrolled type 2 diabetes with renal manifestation 10/22/2016   IDA (iron deficiency anemia) 10/16/2016   Anemia 09/04/2016   Intractable chronic migraine without aura and with status migrainosus 08/12/2016   CAP (community acquired pneumonia) 05/28/2016   Acute confusional state 05/16/2016   Chronic migraine w/o aura w/o status migrainosus, not intractable 04/13/2016   OSA on CPAP 01/30/2016   Acute URI 10/17/2015   Rash 07/12/2015   SI (sacroiliac) pain 03/30/2015   Sciatica of right side 08/08/2014   Acute blood loss anemia 03/09/2014   DJD (degenerative  joint disease) of knee 11/10/2013   Constipation 10/29/2013   Obesity 08/04/2013   Osteoarthritis of right knee 08/04/2013   Edema 05/11/2013   Fall 09/16/2012   Neck pain 09/16/2012   Low back pain 09/16/2012   Cerumen impaction 08/07/2011   Acute bronchitis 05/21/2011   Fatigue 08/07/2010   PARESTHESIA 08/07/2010   DYSPHAGIA 07/13/2009   Irritable bowel syndrome 09/24/2007   Elevated liver function tests 09/24/2007   DM type 2, controlled, with complication ([place]) 11/07/9456   Anxiety state 06/26/2007   Depression 06/26/2007   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 heart failure 06/26/2007   GERD 06/26/2007   OSTEOARTHRITIS 06/26/2007   COLONIC POLYPS, HX OF 06/26/2007   SPLENECTOMY, TOTAL, HX OF 06/26/2007    Immunization History  Administered Date(s) Administered   Fluad Quad(high Dose 65+) 05/13/2019, 05/23/2020, 05/10/2021   Influenza Split 06/13/2011, 06/02/2012   Influenza Whole 06/07/2009, 04/18/2010   Influenza, High Dose Seasonal PF 06/02/2015, 05/27/2016, 05/15/2017   Influenza,inj,Quad PF,6+ Mos 05/11/2013, 05/16/2014, 05/05/2018   Influenza,inj,quad, With Preservative  05/23/2020   Moderna Sars-Covid-2 Vaccination 09/15/2019, 10/06/2019, 06/12/2020   PFIZER Comirnaty([place])Covid-19 Tri-Sucrose Vaccine 12/14/2020   Pfizer Covid-19 Vaccine Bivalent Booster 45yr & up 05/22/2021   Pneumococcal Conjugate-13 08/04/2013   Pneumococcal Polysaccharide-23 05/14/2006, 06/24/2017   Td 07/01/2012   Zoster Recombinat (Shingrix) 07/20/2018, 10/21/2018   Zoster, Live 10/29/2009    Conditions to be addressed/monitored:  Hyperlipidemia, Diabetes, GERD, Chronic Kidney Disease, Depression, Anxiety, and Osteoporosis  There are no care plans that you recently modified to display for this patient.     Medication Assistance: None required.  Patient affirms current coverage meets needs.  Care Gaps: Hep C screening Colonoscopy   Patient's preferred pharmacy is:  [id number] [place], [place]: [phone]: [phone] Uses pill box? Yes Pt endorses 100% compliance  Care Plan and Follow Up Patient Decision:  Patient agrees to Care Plan and Follow-up.  Plan: Telephone follow up appointment with care management team member scheduled for:  3 months The patient has been provided with contact information for the care management team and has been advised to call with any health related questions or concerns.   DaTomasa BlasePharmD Clinical Pharmacist, [place]

## 2021-10-09 ENCOUNTER — Ambulatory Visit: Payer: PPO | Admitting: Neurology

## 2021-10-09 DIAGNOSIS — G43709 Chronic migraine without aura, not intractable, without status migrainosus: Secondary | ICD-10-CM

## 2021-10-09 NOTE — Progress Notes (Signed)
10/09/2021: still doing exceptionally well, > 80% decrease in migraine frequency and headache frequency and severity   Consent Form Botulism Toxin Injection For Chronic Migraine    Reviewed orally with patient, additionally signature is on file:  Botulism toxin has been approved by the Federal drug administration for treatment of chronic migraine. Botulism toxin does not cure chronic migraine and it may not be effective in some patients.  The administration of botulism toxin is accomplished by injecting a small amount of toxin into the muscles of the neck and head. Dosage must be titrated for each individual. Any benefits resulting from botulism toxin tend to wear off after 3 months with a repeat injection required if benefit is to be maintained. Injections are usually done every 3-4 months with maximum effect peak achieved by about 2 or 3 weeks. Botulism toxin is expensive and you should be sure of what costs you will incur resulting from the injection.  The side effects of botulism toxin use for chronic migraine may include:   -Transient, and usually mild, facial weakness with facial injections  -Transient, and usually mild, head or neck weakness with head/neck injections  -Reduction or loss of forehead facial animation due to forehead muscle weakness  -Eyelid drooping  -Dry eye  -Pain at the site of injection or bruising at the site of injection  -Double vision  -Potential unknown long term risks   Contraindications: You should not have Botox if you are pregnant, nursing, allergic to albumin, have an infection, skin condition, or muscle weakness at the site of the injection, or have myasthenia gravis, Lambert-Eaton syndrome, or ALS.  It is also possible that as with any injection, there may be an allergic reaction or no effect from the medication. Reduced effectiveness after repeated injections is sometimes seen and rarely infection at the injection site may occur. All care will be  taken to prevent these side effects. If therapy is given over a long time, atrophy and wasting in the muscle injected may occur. Occasionally the patient's become refractory to treatment because they develop antibodies to the toxin. In this event, therapy needs to be modified.  I have read the above information and consent to the administration of botulism toxin.    BOTOX PROCEDURE NOTE FOR MIGRAINE HEADACHE  Contraindications and precautions discussed with patient(above). Aseptic procedure was observed and patient tolerated procedure. Procedure performed by Debbora Presto, FNP-C.   The condition has existed for more than 6 months, and pt does not have a diagnosis of ALS, Myasthenia Gravis or Lambert-Eaton Syndrome.  Risks and benefits of injections discussed and pt agrees to proceed with the procedure.  Written consent obtained  These injections are medically necessary. Pt  receives good benefits from these injections. These injections do not cause sedations or hallucinations which the oral therapies may cause.   Description of procedure:  The patient was placed in a sitting position. The standard protocol was used for Botox as follows, with 5 units of Botox injected at each site:  -Procerus muscle, midline injection  -Corrugator muscle, bilateral injection  -Frontalis muscle, bilateral injection, with 2 sites each side, medial injection was performed in the upper one third of the frontalis muscle, in the region vertical from the medial inferior edge of the superior orbital rim. The lateral injection was again in the upper one third of the forehead vertically above the lateral limbus of the cornea, 1.5 cm lateral to the medial injection site.  -Temporalis muscle injection, 4 sites, bilaterally. The  first injection was 3 cm above the tragus of the ear, second injection site was 1.5 cm to 3 cm up from the first injection site in line with the tragus of the ear. The third injection site was 1.5-3  cm forward between the first 2 injection sites. The fourth injection site was 1.5 cm posterior to the second injection site. 5th site laterally in the temporalis  muscleat the level of the outer canthus.  -Occipitalis muscle injection, 3 sites, bilaterally. The first injection was done one half way between the occipital protuberance and the tip of the mastoid process behind the ear. The second injection site was done lateral and superior to the first, 1 fingerbreadth from the first injection. The third injection site was 1 fingerbreadth superiorly and medially from the first injection site.  -Cervical paraspinal muscle injection, 2 sites, bilaterally. The first injection site was 1 cm from the midline of the cervical spine, 3 cm inferior to the lower border of the occipital protuberance. The second injection site was 1.5 cm superiorly and laterally to the first injection site.  -Trapezius muscle injection was performed at 3 sites, bilaterally. The first injection site was in the upper trapezius muscle halfway between the inflection point of the neck, and the acromion. The second injection site was one half way between the acromion and the first injection site. The third injection was done between the first injection site and the inflection point of the neck.   Will return for repeat injection in 3 months.   A total of 155 units of Botox was prepared, 155 units of Botox was injected as documented above, 45U Botox not injected was wasted. The patient tolerated the procedure well, there were no complications of the above procedure.

## 2021-10-09 NOTE — Progress Notes (Signed)
Botox- 200 units Lot: [id number] Expiration: 11/2023 NDC: 0681-6619-69 and GKB:[id number] Exp:06/2023 NDC 9824-2998-06   Bacteriostatic 0.9% Sodium Chloride- 6mL total Lot: [id number] Expiration: 09/26/2022 NDC: 2773-7505-10  Dx: B12.524 B/B

## 2021-10-09 NOTE — Progress Notes (Signed)
Denyse Amass, RN; Vanessa Ralphs Patient is scheduled for 02/17      Previous Messages   ----- Message -----  From: Brandon Melnick, RN  Sent: 10/09/2021  10:10 AM EST  To: Ocie Bob, *   New order in Epic for pt.   Ameris L. Bozich  Female, 81 y.o., 07/07/1941  MRN:  [id number]  Thank you Lovey Newcomer RN

## 2021-10-11 ENCOUNTER — Telehealth: Payer: Self-pay

## 2021-10-11 NOTE — Telephone Encounter (Signed)
Scheduled pt for HST with her husband and during that conversation pt mentioned that they are interested in [place].

## 2021-10-14 ENCOUNTER — Other Ambulatory Visit: Payer: Self-pay | Admitting: Endocrinology

## 2021-10-17 ENCOUNTER — Ambulatory Visit: Payer: PPO | Admitting: Neurology

## 2021-10-22 ENCOUNTER — Ambulatory Visit (INDEPENDENT_AMBULATORY_CARE_PROVIDER_SITE_OTHER): Payer: PPO | Admitting: Neurology

## 2021-10-22 DIAGNOSIS — Z789 Other specified health status: Secondary | ICD-10-CM

## 2021-10-22 DIAGNOSIS — G4733 Obstructive sleep apnea (adult) (pediatric): Secondary | ICD-10-CM | POA: Diagnosis not present

## 2021-10-24 NOTE — Progress Notes (Signed)
° °  [facility]  HOME SLEEP TEST (Watch PAT) REPORT  STUDY DATE: 10/22/2021  DOB: [birth year]  MRN: [id number]  ORDERING CLINICIAN: Star Age, MD, PhD    CLINICAL INFORMATION/HISTORY: 81 year old right-handed woman with an underlying medical history of anxiety, depression, reflux disease, IBS, hyperlipidemia, diverticulosis, hiatal hernia, osteopenia, leg edema, recurrent UTIs, type 2 diabetes, tremor, migraine headaches and overweight state, who presents for reevaluation of her obstructive sleep apnea.  She has been on CPAP therapy, she is eligible for new equipment.  Her current machine is not working properly.  BMI: 23.1 kg/m  FINDINGS:   Sleep Summary:   Total Recording Time (hours, min): 8 hours, 56 minutes  Total Sleep Time (hours, min):  8 hours, 12 minutes   Percent REM (%):    23.8%   Respiratory Indices:   Calculated pAHI (per hour):  42.7/hour         REM pAHI:    46.1/hour       NREM pAHI: 41.9/hour  Oxygen Saturation Statistics:    Oxygen Saturation (%) Mean: 94%   Minimum oxygen saturation (%):                 73%   O2 Saturation Range (%): 73-100%    O2 Saturation (minutes) <=88%: 8.6 min  Pulse Rate Statistics:   Pulse Mean (bpm):    98/min    Pulse Range (68-122/min)   IMPRESSION: OSA (obstructive sleep apnea), severe  RECOMMENDATION:  This home sleep test demonstrates severe obstructive sleep apnea with a total AHI of 42.7/hour and O2 nadir of 73%.  Mild to moderate snoring was detected.  Ongoing treatment with positive airway pressure is highly recommended.  I will write for a new AutoPap machine.  We will keep the pressure range close to her current set pressure of 9 cm.  A laboratory attended titration study can be considered in the future for optimization of her treatment and better tolerance of therapy.  Alternative treatment options are limited secondary to the severity of the patient's sleep disordered breathing.   Please note, that untreated obstructive sleep apnea may carry additional perioperative morbidity. Patients with significant obstructive sleep apnea should receive perioperative PAP therapy and the surgeons and particularly the anesthesiologist should be informed of the diagnosis and the severity of the sleep disordered breathing. The patient should be cautioned not to drive, work at heights, or operate dangerous or heavy equipment when tired or sleepy. Review and reiteration of good sleep hygiene measures should be pursued with any patient. Other causes of the patient's symptoms, including circadian rhythm disturbances, an underlying mood disorder, medication effect and/or an underlying medical problem cannot be ruled out based on this test. Clinical correlation is recommended. The patient and his referring provider will be notified of the test results. The patient will be seen in follow up in sleep clinic at [facility].  I certify that I have reviewed the raw data recording prior to the issuance of this report in accordance with the standards of the American Academy of Sleep Medicine (AASM).  INTERPRETING PHYSICIAN:   Star Age, MD, PhD  Board Certified in Neurology and Sleep Medicine  [facility] [address], [place], [place] [address] [phone]

## 2021-10-24 NOTE — Addendum Note (Signed)
Addended by: Star Age on: 10/24/2021 05:34 PM   Modules accepted: Orders

## 2021-10-24 NOTE — Procedures (Signed)
° °  [name]  HOME SLEEP TEST (Watch PAT) REPORT  STUDY DATE: 10/22/2021  DOB: 11-Apr-1941  MRN: [id number]  ORDERING CLINICIAN: Star Age, MD, PhD    CLINICAL INFORMATION/HISTORY: 81 year old right-handed woman with an underlying medical history of anxiety, depression, reflux disease, IBS, hyperlipidemia, diverticulosis, hiatal hernia, osteopenia, leg edema, recurrent UTIs, type 2 diabetes, tremor, migraine headaches and overweight state, who presents for reevaluation of her obstructive sleep apnea.  She has been on CPAP therapy, she is eligible for new equipment.  Her current machine is not working properly.  BMI: 23.1 kg/m  FINDINGS:   Sleep Summary:   Total Recording Time (hours, min): 8 hours, 56 minutes  Total Sleep Time (hours, min):  8 hours, 12 minutes   Percent REM (%):    23.8%   Respiratory Indices:   Calculated pAHI (per hour):  42.7/hour         REM pAHI:    46.1/hour       NREM pAHI: 41.9/hour  Oxygen Saturation Statistics:    Oxygen Saturation (%) Mean: 94%   Minimum oxygen saturation (%):                 73%   O2 Saturation Range (%): 73-100%    O2 Saturation (minutes) <=88%: 8.6 min  Pulse Rate Statistics:   Pulse Mean (bpm):    98/min    Pulse Range (68-122/min)   IMPRESSION: OSA (obstructive sleep apnea), severe  RECOMMENDATION:  This home sleep test demonstrates severe obstructive sleep apnea with a total AHI of 42.7/hour and O2 nadir of 73%.  Mild to moderate snoring was detected.  Ongoing treatment with positive airway pressure is highly recommended.  I will write for a new AutoPap machine.  We will keep the pressure range close to her current set pressure of 9 cm.  A laboratory attended titration study can be considered in the future for optimization of her treatment and better tolerance of therapy.  Alternative treatment options are limited secondary to the severity of the patient's sleep disordered breathing.   Please note, that untreated obstructive sleep apnea may carry additional perioperative morbidity. Patients with significant obstructive sleep apnea should receive perioperative PAP therapy and the surgeons and particularly the anesthesiologist should be informed of the diagnosis and the severity of the sleep disordered breathing. The patient should be cautioned not to drive, work at heights, or operate dangerous or heavy equipment when tired or sleepy. Review and reiteration of good sleep hygiene measures should be pursued with any patient. Other causes of the patient's symptoms, including circadian rhythm disturbances, an underlying mood disorder, medication effect and/or an underlying medical problem cannot be ruled out based on this test. Clinical correlation is recommended. The patient and his referring provider will be notified of the test results. The patient will be seen in follow up in sleep clinic at [facility].  I certify that I have reviewed the raw data recording prior to the issuance of this report in accordance with the standards of the American Academy of Sleep Medicine (AASM).  INTERPRETING PHYSICIAN:   Star Age, MD, PhD  Board Certified in Neurology and Sleep Medicine  [facility] [address], [place], [place] [address] [phone]

## 2021-10-25 ENCOUNTER — Telehealth: Payer: Self-pay | Admitting: *Deleted

## 2021-10-25 ENCOUNTER — Encounter: Payer: Self-pay | Admitting: Neurology

## 2021-10-25 NOTE — Telephone Encounter (Signed)
Spoke with patient's husband and discussed patient's sleep study results and that she is eligible for a new AutoPap machine and the order is ready to be sent to DME.  Discussed preference of keeping current DME versus switching.  He would like to try [facility].  He is aware of insurance requirements.  Patient currently has an appointment scheduled with [facility] NP on April 19.  We will keep that appointment for now.  He is aware of the patient has not received her new machine March 18, he should call us to push the appointment out some so we have 31 to 90 days of data to review at the appointment.  His questions were answered.  They had not read Dr. Guadelupe Sabin response to question about inspire on MyChart, so I went over that with him.  He will discuss with the patient.  For now, would like to proceed with getting the new AutoPap machine.  He is aware [facility] will call to discuss the next steps. ? ?Request for transfer of care with new order faxed to [facility]. Received a receipt of confirmation. F/u letter sent to pt via mychart (husband's preference).  ? ?

## 2021-10-25 NOTE — Telephone Encounter (Signed)
-----   Message from Star Age, MD sent at 10/24/2021  5:34 PM EST ----- ?Patient was last seen by me on 10/03/2021, she had trouble with her current CPAP machine.  She was eligible for a new machine, she had a home sleep test on 10/22/2021 which indicated severe obstructive sleep apnea.  Please advise patient that I would like to write for a new AutoPap machine.  We will keep her pressure close to her current set pressure of 9 cm, we will send the order to her current DME company, she will need to follow-up in sleep clinic within 3 months of starting treatment with her new equipment.  Please reinforce the importance of compliance with treatment, she will likely have to call us back once she has been issued a new machine.   ? ?

## 2021-10-30 ENCOUNTER — Ambulatory Visit: Payer: PPO | Admitting: Internal Medicine

## 2021-11-12 ENCOUNTER — Ambulatory Visit: Payer: PPO | Admitting: Internal Medicine

## 2021-11-12 ENCOUNTER — Other Ambulatory Visit: Payer: Self-pay | Admitting: Neurology

## 2021-11-12 ENCOUNTER — Encounter: Payer: Self-pay | Admitting: Neurology

## 2021-11-12 MED ORDER — UBRELVY 100 MG PO TABS: 100.0000 mg | ORAL_TABLET | ORAL | 0 refills | Status: DC | PRN

## 2021-11-12 NOTE — Telephone Encounter (Signed)
I called pts husband and relayed the Ubrelvy samples 4 box available and he can pick up by 5pm.  He appreciated call back.  ?

## 2021-11-12 NOTE — Telephone Encounter (Signed)
Spoke to pts husband and relayed samples of this Ubrlvy '100mg'$  tablets will be up front to try.  #4 tabs LOT [id number] exp 01/2023.   ?

## 2021-11-12 NOTE — Telephone Encounter (Signed)
Pt last seen for BOTOX 10-09-2021. Is using CPAP nightly. Taking sumatriptan prn.  ?

## 2021-11-12 NOTE — Progress Notes (Signed)
Samples #4 left up front for husband to pick up.  Ubrelvy '100mg'$  tabs.  ?

## 2021-11-14 ENCOUNTER — Telehealth: Payer: Self-pay | Admitting: *Deleted

## 2021-11-14 NOTE — Telephone Encounter (Addendum)
Deductible  n/a ? ?OOP MAX $3200 ($302.76) ? ?Annual exam 06/26/2021 tw ? ?Calcium   9.9          Date 09/24/2021 ? ?Upcoming dental procedures  no ? ?Prior Authorization needed no ? ?Pt estimated Cost $0 ? ?Appt 12/06/2021 ? ? ? ? ?Coverage Details:Prolia? will be subject to a 20% coinsurance ?up to a $3200 out of pocket max ($302.76 met). Once met, coverage increases to 100%. Administration is ?subject to $25 copay, which do contribute to OOP max. No deductible applies. Referral is not required ? ?

## 2021-11-19 ENCOUNTER — Telehealth: Payer: Self-pay | Admitting: Neurology

## 2021-11-19 MED ORDER — SUMATRIPTAN SUCCINATE 100 MG PO TABS: 100.0000 mg | ORAL_TABLET | ORAL | 11 refills | Status: DC | PRN

## 2021-11-19 NOTE — Telephone Encounter (Signed)
Called husband back. Insurance will only cover 9/30days and not 10/30 as previously sent.He would like new rx sent for 9/30. I e-scribed [facility] as requested. He verbalized understanding.  ?

## 2021-11-19 NOTE — Telephone Encounter (Signed)
[name] called stating that the ins will cover the 10qt for the pt's SUMAtriptan (IMITREX) 100 MG tablet but will for 9qt. Husband would like to know if the Rx can be changed. Please advise.  ?

## 2021-11-20 DIAGNOSIS — G4733 Obstructive sleep apnea (adult) (pediatric): Secondary | ICD-10-CM | POA: Diagnosis not present

## 2021-11-26 ENCOUNTER — Ambulatory Visit: Payer: PPO | Admitting: Internal Medicine

## 2021-12-03 ENCOUNTER — Other Ambulatory Visit: Payer: Self-pay | Admitting: Internal Medicine

## 2021-12-05 ENCOUNTER — Ambulatory Visit: Payer: PPO | Admitting: Adult Health

## 2021-12-06 ENCOUNTER — Ambulatory Visit: Payer: PPO

## 2021-12-07 DIAGNOSIS — G4733 Obstructive sleep apnea (adult) (pediatric): Secondary | ICD-10-CM | POA: Diagnosis not present

## 2021-12-10 ENCOUNTER — Ambulatory Visit (INDEPENDENT_AMBULATORY_CARE_PROVIDER_SITE_OTHER): Payer: PPO | Admitting: Gynecology

## 2021-12-10 ENCOUNTER — Encounter: Payer: Self-pay | Admitting: Internal Medicine

## 2021-12-10 ENCOUNTER — Ambulatory Visit (INDEPENDENT_AMBULATORY_CARE_PROVIDER_SITE_OTHER): Payer: PPO | Admitting: Internal Medicine

## 2021-12-10 DIAGNOSIS — M1990 Unspecified osteoarthritis, unspecified site: Secondary | ICD-10-CM | POA: Diagnosis not present

## 2021-12-10 DIAGNOSIS — E1142 Type 2 diabetes mellitus with diabetic polyneuropathy: Secondary | ICD-10-CM | POA: Diagnosis not present

## 2021-12-10 DIAGNOSIS — M810 Age-related osteoporosis without current pathological fracture: Secondary | ICD-10-CM | POA: Diagnosis not present

## 2021-12-10 DIAGNOSIS — R7989 Other specified abnormal findings of blood chemistry: Secondary | ICD-10-CM | POA: Diagnosis not present

## 2021-12-10 DIAGNOSIS — R413 Other amnesia: Secondary | ICD-10-CM

## 2021-12-10 DIAGNOSIS — R232 Flushing: Secondary | ICD-10-CM

## 2021-12-10 LAB — COMPREHENSIVE METABOLIC PANEL
ALT: 14 U/L (ref 0–35)
AST: 21 U/L (ref 0–37)
Albumin: 4.2 g/dL (ref 3.5–5.2)
Alkaline Phosphatase: 42 U/L (ref 39–117)
BUN: 13 mg/dL (ref 6–23)
CO2: 30 mEq/L (ref 19–32)
Calcium: 9.9 mg/dL (ref 8.4–10.5)
Chloride: 97 mEq/L (ref 96–112)
Creatinine, Ser: 0.93 mg/dL (ref 0.40–1.20)
GFR: 58.13 mL/min — ABNORMAL LOW (ref >60.00)
Glucose, Bld: 94 mg/dL (ref 70–99)
Potassium: 3.9 mEq/L (ref 3.5–5.1)
Sodium: 133 mEq/L — ABNORMAL LOW (ref 135–145)
Total Bilirubin: 0.3 mg/dL (ref 0.2–1.2)
Total Protein: 7.1 g/dL (ref 6.0–8.3)

## 2021-12-10 LAB — TSH: TSH: 0.89 u[IU]/mL (ref 0.35–5.50)

## 2021-12-10 MED ORDER — DENOSUMAB 60 MG/ML ~~LOC~~ SOSY
60.0000 mg | PREFILLED_SYRINGE | Freq: Once | SUBCUTANEOUS | Status: AC
  Administered 2021-12-10: 60 mg via SUBCUTANEOUS

## 2021-12-10 NOTE — Assessment & Plan Note (Signed)
Blue-Emu cream was recommended to use 2-3 times a day ?Tramadol prn ?

## 2021-12-10 NOTE — Assessment & Plan Note (Signed)
Stable  ?No change ?Cont on [place] Mushroom extract or capsules for memory ?

## 2021-12-10 NOTE — Assessment & Plan Note (Signed)
Check TSH, CMET ?RTC ?Use a fan ?

## 2021-12-10 NOTE — Assessment & Plan Note (Signed)
Actos, Prandin, Victoza ?F/u w/Dr Dwyane Dee ?Loosing weight ?

## 2021-12-10 NOTE — Progress Notes (Signed)
? ?Subjective:  ?Patient ID: [name], female    DOB: 07/21/1941  Age: 81 y.o. MRN: [id number] ? ?CC: No chief complaint on file. ? ? ?HPI ?[name] presents for DM, HTN, wt loss - on Victoza ?C/o HAs ? ?Outpatient Medications Prior to Visit  ?Medication Sig Dispense Refill  ? acetaminophen (TYLENOL) 325 MG tablet Take 325 mg by mouth 2 (two) times daily as needed. Takes with tramadol    ? aspirin EC 81 MG tablet Take 1 tablet (81 mg total) by mouth daily. 100 tablet 3  ? BD PEN NEEDLE NANO U/F 32G X 4 MM MISC USE AS DIRECTED TWICE DAILY. (Patient taking differently: USE AS DIRECTED DAILY.) 200 each 3  ? Blood Glucose Monitoring Suppl ([place]) w/Device KIT Use as instructed to check blood sugar 5 times daily. 1 kit 0  ? busPIRone (BUSPAR) 15 MG tablet Take 1 tablet by mouth 2 (two) times daily.    ? cetirizine (ZYRTEC) 10 MG tablet Take 10 mg by mouth daily as needed for allergies.    ? denosumab (PROLIA) 60 MG/ML SOSY injection Inject 60 mg into the skin every 6 (six) months.    ? famotidine (PEPCID) 20 MG tablet TAKE 1 TABLET BY MOUTH EVERYDAY AT BEDTIME 90 tablet 4  ? L-Methylfolate (DEPLIN) 15 MG TABS Take 15 mg by mouth daily. For treatment of depression    ? Lancet Devices (ONE TOUCH DELICA LANCING DEV) MISC Use daily as directed 1 each 0  ? Liniments (SALONPAS PAIN RELIEF PATCH EX) Apply 1 patch topically at bedtime.    ? LORazepam (ATIVAN) 1 MG tablet Take 1 mg by mouth 4 (four) times daily as needed for anxiety. Reports to taking 1/2 tablet at breakfast and lunch and 1 tablet at bedtime  2  ? metFORMIN (GLUCOPHAGE) 500 MG tablet TAKE 1 TABLET BY MOUTH IN THE MORNING AND 1 TABLET IN THE EVENING. 180 tablet 1  ? Multiple Vitamins-Minerals (MULTIVITAMIN,TX-MINERALS) tablet Take 1 tablet by mouth daily.    ? ONETOUCH DELICA LANCETS 34H MISC USE UP TO 4 TIMES A DAY 200 each 3  ? ONETOUCH VERIO test strip Use as instructed to check blood sugar once a day 100 each 3  ? pantoprazole  (PROTONIX) 40 MG tablet TAKE 1 TABLET BY MOUTH TWICE A DAY (Patient taking differently: Take 40 mg by mouth daily.) 180 tablet 4  ? pioglitazone (ACTOS) 30 MG tablet TAKE 1 TABLET BY MOUTH EVERY DAY 90 tablet 1  ? repaglinide (PRANDIN) 1 MG tablet TAKE 2 TABLETS BY MOUTH BEFORE BREAKFAST, 1 TABLET AT LUNCH AND 1 TABLET AT DINNER 360 tablet 1  ? senna-docusate (SENOKOT-S) 8.6-50 MG tablet Take 2 tablets by mouth 2 (two) times daily. 100 tablet 6  ? SUMAtriptan (IMITREX) 100 MG tablet Take 1 tablet (100 mg total) by mouth every 2 (two) hours as needed. Max twice a day. 9 tablet 11  ? traMADol (ULTRAM) 50 MG tablet TAKE 1 TABLET (50 MG TOTAL) BY MOUTH EVERY 12 (TWELVE) HOURS AS NEEDED FOR SEVERE PAIN. 180 tablet 1  ? TRINTELLIX 20 MG TABS Take 10 mg by mouth daily. Take 10 mg daily  2  ? VICTOZA 18 MG/3ML SOPN INJECT 1.2MG INTO MUSCLE DAILY 18 mL 2  ? VOLTAREN 1 % GEL APPLY 2 GRAMS TO KNEE 2-3 TIMES DAILY PRN  1  ? Ubrogepant (UBRELVY) 100 MG TABS Take 100 mg by mouth every 2 (two) hours as needed. Maximum 252m  a day. SAMPLES 4 tablet 0  ? b complex vitamins tablet Take 1 tablet by mouth daily. Vitamin supplement (Patient not taking: Reported on 10/03/2021)    ? calcium-vitamin D (OSCAL WITH D) 500-200 MG-UNIT per tablet Take 1 tablet by mouth daily with breakfast. (Patient not taking: Reported on 10/03/2021)    ? cholecalciferol (VITAMIN D) 1000 units tablet Take 2,000 Units by mouth daily. (Patient not taking: Reported on 10/03/2021)    ? dicyclomine (BENTYL) 10 MG capsule TAKE 1 CAPSULE (10 MG TOTAL) BY MOUTH 2 (TWO) TIMES DAILY AS NEEDED FOR SPASMS. (Patient not taking: Reported on 10/03/2021) 180 capsule 1  ? lactulose (CHRONULAC) 10 GM/15ML solution Take 15-45 mLs (10-30 g total) by mouth 2 (two) times daily as needed for moderate constipation or severe constipation. (Patient not taking: Reported on 10/03/2021) 496 mL 3  ? lovastatin (MEVACOR) 20 MG tablet Take 1 tablet (20 mg total) by mouth at bedtime. (Patient not  taking: Reported on 10/03/2021) 90 tablet 3  ? Magnesium 500 MG CAPS Take 1 capsule by mouth daily. (Patient not taking: Reported on 10/03/2021)    ? ondansetron (ZOFRAN) 4 MG tablet TAKE 1 TABLET BY MOUTH EVERY MORNING AND 1 IN THE EVENING AS NEEDED. (Patient not taking: Reported on 10/03/2021) 180 tablet 3  ? ?No facility-administered medications prior to visit.  ? ? ?ROS: ?Review of Systems  ?Constitutional:  Positive for fatigue and unexpected weight change. Negative for activity change, appetite change and chills.  ?HENT:  Negative for congestion, mouth sores and sinus pressure.   ?Eyes:  Negative for visual disturbance.  ?Respiratory:  Negative for cough and chest tightness.   ?Gastrointestinal:  Negative for abdominal pain and nausea.  ?Genitourinary:  Negative for difficulty urinating, frequency and vaginal pain.  ?Musculoskeletal:  Negative for back pain and gait problem.  ?Skin:  Negative for pallor and rash.  ?Neurological:  Positive for headaches. Negative for dizziness, tremors, weakness and numbness.  ?Psychiatric/Behavioral:  Positive for dysphoric mood. Negative for confusion, sleep disturbance and suicidal ideas. The patient is nervous/anxious.   ? ?Objective:  ?BP 130/82 (BP Location: Left Arm, Patient Position: Sitting, Cuff Size: Large)   Pulse (!) 110   Temp 97.7 ?F (36.5 ?C) (Oral)   Ht '5\' 2"'  (1.575 m)   Wt 121 lb (54.9 kg)   SpO2 92%   BMI 22.13 kg/m?  ? ?BP Readings from Last 3 Encounters:  ?12/10/21 130/82  ?10/03/21 116/66  ?10/01/21 126/78  ? ? ?Wt Readings from Last 3 Encounters:  ?12/10/21 121 lb (54.9 kg)  ?10/03/21 125 lb 9.6 oz (57 kg)  ?10/01/21 124 lb 9.6 oz (56.5 kg)  ? ? ?Physical Exam ?Constitutional:   ?   General: She is not in acute distress. ?   Appearance: She is well-developed.  ?HENT:  ?   Head: Normocephalic.  ?   Right Ear: External ear normal.  ?   Left Ear: External ear normal.  ?   Nose: Nose normal.  ?Eyes:  ?   General:     ?   Right eye: No discharge.     ?   Left  eye: No discharge.  ?   Conjunctiva/sclera: Conjunctivae normal.  ?   Pupils: Pupils are equal, round, and reactive to light.  ?Neck:  ?   Thyroid: No thyromegaly.  ?   Vascular: No JVD.  ?   Trachea: No tracheal deviation.  ?Cardiovascular:  ?   Rate and Rhythm: Normal rate and regular  rhythm.  ?   Heart sounds: Normal heart sounds.  ?Pulmonary:  ?   Effort: No respiratory distress.  ?   Breath sounds: No stridor. No wheezing.  ?Abdominal:  ?   General: Bowel sounds are normal. There is no distension.  ?   Palpations: Abdomen is soft. There is no mass.  ?   Tenderness: There is no abdominal tenderness. There is no guarding or rebound.  ?Musculoskeletal:     ?   General: No tenderness.  ?   Cervical back: Normal range of motion and neck supple. No rigidity.  ?Lymphadenopathy:  ?   Cervical: No cervical adenopathy.  ?Skin: ?   Findings: No erythema or rash.  ?Neurological:  ?   Mental Status: She is oriented to person, place, and time.  ?   Cranial Nerves: No cranial nerve deficit.  ?   Motor: No abnormal muscle tone.  ?   Coordination: Coordination abnormal.  ?   Deep Tendon Reflexes: Reflexes normal.  ?Psychiatric:     ?   Behavior: Behavior normal.     ?   Thought Content: Thought content normal.     ?   Judgment: Judgment normal.  ? ? ?Lab Results  ?Component Value Date  ? WBC 6.9 06/21/2021  ? HGB 11.6 (L) 06/21/2021  ? HCT 35.6 (L) 06/21/2021  ? PLT 400.0 06/21/2021  ? GLUCOSE 153 (H) 09/24/2021  ? CHOL 116 05/10/2020  ? TRIG 53.0 05/10/2020  ? HDL 71.30 05/10/2020  ? [place] 34 05/10/2020  ? ALT 15 06/21/2021  ? AST 24 06/21/2021  ? NA 135 09/24/2021  ? K 4.9 09/24/2021  ? CL 98 09/24/2021  ? CREATININE 1.13 09/24/2021  ? BUN 14 09/24/2021  ? CO2 28 09/24/2021  ? TSH 1.410 05/17/2020  ? INR 0.93 11/02/2013  ? HGBA1C 6.9 (H) 09/24/2021  ? MICROALBUR 1.4 09/18/2020  ? ? ?CT Abdomen Pelvis W Contrast ? ?Result Date: 06/28/2021 ?CLINICAL DATA:  Epigastric pain.  Nausea and vomiting. EXAM: CT ABDOMEN AND PELVIS WITH  CONTRAST TECHNIQUE: Multidetector CT imaging of the abdomen and pelvis was performed using the standard protocol following bolus administration of intravenous contrast. CONTRAST:  62m OMNIPAQUE IOHEXOL 350 MG

## 2021-12-10 NOTE — Assessment & Plan Note (Signed)
Monitoring LFT ?

## 2021-12-12 ENCOUNTER — Ambulatory Visit: Payer: PPO | Admitting: Adult Health

## 2021-12-21 DIAGNOSIS — G4733 Obstructive sleep apnea (adult) (pediatric): Secondary | ICD-10-CM | POA: Diagnosis not present

## 2022-01-08 ENCOUNTER — Ambulatory Visit: Payer: PPO | Admitting: Neurology

## 2022-01-08 DIAGNOSIS — G43709 Chronic migraine without aura, not intractable, without status migrainosus: Secondary | ICD-10-CM | POA: Diagnosis not present

## 2022-01-08 MED ORDER — KETOROLAC TROMETHAMINE 60 MG/2ML IM SOLN
60.0000 mg | Freq: Once | INTRAMUSCULAR | Status: AC
  Administered 2022-01-08: 60 mg via INTRAMUSCULAR

## 2022-01-08 NOTE — Progress Notes (Signed)
Per Dr.Ahern Gave Toradol injection IM  Injection site was right outer quadrant .Pt tolerated fairly well. Placed bandade on injection site. Pt stayed a extra 10  minutes to make sure she didn't have a reaction to medication. After 10 minutes patient states she feels better pt went to check out  ?

## 2022-01-08 NOTE — Progress Notes (Signed)
?01/08/2022: stable ?10/09/2021: still doing exceptionally well, > 80% decrease in migraine frequency and headache frequency and severity  ? ?Consent Form ?Botulism Toxin Injection For Chronic Migraine ? ? ? ?Reviewed orally with patient, additionally signature is on file: ? ?Botulism toxin has been approved by the Federal drug administration for treatment of chronic migraine. Botulism toxin does not cure chronic migraine and it may not be effective in some patients. ? ?The administration of botulism toxin is accomplished by injecting a small amount of toxin into the muscles of the neck and head. Dosage must be titrated for each individual. Any benefits resulting from botulism toxin tend to wear off after 3 months with a repeat injection required if benefit is to be maintained. Injections are usually done every 3-4 months with maximum effect peak achieved by about 2 or 3 weeks. Botulism toxin is expensive and you should be sure of what costs you will incur resulting from the injection. ? ?The side effects of botulism toxin use for chronic migraine may include: ? ? -Transient, and usually mild, facial weakness with facial injections ? -Transient, and usually mild, head or neck weakness with head/neck injections ? -Reduction or loss of forehead facial animation due to forehead muscle weakness ? -Eyelid drooping ? -Dry eye ? -Pain at the site of injection or bruising at the site of injection ? -Double vision ? -Potential unknown long term risks ? ? ?Contraindications: You should not have Botox if you are pregnant, nursing, allergic to albumin, have an infection, skin condition, or muscle weakness at the site of the injection, or have myasthenia gravis, Lambert-Eaton syndrome, or ALS. ? ?It is also possible that as with any injection, there may be an allergic reaction or no effect from the medication. Reduced effectiveness after repeated injections is sometimes seen and rarely infection at the injection site may occur.  All care will be taken to prevent these side effects. If therapy is given over a long time, atrophy and wasting in the muscle injected may occur. Occasionally the patient's become refractory to treatment because they develop antibodies to the toxin. In this event, therapy needs to be modified. ? ?I have read the above information and consent to the administration of botulism toxin. ? ? ? ?BOTOX PROCEDURE NOTE FOR MIGRAINE HEADACHE ? ?Contraindications and precautions discussed with patient(above). Aseptic procedure was observed and patient tolerated procedure. Procedure performed by Debbora Presto, FNP-C.  ? ?The condition has existed for more than 6 months, and pt does not have a diagnosis of ALS, Myasthenia Gravis or Lambert-Eaton Syndrome.  Risks and benefits of injections discussed and pt agrees to proceed with the procedure.  Written consent obtained ? ?These injections are medically necessary. Pt  receives good benefits from these injections. These injections do not cause sedations or hallucinations which the oral therapies may cause. ? ? ?Description of procedure: ? ?The patient was placed in a sitting position. The standard protocol was used for Botox as follows, with 5 units of Botox injected at each site: ? ?-Procerus muscle, midline injection ? ?-Corrugator muscle, bilateral injection ? ?-Frontalis muscle, bilateral injection, with 2 sites each side, medial injection was performed in the upper one third of the frontalis muscle, in the region vertical from the medial inferior edge of the superior orbital rim. The lateral injection was again in the upper one third of the forehead vertically above the lateral limbus of the cornea, 1.5 cm lateral to the medial injection site. ? ?-Temporalis muscle injection, 4 sites, bilaterally.  The first injection was 3 cm above the tragus of the ear, second injection site was 1.5 cm to 3 cm up from the first injection site in line with the tragus of the ear. The third injection  site was 1.5-3 cm forward between the first 2 injection sites. The fourth injection site was 1.5 cm posterior to the second injection site. 5th site laterally in the temporalis  muscleat the level of the outer canthus. ? ?-Occipitalis muscle injection, 3 sites, bilaterally. The first injection was done one half way between the occipital protuberance and the tip of the mastoid process behind the ear. The second injection site was done lateral and superior to the first, 1 fingerbreadth from the first injection. The third injection site was 1 fingerbreadth superiorly and medially from the first injection site. ? ?-Cervical paraspinal muscle injection, 2 sites, bilaterally. The first injection site was 1 cm from the midline of the cervical spine, 3 cm inferior to the lower border of the occipital protuberance. The second injection site was 1.5 cm superiorly and laterally to the first injection site. ? ?-Trapezius muscle injection was performed at 3 sites, bilaterally. The first injection site was in the upper trapezius muscle halfway between the inflection point of the neck, and the acromion. The second injection site was one half way between the acromion and the first injection site. The third injection was done between the first injection site and the inflection point of the neck. ? ? ?Will return for repeat injection in 3 months. ? ? ?A total of 155 units of Botox was prepared, 155 units of Botox was injected as documented above, 45U Botox not injected was wasted. The patient tolerated the procedure well, there were no complications of the above procedure. ? ?

## 2022-01-08 NOTE — Addendum Note (Signed)
Addended by: Skeet Simmer A on: 01/08/2022 04:07 PM ? ? Modules accepted: Orders ? ?

## 2022-01-08 NOTE — Progress Notes (Signed)
Botox- 200 units x 1 vial ?Lot: [id number] ?Expiration: 07/2024 ?[place]: [id number] ? ?Bacteriostatic 0.9% Sodium Chloride- 75m total ?Lot: [id number]?Expiration: 03/2023 ?[place] [address]-55? ?[place]: G43.709 ? ? B/B ? ?

## 2022-01-12 ENCOUNTER — Other Ambulatory Visit: Payer: Self-pay | Admitting: Physician Assistant

## 2022-01-20 DIAGNOSIS — G4733 Obstructive sleep apnea (adult) (pediatric): Secondary | ICD-10-CM | POA: Diagnosis not present

## 2022-01-22 ENCOUNTER — Other Ambulatory Visit: Payer: Self-pay | Admitting: Endocrinology

## 2022-01-24 ENCOUNTER — Telehealth: Payer: Self-pay | Admitting: Adult Health

## 2022-01-24 NOTE — Telephone Encounter (Signed)
LVM and sent mychart msg informing pt of appt change- Megan working from home.

## 2022-01-29 ENCOUNTER — Other Ambulatory Visit: Payer: PPO

## 2022-01-29 ENCOUNTER — Other Ambulatory Visit: Payer: Self-pay | Admitting: Physician Assistant

## 2022-01-31 ENCOUNTER — Telehealth: Payer: PPO | Admitting: Adult Health

## 2022-01-31 ENCOUNTER — Ambulatory Visit: Payer: PPO | Admitting: Endocrinology

## 2022-02-04 ENCOUNTER — Other Ambulatory Visit: Payer: Self-pay | Admitting: Endocrinology
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
## 2022-02-05 ENCOUNTER — Encounter: Payer: Self-pay | Admitting: Adult Health

## 2022-02-05 ENCOUNTER — Ambulatory Visit: Payer: PPO | Admitting: Adult Health

## 2022-02-05 VITALS — BP 141/83 | HR 113

## 2022-02-05 DIAGNOSIS — G4733 Obstructive sleep apnea (adult) (pediatric): Secondary | ICD-10-CM | POA: Diagnosis not present

## 2022-02-05 DIAGNOSIS — Z9989 Dependence on other enabling machines and devices: Secondary | ICD-10-CM

## 2022-02-05 NOTE — Progress Notes (Signed)
Fax confirmation received [facility] [phone] with increase press to 7-12.

## 2022-02-15 ENCOUNTER — Other Ambulatory Visit: Payer: PPO

## 2022-02-15 ENCOUNTER — Ambulatory Visit: Payer: PPO

## 2022-02-18 ENCOUNTER — Other Ambulatory Visit (INDEPENDENT_AMBULATORY_CARE_PROVIDER_SITE_OTHER): Payer: PPO

## 2022-02-18 DIAGNOSIS — E1165 Type 2 diabetes mellitus with hyperglycemia: Secondary | ICD-10-CM

## 2022-02-18 DIAGNOSIS — E7800 Pure hypercholesterolemia, unspecified: Secondary | ICD-10-CM | POA: Diagnosis not present

## 2022-02-18 LAB — COMPREHENSIVE METABOLIC PANEL
ALT: 13 U/L (ref 0–35)
AST: 21 U/L (ref 0–37)
Albumin: 4.1 g/dL (ref 3.5–5.2)
Alkaline Phosphatase: 44 U/L (ref 39–117)
BUN: 14 mg/dL (ref 6–23)
CO2: 28 mEq/L (ref 19–32)
Calcium: 9.9 mg/dL (ref 8.4–10.5)
Chloride: 98 mEq/L (ref 96–112)
Creatinine, Ser: 0.98 mg/dL (ref 0.40–1.20)
GFR: 54.52 mL/min — ABNORMAL LOW (ref >60.00)
Glucose, Bld: 89 mg/dL (ref 70–99)
Potassium: 4.4 mEq/L (ref 3.5–5.1)
Sodium: 133 mEq/L — ABNORMAL LOW (ref 135–145)
Total Bilirubin: 0.4 mg/dL (ref 0.2–1.2)
Total Protein: 6.9 g/dL (ref 6.0–8.3)

## 2022-02-18 LAB — LDL CHOLESTEROL, DIRECT: Direct LDL: 64 mg/dL

## 2022-02-18 LAB — HEMOGLOBIN A1C: Hgb A1c MFr Bld: 6.5 % (ref 4.6–6.5)

## 2022-02-19 ENCOUNTER — Ambulatory Visit: Payer: PPO | Admitting: Endocrinology

## 2022-02-19 ENCOUNTER — Encounter: Payer: Self-pay | Admitting: Endocrinology

## 2022-02-19 VITALS — BP 132/82 | HR 102 | Ht 62.5 in | Wt 123.2 lb

## 2022-02-19 DIAGNOSIS — E1165 Type 2 diabetes mellitus with hyperglycemia: Secondary | ICD-10-CM

## 2022-02-19 DIAGNOSIS — E7800 Pure hypercholesterolemia, unspecified: Secondary | ICD-10-CM | POA: Diagnosis not present

## 2022-02-20 DIAGNOSIS — G4733 Obstructive sleep apnea (adult) (pediatric): Secondary | ICD-10-CM | POA: Diagnosis not present

## 2022-02-26 ENCOUNTER — Other Ambulatory Visit: Payer: Self-pay | Admitting: Endocrinology

## 2022-02-26 DIAGNOSIS — E118 Type 2 diabetes mellitus with unspecified complications: Secondary | ICD-10-CM

## 2022-03-05 ENCOUNTER — Telehealth: Payer: Self-pay | Admitting: Neurology

## 2022-03-05 NOTE — Telephone Encounter (Signed)
Pt is returning a call from nurse. Pt is requesting a return call

## 2022-03-05 NOTE — Telephone Encounter (Signed)
Pt is calling stating she is experiencing bad headaches and dizziness spell. Pt states SUMAtriptan (IMITREX) 100 MG tablet is working but she has to lay down after taking medication. Pt would like a nurse to give her a call.

## 2022-03-05 NOTE — Telephone Encounter (Signed)
I called pt and LMVM that returning her call.

## 2022-03-06 ENCOUNTER — Other Ambulatory Visit: Payer: Self-pay

## 2022-03-06 DIAGNOSIS — E1165 Type 2 diabetes mellitus with hyperglycemia: Secondary | ICD-10-CM

## 2022-03-06 MED ORDER — BD PEN NEEDLE NANO U/F 32G X 4 MM MISC: 3 refills | Status: AC

## 2022-03-06 NOTE — Telephone Encounter (Signed)
I spoke with the patient. She reports new occipital headache and dizziness that did not get better with the Epley Maneuver. She has h/o vertigo and also is treated for chronic migraine. However she states this particular headache/dizziness is not typical for her. She states her symptoms have been intermittent since last weekend. She currently states her headache and dizziness are not that bad. She denies any weakness and denies any visual or speech symptoms. She denies having recently fallen nor hitting her head. I spoke with Dr Jaynee Eagles. Our recommendation is for the patient to go to the ER for evaluation as soon as possible and ensure there is no stroke or other etiology since this is new. I advised against taking a sumatriptan again at the moment. The patient verbalized understanding, was hesitant about going to the ER, but stated she would talk to her husband about this and she was thankful for the call back.

## 2022-03-12 ENCOUNTER — Ambulatory Visit: Payer: PPO | Admitting: Internal Medicine

## 2022-03-22 DIAGNOSIS — G4733 Obstructive sleep apnea (adult) (pediatric): Secondary | ICD-10-CM | POA: Diagnosis not present

## 2022-04-02 ENCOUNTER — Ambulatory Visit: Payer: PPO | Admitting: Internal Medicine

## 2022-04-03 ENCOUNTER — Telehealth: Payer: PPO

## 2022-04-03 ENCOUNTER — Encounter: Payer: Self-pay | Admitting: Family

## 2022-04-04 DIAGNOSIS — G4733 Obstructive sleep apnea (adult) (pediatric): Secondary | ICD-10-CM | POA: Diagnosis not present

## 2022-04-07 ENCOUNTER — Other Ambulatory Visit: Payer: Self-pay | Admitting: Endocrinology

## 2022-04-08 ENCOUNTER — Telehealth: Payer: Self-pay | Admitting: Adult Health

## 2022-04-08 NOTE — Telephone Encounter (Signed)
The patient's husband left a voice mail asking to push out her botox appointment this week for a month or so because she is starting new medications and that doctor recommended delaying her botox treatment. I called them back and left a voice mail asking them to call the office number tomorrow to reschedule and ask for Jillian to help them.

## 2022-04-09 ENCOUNTER — Ambulatory Visit: Payer: PPO | Admitting: Neurology

## 2022-04-09 NOTE — Telephone Encounter (Signed)
[name] called back, he stated he no longer wanted to push appt out as long as Tamella's new medication is not working for her. Rescheduled pt to 04/30/22 at 2:30 pm.

## 2022-04-10 ENCOUNTER — Ambulatory Visit: Payer: PPO | Admitting: Adult Health

## 2022-04-10 ENCOUNTER — Other Ambulatory Visit: Payer: Self-pay | Admitting: Physician Assistant

## 2022-04-11 ENCOUNTER — Ambulatory Visit (INDEPENDENT_AMBULATORY_CARE_PROVIDER_SITE_OTHER): Payer: PPO | Admitting: Internal Medicine

## 2022-04-11 ENCOUNTER — Encounter: Payer: Self-pay | Admitting: Internal Medicine

## 2022-04-11 DIAGNOSIS — R413 Other amnesia: Secondary | ICD-10-CM

## 2022-04-11 DIAGNOSIS — R7989 Other specified abnormal findings of blood chemistry: Secondary | ICD-10-CM | POA: Diagnosis not present

## 2022-04-11 DIAGNOSIS — F3341 Major depressive disorder, recurrent, in partial remission: Secondary | ICD-10-CM | POA: Diagnosis not present

## 2022-04-11 DIAGNOSIS — E118 Type 2 diabetes mellitus with unspecified complications: Secondary | ICD-10-CM

## 2022-04-11 MED ORDER — VICTOZA 18 MG/3ML ~~LOC~~ SOPN
0.6000 mg | PEN_INJECTOR | Freq: Every day | SUBCUTANEOUS | 5 refills | Status: DC
Start: 2022-04-11 — End: 2022-06-06

## 2022-04-11 NOTE — Assessment & Plan Note (Signed)
Reduce Victoza to 0.6 qd D/c Metformin

## 2022-04-11 NOTE — Assessment & Plan Note (Signed)
No change 

## 2022-04-11 NOTE — Patient Instructions (Addendum)
Reduce Victoza to 0.6 mg  Stop Metformin  ?? Theodore Demark Stimulator for depression

## 2022-04-11 NOTE — Progress Notes (Signed)
Subjective:  Patient ID: [name], female    DOB: 14-Jan-1941  Age: 81 y.o. MRN: [id number]  CC: No chief complaint on file.   HPI [name] presents for wt loss Depression is worse Mirtazapine made her worse - she stopped  Outpatient Medications Prior to Visit  Medication Sig Dispense Refill   acetaminophen (TYLENOL) 325 MG tablet Take 325 mg by mouth 2 (two) times daily as needed. Takes with tramadol     aspirin EC 81 MG tablet Take 1 tablet (81 mg total) by mouth daily. 100 tablet 3   BD PEN NEEDLE NANO U/F 32G X 4 MM MISC USE AS DIRECTED TWICE DAILY. 200 each 3   Blood Glucose Monitoring Suppl ([place]) w/Device KIT Use as instructed to check blood sugar 5 times daily. 1 kit 0   busPIRone (BUSPAR) 15 MG tablet Take 1 tablet by mouth 2 (two) times daily.     cetirizine (ZYRTEC) 10 MG tablet Take 10 mg by mouth daily as needed for allergies.     denosumab (PROLIA) 60 MG/ML SOSY injection Inject 60 mg into the skin every 6 (six) months.     famotidine (PEPCID) 20 MG tablet TAKE 1 TABLET (20 MG TOTAL) BY MOUTH AT BEDTIME. **PLEASE CALL THE OFFICE TO SCHEDULE A FOLLOW UP 90 tablet 0   L-Methylfolate (DEPLIN) 15 MG TABS Take 15 mg by mouth daily. For treatment of depression     Lancet Devices (ONE TOUCH DELICA LANCING DEV) MISC Use daily as directed 1 each 0   Liniments (SALONPAS PAIN RELIEF PATCH EX) Apply 1 patch topically at bedtime.     LORazepam (ATIVAN) 1 MG tablet Take 1 mg by mouth 4 (four) times daily as needed for anxiety. Reports to taking 1/2 tablet at breakfast and lunch and 1 tablet at bedtime  2   ONETOUCH DELICA LANCETS 62X MISC USE UP TO 4 TIMES A DAY 200 each 3   ONETOUCH VERIO test strip Use as instructed to check blood sugar once a day 100 each 3   pantoprazole (PROTONIX) 40 MG tablet Take 1 tablet (40 mg total) by mouth 2 (two) times daily. **PLEASE CALL OFFICE TO SCHEDULE FOLLOW UP 180 tablet 0   pioglitazone (ACTOS) 30 MG tablet TAKE 1  TABLET BY MOUTH EVERY DAY 90 tablet 1   repaglinide (PRANDIN) 1 MG tablet TAKE 2 TABLETS BY MOUTH BEFORE BREAKFAST, 1 TABLET AT LUNCH AND 1 TABLET AT DINNER 360 tablet 1   senna-docusate (SENOKOT-S) 8.6-50 MG tablet Take 2 tablets by mouth 2 (two) times daily. 100 tablet 6   SUMAtriptan (IMITREX) 100 MG tablet Take 1 tablet (100 mg total) by mouth every 2 (two) hours as needed. Max twice a day. 9 tablet 11   traMADol (ULTRAM) 50 MG tablet TAKE 1 TABLET (50 MG TOTAL) BY MOUTH EVERY 12 (TWELVE) HOURS AS NEEDED FOR SEVERE PAIN. 180 tablet 1   TRINTELLIX 20 MG TABS Take 10 mg by mouth daily. Take 10 mg daily  2   VOLTAREN 1 % GEL APPLY 2 GRAMS TO KNEE 2-3 TIMES DAILY PRN  1   metFORMIN (GLUCOPHAGE) 500 MG tablet TAKE 1 TABLET BY MOUTH IN THE MORNING AND IN THE EVENING 180 tablet 1   VICTOZA 18 MG/3ML SOPN INJECT 1.2MG INTO MUSCLE DAILY 3 mL 2   BOOSTRIX 5-2.5-18.5 LF-MCG/0.5 injection  (Patient not taking: Reported on 04/11/2022)     mirtazapine (REMERON) 30 MG tablet Take by mouth. (Patient not  taking: Reported on 04/11/2022)     PFIZER COVID-19 VAC BIVALENT injection Inject 0.3 mLs into the muscle once.     PREVNAR 20 0.5 ML injection Inject 0.5 mLs into the muscle once.     SHINGRIX injection  (Patient not taking: Reported on 04/11/2022)     Multiple Vitamins-Minerals (MULTIVITAMIN,TX-MINERALS) tablet Take 1 tablet by mouth daily. (Patient not taking: Reported on 02/19/2022)     No facility-administered medications prior to visit.    ROS: Review of Systems  Constitutional:  Positive for fatigue. Negative for activity change, appetite change, chills and unexpected weight change.  HENT:  Negative for congestion, mouth sores and sinus pressure.   Eyes:  Negative for visual disturbance.  Respiratory:  Negative for cough and chest tightness.   Gastrointestinal:  Positive for diarrhea. Negative for abdominal pain and nausea.  Genitourinary:  Negative for difficulty urinating, frequency and vaginal  pain.  Musculoskeletal:  Negative for back pain and gait problem.  Skin:  Negative for pallor and rash.  Neurological:  Positive for dizziness and weakness. Negative for tremors, numbness and headaches.  Psychiatric/Behavioral:  Positive for decreased concentration and dysphoric mood. Negative for confusion, sleep disturbance and suicidal ideas. The patient is nervous/anxious.     Objective:  BP 120/70 (BP Location: Left Arm, Patient Position: Sitting, Cuff Size: Large)   Pulse 93   Temp 98.2 F (36.8 C) (Oral)   Ht 5' 2.5" (1.588 m)   Wt 123 lb (55.8 kg)   SpO2 96%   BMI 22.14 kg/m   BP Readings from Last 3 Encounters:  04/11/22 120/70  02/19/22 132/82  02/05/22 (!) 141/83    Wt Readings from Last 3 Encounters:  04/11/22 123 lb (55.8 kg)  02/19/22 123 lb 3.2 oz (55.9 kg)  12/10/21 121 lb (54.9 kg)    Physical Exam Constitutional:      General: She is not in acute distress.    Appearance: Normal appearance. She is well-developed.  HENT:     Head: Normocephalic.     Right Ear: External ear normal.     Left Ear: External ear normal.     Nose: Nose normal.  Eyes:     General:        Right eye: No discharge.        Left eye: No discharge.     Conjunctiva/sclera: Conjunctivae normal.     Pupils: Pupils are equal, round, and reactive to light.  Neck:     Thyroid: No thyromegaly.     Vascular: No JVD.     Trachea: No tracheal deviation.  Cardiovascular:     Rate and Rhythm: Normal rate and regular rhythm.     Heart sounds: Normal heart sounds.  Pulmonary:     Effort: No respiratory distress.     Breath sounds: No stridor. No wheezing.  Abdominal:     General: Bowel sounds are normal. There is no distension.     Palpations: Abdomen is soft. There is no mass.     Tenderness: There is no abdominal tenderness. There is no guarding or rebound.  Musculoskeletal:        General: No tenderness.     Cervical back: Normal range of motion and neck supple. No rigidity.   Lymphadenopathy:     Cervical: No cervical adenopathy.  Skin:    Findings: No erythema or rash.  Neurological:     Cranial Nerves: No cranial nerve deficit.     Motor: No abnormal muscle tone.  Coordination: Coordination normal.     Deep Tendon Reflexes: Reflexes normal.  Psychiatric:        Behavior: Behavior normal.        Thought Content: Thought content normal.        Judgment: Judgment normal.     Lab Results  Component Value Date   WBC 6.9 06/21/2021   HGB 11.6 (L) 06/21/2021   HCT 35.6 (L) 06/21/2021   PLT 400.0 06/21/2021   GLUCOSE 89 02/18/2022   CHOL 116 05/10/2020   TRIG 53.0 05/10/2020   HDL 71.30 05/10/2020   LDLDIRECT 64.0 02/18/2022   LDLCALC 34 05/10/2020   ALT 13 02/18/2022   AST 21 02/18/2022   NA 133 (L) 02/18/2022   K 4.4 02/18/2022   CL 98 02/18/2022   CREATININE 0.98 02/18/2022   BUN 14 02/18/2022   CO2 28 02/18/2022   TSH 0.89 12/10/2021   INR 0.93 11/02/2013   HGBA1C 6.5 02/18/2022   MICROALBUR 1.4 09/18/2020    CT Abdomen Pelvis W Contrast  Result Date: 06/28/2021 CLINICAL DATA:  Epigastric pain.  Nausea and vomiting. EXAM: CT ABDOMEN AND PELVIS WITH CONTRAST TECHNIQUE: Multidetector CT imaging of the abdomen and pelvis was performed using the standard protocol following bolus administration of intravenous contrast. CONTRAST:  40m OMNIPAQUE IOHEXOL 350 MG/ML SOLN COMPARISON:  09/01/2018 FINDINGS: Lower chest: Few tiny nodular densities in the right lower lobe. 3 mm nodule in the right lower lobe on sequence 3 image 4 is stable since 2020. No pleural effusions. Hepatobiliary: Cholecystectomy. Normal appearance of the liver. No suspicious liver lesion. No significant biliary dilatation. Portal venous system is patent. Pancreas: Pancreas is poorly characterized and likely related to fatty infiltration. No evidence for duct dilatation or inflammation. 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 Adrenals/Urinary Tract: Normal appearance of the adrenal glands.  Normal appearance of the urinary bladder. Fullness in left renal pelvis could represent an extrarenal pelvis. Negative for hydronephrosis. No suspicious renal lesion. Stomach/Bowel: Small hiatal hernia. Moderate to large amount of stool throughout the colon. Oral contrast throughout the colon. No evidence for an obstruction. Normal appendix. Vascular/Lymphatic: Aortic atherosclerosis. No enlarged abdominal or pelvic lymph nodes. Reproductive: Status post hysterectomy. No adnexal masses. Soft tissue fullness in the vaginal region. Other: Negative for free fluid. Negative for free air. Tiny umbilical hernia containing fat. Musculoskeletal: Skin or superficial soft tissue thickening / edema in the left lower abdomen. Findings are nonspecific. Degenerative changes at the symphysis pubis. Surgical fixation of the right SI joint. Severe disc space narrowing with endplate changes at LS8-N4 Mild anterolisthesis at L5-S1. IMPRESSION: 1. No acute abnormality in the abdomen or pelvis. 2. Moderate to large amount of stool in the colon. No evidence for a bowel obstruction. 3. Small hiatal hernia. Electronically Signed   By: AMarkus DaftM.D.   On: 06/28/2021 16:59    Assessment & Plan:   Problem List Items Addressed This Visit     Depression    Chronic  On Trintellix, Buspar  Follow-up with Dr KToy Care  They were discussing the option of ECT treatments. The patient was encouraged to start doing small chores around the house with her husband. We will reduce the dosage of her meds that potentially can make her tired, cause nausea  I will ask Dr.Kaur about FTheodore DemarkStimulator for depression      Relevant Medications   mirtazapine (REMERON) 30 MG tablet   DM type 2, controlled, with complication (HCC)    Reduce Victoza to 0.6 qd D/c  Metformin      Relevant Medications   liraglutide (VICTOZA) 18 MG/3ML SOPN   Elevated liver function tests    Monitor LFTs We may need to d/c Actos      Memory problem     No change         Meds ordered this encounter  Medications   liraglutide (VICTOZA) 18 MG/3ML SOPN    Sig: Inject 0.6 mg into the skin daily.    Dispense:  3 mL    Refill:  5      Follow-up: Return in about 2 weeks (around 04/25/2022) for a follow-up visit.  Walker Kehr, MD

## 2022-04-11 NOTE — Assessment & Plan Note (Signed)
Monitor LFTs We may need to d/c Actos

## 2022-04-22 DIAGNOSIS — L718 Other rosacea: Secondary | ICD-10-CM | POA: Diagnosis not present

## 2022-04-22 DIAGNOSIS — L821 Other seborrheic keratosis: Secondary | ICD-10-CM | POA: Diagnosis not present

## 2022-04-22 DIAGNOSIS — G4733 Obstructive sleep apnea (adult) (pediatric): Secondary | ICD-10-CM | POA: Diagnosis not present

## 2022-04-22 DIAGNOSIS — L720 Epidermal cyst: Secondary | ICD-10-CM | POA: Diagnosis not present

## 2022-04-22 DIAGNOSIS — I788 Other diseases of capillaries: Secondary | ICD-10-CM | POA: Diagnosis not present

## 2022-04-22 NOTE — Assessment & Plan Note (Addendum)
Chronic  On Trintellix, Buspar  Follow-up with Dr Toy Care.  They were discussing the option of ECT treatments. The patient was encouraged to start doing small chores around the house with her husband. We will reduce the dosage of her meds that potentially can make her tired, cause nausea  I will ask Dr.Kaur about Theodore Demark Stimulator for depression

## 2022-04-24 ENCOUNTER — Ambulatory Visit (INDEPENDENT_AMBULATORY_CARE_PROVIDER_SITE_OTHER): Payer: PPO | Admitting: Internal Medicine

## 2022-04-24 ENCOUNTER — Telehealth: Payer: Self-pay

## 2022-04-24 ENCOUNTER — Ambulatory Visit (INDEPENDENT_AMBULATORY_CARE_PROVIDER_SITE_OTHER): Payer: PPO

## 2022-04-24 ENCOUNTER — Encounter: Payer: Self-pay | Admitting: Internal Medicine

## 2022-04-24 VITALS — BP 120/68 | HR 92 | Temp 98.2°F | Ht 62.5 in | Wt 123.5 lb

## 2022-04-24 DIAGNOSIS — F3341 Major depressive disorder, recurrent, in partial remission: Secondary | ICD-10-CM | POA: Diagnosis not present

## 2022-04-24 DIAGNOSIS — F411 Generalized anxiety disorder: Secondary | ICD-10-CM | POA: Diagnosis not present

## 2022-04-24 DIAGNOSIS — R1013 Epigastric pain: Secondary | ICD-10-CM | POA: Diagnosis not present

## 2022-04-24 DIAGNOSIS — Z0000 Encounter for general adult medical examination without abnormal findings: Secondary | ICD-10-CM

## 2022-04-24 DIAGNOSIS — E118 Type 2 diabetes mellitus with unspecified complications: Secondary | ICD-10-CM | POA: Diagnosis not present

## 2022-04-24 DIAGNOSIS — R413 Other amnesia: Secondary | ICD-10-CM | POA: Diagnosis not present

## 2022-04-24 DIAGNOSIS — R42 Dizziness and giddiness: Secondary | ICD-10-CM

## 2022-04-24 NOTE — Assessment & Plan Note (Signed)
On prn Xanax  Potential benefits of a long term benzodiazepines  use as well as potential risks  and complications were explained to the patient and were aknowledged. Stay more active

## 2022-04-24 NOTE — Progress Notes (Signed)
Subjective:  Patient ID: [name], female    DOB: 01/15/1941  Age: 81 y.o. MRN: [id number]  CC: Follow-up (2 week f/u)   HPI ARYN SAFRAN presents for depression, weakness, nausea: feeling much better w/lesser dose of Victoza and off Metformin, Remeron.   Outpatient Medications Prior to Visit  Medication Sig Dispense Refill   acetaminophen (TYLENOL) 325 MG tablet Take 325 mg by mouth 2 (two) times daily as needed. Takes with tramadol     aspirin EC 81 MG tablet Take 1 tablet (81 mg total) by mouth daily. 100 tablet 3   BD PEN NEEDLE NANO U/F 32G X 4 MM MISC USE AS DIRECTED TWICE DAILY. 200 each 3   Blood Glucose Monitoring Suppl ([place]) w/Device KIT Use as instructed to check blood sugar 5 times daily. 1 kit 0   busPIRone (BUSPAR) 15 MG tablet Take 1 tablet by mouth 2 (two) times daily.     cetirizine (ZYRTEC) 10 MG tablet Take 10 mg by mouth daily as needed for allergies.     denosumab (PROLIA) 60 MG/ML SOSY injection Inject 60 mg into the skin every 6 (six) months.     famotidine (PEPCID) 20 MG tablet TAKE 1 TABLET (20 MG TOTAL) BY MOUTH AT BEDTIME. **PLEASE CALL THE OFFICE TO SCHEDULE A FOLLOW UP 90 tablet 0   L-Methylfolate (DEPLIN) 15 MG TABS Take 15 mg by mouth daily. For treatment of depression     Lancet Devices (ONE TOUCH DELICA LANCING DEV) MISC Use daily as directed 1 each 0   Liniments (SALONPAS PAIN RELIEF PATCH EX) Apply 1 patch topically at bedtime.     liraglutide (VICTOZA) 18 MG/3ML SOPN Inject 0.6 mg into the skin daily. 3 mL 5   LORazepam (ATIVAN) 1 MG tablet Take 1 mg by mouth 4 (four) times daily as needed for anxiety. Reports to taking 1/2 tablet at breakfast and lunch and 1 tablet at bedtime  2   ONETOUCH DELICA LANCETS 62B MISC USE UP TO 4 TIMES A DAY 200 each 3   ONETOUCH VERIO test strip Use as instructed to check blood sugar once a day 100 each 3   pantoprazole (PROTONIX) 40 MG tablet Take 1 tablet (40 mg total) by mouth 2 (two)  times daily. **PLEASE CALL OFFICE TO SCHEDULE FOLLOW UP 180 tablet 0   pioglitazone (ACTOS) 30 MG tablet TAKE 1 TABLET BY MOUTH EVERY DAY 90 tablet 1   repaglinide (PRANDIN) 1 MG tablet TAKE 2 TABLETS BY MOUTH BEFORE BREAKFAST, 1 TABLET AT LUNCH AND 1 TABLET AT DINNER 360 tablet 1   senna-docusate (SENOKOT-S) 8.6-50 MG tablet Take 2 tablets by mouth 2 (two) times daily. 100 tablet 6   SHINGRIX injection      SUMAtriptan (IMITREX) 100 MG tablet Take 1 tablet (100 mg total) by mouth every 2 (two) hours as needed. Max twice a day. 9 tablet 11   traMADol (ULTRAM) 50 MG tablet TAKE 1 TABLET (50 MG TOTAL) BY MOUTH EVERY 12 (TWELVE) HOURS AS NEEDED FOR SEVERE PAIN. 180 tablet 1   TRINTELLIX 20 MG TABS Take 10 mg by mouth daily. Take 10 mg daily  2   VOLTAREN 1 % GEL APPLY 2 GRAMS TO KNEE 2-3 TIMES DAILY PRN  1   BOOSTRIX 5-2.5-18.5 LF-MCG/0.5 injection  (Patient not taking: Reported on 04/11/2022)     mirtazapine (REMERON) 30 MG tablet Take by mouth. (Patient not taking: Reported on 04/11/2022)     [place] COVID-19  VAC BIVALENT injection Inject 0.3 mLs into the muscle once. (Patient not taking: Reported on 04/24/2022)     PREVNAR 20 0.5 ML injection Inject 0.5 mLs into the muscle once. (Patient not taking: Reported on 04/24/2022)     No facility-administered medications prior to visit.    ROS: Review of Systems  Objective:  BP 120/68 (BP Location: Left Arm)   Pulse 92   Temp 98.2 F (36.8 C) (Oral)   Ht 5' 2.5" (1.588 m)   Wt 123 lb 6.4 oz (56 kg)   SpO2 96%   BMI 22.21 kg/m   BP Readings from Last 3 Encounters:  04/24/22 120/68  04/11/22 120/70  02/19/22 132/82    Wt Readings from Last 3 Encounters:  04/24/22 123 lb 6.4 oz (56 kg)  04/11/22 123 lb (55.8 kg)  02/19/22 123 lb 3.2 oz (55.9 kg)    Physical Exam  Lab Results  Component Value Date   WBC 6.9 06/21/2021   HGB 11.6 (L) 06/21/2021   HCT 35.6 (L) 06/21/2021   PLT 400.0 06/21/2021   GLUCOSE 89 02/18/2022   CHOL 116  05/10/2020   TRIG 53.0 05/10/2020   HDL 71.30 05/10/2020   LDLDIRECT 64.0 02/18/2022   LDLCALC 34 05/10/2020   ALT 13 02/18/2022   AST 21 02/18/2022   NA 133 (L) 02/18/2022   K 4.4 02/18/2022   CL 98 02/18/2022   CREATININE 0.98 02/18/2022   BUN 14 02/18/2022   CO2 28 02/18/2022   TSH 0.89 12/10/2021   INR 0.93 11/02/2013   HGBA1C 6.5 02/18/2022   MICROALBUR 1.4 09/18/2020    CT Abdomen Pelvis W Contrast  Result Date: 06/28/2021 CLINICAL DATA:  Epigastric pain.  Nausea and vomiting. EXAM: CT ABDOMEN AND PELVIS WITH CONTRAST TECHNIQUE: Multidetector CT imaging of the abdomen and pelvis was performed using the standard protocol following bolus administration of intravenous contrast. CONTRAST:  54m OMNIPAQUE IOHEXOL 350 MG/ML SOLN COMPARISON:  09/01/2018 FINDINGS: Lower chest: Few tiny nodular densities in the right lower lobe. 3 mm nodule in the right lower lobe on sequence 3 image 4 is stable since 2020. No pleural effusions. Hepatobiliary: Cholecystectomy. Normal appearance of the liver. No suspicious liver lesion. No significant biliary dilatation. Portal venous system is patent. Pancreas: Pancreas is poorly characterized and likely related to fatty infiltration. No evidence for duct dilatation or inflammation. 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 Adrenals/Urinary Tract: Normal appearance of the adrenal glands. Normal appearance of the urinary bladder. Fullness in left renal pelvis could represent an extrarenal pelvis. Negative for hydronephrosis. No suspicious renal lesion. Stomach/Bowel: Small hiatal hernia. Moderate to large amount of stool throughout the colon. Oral contrast throughout the colon. No evidence for an obstruction. Normal appendix. Vascular/Lymphatic: Aortic atherosclerosis. No enlarged abdominal or pelvic lymph nodes. Reproductive: Status post hysterectomy. No adnexal masses. Soft tissue fullness in the vaginal region. Other: Negative for free fluid. Negative for free air. Tiny  umbilical hernia containing fat. Musculoskeletal: Skin or superficial soft tissue thickening / edema in the left lower abdomen. Findings are nonspecific. Degenerative changes at the symphysis pubis. Surgical fixation of the right SI joint. Severe disc space narrowing with endplate changes at LX0-R6 Mild anterolisthesis at L5-S1. IMPRESSION: 1. No acute abnormality in the abdomen or pelvis. 2. Moderate to large amount of stool in the colon. No evidence for a bowel obstruction. 3. Small hiatal hernia. Electronically Signed   By: AMarkus DaftM.D.   On: 06/28/2021 16:59    Assessment & Plan:   Problem  List Items Addressed This Visit     Anxiety state    On prn Xanax  Potential benefits of a long term benzodiazepines  use as well as potential risks  and complications were explained to the patient and were aknowledged. Stay more active      Depression    Feeling much better w/lesser dose of Victoza and off Metformin, Remeron.  Ytzel will ask Dr.Kaur about Reather Littler for depression. Follow-up with Dr Toy Care.  They were discussing the option of ECT treatments.      DM type 2, controlled, with complication (HCC)    Weakness, nausea: feeling much better w/lesser dose of Victoza and off Metformin, Remeron.       Epigastric pain    Pain: feeling much better w/lesser dose of Victoza and off Metformin, Remeron.       Memory problem    Focus: feeling much better w/lesser dose of Victoza and off Metformin, Remeron. On Lion's mane       Vertigo    Dizziness: feeling much better w/lesser dose of Victoza and off Metformin, Remeron.          No orders of the defined types were placed in this encounter.     Follow-up: Return in about 6 weeks (around 06/05/2022) for a follow-up visit.  Walker Kehr, MD

## 2022-04-24 NOTE — Assessment & Plan Note (Signed)
Weakness, nausea: feeling much better w/lesser dose of Victoza and off Metformin, Remeron.

## 2022-04-24 NOTE — Assessment & Plan Note (Addendum)
Feeling much better w/lesser dose of Victoza and off Metformin, Remeron.  [name] will ask Dr.Kaur about Reather Littler for depression. Follow-up with Dr Toy Care.  They were discussing the option of ECT treatments.

## 2022-04-24 NOTE — Assessment & Plan Note (Signed)
Pain: feeling much better w/lesser dose of Victoza and off Metformin, Remeron.
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
## 2022-04-24 NOTE — Patient Instructions (Signed)
[name] , Thank you for taking time to come for your Medicare Wellness Visit. I appreciate your ongoing commitment to your health goals. Please review the following plan we discussed and let me know if I can assist you in the future.   Screening recommendations/referrals: Colonoscopy: Discontinued due to age 81: 06/12/2021; due every year Bone Density: 06/07/2020; due every 2-3 years Recommended yearly ophthalmology/optometry visit for glaucoma screening and checkup Recommended yearly dental visit for hygiene and checkup  Vaccinations: Influenza vaccine: 05/10/2021; due Fall Season 2023 Pneumococcal vaccine: 08/04/2013, 06/24/2017 Tdap vaccine: 07/01/2012; due every 10 years Shingles vaccine: 05/22/2021, 02/01/2022   Covid-19: 09/15/2019, 10/06/2019, 10/18/52021, 12/14/2020  Advanced directives: Yes; Please bring a copy of your health care power of attorney and living will to the office at your convenience.  Conditions/risks identified: Yes; To increase my physical activity.  Next appointment: Please schedule your next Medicare Wellness Visit with your Nurse Health Advisor in 1 year by calling [phone].   Preventive Care 73 Years and Older, Female Preventive care refers to lifestyle choices and visits with your health care provider that can promote health and wellness. What does preventive care include? A yearly physical exam. This is also called an annual well check. Dental exams once or twice a year. Routine eye exams. Ask your health care provider how often you should have your eyes checked. Personal lifestyle choices, including: Daily care of your teeth and gums. Regular physical activity. Eating a healthy diet. Avoiding tobacco and drug use. Limiting alcohol use. Practicing safe sex. Taking low-dose aspirin every day. Taking vitamin and mineral supplements as recommended by your health care provider. What happens during an annual well check? The services and  screenings done by your health care provider during your annual well check will depend on your age, overall health, lifestyle risk factors, and family history of disease. Counseling  Your health care provider may ask you questions about your: Alcohol use. Tobacco use. Drug use. Emotional well-being. Home and relationship well-being. Sexual activity. Eating habits. History of falls. Memory and ability to understand (cognition). Work and work Statistician. Reproductive health. Screening  You may have the following tests or measurements: Height, weight, and BMI. Blood pressure. Lipid and cholesterol levels. These may be checked every 5 years, or more frequently if you are over 34 years old. Skin check. Lung cancer screening. You may have this screening every year starting at age 72 if you have a 30-pack-year history of smoking and currently smoke or have quit within the past 15 years. Fecal occult blood test (FOBT) of the stool. You may have this test every year starting at age 50. Flexible sigmoidoscopy or colonoscopy. You may have a sigmoidoscopy every 5 years or a colonoscopy every 10 years starting at age 11. Hepatitis C blood test. Hepatitis B blood test. Sexually transmitted disease (STD) testing. Diabetes screening. This is done by checking your blood sugar (glucose) after you have not eaten for a while (fasting). You may have this done every 1-3 years. Bone density scan. This is done to screen for osteoporosis. You may have this done starting at age 43. Mammogram. This may be done every 1-2 years. Talk to your health care provider about how often you should have regular mammograms. Talk with your health care provider about your test results, treatment options, and if necessary, the need for more tests. Vaccines  Your health care provider may recommend certain vaccines, such as: Influenza vaccine. This is recommended every year. Tetanus, diphtheria, and acellular pertussis (  Tdap,  Td) vaccine. You may need a Td booster every 10 years. Zoster vaccine. You may need this after age 52. Pneumococcal 13-valent conjugate (PCV13) vaccine. One dose is recommended after age 85. Pneumococcal polysaccharide (PPSV23) vaccine. One dose is recommended after age 51. Talk to your health care provider about which screenings and vaccines you need and how often you need them. This information is not intended to replace advice given to you by your health care provider. Make sure you discuss any questions you have with your health care provider. Document Released: 09/08/2015 Document Revised: 05/01/2016 Document Reviewed: 06/13/2015 Elsevier Interactive Patient Education  2017 [place] Prevention in the Home Falls can cause injuries. They can happen to people of all ages. There are many things you can do to make your home safe and to help prevent falls. What can I do on the outside of my home? Regularly fix the edges of walkways and driveways and fix any cracks. Remove anything that might make you trip as you walk through a door, such as a raised step or threshold. Trim any bushes or trees on the path to your home. Use bright outdoor lighting. Clear any walking paths of anything that might make someone trip, such as rocks or tools. Regularly check to see if handrails are loose or broken. Make sure that both sides of any steps have handrails. Any raised decks and porches should have guardrails on the edges. Have any leaves, snow, or ice cleared regularly. Use sand or salt on walking paths during winter. Clean up any spills in your garage right away. This includes oil or grease spills. What can I do in the bathroom? Use night lights. Install grab bars by the toilet and in the tub and shower. Do not use towel bars as grab bars. Use non-skid mats or decals in the tub or shower. If you need to sit down in the shower, use a plastic, non-slip stool. Keep the floor dry. Clean up any  water that spills on the floor as soon as it happens. Remove soap buildup in the tub or shower regularly. Attach bath mats securely with double-sided non-slip rug tape. Do not have throw rugs and other things on the floor that can make you trip. What can I do in the bedroom? Use night lights. Make sure that you have a light by your bed that is easy to reach. Do not use any sheets or blankets that are too big for your bed. They should not hang down onto the floor. Have a firm chair that has side arms. You can use this for support while you get dressed. Do not have throw rugs and other things on the floor that can make you trip. What can I do in the kitchen? Clean up any spills right away. Avoid walking on wet floors. Keep items that you use a lot in easy-to-reach places. If you need to reach something above you, use a strong step stool that has a grab bar. Keep electrical cords out of the way. Do not use floor polish or wax that makes floors slippery. If you must use wax, use non-skid floor wax. Do not have throw rugs and other things on the floor that can make you trip. What can I do with my stairs? Do not leave any items on the stairs. Make sure that there are handrails on both sides of the stairs and use them. Fix handrails that are broken or loose. Make sure that handrails are  as long as the stairways. Check any carpeting to make sure that it is firmly attached to the stairs. Fix any carpet that is loose or worn. Avoid having throw rugs at the top or bottom of the stairs. If you do have throw rugs, attach them to the floor with carpet tape. Make sure that you have a light switch at the top of the stairs and the bottom of the stairs. If you do not have them, ask someone to add them for you. What else can I do to help prevent falls? Wear shoes that: Do not have high heels. Have rubber bottoms. Are comfortable and fit you well. Are closed at the toe. Do not wear sandals. If you use a  stepladder: Make sure that it is fully opened. Do not climb a closed stepladder. Make sure that both sides of the stepladder are locked into place. Ask someone to hold it for you, if possible. Clearly mark and make sure that you can see: Any grab bars or handrails. First and last steps. Where the edge of each step is. Use tools that help you move around (mobility aids) if they are needed. These include: Canes. Walkers. Scooters. Crutches. Turn on the lights when you go into a dark area. Replace any light bulbs as soon as they burn out. Set up your furniture so you have a clear path. Avoid moving your furniture around. If any of your floors are uneven, fix them. If there are any pets around you, be aware of where they are. Review your medicines with your doctor. Some medicines can make you feel dizzy. This can increase your chance of falling. Ask your doctor what other things that you can do to help prevent falls. This information is not intended to replace advice given to you by your health care provider. Make sure you discuss any questions you have with your health care provider. Document Released: 06/08/2009 Document Revised: 01/18/2016 Document Reviewed: 09/16/2014 Elsevier Interactive Patient Education  2017 [employer].

## 2022-04-24 NOTE — Assessment & Plan Note (Signed)
Dizziness: feeling much better w/lesser dose of Victoza and off Metformin, Remeron.

## 2022-04-24 NOTE — Telephone Encounter (Signed)
I called [facility] [phone] and spoke to Brandon.  She states that J0585 and [phone] do not require pre-certification. There will be a 20% co-insurance applied with a $20 copay.  Ref# for this call is [id number].

## 2022-04-24 NOTE — Assessment & Plan Note (Addendum)
Focus: feeling much better w/lesser dose of Victoza and off Metformin, Remeron. On Lion's mane

## 2022-04-29 ENCOUNTER — Other Ambulatory Visit: Payer: Self-pay | Admitting: Physician Assistant

## 2022-04-30 ENCOUNTER — Ambulatory Visit: Payer: PPO | Admitting: Adult Health

## 2022-04-30 DIAGNOSIS — G43709 Chronic migraine without aura, not intractable, without status migrainosus: Secondary | ICD-10-CM | POA: Diagnosis not present

## 2022-04-30 MED ORDER — ONABOTULINUMTOXINA 200 UNITS IJ SOLR
155.0000 [IU] | Freq: Once | INTRAMUSCULAR | Status: AC
  Administered 2022-04-30: 155 [IU] via INTRAMUSCULAR

## 2022-04-30 NOTE — Progress Notes (Signed)
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

## 2022-04-30 NOTE — Progress Notes (Signed)
    04/30/22: stable   BOTOX PROCEDURE NOTE FOR MIGRAINE HEADACHE    Contraindications and precautions discussed with patient(above). Aseptic procedure was observed and patient tolerated procedure. Procedure performed by Ward Givens, NP  The condition has existed for more than 6 months, and pt does not have a diagnosis of ALS, Myasthenia Gravis or Lambert-Eaton Syndrome.  Risks and benefits of injections discussed and pt agrees to proceed with the procedure.  Written consent obtained   Indication/Diagnosis: chronic migraine BOTOX(J0585) injection was performed according to protocol by [employer]. 200 units of BOTOX was dissolved into 4 cc NS.   NDC: [address]-67  Type of toxin: Botox Botox- 200 units x 1 vial Lot: [id number] Expiration: 09/2024 NDC: 0962-8366-29   Bacteriostatic 0.9% Sodium Chloride- 47m total Lot: [id number] Expiration: 03/27/2023 NDC: [address]-35 Description of procedure:  The patient was placed in a sitting position. The standard protocol was used for Botox as follows, with 5 units of Botox injected at each site:   -Procerus muscle, midline injection  -Corrugator muscle, bilateral injection  -Frontalis muscle, bilateral injection, with 2 sites each side, medial injection was performed in the upper one third of the frontalis muscle, in the region vertical from the medial inferior edge of the superior orbital rim. The lateral injection was again in the upper one third of the forehead vertically above the lateral limbus of the cornea, 1.5 cm lateral to the medial injection site.  -Temporalis muscle injection, 4 sites, bilaterally. The first injection was 3 cm above the tragus of the ear, second injection site was 1.5 cm to 3 cm up from the first injection site in line with the tragus of the ear. The third injection site was 1.5-3 cm forward between the first 2 injection sites. The fourth injection site was 1.5 cm posterior to the second injection  site.  -Occipitalis muscle injection, 3 sites, bilaterally. The first injection was done one half way between the occipital protuberance and the tip of the mastoid process behind the ear. The second injection site was done lateral and superior to the first, 1 fingerbreadth from the first injection. The third injection site was 1 fingerbreadth superiorly and medially from the first injection site.  -Cervical paraspinal muscle injection, 2 sites, bilateral knee first injection site was 1 cm from the midline of the cervical spine, 3 cm inferior to the lower border of the occipital protuberance. The second injection site was 1.5 cm superiorly and laterally to the first injection site.  -Trapezius muscle injection was performed at 3 sites, bilaterally. The first injection site was in the upper trapezius muscle halfway between the inflection point of the neck, and the acromion. The second injection site was one half way between the acromion and the first injection site. The third injection was done between the first injection site and the inflection point of the neck.   Will return for repeat injection in 3 months.   A 200 units of Botox was used, 155 units were injected, the rest of the Botox was wasted. The patient tolerated the procedure well, there were no complications of the above procedure.  MWard Givens MSN, NP-C 04/30/2022, 2:48 PM [facility] [address] [place] [phone]

## 2022-05-23 DIAGNOSIS — G4733 Obstructive sleep apnea (adult) (pediatric): Secondary | ICD-10-CM | POA: Diagnosis not present

## 2022-05-27 ENCOUNTER — Encounter: Payer: Self-pay | Admitting: Neurology

## 2022-05-27 ENCOUNTER — Telehealth: Payer: Self-pay | Admitting: Adult Health

## 2022-05-27 NOTE — Telephone Encounter (Signed)
If it worked I can prescribe it if they are amendable to that?

## 2022-05-27 NOTE — Telephone Encounter (Signed)
Will forward to Megan,NP to review chart and see if ok to give ubrevly samples.

## 2022-05-27 NOTE — Telephone Encounter (Signed)
Pt's spouse states like back in March, pt has had 4 days of bad headaches.  Husband would like to know if pt is able to get more samples of Ubrelvy to hold pt over until her next Botox.

## 2022-05-28 MED ORDER — UBRELVY 100 MG PO TABS: ORAL_TABLET | ORAL | 11 refills | Status: DC

## 2022-05-28 NOTE — Addendum Note (Signed)
Addended by: Trudie Buckler on: 05/28/2022 10:42 AM   Modules accepted: Orders

## 2022-05-28 NOTE — Telephone Encounter (Signed)
Spoke to patient agrees to [place] prescription. Pt states head still hurting .Asked patient if this was normal patient states yes .Pt had botox a  month ago . Informed patient we will forward Jinny Blossom ,NP note so we can get prescription in  today .informed patient if she has any new symptoms please go to ED or Urgent care to get evaluated.  Pt expressed understanding and thanked me for calling.

## 2022-05-28 NOTE — Telephone Encounter (Signed)
See phone note

## 2022-05-30 ENCOUNTER — Telehealth: Payer: Self-pay | Admitting: *Deleted

## 2022-05-30 ENCOUNTER — Telehealth: Payer: Self-pay

## 2022-05-30 NOTE — Telephone Encounter (Signed)
PA has been sent to [facility] [employer] via [place]. Awaiting determination. KEY: [id number]

## 2022-05-30 NOTE — Telephone Encounter (Signed)
Scheduling aware to contact patient to set up Dexa scan

## 2022-05-30 NOTE — Telephone Encounter (Addendum)
Based on last year summary of benefits $0 cost for patient.  No PA needed per Medicare rep reference # Oda Kilts 05/30/2022   Annual exam 06/26/2021  Calcium 9.9            Date 02/18/2022  Upcoming dental procedures  NO

## 2022-05-30 NOTE — Telephone Encounter (Signed)
Yes, please schedule patient for DXA. She is due for imaging middle of this month. Thank you.

## 2022-05-30 NOTE — Telephone Encounter (Signed)
Patient will be due to have Prolia soon. Patients husband is inquiring about a dexa test before receiving another injection. I will route to Provider for recommendations

## 2022-05-31 ENCOUNTER — Other Ambulatory Visit: Payer: Self-pay | Admitting: Nurse Practitioner

## 2022-05-31 DIAGNOSIS — M810 Age-related osteoporosis without current pathological fracture: Secondary | ICD-10-CM

## 2022-06-03 NOTE — Telephone Encounter (Signed)
PA for Ubrelvy 100 MG has been APPROVED Coverage: 05/30/22-08/30/22. Pharmacy notified.

## 2022-06-06 ENCOUNTER — Ambulatory Visit (INDEPENDENT_AMBULATORY_CARE_PROVIDER_SITE_OTHER): Payer: PPO | Admitting: Internal Medicine

## 2022-06-06 ENCOUNTER — Encounter: Payer: Self-pay | Admitting: Internal Medicine

## 2022-06-06 DIAGNOSIS — F411 Generalized anxiety disorder: Secondary | ICD-10-CM

## 2022-06-06 DIAGNOSIS — K5900 Constipation, unspecified: Secondary | ICD-10-CM | POA: Diagnosis not present

## 2022-06-06 DIAGNOSIS — F3341 Major depressive disorder, recurrent, in partial remission: Secondary | ICD-10-CM | POA: Diagnosis not present

## 2022-06-06 DIAGNOSIS — R110 Nausea: Secondary | ICD-10-CM

## 2022-06-06 DIAGNOSIS — E1142 Type 2 diabetes mellitus with diabetic polyneuropathy: Secondary | ICD-10-CM

## 2022-06-06 DIAGNOSIS — E118 Type 2 diabetes mellitus with unspecified complications: Secondary | ICD-10-CM | POA: Diagnosis not present

## 2022-06-06 MED ORDER — LINACLOTIDE 290 MCG PO CAPS: 290.0000 ug | ORAL_CAPSULE | Freq: Every day | ORAL | 5 refills | Status: DC

## 2022-06-06 NOTE — Assessment & Plan Note (Signed)
Stop Victoza due to nausea, constipation

## 2022-06-06 NOTE — Assessment & Plan Note (Signed)
On prn Xanax  Potential benefits of a long term benzodiazepines  use as well as potential risks  and complications were explained to the patient and were aknowledged.

## 2022-06-06 NOTE — Progress Notes (Signed)
Subjective:  Patient ID: [name], female    DOB: 1940-11-16  Age: 81 y.o. MRN: [id number]  CC: Follow-up (6 week f/u)   HPI EMMANUELLA MIRANTE presents for nausea, upset stomach x several days. Pepto-bismol helps Stool once a week F/u on DM  Outpatient Medications Prior to Visit  Medication Sig Dispense Refill   acetaminophen (TYLENOL) 325 MG tablet Take 325 mg by mouth 2 (two) times daily as needed. Takes with tramadol     aspirin EC 81 MG tablet Take 1 tablet (81 mg total) by mouth daily. 100 tablet 3   BD PEN NEEDLE NANO U/F 32G X 4 MM MISC USE AS DIRECTED TWICE DAILY. 200 each 3   Blood Glucose Monitoring Suppl ([place]) w/Device KIT Use as instructed to check blood sugar 5 times daily. 1 kit 0   busPIRone (BUSPAR) 15 MG tablet Take 1 tablet by mouth 2 (two) times daily.     cetirizine (ZYRTEC) 10 MG tablet Take 10 mg by mouth daily as needed for allergies.     denosumab (PROLIA) 60 MG/ML SOSY injection Inject 60 mg into the skin every 6 (six) months.     famotidine (PEPCID) 20 MG tablet TAKE 1 TABLET (20 MG TOTAL) BY MOUTH AT BEDTIME. **PLEASE CALL THE OFFICE TO SCHEDULE A FOLLOW UP 90 tablet 0   L-Methylfolate (DEPLIN) 15 MG TABS Take 15 mg by mouth daily. For treatment of depression     Lancet Devices (ONE TOUCH DELICA LANCING DEV) MISC Use daily as directed 1 each 0   Liniments (S[place]ON[place] PAIN RELIEF PATCH EX) Apply 1 patch topically at bedtime.     LORazepam (ATIVAN) 1 MG tablet Take 1 mg by mouth 4 (four) times daily as needed for anxiety. Reports to taking 1/2 tablet at breakfast and lunch and 1 tablet at bedtime  2   ONETOUCH DELICA LANCETS 92K MISC USE UP TO 4 TIMES A DAY 200 each 3   ONETOUCH VERIO test strip Use as instructed to check blood sugar once a day 100 each 3   pantoprazole (PROTONIX) 40 MG tablet Take 1 tablet (40 mg total) by mouth 2 (two) times daily. **PLEASE CALL OFFICE TO SCHEDULE FOLLOW UP 180 tablet 0   pioglitazone (ACTOS) 30 MG  tablet TAKE 1 TABLET BY MOUTH EVERY DAY 90 tablet 1   repaglinide (PRANDIN) 1 MG tablet TAKE 2 TABLETS BY MOUTH BEFORE BREAKFAST, 1 TABLET AT LUNCH AND 1 TABLET AT DINNER 360 tablet 1   senna-docusate (SENOKOT-S) 8.6-50 MG tablet Take 2 tablets by mouth 2 (two) times daily. 100 tablet 6   SUMAtriptan (IMITREX) 100 MG tablet Take 1 tablet (100 mg total) by mouth every 2 (two) hours as needed. Max twice a day. 9 tablet 11   traMADol (ULTRAM) 50 MG tablet TAKE 1 TABLET (50 MG TOTAL) BY MOUTH EVERY 12 (TWELVE) HOURS AS NEEDED FOR SEVERE PAIN. 180 tablet 1   TRINTELLIX 20 MG TABS Take 10 mg by mouth daily. Take 10 mg daily  2   Ubrogepant (UBRELVY) 100 MG TABS Take 1 tablet at the onset of a migraine can repeat in 2 hours if needed 15 tablet 11   VOLTAREN 1 % GEL APPLY 2 GRAMS TO KNEE 2-3 TIMES DAILY PRN  1   liraglutide (VICTOZA) 18 MG/3ML SOPN Inject 0.6 mg into the skin daily. 3 mL 5   SHINGRIX injection  (Patient not taking: Reported on 06/06/2022)     No facility-administered medications  prior to visit.    ROS: Review of Systems  Constitutional:  Positive for unexpected weight change. Negative for activity change, appetite change, chills and fatigue.  HENT:  Negative for congestion, mouth sores and sinus pressure.   Eyes:  Negative for visual disturbance.  Respiratory:  Negative for cough and chest tightness.   Gastrointestinal:  Positive for nausea. Negative for abdominal pain and vomiting.  Genitourinary:  Negative for difficulty urinating, frequency and vaginal pain.  Musculoskeletal:  Negative for back pain and gait problem.  Skin:  Negative for pallor and rash.  Neurological:  Negative for dizziness, tremors, weakness, numbness and headaches.  Psychiatric/Behavioral:  Negative for confusion and sleep disturbance.     Objective:  BP 128/70 (BP Location: Left Arm)   Pulse 87   Temp 98.4 F (36.9 C) (Oral)   Ht 5' 2.25" (1.581 m)   Wt 119 lb 12.8 oz (54.3 kg)   SpO2 98%   BMI  21.74 kg/m   BP Readings from Last 3 Encounters:  06/06/22 128/70  04/24/22 120/68  04/24/22 120/68    Wt Readings from Last 3 Encounters:  06/06/22 119 lb 12.8 oz (54.3 kg)  04/24/22 123 lb 7.3 oz (56 kg)  04/24/22 123 lb 6.4 oz (56 kg)    Physical Exam Constitutional:      General: She is not in acute distress.    Appearance: Normal appearance. She is well-developed.  HENT:     Head: Normocephalic.     Right Ear: External ear normal.     Left Ear: External ear normal.     Nose: Nose normal.  Eyes:     General:        Right eye: No discharge.        Left eye: No discharge.     Conjunctiva/sclera: Conjunctivae normal.     Pupils: Pupils are equal, round, and reactive to light.  Neck:     Thyroid: No thyromegaly.     Vascular: No JVD.     Trachea: No tracheal deviation.  Cardiovascular:     Rate and Rhythm: Normal rate and regular rhythm.     Heart sounds: Normal heart sounds.  Pulmonary:     Effort: No respiratory distress.     Breath sounds: No stridor. No wheezing.  Abdominal:     General: Bowel sounds are normal. There is no distension.     Palpations: Abdomen is soft. There is no mass.     Tenderness: There is no abdominal tenderness. There is no guarding or rebound.  Musculoskeletal:        General: No tenderness.     Cervical back: Normal range of motion and neck supple. No rigidity.  Lymphadenopathy:     Cervical: No cervical adenopathy.  Skin:    Findings: No erythema or rash.  Neurological:     Cranial Nerves: No cranial nerve deficit.     Motor: No abnormal muscle tone.     Coordination: Coordination normal.     Deep Tendon Reflexes: Reflexes normal.  Psychiatric:        Behavior: Behavior normal.        Thought Content: Thought content normal.        Judgment: Judgment normal.     Lab Results  Component Value Date   WBC 6.9 06/21/2021   HGB 11.6 (L) 06/21/2021   HCT 35.6 (L) 06/21/2021   PLT 400.0 06/21/2021   GLUCOSE 89 02/18/2022    CHOL 116 05/10/2020   TRIG  53.0 05/10/2020   HDL 71.30 05/10/2020   LDLDIRECT 64.0 02/18/2022   LDLCALC 34 05/10/2020   ALT 13 02/18/2022   AST 21 02/18/2022   NA 133 (L) 02/18/2022   K 4.4 02/18/2022   CL 98 02/18/2022   CREATININE 0.98 02/18/2022   BUN 14 02/18/2022   CO2 28 02/18/2022   TSH 0.89 12/10/2021   INR 0.93 11/02/2013   HGBA1C 6.5 02/18/2022   MICROALBUR 1.4 09/18/2020    CT Abdomen Pelvis W Contrast  Result Date: 06/28/2021 CLINICAL DATA:  Epigastric pain.  Nausea and vomiting. EXAM: CT ABDOMEN AND PELVIS WITH CONTRAST TECHNIQUE: Multidetector CT imaging of the abdomen and pelvis was performed using the standard protocol following bolus administration of intravenous contrast. CONTRAST:  42m OMNIPAQUE IOHEXOL 350 MG/ML SOLN COMPARISON:  09/01/2018 FINDINGS: Lower chest: Few tiny nodular densities in the right lower lobe. 3 mm nodule in the right lower lobe on sequence 3 image 4 is stable since 2020. No pleural effusions. Hepatobiliary: Cholecystectomy. Normal appearance of the liver. No suspicious liver lesion. No significant biliary dilatation. Portal venous system is patent. Pancreas: Pancreas is poorly characterized and likely related to fatty infiltration. No evidence for duct dilatation or inflammation. 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 Adrenals/Urinary Tract: Normal appearance of the adrenal glands. Normal appearance of the urinary bladder. Fullness in left renal pelvis could represent an extrarenal pelvis. Negative for hydronephrosis. No suspicious renal lesion. Stomach/Bowel: Small hiatal hernia. Moderate to large amount of stool throughout the colon. Oral contrast throughout the colon. No evidence for an obstruction. Normal appendix. Vascular/Lymphatic: Aortic atherosclerosis. No enlarged abdominal or pelvic lymph nodes. Reproductive: Status post hysterectomy. No adnexal masses. Soft tissue fullness in the vaginal region. Other: Negative for free fluid. Negative for free air. Tiny  umbilical hernia containing fat. Musculoskeletal: Skin or superficial soft tissue thickening / edema in the left lower abdomen. Findings are nonspecific. Degenerative changes at the symphysis pubis. Surgical fixation of the right SI joint. Severe disc space narrowing with endplate changes at LW1-U9 Mild anterolisthesis at L5-S1. IMPRESSION: 1. No acute abnormality in the abdomen or pelvis. 2. Moderate to large amount of stool in the colon. No evidence for a bowel obstruction. 3. Small hiatal hernia. Electronically Signed   By: AMarkus DaftM.D.   On: 06/28/2021 16:59    Assessment & Plan:   Problem List Items Addressed This Visit     Anxiety state    On prn Xanax  Potential benefits of a long term benzodiazepines  use as well as potential risks  and complications were explained to the patient and were aknowledged.      Chronic nausea    Stop Victoza due to nausea, constipation      Constipation    Worse Will try Linzess again 290 mcg/d  PHardin Negus Laxative Caplets  Stop Victoza      Depression    Feeling better      Diabetic peripheral neuropathy associated with type 2 diabetes mellitus ([place]    Stop Victoza due to nausea, constipation      DM type 2, controlled, with complication [place]    Stop Victoza due to nausea, constipation         Meds ordered this encounter  Medications   DISCONTD: linaclotide (LINZESS) 290 MCG CAPS capsule    Sig: Take 1 capsule (290 mcg total) by mouth daily.    Dispense:  30 capsule    Refill:  5   linaclotide (LINZESS) 290 MCG CAPS capsule  Sig: Take 1 capsule (290 mcg total) by mouth daily.    Dispense:  30 capsule    Refill:  5      Follow-up: No follow-ups on file.  Walker Kehr, MD

## 2022-06-06 NOTE — Assessment & Plan Note (Signed)
Feeling better.

## 2022-06-06 NOTE — Assessment & Plan Note (Addendum)
Worse Will try Linzess again 290 mcg/d  Hardin Negus' Laxative Caplets  Stop Victoza

## 2022-06-06 NOTE — Patient Instructions (Signed)
[name]' Laxative Caplets

## 2022-06-18 ENCOUNTER — Other Ambulatory Visit (INDEPENDENT_AMBULATORY_CARE_PROVIDER_SITE_OTHER): Payer: PPO

## 2022-06-18 ENCOUNTER — Other Ambulatory Visit: Payer: Self-pay | Admitting: Nurse Practitioner

## 2022-06-18 ENCOUNTER — Ambulatory Visit (INDEPENDENT_AMBULATORY_CARE_PROVIDER_SITE_OTHER): Payer: PPO

## 2022-06-18 DIAGNOSIS — E1165 Type 2 diabetes mellitus with hyperglycemia: Secondary | ICD-10-CM

## 2022-06-18 DIAGNOSIS — Z780 Asymptomatic menopausal state: Secondary | ICD-10-CM

## 2022-06-18 DIAGNOSIS — M810 Age-related osteoporosis without current pathological fracture: Secondary | ICD-10-CM

## 2022-06-18 DIAGNOSIS — Z13820 Encounter for screening for osteoporosis: Secondary | ICD-10-CM | POA: Diagnosis not present

## 2022-06-18 DIAGNOSIS — E7800 Pure hypercholesterolemia, unspecified: Secondary | ICD-10-CM | POA: Diagnosis not present

## 2022-06-18 DIAGNOSIS — M8589 Other specified disorders of bone density and structure, multiple sites: Secondary | ICD-10-CM

## 2022-06-18 LAB — COMPREHENSIVE METABOLIC PANEL
ALT: 24 U/L (ref 0–35)
AST: 31 U/L (ref 0–37)
Albumin: 3.8 g/dL (ref 3.5–5.2)
Alkaline Phosphatase: 63 U/L (ref 39–117)
BUN: 14 mg/dL (ref 6–23)
CO2: 30 mEq/L (ref 19–32)
Calcium: 9.8 mg/dL (ref 8.4–10.5)
Chloride: 101 mEq/L (ref 96–112)
Creatinine, Ser: 1.08 mg/dL (ref 0.40–1.20)
GFR: 48.4 mL/min — ABNORMAL LOW (ref >60.00)
Glucose, Bld: 120 mg/dL — ABNORMAL HIGH (ref 70–99)
Potassium: 4.1 mEq/L (ref 3.5–5.1)
Sodium: 136 mEq/L (ref 135–145)
Total Bilirubin: 0.4 mg/dL (ref 0.2–1.2)
Total Protein: 7 g/dL (ref 6.0–8.3)

## 2022-06-18 LAB — LDL CHOLESTEROL, DIRECT: Direct LDL: 48 mg/dL

## 2022-06-18 LAB — MICROALBUMIN / CREATININE URINE RATIO
Creatinine,U: 59.3 mg/dL
Microalb Creat Ratio: 1.7 mg/g (ref 0.0–30.0)
Microalb, Ur: 1 mg/dL (ref 0.0–1.9)

## 2022-06-18 LAB — HEMOGLOBIN A1C: Hgb A1c MFr Bld: 7.3 % — ABNORMAL HIGH (ref 4.6–6.5)

## 2022-06-19 ENCOUNTER — Encounter: Payer: Self-pay | Admitting: Nurse Practitioner

## 2022-06-20 ENCOUNTER — Ambulatory Visit: Payer: PPO | Admitting: Endocrinology

## 2022-06-20 ENCOUNTER — Encounter: Payer: Self-pay | Admitting: Endocrinology

## 2022-06-20 VITALS — BP 138/70 | HR 91 | Ht 62.25 in | Wt 123.2 lb

## 2022-06-20 DIAGNOSIS — E1165 Type 2 diabetes mellitus with hyperglycemia: Secondary | ICD-10-CM | POA: Diagnosis not present

## 2022-06-20 DIAGNOSIS — R110 Nausea: Secondary | ICD-10-CM

## 2022-06-20 MED ORDER — EMPAGLIFLOZIN 10 MG PO TABS: 10.0000 mg | ORAL_TABLET | Freq: Every day | ORAL | 3 refills | Status: DC

## 2022-06-20 NOTE — Patient Instructions (Signed)
Check blood sugars on waking up 2 days a week  Also check blood sugars about 2 hours after meals and do this after different meals by rotation  Recommended blood sugar levels on waking up are 90-130 and about 2 hours after meal is 130-160  Please bring your blood sugar monitor to each visit, thank you   
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
## 2022-06-22 DIAGNOSIS — G4733 Obstructive sleep apnea (adult) (pediatric): Secondary | ICD-10-CM | POA: Diagnosis not present

## 2022-06-27 ENCOUNTER — Encounter: Payer: Self-pay | Admitting: Nurse Practitioner

## 2022-06-27 ENCOUNTER — Ambulatory Visit (INDEPENDENT_AMBULATORY_CARE_PROVIDER_SITE_OTHER): Payer: PPO | Admitting: Nurse Practitioner

## 2022-06-27 VITALS — BP 128/82 | HR 95 | Ht 61.5 in | Wt 124.0 lb

## 2022-06-27 DIAGNOSIS — Z780 Asymptomatic menopausal state: Secondary | ICD-10-CM

## 2022-06-27 DIAGNOSIS — Z01419 Encounter for gynecological examination (general) (routine) without abnormal findings: Secondary | ICD-10-CM

## 2022-06-27 DIAGNOSIS — M810 Age-related osteoporosis without current pathological fracture: Secondary | ICD-10-CM

## 2022-06-27 NOTE — Progress Notes (Signed)
   [name] 11/06/1940 [id number]   History:  81 y.o. G3P3003 for breast and pelvic exam. S/P 1970s TAH BSO. Weaned off ERT last year and doing fine. History of Osteoporosis, currently on Prolia. T2DM managed by endocrinology.   Gynecologic History No LMP recorded. Patient has had a hysterectomy.   Contraception/Family planning: status post hysterectomy Sexually active: Yes  Health maintenance Last Pap: 2012. Results were: Normal Last mammogram: 06/12/2021. Results were: Normal Last colonoscopy: 12/05/2015. Results were: Normal Last Dexa: 06/18/2022. Results were: T-score -2.2  Past medical history, past surgical history, family history and social history were all reviewed and documented in the EPIC chart. Married. Retired. 3 children - one local, one in [place], one in [place]. 8 granddaughters ages 77-25. Mother deceased from colon cancer in her 56s, son with history of colon cancer in his 14s, sister and niece with history of breast cancer.   ROS:  A ROS was performed and pertinent positives and negatives are included.  Exam:  Vitals:   06/27/22 1346  BP: 128/82  Pulse: 95  SpO2: 98%  Weight: 124 lb (56.2 kg)  Height: 5' 1.5" (1.562 m)     Body mass index is 23.05 kg/m.  General appearance:  Normal Thyroid:  Symmetrical, normal in size, without palpable masses or nodularity. Respiratory  Auscultation:  Clear without wheezing or rhonchi Cardiovascular  Auscultation:  Regular rate, without rubs, murmurs or gallops  Edema/varicosities:  Not grossly evident Abdominal  Soft,nontender, without masses, guarding or rebound.  Liver/spleen:  No organomegaly noted  Hernia:  None appreciated  Skin  Inspection:  Grossly normal Breasts: Examined lying and sitting.   Right: Without masses, retractions, nipple discharge or axillary adenopathy.   Left: Without masses, retractions, nipple discharge or axillary adenopathy. Genitourinary   Inguinal/mons:  Normal without  inguinal adenopathy  External genitalia:  Normal appearing vulva with no masses, tenderness, or lesions  BUS/Urethra/Skene's glands:  Normal  Vagina:  Normal appearing with normal color and discharge, no lesions  Cervix:  Normal appearing without discharge or lesions  Uterus:  and uterus absent  Adnexa/parametria:     Rt: Normal in size, without masses or tenderness.   Lt: Normal in size, without masses or tenderness.  Anus and perineum: Normal  Digital rectal exam: Normal sphincter tone without palpated masses or tenderness  Patient informed chaperone available to be present for breast and pelvic exam. Patient has requested no chaperone to be present. Patient has been advised what will be completed during breast and pelvic exam.   Assessment/Plan:  81 y.o. [id number] for breast and pelvic ex  Encounter for breast and pelvic examination - Education provided on SBEs, importance of preventative screenings, current guidelines, high calcium diet, regular exercise, and multivitamin daily. Labs with PCP and endocrinology.   Age-related osteoporosis without current pathological fracture - Most recent T-score -2.2 (06/18/2022). Currently on Prolia. Significant decease in left hip. Recommend switching therapies. Discussed Reclast and she is agreeable. Normal creatinine and calcium 06/18/22.   Postmenopausal - stopped ERT last year. Denies menopausal symptoms  Screening for cervical cancer - Normal Pap history. No longer screening per guidelines.   Screening for breast cancer - Normal mammogram history.  Continue annual screenings.  Normal breast exam today.  Screening for colon cancer - 2017 colonoscopy. Screenings no longer recommended per GI.   Follow up in 2 year for breast and pelvic exam.     Tamela Gammon [facility], 2:14 PM 06/27/2022

## 2022-06-28 ENCOUNTER — Ambulatory Visit: Payer: PPO | Admitting: Physician Assistant

## 2022-06-28 ENCOUNTER — Telehealth: Payer: Self-pay | Admitting: *Deleted

## 2022-06-28 NOTE — Telephone Encounter (Signed)
-----   Message from Tamela Gammon, NP sent at 06/27/2022  2:17 PM EDT ----- Regarding: Reclast Please start reclast process for her. Currently on Prolia but had significant decrease in left hip. Normal creatinine and calcium 06/18/2022.

## 2022-06-28 NOTE — Telephone Encounter (Addendum)
Reclast instructions   Annual Exam (1 year) 06/27/2022  Labs must be in 30 days window Serum Creatinine DATE? Serum Calcium DATE?  Deductible this plan does not have ded OOP 3200/1366.78  Reference # [id number]  PA needed? Not required   Cost for Pt? 80/20=$23.19  Order form filled out and faxed  w/MD sig?  Infusion will be done at ? [facility]  Pt aware?  Instructions mailed to pt?   Left message for pt to return my call 07/12/2022

## 2022-07-05 ENCOUNTER — Telehealth: Payer: PPO

## 2022-07-11 ENCOUNTER — Other Ambulatory Visit: Payer: Self-pay | Admitting: Physician Assistant

## 2022-07-11 NOTE — Telephone Encounter (Signed)
Please advise 

## 2022-07-15 ENCOUNTER — Other Ambulatory Visit: Payer: Self-pay | Admitting: Nurse Practitioner

## 2022-07-15 DIAGNOSIS — Z1231 Encounter for screening mammogram for malignant neoplasm of breast: Secondary | ICD-10-CM

## 2022-07-17 ENCOUNTER — Other Ambulatory Visit: Payer: PPO

## 2022-07-17 DIAGNOSIS — M810 Age-related osteoporosis without current pathological fracture: Secondary | ICD-10-CM | POA: Diagnosis not present

## 2022-07-18 LAB — CREATININE, SERUM: Creat: 1.2 mg/dL — ABNORMAL HIGH (ref 0.60–0.95)

## 2022-07-18 LAB — CALCIUM: Calcium: 9.4 mg/dL (ref 8.6–10.4)

## 2022-07-23 ENCOUNTER — Telehealth: Payer: Self-pay

## 2022-07-23 DIAGNOSIS — G4733 Obstructive sleep apnea (adult) (pediatric): Secondary | ICD-10-CM | POA: Diagnosis not present

## 2022-07-23 NOTE — Telephone Encounter (Signed)
Creatine labs elevated at 1.20  Routing to Provider for recommendations of Reclast

## 2022-07-23 NOTE — Telephone Encounter (Signed)
Patient left voicemail called back and patient had a billing issue. States wrong code was used. Gave billing number to patient.

## 2022-07-23 NOTE — Telephone Encounter (Signed)
OK to proceed with Reclast. Thanks.

## 2022-07-26 NOTE — Telephone Encounter (Signed)
Patient would like a to set her Reclast set up for 08/07/19 or 08/07/2022

## 2022-07-29 ENCOUNTER — Encounter: Payer: Self-pay | Admitting: Endocrinology

## 2022-07-29 ENCOUNTER — Ambulatory Visit (INDEPENDENT_AMBULATORY_CARE_PROVIDER_SITE_OTHER): Payer: PPO | Admitting: Gastroenterology

## 2022-07-29 ENCOUNTER — Telehealth: Payer: Self-pay | Admitting: *Deleted

## 2022-07-29 ENCOUNTER — Encounter: Payer: Self-pay | Admitting: Gastroenterology

## 2022-07-29 VITALS — BP 122/76 | HR 79 | Ht 61.5 in | Wt 121.0 lb

## 2022-07-29 DIAGNOSIS — R634 Abnormal weight loss: Secondary | ICD-10-CM | POA: Diagnosis not present

## 2022-07-29 DIAGNOSIS — K589 Irritable bowel syndrome without diarrhea: Secondary | ICD-10-CM | POA: Diagnosis not present

## 2022-07-29 DIAGNOSIS — K219 Gastro-esophageal reflux disease without esophagitis: Secondary | ICD-10-CM

## 2022-07-29 MED ORDER — FAMOTIDINE 40 MG PO TABS: 40.0000 mg | ORAL_TABLET | Freq: Every day | ORAL | 3 refills | Status: DC

## 2022-07-29 MED ORDER — DICYCLOMINE HCL 10 MG PO CAPS: 10.0000 mg | ORAL_CAPSULE | Freq: Three times a day (TID) | ORAL | 3 refills | Status: DC

## 2022-07-29 MED ORDER — PANTOPRAZOLE SODIUM 40 MG PO TBEC: 40.0000 mg | DELAYED_RELEASE_TABLET | Freq: Two times a day (BID) | ORAL | 3 refills | Status: DC

## 2022-07-29 NOTE — Telephone Encounter (Signed)
08/06/2022 @ 10:00 

## 2022-07-29 NOTE — Telephone Encounter (Signed)
Is burning at start or end of stream? Any vaginal discharge or odor? Urinary frequency, urgency, flank pain, hematuria?

## 2022-07-29 NOTE — Telephone Encounter (Signed)
Patient complains of dysuria and vaginal itching. Patient would like treatment over phone. I will route to Provider for recommendations.

## 2022-07-29 NOTE — Progress Notes (Signed)
Assessment     GERD IBS-C Weight loss, no clear etiology Family history of colon cancer, personal history of adenomatous colon polyps - no longer in surveillance   Recommendations    Increase famotidine to 40 mg hs, continue pantoprazole 40 mg bid, TUMS prn and follow antireflux measures DC Linzess. Resume MOM 4 tablets qd Resume dicyclomine 10 mg po tid ac REV in 6 weeks   HPI    This is an 81 year old female with GERD, IBS, constipation, abdominal pain.  She is accompanied by her husband.  She relates postprandial generalized abdominal pain and bloating happening several days of the week on a regular basis.  She has had a gradual weight loss with a decreased appetite for a couple years.  She was placed on Linzess by Dr. Alain Marion which is not helping her constipation or abdominal pain.  She relates that for milk of magnesia tablets daily was effective for her constipation.  She relates that dicyclomine was helpful in the past for abdominal pain although she is no longer taking it.  Her reflux symptoms have been under fair control with breakthrough occurring 2-3 times per week.  Most recent endoscopic studies, blood work and CTAP below.  June 2019 - Normal esophagus. - Medium-sized hiatal hernia. - Normal duodenal bulb and second portion of the duodenum. - No specimens collected.  Colonoscopy April 207 - The digital rectal exam was normal. - The colon (entire examined portion) appeared normal. - The retroflexed view of the distal rectum and anal verge was normal and showed no anal or rectal abnormalities.   Labs / Imaging       Latest Ref Rng & Units 06/18/2022   10:39 AM 02/18/2022   10:35 AM 12/10/2021    4:10 PM  Hepatic Function  Total Protein 6.0 - 8.3 g/dL 7.0  6.9  7.1   Albumin 3.5 - 5.2 g/dL 3.8  4.1  4.2   AST 0 - 37 U/L _0 ALT 0 - 35 U/L _1 Alk Phosphatase 39 - 117 U/L 63  44  42   Total Bilirubin 0.2 - 1.2 mg/dL 0.4  0.4  0.3         Latest Ref Rng & Units 06/21/2021    2:05 PM 03/13/2018   12:05 PM 12/02/2017   11:57 AM  CBC  WBC 4.0 - 10.5 K/uL 6.9  7.1  9.1   Hemoglobin 12.0 - 15.0 g/dL 11.6  10.8  11.4   Hematocrit 36.0 - 46.0 % 35.6  33.2  34.1   Platelets 150.0 - 400.0 K/uL 400.0  412  405.0     CT AP 06/28/2022 1. No acute abnormality in the abdomen or pelvis. 2. Moderate to large amount of stool in the colon. No evidence for a bowel obstruction. 3. Small hiatal hernia.   Current Medications, Allergies, Past Medical History, Past Surgical History, Family History and Social History were reviewed in [employer] record.   Physical Exam: General: Well developed, well nourished, no acute distress Head: Normocephalic and atraumatic Eyes: Sclerae anicteric, EOMI Ears: Normal auditory acuity Mouth: No deformities or lesions noted Lungs: Clear throughout to auscultation Heart: Regular rate and rhythm; No murmurs, rubs or bruits Abdomen: Soft, non tender and non distended. No masses, hepatosplenomegaly or hernias noted. Normal Bowel sounds Rectal: Not done  Musculoskeletal: Symmetrical with no gross deformities  Pulses:  Normal pulses noted Extremities: No  edema or deformities noted Neurological: Alert oriented x 4, grossly nonfocal Psychological:  Alert and cooperative. Normal mood and affect   Windel Keziah T. Fuller Plan, MD 07/29/2022, 3:38 PM

## 2022-07-29 NOTE — Patient Instructions (Signed)
We have sent the following medications to your pharmacy for you to pick up at your convenience: pantoprazole, famotidine and dicyclomine.   Stop Linzess.   Take over the counter milk of magnesia 4 tablets daily.   Patient advised to avoid spicy, acidic, citrus, chocolate, mints, fruit and fruit juices.  Limit the intake of caffeine, alcohol and Soda.  Don't exercise too soon after eating.  Don't lie down within 3-4 hours of eating.  Elevate the head of your bed.  The [facility] providers would like to encourage you to use [facility] to communicate with providers for non-urgent requests or questions.  Due to long hold times on the telephone, sending your provider a message by [facility] may be a faster and more efficient way to get a response.  Please allow 48 business hours for a response.  Please remember that this is for non-urgent requests.   Thank you for choosing me and [facility].  Pricilla Riffle. Dagoberto Ligas., MD., Marval Regal

## 2022-07-30 ENCOUNTER — Other Ambulatory Visit: Payer: Self-pay | Admitting: Internal Medicine

## 2022-07-30 ENCOUNTER — Telehealth: Payer: Self-pay | Admitting: *Deleted

## 2022-07-30 NOTE — Telephone Encounter (Signed)
Is burning at start or end of stream? End of stream Any vaginal discharge? Yes color yellow Vaginal odor? No Urinary frequency? No Urgency? No flank pain ?Yes stomach   hematuria? Yes   Patient understands we may need to treat her in office. Patient still request over phone treatment if possible.

## 2022-07-30 NOTE — Telephone Encounter (Signed)
She needs to make OV. She needs urine cultured and possible testing for vaginal infection. Thanks.

## 2022-07-30 NOTE — Telephone Encounter (Signed)
Followed up with patient: patient wakes up at 10 am and will call us back

## 2022-07-30 NOTE — Telephone Encounter (Signed)
Pt has appt 08-01-2022 for botox.  Is a PA been done or required.

## 2022-07-30 NOTE — Telephone Encounter (Signed)
Spoke with patient . Patient will make an appointment  to be seen

## 2022-07-31 ENCOUNTER — Other Ambulatory Visit (HOSPITAL_COMMUNITY): Payer: Self-pay

## 2022-07-31 ENCOUNTER — Telehealth: Payer: Self-pay | Admitting: Adult Health

## 2022-07-31 ENCOUNTER — Ambulatory Visit: Payer: PPO | Admitting: Obstetrics & Gynecology

## 2022-07-31 ENCOUNTER — Encounter: Payer: Self-pay | Admitting: Family

## 2022-07-31 ENCOUNTER — Encounter: Payer: Self-pay | Admitting: Obstetrics & Gynecology

## 2022-07-31 VITALS — BP 106/64 | HR 84 | Temp 97.7°F | Resp 16

## 2022-07-31 DIAGNOSIS — R300 Dysuria: Secondary | ICD-10-CM | POA: Diagnosis not present

## 2022-07-31 DIAGNOSIS — N898 Other specified noninflammatory disorders of vagina: Secondary | ICD-10-CM

## 2022-07-31 MED ORDER — FLUCONAZOLE 150 MG PO TABS: 150.0000 mg | ORAL_TABLET | ORAL | 0 refills | Status: AC

## 2022-07-31 MED ORDER — SULFAMETHOXAZOLE-TRIMETHOPRIM 800-160 MG PO TABS: 1.0000 | ORAL_TABLET | Freq: Two times a day (BID) | ORAL | 0 refills | Status: AC

## 2022-07-31 NOTE — Progress Notes (Signed)
    [name] 1941/01/09 [id number]        80 y.o.  G3P3003   RP: Urinary Burning and Frequency  HPI: Urinary Burning and Frequency.  No pelvic pain.  No fever.  Vaginal itching as well.  No PMB.   OB History  Gravida Para Term Preterm AB Living  '3 3 3     3  '$ SAB IAB Ectopic Multiple Live Births               # Outcome Date GA Lbr Len/2nd Weight Sex Delivery Anes PTL Lv  3 Term           2 Term           1 Term             Past medical history,surgical history, problem list, medications, allergies, family history and social history were all reviewed and documented in the EPIC chart.   Directed ROS with pertinent positives and negatives documented in the history of present illness/assessment and plan.  Exam:  Vitals:   07/31/22 1519  BP: 106/64  Pulse: 84  Resp: 16  Temp: 97.7 F (36.5 C)  TempSrc: Oral   General appearance:  Normal  CVAT Neg bilaterally  Abdomen: Normal  Gynecologic exam: Vulva normal except for thick discharge compatible with yeast vaginitis.  U/A: Yellow clear, Nit Pos, Pro Neg, WBC 0-5, RBC 0-2, Bacteria Many, Yeast Neg.  U. Culture pending.   Assessment/Plan:  81 y.o. G3P3003   1. Burning with urination Urinary Burning and Frequency.  No pelvic pain.  No fever.  Vaginal itching as well.  No PMB.  U/A Nit Pos and many Bacteria, probable acute Cystitis.  No allergy to Sulfa.  Treat with Bactrim DS x 3 days.  Prescription sent to pharmacy.  U. Culture pending. - Urinalysis,Complete w/RFL Culture  2. Vagina itching Clinical yeast vaginitis.  Will treat with Fluconazole.  Usage reviewed, prescription sent to pharmacy.  Other orders - sulfamethoxazole-trimethoprim (BACTRIM DS) 800-160 MG tablet; Take 1 tablet by mouth 2 (two) times daily for 3 days. - fluconazole (DIFLUCAN) 150 MG tablet; Take 1 tablet (150 mg total) by mouth every other day for 3 doses.   Princess Bruins MD, 3:25 PM 07/31/2022

## 2022-07-31 NOTE — Telephone Encounter (Signed)
Pt is scheduled to see [name] tomorrow for botox and [name] is going to be out tomorrow afternoon and pt said they need an afternoon. Is it possible for pt to be worked in somewhere?

## 2022-07-31 NOTE — Telephone Encounter (Signed)
Thanks

## 2022-07-31 NOTE — Telephone Encounter (Signed)
Per Jinny Blossom, ok to offer patient tomorrow 12/7 at 1 pm OR next Monday 12/11 at 3:30 PM.

## 2022-07-31 NOTE — Telephone Encounter (Signed)
Pt rescheduled for 12/7 at 1:00pm

## 2022-07-31 NOTE — Telephone Encounter (Signed)
Noted  

## 2022-08-01 ENCOUNTER — Encounter: Payer: Self-pay | Admitting: Endocrinology

## 2022-08-01 ENCOUNTER — Ambulatory Visit: Payer: PPO | Admitting: Adult Health

## 2022-08-01 DIAGNOSIS — E1165 Type 2 diabetes mellitus with hyperglycemia: Secondary | ICD-10-CM

## 2022-08-02 ENCOUNTER — Telehealth: Payer: Self-pay | Admitting: *Deleted

## 2022-08-02 MED ORDER — VICTOZA 18 MG/3ML ~~LOC~~ SOPN: 0.6000 mg | PEN_INJECTOR | Freq: Every day | SUBCUTANEOUS | 1 refills | Status: DC

## 2022-08-02 NOTE — Addendum Note (Signed)
Addended by: Lauralyn Primes on: 08/02/2022 04:55 PM   Modules accepted: Orders

## 2022-08-02 NOTE — Telephone Encounter (Signed)
Patient informed. 

## 2022-08-02 NOTE — Telephone Encounter (Addendum)
Patient scheduled for Relcast on 08/06/22, she is was seen on 07/31/22 for UTI bactrim bid x 3 days and diflucan 150 mg 1 po daily x 3 days yeast. She wants to confirm okay to still have relcast next week?  I did tell patient I thought this would be fine. But I would double check.

## 2022-08-02 NOTE — Telephone Encounter (Signed)
I do not see victoza 0.'6mg'$  to order. Only 1.'8mg'$ 

## 2022-08-04 LAB — URINALYSIS, COMPLETE W/RFL CULTURE
Bilirubin Urine: NEGATIVE
Casts: NONE SEEN /LPF
Crystals: NONE SEEN /HPF
Hyaline Cast: NONE SEEN /LPF
Ketones, ur: NEGATIVE
Leukocyte Esterase: NEGATIVE
Nitrites, Initial: POSITIVE — AB
Protein, ur: NEGATIVE
Specific Gravity, Urine: 1.01 (ref 1.001–1.035)
Yeast: NONE SEEN /HPF
pH: 5.5 (ref 5.0–8.0)

## 2022-08-04 LAB — URINE CULTURE
MICRO NUMBER:: 14278476
SPECIMEN QUALITY:: ADEQUATE

## 2022-08-04 LAB — EXTRA SPECIMEN

## 2022-08-04 LAB — CULTURE INDICATED

## 2022-08-05 ENCOUNTER — Other Ambulatory Visit (HOSPITAL_COMMUNITY): Payer: Self-pay

## 2022-08-06 ENCOUNTER — Ambulatory Visit: Payer: PPO | Admitting: Internal Medicine

## 2022-08-06 ENCOUNTER — Ambulatory Visit (HOSPITAL_COMMUNITY)
Admission: RE | Admit: 2022-08-06 | Discharge: 2022-08-06 | Disposition: A | Discharge status: Home / Self Care | Payer: PPO | Source: Ambulatory Visit | Attending: Nurse Practitioner | Admitting: Nurse Practitioner

## 2022-08-06 DIAGNOSIS — M810 Age-related osteoporosis without current pathological fracture: Secondary | ICD-10-CM | POA: Insufficient documentation

## 2022-08-06 MED ORDER — ZOLEDRONIC ACID 5 MG/100ML IV SOLN
INTRAVENOUS | Status: AC
  Administered 2022-08-06: 5 mg
  Filled 2022-08-06: qty 100

## 2022-08-06 MED ORDER — ZOLEDRONIC ACID 5 MG/100ML IV SOLN: 5.0000 mg | Freq: Once | INTRAVENOUS | Status: DC

## 2022-08-08 ENCOUNTER — Encounter: Payer: Self-pay | Admitting: Internal Medicine

## 2022-08-08 ENCOUNTER — Ambulatory Visit (INDEPENDENT_AMBULATORY_CARE_PROVIDER_SITE_OTHER): Payer: PPO | Admitting: Internal Medicine

## 2022-08-08 VITALS — BP 120/72 | HR 88 | Temp 98.6°F | Ht 61.5 in | Wt 115.8 lb

## 2022-08-08 DIAGNOSIS — R634 Abnormal weight loss: Secondary | ICD-10-CM

## 2022-08-08 DIAGNOSIS — N183 Chronic kidney disease, stage 3 unspecified: Secondary | ICD-10-CM | POA: Diagnosis not present

## 2022-08-08 DIAGNOSIS — E118 Type 2 diabetes mellitus with unspecified complications: Secondary | ICD-10-CM

## 2022-08-08 DIAGNOSIS — M810 Age-related osteoporosis without current pathological fracture: Secondary | ICD-10-CM | POA: Diagnosis not present

## 2022-08-08 NOTE — Assessment & Plan Note (Addendum)
Pt is on Victoza 0.6 mg/d per Dr Dwyane Dee (tolerating OK: less "full" and still loosing wt) Pt lost 75 lbs total.  BMI 21 now

## 2022-08-08 NOTE — Assessment & Plan Note (Signed)
Hydrate well ?Monitor GFR ?

## 2022-08-08 NOTE — Assessment & Plan Note (Signed)
Pt is on Victoza 0.6 mg/d per Dr Dwyane Dee (tolerating OK: less "full" and still loosing wt)

## 2022-08-08 NOTE — Assessment & Plan Note (Addendum)
Pt is on Victoza 0.6 mg/d per Dr Dwyane Dee (tolerating OK: less "full" and still loosing wt) Pt lost 75 lbs total

## 2022-08-08 NOTE — Progress Notes (Signed)
Subjective:  Patient ID: [name], female    DOB: 01/04/1941  Age: 81 y.o. MRN: [id number]  CC: Follow-up (2 month f/u)   HPI MONSERRATT KNEZEVIC presents for abd pain, nausea, GERD, DM, bone loss  Outpatient Medications Prior to Visit  Medication Sig Dispense Refill   acetaminophen (TYLENOL) 325 MG tablet Take 325 mg by mouth 2 (two) times daily as needed. Takes with tramadol     aspirin EC 81 MG tablet Take 1 tablet (81 mg total) by mouth daily. 100 tablet 3   BD PEN NEEDLE NANO U/F 32G X 4 MM MISC USE AS DIRECTED TWICE DAILY. 200 each 3   Blood Glucose Monitoring Suppl (Green) w/Device KIT Use as instructed to check blood sugar 5 times daily. 1 kit 0   cetirizine (ZYRTEC) 10 MG tablet Take 10 mg by mouth daily as needed for allergies.     dicyclomine (BENTYL) 10 MG capsule Take 1 capsule (10 mg total) by mouth 3 (three) times daily before meals. 270 capsule 3   famotidine (PEPCID) 40 MG tablet Take 1 tablet (40 mg total) by mouth at bedtime. 90 tablet 3   L-Methylfolate (DEPLIN) 15 MG TABS Take 15 mg by mouth daily. For treatment of depression     Lancet Devices (ONE TOUCH DELICA LANCING DEV) MISC Use daily as directed 1 each 0   Liniments (SALONPAS PAIN RELIEF PATCH EX) Apply 1 patch topically at bedtime.     liraglutide (VICTOZA) 18 MG/3ML SOPN Inject 0.6 mg into the skin daily. 9 mL 1   LORazepam (ATIVAN) 1 MG tablet Take 1 mg by mouth 4 (four) times daily as needed for anxiety. Reports to taking 1/2 tablet at breakfast and lunch and 1 tablet at bedtime  2   Magnesium Hydroxide (MILK OF MAGNESIA PO) Take 1-2 capsules by mouth daily as needed.     ONETOUCH DELICA LANCETS 68L MISC USE UP TO 4 TIMES A DAY 200 each 3   ONETOUCH VERIO test strip Use as instructed to check blood sugar once a day 100 each 3   pantoprazole (PROTONIX) 40 MG tablet Take 1 tablet (40 mg total) by mouth 2 (two) times daily. 180 tablet 3   pioglitazone (ACTOS) 30 MG tablet TAKE 1 TABLET  BY MOUTH EVERY DAY 90 tablet 1   repaglinide (PRANDIN) 1 MG tablet TAKE 2 TABLETS BY MOUTH BEFORE BREAKFAST, 1 TABLET AT LUNCH AND 1 TABLET AT DINNER 360 tablet 1   SUMAtriptan (IMITREX) 100 MG tablet Take 1 tablet (100 mg total) by mouth every 2 (two) hours as needed. Max twice a day. 9 tablet 11   traMADol (ULTRAM) 50 MG tablet TAKE 1 TABLET (50 MG TOTAL) BY MOUTH EVERY 12 (TWELVE) HOURS AS NEEDED FOR SEVERE PAIN. 180 tablet 1   TRINTELLIX 20 MG TABS Take 10 mg by mouth daily. Take 10 mg daily  2   Ubrogepant (UBRELVY) 100 MG TABS Take 1 tablet at the onset of a migraine can repeat in 2 hours if needed 15 tablet 11   VOLTAREN 1 % GEL APPLY 2 GRAMS TO KNEE 2-3 TIMES DAILY PRN  1   busPIRone (BUSPAR) 15 MG tablet Take 1 tablet by mouth 2 (two) times daily. (Patient not taking: Reported on 08/08/2022)     empagliflozin (JARDIANCE) 10 MG TABS tablet Take 1 tablet (10 mg total) by mouth daily with breakfast. (Patient not taking: Reported on 07/31/2022) 30 tablet 3   senna-docusate (SENOKOT-S) 8.6-50  MG tablet Take 2 tablets by mouth 2 (two) times daily. (Patient not taking: Reported on 08/08/2022) 100 tablet 6   No facility-administered medications prior to visit.    ROS: Review of Systems  Constitutional:  Positive for unexpected weight change. Negative for activity change, appetite change, chills and fatigue.  HENT:  Negative for congestion, mouth sores and sinus pressure.   Eyes:  Negative for visual disturbance.  Respiratory:  Negative for cough and chest tightness.   Gastrointestinal:  Positive for nausea. Negative for abdominal pain and vomiting.  Genitourinary:  Negative for difficulty urinating, frequency and vaginal pain.  Musculoskeletal:  Negative for back pain and gait problem.  Skin:  Negative for pallor and rash.  Neurological:  Negative for dizziness, tremors, weakness, numbness and headaches.  Psychiatric/Behavioral:  Negative for confusion, sleep disturbance and suicidal ideas.  The patient is not nervous/anxious.     Objective:  BP 120/72 (BP Location: Left Arm)   Pulse 88   Temp 98.6 F (37 C) (Oral)   Ht 5' 1.5" (1.562 m)   Wt 115 lb 12.8 oz (52.5 kg)   SpO2 98%   BMI 21.53 kg/m   BP Readings from Last 3 Encounters:  08/08/22 120/72  08/06/22 126/66  07/31/22 106/64    Wt Readings from Last 3 Encounters:  08/08/22 115 lb 12.8 oz (52.5 kg)  07/29/22 121 lb (54.9 kg)  06/27/22 124 lb (56.2 kg)    Physical Exam Constitutional:      General: She is not in acute distress.    Appearance: She is well-developed.  HENT:     Head: Normocephalic.     Right Ear: External ear normal.     Left Ear: External ear normal.     Nose: Nose normal.  Eyes:     General:        Right eye: No discharge.        Left eye: No discharge.     Conjunctiva/sclera: Conjunctivae normal.     Pupils: Pupils are equal, round, and reactive to light.  Neck:     Thyroid: No thyromegaly.     Vascular: No JVD.     Trachea: No tracheal deviation.  Cardiovascular:     Rate and Rhythm: Normal rate and regular rhythm.     Heart sounds: Normal heart sounds.  Pulmonary:     Effort: No respiratory distress.     Breath sounds: No stridor. No wheezing.  Abdominal:     General: Bowel sounds are normal. There is no distension.     Palpations: Abdomen is soft. There is no mass.     Tenderness: There is no abdominal tenderness. There is no guarding or rebound.  Musculoskeletal:        General: No tenderness.     Cervical back: Normal range of motion and neck supple. No rigidity.  Lymphadenopathy:     Cervical: No cervical adenopathy.  Skin:    Findings: No erythema or rash.  Neurological:     Cranial Nerves: No cranial nerve deficit.     Motor: No abnormal muscle tone.     Coordination: Coordination normal.     Deep Tendon Reflexes: Reflexes normal.  Psychiatric:        Behavior: Behavior normal.        Thought Content: Thought content normal.        Judgment: Judgment  normal.     Lab Results  Component Value Date   WBC 6.9 06/21/2021   HGB 11.6 (  L) 06/21/2021   HCT 35.6 (L) 06/21/2021   PLT 400.0 06/21/2021   GLUCOSE 120 (H) 06/18/2022   CHOL 116 05/10/2020   TRIG 53.0 05/10/2020   HDL 71.30 05/10/2020   LDLDIRECT 48.0 06/18/2022   LDLCALC 34 05/10/2020   ALT 24 06/18/2022   AST 31 06/18/2022   NA 136 06/18/2022   K 4.1 06/18/2022   CL 101 06/18/2022   CREATININE 1.20 (H) 07/17/2022   BUN 14 06/18/2022   CO2 30 06/18/2022   TSH 0.89 12/10/2021   INR 0.93 11/02/2013   HGBA1C 7.3 (H) 06/18/2022   MICROALBUR 1.0 06/18/2022    No results found.  Assessment & Plan:   Problem List Items Addressed This Visit     Weight loss    Pt is on Victoza 0.6 mg/d per Dr Dwyane Dee (tolerating OK: less "full" and still loosing wt) Pt lost 75 lbs total.  BMI 21 now      Osteoporosis - Primary    Was on Prolia - d/c Vit D  Edwena Bunde, NP gave Hassan Rowan Reclast infusion      DM type 2, controlled, with complication ([place])    Pt is on Victoza 0.6 mg/d per Dr Dwyane Dee (tolerating OK: less "full" and still loosing wt) Pt lost 75 lbs total      CRI (chronic renal insufficiency), stage 3 (moderate) ([place])    Hydrate well Monitor GFR         No orders of the defined types were placed in this encounter.     Follow-up: Return in about 3 months (around 11/07/2022) for a follow-up visit.  Walker Kehr, MD

## 2022-08-08 NOTE — Assessment & Plan Note (Addendum)
Was on Prolia - d/c Vit D  Edwena Bunde, NP gave Hassan Rowan Reclast infusion

## 2022-08-08 NOTE — Patient Instructions (Signed)
You lost 75 lbs total.  BMI is 21 now - 07/2022

## 2022-08-19 ENCOUNTER — Other Ambulatory Visit: Payer: Self-pay | Admitting: Endocrinology

## 2022-08-19 DIAGNOSIS — E118 Type 2 diabetes mellitus with unspecified complications: Secondary | ICD-10-CM

## 2022-08-22 DIAGNOSIS — G4733 Obstructive sleep apnea (adult) (pediatric): Secondary | ICD-10-CM | POA: Diagnosis not present

## 2022-08-27 ENCOUNTER — Ambulatory Visit: Payer: PPO | Admitting: Adult Health

## 2022-08-27 ENCOUNTER — Encounter: Payer: Self-pay | Admitting: Adult Health

## 2022-08-27 VITALS — BP 129/67 | HR 85 | Ht 62.0 in | Wt 116.0 lb

## 2022-08-27 DIAGNOSIS — G4733 Obstructive sleep apnea (adult) (pediatric): Secondary | ICD-10-CM

## 2022-08-27 DIAGNOSIS — G43709 Chronic migraine without aura, not intractable, without status migrainosus: Secondary | ICD-10-CM

## 2022-08-27 MED ORDER — ONABOTULINUMTOXINA 200 UNITS IJ SOLR
155.0000 [IU] | Freq: Once | INTRAMUSCULAR | Status: AC
  Administered 2022-08-27: 155 [IU] via INTRAMUSCULAR

## 2022-08-27 NOTE — Progress Notes (Signed)
Botox- 200 units x 1 vial Lot: [id number] Expiration: 12/2024 NDC: 4097-3532-99  Bacteriostatic 0.9% Sodium Chloride- 47m total Lot: [id number]: 04/27/2023 NDC: [address]-79 Dx: GG92.119  B/B

## 2022-08-27 NOTE — Progress Notes (Signed)
   08/27/22: Late for her botox so she has had some break through headaches  04/30/22: stable   BOTOX PROCEDURE NOTE FOR MIGRAINE HEADACHE    Contraindications and precautions discussed with patient(above). Aseptic procedure was observed and patient tolerated procedure. Procedure performed by Ward Givens, NP  The condition has existed for more than 6 months, and pt does not have a diagnosis of ALS, Myasthenia Gravis or Lambert-Eaton Syndrome.  Risks and benefits of injections discussed and pt agrees to proceed with the procedure.  Written consent obtained   Indication/Diagnosis: chronic migraine BOTOX(J0585) injection was performed according to protocol by [employer]. 200 units of BOTOX was dissolved into 4 cc NS.   NDC: [address]-35    Botox- 200 units x 1 vial Lot: [id number] Expiration: 12/2024 NDC: 5400-8676-19   Bacteriostatic 0.9% Sodium Chloride- 24m total Lot: [id number]: 04/27/2023 NDC: [address]-98  Dx: GP38.250     Description of procedure:  The patient was placed in a sitting position. The standard protocol was used for Botox as follows, with 5 units of Botox injected at each site:   -Procerus muscle, midline injection  -Corrugator muscle, bilateral injection  -Frontalis muscle, bilateral injection, with 2 sites each side, medial injection was performed in the upper one third of the frontalis muscle, in the region vertical from the medial inferior edge of the superior orbital rim. The lateral injection was again in the upper one third of the forehead vertically above the lateral limbus of the cornea, 1.5 cm lateral to the medial injection site.  -Temporalis muscle injection, 4 sites, bilaterally. The first injection was 3 cm above the tragus of the ear, second injection site was 1.5 cm to 3 cm up from the first injection site in line with the tragus of the ear. The third injection site was 1.5-3 cm forward between the first 2 injection sites. The fourth  injection site was 1.5 cm posterior to the second injection site.  -Occipitalis muscle injection, 3 sites, bilaterally. The first injection was done one half way between the occipital protuberance and the tip of the mastoid process behind the ear. The second injection site was done lateral and superior to the first, 1 fingerbreadth from the first injection. The third injection site was 1 fingerbreadth superiorly and medially from the first injection site.  -Cervical paraspinal muscle injection, 2 sites, bilateral knee first injection site was 1 cm from the midline of the cervical spine, 3 cm inferior to the lower border of the occipital protuberance. The second injection site was 1.5 cm superiorly and laterally to the first injection site.  -Trapezius muscle injection was performed at 3 sites, bilaterally. The first injection site was in the upper trapezius muscle halfway between the inflection point of the neck, and the acromion. The second injection site was one half way between the acromion and the first injection site. The third injection was done between the first injection site and the inflection point of the neck.   Will return for repeat injection in 3 months.   A 200 units of Botox was used, 155 units were injected, the rest of the Botox was wasted. The patient tolerated the procedure well, there were no complications of the above procedure.  MWard Givens MSN, NP-C 08/27/2022, 10:26 AM [facility] [address] [place] [phone]
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
## 2022-08-28 DIAGNOSIS — E119 Type 2 diabetes mellitus without complications: Secondary | ICD-10-CM | POA: Diagnosis not present

## 2022-08-28 DIAGNOSIS — H524 Presbyopia: Secondary | ICD-10-CM | POA: Diagnosis not present

## 2022-08-28 DIAGNOSIS — H35033 Hypertensive retinopathy, bilateral: Secondary | ICD-10-CM | POA: Diagnosis not present

## 2022-08-28 DIAGNOSIS — H35342 Macular cyst, hole, or pseudohole, left eye: Secondary | ICD-10-CM | POA: Diagnosis not present

## 2022-08-28 DIAGNOSIS — H35372 Puckering of macula, left eye: Secondary | ICD-10-CM | POA: Diagnosis not present

## 2022-08-28 DIAGNOSIS — H04123 Dry eye syndrome of bilateral lacrimal glands: Secondary | ICD-10-CM | POA: Diagnosis not present

## 2022-08-28 LAB — HM DIABETES EYE EXAM

## 2022-08-29 ENCOUNTER — Ambulatory Visit: Payer: PPO | Admitting: Adult Health

## 2022-08-30 ENCOUNTER — Encounter: Payer: Self-pay | Admitting: Internal Medicine

## 2022-09-02 ENCOUNTER — Encounter: Payer: Self-pay | Admitting: Endocrinology

## 2022-09-09 ENCOUNTER — Telehealth: Payer: Self-pay | Admitting: Adult Health

## 2022-09-09 NOTE — Telephone Encounter (Signed)
Pt is calling. Stated she is taking medication SUMAtriptan (IMITREX) 100 MG tablet  and she is having to take two pills before it works because she having headaches weekly and it started again today. Pt said she took Ubrogepant (UBRELVY) 100 MG TABS and it made the headache worst. Pt is requesting a call back from the nurse. Pt said she is leaving for an appointment but will be back around 4:30  pm.

## 2022-09-10 NOTE — Telephone Encounter (Signed)
I called pt.  She had breakthru migraines prior to botox (as noted in procedure note).  Had botox - and she has had increased morning headaches (3 times last week and 2 times so far this week).  She has had to take sumatriptan '100mg'$  tab x 2 each time to get rid of her migraine.  She said that [place] makes things worse.  She knows that weather is a trigger for her.  She feels that they are not different from her previous migraines (pain at bilateral temples and back of head).  Her cpap was jetting cold air but she had that adjusted by [facility] to increase the temp and was able to sleep 7 hours last night.  Her migraine level starts at 8-10 then eases with sumatriptan. And then further after 2nd tablet.  Her son ENT mentioned that colleague has used gabapentin (it is on her allergy list as causing dizziness).  Do you have any other reccs?  Come in for med management appt?

## 2022-09-11 NOTE — Telephone Encounter (Signed)
LVM for patient to call back and schedule a office visit or mychart to discuss medication management    Per Megan,NP recent CPAP Report

## 2022-09-11 NOTE — Telephone Encounter (Signed)
Pt has returned the call to [facility], she is asking to be called back on [phone]

## 2022-09-11 NOTE — Telephone Encounter (Signed)
Come in or mychart for med management. Do we have a recent CPAP report

## 2022-09-11 NOTE — Telephone Encounter (Signed)
Spoke to patient per Megan,NP made office visit appointment 10/10/2022 . Pt states botox and ubrevly is not helping the migraine. Pt states rizatriptan does help. Informed patient you can take rizatriptan 3x weekly if more can cause rebound headaches . Informed patient  if migraines continue please go to Urgent care or ED to get evaluated.. Pt expressed understanding and thanked me for calling

## 2022-09-12 ENCOUNTER — Other Ambulatory Visit: Payer: Self-pay

## 2022-09-12 ENCOUNTER — Encounter (HOSPITAL_BASED_OUTPATIENT_CLINIC_OR_DEPARTMENT_OTHER): Payer: Self-pay

## 2022-09-12 ENCOUNTER — Ambulatory Visit
Admission: EM | Admit: 2022-09-12 | Discharge: 2022-09-12 | Disposition: A | Discharge status: Home / Self Care | Payer: PPO

## 2022-09-12 ENCOUNTER — Ambulatory Visit: Payer: PPO

## 2022-09-12 ENCOUNTER — Emergency Department (HOSPITAL_BASED_OUTPATIENT_CLINIC_OR_DEPARTMENT_OTHER)
Admission: EM | Admit: 2022-09-12 | Discharge: 2022-09-12 | Disposition: A | Discharge status: Home / Self Care | Payer: PPO | Attending: Emergency Medicine | Admitting: Emergency Medicine

## 2022-09-12 DIAGNOSIS — R519 Headache, unspecified: Secondary | ICD-10-CM | POA: Diagnosis present

## 2022-09-12 DIAGNOSIS — Z7982 Long term (current) use of aspirin: Secondary | ICD-10-CM | POA: Diagnosis not present

## 2022-09-12 DIAGNOSIS — G43709 Chronic migraine without aura, not intractable, without status migrainosus: Secondary | ICD-10-CM | POA: Diagnosis not present

## 2022-09-12 DIAGNOSIS — G43711 Chronic migraine without aura, intractable, with status migrainosus: Secondary | ICD-10-CM | POA: Diagnosis not present

## 2022-09-12 DIAGNOSIS — G43811 Other migraine, intractable, with status migrainosus: Secondary | ICD-10-CM

## 2022-09-12 DIAGNOSIS — E119 Type 2 diabetes mellitus without complications: Secondary | ICD-10-CM | POA: Diagnosis not present

## 2022-09-12 MED ORDER — SODIUM CHLORIDE 0.9 % IV BOLUS
500.0000 mL | Freq: Once | INTRAVENOUS | Status: AC
  Administered 2022-09-12: 500 mL via INTRAVENOUS

## 2022-09-12 MED ORDER — METOCLOPRAMIDE HCL 5 MG/ML IJ SOLN
10.0000 mg | Freq: Once | INTRAMUSCULAR | Status: AC
  Administered 2022-09-12: 10 mg via INTRAVENOUS
  Filled 2022-09-12: qty 2

## 2022-09-12 MED ORDER — SODIUM CHLORIDE 0.9 % IV SOLN: INTRAVENOUS | Status: DC

## 2022-09-12 MED ORDER — DIPHENHYDRAMINE HCL 50 MG/ML IJ SOLN
25.0000 mg | Freq: Once | INTRAMUSCULAR | Status: AC
  Administered 2022-09-12: 25 mg via INTRAVENOUS
  Filled 2022-09-12: qty 1

## 2022-09-12 MED ORDER — DEXAMETHASONE SODIUM PHOSPHATE 10 MG/ML IJ SOLN
10.0000 mg | Freq: Once | INTRAMUSCULAR | Status: AC
  Administered 2022-09-12: 10 mg via INTRAVENOUS
  Filled 2022-09-12: qty 1

## 2022-09-12 NOTE — Discharge Instructions (Signed)
Please go to the emergency room for further evaluation and treatment of your migraine

## 2022-09-12 NOTE — ED Notes (Signed)
Pt states pain is somewhat better

## 2022-09-12 NOTE — ED Notes (Signed)
Lights dimmed upon request
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
## 2022-09-12 NOTE — ED Triage Notes (Signed)
Pt c/o intermittent migraine for a few weeks.   Home interventions: sumatriptan

## 2022-09-12 NOTE — ED Provider Notes (Addendum)
[facility] EMERGENCY DEPT Provider Note   CSN: [id number] Arrival date & time: 09/12/22  1205     History  Chief Complaint  Patient presents with   Headache    [name] is a 82 y.o. female.  Patient followed by [facility] neurology G.  Normally gets Botox injections to help with chronic migraines.  Patient suffered with migraines when she was younger.  Had a period of time when they were not as frequent and here lately they have become more frequent.  Clinically this is classic for her migraines bilateral temporal pain radiating to the back of the head.  Is been struggling with this on and off for 2 to 3 weeks.  But it is much worse in the last few days.  Associated with some photophobia and some nausea but no vomiting.  No numbness no weakness no neck stiffness no fevers.  No visual changes no speech problems.  Past medical history significant for anxiety depression gastroesophageal reflux disease irritable bowel syndrome type 2 diabetes with a history of migraines hyperlipidemia sleep apnea.  Patient does not use tobacco products.       Home Medications Prior to Admission medications   Medication Sig Start Date End Date Taking? Authorizing Provider  acetaminophen (TYLENOL) 325 MG tablet Take 325 mg by mouth 2 (two) times daily as needed. Takes with tramadol    [provider]  aspirin EC 81 MG tablet Take 1 tablet (81 mg total) by mouth daily. 12/01/13   Plotnikov, Evie Lacks, MD  BD PEN NEEDLE NANO U/F 32G X 4 MM MISC USE AS DIRECTED TWICE DAILY. 03/06/22   Elayne Snare, MD  Blood Glucose Monitoring Suppl ([place]) w/Device KIT Use as instructed to check blood sugar 5 times daily. 12/07/18   Elayne Snare, MD  busPIRone (BUSPAR) 15 MG tablet Take 1 tablet by mouth 2 (two) times daily. 01/28/16   [provider]  cetirizine (ZYRTEC) 10 MG tablet Take 10 mg by mouth daily as needed for allergies.    [provider]  dicyclomine  (BENTYL) 10 MG capsule Take 1 capsule (10 mg total) by mouth 3 (three) times daily before meals. 07/29/22   Ladene Artist, MD  famotidine (PEPCID) 40 MG tablet Take 1 tablet (40 mg total) by mouth at bedtime. 07/29/22   Ladene Artist, MD  L-Methylfolate (DEPLIN) 15 MG TABS Take 15 mg by mouth daily. For treatment of depression    [provider]  Lancet Devices (ONE TOUCH DELICA LANCING DEV) MISC Use daily as directed 05/16/20   Elayne Snare, MD  [facility] PAIN RELIEF PATCH EX) Apply 1 patch topically at bedtime.    [provider]  liraglutide (VICTOZA) 18 MG/3ML SOPN Inject 0.6 mg into the skin daily. 08/02/22   Elayne Snare, MD  LORazepam (ATIVAN) 1 MG tablet Take 1 mg by mouth 4 (four) times daily as needed for anxiety. Reports to taking 1/2 tablet at breakfast and lunch and 1 tablet at bedtime 08/01/15   [provider]  Magnesium Hydroxide (MILK OF MAGNESIA PO) Take 1-2 capsules by mouth daily as needed.    [provider]  [facility] DELICA LANCETS 52W MISC USE UP TO 4 TIMES A DAY 05/18/18   Elayne Snare, MD  [facility] VERIO test strip Use as instructed to check blood sugar once a day 10/01/21   Elayne Snare, MD  pantoprazole (PROTONIX) 40 MG tablet Take 1 tablet (40 mg total) by mouth 2 (  two) times daily. 07/29/22   Ladene Artist, MD  pioglitazone (ACTOS) 30 MG tablet TAKE 1 TABLET BY MOUTH EVERY DAY 04/08/22   Elayne Snare, MD  repaglinide (PRANDIN) 1 MG tablet TAKE 2 TABLETS BY MOUTH BEFORE BREAKFAST, 1 TABLET AT LUNCH AND 1 TABLET AT [facility] 08/21/22   Elayne Snare, MD  SUMAtriptan (IMITREX) 100 MG tablet Take 1 tablet (100 mg total) by mouth every 2 (two) hours as needed. Max twice a day. 11/19/21   Melvenia Beam, MD  traMADol (ULTRAM) 50 MG tablet TAKE 1 TABLET (50 MG TOTAL) BY MOUTH EVERY 12 (TWELVE) HOURS AS NEEDED FOR SEVERE PAIN. 07/30/22   Plotnikov, Evie Lacks, MD  TRINTELLIX 20 MG TABS Take 10 mg by mouth daily. Take 10 mg daily 08/15/16    [provider]  Ubrogepant (UBRELVY) 100 MG TABS Take 1 tablet at the onset of a migraine can repeat in 2 hours if needed 05/28/22   Ward Givens, NP  VOLTAREN 1 % GEL APPLY 2 GRAMS TO KNEE 2-3 TIMES DAILY PRN 05/25/15   [provider]      Allergies    Amlodipine, Celebrex [celecoxib], Topamax [topiramate], Amitiza [lubiprostone], Carafate [sucralfate], Jardiance [empagliflozin], Lactulose, Codeine, Gabapentin, Hydrocodone, Meloxicam, and Pravastatin sodium    Review of Systems   Review of Systems  Constitutional:  Negative for chills and fever.  HENT:  Negative for ear pain and sore throat.   Eyes:  Positive for photophobia. Negative for pain and visual disturbance.  Respiratory:  Negative for cough and shortness of breath.   Cardiovascular:  Negative for chest pain and palpitations.  Gastrointestinal:  Positive for nausea. Negative for abdominal pain and vomiting.  Genitourinary:  Negative for dysuria and hematuria.  Musculoskeletal:  Negative for arthralgias, back pain and neck pain.  Skin:  Negative for color change and rash.  Neurological:  Positive for headaches. Negative for dizziness, seizures, syncope, facial asymmetry, speech difficulty, weakness and numbness.  All other systems reviewed and are negative.   Physical Exam Updated Vital Signs BP 136/66   Pulse 83   Temp 97.6 F (36.4 C) (Oral)   Resp 18   Ht 1.575 m ('5\' 2"'$ )   Wt 52.6 kg   SpO2 100%   BMI 21.21 kg/m  Physical Exam Vitals and nursing note reviewed.  Constitutional:      General: She is not in acute distress.    Appearance: She is well-developed. She is not ill-appearing or diaphoretic.  HENT:     Head: Normocephalic and atraumatic.  Eyes:     General: No visual field deficit.    Extraocular Movements: Extraocular movements intact.     Conjunctiva/sclera: Conjunctivae normal.     Pupils: Pupils are equal, round, and reactive to light.  Cardiovascular:     Rate and Rhythm:  Normal rate and regular rhythm.     Heart sounds: No murmur heard. Pulmonary:     Effort: Pulmonary effort is normal. No respiratory distress.     Breath sounds: Normal breath sounds.  Abdominal:     Palpations: Abdomen is soft.     Tenderness: There is no abdominal tenderness.  Musculoskeletal:        General: No swelling.     Cervical back: Normal range of motion and neck supple.  Skin:    General: Skin is warm and dry.     Capillary Refill: Capillary refill takes less than 2 seconds.  Neurological:     Mental Status: She is alert  and oriented to person, place, and time.     GCS: GCS eye subscore is 4. GCS verbal subscore is 5. GCS motor subscore is 6.     Cranial Nerves: No cranial nerve deficit, dysarthria or facial asymmetry.     Sensory: No sensory deficit.     Motor: No weakness.     Coordination: Coordination normal.  Psychiatric:        Mood and Affect: Mood normal.        Speech: Speech normal.     ED Results / Procedures / Treatments   Labs (all labs ordered are listed, but only abnormal results are displayed) Labs Reviewed - No data to display  EKG None  Radiology No results found.  Procedures Procedures    Medications Ordered in ED Medications  sodium chloride 0.9 % bolus 500 mL (has no administration in time range)  0.9 %  sodium chloride infusion (has no administration in time range)  dexamethasone (DECADRON) injection 10 mg (has no administration in time range)  diphenhydrAMINE (BENADRYL) injection 25 mg (has no administration in time range)  metoCLOPramide (REGLAN) injection 10 mg (has no administration in time range)    ED Course/ Medical Decision Making/ A&P                             Medical Decision Making Risk Prescription drug management.   Patient sent in by [facility] neurology for migraine cocktail.  Patient went to urgent care earlier they were unable to do that there.  So sent him here.  Migraine seems to be typical for her type  of migraine.  No signs of any other concerning illness.  Neuroexam without any neurodeficits just little bit of photophobia.  I will treat with migraine cocktail IV fluids Decadron Benadryl and Reglan.  Patient tolerated the migraine cocktail well.  Some slight improvement.  But hopefully the Decadron will kick in and should be feeling much better by tomorrow.  Nontoxic no acute distress.   Final Clinical Impression(s) / ED Diagnoses Final diagnoses:  Intractable chronic migraine without aura and with status migrainosus    Rx / DC Orders ED Discharge Orders     None         Fredia Sorrow, MD 09/12/22 1410    Fredia Sorrow, MD 09/12/22 1512

## 2022-09-12 NOTE — Discharge Instructions (Signed)
Make an appointment to follow back up with [facility] neurology.  Go home and rest in a dark room.  Hopefully the long-acting steroid will kick in and the migraine will be resolved by morning.  Return for any new or worse symptoms.

## 2022-09-12 NOTE — ED Triage Notes (Signed)
Patient here POV from UC.  Endorses Intermittent Headache for 2-3 Weeks. Given Treatment by Neurologist such as Botox Injections which have not been effective.   Sent by UC for Further Treatment. Some Nausea. No Emesis. Some Photosensitivity. No fevers.  NAD Noted during Triage. A&OX4. GCS 15. Ambulatory.

## 2022-09-12 NOTE — ED Notes (Signed)
Patient is being discharged from the Urgent Care and sent to the Emergency Department via POV with spouse . Per Rosario Adie NP, patient is in need of higher level of care due to need for further evaluation . Patient is aware and verbalizes understanding of plan of care.  Vitals:   09/12/22 1104  BP: 126/69  Pulse: 84  Resp: 16  Temp: (!) 97.4 F (36.3 C)  SpO2: 97%

## 2022-09-16 ENCOUNTER — Ambulatory Visit: Payer: PPO | Admitting: Gastroenterology

## 2022-09-17 ENCOUNTER — Other Ambulatory Visit: Payer: PPO

## 2022-09-19 ENCOUNTER — Other Ambulatory Visit: Payer: PPO

## 2022-09-20 ENCOUNTER — Ambulatory Visit: Payer: PPO | Admitting: Endocrinology

## 2022-09-22 DIAGNOSIS — G4733 Obstructive sleep apnea (adult) (pediatric): Secondary | ICD-10-CM | POA: Diagnosis not present

## 2022-09-25 NOTE — Telephone Encounter (Signed)
Pt called again stating that she is now feeling dizziness in the back of the head and she received the cocktail on 09/12/22 but it wore off quickly. Pt would like to speak to the MD not the NP. Please advise.

## 2022-09-26 ENCOUNTER — Other Ambulatory Visit (INDEPENDENT_AMBULATORY_CARE_PROVIDER_SITE_OTHER): Payer: PPO

## 2022-09-26 DIAGNOSIS — E1165 Type 2 diabetes mellitus with hyperglycemia: Secondary | ICD-10-CM | POA: Diagnosis not present

## 2022-09-26 LAB — BASIC METABOLIC PANEL
BUN: 15 mg/dL (ref 6–23)
CO2: 30 mEq/L (ref 19–32)
Calcium: 9.8 mg/dL (ref 8.4–10.5)
Chloride: 98 mEq/L (ref 96–112)
Creatinine, Ser: 1.14 mg/dL (ref 0.40–1.20)
GFR: 45.28 mL/min — ABNORMAL LOW (ref >60.00)
Glucose, Bld: 135 mg/dL — ABNORMAL HIGH (ref 70–99)
Potassium: 4 mEq/L (ref 3.5–5.1)
Sodium: 136 mEq/L (ref 135–145)

## 2022-09-26 LAB — HEMOGLOBIN A1C: Hgb A1c MFr Bld: 7.5 % — ABNORMAL HIGH (ref 4.6–6.5)

## 2022-09-26 MED ORDER — ONDANSETRON HCL 4 MG PO TABS: 4.0000 mg | ORAL_TABLET | Freq: Three times a day (TID) | ORAL | 0 refills | Status: DC | PRN

## 2022-09-26 NOTE — Telephone Encounter (Signed)
Pt called back and is at another appt and will call back later.

## 2022-09-26 NOTE — Telephone Encounter (Signed)
Pt called back.  She has had headaches since last botox , has called previously.  She has tried sumatriptan daily was told only to take 3x week due to rebound, then has taken excedrin migraine which has helped some.  She now has dizziness in back and sides of her head, (which she says she has had with her migraines).  Went to ED/ [facility] given migraine cocktail which helped for a few days (with headache and dizziness).  She has appt 10-10-2022 with MM/NP for med management.  She tried [place] and has not helped this time, although samples previously did help.  She has nausea no vomiting.  She is asking for medication for dizziness? There are no earlier appts, at this time, she is on waitlist.   Please advise.  I will call her home # when I return call.

## 2022-09-26 NOTE — Telephone Encounter (Signed)
I have attempted to speak to pt several times/  phone connection is not good.  Was seen in ED 09-12-2022 and had cocktail for migraine.  She is now experiencing dizziness back and side of head.

## 2022-09-26 NOTE — Addendum Note (Signed)
Addended by: Brandon Melnick on: 09/26/2022 05:01 PM   Modules accepted: Orders

## 2022-09-26 NOTE — Telephone Encounter (Signed)
I called pt and relayed that MM/NP wanted her to try ondansetron for nausea/may help dizziness.  I said may make drowsy.  She appreciated call back and hope this helps.

## 2022-09-27 DIAGNOSIS — G4733 Obstructive sleep apnea (adult) (pediatric): Secondary | ICD-10-CM | POA: Diagnosis not present

## 2022-09-30 ENCOUNTER — Encounter: Payer: Self-pay | Admitting: Endocrinology

## 2022-09-30 ENCOUNTER — Ambulatory Visit: Payer: PPO | Admitting: Endocrinology

## 2022-09-30 VITALS — BP 120/80 | HR 68 | Ht 62.0 in | Wt 116.4 lb

## 2022-09-30 DIAGNOSIS — R634 Abnormal weight loss: Secondary | ICD-10-CM | POA: Diagnosis not present

## 2022-09-30 DIAGNOSIS — E1165 Type 2 diabetes mellitus with hyperglycemia: Secondary | ICD-10-CM | POA: Diagnosis not present

## 2022-09-30 MED ORDER — FREESTYLE LIBRE 3 READER DEVI: 1.0000 | Freq: Once | 0 refills | Status: AC

## 2022-09-30 MED ORDER — FREESTYLE LIBRE 3 SENSOR MISC: 1.0000 | 2 refills | Status: DC

## 2022-09-30 MED ORDER — INSULIN LISPRO (1 UNIT DIAL) 100 UNIT/ML (KWIKPEN): PEN_INJECTOR | SUBCUTANEOUS | 1 refills | Status: DC

## 2022-09-30 NOTE — Telephone Encounter (Signed)
Spoke to pt and she said that her headaches and dizziness no better.  Zofran made her worse.  She has taken sumatriptan, excedrin migraine last 3 days.  Wakes up with headache.  Made appt to discuss 10-02-2022 at 1330 with MM/NP.

## 2022-09-30 NOTE — Patient Instructions (Addendum)
Stop Victoza   Humalog 3-4 units before meals

## 2022-09-30 NOTE — Telephone Encounter (Signed)
Can we call and check on patient and see if headache is better
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
## 2022-10-02 ENCOUNTER — Encounter: Payer: Self-pay | Admitting: Adult Health

## 2022-10-02 ENCOUNTER — Ambulatory Visit: Payer: PPO | Admitting: Adult Health

## 2022-10-02 VITALS — BP 121/66 | HR 85 | Ht 62.5 in | Wt 116.0 lb

## 2022-10-02 DIAGNOSIS — G4733 Obstructive sleep apnea (adult) (pediatric): Secondary | ICD-10-CM | POA: Diagnosis not present

## 2022-10-02 DIAGNOSIS — G43709 Chronic migraine without aura, not intractable, without status migrainosus: Secondary | ICD-10-CM | POA: Diagnosis not present
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
## 2022-10-02 NOTE — Telephone Encounter (Signed)
I spoke with the patient's pharmacy and they stated that Humalog that was sent in is actually covered under her plan and is ready for pickup and $35 per box that will last the patient approx 60 days.

## 2022-10-06 ENCOUNTER — Other Ambulatory Visit: Payer: Self-pay | Admitting: Endocrinology

## 2022-10-07 ENCOUNTER — Encounter: Payer: Self-pay | Admitting: Adult Health

## 2022-10-08 ENCOUNTER — Ambulatory Visit: Payer: PPO | Admitting: Gastroenterology

## 2022-10-08 ENCOUNTER — Encounter: Payer: Self-pay | Admitting: Endocrinology

## 2022-10-08 NOTE — Telephone Encounter (Signed)
Dr. Jaynee Eagles,  Patient is having continuous headaches. She went to Urgent care had coctail only helped intermittently. I advised her to try ubrelvy daily for 3-4 days that helped but headaches came back. She is diabetic so I didn't do prednisone. Her blood sugars have been running high >180. Pcp starting insulin but she has not started it yet. Do you think the blood sugar could be contributing? Any other suggestions?

## 2022-10-09 ENCOUNTER — Encounter: Payer: PPO | Attending: Endocrinology | Admitting: Nutrition

## 2022-10-09 ENCOUNTER — Encounter: Payer: Self-pay | Admitting: Adult Health

## 2022-10-09 ENCOUNTER — Telehealth: Payer: Self-pay

## 2022-10-09 DIAGNOSIS — E118 Type 2 diabetes mellitus with unspecified complications: Secondary | ICD-10-CM | POA: Insufficient documentation

## 2022-10-09 NOTE — Telephone Encounter (Signed)
Patient states that she has called several times and waiting to be schedule for appointment.

## 2022-10-09 NOTE — Telephone Encounter (Signed)
LVM to call me to schedule insulin training

## 2022-10-10 ENCOUNTER — Ambulatory Visit: Payer: PPO | Admitting: Adult Health

## 2022-10-10 NOTE — Progress Notes (Signed)
Patient was trained on the use of the [place] 3 sensor.  We discussed the difference between sensor readings and CGM readings and when it is necessary to do a fingerstick.   We also discussed how to inject the insulin prescribe and signs, symptoms, and treatment of low blood sugars. We also reviewed how to adjust the insulin dose by reviewing the 2 hour pc readings and when meal size varies. Discussed what carbs are, and their appropriate portions sizes.  It appears she is eating 45 grams at supper meal, which includes her bedtime snack which is 15 grams.  List of carbs given with 15 gram portions sizes, as well as sheet on need for 3 basic food groups at each meal and how to read labels.  They reported good understanding of all topics discussed and had not final questions.

## 2022-10-10 NOTE — Patient Instructions (Signed)
Take 3-4u on insulin 10 minutes before supper meal Increase or decrease dose of insulin by 1u if 2 hours after the meal are over 180. Increase or decrease the insulin dose by 1u of meal size increases or decreases in the amount of carbohydrates eaten.

## 2022-10-15 ENCOUNTER — Telehealth: Payer: Self-pay | Admitting: Nutrition

## 2022-10-15 NOTE — Telephone Encounter (Signed)
Patient reports blood sugars dropping into the 70s 3-4 hours after supper.  She is taking 4u 10 minutes before eating. Plan:  decrease dose to 3u per Dr. Ronnie Derby orders of 3-4u ac meals.

## 2022-10-18 ENCOUNTER — Telehealth: Payer: Self-pay

## 2022-10-18 NOTE — Patient Outreach (Signed)
  Care Coordination   Initial Visit Note   10/18/2022 Name: [name] MRN: [id number] DOB: 02/17/1941  [name] is a 82 y.o. year old female who sees Plotnikov, Evie Lacks, MD for primary care. I spoke with husband, B. [employer]) by phone today.  What matters to the patients health and wellness today?  Per Mr. Denninger, patient is resting. RNCM to call next week to see if any care coordination needs next week.   Goals Addressed             This Visit's Progress    care coordination activities       Interventions Today    Flowsheet Row Most Recent Value  General Interventions   General Interventions Discussed/Reviewed General Interventions Discussed  [briefly discussed care coordination program with patient's husband(dpr). Patient scheduled to contact next week]            SDOH assessments and interventions completed:  No  Care Coordination Interventions:  Yes, provided   Follow up plan: Follow up call scheduled for 10/21/22 12:30 pm    Encounter Outcome:  Pt. Visit Completed   Thea Silversmith, RN, MSN, BSN, [place] Coordinator [phone]

## 2022-10-21 ENCOUNTER — Telehealth: Payer: Self-pay

## 2022-10-21 NOTE — Patient Outreach (Signed)
  Care Coordination   Care Coordination  Visit Note   10/21/2022 Name: [name] MRN: [id number] DOB: 17-Jul-1941  [name] is a 82 y.o. year old female who sees Plotnikov, Evie Lacks, MD for primary care. I spoke with [name]) by phone today.  What matters to the patients health and wellness today?  RNCM called for scheduled telephone assessment. Spoke with [name] who request to reschedule assessment. He reports [name] does not feel like talking and that her blood sugar is up. He request to reschedule telephone call. [name] could be heard in the background confirming next appointment time as ok. [name] states he is going to contact endocrinologist regarding patient's blood sugar.   Goals Addressed             This Visit's Progress    care coordination activities       Interventions Today    Flowsheet Row Most Recent Value  Chronic Disease   Chronic disease during today's visit Diabetes  General Interventions   General Interventions Discussed/Reviewed General Interventions Discussed  [reiterated care coordination services. RNCM provided contact number and encouraged to call for care coordination needs with questions]  Education Interventions   Education Provided Provided Education  [encouraged Husband to contact endocrinologist regarding blood sugar concerns.]            SDOH assessments and interventions completed:  No  Care Coordination Interventions:  Yes, provided   Follow up plan: Follow up call scheduled for 11/05/22    Encounter Outcome:  Pt. Visit Completed   Thea Silversmith, RN, MSN, BSN, [place] Coordinator [phone]

## 2022-10-22 ENCOUNTER — Telehealth: Payer: Self-pay | Admitting: Nutrition

## 2022-10-22 NOTE — Telephone Encounter (Signed)
Message left on my machine that she had questions for me.  When I phoned her back, she said she could not remember the questions from yesterday, and that she has gotten a virus, and is not feeling well.  Says is using the sensors, and taking her insulin without difficulty.  Says blood sugars are higher than usual.  Explained that illness will raise blood sugar and that she may need 1-2u extra of the before meal insulin if blood sugars are over 150-160.  She said she would do that.  She will call back if she remembers that questions.

## 2022-10-23 DIAGNOSIS — G4733 Obstructive sleep apnea (adult) (pediatric): Secondary | ICD-10-CM | POA: Diagnosis not present

## 2022-10-24 ENCOUNTER — Telehealth: Payer: Self-pay | Admitting: Dietician

## 2022-10-24 NOTE — Telephone Encounter (Signed)
Returned patient call.  Uses the FreeStyle Libre 3. She has a virus and has had very high sensor glucose readings (375). Sensor reading 10/23/2022 prior to dinner was 267.  She took 4 units of Humalog insulin and at 5:30 pm ate baked spaghetti, applesauce, crackers, water (about 75 grams of carbs).  7:20 pm sensor glucose was at 56, drank 1/2 cup OJ and ate cookie.  Glucose increased to 63 and at 8:30 pm was 81.  10 pm, sensor reading was 86.  Alarm went off overnight but she ignored it.  Fasting sensor reading was 82 this am.  She previously had been instructed to take 3 units prior to dinner but that she may need to increase this 1-2 units due to increased sensor glucose readings due to illness.    Instructed patient to have a snack with protein and carbohydrate when glucose returns to normal after a low and particularly before bed.  Will message MD re:  further insulin instructions.  Antonieta Iba, RD, LDN, CDCES

## 2022-10-31 ENCOUNTER — Ambulatory Visit: Payer: PPO

## 2022-10-31 ENCOUNTER — Ambulatory Visit: Payer: PPO | Admitting: Adult Health

## 2022-11-05 ENCOUNTER — Ambulatory Visit: Payer: Self-pay

## 2022-11-05 NOTE — Patient Outreach (Signed)
  Care Coordination   Initial Visit Note   11/05/2022 Name: [name] MRN: [id number] DOB: February 28, 1941  [name] is a 82 y.o. year old female who sees [name], Cheryl Lacks, MD for primary care. I spoke with  [name] by phone today.  What matters to the patients health and wellness today?  [name] states this is not a good time to talk. She states she has an appointment with PCP this week 11/07/22 and request RNCM call her back next week.   SDOH assessments and interventions completed:  No  Care Coordination Interventions:  No, not indicated   Follow up plan: Follow up call scheduled for 11/13/22    Encounter Outcome:  Pt. Visit Completed   Cheryl Silversmith, RN, MSN, BSN, [place] Coordinator [phone]

## 2022-11-07 ENCOUNTER — Ambulatory Visit (INDEPENDENT_AMBULATORY_CARE_PROVIDER_SITE_OTHER): Payer: PPO | Admitting: Internal Medicine

## 2022-11-07 ENCOUNTER — Encounter: Payer: Self-pay | Admitting: Internal Medicine

## 2022-11-07 VITALS — BP 140/80 | HR 88 | Temp 98.1°F | Ht 62.5 in | Wt 119.0 lb

## 2022-11-07 DIAGNOSIS — G8929 Other chronic pain: Secondary | ICD-10-CM

## 2022-11-07 DIAGNOSIS — E118 Type 2 diabetes mellitus with unspecified complications: Secondary | ICD-10-CM

## 2022-11-07 DIAGNOSIS — M25362 Other instability, left knee: Secondary | ICD-10-CM

## 2022-11-07 DIAGNOSIS — M5440 Lumbago with sciatica, unspecified side: Secondary | ICD-10-CM

## 2022-11-07 DIAGNOSIS — N183 Chronic kidney disease, stage 3 unspecified: Secondary | ICD-10-CM

## 2022-11-07 NOTE — Assessment & Plan Note (Signed)
Stable Continue w/Tramadol prn  Potential benefits of a long term Tramadol/opioids use as well as potential risks (i.e. addiction risk, apnea etc) and complications (i.e. Somnolence, constipation and others) were explained to the patient and were aknowledged.

## 2022-11-07 NOTE — Assessment & Plan Note (Signed)
Walk more to strengthen muscles Sleeve brace if needed

## 2022-11-07 NOTE — Assessment & Plan Note (Signed)
Hydrate well ?Monitor GFR ?

## 2022-11-07 NOTE — Assessment & Plan Note (Signed)
Better on Insulin and w/Libre 3 use

## 2022-11-07 NOTE — Progress Notes (Signed)
Subjective:  Patient ID: [name], female    DOB: 03/14/1941  Age: 82 y.o. MRN: [id number]  CC: No chief complaint on file.   HPI [name] presents for DM, depression C/o L knee wants to give way x weeks On Insulin - Dr Dwyane Dee  Outpatient Medications Prior to Visit  Medication Sig Dispense Refill   acetaminophen (TYLENOL) 325 MG tablet Take 325 mg by mouth 2 (two) times daily as needed. Takes with tramadol     aspirin EC 81 MG tablet Take 1 tablet (81 mg total) by mouth daily. 100 tablet 3   BD PEN NEEDLE NANO U/F 32G X 4 MM MISC USE AS DIRECTED TWICE DAILY. 200 each 3   busPIRone (BUSPAR) 15 MG tablet Take 1 tablet by mouth 2 (two) times daily.     cetirizine (ZYRTEC) 10 MG tablet Take 10 mg by mouth daily as needed for allergies.     Continuous Blood Gluc Sensor (FREESTYLE LIBRE 3 SENSOR) MISC 1 Device by Does not apply route every 14 (fourteen) days. Apply 1 sensor on upper arm every 14 days for continuous glucose monitoring 2 each 2   dicyclomine (BENTYL) 10 MG capsule Take 1 capsule (10 mg total) by mouth 3 (three) times daily before meals. (Patient not taking: Reported on 10/02/2022) 270 capsule 3   famotidine (PEPCID) 40 MG tablet Take 1 tablet (40 mg total) by mouth at bedtime. 90 tablet 3   insulin lispro (HUMALOG KWIKPEN) 100 UNIT/ML KwikPen 8 units ac tid (Patient not taking: Reported on 10/02/2022) 15 mL 1   L-Methylfolate (DEPLIN) 15 MG TABS Take 15 mg by mouth daily. For treatment of depression     Liniments (SALONPAS PAIN RELIEF PATCH EX) Apply 1 patch topically at bedtime.     liraglutide (VICTOZA) 18 MG/3ML SOPN Inject 0.6 mg into the skin daily. (Patient not taking: Reported on 10/02/2022) 9 mL 1   LORazepam (ATIVAN) 1 MG tablet Take 1 mg by mouth 4 (four) times daily as needed for anxiety. Reports to taking 1/2 tablet at breakfast and lunch and 1 tablet at bedtime  2   Magnesium Hydroxide (MILK OF MAGNESIA PO) Take 1-2 capsules by mouth daily as needed.      ondansetron (ZOFRAN) 4 MG tablet Take 1 tablet (4 mg total) by mouth every 8 (eight) hours as needed for nausea or vomiting. (Patient not taking: Reported on 10/02/2022) 20 tablet 0   ONETOUCH VERIO test strip Use as instructed to check blood sugar once a day 100 each 3   pantoprazole (PROTONIX) 40 MG tablet Take 1 tablet (40 mg total) by mouth 2 (two) times daily. 180 tablet 3   pioglitazone (ACTOS) 30 MG tablet TAKE 1 TABLET BY MOUTH EVERY DAY 90 tablet 1   repaglinide (PRANDIN) 1 MG tablet TAKE 2 TABLETS BY MOUTH BEFORE BREAKFAST, 1 TABLET AT LUNCH AND 1 TABLET AT DINNER 360 tablet 1   SUMAtriptan (IMITREX) 100 MG tablet Take 1 tablet (100 mg total) by mouth every 2 (two) hours as needed. Max twice a day. 9 tablet 11   traMADol (ULTRAM) 50 MG tablet TAKE 1 TABLET (50 MG TOTAL) BY MOUTH EVERY 12 (TWELVE) HOURS AS NEEDED FOR SEVERE PAIN. 180 tablet 1   TRINTELLIX 20 MG TABS Take 10 mg by mouth daily. Take 10 mg daily  2   Ubrogepant (UBRELVY) 100 MG TABS Take 1 tablet at the onset of a migraine can repeat in 2 hours if needed (Patient not  taking: Reported on 10/02/2022) 15 tablet 11   VOLTAREN 1 % GEL APPLY 2 GRAMS TO KNEE 2-3 TIMES DAILY PRN  1   Blood Glucose Monitoring Suppl ([place]) w/Device KIT Use as instructed to check blood sugar 5 times daily. 1 kit 0   Lancet Devices (ONE TOUCH DELICA LANCING DEV) MISC Use daily as directed 1 each 0   ONETOUCH DELICA LANCETS 99991111 MISC USE UP TO 4 TIMES A DAY 200 each 3   No facility-administered medications prior to visit.    ROS: Review of Systems  Constitutional:  Negative for activity change, appetite change, chills, fatigue and unexpected weight change.  HENT:  Negative for congestion, mouth sores and sinus pressure.   Eyes:  Negative for visual disturbance.  Respiratory:  Negative for cough and chest tightness.   Gastrointestinal:  Negative for abdominal pain and nausea.  Genitourinary:  Negative for difficulty urinating,  frequency and vaginal pain.  Musculoskeletal:  Positive for arthralgias and gait problem. Negative for back pain.  Skin:  Negative for pallor and rash.  Neurological:  Negative for dizziness, tremors, weakness, numbness and headaches.  Psychiatric/Behavioral:  Negative for confusion, sleep disturbance and suicidal ideas. The patient is nervous/anxious.     Objective:  BP (!) 140/80 (BP Location: Right Arm, Patient Position: Sitting, Cuff Size: Normal)   Pulse 88   Temp 98.1 F (36.7 C) (Oral)   Ht 5' 2.5" (1.588 m)   Wt 119 lb (54 kg)   SpO2 94%   BMI 21.42 kg/m   BP Readings from Last 3 Encounters:  11/07/22 (!) 140/80  10/02/22 121/66  09/30/22 120/80    Wt Readings from Last 3 Encounters:  11/07/22 119 lb (54 kg)  10/02/22 116 lb (52.6 kg)  09/30/22 116 lb 6.4 oz (52.8 kg)    Physical Exam Constitutional:      General: She is not in acute distress.    Appearance: She is well-developed.  HENT:     Head: Normocephalic.     Right Ear: External ear normal.     Left Ear: External ear normal.     Nose: Nose normal.  Eyes:     General:        Right eye: No discharge.        Left eye: No discharge.     Conjunctiva/sclera: Conjunctivae normal.     Pupils: Pupils are equal, round, and reactive to light.  Neck:     Thyroid: No thyromegaly.     Vascular: No JVD.     Trachea: No tracheal deviation.  Cardiovascular:     Rate and Rhythm: Normal rate and regular rhythm.     Heart sounds: Normal heart sounds.  Pulmonary:     Effort: No respiratory distress.     Breath sounds: No stridor. No wheezing.  Abdominal:     General: Bowel sounds are normal. There is no distension.     Palpations: Abdomen is soft. There is no mass.     Tenderness: There is no abdominal tenderness. There is no guarding or rebound.  Musculoskeletal:        General: No tenderness.     Cervical back: Normal range of motion and neck supple. No rigidity.  Lymphadenopathy:     Cervical: No cervical  adenopathy.  Skin:    Findings: No erythema or rash.  Neurological:     Cranial Nerves: No cranial nerve deficit.     Motor: No abnormal muscle tone.  Coordination: Coordination normal.     Deep Tendon Reflexes: Reflexes normal.  Psychiatric:        Behavior: Behavior normal.        Thought Content: Thought content normal.        Judgment: Judgment normal.   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  Assessment & Plan:   Problem List Items Addressed This Visit       Endocrine   DM type 2, controlled, with complication ([place])    Better on Insulin and w/Libre 3 use        Genitourinary   CRI (chronic renal insufficiency), stage 3 (moderate) ([facility])    Hydrate well Monitor GFR        Other   Low back pain    Stable Continue w/Tramadol prn  Potential benefits of a long term Tramadol/opioids use as well as potential risks (i.e. addiction risk, apnea etc) and complications (i.e. Somnolence, constipation and others) were explained to the patient and were aknowledged.      Knee instability, left - Primary    Walk more to strengthen muscles Sleeve brace if needed         No orders of the defined types were placed in this encounter.     Follow-up: No follow-ups on file.  Walker Kehr, MD

## 2022-11-13 ENCOUNTER — Ambulatory Visit: Payer: Self-pay

## 2022-11-13 NOTE — Patient Instructions (Addendum)
Visit Information  Thank you for taking time to visit with me today. Please don't hesitate to contact me if I can be of assistance to you.   Following are the goals we discussed today:   Goals Addressed             This Visit's Progress    care coordination activities       Interventions Today    Flowsheet Row Most Recent Value  Chronic Disease   Chronic disease during today's visit Diabetes  General Interventions   General Interventions Discussed/Reviewed Doctor Visits, General Interventions Discussed, Durable Medical Equipment (DME)  [provided RNCM contact number and encouraged to call with care coordination needs as needed]  Doctor Visits Discussed/Reviewed Doctor Visits Reviewed, Doctor Visits Discussed  [reviewed upcoming appointment. reviewed instructions per PCP office visit completed 11/07/22]  Durable Medical Equipment (DME) Other  Aretta Nip libre]  Education Interventions   Education Provided Provided Education, Provided Web-based Education  [DM, Diabetes and diet,]  Provided Verbal Education On Blood Sugar Monitoring, Medication  [rule of 15 for low blood sugar]  Pharmacy Interventions   Pharmacy Dicussed/Reviewed Pharmacy Topics Discussed, Medications and their functions, Medication Adherence, Affording Medications            Our next appointment is by telephone on 12/18/22 at 2:00 pm  Please call the care guide team at [phone] if you need to cancel or reschedule your appointment.   If you are experiencing a Mental Health or [place] or need someone to talk to, please call the Suicide and Crisis Lifeline: [place], RN, MSN, BSN, [place] [phone]     Hypoglycemia Hypoglycemia is when the sugar (glucose) level in your blood is too low. Low blood sugar can happen to people who have diabetes and people who do not have diabetes. Low blood sugar can happen quickly, and it can be an emergency. What are the causes? This  condition happens most often in people who have diabetes. It may be caused by: Diabetes medicine. Not eating enough, or not eating often enough. Doing more physical activity. Drinking alcohol on an empty stomach. If you do not have diabetes, this condition may be caused by: A tumor in the pancreas. Not eating enough, or not eating for long periods at a time (fasting). A very bad infection or illness. Problems after having weight loss (bariatric) surgery. Kidney failure or liver failure. Certain medicines. What increases the risk? This condition is more likely to develop in people who: Have diabetes and take medicines to lower their blood sugar. Abuse alcohol. Have a very bad illness. What are the signs or symptoms? Mild Hunger. Sweating and feeling clammy. Feeling dizzy or light-headed. Being sleepy or having trouble sleeping. Feeling like you may vomit (nauseous). A fast heartbeat. A headache. Blurry vision. Mood changes, such as: Being grouchy. Feeling worried or nervous (anxious). Tingling or loss of feeling (numbness) around your mouth, lips, or tongue. Moderate Confusion and poor judgment. Behavior changes. Weakness. Uneven heartbeat. Trouble with moving (coordination). Very low Very low blood sugar (severe hypoglycemia) is a medical emergency. It can cause: Fainting. Seizures. Loss of consciousness (coma). Death. How is this treated? Treating low blood sugar Low blood sugar is often treated by eating or drinking something that has sugar in it right away. The food or drink should contain 15 grams of a fast-acting carb (carbohydrate). Options include: 4 oz (120 mL) of fruit juice. 4 oz (120 mL) of regular soda (not diet soda).  A few pieces of hard candy. Check food labels to see how many pieces to eat for 15 grams. 1 Tbsp (15 mL) of sugar or honey. 4 glucose tablets. 1 tube of glucose gel. Treating low blood sugar if you have diabetes If you can think clearly  and swallow safely, follow the 15:15 rule: Take 15 grams of a fast-acting carb. Talk with your doctor about how much you should take. Always keep a source of fast-acting carb with you, such as: Glucose tablets (take 4 tablets). A few pieces of hard candy. Check food labels to see how many pieces to eat for 15 grams. 4 oz (120 mL) of fruit juice. 4 oz (120 mL) of regular soda (not diet soda). 1 Tbsp (15 mL) of honey or sugar. 1 tube of glucose gel. Check your blood sugar 15 minutes after you take the carb. If your blood sugar is still at or below 70 mg/dL (3.9 mmol/L), take 15 grams of a carb again. If your blood sugar does not go above 70 mg/dL (3.9 mmol/L) after 3 tries, get help right away. After your blood sugar goes back to normal, eat a meal or a snack within 1 hour.  Treating very low blood sugar If your blood sugar is below 54 mg/dL (3 mmol/L), you have very low blood sugar, or severe hypoglycemia. This is an emergency. Get medical help right away. If you have very low blood sugar and you cannot eat or drink, you will need to be given a hormone called glucagon. A family member or friend should learn how to check your blood sugar and how to give you glucagon. Ask your doctor if you need to have an emergency glucagon kit at home. Very low blood sugar may also need to be treated in a hospital. Follow these instructions at home: General instructions Take over-the-counter and prescription medicines only as told by your doctor. Stay aware of your blood sugar as told by your doctor. If you drink alcohol: Limit how much you have to: 0-1 drink a day for women who are not pregnant. 0-2 drinks a day for men. Know how much alcohol is in your drink. In the [place] one drink equals one 12 oz bottle of beer (355 mL), one 5 oz glass of wine (148 mL), or one 1 oz glass of hard liquor (44 mL). Be sure to eat food when you drink alcohol. Know that your body absorbs alcohol quickly. This may lead to  low blood sugar later. Be sure to keep checking your blood sugar. Keep all follow-up visits. If you have diabetes:  Always have a fast-acting carb (15 grams) with you to treat low blood sugar. Follow your diabetes care plan as told by your doctor. Make sure you: Know the symptoms of low blood sugar. Check your blood sugar as often as told. Always check it before and after exercise. Always check your blood sugar before you drive. Take your medicines as told. Follow your meal plan. Eat on time. Do not skip meals. Share your diabetes care plan with: Your work or school. People you live with. Carry a card or wear jewelry that says you have diabetes. Where to find more information American Diabetes Association: www.diabetes.org Contact a doctor if: You have trouble keeping your blood sugar in your target range. You have low blood sugar often. Get help right away if: You still have symptoms after you eat or drink something that contains 15 grams of fast-acting carb, and you cannot get your  blood sugar above 70 mg/dL by following the 15:15 rule. Your blood sugar is below 54 mg/dL (3 mmol/L). You have a seizure. You faint. These symptoms may be an emergency. Get help right away. Call your local emergency services (911 in the [place] Do not wait to see if the symptoms will go away. Do not drive yourself to the hospital. Summary Hypoglycemia happens when the level of sugar (glucose) in your blood is too low. Low blood sugar can happen to people who have diabetes and people who do not have diabetes. Low blood sugar can happen quickly, and it can be an emergency. Make sure you know the symptoms of low blood sugar and know how to treat it. Always keep a source of sugar (fast-acting carb) with you to treat low blood sugar. This information is not intended to replace advice given to you by your health care provider. Make sure you discuss any questions you have with your health care  provider. Document Revised: 07/13/2020 Document Reviewed: 07/13/2020 Elsevier Patient Education  [place].

## 2022-11-13 NOTE — Patient Outreach (Signed)
  Care Coordination   Initial Visit Note   11/13/2022 Name: [name] MRN: [id number] DOB: 05-23-1941  [name] is a 82 y.o. year old female who sees Plotnikov, Evie Lacks, MD for primary care. I spoke with  [name] by phone today.  What matters to the patients health and wellness today?  [name] reports office visit with primary care on 11/07/22. Upcoming visit with endocrinolgoist on 11/18/22. BS this am 112. She reports alrm went off at 6:20 when blood sugar was 69. She staets she treated with 2 gluocose tablets. She did not recheck. Denies any complaints at this time.  Goals Addressed             This Visit's Progress    care coordination activities       Interventions Today    Flowsheet Row Most Recent Value  Chronic Disease   Chronic disease during today's visit Diabetes  General Interventions   General Interventions Discussed/Reviewed Doctor Visits, General Interventions Discussed, Durable Medical Equipment (DME)  [provided RNCM contact number and encouraged to call with care coordination needs as needed]  Doctor Visits Discussed/Reviewed Doctor Visits Reviewed, Doctor Visits Discussed  [reviewed upcoming appointment. reviewed instructions per PCP office visit completed 11/07/22]  Durable Medical Equipment (DME) Other  [name] libre]  Education Interventions   Education Provided Provided Education, Provided Web-based Education  [DM, Diabetes and diet,]  Provided Verbal Education On Blood Sugar Monitoring, Medication  [rule of 15 for low blood sugar]  Pharmacy Interventions   Pharmacy Dicussed/Reviewed Pharmacy Topics Discussed, Medications and their functions, Medication Adherence, Affording Medications            SDOH assessments and interventions completed:  Yes  SDOH Interventions Today    Flowsheet Row Most Recent Value  SDOH Interventions   Housing Interventions Intervention Not Indicated  Utilities Interventions Intervention Not Indicated      Care Coordination Interventions:  Yes, provided   Follow up plan: Follow up call scheduled for 12/18/22    Encounter Outcome:  Pt. Visit Completed   Cheryl Silversmith, RN, MSN, BSN, [place] Coordinator [phone]

## 2022-11-18 ENCOUNTER — Ambulatory Visit: Payer: PPO | Admitting: Endocrinology

## 2022-11-18 VITALS — BP 132/76 | Ht 62.5 in | Wt 118.0 lb

## 2022-11-18 DIAGNOSIS — R634 Abnormal weight loss: Secondary | ICD-10-CM

## 2022-11-18 DIAGNOSIS — E1165 Type 2 diabetes mellitus with hyperglycemia: Secondary | ICD-10-CM | POA: Diagnosis not present
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
## 2022-11-18 NOTE — Patient Instructions (Addendum)
Stop Repeglanide at Bfst.and supper  Humalog 4 units in am and 3 at supper (4 for larger meal or sweets)   Check fingerstick if sugar reads <70

## 2022-11-21 ENCOUNTER — Ambulatory Visit: Payer: PPO | Admitting: Adult Health

## 2022-11-21 ENCOUNTER — Telehealth: Payer: Self-pay | Admitting: Adult Health

## 2022-11-21 ENCOUNTER — Encounter: Payer: Self-pay | Admitting: Endocrinology

## 2022-11-21 DIAGNOSIS — G4733 Obstructive sleep apnea (adult) (pediatric): Secondary | ICD-10-CM | POA: Diagnosis not present

## 2022-11-21 NOTE — Telephone Encounter (Signed)
Pt called. Stated she needed to reschedule appointment for today with [facility] for Botox. Stated she is dizzy and can't make this appointment today. Pt was rescheduled for 6/25 with [facility] for Botox. Pt is asking for a sooner appointment for Botox, I added pt to the waitlist for a sooner appointment.

## 2022-11-21 NOTE — Telephone Encounter (Signed)
Noted  

## 2022-11-25 ENCOUNTER — Telehealth: Payer: Self-pay

## 2022-11-25 NOTE — Telephone Encounter (Signed)
Inbound fax from DME supplier requesting form be completed and faxed with clinical notes. DME supplies ordered via [employer] through online portal.  [facility]: Libre 3 CGM

## 2022-11-29 NOTE — Telephone Encounter (Signed)
Inbound fax from DME supplier requesting form be completed and faxed with clinical notes. DME supplies ordered via [employer] through online portal. [employer] not covered by pt insurance. New DME supplier. [employer]

## 2022-12-02 ENCOUNTER — Ambulatory Visit: Payer: PPO | Admitting: Adult Health

## 2022-12-02 DIAGNOSIS — G43709 Chronic migraine without aura, not intractable, without status migrainosus: Secondary | ICD-10-CM

## 2022-12-02 MED ORDER — NURTEC 75 MG PO TBDP: 75.0000 mg | ORAL_TABLET | ORAL | 0 refills | Status: AC | PRN

## 2022-12-02 MED ORDER — ONABOTULINUMTOXINA 200 UNITS IJ SOLR
155.0000 [IU] | Freq: Once | INTRAMUSCULAR | Status: AC
  Administered 2022-12-02: 155 [IU] via INTRAMUSCULAR

## 2022-12-02 NOTE — Progress Notes (Signed)
12/02/22: Headaches are a little better since she started insulin. Not using CPAP more than 4 hours each night. CPAP report attached. Imitrex helps but she does not want to take it that often. Ubrelvy wasn't helpful. Has not tried Nurtec.   08/27/22: Late for her botox so she has had some break through headaches   04/30/22: stable   BOTOX PROCEDURE NOTE FOR MIGRAINE HEADACHE    Contraindications and precautions discussed with patient(above). Aseptic procedure was observed and patient tolerated procedure. Procedure performed by Butch Penny, NP  The condition has existed for more than 6 months, and pt does not have a diagnosis of ALS, Myasthenia Gravis or Lambert-Eaton Syndrome.  Risks and benefits of injections discussed and pt agrees to proceed with the procedure.  Written consent obtained   Indication/Diagnosis: chronic migraine BOTOX(J0585) injection was performed according to protocol by [employer]. 200 units of BOTOX was dissolved into 4 cc NS.   NDC: [address]-10     Botox- 200 units x 1 vial Lot: [id number] Expiration: 01/2025 NDC: 8887-5797-28   Bacteriostatic 0.9% Sodium Chloride- 4 mL total Lot: [id number] Expiration: 06/2024 NDC: 15379-432-76   Dx: D47 .709    Description of procedure:  The patient was placed in a sitting position. The standard protocol was used for Botox as follows, with 5 units of Botox injected at each site:   -Procerus muscle, midline injection  -Corrugator muscle, bilateral injection  -Frontalis muscle, bilateral injection, with 2 sites each side, medial injection was performed in the upper one third of the frontalis muscle, in the region vertical from the medial inferior edge of the superior orbital rim. The lateral injection was again in the upper one third of the forehead vertically above the lateral limbus of the cornea, 1.5 cm lateral to the medial injection site.  -Temporalis muscle injection, 4 sites, bilaterally. The first injection was  3 cm above the tragus of the ear, second injection site was 1.5 cm to 3 cm up from the first injection site in line with the tragus of the ear. The third injection site was 1.5-3 cm forward between the first 2 injection sites. The fourth injection site was 1.5 cm posterior to the second injection site.  -Occipitalis muscle injection, 3 sites, bilaterally. The first injection was done one half way between the occipital protuberance and the tip of the mastoid process behind the ear. The second injection site was done lateral and superior to the first, 1 fingerbreadth from the first injection. The third injection site was 1 fingerbreadth superiorly and medially from the first injection site.  -Cervical paraspinal muscle injection, 2 sites, bilateral knee first injection site was 1 cm from the midline of the cervical spine, 3 cm inferior to the lower border of the occipital protuberance. The second injection site was 1.5 cm superiorly and laterally to the first injection site.  -Trapezius muscle injection was performed at 3 sites, bilaterally. The first injection site was in the upper trapezius muscle halfway between the inflection point of the neck, and the acromion. The second injection site was one half way between the acromion and the first injection site. The third injection was done between the first injection site and the inflection point of the neck.   Will return for repeat injection in 3 months.   A 200 units of Botox was used, 155 units were injected, the rest of the Botox was wasted. The patient tolerated the procedure well, there were no complications of the above procedure.  Butch Penny,  MSN, NP-C 12/02/2022, 9:46 AM [facility] [address] [place], [place] [address] [phone]

## 2022-12-02 NOTE — Progress Notes (Signed)
Botox- 200 units x 1 vial Lot: [id number] Expiration: 01/2025 NDC: 5974-1638-45  Bacteriostatic 0.9% Sodium Chloride- 4 mL total Lot: [id number] Expiration: 06/2024 NDC: 21224-825-00  Dx: G43 .709  B/B Witnessed by Delmer Islam

## 2022-12-03 ENCOUNTER — Other Ambulatory Visit: Payer: Self-pay | Admitting: Neurology

## 2022-12-03 ENCOUNTER — Telehealth: Payer: Self-pay | Admitting: Nutrition

## 2022-12-03 ENCOUNTER — Ambulatory Visit: Payer: PPO | Admitting: Nutrition

## 2022-12-03 NOTE — Telephone Encounter (Signed)
Patient reports that she is very anxious today and not able to keep her appointment with me. 1.Patient reports low blood sugars of 77, and 71 at 10PM this past week, and usually happening 1-2  X/wk.  She takes 3u of Humalog acS.  She had 2 pieces of pizza last night.   2. Also says this insulin regimen is making her very anxious and she was wondering if she can be on something simpler, and less stressful for her.     She is wanting Dr. [name] to know that she has received a message from [facility] that she gets no supplies from them.  She gets her Libres from [facility] on [address].  Not sure what you have ordered for her.

## 2022-12-04 NOTE — Telephone Encounter (Signed)
LDTM 

## 2022-12-05 NOTE — Telephone Encounter (Signed)
LDTM

## 2022-12-05 NOTE — Telephone Encounter (Signed)
Patient is aware 

## 2022-12-09 ENCOUNTER — Other Ambulatory Visit: Payer: PPO

## 2022-12-13 ENCOUNTER — Ambulatory Visit: Payer: PPO | Admitting: Endocrinology

## 2022-12-17 ENCOUNTER — Ambulatory Visit
Admission: RE | Admit: 2022-12-17 | Discharge: 2022-12-17 | Disposition: A | Discharge status: Home / Self Care | Payer: PPO | Source: Ambulatory Visit | Attending: Nurse Practitioner | Admitting: Nurse Practitioner

## 2022-12-17 DIAGNOSIS — Z1231 Encounter for screening mammogram for malignant neoplasm of breast: Secondary | ICD-10-CM | POA: Diagnosis not present

## 2022-12-18 ENCOUNTER — Telehealth: Payer: Self-pay

## 2022-12-18 NOTE — Patient Outreach (Signed)
  Care Coordination   12/18/2022 Name: [name] MRN: [id number] DOB: 1941/04/09   Care Coordination Outreach Attempts:  An unsuccessful telephone outreach was attempted today to offer the patient information about available care coordination services as a benefit of their health plan.   Follow Up Plan:  Additional outreach attempts will be made to offer the patient care coordination information and services.   Encounter Outcome:  No Answer   Care Coordination Interventions:  No, not indicated    Kathyrn Sheriff, RN, MSN, BSN, CCM [facility] Care Coordinator [phone]

## 2022-12-24 ENCOUNTER — Telehealth: Payer: Self-pay | Admitting: *Deleted

## 2022-12-24 NOTE — Progress Notes (Unsigned)
  Care Coordination Note  12/24/2022 Name: [name] MRN: [id number] DOB: 05-05-1941  [name] is a 82 y.o. year old female who is a primary care patient of Plotnikov, Georgina Quint, MD and is actively engaged with the care management team. I reached out to [name] by phone today to assist with re-scheduling a follow up visit with the RN Case Manager  Follow up plan: Unsuccessful telephone outreach attempt made. A HIPAA compliant phone message was left for the patient providing contact information and requesting a return call.   Burman Nieves, CCMA Care Coordination Care Guide Direct Dial: [phone]

## 2022-12-26 NOTE — Progress Notes (Signed)
  Care Coordination Note  12/26/2022 Name: [name] MRN: [id number] DOB: [birth year]  [name] is a 82 y.o. year old female who is a primary care patient of Plotnikov, Georgina Quint, MD and is actively engaged with the care management team. I reached out to [name] by phone today to assist with re-scheduling a follow up visit with the RN Case Manager  Follow up plan: Telephone appointment with care management team member scheduled for:01/07/2023  Burman Nieves, [facility] Care Coordination Care Guide Direct Dial: [phone]

## 2022-12-27 ENCOUNTER — Other Ambulatory Visit: Payer: Self-pay | Admitting: Endocrinology

## 2023-01-03 DIAGNOSIS — G4733 Obstructive sleep apnea (adult) (pediatric): Secondary | ICD-10-CM | POA: Diagnosis not present

## 2023-01-07 ENCOUNTER — Ambulatory Visit: Payer: Self-pay

## 2023-01-07 DIAGNOSIS — G4733 Obstructive sleep apnea (adult) (pediatric): Secondary | ICD-10-CM | POA: Diagnosis not present

## 2023-01-07 NOTE — Patient Instructions (Signed)
Visit Information  Thank you for taking time to visit with me today. Please don't hesitate to contact me if I can be of assistance to you.   Following are the goals we discussed today:   Goals Addressed             This Visit's Progress    care coordination activities       Interventions Today    Flowsheet Row Most Recent Value  Chronic Disease   Chronic disease during today's visit Diabetes  General Interventions   General Interventions Discussed/Reviewed General Interventions Reviewed, Doctor Visits  Doctor Visits Discussed/Reviewed Doctor Visits Reviewed, Specialist  [reiterated upcoming appointment with endocrinologist schedule 01/22/22 with labs on 01/16/23]  Education Interventions   Education Provided Provided Web-based Education, Provided Education  [resent emmi video Diabetes type 2,  dialy care for type 2 diabetes overview,  diabetes: nutrition and healthy eating, low blood sugar in patients with diabetes]  Provided Verbal Education On --  [reviewed upcoming appointments, encouraged to continue to take medications as prescribed by provider. Reinforced diabetes/nutrition educator an excellent resource to assist in managing diabetes. RNCM encouraged to follow up.]  Pharmacy Interventions   Pharmacy Dicussed/Reviewed Pharmacy Topics Reviewed            Our next appointment is by telephone on 02/06/23 at 2:30 pm  Please call the care guide team at [phone] if you need to cancel or reschedule your appointment.   If you are experiencing a Mental Health or Behavioral Health Crisis or need someone to talk to, please call the Suicide and Crisis Lifeline: 76  Kathyrn Sheriff, RN, MSN, BSN, CCM [facility] Care Coordinator [phone]

## 2023-01-07 NOTE — Patient Outreach (Signed)
  Care Coordination   Follow Up Visit Note   01/07/2023 Name: [name] MRN: [id number] DOB: 07-16-1941  [name] is a 82 y.o. year old female who sees Owen, Cheryl Quint, MD for primary care. I spoke with  [name] by phone today.  What matters to the patients health and wellness today?  [name] reports she is taking her medications as prescribed by provider. She reports a few low blood sugar  readings. She reports her Dexcom alarmed around 5:15 am this morning at 69. She states she took two glucose tablets and states checked blood sugar around 9am it was 100. She reports after thinking about it she had a light dinner: grilled cheese and pasta salad. But she reports overall her blood sugars have been doing fine. Upcoming appointment with [name] scheduled for 01/23/23.  Goals Addressed             This Visit's Progress    care coordination activities       Interventions Today    Flowsheet Row Most Recent Value  Chronic Disease   Chronic disease during today's visit Diabetes  General Interventions   General Interventions Discussed/Reviewed General Interventions Reviewed, Doctor Visits  Doctor Visits Discussed/Reviewed Doctor Visits Reviewed, Specialist  [reiterated upcoming appointment with endocrinologist schedule 01/22/22 with labs on 01/16/23]  Education Interventions   Education Provided Provided Web-based Education, Provided Education  [resent emmi video Diabetes type 2,  dialy care for type 2 diabetes overview,  diabetes: nutrition and healthy eating, low blood sugar in patients with diabetes]  Provided Verbal Education On --  [reviewed upcoming appointments, encouraged to continue to take medications as prescribed by provider. Reinforced diabetes/nutrition educator an excellent resource to assist in managing diabetes. RNCM encouraged to follow up.]  Pharmacy Interventions   Pharmacy Dicussed/Reviewed Pharmacy Topics Reviewed            SDOH assessments  and interventions completed:  No  Care Coordination Interventions:  Yes, provided   Follow up plan: Follow up call scheduled for 02/06/23    Encounter Outcome:  Pt. Visit Completed   Cheryl Sheriff, RN, MSN, BSN, CCM [facility] Care Coordinator [phone]

## 2023-01-13 ENCOUNTER — Telehealth: Payer: Self-pay | Admitting: Adult Health

## 2023-01-13 NOTE — Telephone Encounter (Addendum)
LVM for patient to call back to discuss Migraine medication  management

## 2023-01-13 NOTE — Telephone Encounter (Signed)
Pt stated she had a migraine all last week, stated the Botox just didn't work this time. Stated the medication she is on isn't working and wants to know if Aundra Millet can prescript something else until the Botox, starts working again.

## 2023-01-14 NOTE — Telephone Encounter (Signed)
Spoke to patient  Pt states feels much better today . Pt had  botox 12/02/2022  had migrane last week pt took ubrevly 2 tablets  next day took1 more tablet Pt hasnt taken  anything this week  . pt states doesnt like to  take sumtriptan due to rebound headaches. Offered patient Toradol injection pt refused states does feel better this week Pt states if she feels worse she will call back Informed patient if she has increased migraines call us back or go to urgent care to be evaluated  Pt expressed understanding and thanked me for calling Will forward to Megan,NP to make her aware

## 2023-01-16 ENCOUNTER — Other Ambulatory Visit: Payer: PPO

## 2023-01-16 DIAGNOSIS — F332 Major depressive disorder, recurrent severe without psychotic features: Secondary | ICD-10-CM | POA: Diagnosis not present

## 2023-01-17 ENCOUNTER — Other Ambulatory Visit (INDEPENDENT_AMBULATORY_CARE_PROVIDER_SITE_OTHER): Payer: PPO

## 2023-01-17 DIAGNOSIS — E1165 Type 2 diabetes mellitus with hyperglycemia: Secondary | ICD-10-CM

## 2023-01-17 LAB — BASIC METABOLIC PANEL
BUN: 16 mg/dL (ref 6–23)
CO2: 31 mEq/L (ref 19–32)
Calcium: 10.1 mg/dL (ref 8.4–10.5)
Chloride: 99 mEq/L (ref 96–112)
Creatinine, Ser: 1.11 mg/dL (ref 0.40–1.20)
GFR: 46.65 mL/min — ABNORMAL LOW (ref >60.00)
Glucose, Bld: 138 mg/dL — ABNORMAL HIGH (ref 70–99)
Potassium: 4.9 mEq/L (ref 3.5–5.1)
Sodium: 137 mEq/L (ref 135–145)

## 2023-01-17 LAB — HEMOGLOBIN A1C: Hgb A1c MFr Bld: 7 % — ABNORMAL HIGH (ref 4.6–6.5)

## 2023-01-23 ENCOUNTER — Ambulatory Visit: Payer: PPO | Admitting: Endocrinology

## 2023-02-05 ENCOUNTER — Encounter: Payer: Self-pay | Admitting: Internal Medicine

## 2023-02-05 ENCOUNTER — Ambulatory Visit (INDEPENDENT_AMBULATORY_CARE_PROVIDER_SITE_OTHER): Payer: PPO | Admitting: Internal Medicine

## 2023-02-05 VITALS — BP 118/68 | HR 93 | Temp 98.2°F | Ht 62.5 in | Wt 119.0 lb

## 2023-02-05 DIAGNOSIS — F411 Generalized anxiety disorder: Secondary | ICD-10-CM

## 2023-02-05 DIAGNOSIS — N183 Chronic kidney disease, stage 3 unspecified: Secondary | ICD-10-CM

## 2023-02-05 DIAGNOSIS — Z794 Long term (current) use of insulin: Secondary | ICD-10-CM | POA: Diagnosis not present

## 2023-02-05 DIAGNOSIS — F3341 Major depressive disorder, recurrent, in partial remission: Secondary | ICD-10-CM | POA: Diagnosis not present

## 2023-02-05 DIAGNOSIS — M810 Age-related osteoporosis without current pathological fracture: Secondary | ICD-10-CM | POA: Diagnosis not present

## 2023-02-05 DIAGNOSIS — E118 Type 2 diabetes mellitus with unspecified complications: Secondary | ICD-10-CM | POA: Diagnosis not present

## 2023-02-05 DIAGNOSIS — R413 Other amnesia: Secondary | ICD-10-CM | POA: Diagnosis not present

## 2023-02-05 DIAGNOSIS — I10 Essential (primary) hypertension: Secondary | ICD-10-CM

## 2023-02-05 DIAGNOSIS — N2889 Other specified disorders of kidney and ureter: Secondary | ICD-10-CM

## 2023-02-05 NOTE — Assessment & Plan Note (Signed)
Monitor GFR 

## 2023-02-05 NOTE — Progress Notes (Signed)
Subjective:  Patient ID: [name], female    DOB: 1940/12/29  Age: 82 y.o. MRN: [id number]  CC: Follow-up (3 MNTH F/U, FATIGUE, TROUBLE FALLING ASLEEP, INCREASED ANXIETY AND CONSTIPATION)   HPI PHUNG KOTAS presents for anxiety, insomnia, LBP, depression  Outpatient Medications Prior to Visit  Medication Sig Dispense Refill   acetaminophen (TYLENOL) 325 MG tablet Take 325 mg by mouth 2 (two) times daily as needed. Takes with tramadol     aspirin EC 81 MG tablet Take 1 tablet (81 mg total) by mouth daily. 100 tablet 3   BD PEN NEEDLE NANO U/F 32G X 4 MM MISC USE AS DIRECTED TWICE DAILY. 200 each 3   busPIRone (BUSPAR) 15 MG tablet Take 1 tablet by mouth 2 (two) times daily.     cetirizine (ZYRTEC) 10 MG tablet Take 10 mg by mouth daily as needed for allergies.     Continuous Glucose Sensor (FREESTYLE LIBRE 3 SENSOR) MISC 1 DEVICE BY DOES NOT APPLY ROUTE EVERY 14 (FOURTEEN) DAYS. APPLY 1 SENSOR ON UPPER ARM EVERY 14 DAYS FOR CONTINUOUS GLUCOSE MONITORING 2 each 2   famotidine (PEPCID) 40 MG tablet Take 1 tablet (40 mg total) by mouth at bedtime. 90 tablet 3   insulin lispro (HUMALOG KWIKPEN) 100 UNIT/ML KwikPen 8 units ac tid 15 mL 1   L-Methylfolate (DEPLIN) 15 MG TABS Take 15 mg by mouth daily. For treatment of depression     Liniments (SALONPAS PAIN RELIEF PATCH EX) Apply 1 patch topically at bedtime.     liraglutide (VICTOZA) 18 MG/3ML SOPN Inject 0.6 mg into the skin daily. 9 mL 1   LORazepam (ATIVAN) 1 MG tablet Take 1 mg by mouth 4 (four) times daily as needed for anxiety. Reports to taking 1/2 tablet at breakfast and lunch and 1 tablet at bedtime  2   Magnesium Hydroxide (MILK OF MAGNESIA PO) Take 1-2 capsules by mouth daily as needed.     ondansetron (ZOFRAN) 4 MG tablet Take 1 tablet (4 mg total) by mouth every 8 (eight) hours as needed for nausea or vomiting. 20 tablet 0   ONETOUCH VERIO test strip Use as instructed to check blood sugar once a day 100 each 3    pantoprazole (PROTONIX) 40 MG tablet Take 1 tablet (40 mg total) by mouth 2 (two) times daily. 180 tablet 3   pioglitazone (ACTOS) 30 MG tablet TAKE 1 TABLET BY MOUTH EVERY DAY 90 tablet 1   repaglinide (PRANDIN) 1 MG tablet TAKE 2 TABLETS BY MOUTH BEFORE BREAKFAST, 1 TABLET AT LUNCH AND 1 TABLET AT DINNER (Patient taking differently: TAKE 1 tablet at lunch) 360 tablet 1   Rimegepant Sulfate (NURTEC) 75 MG TBDP Take 1 tablet (75 mg total) by mouth as needed. 4 tablet 0   SUMAtriptan (IMITREX) 100 MG tablet Take 1 tablet (100 mg total) by mouth every 2 (two) hours as needed. Max twice a day. 9 tablet 11   traMADol (ULTRAM) 50 MG tablet TAKE 1 TABLET (50 MG TOTAL) BY MOUTH EVERY 12 (TWELVE) HOURS AS NEEDED FOR SEVERE PAIN. 180 tablet 1   TRINTELLIX 20 MG TABS Take 10 mg by mouth daily. Take 10 mg daily  2   Ubrogepant (UBRELVY) 100 MG TABS Take 1 tablet at the onset of a migraine can repeat in 2 hours if needed 15 tablet 11   VOLTAREN 1 % GEL APPLY 2 GRAMS TO KNEE 2-3 TIMES DAILY PRN  1   dicyclomine (BENTYL) 10 MG capsule  Take 1 capsule (10 mg total) by mouth 3 (three) times daily before meals. (Patient not taking: Reported on 12/02/2022) 270 capsule 3   No facility-administered medications prior to visit.    ROS: Review of Systems  Constitutional:  Positive for fatigue. Negative for activity change, appetite change, chills and unexpected weight change.  HENT:  Negative for congestion, mouth sores and sinus pressure.   Eyes:  Negative for visual disturbance.  Respiratory:  Negative for cough and chest tightness.   Gastrointestinal:  Negative for abdominal pain and nausea.  Genitourinary:  Negative for difficulty urinating, frequency and vaginal pain.  Musculoskeletal:  Positive for arthralgias and back pain. Negative for gait problem.  Skin:  Negative for pallor and rash.  Neurological:  Positive for weakness. Negative for dizziness, tremors, numbness and headaches.  Psychiatric/Behavioral:   Positive for agitation, decreased concentration, dysphoric mood and sleep disturbance. Negative for confusion and suicidal ideas. The patient is nervous/anxious.     Objective:  BP 118/68 (BP Location: Left Arm, Patient Position: Sitting, Cuff Size: Large)   Pulse 93   Temp 98.2 F (36.8 C) (Oral)   Ht 5' 2.5" (1.588 m)   Wt 119 lb (54 kg)   SpO2 98%   BMI 21.42 kg/m   BP Readings from Last 3 Encounters:  02/05/23 118/68  11/18/22 132/76  11/07/22 (!) 140/80    Wt Readings from Last 3 Encounters:  02/05/23 119 lb (54 kg)  11/18/22 118 lb (53.5 kg)  11/07/22 119 lb (54 kg)    Physical Exam Constitutional:      General: She is not in acute distress.    Appearance: Normal appearance. She is well-developed.  HENT:     Head: Normocephalic.     Right Ear: External ear normal.     Left Ear: External ear normal.     Nose: Nose normal.  Eyes:     General:        Right eye: No discharge.        Left eye: No discharge.     Conjunctiva/sclera: Conjunctivae normal.     Pupils: Pupils are equal, round, and reactive to light.  Neck:     Thyroid: No thyromegaly.     Vascular: No JVD.     Trachea: No tracheal deviation.  Cardiovascular:     Rate and Rhythm: Normal rate and regular rhythm.     Heart sounds: Normal heart sounds.  Pulmonary:     Effort: No respiratory distress.     Breath sounds: No stridor. No wheezing.  Abdominal:     General: Bowel sounds are normal. There is no distension.     Palpations: Abdomen is soft. There is no mass.     Tenderness: There is no abdominal tenderness. There is no guarding or rebound.  Musculoskeletal:        General: Tenderness present.     Cervical back: Normal range of motion and neck supple. No rigidity.  Lymphadenopathy:     Cervical: No cervical adenopathy.  Skin:    Findings: No erythema or rash.  Neurological:     Cranial Nerves: No cranial nerve deficit.     Motor: No abnormal muscle tone.     Coordination: Coordination  abnormal.     Gait: Gait abnormal.     Deep Tendon Reflexes: Reflexes normal.  Psychiatric:        Behavior: Behavior normal.        Thought Content: Thought content normal.  Judgment: Judgment normal.     Lab Results  Component Value Date   WBC 6.9 06/21/2021   HGB 11.6 (L) 06/21/2021   HCT 35.6 (L) 06/21/2021   PLT 400.0 06/21/2021   GLUCOSE 138 (H) 01/17/2023   CHOL 116 05/10/2020   TRIG 53.0 05/10/2020   HDL 71.30 05/10/2020   LDLDIRECT 48.0 06/18/2022   LDLCALC 34 05/10/2020   ALT 24 06/18/2022   AST 31 06/18/2022   NA 137 01/17/2023   K 4.9 01/17/2023   CL 99 01/17/2023   CREATININE 1.11 01/17/2023   BUN 16 01/17/2023   CO2 31 01/17/2023   TSH 0.89 12/10/2021   INR 0.93 11/02/2013   HGBA1C 7.0 (H) 01/17/2023   MICROALBUR 1.0 06/18/2022    MM 3D SCREEN BREAST BILATERAL  Result Date: 12/19/2022 CLINICAL DATA:  Screening. EXAM: DIGITAL SCREENING BILATERAL MAMMOGRAM WITH TOMOSYNTHESIS AND CAD TECHNIQUE: Bilateral screening digital craniocaudal and mediolateral oblique mammograms were obtained. Bilateral screening digital breast tomosynthesis was performed. The images were evaluated with computer-aided detection. COMPARISON:  Previous exam(s). ACR Breast Density Category c: The breasts are heterogeneously dense, which may obscure small masses. FINDINGS: There are no findings suspicious for malignancy. IMPRESSION: No mammographic evidence of malignancy. A result letter of this screening mammogram will be mailed directly to the patient. RECOMMENDATION: Screening mammogram in one year. (Code:SM-B-01Y) BI-RADS CATEGORY  1: Negative. Electronically Signed   By: Frederico Hamman M.D.   On: 12/19/2022 11:01    Assessment & Plan:   Problem List Items Addressed This Visit     DM type 2, controlled, with complication (HCC) - Primary    Better on Insulin and w/Libre 3 use      Anxiety state    Chronic [name] On prn Xanax  Potential benefits of a long term  benzodiazepines  use as well as potential risks  and complications were explained to the patient and were aknowledged.      Depression    The patient was encouraged to start doing small chores around the house with her husband. We will reduce the dosage of her meds that potentially can make her tired, cause nausea. Hedaya will ask Dr.Kaur about [name] for depression. Follow-up with [name].  They were discussing the option of ECT treatments.  Feeling much better w/lesser dose of Victoza and off Metformin, Remeron.      Osteoporosis    Vit D Chair yoga      Memory problem    Cont on Lion's Mane Mushroom extract or capsules for memory Focus: feeling much better w/lesser dose of Victoza and off Metformin, Remeron.       CRI (chronic renal insufficiency), stage 3 (moderate) (HCC)    Monitor GFR      HTN (hypertension)     Check BP at home         No orders of the defined types were placed in this encounter.     Follow-up: No follow-ups on file.  Sonda Primes, MD

## 2023-02-05 NOTE — Assessment & Plan Note (Signed)
Chronic Dr [name] On prn Xanax  Potential benefits of a long term benzodiazepines  use as well as potential risks  and complications were explained to the patient and were aknowledged.

## 2023-02-05 NOTE — Assessment & Plan Note (Signed)
Vit D Chair yoga

## 2023-02-05 NOTE — Addendum Note (Signed)
Addended by: Tresa Garter on: 02/05/2023 03:00 PM   Modules accepted: Level of Service

## 2023-02-05 NOTE — Assessment & Plan Note (Signed)
Better on Insulin and w/Libre 3 use 

## 2023-02-05 NOTE — Assessment & Plan Note (Signed)
The patient was encouraged to start doing small chores around the house with her husband. We will reduce the dosage of her meds that potentially can make her tired, cause nausea. [name] will ask Dr.Kaur about [name] for depression. Follow-up with [name].  They were discussing the option of ECT treatments.  Feeling much better w/lesser dose of Victoza and off Metformin, Remeron.

## 2023-02-05 NOTE — Assessment & Plan Note (Signed)
Check BP at home

## 2023-02-05 NOTE — Progress Notes (Signed)
Subjective:  Patient ID: [name], female    DOB: Apr 13, 1941  Age: 82 y.o. MRN: [id number]  CC: Follow-up (3 MNTH F/U, FATIGUE, TROUBLE FALLING ASLEEP, INCREASED ANXIETY AND CONSTIPATION)   HPI [name] presents for fatigue, stress, weakness, poor sleep  Outpatient Medications Prior to Visit  Medication Sig Dispense Refill   acetaminophen (TYLENOL) 325 MG tablet Take 325 mg by mouth 2 (two) times daily as needed. Takes with tramadol     aspirin EC 81 MG tablet Take 1 tablet (81 mg total) by mouth daily. 100 tablet 3   BD PEN NEEDLE NANO U/F 32G X 4 MM MISC USE AS DIRECTED TWICE DAILY. 200 each 3   busPIRone (BUSPAR) 15 MG tablet Take 1 tablet by mouth 2 (two) times daily.     cetirizine (ZYRTEC) 10 MG tablet Take 10 mg by mouth daily as needed for allergies.     Continuous Glucose Sensor (FREESTYLE LIBRE 3 SENSOR) MISC 1 DEVICE BY DOES NOT APPLY ROUTE EVERY 14 (FOURTEEN) DAYS. APPLY 1 SENSOR ON UPPER ARM EVERY 14 DAYS FOR CONTINUOUS GLUCOSE MONITORING 2 each 2   famotidine (PEPCID) 40 MG tablet Take 1 tablet (40 mg total) by mouth at bedtime. 90 tablet 3   insulin lispro (HUMALOG KWIKPEN) 100 UNIT/ML KwikPen 8 units ac tid 15 mL 1   L-Methylfolate (DEPLIN) 15 MG TABS Take 15 mg by mouth daily. For treatment of depression     Liniments (SALONPAS PAIN RELIEF PATCH EX) Apply 1 patch topically at bedtime.     liraglutide (VICTOZA) 18 MG/3ML SOPN Inject 0.6 mg into the skin daily. 9 mL 1   LORazepam (ATIVAN) 1 MG tablet Take 1 mg by mouth 4 (four) times daily as needed for anxiety. Reports to taking 1/2 tablet at breakfast and lunch and 1 tablet at bedtime  2   Magnesium Hydroxide (MILK OF MAGNESIA PO) Take 1-2 capsules by mouth daily as needed.     ondansetron (ZOFRAN) 4 MG tablet Take 1 tablet (4 mg total) by mouth every 8 (eight) hours as needed for nausea or vomiting. 20 tablet 0   ONETOUCH VERIO test strip Use as instructed to check blood sugar once a day 100 each 3    pantoprazole (PROTONIX) 40 MG tablet Take 1 tablet (40 mg total) by mouth 2 (two) times daily. 180 tablet 3   pioglitazone (ACTOS) 30 MG tablet TAKE 1 TABLET BY MOUTH EVERY DAY 90 tablet 1   repaglinide (PRANDIN) 1 MG tablet TAKE 2 TABLETS BY MOUTH BEFORE BREAKFAST, 1 TABLET AT LUNCH AND 1 TABLET AT DINNER (Patient taking differently: TAKE 1 tablet at lunch) 360 tablet 1   Rimegepant Sulfate (NURTEC) 75 MG TBDP Take 1 tablet (75 mg total) by mouth as needed. 4 tablet 0   SUMAtriptan (IMITREX) 100 MG tablet Take 1 tablet (100 mg total) by mouth every 2 (two) hours as needed. Max twice a day. 9 tablet 11   traMADol (ULTRAM) 50 MG tablet TAKE 1 TABLET (50 MG TOTAL) BY MOUTH EVERY 12 (TWELVE) HOURS AS NEEDED FOR SEVERE PAIN. 180 tablet 1   TRINTELLIX 20 MG TABS Take 10 mg by mouth daily. Take 10 mg daily  2   Ubrogepant (UBRELVY) 100 MG TABS Take 1 tablet at the onset of a migraine can repeat in 2 hours if needed 15 tablet 11   VOLTAREN 1 % GEL APPLY 2 GRAMS TO KNEE 2-3 TIMES DAILY PRN  1   dicyclomine (BENTYL) 10 MG  capsule Take 1 capsule (10 mg total) by mouth 3 (three) times daily before meals. (Patient not taking: Reported on 12/02/2022) 270 capsule 3   No facility-administered medications prior to visit.    ROS: Review of Systems  Constitutional:  Positive for fatigue. Negative for activity change, appetite change, chills and unexpected weight change.  HENT:  Negative for congestion, mouth sores and sinus pressure.   Eyes:  Negative for visual disturbance.  Respiratory:  Negative for cough and chest tightness.   Cardiovascular:  Positive for palpitations.  Gastrointestinal:  Positive for abdominal pain, constipation and nausea.  Genitourinary:  Negative for difficulty urinating, frequency and vaginal pain.  Musculoskeletal:  Positive for arthralgias, back pain and gait problem.  Skin:  Negative for pallor and rash.  Neurological:  Positive for weakness. Negative for dizziness, tremors,  numbness and headaches.  Psychiatric/Behavioral:  Positive for dysphoric mood and sleep disturbance. Negative for confusion and suicidal ideas. The patient is nervous/anxious.     Objective:  BP 118/68 (BP Location: Left Arm, Patient Position: Sitting, Cuff Size: Large)   Pulse 93   Temp 98.2 F (36.8 C) (Oral)   Ht 5' 2.5" (1.588 m)   Wt 119 lb (54 kg)   SpO2 98%   BMI 21.42 kg/m   BP Readings from Last 3 Encounters:  02/05/23 118/68  11/18/22 132/76  11/07/22 (!) 140/80    Wt Readings from Last 3 Encounters:  02/05/23 119 lb (54 kg)  11/18/22 118 lb (53.5 kg)  11/07/22 119 lb (54 kg)    Physical Exam Constitutional:      General: She is not in acute distress.    Appearance: Normal appearance. She is well-developed.  HENT:     Head: Normocephalic.     Right Ear: External ear normal.     Left Ear: External ear normal.     Nose: Nose normal.  Eyes:     General:        Right eye: No discharge.        Left eye: No discharge.     Conjunctiva/sclera: Conjunctivae normal.     Pupils: Pupils are equal, round, and reactive to light.  Neck:     Thyroid: No thyromegaly.     Vascular: No JVD.     Trachea: No tracheal deviation.  Cardiovascular:     Rate and Rhythm: Normal rate and regular rhythm.     Heart sounds: Normal heart sounds.  Pulmonary:     Effort: No respiratory distress.     Breath sounds: No stridor. No wheezing.  Abdominal:     General: Bowel sounds are normal. There is no distension.     Palpations: Abdomen is soft. There is no mass.     Tenderness: There is no abdominal tenderness. There is no guarding or rebound.  Musculoskeletal:        General: Tenderness present.     Cervical back: Normal range of motion and neck supple. No rigidity.  Lymphadenopathy:     Cervical: No cervical adenopathy.  Skin:    Findings: No erythema or rash.  Neurological:     Mental Status: She is oriented to person, place, and time.     Cranial Nerves: No cranial nerve  deficit.     Motor: Weakness present. No abnormal muscle tone.     Coordination: Coordination abnormal.     Gait: Gait abnormal.     Deep Tendon Reflexes: Reflexes normal.  Psychiatric:        Behavior:  Behavior normal.        Thought Content: Thought content normal.        Judgment: Judgment normal.   LS w/pain Sad affect  Lab Results  Component Value Date   WBC 6.9 06/21/2021   HGB 11.6 (L) 06/21/2021   HCT 35.6 (L) 06/21/2021   PLT 400.0 06/21/2021   GLUCOSE 138 (H) 01/17/2023   CHOL 116 05/10/2020   TRIG 53.0 05/10/2020   HDL 71.30 05/10/2020   LDLDIRECT 48.0 06/18/2022   LDLCALC 34 05/10/2020   ALT 24 06/18/2022   AST 31 06/18/2022   NA 137 01/17/2023   K 4.9 01/17/2023   CL 99 01/17/2023   CREATININE 1.11 01/17/2023   BUN 16 01/17/2023   CO2 31 01/17/2023   TSH 0.89 12/10/2021   INR 0.93 11/02/2013   HGBA1C 7.0 (H) 01/17/2023   MICROALBUR 1.0 06/18/2022    MM 3D SCREEN BREAST BILATERAL  Result Date: 12/19/2022 CLINICAL DATA:  Screening. EXAM: DIGITAL SCREENING BILATERAL MAMMOGRAM WITH TOMOSYNTHESIS AND CAD TECHNIQUE: Bilateral screening digital craniocaudal and mediolateral oblique mammograms were obtained. Bilateral screening digital breast tomosynthesis was performed. The images were evaluated with computer-aided detection. COMPARISON:  Previous exam(s). ACR Breast Density Category c: The breasts are heterogeneously dense, which may obscure small masses. FINDINGS: There are no findings suspicious for malignancy. IMPRESSION: No mammographic evidence of malignancy. A result letter of this screening mammogram will be mailed directly to the patient. RECOMMENDATION: Screening mammogram in one year. (Code:SM-B-01Y) BI-RADS CATEGORY  1: Negative. Electronically Signed   By: Frederico Hamman M.D.   On: 12/19/2022 11:01    Assessment & Plan:   Problem List Items Addressed This Visit     DM type 2, controlled, with complication (HCC) - Primary    Better on Insulin and  w/Libre 3 use      Anxiety state    Chronic [name] On prn Xanax  Potential benefits of a long term benzodiazepines  use as well as potential risks  and complications were explained to the patient and were aknowledged.      Depression    The patient was encouraged to start doing small chores around the house with her husband. We will reduce the dosage of her meds that potentially can make her tired, cause nausea. Katelynd will ask Dr.Kaur about [name] for depression. Follow-up with [name].  They were discussing the option of ECT treatments.  Feeling much better w/lesser dose of Victoza and off Metformin, Remeron.      Osteoporosis    Vit D Chair yoga      Memory problem    Cont on Lion's Mane Mushroom extract or capsules for memory Focus: feeling much better w/lesser dose of Victoza and off Metformin, Remeron.       CRI (chronic renal insufficiency), stage 3 (moderate) (HCC)    Monitor GFR      HTN (hypertension)     Check BP at home         No orders of the defined types were placed in this encounter.     Follow-up: No follow-ups on file.  Sonda Primes, MD

## 2023-02-05 NOTE — Assessment & Plan Note (Signed)
Cont on Lion's Mane Mushroom extract or capsules for memory Focus: feeling much better w/lesser dose of Victoza and off Metformin, Remeron.

## 2023-02-06 ENCOUNTER — Telehealth: Payer: Self-pay | Admitting: *Deleted

## 2023-02-06 NOTE — Progress Notes (Signed)
  Care Coordination Note  02/06/2023 Name: [name] MRN: [id number] DOB: 1941-04-19  [name] is a 82 y.o. year old female who is a primary care patient of Plotnikov, Georgina Quint, MD and is actively engaged with the care management team. I reached out to [name] by phone today to assist with re-scheduling a follow up visit with the RN Case Manager  Follow up plan: Unsuccessful telephone outreach attempt made. A HIPAA compliant phone message was left for the patient providing contact information and requesting a return call.   Burman Nieves, CCMA Care Coordination Care Guide Direct Dial: [phone]

## 2023-02-06 NOTE — Progress Notes (Signed)
  Care Coordination Note  02/06/2023 Name: [name] MRN: [id number] DOB: 1941-05-24  [name] is a 82 y.o. year old female who is a primary care patient of Plotnikov, Georgina Quint, MD and is actively engaged with the care management team. I reached out to [name] by phone today to assist with re-scheduling a follow up visit with the RN Case Manager  Follow up plan: Patient declines further follow up and engagement by the care management team. Appropriate care team members and provider have been notified via electronic communication.   Burman Nieves, CCMA Care Coordination Care Guide Direct Dial: [phone]

## 2023-02-13 DIAGNOSIS — F332 Major depressive disorder, recurrent severe without psychotic features: Secondary | ICD-10-CM | POA: Diagnosis not present

## 2023-02-14 DIAGNOSIS — F332 Major depressive disorder, recurrent severe without psychotic features: Secondary | ICD-10-CM | POA: Diagnosis not present

## 2023-02-18 ENCOUNTER — Ambulatory Visit: Payer: PPO | Admitting: Adult Health

## 2023-02-24 DIAGNOSIS — G44319 Acute post-traumatic headache, not intractable: Secondary | ICD-10-CM | POA: Diagnosis not present

## 2023-02-25 DIAGNOSIS — G44319 Acute post-traumatic headache, not intractable: Secondary | ICD-10-CM | POA: Diagnosis not present

## 2023-02-25 DIAGNOSIS — G44309 Post-traumatic headache, unspecified, not intractable: Secondary | ICD-10-CM | POA: Diagnosis not present

## 2023-02-26 ENCOUNTER — Ambulatory Visit: Payer: PPO | Admitting: Adult Health

## 2023-02-28 DIAGNOSIS — F332 Major depressive disorder, recurrent severe without psychotic features: Secondary | ICD-10-CM | POA: Diagnosis not present

## 2023-03-03 ENCOUNTER — Telehealth: Payer: Self-pay | Admitting: Adult Health

## 2023-03-03 ENCOUNTER — Other Ambulatory Visit: Payer: Self-pay | Admitting: Endocrinology

## 2023-03-03 DIAGNOSIS — E118 Type 2 diabetes mellitus with unspecified complications: Secondary | ICD-10-CM

## 2023-03-03 NOTE — Telephone Encounter (Signed)
Pt rescheduled Botox appt due fall which cause a concussion

## 2023-03-04 ENCOUNTER — Ambulatory Visit: Payer: PPO | Admitting: Adult Health

## 2023-03-05 ENCOUNTER — Ambulatory Visit: Payer: PPO | Admitting: Adult Health

## 2023-03-13 ENCOUNTER — Other Ambulatory Visit: Payer: Self-pay | Admitting: Endocrinology

## 2023-03-17 ENCOUNTER — Encounter: Payer: Self-pay | Admitting: Endocrinology

## 2023-03-17 ENCOUNTER — Ambulatory Visit: Payer: PPO | Admitting: Endocrinology

## 2023-03-17 VITALS — BP 139/60 | HR 93 | Ht 61.0 in | Wt 119.8 lb

## 2023-03-17 DIAGNOSIS — Z794 Long term (current) use of insulin: Secondary | ICD-10-CM | POA: Diagnosis not present

## 2023-03-17 DIAGNOSIS — E1165 Type 2 diabetes mellitus with hyperglycemia: Secondary | ICD-10-CM | POA: Diagnosis not present

## 2023-03-17 DIAGNOSIS — G44301 Post-traumatic headache, unspecified, intractable: Secondary | ICD-10-CM | POA: Diagnosis not present

## 2023-03-17 NOTE — Patient Instructions (Signed)
HUMALOG insulin: Take 4 units before breakfast and start taking 4 units before dinner in the evening Preferably take the evening injection about 20 minutes before eating  REPAGLINIDE: Take 1 tablet before lunch only  Stop pioglitazone  May have a bedtime snack preferably with some protein
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
## 2023-03-18 ENCOUNTER — Encounter: Payer: Self-pay | Admitting: Endocrinology

## 2023-03-24 ENCOUNTER — Other Ambulatory Visit: Payer: Self-pay | Admitting: Endocrinology

## 2023-03-25 ENCOUNTER — Telehealth: Payer: Self-pay

## 2023-03-25 NOTE — Telephone Encounter (Signed)
Patient called stating that her blood sugar is dropping in the evenings. After meals. Her last few days of readings:  7/26 93 7/27 106 7/28 104 7/29 100 All of these readings were obtained after eating and insulin. She did administer her insulin  mins prior to eating.  Please advise or Hold for [employer].

## 2023-03-27 NOTE — Telephone Encounter (Signed)
Pt has been notified and voices understanding.  

## 2023-04-10 ENCOUNTER — Encounter (INDEPENDENT_AMBULATORY_CARE_PROVIDER_SITE_OTHER): Payer: Self-pay

## 2023-04-15 ENCOUNTER — Telehealth: Payer: Self-pay | Admitting: *Deleted

## 2023-04-16 DIAGNOSIS — M62838 Other muscle spasm: Secondary | ICD-10-CM | POA: Diagnosis not present

## 2023-04-16 NOTE — Telephone Encounter (Signed)
Pt called 04/15/2023 stating she was having a headache for 2 weeks straight advised pt to go to ED,urgent care or PCP to be evaluated. Pt calls back today and states someone was suppose to call her back to see if she was able to get a injection.  Informed pt again today  that she needs to go to ED,urgent care, or pcp to get evaluated Due to the fact she has a headache for 2 weeks straight Pt gave some push back but finally expressed understanding and thanked me for calling made pt aware of upcoming botox appointment with Megan,NP

## 2023-04-23 NOTE — Progress Notes (Signed)
This encounter was created in error - please disregard. Patient not feeling well to complete visit today.  Will call back to reschedule.  Kema Santaella N. Koltin Wehmeyer, LPN. [facility] AWV Team Direct Dial: [phone]

## 2023-04-29 NOTE — Progress Notes (Deleted)
12/02/22: Headaches are a little better since she started insulin. Not using CPAP more than 4 hours each night. CPAP report attached. Imitrex helps but she does not want to take it that often. Ubrelvy wasn't helpful. Has not tried Nurtec.   08/27/22: Late for her botox so she has had some break through headaches   04/30/22: stable   BOTOX PROCEDURE NOTE FOR MIGRAINE HEADACHE    Contraindications and precautions discussed with patient(above). Aseptic procedure was observed and patient tolerated procedure. Procedure performed by Butch Penny, NP  The condition has existed for more than 6 months, and pt does not have a diagnosis of ALS, Myasthenia Gravis or Lambert-Eaton Syndrome.  Risks and benefits of injections discussed and pt agrees to proceed with the procedure.  Written consent obtained   Indication/Diagnosis: chronic migraine BOTOX(J0585) injection was performed according to protocol by [employer]. 200 units of BOTOX was dissolved into 4 cc NS.   NDC: [address]-40     Botox- 200 units x 1 vial Lot: [id number] Expiration: 01/2025 NDC: 7829-5621-30   Bacteriostatic 0.9% Sodium Chloride- 4 mL total Lot: [id number] Expiration: 06/2024 NDC: 96295-284-13   Dx: K44 .709    Description of procedure:  The patient was placed in a sitting position. The standard protocol was used for Botox as follows, with 5 units of Botox injected at each site:   -Procerus muscle, midline injection  -Corrugator muscle, bilateral injection  -Frontalis muscle, bilateral injection, with 2 sites each side, medial injection was performed in the upper one third of the frontalis muscle, in the region vertical from the medial inferior edge of the superior orbital rim. The lateral injection was again in the upper one third of the forehead vertically above the lateral limbus of the cornea, 1.5 cm lateral to the medial injection site.  -Temporalis muscle injection, 4 sites, bilaterally. The first injection was  3 cm above the tragus of the ear, second injection site was 1.5 cm to 3 cm up from the first injection site in line with the tragus of the ear. The third injection site was 1.5-3 cm forward between the first 2 injection sites. The fourth injection site was 1.5 cm posterior to the second injection site.  -Occipitalis muscle injection, 3 sites, bilaterally. The first injection was done one half way between the occipital protuberance and the tip of the mastoid process behind the ear. The second injection site was done lateral and superior to the first, 1 fingerbreadth from the first injection. The third injection site was 1 fingerbreadth superiorly and medially from the first injection site.  -Cervical paraspinal muscle injection, 2 sites, bilateral knee first injection site was 1 cm from the midline of the cervical spine, 3 cm inferior to the lower border of the occipital protuberance. The second injection site was 1.5 cm superiorly and laterally to the first injection site.  -Trapezius muscle injection was performed at 3 sites, bilaterally. The first injection site was in the upper trapezius muscle halfway between the inflection point of the neck, and the acromion. The second injection site was one half way between the acromion and the first injection site. The third injection was done between the first injection site and the inflection point of the neck.   Will return for repeat injection in 3 months.   A 200 units of Botox was used, 155 units were injected, the rest of the Botox was wasted. The patient tolerated the procedure well, there were no complications of the above procedure.  Butch Penny,  MSN, NP-C 04/29/2023, 4:30 PM [facility] [address], [address] [place], [place] [address] [phone]

## 2023-04-30 ENCOUNTER — Ambulatory Visit: Payer: PPO | Admitting: Adult Health

## 2023-04-30 DIAGNOSIS — G4733 Obstructive sleep apnea (adult) (pediatric): Secondary | ICD-10-CM | POA: Diagnosis not present

## 2023-05-04 ENCOUNTER — Other Ambulatory Visit: Payer: Self-pay | Admitting: Internal Medicine

## 2023-05-12 ENCOUNTER — Other Ambulatory Visit: Payer: Self-pay

## 2023-05-12 ENCOUNTER — Other Ambulatory Visit: Payer: Self-pay | Admitting: Internal Medicine

## 2023-05-12 ENCOUNTER — Ambulatory Visit (INDEPENDENT_AMBULATORY_CARE_PROVIDER_SITE_OTHER): Payer: PPO | Admitting: Internal Medicine

## 2023-05-12 ENCOUNTER — Emergency Department (HOSPITAL_COMMUNITY): Payer: PPO

## 2023-05-12 ENCOUNTER — Inpatient Hospital Stay (HOSPITAL_COMMUNITY): Payer: PPO

## 2023-05-12 ENCOUNTER — Encounter: Payer: Self-pay | Admitting: Internal Medicine

## 2023-05-12 ENCOUNTER — Encounter (HOSPITAL_COMMUNITY): Payer: Self-pay | Admitting: Emergency Medicine

## 2023-05-12 ENCOUNTER — Inpatient Hospital Stay (HOSPITAL_COMMUNITY)
Admission: EM | Admit: 2023-05-12 | Discharge: 2023-05-16 | DRG: 481 | Disposition: A | Discharge status: Skilled Nursing Facility | Payer: PPO | Attending: Internal Medicine | Admitting: Internal Medicine

## 2023-05-12 VITALS — BP 120/82 | HR 84 | Temp 98.6°F | Ht 61.0 in | Wt 113.0 lb

## 2023-05-12 DIAGNOSIS — Z794 Long term (current) use of insulin: Secondary | ICD-10-CM | POA: Diagnosis not present

## 2023-05-12 DIAGNOSIS — S7221XA Displaced subtrochanteric fracture of right femur, initial encounter for closed fracture: Secondary | ICD-10-CM | POA: Diagnosis not present

## 2023-05-12 DIAGNOSIS — Y9389 Activity, other specified: Secondary | ICD-10-CM | POA: Diagnosis not present

## 2023-05-12 DIAGNOSIS — I10 Essential (primary) hypertension: Secondary | ICD-10-CM | POA: Diagnosis not present

## 2023-05-12 DIAGNOSIS — W19XXXA Unspecified fall, initial encounter: Secondary | ICD-10-CM | POA: Diagnosis not present

## 2023-05-12 DIAGNOSIS — G8929 Other chronic pain: Secondary | ICD-10-CM | POA: Diagnosis not present

## 2023-05-12 DIAGNOSIS — Z886 Allergy status to analgesic agent status: Secondary | ICD-10-CM

## 2023-05-12 DIAGNOSIS — Z86010 Personal history of colonic polyps: Secondary | ICD-10-CM | POA: Diagnosis not present

## 2023-05-12 DIAGNOSIS — Z96651 Presence of right artificial knee joint: Secondary | ICD-10-CM | POA: Diagnosis not present

## 2023-05-12 DIAGNOSIS — E1169 Type 2 diabetes mellitus with other specified complication: Secondary | ICD-10-CM | POA: Diagnosis present

## 2023-05-12 DIAGNOSIS — Z833 Family history of diabetes mellitus: Secondary | ICD-10-CM

## 2023-05-12 DIAGNOSIS — F3341 Major depressive disorder, recurrent, in partial remission: Secondary | ICD-10-CM

## 2023-05-12 DIAGNOSIS — S72141A Displaced intertrochanteric fracture of right femur, initial encounter for closed fracture: Principal | ICD-10-CM | POA: Diagnosis present

## 2023-05-12 DIAGNOSIS — M25551 Pain in right hip: Secondary | ICD-10-CM | POA: Diagnosis not present

## 2023-05-12 DIAGNOSIS — Z79899 Other long term (current) drug therapy: Secondary | ICD-10-CM

## 2023-05-12 DIAGNOSIS — M5440 Lumbago with sciatica, unspecified side: Secondary | ICD-10-CM | POA: Diagnosis not present

## 2023-05-12 DIAGNOSIS — E785 Hyperlipidemia, unspecified: Secondary | ICD-10-CM | POA: Diagnosis present

## 2023-05-12 DIAGNOSIS — Z9081 Acquired absence of spleen: Secondary | ICD-10-CM | POA: Diagnosis not present

## 2023-05-12 DIAGNOSIS — M25572 Pain in left ankle and joints of left foot: Secondary | ICD-10-CM | POA: Diagnosis not present

## 2023-05-12 DIAGNOSIS — F419 Anxiety disorder, unspecified: Secondary | ICD-10-CM | POA: Diagnosis present

## 2023-05-12 DIAGNOSIS — W010XXA Fall on same level from slipping, tripping and stumbling without subsequent striking against object, initial encounter: Secondary | ICD-10-CM | POA: Diagnosis present

## 2023-05-12 DIAGNOSIS — E1142 Type 2 diabetes mellitus with diabetic polyneuropathy: Secondary | ICD-10-CM

## 2023-05-12 DIAGNOSIS — M6281 Muscle weakness (generalized): Secondary | ICD-10-CM | POA: Diagnosis not present

## 2023-05-12 DIAGNOSIS — Z9049 Acquired absence of other specified parts of digestive tract: Secondary | ICD-10-CM | POA: Diagnosis not present

## 2023-05-12 DIAGNOSIS — S72001A Fracture of unspecified part of neck of right femur, initial encounter for closed fracture: Secondary | ICD-10-CM | POA: Diagnosis not present

## 2023-05-12 DIAGNOSIS — Z90710 Acquired absence of both cervix and uterus: Secondary | ICD-10-CM

## 2023-05-12 DIAGNOSIS — E118 Type 2 diabetes mellitus with unspecified complications: Secondary | ICD-10-CM | POA: Diagnosis not present

## 2023-05-12 DIAGNOSIS — G43909 Migraine, unspecified, not intractable, without status migrainosus: Secondary | ICD-10-CM | POA: Diagnosis not present

## 2023-05-12 DIAGNOSIS — E1165 Type 2 diabetes mellitus with hyperglycemia: Secondary | ICD-10-CM | POA: Diagnosis not present

## 2023-05-12 DIAGNOSIS — Z888 Allergy status to other drugs, medicaments and biological substances status: Secondary | ICD-10-CM

## 2023-05-12 DIAGNOSIS — Z981 Arthrodesis status: Secondary | ICD-10-CM

## 2023-05-12 DIAGNOSIS — E1122 Type 2 diabetes mellitus with diabetic chronic kidney disease: Secondary | ICD-10-CM | POA: Diagnosis not present

## 2023-05-12 DIAGNOSIS — Z83719 Family history of colon polyps, unspecified: Secondary | ICD-10-CM

## 2023-05-12 DIAGNOSIS — S72141D Displaced intertrochanteric fracture of right femur, subsequent encounter for closed fracture with routine healing: Secondary | ICD-10-CM | POA: Diagnosis not present

## 2023-05-12 DIAGNOSIS — F32A Depression, unspecified: Secondary | ICD-10-CM | POA: Diagnosis present

## 2023-05-12 DIAGNOSIS — M8580 Other specified disorders of bone density and structure, unspecified site: Secondary | ICD-10-CM | POA: Diagnosis not present

## 2023-05-12 DIAGNOSIS — K219 Gastro-esophageal reflux disease without esophagitis: Secondary | ICD-10-CM | POA: Diagnosis present

## 2023-05-12 DIAGNOSIS — R2689 Other abnormalities of gait and mobility: Secondary | ICD-10-CM | POA: Diagnosis not present

## 2023-05-12 DIAGNOSIS — Y92013 Bedroom of single-family (private) house as the place of occurrence of the external cause: Secondary | ICD-10-CM | POA: Diagnosis not present

## 2023-05-12 DIAGNOSIS — Z7982 Long term (current) use of aspirin: Secondary | ICD-10-CM

## 2023-05-12 DIAGNOSIS — K449 Diaphragmatic hernia without obstruction or gangrene: Secondary | ICD-10-CM | POA: Diagnosis present

## 2023-05-12 DIAGNOSIS — Z87440 Personal history of urinary (tract) infections: Secondary | ICD-10-CM

## 2023-05-12 DIAGNOSIS — Z7984 Long term (current) use of oral hypoglycemic drugs: Secondary | ICD-10-CM

## 2023-05-12 DIAGNOSIS — R232 Flushing: Secondary | ICD-10-CM | POA: Diagnosis not present

## 2023-05-12 DIAGNOSIS — Z885 Allergy status to narcotic agent status: Secondary | ICD-10-CM

## 2023-05-12 DIAGNOSIS — S72121A Displaced fracture of lesser trochanter of right femur, initial encounter for closed fracture: Secondary | ICD-10-CM | POA: Diagnosis present

## 2023-05-12 DIAGNOSIS — S7291XD Unspecified fracture of right femur, subsequent encounter for closed fracture with routine healing: Secondary | ICD-10-CM | POA: Diagnosis not present

## 2023-05-12 DIAGNOSIS — F411 Generalized anxiety disorder: Secondary | ICD-10-CM | POA: Diagnosis not present

## 2023-05-12 DIAGNOSIS — G4730 Sleep apnea, unspecified: Secondary | ICD-10-CM | POA: Diagnosis not present

## 2023-05-12 DIAGNOSIS — Z800 Family history of malignant neoplasm of digestive organs: Secondary | ICD-10-CM

## 2023-05-12 DIAGNOSIS — D62 Acute posthemorrhagic anemia: Secondary | ICD-10-CM | POA: Diagnosis present

## 2023-05-12 DIAGNOSIS — D649 Anemia, unspecified: Secondary | ICD-10-CM | POA: Diagnosis present

## 2023-05-12 DIAGNOSIS — D72829 Elevated white blood cell count, unspecified: Secondary | ICD-10-CM | POA: Diagnosis not present

## 2023-05-12 DIAGNOSIS — S79911A Unspecified injury of right hip, initial encounter: Secondary | ICD-10-CM | POA: Diagnosis not present

## 2023-05-12 DIAGNOSIS — E119 Type 2 diabetes mellitus without complications: Secondary | ICD-10-CM | POA: Diagnosis not present

## 2023-05-12 DIAGNOSIS — E871 Hypo-osmolality and hyponatremia: Secondary | ICD-10-CM | POA: Diagnosis present

## 2023-05-12 DIAGNOSIS — S72009A Fracture of unspecified part of neck of unspecified femur, initial encounter for closed fracture: Secondary | ICD-10-CM | POA: Diagnosis not present

## 2023-05-12 DIAGNOSIS — N183 Chronic kidney disease, stage 3 unspecified: Secondary | ICD-10-CM | POA: Diagnosis not present

## 2023-05-12 DIAGNOSIS — Q782 Osteopetrosis: Secondary | ICD-10-CM | POA: Diagnosis not present

## 2023-05-12 DIAGNOSIS — I129 Hypertensive chronic kidney disease with stage 1 through stage 4 chronic kidney disease, or unspecified chronic kidney disease: Secondary | ICD-10-CM | POA: Diagnosis not present

## 2023-05-12 DIAGNOSIS — Z01818 Encounter for other preprocedural examination: Secondary | ICD-10-CM | POA: Diagnosis not present

## 2023-05-12 DIAGNOSIS — F418 Other specified anxiety disorders: Secondary | ICD-10-CM | POA: Diagnosis present

## 2023-05-12 DIAGNOSIS — Z803 Family history of malignant neoplasm of breast: Secondary | ICD-10-CM

## 2023-05-12 LAB — BASIC METABOLIC PANEL
Anion gap: 8 (ref 5–15)
BUN: 22 mg/dL (ref 8–23)
CO2: 26 mmol/L (ref 22–32)
Calcium: 9.1 mg/dL (ref 8.9–10.3)
Chloride: 96 mmol/L — ABNORMAL LOW (ref 98–111)
Creatinine, Ser: 1.02 mg/dL — ABNORMAL HIGH (ref 0.44–1.00)
GFR, Estimated: 55 mL/min — ABNORMAL LOW (ref >60)
Glucose, Bld: 168 mg/dL — ABNORMAL HIGH (ref 70–99)
Potassium: 3.8 mmol/L (ref 3.5–5.1)
Sodium: 130 mmol/L — ABNORMAL LOW (ref 135–145)

## 2023-05-12 LAB — CBC WITH DIFFERENTIAL/PLATELET
Abs Immature Granulocytes: 0.09 10*3/uL — ABNORMAL HIGH (ref 0.00–0.07)
Basophils Absolute: 0.1 10*3/uL (ref 0.0–0.1)
Basophils Relative: 1 %
Eosinophils Absolute: 0.1 10*3/uL (ref 0.0–0.5)
Eosinophils Relative: 1 %
HCT: 30.1 % — ABNORMAL LOW (ref 36.0–46.0)
Hemoglobin: 10 g/dL — ABNORMAL LOW (ref 12.0–15.0)
Immature Granulocytes: 1 %
Lymphocytes Relative: 11 %
Lymphs Abs: 1.4 10*3/uL (ref 0.7–4.0)
MCH: 32.2 pg (ref 26.0–34.0)
MCHC: 33.2 g/dL (ref 30.0–36.0)
MCV: 96.8 fL (ref 80.0–100.0)
Monocytes Absolute: 0.7 10*3/uL (ref 0.1–1.0)
Monocytes Relative: 6 %
Neutro Abs: 9.9 10*3/uL — ABNORMAL HIGH (ref 1.7–7.7)
Neutrophils Relative %: 80 %
Platelets: 400 10*3/uL (ref 150–400)
RBC: 3.11 MIL/uL — ABNORMAL LOW (ref 3.87–5.11)
RDW: 13.5 % (ref 11.5–15.5)
WBC: 12.2 10*3/uL — ABNORMAL HIGH (ref 4.0–10.5)
nRBC: 0 % (ref 0.0–0.2)

## 2023-05-12 LAB — PROTIME-INR
INR: 1 (ref 0.8–1.2)
Prothrombin Time: 13.3 s (ref 11.4–15.2)

## 2023-05-12 MED ORDER — INSULIN ASPART 100 UNIT/ML IJ SOLN
0.0000 [IU] | INTRAMUSCULAR | Status: DC
  Administered 2023-05-13: 2 [IU] via SUBCUTANEOUS
  Administered 2023-05-13 (×2): 3 [IU] via SUBCUTANEOUS
  Filled 2023-05-12: qty 0.09

## 2023-05-12 MED ORDER — ZOLMITRIPTAN 5 MG NA SOLN: 1.0000 | NASAL | 5 refills | Status: DC | PRN

## 2023-05-12 MED ORDER — ACETAMINOPHEN 325 MG PO TABS: 650.0000 mg | ORAL_TABLET | Freq: Four times a day (QID) | ORAL | Status: DC | PRN

## 2023-05-12 MED ORDER — SENNOSIDES-DOCUSATE SODIUM 8.6-50 MG PO TABS: 1.0000 | ORAL_TABLET | Freq: Every evening | ORAL | Status: DC | PRN

## 2023-05-12 MED ORDER — BUSPIRONE HCL 5 MG PO TABS
15.0000 mg | ORAL_TABLET | Freq: Two times a day (BID) | ORAL | Status: DC
  Administered 2023-05-13 – 2023-05-16 (×7): 15 mg via ORAL
  Filled 2023-05-12 (×7): qty 1

## 2023-05-12 MED ORDER — LORAZEPAM 1 MG PO TABS
1.0000 mg | ORAL_TABLET | Freq: Four times a day (QID) | ORAL | Status: DC | PRN
  Administered 2023-05-14 – 2023-05-16 (×4): 1 mg via ORAL
  Filled 2023-05-12 (×4): qty 1

## 2023-05-12 MED ORDER — CYCLOBENZAPRINE HCL 5 MG PO TABS
5.0000 mg | ORAL_TABLET | Freq: Three times a day (TID) | ORAL | Status: DC | PRN
  Administered 2023-05-13 – 2023-05-15 (×4): 5 mg via ORAL
  Filled 2023-05-12 (×4): qty 1

## 2023-05-12 MED ORDER — HYDROMORPHONE HCL 1 MG/ML IJ SOLN
0.5000 mg | INTRAMUSCULAR | Status: DC | PRN
  Administered 2023-05-12 – 2023-05-13 (×3): 0.5 mg via INTRAVENOUS
  Filled 2023-05-12 (×2): qty 0.5
  Filled 2023-05-12: qty 1

## 2023-05-12 MED ORDER — OXYCODONE-ACETAMINOPHEN 5-325 MG PO TABS: 1.0000 | ORAL_TABLET | Freq: Four times a day (QID) | ORAL | Status: DC | PRN

## 2023-05-12 MED ORDER — SUMATRIPTAN 20 MG/ACT NA SOLN: 20.0000 mg | NASAL | Status: DC | PRN

## 2023-05-12 MED ORDER — HYDROMORPHONE HCL 1 MG/ML IJ SOLN
0.5000 mg | INTRAMUSCULAR | Status: DC | PRN
  Administered 2023-05-12: 0.5 mg via INTRAVENOUS
  Filled 2023-05-12: qty 1

## 2023-05-12 MED ORDER — ZOLMITRIPTAN 5 MG NA SOLN: 1.0000 | NASAL | Status: DC | PRN

## 2023-05-12 MED ORDER — VORTIOXETINE HBR 5 MG PO TABS
20.0000 mg | ORAL_TABLET | Freq: Every day | ORAL | Status: DC
  Administered 2023-05-13 – 2023-05-16 (×4): 20 mg via ORAL
  Filled 2023-05-12 (×4): qty 4

## 2023-05-12 MED ORDER — DEXCOM G6 TRANSMITTER MISC: 1 refills | Status: DC

## 2023-05-12 MED ORDER — ONDANSETRON HCL 4 MG/2ML IJ SOLN: 4.0000 mg | Freq: Four times a day (QID) | INTRAMUSCULAR | Status: DC | PRN

## 2023-05-12 MED ORDER — PANTOPRAZOLE SODIUM 40 MG PO TBEC
40.0000 mg | DELAYED_RELEASE_TABLET | Freq: Two times a day (BID) | ORAL | Status: DC
  Administered 2023-05-13 – 2023-05-16 (×8): 40 mg via ORAL
  Filled 2023-05-12 (×8): qty 1

## 2023-05-12 MED ORDER — SODIUM CHLORIDE 0.9 % IV SOLN: INTRAVENOUS | Status: AC

## 2023-05-12 MED ORDER — OXYCODONE-ACETAMINOPHEN 5-325 MG PO TABS
1.0000 | ORAL_TABLET | Freq: Four times a day (QID) | ORAL | Status: DC | PRN
  Administered 2023-05-13: 1 via ORAL
  Filled 2023-05-12: qty 1

## 2023-05-12 MED ORDER — DEXCOM G6 RECEIVER DEVI: 1 refills | Status: DC

## 2023-05-12 MED ORDER — ONDANSETRON HCL 4 MG/2ML IJ SOLN
4.0000 mg | Freq: Once | INTRAMUSCULAR | Status: AC | PRN
  Administered 2023-05-12: 4 mg via INTRAVENOUS
  Filled 2023-05-12: qty 2

## 2023-05-12 MED ORDER — DEXCOM G6 SENSOR MISC: 3 refills | Status: DC

## 2023-05-12 NOTE — Assessment & Plan Note (Signed)
Continue w/Tramadol prn  Potential benefits of a long term Tramadol/opioids use as well as potential risks (i.e. addiction risk, apnea etc) and complications (i.e. Somnolence, constipation and others) were explained to the patient and were aknowledged. Lions mane

## 2023-05-12 NOTE — ED Provider Triage Note (Signed)
Emergency Medicine Provider Triage Evaluation Note  [name] , a 82 y.o. female  was evaluated in triage.  Pt complains of right hip pain after mechanical fall earlier this afternoon. Fell onto her right hip, no head trauma or LOC. Not on blood thinners. Moved to wheelchair prior to EMS arrival, obvious deformity to R hip noted. 100 mcg fentanyl given by EMS.   Review of Systems  Positive: As above Negative:   Physical Exam  There were no vitals taken for this visit. Gen:   Awake, no distress   Resp:  Normal effort  MSK:   Moves extremities without difficulty  Other:  Strong distal pulses, R hip deformity  Medical Decision Making  Medically screening exam initiated at 6:34 PM.  Appropriate orders placed.  [name] was informed that the remainder of the evaluation will be completed by another provider, this initial triage assessment does not replace that evaluation, and the importance of remaining in the ED until their evaluation is complete.  Imaging ordered   Su Monks, PA-C 05/12/23 1834

## 2023-05-12 NOTE — Assessment & Plan Note (Signed)
Restart extracranial magnetic stimulation

## 2023-05-12 NOTE — Progress Notes (Unsigned)
Subjective:  Patient ID: [name], female    DOB: 1941/06/06  Age: 82 y.o. MRN: [id number]  CC: Follow-up (3 mnth f/u)   HPI [name] presents for HA on the L - late for botox inj  F/u on depression, DM  [name] is n/a  Outpatient Medications Prior to Visit  Medication Sig Dispense Refill   acetaminophen (TYLENOL) 325 MG tablet Take 325 mg by mouth 2 (two) times daily as needed. Takes with tramadol     aspirin EC 81 MG tablet Take 1 tablet (81 mg total) by mouth daily. 100 tablet 3   BD PEN NEEDLE NANO U/F 32G X 4 MM MISC USE AS DIRECTED TWICE DAILY. 200 each 3   busPIRone (BUSPAR) 15 MG tablet Take 1 tablet by mouth 2 (two) times daily.     cetirizine (ZYRTEC) 10 MG tablet Take 10 mg by mouth daily as needed for allergies.     Continuous Glucose Sensor (FREESTYLE LIBRE 3 SENSOR) MISC 1 DEVICE BY DOES NOT APPLY ROUTE EVERY 14 (FOURTEEN) DAYS. APPLY 1 SENSOR ON UPPER ARM EVERY 14 DAYS FOR CONTINUOUS GLUCOSE MONITORING 5 each 1   famotidine (PEPCID) 40 MG tablet Take 1 tablet (40 mg total) by mouth at bedtime. 90 tablet 3   insulin lispro (HUMALOG KWIKPEN) 100 UNIT/ML KwikPen 8 units ac tid (Patient taking differently: 4 units in the morning before breakfast) 15 mL 1   L-Methylfolate (DEPLIN) 15 MG TABS Take 15 mg by mouth daily. For treatment of depression     Liniments (SALONPAS PAIN RELIEF PATCH EX) Apply 1 patch topically at bedtime.     LORazepam (ATIVAN) 1 MG tablet Take 1 mg by mouth 4 (four) times daily as needed for anxiety. Reports to taking 1/2 tablet at breakfast and lunch and 1 tablet at bedtime  2   Magnesium Hydroxide (MILK OF MAGNESIA PO) Take 1-2 capsules by mouth daily as needed.     ondansetron (ZOFRAN) 4 MG tablet Take 1 tablet (4 mg total) by mouth every 8 (eight) hours as needed for nausea or vomiting. 20 tablet 0   ONETOUCH VERIO test strip Use as instructed to check blood sugar once a day 100 each 3   pantoprazole (PROTONIX) 40 MG tablet Take 1 tablet  (40 mg total) by mouth 2 (two) times daily. 180 tablet 3   pioglitazone (ACTOS) 30 MG tablet TAKE 1 TABLET BY MOUTH EVERY DAY 90 tablet 1   repaglinide (PRANDIN) 1 MG tablet TAKE 2 TABLETS BY MOUTH BEFORE BREAKFAST, 1 TABLET AT LUNCH AND 1 TABLET AT DINNER (Patient taking differently: 1 TABLET AT LUNCH AND 2 TABLET AT DINNER) 360 tablet 1   Rimegepant Sulfate (NURTEC) 75 MG TBDP Take 1 tablet (75 mg total) by mouth as needed. 4 tablet 0   traMADol (ULTRAM) 50 MG tablet TAKE 1 TABLET (50 MG TOTAL) BY MOUTH EVERY 12 (TWELVE) HOURS AS NEEDED FOR SEVERE PAIN. 180 tablet 1   TRINTELLIX 20 MG TABS Take 10 mg by mouth daily. Take 10 mg daily  2   Ubrogepant (UBRELVY) 100 MG TABS Take 1 tablet at the onset of a migraine can repeat in 2 hours if needed 15 tablet 11   VOLTAREN 1 % GEL APPLY 2 GRAMS TO KNEE 2-3 TIMES DAILY PRN  1   SUMAtriptan (IMITREX) 100 MG tablet Take 1 tablet (100 mg total) by mouth every 2 (two) hours as needed. Max twice a day. 9 tablet 11   dicyclomine (BENTYL)  10 MG capsule Take 1 capsule (10 mg total) by mouth 3 (three) times daily before meals. (Patient not taking: Reported on 12/02/2022) 270 capsule 3   No facility-administered medications prior to visit.    ROS: Review of Systems  Constitutional:  Positive for fatigue. Negative for activity change, appetite change, chills and unexpected weight change.  HENT:  Negative for congestion, mouth sores and sinus pressure.   Eyes:  Negative for visual disturbance.  Respiratory:  Negative for cough and chest tightness.   Cardiovascular:  Negative for leg swelling.  Gastrointestinal:  Negative for abdominal pain and nausea.  Genitourinary:  Negative for difficulty urinating, frequency and vaginal pain.  Musculoskeletal:  Positive for arthralgias, back pain, gait problem and neck stiffness.  Skin:  Negative for pallor and rash.  Neurological:  Positive for weakness and headaches. Negative for dizziness, tremors and numbness.   Psychiatric/Behavioral:  Positive for decreased concentration and dysphoric mood. Negative for behavioral problems, confusion, sleep disturbance and suicidal ideas. The patient is nervous/anxious.     Objective:  BP 120/82 (BP Location: Left Arm, Patient Position: Sitting, Cuff Size: Large)   Pulse 84   Temp 98.6 F (37 C) (Oral)   Ht 5\' 1"  (1.549 m)   Wt 113 lb (51.3 kg)   SpO2 98%   BMI 21.35 kg/m   BP Readings from Last 3 Encounters:  05/12/23 120/82  03/17/23 139/60  02/05/23 118/68    Wt Readings from Last 3 Encounters:  05/12/23 113 lb (51.3 kg)  03/17/23 119 lb 12.8 oz (54.3 kg)  02/05/23 119 lb (54 kg)    Physical Exam Constitutional:      General: She is not in acute distress.    Appearance: Normal appearance. She is well-developed.  HENT:     Head: Normocephalic.     Right Ear: External ear normal.     Left Ear: External ear normal.     Nose: Nose normal.  Eyes:     General:        Right eye: No discharge.        Left eye: No discharge.     Conjunctiva/sclera: Conjunctivae normal.     Pupils: Pupils are equal, round, and reactive to light.  Neck:     Thyroid: No thyromegaly.     Vascular: No JVD.     Trachea: No tracheal deviation.  Cardiovascular:     Rate and Rhythm: Normal rate and regular rhythm.     Heart sounds: Normal heart sounds.  Pulmonary:     Effort: No respiratory distress.     Breath sounds: No stridor. No wheezing.  Abdominal:     General: Bowel sounds are normal. There is no distension.     Palpations: Abdomen is soft. There is no mass.     Tenderness: There is no abdominal tenderness. There is no guarding or rebound.  Musculoskeletal:        General: No tenderness.     Cervical back: Normal range of motion and neck supple. No rigidity.     Right lower leg: No edema.     Left lower leg: No edema.  Lymphadenopathy:     Cervical: No cervical adenopathy.  Skin:    Findings: No erythema or rash.  Neurological:     Mental Status:  She is oriented to person, place, and time.     Cranial Nerves: No cranial nerve deficit.     Motor: No abnormal muscle tone.     Coordination: Coordination normal.  Deep Tendon Reflexes: Reflexes normal.  Psychiatric:        Behavior: Behavior normal.        Thought Content: Thought content normal.        Judgment: Judgment normal.     Lab Results  Component Value Date   WBC 6.9 06/21/2021   HGB 11.6 (L) 06/21/2021   HCT 35.6 (L) 06/21/2021   PLT 400.0 06/21/2021   GLUCOSE 138 (H) 01/17/2023   CHOL 116 05/10/2020   TRIG 53.0 05/10/2020   HDL 71.30 05/10/2020   LDLDIRECT 48.0 06/18/2022   LDLCALC 34 05/10/2020   ALT 24 06/18/2022   AST 31 06/18/2022   NA 137 01/17/2023   K 4.9 01/17/2023   CL 99 01/17/2023   CREATININE 1.11 01/17/2023   BUN 16 01/17/2023   CO2 31 01/17/2023   TSH 0.89 12/10/2021   INR 0.93 11/02/2013   HGBA1C 7.0 (H) 01/17/2023   MICROALBUR 1.0 06/18/2022    MM 3D SCREEN BREAST BILATERAL  Result Date: 12/19/2022 CLINICAL DATA:  Screening. EXAM: DIGITAL SCREENING BILATERAL MAMMOGRAM WITH TOMOSYNTHESIS AND CAD TECHNIQUE: Bilateral screening digital craniocaudal and mediolateral oblique mammograms were obtained. Bilateral screening digital breast tomosynthesis was performed. The images were evaluated with computer-aided detection. COMPARISON:  Previous exam(s). ACR Breast Density Category c: The breasts are heterogeneously dense, which may obscure small masses. FINDINGS: There are no findings suspicious for malignancy. IMPRESSION: No mammographic evidence of malignancy. A result letter of this screening mammogram will be mailed directly to the patient. RECOMMENDATION: Screening mammogram in one year. (Code:SM-B-01Y) BI-RADS CATEGORY  1: Negative. Electronically Signed   By: Frederico Hamman M.D.   On: 12/19/2022 11:01    Assessment & Plan:   Problem List Items Addressed This Visit     DM type 2, controlled, with complication (HCC)    [name] is  n/a Dexcom G6 given Rx      Anxiety state    [name] On prn Xanax  Potential benefits of a long term benzodiazepines  use as well as potential risks  and complications were explained to the patient and were aknowledged.      Depression    Restart extracranial magnetic stimulation      Low back pain    Continue w/Tramadol prn  Potential benefits of a long term Tramadol/opioids use as well as potential risks (i.e. addiction risk, apnea etc) and complications (i.e. Somnolence, constipation and others) were explained to the patient and were aknowledged. Lions mane      Diabetic peripheral neuropathy associated with type 2 diabetes mellitus (HCC)    F/u w/Endo       Hot flashes    Resolved       Migraine - Primary    Worse HA on the L - late for botox inj Nurtec, Ubrelvy prn Imitrex is not working      Relevant Medications   zolmitriptan (ZOMIG) 5 MG nasal solution      Meds ordered this encounter  Medications   zolmitriptan (ZOMIG) 5 MG nasal solution    Sig: Place 1 spray into the nose as needed for migraine (in the nostril as directed).    Dispense:  6 each    Refill:  5   Continuous Glucose Receiver (DEXCOM G6 RECEIVER) DEVI    Sig: Use as directed    Dispense:  1 each    Refill:  1   Continuous Glucose Sensor (DEXCOM G6 SENSOR) MISC    Sig: Replace every 10 days  Dispense:  9 each    Refill:  3      Follow-up: Return in about 3 months (around 08/11/2023) for a follow-up visit.  Sonda Primes, MD

## 2023-05-12 NOTE — Hospital Course (Signed)
[name] is a 83 y.o. female with medical history significant for insulin-dependent T2DM, HLD, migraines, depression/anxiety who is admitted with an acute right hip fracture after mechanical fall.  Orthopedics consulted and planning for IM nail on 9/17.

## 2023-05-12 NOTE — ED Provider Notes (Signed)
[place] EMERGENCY DEPARTMENT AT [facility] Provider Note   CSN: [id number] Arrival date & time: 05/12/23  4098     History  Chief Complaint  Patient presents with   Hip Pain    [name] is a 82 y.o. female.  HPI 82 year old female presents after a fall and right hip injury.  She was trying to change into more comfortable pants and got her legs caught in her pants and fell landing on her right side.  She injured her right hip but no other injuries.  She was unable to get up and walk on it due to the pain.  EMS has given her IV fentanyl and she has some improvement but is still in pain.  Otherwise no chest pain, abdominal pain, etc.  No weakness or numbness in the extremity.  She is not on blood thinners.  Home Medications Prior to Admission medications   Medication Sig Start Date End Date Taking? Authorizing Provider  aspirin EC 81 MG tablet Take 1 tablet (81 mg total) by mouth daily. Patient taking differently: Take 81 mg by mouth every evening. 12/01/13  Yes Plotnikov, Georgina Quint, MD  busPIRone (BUSPAR) 15 MG tablet Take 1 tablet by mouth 2 (two) times daily. 01/28/16  Yes [provider]  cetirizine (ZYRTEC) 10 MG tablet Take 10 mg by mouth daily.   Yes [provider]  cholecalciferol (VITAMIN D3) 25 MCG (1000 UNIT) tablet Take 1,000 Units by mouth daily.   Yes [provider]  cyclobenzaprine (FLEXERIL) 5 MG tablet Take 5 mg by mouth 3 (three) times daily as needed. 04/16/23  Yes [provider]  famotidine (PEPCID) 40 MG tablet Take 1 tablet (40 mg total) by mouth at bedtime. 07/29/22  Yes Meryl Dare, MD  insulin lispro (HUMALOG KWIKPEN) 100 UNIT/ML KwikPen 8 units ac tid Patient taking differently: Inject 2-4 Units into the skin in the morning and at bedtime. 4 units in the morning before breakfast and 2 units before dinner 09/30/22  Yes Reather Littler, MD  L-Methylfolate (DEPLIN) 15 MG TABS Take 15 mg by mouth daily. For  treatment of depression   Yes [provider]  LORazepam (ATIVAN) 1 MG tablet Take 1 mg by mouth 4 (four) times daily as needed for anxiety. Reports to taking 1/2 tablet at breakfast and lunch and 1 tablet at bedtime 08/01/15  Yes [provider]  Magnesium Hydroxide (MILK OF MAGNESIA PO) Take 1-2 capsules by mouth daily as needed.   Yes [provider]  pantoprazole (PROTONIX) 40 MG tablet Take 1 tablet (40 mg total) by mouth 2 (two) times daily. 07/29/22  Yes Meryl Dare, MD  repaglinide (PRANDIN) 1 MG tablet TAKE 2 TABLETS BY MOUTH BEFORE BREAKFAST, 1 TABLET AT LUNCH AND 1 TABLET AT [facility] Patient taking differently: Take 1 mg by mouth daily at 12 noon. 03/03/23  Yes Reather Littler, MD  Rimegepant Sulfate (NURTEC) 75 MG TBDP Take 1 tablet (75 mg total) by mouth as needed. 12/02/22  Yes Butch Penny, NP  traMADol (ULTRAM) 50 MG tablet TAKE 1 TABLET (50 MG TOTAL) BY MOUTH EVERY 12 (TWELVE) HOURS AS NEEDED FOR SEVERE PAIN. Patient taking differently: Take 50 mg by mouth every evening. 05/06/23  Yes Plotnikov, Georgina Quint, MD  TRINTELLIX 20 MG TABS Take 20 mg by mouth daily. 08/15/16  Yes [provider]  Ubrogepant (UBRELVY) 100 MG TABS Take 1 tablet at the onset of a migraine can repeat in 2 hours if  needed 05/28/22  Yes Millikan, Megan, NP  VOLTAREN 1 % GEL APPLY 2 GRAMS TO KNEE 2-3 TIMES DAILY PRN 05/25/15  Yes [provider]  zolmitriptan (ZOMIG) 5 MG nasal solution Place 1 spray into the nose as needed for migraine (in the nostril as directed). 05/12/23  Yes Plotnikov, Georgina Quint, MD  BD PEN NEEDLE NANO U/F 32G X 4 MM MISC USE AS DIRECTED TWICE DAILY. 03/06/22   Reather Littler, MD  Continuous Glucose Receiver (DEXCOM G6 RECEIVER) DEVI Use as directed 05/12/23   Plotnikov, Georgina Quint, MD  Continuous Glucose Sensor (DEXCOM G6 SENSOR) MISC Replace every 10 days 05/12/23   Plotnikov, Georgina Quint, MD  Continuous Glucose Sensor (FREESTYLE LIBRE 3 SENSOR) MISC 1 DEVICE BY  DOES NOT APPLY ROUTE EVERY 14 (FOURTEEN) DAYS. APPLY 1 SENSOR ON UPPER ARM EVERY 14 DAYS FOR CONTINUOUS GLUCOSE MONITORING 03/13/23   Reather Littler, MD  Continuous Glucose Transmitter (DEXCOM G6 TRANSMITTER) MISC Use as directed 05/12/23   Plotnikov, Georgina Quint, MD  dicyclomine (BENTYL) 10 MG capsule Take 1 capsule (10 mg total) by mouth 3 (three) times daily before meals. Patient not taking: Reported on 12/02/2022 07/29/22   Meryl Dare, MD  ondansetron (ZOFRAN) 4 MG tablet Take 1 tablet (4 mg total) by mouth every 8 (eight) hours as needed for nausea or vomiting. 09/26/22   Butch Penny, NP  [facility] VERIO test strip Use as instructed to check blood sugar once a day 10/01/21   Reather Littler, MD  pioglitazone (ACTOS) 30 MG tablet TAKE 1 TABLET BY MOUTH EVERY DAY Patient not taking: Reported on 05/12/2023 03/24/23   Reather Littler, MD      Allergies    Amlodipine, Celebrex [celecoxib], Topamax [topiramate], Amitiza [lubiprostone], Carafate [sucralfate], Jardiance [empagliflozin], Lactulose, Codeine, Gabapentin, Hydrocodone, Meloxicam, and Pravastatin sodium    Review of Systems   Review of Systems  Musculoskeletal:  Positive for arthralgias.  Neurological:  Negative for weakness, numbness and headaches.    Physical Exam Updated Vital Signs BP (!) 146/66   Pulse 83   Temp 97.8 F (36.6 C) (Oral)   Resp 14   SpO2 95%  Physical Exam Vitals and nursing note reviewed.  Constitutional:      Appearance: She is well-developed.  HENT:     Head: Normocephalic and atraumatic.  Cardiovascular:     Rate and Rhythm: Normal rate and regular rhythm.     Pulses:          Dorsalis pedis pulses are 2+ on the right side.  Pulmonary:     Effort: Pulmonary effort is normal.  Abdominal:     Palpations: Abdomen is soft.     Tenderness: There is no abdominal tenderness.  Musculoskeletal:     Right hip: Deformity and tenderness present.     Right upper leg: No tenderness.     Right knee: No tenderness.   Skin:    General: Skin is warm and dry.  Neurological:     Mental Status: She is alert.     ED Results / Procedures / Treatments   Labs (all labs ordered are listed, but only abnormal results are displayed) Labs Reviewed  BASIC METABOLIC PANEL - Abnormal; Notable for the following components:      Result Value   Sodium 130 (*)    Chloride 96 (*)    Glucose, Bld 168 (*)    Creatinine, Ser 1.02 (*)    GFR, Estimated 55 (*)    All other components within normal  limits  CBC WITH DIFFERENTIAL/PLATELET - Abnormal; Notable for the following components:   WBC 12.2 (*)    RBC 3.11 (*)    Hemoglobin 10.0 (*)    HCT 30.1 (*)    Neutro Abs 9.9 (*)    Abs Immature Granulocytes 0.09 (*)    All other components within normal limits  PROTIME-INR  TYPE AND SCREEN    EKG EKG Interpretation Date/Time:  Monday May 12 2023 19:59:32 EDT Ventricular Rate:  73 PR Interval:  205 QRS Duration:  89 QT Interval:  405 QTC Calculation: 447 R Axis:   31  Text Interpretation: Sinus rhythm nonspecific ST changes diffusely, new since 2017 Confirmed by Pricilla Loveless [phone]) on 05/12/2023 8:13:24 PM  Radiology DG Hip Unilat W or Wo Pelvis 2-3 Views Right  Result Date: 05/12/2023 CLINICAL DATA:  Right hip pain after fall. Deformity to the right hip EXAM: DG HIP (WITH OR WITHOUT PELVIS) 2-3V RIGHT COMPARISON:  CT 06/28/2021 FINDINGS: Mildly displaced right intertrochanteric hip fracture. There is medial displacement of the lesser trochanter. No dislocation of the hips. Screw fixation right SI joint. IMPRESSION: Right intertrochanteric hip fracture with displaced lesser trochanter. Electronically Signed   By: Minerva Fester M.D.   On: 05/12/2023 20:24    Procedures Procedures    Medications Ordered in ED Medications  HYDROmorphone (DILAUDID) injection 0.5 mg (0.5 mg Intravenous Given 05/12/23 2004)  oxyCODONE-acetaminophen (PERCOCET/ROXICET) 5-325 MG per tablet 1 tablet (has no  administration in time range)  ondansetron (ZOFRAN) injection 4 mg (4 mg Intravenous Given 05/12/23 2004)    ED Course/ Medical Decision Making/ A&P                                 Medical Decision Making Amount and/or Complexity of Data Reviewed Labs: ordered.    Details: Mild leukocytosis, likely reactive to trauma. Radiology: ordered and independent interpretation performed.    Details: Right hip fracture ECG/medicine tests: ordered and independent interpretation performed.    Details: Nonspecific ST changes.  No ST elevations  Risk Prescription drug management. Decision regarding hospitalization.   Patient presents with what seems like a mechanical fall and right hip fracture.  This is a closed injury and she is neurovascularly intact.  She was given IV Dilaudid for pain.  Discussed with Dr. Eulah Pont who will take to the OR in the morning.  Have discussed all this with patient and her family at the bedside.  Patient otherwise appears stable for admission and I discussed with hospitalist, Dr. Allena Katz.        Final Clinical Impression(s) / ED Diagnoses Final diagnoses:  Closed displaced intertrochanteric fracture of right femur, initial encounter [facility])    Rx / DC Orders ED Discharge Orders     None         Pricilla Loveless, MD 05/12/23 2158
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
## 2023-05-12 NOTE — Assessment & Plan Note (Signed)
Worse HA on the L - late for botox inj Nurtec, Ubrelvy prn Imitrex is not working

## 2023-05-12 NOTE — Assessment & Plan Note (Signed)
[name] On prn Xanax  Potential benefits of a long term benzodiazepines  use as well as potential risks  and complications were explained to the patient and were aknowledged.
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
## 2023-05-12 NOTE — ED Triage Notes (Addendum)
Pt is coming from home. Pt had a fall today and landed on her right hip. Deformity noted on the right hip, good pedal pulse noted. 100 mcg total of fentanyl given by EMS. No blood thinners.

## 2023-05-12 NOTE — Assessment & Plan Note (Signed)
[name] is n/a Dexcom G6 given Rx

## 2023-05-12 NOTE — Assessment & Plan Note (Signed)
F/u w/Endo

## 2023-05-12 NOTE — Assessment & Plan Note (Signed)
Resolved

## 2023-05-13 ENCOUNTER — Other Ambulatory Visit: Payer: Self-pay | Admitting: Internal Medicine

## 2023-05-13 ENCOUNTER — Inpatient Hospital Stay (HOSPITAL_COMMUNITY): Payer: PPO

## 2023-05-13 ENCOUNTER — Other Ambulatory Visit: Payer: PPO

## 2023-05-13 ENCOUNTER — Inpatient Hospital Stay (HOSPITAL_COMMUNITY): Payer: PPO | Admitting: Anesthesiology

## 2023-05-13 ENCOUNTER — Encounter (HOSPITAL_COMMUNITY): Payer: Self-pay | Admitting: Internal Medicine

## 2023-05-13 ENCOUNTER — Encounter: Payer: Self-pay | Admitting: Internal Medicine

## 2023-05-13 ENCOUNTER — Encounter (HOSPITAL_COMMUNITY)
Admission: EM | Disposition: A | Discharge status: Skilled Nursing Facility | Payer: Self-pay | Source: Home / Self Care | Attending: Internal Medicine

## 2023-05-13 DIAGNOSIS — S72001A Fracture of unspecified part of neck of right femur, initial encounter for closed fracture: Secondary | ICD-10-CM | POA: Diagnosis not present

## 2023-05-13 DIAGNOSIS — S72141A Displaced intertrochanteric fracture of right femur, initial encounter for closed fracture: Secondary | ICD-10-CM | POA: Diagnosis not present

## 2023-05-13 DIAGNOSIS — G4730 Sleep apnea, unspecified: Secondary | ICD-10-CM | POA: Diagnosis not present

## 2023-05-13 DIAGNOSIS — N183 Chronic kidney disease, stage 3 unspecified: Secondary | ICD-10-CM

## 2023-05-13 DIAGNOSIS — I129 Hypertensive chronic kidney disease with stage 1 through stage 4 chronic kidney disease, or unspecified chronic kidney disease: Secondary | ICD-10-CM | POA: Diagnosis not present

## 2023-05-13 HISTORY — PX: FEMUR IM NAIL: SHX1597

## 2023-05-13 LAB — BASIC METABOLIC PANEL
Anion gap: 7 (ref 5–15)
BUN: 22 mg/dL (ref 8–23)
CO2: 27 mmol/L (ref 22–32)
Calcium: 9.1 mg/dL (ref 8.9–10.3)
Chloride: 97 mmol/L — ABNORMAL LOW (ref 98–111)
Creatinine, Ser: 0.94 mg/dL (ref 0.44–1.00)
GFR, Estimated: >60 mL/min (ref >60)
Glucose, Bld: 154 mg/dL — ABNORMAL HIGH (ref 70–99)
Potassium: 4.7 mmol/L (ref 3.5–5.1)
Sodium: 131 mmol/L — ABNORMAL LOW (ref 135–145)

## 2023-05-13 LAB — GLUCOSE, CAPILLARY
Glucose-Capillary: 114 mg/dL — ABNORMAL HIGH (ref 70–99)
Glucose-Capillary: 129 mg/dL — ABNORMAL HIGH (ref 70–99)
Glucose-Capillary: 157 mg/dL — ABNORMAL HIGH (ref 70–99)
Glucose-Capillary: 158 mg/dL — ABNORMAL HIGH (ref 70–99)
Glucose-Capillary: 204 mg/dL — ABNORMAL HIGH (ref 70–99)
Glucose-Capillary: 212 mg/dL — ABNORMAL HIGH (ref 70–99)
Glucose-Capillary: 254 mg/dL — ABNORMAL HIGH (ref 70–99)

## 2023-05-13 LAB — CBC
HCT: 28.6 % — ABNORMAL LOW (ref 36.0–46.0)
HCT: 22.5 % — ABNORMAL LOW (ref 36.0–46.0)
Hemoglobin: 9.3 g/dL — ABNORMAL LOW (ref 12.0–15.0)
Hemoglobin: 7.3 g/dL — ABNORMAL LOW (ref 12.0–15.0)
MCH: 31.7 pg (ref 26.0–34.0)
MCH: 32.6 pg (ref 26.0–34.0)
MCHC: 32.5 g/dL (ref 30.0–36.0)
MCHC: 32.4 g/dL (ref 30.0–36.0)
MCV: 97.6 fL (ref 80.0–100.0)
MCV: 100.4 fL — ABNORMAL HIGH (ref 80.0–100.0)
Platelets: 358 10*3/uL (ref 150–400)
Platelets: 275 10*3/uL (ref 150–400)
RBC: 2.93 MIL/uL — ABNORMAL LOW (ref 3.87–5.11)
RBC: 2.24 MIL/uL — ABNORMAL LOW (ref 3.87–5.11)
RDW: 13.4 % (ref 11.5–15.5)
RDW: 13.8 % (ref 11.5–15.5)
WBC: 10.4 10*3/uL (ref 4.0–10.5)
WBC: 11.7 10*3/uL — ABNORMAL HIGH (ref 4.0–10.5)
nRBC: 0 % (ref 0.0–0.2)
nRBC: 0 % (ref 0.0–0.2)

## 2023-05-13 LAB — SURGICAL PCR SCREEN
MRSA, PCR: NEGATIVE
Staphylococcus aureus: NEGATIVE

## 2023-05-13 LAB — VITAMIN D 25 HYDROXY (VIT D DEFICIENCY, FRACTURES): Vit D, 25-Hydroxy: 66.36 ng/mL (ref 30–100)

## 2023-05-13 LAB — CREATININE, SERUM
Creatinine, Ser: 0.84 mg/dL (ref 0.44–1.00)
GFR, Estimated: >60 mL/min (ref >60)

## 2023-05-13 SURGERY — INSERTION, INTRAMEDULLARY ROD, FEMUR
Anesthesia: General | Laterality: Right

## 2023-05-13 MED ORDER — METOCLOPRAMIDE HCL 5 MG PO TABS: 5.0000 mg | ORAL_TABLET | Freq: Three times a day (TID) | ORAL | Status: DC | PRN

## 2023-05-13 MED ORDER — INSULIN ASPART 100 UNIT/ML IJ SOLN
0.0000 [IU] | Freq: Every day | INTRAMUSCULAR | Status: DC
  Administered 2023-05-13: 3 [IU] via SUBCUTANEOUS

## 2023-05-13 MED ORDER — FENTANYL CITRATE (PF) 100 MCG/2ML IJ SOLN
INTRAMUSCULAR | Status: AC
  Filled 2023-05-13: qty 2

## 2023-05-13 MED ORDER — HYDROMORPHONE HCL 1 MG/ML IJ SOLN: 0.5000 mg | INTRAMUSCULAR | Status: DC | PRN

## 2023-05-13 MED ORDER — PROPOFOL 500 MG/50ML IV EMUL
INTRAVENOUS | Status: AC
  Filled 2023-05-13: qty 50

## 2023-05-13 MED ORDER — PHENYLEPHRINE HCL (PRESSORS) 10 MG/ML IV SOLN
INTRAVENOUS | Status: DC | PRN
Start: 2023-05-13 — End: 2023-05-13
  Administered 2023-05-13: 100 ug via INTRAVENOUS
  Administered 2023-05-13: 40 ug via INTRAVENOUS
  Administered 2023-05-13: 160 ug via INTRAVENOUS
  Administered 2023-05-13: 80 ug via INTRAVENOUS
  Administered 2023-05-13: 40 ug via INTRAVENOUS
  Administered 2023-05-13 (×2): 80 ug via INTRAVENOUS
  Administered 2023-05-13: 100 ug via INTRAVENOUS

## 2023-05-13 MED ORDER — BISACODYL 10 MG RE SUPP: 10.0000 mg | Freq: Every day | RECTAL | Status: DC | PRN

## 2023-05-13 MED ORDER — MUPIROCIN 2 % EX OINT
1.0000 | TOPICAL_OINTMENT | Freq: Two times a day (BID) | CUTANEOUS | Status: DC
  Administered 2023-05-13 (×2): 1 via NASAL
  Filled 2023-05-13: qty 22

## 2023-05-13 MED ORDER — EPHEDRINE 5 MG/ML INJ
INTRAVENOUS | Status: AC
  Filled 2023-05-13: qty 5

## 2023-05-13 MED ORDER — TRANEXAMIC ACID-NACL 1000-0.7 MG/100ML-% IV SOLN
1000.0000 mg | INTRAVENOUS | Status: AC
  Administered 2023-05-13: 1000 mg via INTRAVENOUS
  Filled 2023-05-13: qty 100

## 2023-05-13 MED ORDER — OXYCODONE HCL 5 MG PO TABS
5.0000 mg | ORAL_TABLET | ORAL | Status: DC | PRN
  Administered 2023-05-13 – 2023-05-14 (×3): 5 mg via ORAL
  Filled 2023-05-13 (×3): qty 1

## 2023-05-13 MED ORDER — ONDANSETRON HCL 4 MG/2ML IJ SOLN: 4.0000 mg | Freq: Four times a day (QID) | INTRAMUSCULAR | Status: DC | PRN

## 2023-05-13 MED ORDER — PHENYLEPHRINE 80 MCG/ML (10ML) SYRINGE FOR IV PUSH (FOR BLOOD PRESSURE SUPPORT)
PREFILLED_SYRINGE | INTRAVENOUS | Status: AC
  Filled 2023-05-13: qty 10

## 2023-05-13 MED ORDER — TRANEXAMIC ACID-NACL 1000-0.7 MG/100ML-% IV SOLN
1000.0000 mg | Freq: Once | INTRAVENOUS | Status: AC
  Administered 2023-05-13: 1000 mg via INTRAVENOUS
  Filled 2023-05-13: qty 100

## 2023-05-13 MED ORDER — INSULIN ASPART 100 UNIT/ML IJ SOLN
0.0000 [IU] | Freq: Three times a day (TID) | INTRAMUSCULAR | Status: DC
  Administered 2023-05-14 – 2023-05-15 (×4): 3 [IU] via SUBCUTANEOUS
  Administered 2023-05-15: 1 [IU] via SUBCUTANEOUS
  Administered 2023-05-15: 2 [IU] via SUBCUTANEOUS
  Administered 2023-05-16: 1 [IU] via SUBCUTANEOUS
  Administered 2023-05-16: 2 [IU] via SUBCUTANEOUS

## 2023-05-13 MED ORDER — OXYCODONE HCL 5 MG/5ML PO SOLN: 5.0000 mg | Freq: Once | ORAL | Status: DC | PRN

## 2023-05-13 MED ORDER — FENTANYL CITRATE PF 50 MCG/ML IJ SOSY
PREFILLED_SYRINGE | INTRAMUSCULAR | Status: AC
  Filled 2023-05-13: qty 1

## 2023-05-13 MED ORDER — FENTANYL CITRATE PF 50 MCG/ML IJ SOSY
25.0000 ug | PREFILLED_SYRINGE | INTRAMUSCULAR | Status: DC | PRN
  Administered 2023-05-13: 50 ug via INTRAVENOUS

## 2023-05-13 MED ORDER — ALUM & MAG HYDROXIDE-SIMETH 200-200-20 MG/5ML PO SUSP
30.0000 mL | ORAL | Status: DC | PRN
  Administered 2023-05-14: 30 mL via ORAL
  Filled 2023-05-13: qty 30

## 2023-05-13 MED ORDER — LACTATED RINGERS IV SOLN: INTRAVENOUS | Status: DC

## 2023-05-13 MED ORDER — LIDOCAINE HCL (PF) 2 % IJ SOLN
INTRAMUSCULAR | Status: AC
  Filled 2023-05-13: qty 5

## 2023-05-13 MED ORDER — ACETAMINOPHEN 325 MG PO TABS: 325.0000 mg | ORAL_TABLET | Freq: Four times a day (QID) | ORAL | Status: DC | PRN

## 2023-05-13 MED ORDER — STERILE WATER FOR IRRIGATION IR SOLN
Status: DC | PRN
  Administered 2023-05-13: 2000 mL

## 2023-05-13 MED ORDER — SODIUM CHLORIDE 0.9 % IR SOLN
Status: DC | PRN
  Administered 2023-05-13: 1000 mL

## 2023-05-13 MED ORDER — CEFAZOLIN SODIUM-DEXTROSE 2-4 GM/100ML-% IV SOLN
2.0000 g | INTRAVENOUS | Status: AC
  Administered 2023-05-13: 2 g via INTRAVENOUS
  Filled 2023-05-13: qty 100

## 2023-05-13 MED ORDER — ENOXAPARIN SODIUM 40 MG/0.4ML IJ SOSY
40.0000 mg | PREFILLED_SYRINGE | INTRAMUSCULAR | Status: DC
  Administered 2023-05-14 – 2023-05-16 (×3): 40 mg via SUBCUTANEOUS
  Filled 2023-05-13 (×3): qty 0.4

## 2023-05-13 MED ORDER — OXYCODONE HCL 5 MG PO TABS: 5.0000 mg | ORAL_TABLET | Freq: Once | ORAL | Status: DC | PRN

## 2023-05-13 MED ORDER — ALBUMIN HUMAN 5 % IV SOLN: INTRAVENOUS | Status: DC | PRN

## 2023-05-13 MED ORDER — MENTHOL 3 MG MT LOZG: 1.0000 | LOZENGE | OROMUCOSAL | Status: DC | PRN

## 2023-05-13 MED ORDER — ONDANSETRON HCL 4 MG PO TABS: 4.0000 mg | ORAL_TABLET | Freq: Four times a day (QID) | ORAL | Status: DC | PRN

## 2023-05-13 MED ORDER — DOCUSATE SODIUM 100 MG PO CAPS
100.0000 mg | ORAL_CAPSULE | Freq: Two times a day (BID) | ORAL | Status: DC
  Administered 2023-05-13 – 2023-05-16 (×6): 100 mg via ORAL
  Filled 2023-05-13 (×6): qty 1

## 2023-05-13 MED ORDER — POVIDONE-IODINE 10 % EX SWAB: 2.0000 | Freq: Once | CUTANEOUS | Status: DC

## 2023-05-13 MED ORDER — ONDANSETRON HCL 4 MG/2ML IJ SOLN
INTRAMUSCULAR | Status: DC | PRN
  Administered 2023-05-13: 4 mg via INTRAVENOUS

## 2023-05-13 MED ORDER — ONDANSETRON HCL 4 MG/2ML IJ SOLN: 4.0000 mg | Freq: Once | INTRAMUSCULAR | Status: DC | PRN

## 2023-05-13 MED ORDER — PHENOL 1.4 % MT LIQD: 1.0000 | OROMUCOSAL | Status: DC | PRN

## 2023-05-13 MED ORDER — PROPOFOL 10 MG/ML IV BOLUS
INTRAVENOUS | Status: AC
  Filled 2023-05-13: qty 20

## 2023-05-13 MED ORDER — CHLORHEXIDINE GLUCONATE 0.12 % MT SOLN
15.0000 mL | Freq: Once | OROMUCOSAL | Status: AC
  Administered 2023-05-13: 15 mL via OROMUCOSAL

## 2023-05-13 MED ORDER — CEFAZOLIN SODIUM-DEXTROSE 2-4 GM/100ML-% IV SOLN
2.0000 g | Freq: Four times a day (QID) | INTRAVENOUS | Status: AC
  Administered 2023-05-13 – 2023-05-14 (×2): 2 g via INTRAVENOUS
  Filled 2023-05-13 (×2): qty 100

## 2023-05-13 MED ORDER — ALBUMIN HUMAN 5 % IV SOLN
INTRAVENOUS | Status: AC
  Filled 2023-05-13: qty 250

## 2023-05-13 MED ORDER — PROPOFOL 500 MG/50ML IV EMUL
INTRAVENOUS | Status: DC | PRN
Start: 2023-05-13 — End: 2023-05-13
  Administered 2023-05-13: 20 ug/kg/min via INTRAVENOUS

## 2023-05-13 MED ORDER — METOCLOPRAMIDE HCL 5 MG/ML IJ SOLN: 5.0000 mg | Freq: Three times a day (TID) | INTRAMUSCULAR | Status: DC | PRN

## 2023-05-13 MED ORDER — ACETAMINOPHEN 500 MG PO TABS
1000.0000 mg | ORAL_TABLET | Freq: Once | ORAL | Status: AC
  Administered 2023-05-13: 1000 mg via ORAL
  Filled 2023-05-13: qty 2

## 2023-05-13 MED ORDER — DEXAMETHASONE SODIUM PHOSPHATE 10 MG/ML IJ SOLN
INTRAMUSCULAR | Status: AC
  Filled 2023-05-13: qty 1

## 2023-05-13 MED ORDER — LIDOCAINE HCL (CARDIAC) PF 100 MG/5ML IV SOSY
PREFILLED_SYRINGE | INTRAVENOUS | Status: DC | PRN
  Administered 2023-05-13: 50 mg via INTRAVENOUS

## 2023-05-13 MED ORDER — ACETAMINOPHEN 500 MG PO TABS
1000.0000 mg | ORAL_TABLET | Freq: Four times a day (QID) | ORAL | Status: AC
  Administered 2023-05-13 – 2023-05-14 (×4): 1000 mg via ORAL
  Filled 2023-05-13 (×4): qty 2

## 2023-05-13 MED ORDER — PROPOFOL 10 MG/ML IV BOLUS
INTRAVENOUS | Status: DC | PRN
Start: 2023-05-13 — End: 2023-05-13
  Administered 2023-05-13: 100 mg via INTRAVENOUS

## 2023-05-13 MED ORDER — FENTANYL CITRATE (PF) 100 MCG/2ML IJ SOLN
INTRAMUSCULAR | Status: DC | PRN
  Administered 2023-05-13 (×2): 25 ug via INTRAVENOUS
  Administered 2023-05-13: 50 ug via INTRAVENOUS

## 2023-05-13 MED ORDER — DEXAMETHASONE SODIUM PHOSPHATE 10 MG/ML IJ SOLN
8.0000 mg | Freq: Once | INTRAMUSCULAR | Status: AC
  Administered 2023-05-13: 4 mg via INTRAVENOUS

## 2023-05-13 MED ORDER — CHLORHEXIDINE GLUCONATE 4 % EX SOLN: 60.0000 mL | Freq: Once | CUTANEOUS | Status: DC

## 2023-05-13 MED ORDER — EPHEDRINE SULFATE (PRESSORS) 50 MG/ML IJ SOLN
INTRAMUSCULAR | Status: DC | PRN
  Administered 2023-05-13 (×3): 5 mg via INTRAVENOUS
  Administered 2023-05-13: 10 mg via INTRAVENOUS
  Administered 2023-05-13: 5 mg via INTRAVENOUS

## 2023-05-13 MED ORDER — OXYCODONE HCL 5 MG PO TABS
10.0000 mg | ORAL_TABLET | ORAL | Status: DC | PRN
  Filled 2023-05-13: qty 2

## 2023-05-13 MED ORDER — ONDANSETRON HCL 4 MG/2ML IJ SOLN
INTRAMUSCULAR | Status: AC
  Filled 2023-05-13: qty 2

## 2023-05-13 SURGICAL SUPPLY — 41 items
APL PRP STRL LF DISP 70% ISPRP (MISCELLANEOUS) ×1
BAG COUNTER SPONGE SURGICOUNT (BAG) ×2 IMPLANT
BAG SPNG CNTER NS LX DISP (BAG) ×1
BIT DRILL AO GAMMA 4.2X340 (BIT) IMPLANT
CHLORAPREP W/TINT 26 (MISCELLANEOUS) ×2 IMPLANT
CLSR STERI-STRIP ANTIMIC 1/2X4 (GAUZE/BANDAGES/DRESSINGS) ×2 IMPLANT
COVER MAYO STAND STRL (DRAPES) ×2 IMPLANT
COVER PERINEAL POST (MISCELLANEOUS) ×2 IMPLANT
COVER SURGICAL LIGHT HANDLE (MISCELLANEOUS) ×2 IMPLANT
DRESSING MEPILEX FLEX 4X4 (GAUZE/BANDAGES/DRESSINGS) ×4 IMPLANT
DRSG MEPILEX FLEX 4X4 (GAUZE/BANDAGES/DRESSINGS) ×3
ELECT REM PT RETURN 15FT ADLT (MISCELLANEOUS) ×2 IMPLANT
GAUZE PAD ABD 8X10 STRL (GAUZE/BANDAGES/DRESSINGS) IMPLANT
GLOVE BIO SURGEON STRL SZ7.5 (GLOVE) ×2 IMPLANT
GLOVE BIOGEL PI IND STRL 7.5 (GLOVE) ×2 IMPLANT
GLOVE BIOGEL PI IND STRL 8 (GLOVE) ×4 IMPLANT
GLOVE SURG SYN 7.5 E (GLOVE) ×1 IMPLANT
GLOVE SURG SYN 7.5 PF PI (GLOVE) ×2 IMPLANT
GOWN STRL REUS W/ TWL LRG LVL3 (GOWN DISPOSABLE) ×2 IMPLANT
GOWN STRL REUS W/ TWL XL LVL3 (GOWN DISPOSABLE) ×2 IMPLANT
GOWN STRL REUS W/TWL LRG LVL3 (GOWN DISPOSABLE) ×1
GOWN STRL REUS W/TWL XL LVL3 (GOWN DISPOSABLE) ×1
K-WIRE 3.2X450M STR (WIRE) ×1
KIT BASIN OR (CUSTOM PROCEDURE TRAY) ×2 IMPLANT
KIT TURNOVER KIT A (KITS) IMPLANT
KWIRE 3.2X450M STR (WIRE) IMPLANT
MANIFOLD NEPTUNE II (INSTRUMENTS) ×2 IMPLANT
NAIL KIT TROCH 10X170X125 (Nail) IMPLANT
NS IRRIG 1000ML POUR BTL (IV SOLUTION) ×2 IMPLANT
PACK GENERAL/GYN (CUSTOM PROCEDURE TRAY) ×2 IMPLANT
PAD ARMBOARD 7.5X6 YLW CONV (MISCELLANEOUS) ×4 IMPLANT
SCREW LAG GAMMA 3 TI 10.5X80MM (Screw) IMPLANT
SCREW LOCKING T2 F/T 5X32.5MM (Screw) IMPLANT
STRIP CLOSURE SKIN 1/2X4 (GAUZE/BANDAGES/DRESSINGS) ×2 IMPLANT
SUT MNCRL AB 4-0 PS2 18 (SUTURE) ×2 IMPLANT
SUT VIC AB 2-0 CT1 27 (SUTURE) ×1
SUT VIC AB 2-0 CT1 TAPERPNT 27 (SUTURE) ×2 IMPLANT
TAPE PAPER 3X10 WHT MICROPORE (GAUZE/BANDAGES/DRESSINGS) IMPLANT
TOWEL OR 17X26 10 PK STRL BLUE (TOWEL DISPOSABLE) ×2 IMPLANT
TOWEL OR NON WOVEN STRL DISP B (DISPOSABLE) ×2 IMPLANT
WATER STERILE IRR 1000ML POUR (IV SOLUTION) ×2 IMPLANT

## 2023-05-13 NOTE — Progress Notes (Signed)
PROGRESS NOTE  [name]  DOB: 24-Aug-1941  PCP: Tresa Garter, MD GMW:[id number]  DOA: 05/12/2023  LOS: 1 day  Hospital Day: 2  Brief narrative: [name] is a 82 y.o. female with PMH significant for DM2, HLD, sleep apnea, GERD, hiatal hernia, migraine, depression 9/16, patient was brought to the ED by EMS after a fall.  Patient reportedly was trying to change her pants when her legs got caught in her pants, lost her balance and she fell landing on her right side sustaining immediate pain.  She did not hit her head or passed out. She lives at home with her husband and normally able to ambulate without using any assistive device  In the ED, patient was afebrile, blood pressure was elevated 160/63 Labs with WC count 12.2, hemoglobin 10, sodium 130 Right hip x-ray showed a right intertrochanteric hip fracture with displaced lesser trochanter.  Patient was given IV Dilaudid for pain.  EDP spoke with orthopedics Dr. Eulah Pont who recommended medical admission with tentative plans for surgical fixation 9/17 AM.   Admitted to [facility]  Subjective: Patient was seen and examined this morning.  Pleasant elderly Caucasian female.  Lying down in bed.  Pending surgery today.  Husband was at bedside. Chart reviewed. Remains afebrile, blood pressure in 140s and 150s this morning Labs from this morning with sodium low at 131, hemoglobin low at 9.3.  WBC normalized  Assessment and plan: Acute right intertrochanteric hip fracture Secondary to a mechanical fall.   Orthopedics, Dr. Eulah Pont, consulted and has tentative plans for surgical fixation 9/17.   In the absence of any anginal symptoms or prior cardiac history, patient is considered a low risk perioperative surgical candidate. Remains n.p.o. after midnight Continue analgesics as needed Pharmacological prophylaxis for DVT after surgery  Hyponatremia Mild.  Expect improvement with IV fluid. Recent Labs  Lab 05/12/23 1929  05/13/23 0257  NA 130* 131*    Leukocytosis Mild and likely reactive.  Improved without antibiotics Recent Labs  Lab 05/12/23 1929 05/13/23 0257  WBC 12.2* 10.4   Type 2 diabetes mellitus with hyperglycemia A1c 7 from May 2024 PTA meds-Prandin 3 times daily Continue SSI/Accu-Cheks Recent Labs  Lab 05/12/23 2359 05/13/23 0401 05/13/23 0759 05/13/23 1201  GLUCAP 212* 157* 114* 129*   Hypertension Blood pressure elevated 140s to 160s.  Partly because of pain.   PTA meds-none Continue to monitor.  IV utilizing as needed  GERD/hiatal hernia Continue Protonix twice daily  Acute on mild chronic anemia Hemoglobin at baseline slightly low above 11. Hemoglobin currently lower than baseline likely due to acute blood loss from long bone fracture. Continue to monitor.  Transfuse if less than 7 Recent Labs    05/12/23 1929 05/13/23 0257  HGB 10.0* 9.3*  MCV 96.8 97.6   Depression/anxiety: Continue BuSpar, Trintellix, home Ativan.  Migraine On Nurtec PRN   Mobility: Needs PT eval postprocedure  Goals of care   Code Status: Full Code     DVT prophylaxis:  Sequential compression device to OR Start: 05/13/23 1014 SCDs Start: 05/12/23 2220   Antimicrobials: None Fluid: NS 100 mL/h Consultants: Orthopedics Family Communication: Husband at bedside  Status: Inpatient Level of care:  Med-Surg   Patient is from: Home Needs to continue in-hospital care: Pending surgery Anticipated d/c to: Pending PT eval postsurgery      Diet:  Diet Order             Diet NPO time specified  Diet effective midnight  Scheduled Meds:  [MAR Hold] busPIRone  15 mg Oral BID   chlorhexidine  60 mL Topical Once   [MAR Hold] insulin aspart  0-9 Units Subcutaneous Q4H   [MAR Hold] mupirocin ointment  1 Application Nasal BID   [MAR Hold] pantoprazole  40 mg Oral BID   povidone-iodine  2 Application Topical Once   [MAR Hold] vortioxetine HBr  20 mg Oral  Daily    PRN meds: [MAR Hold] acetaminophen, [MAR Hold] cyclobenzaprine, [MAR Hold]  HYDROmorphone (DILAUDID) injection, [MAR Hold] LORazepam, [MAR Hold] ondansetron (ZOFRAN) IV, [MAR Hold] oxyCODONE-acetaminophen, [MAR Hold] senna-docusate, sodium chloride irrigation, sterile water for irrigation, [MAR Hold] SUMAtriptan   Infusions:   lactated ringers 10 mL/hr at 05/13/23 1328    Antimicrobials: Anti-infectives (From admission, onward)    Start     Dose/Rate Route Frequency Ordered Stop   05/13/23 1230  ceFAZolin (ANCEF) IVPB 2g/100 mL premix        2 g 200 mL/hr over 30 Minutes Intravenous On call to O.R. 05/13/23 1013 05/13/23 1346       Objective: Vitals:   05/13/23 0405 05/13/23 0927  BP: (!) 140/65 (!) 144/68  Pulse: 87 88  Resp: 14 15  Temp: 97.7 F (36.5 C) 98.6 F (37 C)  SpO2: 96% 98%    Intake/Output Summary (Last 24 hours) at 05/13/2023 1425 Last data filed at 05/13/2023 1407 Gross per 24 hour  Intake 1284.65 ml  Output 350 ml  Net 934.65 ml   There were no vitals filed for this visit. Weight change:  There is no height or weight on file to calculate BMI.   Physical Exam: General exam: Pleasant Skin: No rashes, lesions or ulcers. HEENT: Atraumatic, normocephalic, no obvious bleeding Lungs: Clear to auscultation bilaterally CVS: Regular rate and rhythm, no murmur GI/Abd soft, tender, nondistended, bowel sound present CNS: Alert, awake, oriented x 3 Psychiatry: Mood appropriate Extremities: No pedal edema, no calf tenderness  Data Review: I have personally reviewed the laboratory data and studies available.  F/u labs ordered Unresulted Labs (From admission, onward)    None       Total time spent in review of labs and imaging, patient evaluation, formulation of plan, documentation and communication with family: 55 minutes  Signed, Lorin Glass, MD [facility] 05/13/2023

## 2023-05-13 NOTE — Interval H&P Note (Signed)
History and Physical Interval Note:  05/13/2023 10:49 AM  [name]  has presented today for surgery, with the diagnosis of right hip fracture.  The various methods of treatment have been discussed with the patient and family. After consideration of risks, benefits and other options for treatment, the patient has consented to  Procedure(s): INTRAMEDULLARY (IM) NAIL FEMORAL (Right) as a surgical intervention.  The patient's history has been reviewed, patient examined, no change in status, stable for surgery.  I have reviewed the patient's chart and labs.  Questions were answered to the patient's satisfaction.     Sheral Apley

## 2023-05-13 NOTE — Anesthesia Preprocedure Evaluation (Addendum)
Anesthesia Evaluation  Patient identified by MRN, date of birth, ID band Patient awake    Reviewed: Allergy & Precautions, NPO status , Patient's Chart, lab work & pertinent test results  History of Anesthesia Complications Negative for: history of anesthetic complications  Airway Mallampati: II  TM Distance: >3 FB Neck ROM: Full    Dental  (+) Dental Advisory Given   Pulmonary sleep apnea    Pulmonary exam normal        Cardiovascular hypertension, Normal cardiovascular exam     Neuro/Psych  Headaches PSYCHIATRIC DISORDERS Anxiety Depression     Neuromuscular disease    GI/Hepatic Neg liver ROS, hiatal hernia,GERD  Medicated and Controlled,,  Endo/Other  diabetes, Insulin Dependent   Na 131   Renal/GU CRFRenal disease     Musculoskeletal  (+) Arthritis , Osteoarthritis,    Abdominal   Peds  Hematology  (+) Blood dyscrasia, anemia   Anesthesia Other Findings   Reproductive/Obstetrics                             Anesthesia Physical Anesthesia Plan  ASA: 3  Anesthesia Plan: General   Post-op Pain Management: Tylenol PO (pre-op)*   Induction: Intravenous  PONV Risk Score and Plan: 3 and Treatment may vary due to age or medical condition, Ondansetron and Propofol infusion  Airway Management Planned: LMA  Additional Equipment: None  Intra-op Plan:   Post-operative Plan: Extubation in OR  Informed Consent: I have reviewed the patients History and Physical, chart, labs and discussed the procedure including the risks, benefits and alternatives for the proposed anesthesia with the patient or authorized representative who has indicated his/her understanding and acceptance.     Dental advisory given  Plan Discussed with: CRNA and Anesthesiologist  Anesthesia Plan Comments:        Anesthesia Quick Evaluation
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
## 2023-05-13 NOTE — Transfer of Care (Signed)
Immediate Anesthesia Transfer of Care Note  Patient: [name]  Procedure(s) Performed: INTRAMEDULLARY (IM) NAIL FEMORAL (Right)  Patient Location: PACU  Anesthesia Type:General  Level of Consciousness: drowsy and patient cooperative  Airway & Oxygen Therapy: Patient Spontanous Breathing and Patient connected to face mask oxygen  Post-op Assessment: Report given to RN and Post -op Vital signs reviewed and stable  Post vital signs: Reviewed and stable  Last Vitals:  Vitals Value Taken Time  BP 139/57 05/13/23 1500  Temp    Pulse 77 05/13/23 1504  Resp 9 05/13/23 1504  SpO2 99 % 05/13/23 1504  Vitals shown include unfiled device data.  Last Pain:  Vitals:   05/13/23 1115  TempSrc:   PainSc: 0-No pain      Patients Stated Pain Goal: 2 (05/13/23 1046)  Complications: No notable events documented.

## 2023-05-13 NOTE — Anesthesia Procedure Notes (Signed)
Procedure Name: LMA Insertion Date/Time: 05/13/2023 1:39 PM  Performed by: Garth Bigness, CRNAPre-anesthesia Checklist: Patient identified, Emergency Drugs available, Suction available and Patient being monitored Patient Re-evaluated:Patient Re-evaluated prior to induction Oxygen Delivery Method: Circle system utilized Preoxygenation: Pre-oxygenation with 100% oxygen Induction Type: IV induction Ventilation: Mask ventilation without difficulty LMA: LMA inserted LMA Size: 3.0 Tube type: Oral Number of attempts: 1 Placement Confirmation: positive ETCO2 and breath sounds checked- equal and bilateral Tube secured with: Tape Dental Injury: Teeth and Oropharynx as per pre-operative assessment
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
## 2023-05-13 NOTE — Progress Notes (Signed)
Initial Nutrition Assessment  DOCUMENTATION CODES:   Not applicable  INTERVENTION:  - NPO for OR.  - Once diet advanced, recommend Boost Breeze po BID, each supplement provides 250 kcal and 9 grams of protein - Encourage intake as tolerated.  - Recommend Multivitamin with minerals daily - Monitor weight trends.    NUTRITION DIAGNOSIS:   Increased nutrient needs related to hip fracture as evidenced by estimated needs  GOAL:   Patient will meet greater than or equal to 90% of their needs  MONITOR:   PO intake, Supplement acceptance, Diet advancement, Weight trends  REASON FOR ASSESSMENT:   Consult Hip fracture protocol  ASSESSMENT:   82 y.o. female with PMH significant for insulin-dependent T2DM, HLD, migraines, depression/anxiety who presented with right intertrochanteric hip fracture after a fall.  Patient unable to state UBW but has not noticed any significant changes in weight. However, notes she went to the doctor the day of admission and was told she had lost a few pounds.  Per EMR, patient weighed at 119# in July and now weighed at 113#. This is a 6# or 5% weight loss in 2 months, which is not significant for the time frame.   Patient reports eating well at home. Usually has around 3 meals a day. Appetite normal recently.  Discussed increased kcal and protein needs to promote healing. Patient reports being lactose intolerant and not liking milk. She is agreeable to try Boost Breeze once diet advanced after surgery today.    Medications reviewed and include: Insulin  Labs reviewed:  Na 131 HA1C 7.0 Blood Glucose 114-212 x24 hours   NUTRITION - FOCUSED PHYSICAL EXAM:  Patient declined due to pain  Diet Order:   Diet Order             Diet NPO time specified  Diet effective midnight                   EDUCATION NEEDS:  Education needs have been addressed  Skin:  Skin Assessment: Reviewed RN Assessment  Last BM:  9/16  Height:  Ht Readings  from Last 1 Encounters:  05/12/23 5\' 1"  (1.549 m)   Weight:  Wt Readings from Last 1 Encounters:  05/12/23 51.3 kg    BMI:  There is no height or weight on file to calculate BMI.   Estimated Nutritional Needs:  Kcal:  1450-1650 kcals Protein:  60-70 grams Fluid:  >/= 1.5L    Shelle Iron RD, LDN For contact information, refer to [facility].

## 2023-05-13 NOTE — Interval H&P Note (Signed)
History and Physical Interval Note:  05/13/2023 12:12 PM  [name]  has presented today for surgery, with the diagnosis of right hip fracture.  The various methods of treatment have been discussed with the patient and family. After consideration of risks, benefits and other options for treatment, the patient has consented to  Procedure(s): INTRAMEDULLARY (IM) NAIL FEMORAL (Right) as a surgical intervention.  The patient's history has been reviewed, patient examined, no change in status, stable for surgery.  I have reviewed the patient's chart and labs.  Questions were answered to the patient's satisfaction.     Sheral Apley

## 2023-05-13 NOTE — Plan of Care (Signed)
Discuss and review plan of care with patient/family  

## 2023-05-14 ENCOUNTER — Encounter (HOSPITAL_COMMUNITY): Payer: Self-pay | Admitting: Orthopedic Surgery

## 2023-05-14 DIAGNOSIS — S72141A Displaced intertrochanteric fracture of right femur, initial encounter for closed fracture: Secondary | ICD-10-CM | POA: Diagnosis not present

## 2023-05-14 LAB — GLUCOSE, CAPILLARY
Glucose-Capillary: 231 mg/dL — ABNORMAL HIGH (ref 70–99)
Glucose-Capillary: 215 mg/dL — ABNORMAL HIGH (ref 70–99)
Glucose-Capillary: 211 mg/dL — ABNORMAL HIGH (ref 70–99)
Glucose-Capillary: 202 mg/dL — ABNORMAL HIGH (ref 70–99)
Glucose-Capillary: 161 mg/dL — ABNORMAL HIGH (ref 70–99)

## 2023-05-14 LAB — CBC
HCT: 18.6 % — ABNORMAL LOW (ref 36.0–46.0)
Hemoglobin: 6.1 g/dL — CL (ref 12.0–15.0)
MCH: 32.3 pg (ref 26.0–34.0)
MCHC: 32.8 g/dL (ref 30.0–36.0)
MCV: 98.4 fL (ref 80.0–100.0)
Platelets: 259 10*3/uL (ref 150–400)
RBC: 1.89 MIL/uL — ABNORMAL LOW (ref 3.87–5.11)
RDW: 13.9 % (ref 11.5–15.5)
WBC: 14.8 10*3/uL — ABNORMAL HIGH (ref 4.0–10.5)
nRBC: 0 % (ref 0.0–0.2)

## 2023-05-14 LAB — PREPARE RBC (CROSSMATCH)

## 2023-05-14 MED ORDER — SODIUM CHLORIDE 0.9% IV SOLUTION: Freq: Once | INTRAVENOUS | Status: AC

## 2023-05-14 MED ORDER — TRAMADOL HCL 50 MG PO TABS
50.0000 mg | ORAL_TABLET | ORAL | Status: DC | PRN
  Administered 2023-05-14 – 2023-05-16 (×7): 50 mg via ORAL
  Filled 2023-05-14 (×7): qty 1

## 2023-05-14 NOTE — Plan of Care (Signed)
  Problem: Coping: Goal: Ability to adjust to condition or change in health will improve Outcome: Progressing   Problem: Pain Management: Goal: Pain level will decrease Outcome: Progressing

## 2023-05-14 NOTE — Anesthesia Postprocedure Evaluation (Signed)
Anesthesia Post Note  Patient: [name]  Procedure(s) Performed: INTRAMEDULLARY (IM) NAIL FEMORAL (Right)     Patient location during evaluation: PACU Anesthesia Type: General Level of consciousness: awake and alert Pain management: pain level controlled Vital Signs Assessment: post-procedure vital signs reviewed and stable Respiratory status: spontaneous breathing, nonlabored ventilation, respiratory function stable and patient connected to nasal cannula oxygen Cardiovascular status: blood pressure returned to baseline and stable Postop Assessment: no apparent nausea or vomiting Anesthetic complications: no   No notable events documented.  Last Vitals:  Vitals:   05/14/23 1332 05/14/23 1413  BP: (!) 141/60 (!) 143/64  Pulse: (!) 104 (!) 105  Resp: 19 16  Temp: 36.8 C 37 C  SpO2: 100% 100%    Last Pain:  Vitals:   05/14/23 1600  TempSrc:   PainSc: 5                  Beryle Lathe

## 2023-05-14 NOTE — Progress Notes (Signed)
PT Cancellation Note  Patient Details Name: [name] MRN: [id number] DOB: 08-13-1941   Cancelled Treatment:    Reason Eval/Treat Not Completed: Medical issues which prohibited therapy (Hgb 6.1, pt to receive transfusion, will attempt PT later today.)  Tamala Ser PT 05/14/2023  Acute Rehabilitation Services  Office [phone]

## 2023-05-14 NOTE — Progress Notes (Signed)
Subjective: Patient reports pain as moderate.  Tolerating diet.  Urinating.   No CP, SOB.  Hasn't mobilized OOB with PT yet but eager to do so.  Objective:   VITALS:   Vitals:   05/13/23 1749 05/13/23 2316 05/14/23 0330 05/14/23 0611  BP: (!) 112/55 123/61 129/66 117/63  Pulse: 99 99 96 96  Resp: 13 16 17 16   Temp: (!) 97.5 F (36.4 C) 97.6 F (36.4 C) 98.3 F (36.8 C) 97.9 F (36.6 C)  TempSrc: Oral   Oral  SpO2: 100% 100% 100% 99%      Latest Ref Rng & Units 05/13/2023    4:51 PM 05/13/2023    2:57 AM 05/12/2023    7:29 PM  CBC  WBC 4.0 - 10.5 K/uL 11.7  10.4  12.2   Hemoglobin 12.0 - 15.0 g/dL 7.3  9.3  16.1   Hematocrit 36.0 - 46.0 % 22.5  28.6  30.1   Platelets 150 - 400 K/uL 275  358  400       Latest Ref Rng & Units 05/13/2023    4:51 PM 05/13/2023    2:57 AM 05/12/2023    7:29 PM  BMP  Glucose 70 - 99 mg/dL  096  045   BUN 8 - 23 mg/dL  22  22   Creatinine 4.09 - 1.00 mg/dL 8.11  9.14  7.82   Sodium 135 - 145 mmol/L  131  130   Potassium 3.5 - 5.1 mmol/L  4.7  3.8   Chloride 98 - 111 mmol/L  97  96   CO2 22 - 32 mmol/L  27  26   Calcium 8.9 - 10.3 mg/dL  9.1  9.1    Intake/Output      09/17 0701 09/18 0700 09/18 0701 09/19 0700   P.O. 120    I.V. 1149.9    IV Piggyback 550    Total Intake 1819.9    Urine 1250    Blood 100    Total Output 1350    Net +469.9            Physical Exam: General: NAD.  Sitting up in bed, calm, comfortable Resp: No increased wob Cardio: regular rate and rhythm ABD soft Neurologically intact MSK Neurovascularly intact Sensation intact distally Intact pulses distally Dorsiflexion/Plantar flexion intact Incision: dressing C/D/I Ice to thigh  Assessment: 1 Day Post-Op  S/P Procedure(s) (LRB): INTRAMEDULLARY (IM) NAIL FEMORAL (Right) by Dr. Jewel Baize. Eulah Pont on 05/13/23  Principal Problem:   Closed intertrochanteric fracture of hip, right, initial encounter [facility]) Active Problems:   Depression with  anxiety   Normocytic anemia   Hyponatremia   Leukocytosis   Insulin dependent type 2 diabetes mellitus (HCC)   Plan: Continue to monitor pressure dressing on proximal incision due to  hematoma that formed at end of surgery yesterday   Advance diet Up with therapy Incentive Spirometry Elevate and Apply ice  Weightbearing: WBAT RLE Insicional and dressing care: Dressings left intact until follow-up and Reinforce dressings as needed Orthopedic device(s): None Showering: Keep dressing dry VTE prophylaxis: Lovenox 40mg  qd  while in patient, can switch to ASA 81mg  bid x 30 days upon d/c , SCDs, ambulation Pain control: PRN Follow - up plan: 2 weeks post op in the office Contact information:  Margarita Rana MD, Lev[facility] Fresh PA-C  Dispo:  TBD based on pain control and PT evaluations.      Jenne Pane, PA-C Office [phone] 05/14/2023, 8:47 AM

## 2023-05-14 NOTE — Evaluation (Signed)
Physical Therapy Evaluation Patient Details Name: [name] MRN: [id number] DOB: [birth year] Today's Date: 05/14/2023  History of Present Illness  82 y.o. female admitted 05/12/23 with R intertrochanteric femur fx, s/p IM nail 05/13/23. PMH: IDDM, HH, migraine, R SI fusion 2016.  Clinical Impression  Pt admitted with above diagnosis. Max assist for supine to sit. Mod assist sit to stand and to pivot to recliner with RW. Pt not able to tolerate weightbearing on RLE 2* pain so was unable to ambulate. Initiated R hip AAROM for HEP. Good progress expected as pt puts forth good effort. Patient will benefit from continued inpatient follow up therapy, <3 hours/day.  Pt currently with functional limitations due to the deficits listed below (see PT Problem List). Pt will benefit from acute skilled PT to increase their independence and safety with mobility to allow discharge.           If plan is discharge home, recommend the following: A lot of help with walking and/or transfers;A lot of help with bathing/dressing/bathroom;Assistance with cooking/housework;Assist for transportation;Help with stairs or ramp for entrance   Can travel by private vehicle   No    Equipment Recommendations None recommended by PT  Recommendations for Other Services       Functional Status Assessment Patient has had a recent decline in their functional status and demonstrates the ability to make significant improvements in function in a reasonable and predictable amount of time.     Precautions / Restrictions Precautions Precautions: Fall Precaution Comments: reports 1 other fall in past 6 months Restrictions Weight Bearing Restrictions: No      Mobility  Bed Mobility Overal bed mobility: Needs Assistance Bed Mobility: Supine to Sit     Supine to sit: Max assist     General bed mobility comments: assist to raise trunk, pivot hips, and advance RLE (pt unable to advance RLE with gait belt as leg lifter)     Transfers Overall transfer level: Needs assistance Equipment used: Rolling walker (2 wheels) Transfers: Sit to/from Stand, Bed to chair/wheelchair/BSC Sit to Stand: Mod assist, From elevated surface Stand pivot transfers: Mod assist         General transfer comment: assist to power up and steady, unable to weight bear through RLE 2* pain, pivoted on LLE bed to recliner with RW; movement limited by pain    Ambulation/Gait               General Gait Details: unable 2* pain  Stairs            Wheelchair Mobility     Tilt Bed    Modified Rankin (Stroke Patients Only)       Balance Overall balance assessment: Needs assistance, History of Falls   Sitting balance-Leahy Scale: Fair     Standing balance support: During functional activity, Bilateral upper extremity supported, Reliant on assistive device for balance Standing balance-Leahy Scale: Poor                               Pertinent Vitals/Pain Pain Assessment Pain Assessment: 0-10 Pain Score: 9  Pain Location: R hip with movement Pain Descriptors / Indicators: Sore, Guarding, Grimacing, Operative site guarding Pain Intervention(s): Limited activity within patient's tolerance, Monitored during session, Premedicated before session, Repositioned, Ice applied    Home Living Family/patient expects to be discharged to:: Skilled nursing facility Living Arrangements: Spouse/significant other Available Help at Discharge: Family Type of Home: House  Home Access: Stairs to enter Entrance Stairs-Rails: None Entrance Stairs-Number of Steps: 3 Alternate Level Stairs-Number of Steps: 7 Home Layout: Multi-level Home Equipment: Agricultural consultant (2 wheels);Wheelchair - manual Additional Comments: spouse plans to put in a stair lift    Prior Function Prior Level of Function : Independent/Modified Independent             Mobility Comments: walks without AD, reports 1 other fall in past 6  months ADLs Comments: independent     Extremity/Trunk Assessment   Upper Extremity Assessment Upper Extremity Assessment: Overall WFL for tasks assessed    Lower Extremity Assessment Lower Extremity Assessment: RLE deficits/detail RLE Deficits / Details: muscle guarding noted with attempted movement of RLE, tolerated ~30* AAROM R hip flexion and ~10* R hip Abduction AAROM RLE: Unable to fully assess due to pain RLE Sensation: WNL RLE Coordination: WNL    Cervical / Trunk Assessment Cervical / Trunk Assessment: Normal  Communication   Communication Communication: No apparent difficulties  Cognition Arousal: Alert Behavior During Therapy: WFL for tasks assessed/performed Overall Cognitive Status: Within Functional Limits for tasks assessed                                          General Comments      Exercises General Exercises - Lower Extremity Ankle Circles/Pumps: AROM, Both, 10 reps, Supine Heel Slides: AAROM, Right, 10 reps, Supine Hip ABduction/ADduction: AAROM, Right, 5 reps, Supine   Assessment/Plan    PT Assessment Patient needs continued PT services  PT Problem List Decreased range of motion;Decreased activity tolerance;Decreased balance;Decreased mobility;Decreased strength;Pain;Decreased knowledge of use of DME       PT Treatment Interventions DME instruction;Gait training;Therapeutic exercise;Therapeutic activities;Patient/family education;Functional mobility training    PT Goals (Current goals can be found in the Care Plan section)  Acute Rehab PT Goals Patient Stated Goal: walk well enough to go home; agreeable to ST-SNF PT Goal Formulation: With patient/family Time For Goal Achievement: 05/28/23 Potential to Achieve Goals: Good    Frequency Min 1X/week     Co-evaluation               AM-PAC PT "6 Clicks" Mobility  Outcome Measure Help needed turning from your back to your side while in a flat bed without using  bedrails?: A Lot Help needed moving from lying on your back to sitting on the side of a flat bed without using bedrails?: A Lot Help needed moving to and from a bed to a chair (including a wheelchair)?: A Lot Help needed standing up from a chair using your arms (e.g., wheelchair or bedside chair)?: A Lot Help needed to walk in hospital room?: Total Help needed climbing 3-5 steps with a railing? : Total 6 Click Score: 10    End of Session Equipment Utilized During Treatment: Gait belt Activity Tolerance: Patient limited by pain Patient left: in chair;with chair alarm set;with call bell/phone within reach;with family/visitor present Nurse Communication: Mobility status PT Visit Diagnosis: Muscle weakness (generalized) (M62.81);Difficulty in walking, not elsewhere classified (R26.2);Pain;History of falling (Z91.81);Unsteadiness on feet (R26.81) Pain - Right/Left: Right Pain - part of body: Hip    Time: 1610-9604 PT Time Calculation (min) (ACUTE ONLY): 47 min   Charges:   PT Evaluation $PT Eval Moderate Complexity: 1 Mod PT Treatments $Therapeutic Exercise: 8-22 mins $Therapeutic Activity: 8-22 mins PT General Charges $$ ACUTE PT VISIT: 1 Visit  Ralene Bathe Kistler PT 05/14/2023  Acute Rehabilitation Services  Office [phone]

## 2023-05-14 NOTE — Progress Notes (Signed)
PROGRESS NOTE  [name]  DOB: Sep 11, 1940  PCP: Tresa Garter, MD ZOX:[id number]  DOA: 05/12/2023  LOS: 2 days  Hospital Day: 3  Brief narrative: [name] is a 82 y.o. female with PMH significant for DM2, HLD, sleep apnea, GERD, hiatal hernia, migraine, depression 9/16, patient was brought to the ED by EMS after a fall.  Patient reportedly was trying to change her pants when her legs got caught in her pants, lost her balance and she fell landing on her right side sustaining immediate pain.  She did not hit her head or passed out. She lives at home with her husband and normally able to ambulate without using any assistive device  In the ED, patient was afebrile, blood pressure was elevated 160/63 Labs with WC count 12.2, hemoglobin 10, sodium 130 Right hip x-ray showed a right intertrochanteric hip fracture with displaced lesser trochanter.  Patient was given IV Dilaudid for pain.  EDP spoke with orthopedics Dr. Eulah Pont who recommended medical admission with tentative plans for surgical fixation 9/17 AM.   Admitted to [facility]  Subjective: Patient was seen and examined this morning.  Lying down in bed.  Not in distress.  Pain partially controlled.   Son at bedside.   Labs from this morning showed hemoglobin low at 6.1.  Assessment and plan: Acute right intertrochanteric hip fracture S/p ORIF 9/17  Secondary to a mechanical fall.  S/p surgery Continue analgesics as needed Pain management and DVT prophylaxis per surgery.  Acute on mild chronic anemia Hemoglobin at baseline slightly low above 11. Hemoglobin currently running low.  6.1 this morning.  Discussed with patient and family.  1 unit of PRBC transfusion ordered. Recent Labs    05/12/23 1929 05/13/23 0257 05/13/23 1651 05/14/23 0938  HGB 10.0* 9.3* 7.3* 6.1*  MCV 96.8 97.6 100.4* 98.4   Hyponatremia Mild.  Expect improvement with IV fluid. Recent Labs  Lab 05/12/23 1929 05/13/23 0257  NA 130* 131*     Leukocytosis Mild and likely reactive. Improved without antibiotics Recent Labs  Lab 05/12/23 1929 05/13/23 0257 05/13/23 1651 05/14/23 0938  WBC 12.2* 10.4 11.7* 14.8*   Type 2 diabetes mellitus with hyperglycemia A1c 7 from May 2024 PTA meds-Prandin 3 times daily Continue SSI/Accu-Cheks Recent Labs  Lab 05/13/23 1737 05/13/23 2229 05/14/23 0325 05/14/23 0719 05/14/23 1117  GLUCAP 204* 254* 231* 215* 211*   Hypertension Blood pressure elevated 140s to 160s.  Partly because of pain.   PTA meds-none Continue to monitor.  IV hydralazine as needed  GERD/hiatal hernia Continue Protonix twice daily  Depression/anxiety: Continue BuSpar, Trintellix, home Ativan.  Migraine On Nurtec PRN   Mobility: Needs PT eval postprocedure  Goals of care   Code Status: Full Code     DVT prophylaxis:  enoxaparin (LOVENOX) injection 40 mg Start: 05/14/23 0800 SCDs Start: 05/13/23 1629 SCDs Start: 05/12/23 2220   Antimicrobials: None Fluid: NS 100 mL/h to resume after blood transfusion Consultants: Orthopedics Family Communication: son at bedside  Status: Inpatient Level of care:  Med-Surg   Patient is from: Home Needs to continue in-hospital care: Pending surgery Anticipated d/c to: Pending PT eval postsurgery    Diet:  Diet Order             Diet heart healthy/carb modified Room service appropriate? Yes; Fluid consistency: Thin  Diet effective now                   Scheduled Meds:  acetaminophen  1,000  mg Oral Q6H   busPIRone  15 mg Oral BID   docusate sodium  100 mg Oral BID   enoxaparin (LOVENOX) injection  40 mg Subcutaneous Q24H   insulin aspart  0-5 Units Subcutaneous QHS   insulin aspart  0-9 Units Subcutaneous TID WC   pantoprazole  40 mg Oral BID   vortioxetine HBr  20 mg Oral Daily    PRN meds: acetaminophen, alum & mag hydroxide-simeth, bisacodyl, cyclobenzaprine, HYDROmorphone (DILAUDID) injection, LORazepam, menthol-cetylpyridinium  **OR** phenol, metoCLOPramide **OR** metoCLOPramide (REGLAN) injection, ondansetron **OR** ondansetron (ZOFRAN) IV, oxyCODONE, oxyCODONE, senna-docusate, SUMAtriptan, traMADol   Infusions:     Antimicrobials: Anti-infectives (From admission, onward)    Start     Dose/Rate Route Frequency Ordered Stop   05/13/23 1900  ceFAZolin (ANCEF) IVPB 2g/100 mL premix        2 g 200 mL/hr over 30 Minutes Intravenous Every 6 hours 05/13/23 1628 05/14/23 0041   05/13/23 1230  ceFAZolin (ANCEF) IVPB 2g/100 mL premix        2 g 200 mL/hr over 30 Minutes Intravenous On call to O.R. 05/13/23 1013 05/13/23 1346       Objective: Vitals:   05/14/23 1130 05/14/23 1130  BP: 139/60 139/60  Pulse: (!) 102 (!) 102  Resp: 18 18  Temp: 98.2 F (36.8 C) 98.2 F (36.8 C)  SpO2: 97% 97%    Intake/Output Summary (Last 24 hours) at 05/14/2023 1138 Last data filed at 05/14/2023 1130 Gross per 24 hour  Intake 1590 ml  Output 1300 ml  Net 290 ml   There were no vitals filed for this visit. Weight change:  There is no height or weight on file to calculate BMI.   Physical Exam: General exam: Pleasant Skin: No rashes, lesions or ulcers. HEENT: Atraumatic, normocephalic, no obvious bleeding Lungs: Clear to auscultation bilaterally CVS: Regular rate and rhythm, no murmur GI/Abd soft, tender, nondistended, bowel sound present CNS: Alert, awake, oriented x 3 Psychiatry: Mood appropriate Extremities: No pedal edema, no calf tenderness  Data Review: I have personally reviewed the laboratory data and studies available.  F/u labs ordered Unresulted Labs (From admission, onward)     Start     Ordered   05/20/23 0500  Creatinine, serum  (enoxaparin (LOVENOX)  CrCl >/= 30 mL/min  )  Weekly,   R     Comments: while on enoxaparin therapy.    05/13/23 1628   05/15/23 0500  Basic metabolic panel  Daily,   R      05/14/23 0906   05/15/23 0500  CBC with Differential/Platelet  Daily,   R      05/14/23 0906             Total time spent in review of labs and imaging, patient evaluation, formulation of plan, documentation and communication with family: 45 minutes  Signed, Lorin Glass, MD [facility] 05/14/2023

## 2023-05-14 NOTE — Progress Notes (Signed)
Date and time results received: 05/14/23  1010  Test: hemoglobin  Critical Value: 6.1  Name of Provider Notified: Dr. Pola Corn   Orders Received. New orders recieved

## 2023-05-15 DIAGNOSIS — S72141A Displaced intertrochanteric fracture of right femur, initial encounter for closed fracture: Secondary | ICD-10-CM | POA: Diagnosis not present

## 2023-05-15 LAB — BASIC METABOLIC PANEL
Anion gap: 7 (ref 5–15)
BUN: 19 mg/dL (ref 8–23)
CO2: 27 mmol/L (ref 22–32)
Calcium: 8.7 mg/dL — ABNORMAL LOW (ref 8.9–10.3)
Chloride: 100 mmol/L (ref 98–111)
Creatinine, Ser: 0.97 mg/dL (ref 0.44–1.00)
GFR, Estimated: 59 mL/min — ABNORMAL LOW (ref >60)
Glucose, Bld: 173 mg/dL — ABNORMAL HIGH (ref 70–99)
Potassium: 4.5 mmol/L (ref 3.5–5.1)
Sodium: 134 mmol/L — ABNORMAL LOW (ref 135–145)

## 2023-05-15 LAB — TYPE AND SCREEN
ABO/RH(D): A NEG
Antibody Screen: NEGATIVE
Unit division: 0

## 2023-05-15 LAB — CBC WITH DIFFERENTIAL/PLATELET
Abs Immature Granulocytes: 0.1 10*3/uL — ABNORMAL HIGH (ref 0.00–0.07)
Basophils Absolute: 0.1 10*3/uL (ref 0.0–0.1)
Basophils Relative: 0 %
Eosinophils Absolute: 0.1 10*3/uL (ref 0.0–0.5)
Eosinophils Relative: 0 %
HCT: 21.6 % — ABNORMAL LOW (ref 36.0–46.0)
Hemoglobin: 7.3 g/dL — ABNORMAL LOW (ref 12.0–15.0)
Immature Granulocytes: 1 %
Lymphocytes Relative: 18 %
Lymphs Abs: 3.2 10*3/uL (ref 0.7–4.0)
MCH: 32.2 pg (ref 26.0–34.0)
MCHC: 33.8 g/dL (ref 30.0–36.0)
MCV: 95.2 fL (ref 80.0–100.0)
Monocytes Absolute: 1.8 10*3/uL — ABNORMAL HIGH (ref 0.1–1.0)
Monocytes Relative: 10 %
Neutro Abs: 12.7 10*3/uL — ABNORMAL HIGH (ref 1.7–7.7)
Neutrophils Relative %: 71 %
Platelets: 249 10*3/uL (ref 150–400)
RBC: 2.27 MIL/uL — ABNORMAL LOW (ref 3.87–5.11)
RDW: 15 % (ref 11.5–15.5)
WBC: 18 10*3/uL — ABNORMAL HIGH (ref 4.0–10.5)
nRBC: 0 % (ref 0.0–0.2)

## 2023-05-15 LAB — BPAM RBC
Blood Product Expiration Date: 202409232359
ISSUE DATE / TIME: 202409181108
Unit Type and Rh: 600

## 2023-05-15 LAB — GLUCOSE, CAPILLARY
Glucose-Capillary: 130 mg/dL — ABNORMAL HIGH (ref 70–99)
Glucose-Capillary: 169 mg/dL — ABNORMAL HIGH (ref 70–99)
Glucose-Capillary: 237 mg/dL — ABNORMAL HIGH (ref 70–99)
Glucose-Capillary: 143 mg/dL — ABNORMAL HIGH (ref 70–99)

## 2023-05-15 MED ORDER — TRAMADOL HCL 50 MG PO TABS
50.0000 mg | ORAL_TABLET | Freq: Four times a day (QID) | ORAL | 0 refills | Status: DC | PRN
Start: 2023-05-15 — End: 2023-08-13

## 2023-05-15 MED ORDER — ASPIRIN 81 MG PO TBEC: 81.0000 mg | DELAYED_RELEASE_TABLET | Freq: Two times a day (BID) | ORAL | 0 refills | Status: DC

## 2023-05-15 NOTE — Progress Notes (Signed)
Physical Therapy Treatment Patient Details Name: [name] MRN: [id number] DOB: 1941/08/11 Today's Date: 05/15/2023   History of Present Illness 82 y.o. female admitted 05/12/23 with R intertrochanteric femur fx, s/p IM nail 05/13/23. PMH: IDDM, HH, migraine, R SI fusion 2016.    PT Comments  Pt ambulated 10' with RW with min A to manage RW and ongoing verbal cues for sequencing, distance limited by pain and fatigue. Pt performed RLE exercises with min assist. Good progress with mobility today.     If plan is discharge home, recommend the following: A lot of help with walking and/or transfers;A lot of help with bathing/dressing/bathroom;Assistance with cooking/housework;Assist for transportation;Help with stairs or ramp for entrance   Can travel by private vehicle     No  Equipment Recommendations  None recommended by PT    Recommendations for Other Services       Precautions / Restrictions Precautions Precautions: Fall Precaution Comments: reports 1 other fall in past 6 months Restrictions Weight Bearing Restrictions: No     Mobility  Bed Mobility Overal bed mobility: Needs Assistance Bed Mobility: Supine to Sit     Supine to sit: Max assist     General bed mobility comments: assist to raise trunk, pivot hips, and advance RLE , pt assisted with advancing RLE with gait belt as leg lifter    Transfers Overall transfer level: Needs assistance Equipment used: Rolling walker (2 wheels) Transfers: Sit to/from Stand Sit to Stand: From elevated surface, Min assist           General transfer comment: assist to power up and steady, VCs hand placement    Ambulation/Gait Ambulation/Gait assistance: Min assist Gait Distance (Feet): 10 Feet Assistive device: Rolling walker (2 wheels) Gait Pattern/deviations: Step-to pattern, Decreased step length - right, Decreased step length - left, Antalgic, Decreased weight shift to right Gait velocity: decr     General Gait  Details: ongoing VCs for sequencing, min A to manage RW, no loss of balance, distance limited by pain   Stairs             Wheelchair Mobility     Tilt Bed    Modified Rankin (Stroke Patients Only)       Balance Overall balance assessment: Needs assistance, History of Falls   Sitting balance-Leahy Scale: Fair     Standing balance support: During functional activity, Bilateral upper extremity supported, Reliant on assistive device for balance Standing balance-Leahy Scale: Poor                              Cognition Arousal: Alert Behavior During Therapy: WFL for tasks assessed/performed Overall Cognitive Status: Within Functional Limits for tasks assessed                                          Exercises General Exercises - Lower Extremity Ankle Circles/Pumps: AROM, Both, Supine, 15 reps Short Arc Quad: AAROM, Right, 10 reps, Supine Heel Slides: AAROM, Right, 10 reps, Supine Hip ABduction/ADduction: AAROM, Right, 5 reps, Supine    General Comments        Pertinent Vitals/Pain Pain Assessment Pain Score: 7  Pain Location: R hip with movement Pain Descriptors / Indicators: Sore, Guarding, Grimacing, Operative site guarding Pain Intervention(s): Limited activity within patient's tolerance, Monitored during session, Premedicated before session, Ice applied, Repositioned  Home Living                          Prior Function            PT Goals (current goals can now be found in the care plan section) Acute Rehab PT Goals Patient Stated Goal: walk well enough to go home; agreeable to ST-SNF PT Goal Formulation: With patient/family Time For Goal Achievement: 05/28/23 Potential to Achieve Goals: Good Progress towards PT goals: Progressing toward goals    Frequency    Min 1X/week      PT Plan      Co-evaluation              AM-PAC PT "6 Clicks" Mobility   Outcome Measure  Help needed turning  from your back to your side while in a flat bed without using bedrails?: A Lot Help needed moving from lying on your back to sitting on the side of a flat bed without using bedrails?: A Lot Help needed moving to and from a bed to a chair (including a wheelchair)?: A Lot Help needed standing up from a chair using your arms (e.g., wheelchair or bedside chair)?: A Lot Help needed to walk in hospital room?: A Little Help needed climbing 3-5 steps with a railing? : Total 6 Click Score: 12    End of Session Equipment Utilized During Treatment: Gait belt Activity Tolerance: Patient limited by pain Patient left: in chair;with chair alarm set;with call bell/phone within reach Nurse Communication: Mobility status PT Visit Diagnosis: Muscle weakness (generalized) (M62.81);Difficulty in walking, not elsewhere classified (R26.2);Pain;History of falling (Z91.81);Unsteadiness on feet (R26.81) Pain - Right/Left: Right Pain - part of body: Hip     Time: 6160-7371 PT Time Calculation (min) (ACUTE ONLY): 27 min  Charges:    $Gait Training: 8-22 mins $Therapeutic Exercise: 8-22 mins PT General Charges $$ ACUTE PT VISIT: 1 Visit                     Ralene Bathe Kistler PT 05/15/2023  Acute Rehabilitation Services  Office [phone]

## 2023-05-15 NOTE — [place] FL2 (Signed)
[place] MEDICAID FL2 LEVEL OF CARE FORM     IDENTIFICATION  Patient Name: [name] Birthdate: 10-09-1940 Sex: female Admission Date (Current Location): 05/12/2023  [facility] and [place] Number:  Producer, television/film/video and Address:  [facility],  [address] [place], [place] [address]      Provider Number: [id number]  Attending Physician Name and Address:  Lorin Glass, MD  Relative Name and Phone Number:  Vittoria Wait (spouse) Ph: [phone]    Current Level of Care: Hospital Recommended Level of Care: Skilled Nursing Facility Prior Approval Number:    Date Approved/Denied:   PASRR Number: [id number]  Discharge Plan: SNF    Current Diagnoses: Patient Active Problem List   Diagnosis Date Noted   Closed intertrochanteric fracture of hip, right, initial encounter (HCC) 05/12/2023   Hyponatremia 05/12/2023   Leukocytosis 05/12/2023   Insulin dependent type 2 diabetes mellitus (HCC) 05/12/2023   Migraine    Knee instability, left 11/07/2022   Weight loss 08/08/2022   Hot flashes 12/10/2021   HTN (hypertension) 07/11/2021   Epigastric pain 06/21/2021   CRI (chronic renal insufficiency), stage 3 (moderate) (HCC) 06/21/2021   Aphthous ulcer of mouth 02/07/2021   Memory problem 05/23/2020   History of total abdominal hysterectomy and bilateral salpingo-oophorectomy 05/15/2020   Tremor 02/15/2020   Vertigo 11/23/2018   Osteoporosis 07/15/2018   Lumbar radiculopathy 03/23/2018   Diabetic peripheral neuropathy associated with type 2 diabetes mellitus (HCC) 04/10/2017   Chronic nausea 03/10/2017   Hyperlipidemia associated with type 2 diabetes mellitus (HCC) 10/22/2016   IDA (iron deficiency anemia) 10/16/2016   Normocytic anemia 09/04/2016   Intractable chronic migraine without aura and with status migrainosus 08/12/2016   CAP (community acquired pneumonia) 05/28/2016   Acute confusional state 05/16/2016   Chronic migraine w/o aura w/o status  migrainosus, not intractable 04/13/2016   OSA on CPAP 01/30/2016   Acute URI 10/17/2015   Rash 07/12/2015   SI (sacroiliac) pain 03/30/2015   Sciatica of right side 08/08/2014   Acute blood loss anemia 03/09/2014   DJD (degenerative joint disease) of knee 11/10/2013   Constipation 10/29/2013   Obesity 08/04/2013   Osteoarthritis of right knee 08/04/2013   Edema 05/11/2013   Fall 09/16/2012   Neck pain 09/16/2012   Low back pain 09/16/2012   Cerumen impaction 08/07/2011   Acute bronchitis 05/21/2011   Fatigue 08/07/2010   PARESTHESIA 08/07/2010   DYSPHAGIA 07/13/2009   Irritable bowel syndrome 09/24/2007   Elevated liver function tests 09/24/2007   DM type 2, controlled, with complication (HCC) 06/26/2007   Anxiety state 06/26/2007   Depression with anxiety 06/26/2007   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 heart failure 06/26/2007   GERD 06/26/2007   Osteoarthritis 06/26/2007   COLONIC POLYPS, HX OF 06/26/2007   SPLENECTOMY, TOTAL, HX OF 06/26/2007    Orientation RESPIRATION BLADDER Height & Weight     Self, Time, Situation, Place  Normal Continent Weight:   Height:     BEHAVIORAL SYMPTOMS/MOOD NEUROLOGICAL BOWEL NUTRITION STATUS   (N/A)  (N/A) Continent Diet (Heart healthy/carb modified)  AMBULATORY STATUS COMMUNICATION OF NEEDS Skin   Extensive Assist Verbally Surgical wounds, Other (Comment) (Ecchymosis: bilateral arms)                       Personal Care Assistance Level of Assistance  Bathing, Feeding, Dressing Bathing Assistance: Limited assistance Feeding assistance: Independent Dressing Assistance: Limited assistance     Functional Limitations Info  Sight, Hearing, Speech  Sight Info: Adequate Hearing Info: Adequate Speech Info: Adequate    SPECIAL CARE FACTORS FREQUENCY  PT (By licensed PT), OT (By licensed OT)     PT Frequency: 5x's/week OT Frequency: 5x's/week            Contractures Contractures Info: Not present     Additional Factors Info  Code Status, Allergies, Psychotropic, Insulin Sliding Scale Code Status Info: Full Allergies Info: Amlodipine, Celebrex (Celecoxib), Topamax (Topiramate), Amitiza (Lubiprostone), Carafate (Sucralfate), Jardiance (Empagliflozin), Lactulose, Codeine, Gabapentin, Hydrocodone, Meloxicam, Pravastatin Sodium Psychotropic Info: See discharge summary Insulin Sliding Scale Info: See discharge summary       Current Medications (05/15/2023):  This is the current hospital active medication list Current Facility-Administered Medications  Medication Dose Route Frequency Provider Last Rate Last Admin   acetaminophen (TYLENOL) tablet 325-650 mg  325-650 mg Oral Q6H PRN Gawne, Meghan M, PA-C       alum & mag hydroxide-simeth (MAALOX/MYLANTA) 200-200-20 MG/5ML suspension 30 mL  30 mL Oral Q4H PRN Levester Fresh M, PA-C   30 mL at 05/14/23 1709   bisacodyl (DULCOLAX) suppository 10 mg  10 mg Rectal Daily PRN Gawne, Meghan M, PA-C       busPIRone (BUSPAR) tablet 15 mg  15 mg Oral BID Levester Fresh M, PA-C   15 mg at 05/15/23 0850   cyclobenzaprine (FLEXERIL) tablet 5 mg  5 mg Oral TID PRN Jenne Pane, PA-C   5 mg at 05/14/23 1709   docusate sodium (COLACE) capsule 100 mg  100 mg Oral BID Levester Fresh M, PA-C   100 mg at 05/15/23 0850   enoxaparin (LOVENOX) injection 40 mg  40 mg Subcutaneous Q24H Levester Fresh M, PA-C   40 mg at 05/15/23 0850   HYDROmorphone (DILAUDID) injection 0.5-1 mg  0.5-1 mg Intravenous Q4H PRN Gawne, Meghan M, PA-C       insulin aspart (novoLOG) injection 0-5 Units  0-5 Units Subcutaneous QHS Luiz Iron, NP   3 Units at 05/13/23 2244   insulin aspart (novoLOG) injection 0-9 Units  0-9 Units Subcutaneous TID WC Luiz Iron, NP   1 Units at 05/15/23 0851   LORazepam (ATIVAN) tablet 1 mg  1 mg Oral QID PRN Levester Fresh M, PA-C   1 mg at 05/14/23 2211   menthol-cetylpyridinium (CEPACOL) lozenge 3 mg  1 lozenge Oral PRN Levester Fresh M, PA-C        Or   phenol (CHLORASEPTIC) mouth spray 1 spray  1 spray Mouth/Throat PRN Gawne, Meghan M, PA-C       metoCLOPramide (REGLAN) tablet 5-10 mg  5-10 mg Oral Q8H PRN Gawne, Meghan M, PA-C       Or   metoCLOPramide (REGLAN) injection 5-10 mg  5-10 mg Intravenous Q8H PRN Gawne, Meghan M, PA-C       ondansetron (ZOFRAN) tablet 4 mg  4 mg Oral Q6H PRN Gawne, Meghan M, PA-C       Or   ondansetron [facility]) injection 4 mg  4 mg Intravenous Q6H PRN Gawne, Meghan M, PA-C       oxyCODONE (Oxy IR/ROXICODONE) immediate release tablet 10 mg  10 mg Oral Q4H PRN Gawne, Meghan M, PA-C       oxyCODONE (Oxy IR/ROXICODONE) immediate release tablet 5 mg  5 mg Oral Q4H PRN Levester Fresh M, PA-C   5 mg at 05/14/23 0816   pantoprazole (PROTONIX) EC tablet 40 mg  40 mg Oral BID Gawne, Meghan M, PA-C   40 mg at  05/15/23 0850   senna-docusate (Senokot-S) tablet 1 tablet  1 tablet Oral QHS PRN Levester Fresh M, PA-C       SUMAtriptan (IMITREX) nasal spray 20 mg  20 mg Nasal Q2H PRN Gawne, Lindie Spruce M, PA-C       traMADol (ULTRAM) tablet 50 mg  50 mg Oral Q4H PRN Levester Fresh M, PA-C   50 mg at 05/15/23 1008   vortioxetine HBr (TRINTELLIX) tablet 20 mg  20 mg Oral Daily Levester Fresh M, PA-C   20 mg at 05/15/23 1610     Discharge Medications: Please see discharge summary for a list of discharge medications.  Relevant Imaging Results:  Relevant Lab Results:   Additional Information SSN: [id number]  Ewing Schlein, LCSW

## 2023-05-15 NOTE — Progress Notes (Signed)
Subjective: Patient reports pain as moderate. Tolerating diet.  Urinating.   No CP, SOB.  Has mobilized small amount OOB with PT but limited by pain. Low Hgb so needed a blood transfusion yesterday. Feels better today.   Objective:   VITALS:   Vitals:   05/14/23 1332 05/14/23 1413 05/14/23 2208 05/15/23 0508  BP: (!) 141/60 (!) 143/64 (!) 153/65 (!) 155/76  Pulse: (!) 104 (!) 105 89 86  Resp: 19 16 14 14   Temp: 98.2 F (36.8 C) 98.6 F (37 C) 98.9 F (37.2 C) 98.4 F (36.9 C)  TempSrc:  Oral Oral   SpO2: 100% 100% 98% 100%      Latest Ref Rng & Units 05/15/2023    3:01 AM 05/14/2023    9:38 AM 05/13/2023    4:51 PM  CBC  WBC 4.0 - 10.5 K/uL 18.0  14.8  11.7   Hemoglobin 12.0 - 15.0 g/dL 7.3  6.1  7.3   Hematocrit 36.0 - 46.0 % 21.6  18.6  22.5   Platelets 150 - 400 K/uL 249  259  275       Latest Ref Rng & Units 05/15/2023    3:01 AM 05/13/2023    4:51 PM 05/13/2023    2:57 AM  BMP  Glucose 70 - 99 mg/dL 191   478   BUN 8 - 23 mg/dL 19   22   Creatinine 2.95 - 1.00 mg/dL 6.21  3.08  6.57   Sodium 135 - 145 mmol/L 134   131   Potassium 3.5 - 5.1 mmol/L 4.5   4.7   Chloride 98 - 111 mmol/L 100   97   CO2 22 - 32 mmol/L 27   27   Calcium 8.9 - 10.3 mg/dL 8.7   9.1    Intake/Output      09/18 0701 09/19 0700 09/19 0701 09/20 0700   P.O. 720    I.V.     Blood 346    IV Piggyback     Total Intake 1066    Urine 2250 350   Blood     Total Output 2250 350   Net -1184 -350           Physical Exam: General: NAD.  Sitting up in bed, calm, comfortable  Resp: No increased wob Cardio: regular rate and rhythm ABD soft Neurologically intact MSK Neurovascularly intact Sensation intact distally Intact pulses distally Dorsiflexion/Plantar flexion intact Incision: dressing C/D/I Ice to thigh  Assessment: 2 Days Post-Op  S/P Procedure(s) (LRB): INTRAMEDULLARY (IM) NAIL FEMORAL (Right) by Dr. Jewel Baize. Eulah Pont on 05/13/23  Principal Problem:   Closed  intertrochanteric fracture of hip, right, initial encounter [facility]) Active Problems:   Depression with anxiety   Normocytic anemia   Hyponatremia   Leukocytosis   Insulin dependent type 2 diabetes mellitus (HCC)   Plan: Continue to monitor pressure dressing on proximal incision due to hematoma that formed at end of surgery   Advance diet Up with therapy Incentive Spirometry Elevate and Apply ice  Weightbearing: WBAT RLE Insicional and dressing care: Dressings left intact until follow-up and Reinforce dressings as needed Orthopedic device(s): None Showering: Keep dressing dry VTE prophylaxis: Lovenox 40mg  qd  while in patient, can switch to ASA 81mg  bid x 30 days upon d/c , SCDs, ambulation Pain control: PRN Follow - up plan: 2 weeks post op in the office Contact information:  Margarita Rana MD, Levester Fresh PA-C  Dispo:  PT recommending SNF and  patient agreeable. Waiting on bed placement. Ok to discharge from ortho standpoint once medically ready. Will print and sign scripts for ASA and Tramadol.     Jenne Pane, PA-C Office [phone] 05/15/2023, 8:59 AM

## 2023-05-15 NOTE — Progress Notes (Signed)
PROGRESS NOTE  [name]  DOB: [birth year]  PCP: Tresa Garter, MD ZOX:[id number]  DOA: 05/12/2023  LOS: 3 days  Hospital Day: 4  Brief narrative: [name] is a 82 y.o. female with PMH significant for DM2, HLD, sleep apnea, GERD, hiatal hernia, migraine, depression 9/16, patient was brought to the ED by EMS after a fall.  Patient reportedly was trying to change her pants when her legs got caught in her pants, lost her balance and she fell landing on her right side sustaining immediate pain.  She did not hit her head or passed out. She lives at home with her husband and normally able to ambulate without using any assistive device  In the ED, patient was afebrile, blood pressure was elevated 160/63 Labs with WC count 12.2, hemoglobin 10, sodium 130 Right hip x-ray showed a right intertrochanteric hip fracture with displaced lesser trochanter.  Patient was given IV Dilaudid for pain.  EDP spoke with orthopedics Dr. Eulah Pont who recommended medical admission with tentative plans for surgical fixation 9/17 AM.   Admitted to [facility]  Subjective: Patient was seen and examined this morning.  Lying on bed.  Not in distress.  No new symptoms.  Labs this morning with hemoglobin improved to 7.3.  Assessment and plan: Acute right intertrochanteric hip fracture S/p ORIF 9/17  Secondary to a mechanical fall.  S/p surgery Continue analgesics as needed Pain controlled. DVT prophylaxis per surgery-currently on Lovenox subcu.  At discharge, orthopedics recommended aspirin 81 mg twice daily for 30 days.  Acute blood loss anemia POA mild chronic anemia Hemoglobin at baseline slightly low above 11. At presentation, hemoglobin was low at 10 likely because of fracture.  With surgical blood loss, hemoglobin dipped down to 6.1 on 9/18.  1 and a PRBC transfusion given.  Hemoglobin improved to 7.3 this morning.  Continue to monitor. Recent Labs    05/12/23 1929 05/13/23 0257 05/13/23 1651  05/14/23 0938 05/15/23 0301  HGB 10.0* 9.3* 7.3* 6.1* 7.3*  MCV 96.8 97.6 100.4* 98.4 95.2   Hyponatremia Mild.  Improving with IV fluid. Recent Labs  Lab 05/12/23 1929 05/13/23 0257 05/15/23 0301  NA 130* 131* 134*    Leukocytosis Mild and likely reactive. Improved without antibiotics Recent Labs  Lab 05/12/23 1929 05/13/23 0257 05/13/23 1651 05/14/23 0938 05/15/23 0301  WBC 12.2* 10.4 11.7* 14.8* 18.0*   Type 2 diabetes mellitus with hyperglycemia A1c 7 from May 2024 PTA meds-Prandin 3 times daily Currently on SSI/Accu-Cheks Recent Labs  Lab 05/14/23 1117 05/14/23 1651 05/14/23 2209 05/15/23 0814 05/15/23 1153  GLUCAP 211* 202* 161* 130* 169*   Hypertension Blood pressure elevated 140s to 160s.  Partly because of pain.   PTA meds-none Continue to monitor.  IV hydralazine as needed  GERD/hiatal hernia Continue Protonix twice daily as before  Depression/anxiety: Continue BuSpar, Trintellix, home Ativan.  Migraine On Nurtec PRN   Mobility: Needs PT eval postprocedure  Goals of care   Code Status: Full Code     DVT prophylaxis:  enoxaparin (LOVENOX) injection 40 mg Start: 05/14/23 0800 SCDs Start: 05/13/23 1629 SCDs Start: 05/12/23 2220   Antimicrobials: None Fluid: Stop IV fluid today Consultants: Orthopedics Family Communication: son at bedside  Status: Inpatient Level of care:  Med-Surg   Patient is from: Home Needs to continue in-hospital care: POD1 Anticipated d/c to: PT eval obtained.  SNF recommended.  If hemoglobin tomorrow is stable, can be medically cleared for discharge    Diet:  Diet Order  Diet heart healthy/carb modified Room service appropriate? Yes; Fluid consistency: Thin  Diet effective now                   Scheduled Meds:  busPIRone  15 mg Oral BID   docusate sodium  100 mg Oral BID   enoxaparin (LOVENOX) injection  40 mg Subcutaneous Q24H   insulin aspart  0-5 Units Subcutaneous QHS    insulin aspart  0-9 Units Subcutaneous TID WC   pantoprazole  40 mg Oral BID   vortioxetine HBr  20 mg Oral Daily    PRN meds: acetaminophen, alum & mag hydroxide-simeth, bisacodyl, cyclobenzaprine, HYDROmorphone (DILAUDID) injection, LORazepam, menthol-cetylpyridinium **OR** phenol, metoCLOPramide **OR** metoCLOPramide (REGLAN) injection, ondansetron **OR** ondansetron (ZOFRAN) IV, oxyCODONE, oxyCODONE, senna-docusate, SUMAtriptan, traMADol   Infusions:     Antimicrobials: Anti-infectives (From admission, onward)    Start     Dose/Rate Route Frequency Ordered Stop   05/13/23 1900  ceFAZolin (ANCEF) IVPB 2g/100 mL premix        2 g 200 mL/hr over 30 Minutes Intravenous Every 6 hours 05/13/23 1628 05/14/23 0041   05/13/23 1230  ceFAZolin (ANCEF) IVPB 2g/100 mL premix        2 g 200 mL/hr over 30 Minutes Intravenous On call to O.R. 05/13/23 1013 05/13/23 1346       Objective: Vitals:   05/15/23 0508 05/15/23 1441  BP: (!) 155/76 138/61  Pulse: 86 (!) 103  Resp: 14 16  Temp: 98.4 F (36.9 C) 97.9 F (36.6 C)  SpO2: 100% 99%    Intake/Output Summary (Last 24 hours) at 05/15/2023 1610 Last data filed at 05/15/2023 1140 Gross per 24 hour  Intake 240 ml  Output 1900 ml  Net -1660 ml   There were no vitals filed for this visit. Weight change:  There is no height or weight on file to calculate BMI.   Physical Exam: General exam: Pleasant.  Pain controlled. Skin: No rashes, lesions or ulcers. HEENT: Atraumatic, normocephalic, no obvious bleeding Lungs: Clear to auscultation bilaterally CVS: Regular rate and rhythm, no murmur GI/Abd soft, tender, nondistended, bowel sound present CNS: Alert, awake, oriented x 3 Psychiatry: Mood appropriate Extremities: No pedal edema, no calf tenderness  Data Review: I have personally reviewed the laboratory data and studies available.  F/u labs ordered Unresulted Labs (From admission, onward)     Start     Ordered   05/20/23 0500   Creatinine, serum  (enoxaparin (LOVENOX)  CrCl >/= 30 mL/min  )  Weekly,   R     Comments: while on enoxaparin therapy.    05/13/23 1628   05/15/23 0500  Basic metabolic panel  Daily,   R      05/14/23 0906   05/15/23 0500  CBC with Differential/Platelet  Daily,   R      05/14/23 0906            Total time spent in review of labs and imaging, patient evaluation, formulation of plan, documentation and communication with family: 45 minutes  Signed, Lorin Glass, MD [facility] 05/15/2023

## 2023-05-15 NOTE — Op Note (Signed)
DATE OF SURGERY:  05/15/2023  TIME: 7:05 AM  PATIENT NAME:  [name]  AGE: 82 y.o.  PRE-OPERATIVE DIAGNOSIS:  right hip fracture  POST-OPERATIVE DIAGNOSIS:  SAME  PROCEDURE:  INTRAMEDULLARY (IM) NAIL FEMORAL  SURGEON:  Sheral Apley  ASSISTANT:  Levester Fresh, PA-C, she was present and scrubbed throughout the case, critical for completion in a timely fashion, and for retraction, instrumentation, and closure.   OPERATIVE IMPLANTS: [employer] Gamma Nail  PREOPERATIVE INDICATIONS:  [name] is a 82 y.o. year old who fell and suffered a hip fracture. She was brought into the ER and then admitted and optimized and then elected for surgical intervention.    The risks benefits and alternatives were discussed with the patient including but not limited to the risks of nonoperative treatment, versus surgical intervention including infection, bleeding, nerve injury, malunion, nonunion, hardware prominence, hardware failure, need for hardware removal, blood clots, cardiopulmonary complications, morbidity, mortality, among others, and they were willing to proceed.    OPERATIVE PROCEDURE:  The patient was brought to the operating room and placed in the supine position. General anesthesia was administered. She was placed on the fracture table.  Closed reduction was performed under C-arm guidance. Time out was then performed after sterile prep and drape. She received preoperative antibiotics.  I first performed a closed manipulation of the fracture to reduce it on the OR table.  Incision was made proximal to the greater trochanter. A guidewire was placed in the appropriate position. Confirmation was made on AP and lateral views. The above-named nail was opened. I opened the proximal femur with a reamer. I then placed the nail by hand easily down. I did not need to ream the femur.  Once the nail was completely seated, I placed a guidepin into the femoral head into the center center  position. I measured the length, and then reamed the lateral cortex and up into the head. I then placed the lag screw. Slight compression was applied. Anatomic fixation achieved.  I then secured the proximal interlocking bolt, and took off a half a turn, and then removed the instruments, and took final C-arm pictures AP and lateral the entire length of the leg.  I then placed a distal interlock screw.   Anatomic reconstruction was achieved, and the wounds were irrigated copiously and closed with a layered closure. The patient was awakened and returned to PACU in stable and satisfactory condition. There no complications and the patient tolerated the procedure well.  She will be weightbearing as tolerated, and will be on chemical px  for a period of four weeks after discharge.   Margarita Rana, M.D.

## 2023-05-15 NOTE — TOC Initial Note (Signed)
Transition of Care [facility]) - Initial/Assessment Note   Patient Details  Name: [name] MRN: [id number] Date of Birth: 07/10/1941  Transition of Care [facility]) CM/SW Contact:    Ewing Schlein, LCSW Phone Number: 05/15/2023, 10:45 AM  Clinical Narrative: PT evaluation recommended SNF. Patient and son, [place], agreeable to rehab. Son reported family would prefer [facility], [facility], or [facility] if possible.  FL2 done; PASRR confirmed. Initial referral faxed out. CSW called [employer] to start insurance authorization. TOC awaiting bed offers and insurance approval.  Expected Discharge Plan: Skilled Nursing Facility Barriers to Discharge: English as a second language teacher, SNF Pending bed offer, Continued Medical Work up  Patient Goals and CMS Choice Patient states their goals for this hospitalization and ongoing recovery are:: Go to short-term rehab CMS Medicare.gov Compare Post Acute Care list provided to:: Patient Choice offered to / list presented to : Patient, Adult Children  Expected Discharge Plan and Services In-house Referral: Clinical Social Work Post Acute Care Choice: Skilled Nursing Facility Living arrangements for the past 2 months: Single Family Home           DME Arranged: N/A DME Agency: NA  Prior Living Arrangements/Services Living arrangements for the past 2 months: Single Family Home Lives with:: Spouse Patient language and need for interpreter reviewed:: Yes Do you feel safe going back to the place where you live?: Yes      Need for Family Participation in Patient Care: No (Comment) Care giver support system in place?: Yes (comment) Criminal Activity/Legal Involvement Pertinent to Current Situation/Hospitalization: No - Comment as needed  Activities of Daily Living Home Assistive Devices/Equipment: None ADL Screening (condition at time of admission) Patient's cognitive ability adequate to safely complete daily activities?: Yes Is the  patient deaf or have difficulty hearing?: No Does the patient have difficulty seeing, even when wearing glasses/contacts?: No Does the patient have difficulty concentrating, remembering, or making decisions?: No Patient able to express need for assistance with ADLs?: Yes Does the patient have difficulty dressing or bathing?: No Independently performs ADLs?: Yes (appropriate for developmental age) Does the patient have difficulty walking or climbing stairs?: No Weakness of Legs: Both Weakness of Arms/Hands: Both  Permission Sought/Granted Permission sought to share information with : Facility Industrial/product designer granted to share information with : Yes, Verbal Permission Granted Permission granted to share info w AGENCY: SNFs  Emotional Assessment Appearance:: Appears stated age Attitude/Demeanor/Rapport: Engaged Affect (typically observed): Accepting Orientation: : Oriented to Self, Oriented to Place, Oriented to  Time, Oriented to Situation Alcohol / Substance Use: Not Applicable Psych Involvement: No (comment)  Admission diagnosis:  Closed displaced intertrochanteric fracture of right femur, initial encounter (HCC) [S72.141A] Closed intertrochanteric fracture of hip, right, initial encounter (HCC) [S72.141A] Patient Active Problem List   Diagnosis Date Noted   Closed intertrochanteric fracture of hip, right, initial encounter (HCC) 05/12/2023   Hyponatremia 05/12/2023   Leukocytosis 05/12/2023   Insulin dependent type 2 diabetes mellitus (HCC) 05/12/2023   Migraine    Knee instability, left 11/07/2022   Weight loss 08/08/2022   Hot flashes 12/10/2021   HTN (hypertension) 07/11/2021   Epigastric pain 06/21/2021   CRI (chronic renal insufficiency), stage 3 (moderate) (HCC) 06/21/2021   Aphthous ulcer of mouth 02/07/2021   Memory problem 05/23/2020   History of total abdominal hysterectomy and bilateral salpingo-oophorectomy 05/15/2020   Tremor 02/15/2020    Vertigo 11/23/2018   Osteoporosis 07/15/2018   Lumbar radiculopathy 03/23/2018   Diabetic peripheral neuropathy associated with type 2 diabetes  mellitus (HCC) 04/10/2017   Chronic nausea 03/10/2017   Hyperlipidemia associated with type 2 diabetes mellitus (HCC) 10/22/2016   IDA (iron deficiency anemia) 10/16/2016   Normocytic anemia 09/04/2016   Intractable chronic migraine without aura and with status migrainosus 08/12/2016   CAP (community acquired pneumonia) 05/28/2016   Acute confusional state 05/16/2016   Chronic migraine w/o aura w/o status migrainosus, not intractable 04/13/2016   OSA on CPAP 01/30/2016   Acute URI 10/17/2015   Rash 07/12/2015   SI (sacroiliac) pain 03/30/2015   Sciatica of right side 08/08/2014   Acute blood loss anemia 03/09/2014   DJD (degenerative joint disease) of knee 11/10/2013   Constipation 10/29/2013   Obesity 08/04/2013   Osteoarthritis of right knee 08/04/2013   Edema 05/11/2013   Fall 09/16/2012   Neck pain 09/16/2012   Low back pain 09/16/2012   Cerumen impaction 08/07/2011   Acute bronchitis 05/21/2011   Fatigue 08/07/2010   PARESTHESIA 08/07/2010   DYSPHAGIA 07/13/2009   Irritable bowel syndrome 09/24/2007   Elevated liver function tests 09/24/2007   DM type 2, controlled, with complication (HCC) 06/26/2007   Anxiety state 06/26/2007   Depression with anxiety 06/26/2007   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 heart failure 06/26/2007   GERD 06/26/2007   Osteoarthritis 06/26/2007   COLONIC POLYPS, HX OF 06/26/2007   SPLENECTOMY, TOTAL, HX OF 06/26/2007   PCP:  Tresa Garter, MD Pharmacy:   [id number] - [place], [place] - [address] [place] [address] Phone: [phone] Fax: [phone]  Social Determinants of Health (SDOH) Social History: SDOH Screenings   Food Insecurity: No Food Insecurity (05/13/2023)  Housing: Low Risk  (05/13/2023)  Transportation Needs: No  Transportation Needs (05/13/2023)  Utilities: Not At Risk (05/13/2023)  Alcohol Screen: Low Risk  (04/24/2022)  Depression (PHQ2-9): Low Risk  (05/12/2023)  Financial Resource Strain: Low Risk  (04/24/2022)  Physical Activity: Inactive (04/24/2022)  Social Connections: Socially Integrated (04/24/2022)  Stress: No Stress Concern Present (04/24/2022)  Tobacco Use: Low Risk  (05/13/2023)   SDOH Interventions:    Readmission Risk Interventions     No data to display

## 2023-05-15 NOTE — Plan of Care (Signed)
Problem: Education: Goal: Ability to describe self-care measures that may prevent or decrease complications (Diabetes Survival Skills Education) will improve Outcome: Progressing   Problem: Fluid Volume: Goal: Ability to maintain a balanced intake and output will improve Outcome: Progressing   Problem: Skin Integrity: Goal: Risk for impaired skin integrity will decrease Outcome: Progressing

## 2023-05-16 DIAGNOSIS — F32A Depression, unspecified: Secondary | ICD-10-CM | POA: Diagnosis not present

## 2023-05-16 DIAGNOSIS — E785 Hyperlipidemia, unspecified: Secondary | ICD-10-CM | POA: Diagnosis not present

## 2023-05-16 DIAGNOSIS — F419 Anxiety disorder, unspecified: Secondary | ICD-10-CM | POA: Diagnosis not present

## 2023-05-16 DIAGNOSIS — E119 Type 2 diabetes mellitus without complications: Secondary | ICD-10-CM | POA: Diagnosis not present

## 2023-05-16 DIAGNOSIS — Q782 Osteopetrosis: Secondary | ICD-10-CM | POA: Diagnosis not present

## 2023-05-16 DIAGNOSIS — I10 Essential (primary) hypertension: Secondary | ICD-10-CM | POA: Diagnosis not present

## 2023-05-16 DIAGNOSIS — M6281 Muscle weakness (generalized): Secondary | ICD-10-CM | POA: Diagnosis not present

## 2023-05-16 DIAGNOSIS — R2689 Other abnormalities of gait and mobility: Secondary | ICD-10-CM | POA: Diagnosis not present

## 2023-05-16 DIAGNOSIS — E871 Hypo-osmolality and hyponatremia: Secondary | ICD-10-CM | POA: Diagnosis not present

## 2023-05-16 DIAGNOSIS — D500 Iron deficiency anemia secondary to blood loss (chronic): Secondary | ICD-10-CM | POA: Diagnosis not present

## 2023-05-16 DIAGNOSIS — K219 Gastro-esophageal reflux disease without esophagitis: Secondary | ICD-10-CM | POA: Diagnosis not present

## 2023-05-16 DIAGNOSIS — S72141D Displaced intertrochanteric fracture of right femur, subsequent encounter for closed fracture with routine healing: Secondary | ICD-10-CM | POA: Diagnosis not present

## 2023-05-16 DIAGNOSIS — S7221XD Displaced subtrochanteric fracture of right femur, subsequent encounter for closed fracture with routine healing: Secondary | ICD-10-CM | POA: Diagnosis not present

## 2023-05-16 DIAGNOSIS — S72141A Displaced intertrochanteric fracture of right femur, initial encounter for closed fracture: Secondary | ICD-10-CM | POA: Diagnosis not present

## 2023-05-16 LAB — GLUCOSE, CAPILLARY
Glucose-Capillary: 151 mg/dL — ABNORMAL HIGH (ref 70–99)
Glucose-Capillary: 140 mg/dL — ABNORMAL HIGH (ref 70–99)
Glucose-Capillary: 153 mg/dL — ABNORMAL HIGH (ref 70–99)

## 2023-05-16 LAB — CBC WITH DIFFERENTIAL/PLATELET
Abs Immature Granulocytes: 0.11 10*3/uL — ABNORMAL HIGH (ref 0.00–0.07)
Basophils Absolute: 0.2 10*3/uL — ABNORMAL HIGH (ref 0.0–0.1)
Basophils Relative: 1 %
Eosinophils Absolute: 0.3 10*3/uL (ref 0.0–0.5)
Eosinophils Relative: 2 %
HCT: 22.7 % — ABNORMAL LOW (ref 36.0–46.0)
Hemoglobin: 7.5 g/dL — ABNORMAL LOW (ref 12.0–15.0)
Immature Granulocytes: 1 %
Lymphocytes Relative: 21 %
Lymphs Abs: 3.4 10*3/uL (ref 0.7–4.0)
MCH: 31.6 pg (ref 26.0–34.0)
MCHC: 33 g/dL (ref 30.0–36.0)
MCV: 95.8 fL (ref 80.0–100.0)
Monocytes Absolute: 1.9 10*3/uL — ABNORMAL HIGH (ref 0.1–1.0)
Monocytes Relative: 12 %
Neutro Abs: 10.3 10*3/uL — ABNORMAL HIGH (ref 1.7–7.7)
Neutrophils Relative %: 63 %
Platelets: 300 10*3/uL (ref 150–400)
RBC: 2.37 MIL/uL — ABNORMAL LOW (ref 3.87–5.11)
RDW: 14.8 % (ref 11.5–15.5)
WBC: 16.1 10*3/uL — ABNORMAL HIGH (ref 4.0–10.5)
nRBC: 0 % (ref 0.0–0.2)

## 2023-05-16 LAB — BASIC METABOLIC PANEL
Anion gap: 6 (ref 5–15)
BUN: 19 mg/dL (ref 8–23)
CO2: 28 mmol/L (ref 22–32)
Calcium: 8.4 mg/dL — ABNORMAL LOW (ref 8.9–10.3)
Chloride: 96 mmol/L — ABNORMAL LOW (ref 98–111)
Creatinine, Ser: 0.89 mg/dL (ref 0.44–1.00)
GFR, Estimated: >60 mL/min (ref >60)
Glucose, Bld: 174 mg/dL — ABNORMAL HIGH (ref 70–99)
Potassium: 4.2 mmol/L (ref 3.5–5.1)
Sodium: 130 mmol/L — ABNORMAL LOW (ref 135–145)

## 2023-05-16 MED ORDER — LORAZEPAM 1 MG PO TABS: 1.0000 mg | ORAL_TABLET | Freq: Four times a day (QID) | ORAL | 0 refills | Status: AC | PRN

## 2023-05-16 MED ORDER — INSULIN ASPART 100 UNIT/ML IJ SOLN: 0.0000 [IU] | Freq: Every day | INTRAMUSCULAR | Status: DC

## 2023-05-16 MED ORDER — LOSARTAN POTASSIUM 25 MG PO TABS
25.0000 mg | ORAL_TABLET | Freq: Every day | ORAL | Status: DC
  Administered 2023-05-16: 25 mg via ORAL

## 2023-05-16 MED ORDER — ASPIRIN EC 81 MG PO TBEC: 81.0000 mg | DELAYED_RELEASE_TABLET | Freq: Every day | ORAL | Status: AC

## 2023-05-16 MED ORDER — DOCUSATE SODIUM 100 MG PO CAPS: 100.0000 mg | ORAL_CAPSULE | Freq: Two times a day (BID) | ORAL | Status: AC

## 2023-05-16 MED ORDER — LOSARTAN POTASSIUM 25 MG PO TABS: 25.0000 mg | ORAL_TABLET | Freq: Every day | ORAL | Status: DC

## 2023-05-16 MED ORDER — INSULIN ASPART 100 UNIT/ML IJ SOLN: 0.0000 [IU] | Freq: Three times a day (TID) | INTRAMUSCULAR | Status: DC

## 2023-05-16 NOTE — Care Management Important Message (Signed)
Important Message  Patient Details IM Letter given Name: [name] MRN: [id number] Date of Birth: Mar 01, 1941   Medicare Important Message Given:  Yes     Caren Macadam 05/16/2023, 2:53 PM

## 2023-05-16 NOTE — Plan of Care (Signed)
Problem: Coping: Goal: Ability to adjust to condition or change in health will improve Outcome: Progressing   Problem: Pain Management: Goal: Pain level will decrease Outcome: Progressing

## 2023-05-16 NOTE — Plan of Care (Signed)
Problem: Education: Goal: Ability to describe self-care measures that may prevent or decrease complications (Diabetes Survival Skills Education) will improve Outcome: Adequate for Discharge Goal: Individualized Educational Video(s) Outcome: Adequate for Discharge   Problem: Coping: Goal: Ability to adjust to condition or change in health will improve Outcome: Adequate for Discharge   Problem: Fluid Volume: Goal: Ability to maintain a balanced intake and output will improve Outcome: Adequate for Discharge   Problem: Health Behavior/Discharge Planning: Goal: Ability to identify and utilize available resources and services will improve Outcome: Adequate for Discharge Goal: Ability to manage health-related needs will improve Outcome: Adequate for Discharge   Problem: Metabolic: Goal: Ability to maintain appropriate glucose levels will improve Outcome: Adequate for Discharge   Problem: Nutritional: Goal: Maintenance of adequate nutrition will improve Outcome: Adequate for Discharge Goal: Progress toward achieving an optimal weight will improve Outcome: Adequate for Discharge   Problem: Skin Integrity: Goal: Risk for impaired skin integrity will decrease Outcome: Adequate for Discharge   Problem: Tissue Perfusion: Goal: Adequacy of tissue perfusion will improve Outcome: Adequate for Discharge   Problem: Education: Goal: Knowledge of General Education information will improve Description: Including pain rating scale, medication(s)/side effects and non-pharmacologic comfort measures Outcome: Adequate for Discharge   Problem: Health Behavior/Discharge Planning: Goal: Ability to manage health-related needs will improve Outcome: Adequate for Discharge   Problem: Clinical Measurements: Goal: Ability to maintain clinical measurements within normal limits will improve Outcome: Adequate for Discharge Goal: Will remain free from infection Outcome: Adequate for Discharge Goal:  Diagnostic test results will improve Outcome: Adequate for Discharge Goal: Respiratory complications will improve Outcome: Adequate for Discharge Goal: Cardiovascular complication will be avoided Outcome: Adequate for Discharge   Problem: Activity: Goal: Risk for activity intolerance will decrease Outcome: Adequate for Discharge   Problem: Nutrition: Goal: Adequate nutrition will be maintained Outcome: Adequate for Discharge   Problem: Coping: Goal: Level of anxiety will decrease Outcome: Adequate for Discharge   Problem: Elimination: Goal: Will not experience complications related to bowel motility Outcome: Adequate for Discharge Goal: Will not experience complications related to urinary retention Outcome: Adequate for Discharge   Problem: Pain Managment: Goal: General experience of comfort will improve Outcome: Adequate for Discharge   Problem: Safety: Goal: Ability to remain free from injury will improve Outcome: Adequate for Discharge   Problem: Skin Integrity: Goal: Risk for impaired skin integrity will decrease Outcome: Adequate for Discharge   Problem: Education: Goal: Verbalization of understanding the information provided (i.e., activity precautions, restrictions, etc) will improve Outcome: Adequate for Discharge Goal: Individualized Educational Video(s) Outcome: Adequate for Discharge   Problem: Activity: Goal: Ability to ambulate and perform ADLs will improve Outcome: Adequate for Discharge   Problem: Clinical Measurements: Goal: Postoperative complications will be avoided or minimized Outcome: Adequate for Discharge   Problem: Self-Concept: Goal: Ability to maintain and perform role responsibilities to the fullest extent possible will improve Outcome: Adequate for Discharge   Problem: Pain Management: Goal: Pain level will decrease Outcome: Adequate for Discharge

## 2023-05-16 NOTE — Discharge Summary (Signed)
Physician Discharge Summary  [name] YQM:[id number] DOB: [birth year] DOA: 05/12/2023  PCP: Tresa Garter, MD  Admit date: 05/12/2023 Discharge date: 05/16/2023  Admitted From: Home Discharge disposition: Skilled nursing facility  Recommendations at discharge:  DVT prophylaxis -orthopedics recommended aspirin 81 mg twice daily for 30 days.  Prior to admission, patient was not aspirin 81 mg daily.  After completion of the DVT prophylaxis, resume aspirin daily as before.   Brief narrative: [name] is a 82 y.o. female with PMH significant for DM2, HLD, sleep apnea, GERD, hiatal hernia, migraine, depression 9/16, patient was brought to the ED by EMS after a fall.  Patient reportedly was trying to change her pants when her legs got caught in her pants, lost her balance and she fell landing on her right side sustaining immediate pain.  She did not hit her head or passed out. She lives at home with her husband and normally able to ambulate without using any assistive device  In the ED, patient was afebrile, blood pressure was elevated 160/63 Labs with WC count 12.2, hemoglobin 10, sodium 130 Right hip x-ray showed a right intertrochanteric hip fracture with displaced lesser trochanter.  Patient was given IV Dilaudid for pain.  EDP spoke with orthopedics Dr. Eulah Pont who recommended medical admission with tentative plans for surgical fixation 9/17 AM.   Admitted to [facility]  Subjective: Patient was seen and examined this morning.  Sitting up in bed.  Not in distress.   Ready for discharge  Hospital course: Acute right intertrochanteric hip fracture S/p ORIF 9/17  Secondary to a mechanical fall.  S/p surgery Continue analgesics as needed Pain controlled. DVT prophylaxis -orthopedics recommended aspirin 81 mg twice daily for 30 days.  Prior to admission, patient was not aspirin 81 mg daily.  After completion of the DVT prophylaxis, resume aspirin daily as before. Seen by PT.   SNF recommended  Acute blood loss anemia POA mild chronic anemia History of GERD/hiatal hernia Hemoglobin at baseline slightly low above 11. At presentation, hemoglobin was low at 10 likely because of fracture.  With surgical blood loss, hemoglobin dipped down to 6.1 on 9/18.  1 unit of PRBC transfusion was given.  Hemoglobin improved to 7.5 this morning.   Continue Protonix as before Recent Labs    05/13/23 0257 05/13/23 1651 05/14/23 0938 05/15/23 0301 05/16/23 0313  HGB 9.3* 7.3* 6.1* 7.3* 7.5*  MCV 97.6 100.4* 98.4 95.2 95.8   Hyponatremia Mild.  Slightly low probably because of fluid retention from anemia. Expected to improve gradually.  Recheck as an outpatient in next 1 to 2 weeks Recent Labs  Lab 05/12/23 1929 05/13/23 0257 05/15/23 0301 05/16/23 0313  NA 130* 131* 134* 130*    Leukocytosis Mild and likely reactive.  Improving without antibiotics Recent Labs  Lab 05/13/23 0257 05/13/23 1651 05/14/23 0938 05/15/23 0301 05/16/23 0313  WBC 10.4 11.7* 14.8* 18.0* 16.1*   Type 2 diabetes mellitus with hyperglycemia A1c 7 from May 2024 PTA meds-Prandin 3 times daily Continue the same.  Can continue SSI/Accu-Cheks Recent Labs  Lab 05/15/23 1153 05/15/23 1702 05/15/23 2149 05/16/23 0503 05/16/23 0759  GLUCAP 169* 237* 143* 151* 140*   Hypertension Blood pressure elevated 140s to 160s.   PTA meds-none With coexisting diabetes, I started the patient on losartan 25 mg daily. Continue to monitor blood pressure as an outpatient.  Depression/anxiety: Continue BuSpar, Trintellix, home Ativan.  Migraine On Nurtec PRN   Goals of care   Code Status:  Full Code   Wounds:  - Incision (Closed) 05/13/23 Hip Right (Active)  Date First Assessed/Time First Assessed: 05/13/23 1400   Location: Hip  Location Orientation: Right    Assessments 05/13/2023  2:56 PM 05/16/2023 12:00 AM  Dressing Type Silicone dressing;Tape dressing Silicone dressing;Foam - Lift dressing  to assess site every shift  Dressing Clean, Dry, Intact Clean, Dry, Intact  Site / Wound Assessment Dressing in place / Unable to assess Dressing in place / Unable to assess  Drainage Amount None Other (Comment)  Treatment Ice applied Ice applied     No associated orders.    Discharge Exam:   Vitals:   05/15/23 0508 05/15/23 1441 05/15/23 2147 05/16/23 0501  BP: (!) 155/76 138/61 (!) 148/72 (!) 163/80  Pulse: 86 (!) 103 90 98  Resp: 14 16 16 17   Temp: 98.4 F (36.9 C) 97.9 F (36.6 C) 98.8 F (37.1 C) 98.3 F (36.8 C)  TempSrc:   Oral Oral  SpO2: 100% 99% 97% 96%    There is no height or weight on file to calculate BMI.   General exam: Pleasant.  Pain controlled. Skin: No rashes, lesions or ulcers. HEENT: Atraumatic, normocephalic, no obvious bleeding Lungs: Clear to auscultation bilaterally CVS: Regular rate and rhythm, no murmur GI/Abd soft, tender, nondistended, bowel sound present CNS: Alert, awake, oriented x 3 Psychiatry: Mood appropriate Extremities: No pedal edema, no calf tenderness  Follow ups:    Contact information for follow-up providers     Sheral Apley, MD. Schedule an appointment as soon as possible for a visit in 2 week(s).   Specialty: Orthopedic Surgery Contact information: [address] [place] [address] [phone]         Plotnikov, Georgina Quint, MD Follow up.   Specialty: Internal Medicine Contact information: [address] [place] [address] [phone]              Contact information for after-discharge care     Destination     [facility] .   Service: Skilled Nursing Contact information: [address] Test Update Address [place] [address] [phone]                     Discharge Instructions:   Discharge Instructions     Call MD for:  difficulty breathing, headache or visual disturbances   Complete by: As directed    Call  MD for:  extreme fatigue   Complete by: As directed    Call MD for:  hives   Complete by: As directed    Call MD for:  persistant dizziness or light-headedness   Complete by: As directed    Call MD for:  persistant nausea and vomiting   Complete by: As directed    Call MD for:  severe uncontrolled pain   Complete by: As directed    Call MD for:  temperature >100.4   Complete by: As directed    Diet Carb Modified   Complete by: As directed    Discharge instructions   Complete by: As directed    Recommendations at discharge:   DVT prophylaxis -orthopedics recommended aspirin 81 mg twice daily for 30 days.  Prior to admission, patient was not aspirin 81 mg daily.  After completion of the DVT prophylaxis, resume aspirin daily as before.   Discharge instructions for diabetes mellitus: Check blood sugar 3 times a day and bedtime at home. If blood sugar running above  200 or less than 70 please call your MD to adjust insulin. If you notice signs and symptoms of hypoglycemia (low blood sugar) like jitteriness, confusion, thirst, tremor and sweating, please check blood sugar, drink sugary drink/biscuits/sweets to increase sugar level and call MD or return to ER.    General discharge instructions: Follow with Primary MD Plotnikov, Georgina Quint, MD in 7 days  Please request your PCP  to go over your hospital tests, procedures, radiology results at the follow up. Please get your medicines reviewed and adjusted.  Your PCP may decide to repeat certain labs or tests as needed. Do not drive, operate heavy machinery, perform activities at heights, swimming or participation in water activities or provide baby sitting services if your were admitted for syncope or siezures until you have seen by Primary MD or a Neurologist and advised to do so again. [place] Controlled Substance Reporting System database was reviewed. Do not drive, operate heavy machinery, perform activities at heights, swim,  participate in water activities or provide baby-sitting services while on medications for pain, sleep and mood until your outpatient physician has reevaluated you and advised to do so again.  You are strongly recommended to comply with the dose, frequency and duration of prescribed medications. Activity: As tolerated with Full fall precautions use walker/cane & assistance as needed Avoid using any recreational substances like cigarette, tobacco, alcohol, or non-prescribed drug. If you experience worsening of your admission symptoms, develop shortness of breath, life threatening emergency, suicidal or homicidal thoughts you must seek medical attention immediately by calling 911 or calling your MD immediately  if symptoms less severe. You must read complete instructions/literature along with all the possible adverse reactions/side effects for all the medicines you take and that have been prescribed to you. Take any new medicine only after you have completely understood and accepted all the possible adverse reactions/side effects.  Wear Seat belts while driving. You were cared for by a hospitalist during your hospital stay. If you have any questions about your discharge medications or the care you received while you were in the hospital after you are discharged, you can call the unit and ask to speak with the hospitalist or the covering physician. Once you are discharged, your primary care physician will handle any further medical issues. Please note that NO REFILLS for any discharge medications will be authorized once you are discharged, as it is imperative that you return to your primary care physician (or establish a relationship with a primary care physician if you do not have one).   Increase activity slowly   Complete by: As directed        Discharge Medications:   Allergies as of 05/16/2023       Reactions   Amlodipine Other (See Comments)   Dizzy, headaches   Celebrex [celecoxib] Other (See  Comments)   headaches   Topamax [topiramate] Other (See Comments)   hallucinations   Amitiza [lubiprostone]    dizziness   Carafate [sucralfate]    constipation   Jardiance [empagliflozin]    UTI   Lactulose    Side effects and inefficient   Codeine Nausea Only   Gabapentin Other (See Comments)   dizzy   Hydrocodone Nausea Only, Other (See Comments)   Feeling funny,    Meloxicam Other (See Comments)   jittery   Pravastatin Sodium Other (See Comments)   Muscle aches and pains        Medication List     STOP taking these medications  dicyclomine 10 MG capsule Commonly known as: BENTYL   insulin lispro 100 UNIT/ML KwikPen Commonly known as: HumaLOG KwikPen   pioglitazone 30 MG tablet Commonly known as: ACTOS       TAKE these medications    aspirin EC 81 MG tablet Take 1 tablet (81 mg total) by mouth 2 (two) times daily. To prevent blood clots for 30 days after surgery. What changed: You were already taking a medication with the same name, and this prescription was added. Make sure you understand how and when to take each.   aspirin EC 81 MG tablet Take 1 tablet (81 mg total) by mouth daily. Start taking on: June 15, 2023 What changed: These instructions start on June 15, 2023. If you are unsure what to do until then, ask your doctor or other care provider.   BD Pen Needle Nano U/F 32G X 4 MM Misc Generic drug: Insulin Pen Needle USE AS DIRECTED TWICE DAILY.   busPIRone 15 MG tablet Commonly known as: BUSPAR Take 1 tablet by mouth 2 (two) times daily.   cetirizine 10 MG tablet Commonly known as: ZYRTEC Take 10 mg by mouth daily.   cholecalciferol 25 MCG (1000 UNIT) tablet Commonly known as: VITAMIN D3 Take 1,000 Units by mouth daily.   cyclobenzaprine 5 MG tablet Commonly known as: FLEXERIL Take 5 mg by mouth 3 (three) times daily as needed.   Deplin 15 MG Tabs Take 15 mg by mouth daily. For treatment of depression   Dexcom G6 Receiver  Devi Use as directed   Dexcom G6 Transmitter Misc Use as directed   docusate sodium 100 MG capsule Commonly known as: COLACE Take 1 capsule (100 mg total) by mouth 2 (two) times daily.   famotidine 40 MG tablet Commonly known as: PEPCID Take 1 tablet (40 mg total) by mouth at bedtime.   FreeStyle Libre 3 Sensor Misc 1 DEVICE BY DOES NOT APPLY ROUTE EVERY 14 (FOURTEEN) DAYS. APPLY 1 SENSOR ON UPPER ARM EVERY 14 DAYS FOR CONTINUOUS GLUCOSE MONITORING   Dexcom G6 Sensor Misc Replace every 10 days   insulin aspart 100 UNIT/ML injection Commonly known as: novoLOG Inject 0-5 Units into the skin at bedtime.   insulin aspart 100 UNIT/ML injection Commonly known as: novoLOG Inject 0-9 Units into the skin 3 (three) times daily with meals.   LORazepam 1 MG tablet Commonly known as: ATIVAN Take 1 tablet (1 mg total) by mouth 4 (four) times daily as needed for up to 3 days for anxiety. Reports to taking 1/2 tablet at breakfast and lunch and 1 tablet at bedtime   losartan 25 MG tablet Commonly known as: COZAAR Take 1 tablet (25 mg total) by mouth daily. Start taking on: May 17, 2023   MILK OF MAGNESIA PO Take 1-2 capsules by mouth daily as needed.   Nurtec 75 MG Tbdp Generic drug: Rimegepant Sulfate Take 1 tablet (75 mg total) by mouth as needed.   ondansetron 4 MG tablet Commonly known as: ZOFRAN Take 1 tablet (4 mg total) by mouth every 8 (eight) hours as needed for nausea or vomiting.   OneTouch Verio test strip Generic drug: glucose blood Use as instructed to check blood sugar once a day   pantoprazole 40 MG tablet Commonly known as: PROTONIX Take 1 tablet (40 mg total) by mouth 2 (two) times daily.   repaglinide 1 MG tablet Commonly known as: PRANDIN TAKE 2 TABLETS BY MOUTH BEFORE BREAKFAST, 1 TABLET AT LUNCH AND 1 TABLET AT [facility]  What changed: See the new instructions.   traMADol 50 MG tablet Commonly known as: Ultram Take 1 tablet (50 mg total) by mouth  every 6 (six) hours as needed for severe pain. after surgery What changed:  when to take this additional instructions   Trintellix 20 MG Tabs tablet Generic drug: vortioxetine HBr Take 20 mg by mouth daily.   Ubrelvy 100 MG Tabs Generic drug: Ubrogepant Take 1 tablet at the onset of a migraine can repeat in 2 hours if needed   Voltaren 1 % Gel Generic drug: diclofenac Sodium APPLY 2 GRAMS TO KNEE 2-3 TIMES DAILY PRN   zolmitriptan 5 MG nasal solution Commonly known as: ZOMIG PLACE 1 SPRAY INTO THE NOSE AS NEEDED FOR MIGRAINE (IN THE NOSTRIL AS DIRECTED).         The results of significant diagnostics from this hospitalization (including imaging, microbiology, ancillary and laboratory) are listed below for reference.    Procedures and Diagnostic Studies:   DG HIP UNILAT WITH PELVIS 2-3 VIEWS RIGHT  Result Date: 05/13/2023 CLINICAL DATA:  Femur fracture fixation EXAM: DG HIP (WITH OR WITHOUT PELVIS) 2-3V RIGHT COMPARISON:  Hip radiographs 1 day prior FINDINGS: Two C-arm fluoroscopic images were obtained intraoperatively and submitted for post operative interpretation. The patient is status post intramedullary nail fixation of the proximal femoral fracture. Hardware alignment is within expected limits, without evidence of immediate complication. Femoroacetabular alignment is normal. Please see the performing provider's procedural report for further detail. IMPRESSION: Intraoperative images during intramedullary nail fixation of the proximal right femoral fracture. Electronically Signed   By: Lesia Hausen M.D.   On: 05/13/2023 18:28   DG C-Arm 1-60 Min-No Report  Result Date: 05/13/2023 Fluoroscopy was utilized by the requesting physician.  No radiographic interpretation.   Chest Portable 1 View  Result Date: 05/12/2023 CLINICAL DATA:  Preop, hip fracture. EXAM: PORTABLE CHEST 1 VIEW COMPARISON:  Chest radiograph 05/16/2016 FINDINGS: The cardiomediastinal contours are normal.  Minor subsegmental scarring in the right lower lobe. Pulmonary vasculature is normal. No consolidation, pleural effusion, or pneumothorax. No acute osseous abnormalities are seen. IMPRESSION: No acute chest findings. Electronically Signed   By: Narda Rutherford M.D.   On: 05/12/2023 23:22   DG Hip Unilat W or Wo Pelvis 2-3 Views Right  Result Date: 05/12/2023 CLINICAL DATA:  Right hip pain after fall. Deformity to the right hip EXAM: DG HIP (WITH OR WITHOUT PELVIS) 2-3V RIGHT COMPARISON:  CT 06/28/2021 FINDINGS: Mildly displaced right intertrochanteric hip fracture. There is medial displacement of the lesser trochanter. No dislocation of the hips. Screw fixation right SI joint. IMPRESSION: Right intertrochanteric hip fracture with displaced lesser trochanter. Electronically Signed   By: Minerva Fester M.D.   On: 05/12/2023 20:24     Labs:   Basic Metabolic Panel: Recent Labs  Lab 05/12/23 1929 05/13/23 0257 05/13/23 1651 05/15/23 0301 05/16/23 0313  NA 130* 131*  --  134* 130*  K 3.8 4.7  --  4.5 4.2  CL 96* 97*  --  100 96*  CO2 26 27  --  27 28  GLUCOSE 168* 154*  --  173* 174*  BUN 22 22  --  19 19  CREATININE 1.02* 0.94 0.84 0.97 0.89  CALCIUM 9.1 9.1  --  8.7* 8.4*   GFR Estimated Creatinine Clearance: 37.4 mL/min (by C-G formula based on SCr of 0.89 mg/dL). Liver Function Tests: No results for input(s): "AST", "ALT", "ALKPHOS", "BILITOT", "PROT", "ALBUMIN" in the last 168 hours. No results  for input(s): "LIPASE", "AMYLASE" in the last 168 hours. No results for input(s): "AMMONIA" in the last 168 hours. Coagulation profile Recent Labs  Lab 05/12/23 1929  INR 1.0    CBC: Recent Labs  Lab 05/12/23 1929 05/13/23 0257 05/13/23 1651 05/14/23 0938 05/15/23 0301 05/16/23 0313  WBC 12.2* 10.4 11.7* 14.8* 18.0* 16.1*  NEUTROABS 9.9*  --   --   --  12.7* 10.3*  HGB 10.0* 9.3* 7.3* 6.1* 7.3* 7.5*  HCT 30.1* 28.6* 22.5* 18.6* 21.6* 22.7*  MCV 96.8 97.6 100.4* 98.4 95.2  95.8  PLT 400 358 275 259 249 300   Cardiac Enzymes: No results for input(s): "CKTOTAL", "CKMB", "CKMBINDEX", "TROPONINI" in the last 168 hours. BNP: Invalid input(s): "POCBNP" CBG: Recent Labs  Lab 05/15/23 1153 05/15/23 1702 05/15/23 2149 05/16/23 0503 05/16/23 0759  GLUCAP 169* 237* 143* 151* 140*   D-Dimer No results for input(s): "DDIMER" in the last 72 hours. Hgb A1c No results for input(s): "HGBA1C" in the last 72 hours. Lipid Profile No results for input(s): "CHOL", "HDL", "LDLCALC", "TRIG", "CHOLHDL", "LDLDIRECT" in the last 72 hours. Thyroid function studies No results for input(s): "TSH", "T4TOTAL", "T3FREE", "THYROIDAB" in the last 72 hours.  Invalid input(s): "FREET3" Anemia work up No results for input(s): "VITAMINB12", "FOLATE", "FERRITIN", "TIBC", "IRON", "RETICCTPCT" in the last 72 hours. Microbiology Recent Results (from the past 240 hour(s))  Surgical PCR screen     Status: None   Collection Time: 05/13/23  1:28 AM   Specimen: Nasal Mucosa; Nasal Swab  Result Value Ref Range Status   MRSA, PCR NEGATIVE NEGATIVE Final   Staphylococcus aureus NEGATIVE NEGATIVE Final    Comment: (NOTE) The Xpert SA Assay (FDA approved for NASAL specimens in patients 18 years of age and older), is one component of a comprehensive surveillance program. It is not intended to diagnose infection nor to guide or monitor treatment. Performed at [facility], [address], [place], [place] [address]     Time coordinating discharge: 45 minutes  Signed: Melina Schools Kyden Potash  [facility] 05/16/2023, 10:39 AM

## 2023-05-16 NOTE — TOC Transition Note (Signed)
Transition of Care [facility]) - CM/SW Discharge Note  Patient Details  Name: [name] MRN: [id number] Date of Birth: 01-Sep-1940  [facility] CM/SW Contact:  Ewing Schlein, LCSW Phone Number: 05/16/2023, 12:05 PM  Clinical Narrative: Patient has been approved by [facility] for SNF [phone]) for 7 days and PTAR [phone]). CSW reviewed bed offers with patient and son; [facility] was chosen. CSW confirmed bed with [facility] in admissions. [facility] can admit today. CSW provided facility to Tammy with [facility]. Patient will go to room 609A and the number for report is [phone]. Discharge summary, discharge orders, and SNF transfer report faxed to facility in hub. Medical necessity form done; PTAR scheduled. Discharge packet completed. Patient and son updated regarding transportation. LPN updated. TOC signing off.  Final next level of care: Skilled Nursing Facility Barriers to Discharge: Barriers Resolved  Patient Goals and CMS Choice CMS Medicare.gov Compare Post Acute Care list provided to:: Patient Choice offered to / list presented to : Patient, Adult Children  Discharge Placement Existing PASRR number confirmed : 05/15/23          Patient chooses bed at: [facility] Patient to be transferred to facility by: PTAR Name of family member notified: [place] (son) Patient and family notified of of transfer: 05/16/23  Discharge Plan and Services Additional resources added to the After Visit Summary for   In-house Referral: Clinical Social Work Post Acute Care Choice: Skilled Nursing Facility          DME Arranged: N/A DME Agency: NA  Social Determinants of Health (SDOH) Interventions SDOH Screenings   Food Insecurity: No Food Insecurity (05/13/2023)  Housing: Low Risk  (05/13/2023)  Transportation Needs: No Transportation Needs (05/13/2023)  Utilities: Not At Risk (05/13/2023)  Alcohol Screen: Low Risk  (04/24/2022)  Depression (PHQ2-9): Low Risk  (05/12/2023)  Financial Resource  Strain: Low Risk  (04/24/2022)  Physical Activity: Inactive (04/24/2022)  Social Connections: Socially Integrated (04/24/2022)  Stress: No Stress Concern Present (04/24/2022)  Tobacco Use: Low Risk  (05/13/2023)   Readmission Risk Interventions     No data to display

## 2023-05-16 NOTE — Progress Notes (Signed)
Physical Therapy Treatment Patient Details Name: RACHAL KRAAI MRN: [id number] DOB: 03-29-1941 Today's Date: 05/16/2023   History of Present Illness 82 y.o. female admitted 05/12/23 with R intertrochanteric femur fx, s/p IM nail 05/13/23. PMH: IDDM, HH, migraine, R SI fusion 2016.    PT Comments  Pt motivated, limited by pain. Pt reports 6/10 pain with transfer to EOB, needing max A to mobilize RLE and ultimately mobilize trunk and scoot out to EOB. Pt performs STS rep with min A to power up, RLE extended for comfort, verbal cues for hand placement and LE engagement. Pt with limited steps in room, significantly decrease RLE stance time and weight bearing, strong use of BUE on RW. Pt with good R muscle activation, noted to fatigue with reps and sets needing therapeutic rest breaks between exercises. Returned ice and pt tolerates remaining up in recliner at EOS. Patient will benefit from continued inpatient follow up therapy, <3 hours/day.    If plan is discharge home, recommend the following: A little help with walking and/or transfers;A little help with bathing/dressing/bathroom;Assistance with cooking/housework;Assist for transportation;Help with stairs or ramp for entrance   Can travel by private vehicle     Yes  Equipment Recommendations  None recommended by PT    Recommendations for Other Services       Precautions / Restrictions Precautions Precautions: Fall Precaution Comments: reports 1 other fall in past 6 months Restrictions Weight Bearing Restrictions: No     Mobility  Bed Mobility Overal bed mobility: Needs Assistance Bed Mobility: Supine to Sit     Supine to sit: Max assist, Used rails     General bed mobility comments: LLE slowly inching LE towards edge, RLE max A for moving to EOB, pt using bedrails for trunk management, ultimately bedpad used to rotate and scoot pt forward    Transfers Overall transfer level: Needs assistance Equipment used: Rolling walker  (2 wheels) Transfers: Sit to/from Stand Sit to Stand: From elevated surface, Min assist           General transfer comment: verbal cues for hand placement, slow to power up, A to rise and shift weight anterior, minimal weight on RLE with leg slightly in front; min A for controlled descent into sitting, strong R avoidance leaning to L with sitting    Ambulation/Gait Ambulation/Gait assistance: Min assist Gait Distance (Feet): 6 Feet Assistive device: Rolling walker (2 wheels) Gait Pattern/deviations: Step-to pattern, Decreased step length - right, Decreased step length - left, Antalgic, Decreased weight shift to right, Decreased stance time - right Gait velocity: decreased     General Gait Details: very slow, step to gait pattern, minimal RLE stance time and weightbearing, heavy UE use on RW, limited by pain   Stairs             Wheelchair Mobility     Tilt Bed    Modified Rankin (Stroke Patients Only)       Balance Overall balance assessment: Needs assistance, History of Falls Sitting-balance support: Feet supported Sitting balance-Leahy Scale: Fair     Standing balance support: During functional activity, Bilateral upper extremity supported, Reliant on assistive device for balance Standing balance-Leahy Scale: Poor                              Cognition Arousal: Alert Behavior During Therapy: WFL for tasks assessed/performed Overall Cognitive Status: Within Functional Limits for tasks assessed  Exercises General Exercises - Lower Extremity Ankle Circles/Pumps: Seated, AROM, Both, 20 reps Quad Sets: Seated, AROM, Strengthening, Right, 5 reps Long Arc Quad: Seated, AROM, Strengthening, Both, 15 reps Straight Leg Raises: Seated, AAROM, Right, 5 reps Hip Flexion/Marching: Seated, AROM, Strengthening, Both, 10 reps    General Comments        Pertinent Vitals/Pain Pain  Assessment Pain Assessment: 0-10 Pain Score: 9  Pain Location: R hip Pain Descriptors / Indicators: Discomfort, Sore, Tender Pain Intervention(s): Limited activity within patient's tolerance, Monitored during session, Repositioned, Ice applied    Home Living                          Prior Function            PT Goals (current goals can now be found in the care plan section) Acute Rehab PT Goals Patient Stated Goal: walk well enough to go home; agreeable to ST-SNF PT Goal Formulation: With patient/family Time For Goal Achievement: 05/28/23 Potential to Achieve Goals: Good Progress towards PT goals: Progressing toward goals    Frequency    Min 1X/week      PT Plan      Co-evaluation              AM-PAC PT "6 Clicks" Mobility   Outcome Measure  Help needed turning from your back to your side while in a flat bed without using bedrails?: A Lot Help needed moving from lying on your back to sitting on the side of a flat bed without using bedrails?: A Lot Help needed moving to and from a bed to a chair (including a wheelchair)?: A Little Help needed standing up from a chair using your arms (e.g., wheelchair or bedside chair)?: A Little Help needed to walk in hospital room?: A Little Help needed climbing 3-5 steps with a railing? : Total 6 Click Score: 14    End of Session Equipment Utilized During Treatment: Gait belt Activity Tolerance: Patient limited by pain Patient left: in chair;with chair alarm set;with call bell/phone within reach Nurse Communication: Mobility status PT Visit Diagnosis: Muscle weakness (generalized) (M62.81);Difficulty in walking, not elsewhere classified (R26.2);Pain;History of falling (Z91.81);Unsteadiness on feet (R26.81) Pain - Right/Left: Right Pain - part of body: Hip     Time: 6606-3016 PT Time Calculation (min) (ACUTE ONLY): 30 min  Charges:    $Therapeutic Exercise: 8-22 mins $Therapeutic Activity: 8-22 mins PT  General Charges $$ ACUTE PT VISIT: 1 Visit                     Tori Inri Sobieski PT, DPT 05/16/23, 11:46 AM

## 2023-05-20 ENCOUNTER — Telehealth: Payer: Self-pay

## 2023-05-20 ENCOUNTER — Ambulatory Visit: Payer: PPO | Admitting: Endocrinology

## 2023-05-20 ENCOUNTER — Other Ambulatory Visit: Payer: PPO

## 2023-05-20 NOTE — Transitions of Care (Post Inpatient/ED Visit) (Signed)
05/20/2023  Name: INETHA DEBEER MRN: [id number] DOB: [birth year]  Today's TOC FU Call Status: Today's TOC FU Call Status:: Unsuccessful Call (1st Attempt) Unsuccessful Call (1st Attempt) Date: 05/20/23 (Patient refuse)  Attempted to reach the patient regarding the most recent Inpatient/ED visit.  Follow Up Plan: No Further attempts will be made to this patient. Patient declined to discuss the information and she didn't want to schedule a follow up.   Signature [place], [place]

## 2023-05-21 DIAGNOSIS — I10 Essential (primary) hypertension: Secondary | ICD-10-CM | POA: Diagnosis not present

## 2023-05-21 DIAGNOSIS — E785 Hyperlipidemia, unspecified: Secondary | ICD-10-CM | POA: Diagnosis not present

## 2023-05-21 DIAGNOSIS — E119 Type 2 diabetes mellitus without complications: Secondary | ICD-10-CM | POA: Diagnosis not present

## 2023-05-21 DIAGNOSIS — F32A Depression, unspecified: Secondary | ICD-10-CM | POA: Diagnosis not present

## 2023-05-21 NOTE — Transitions of Care (Post Inpatient/ED Visit) (Signed)
05/21/2023  Name: [name] MRN: [id number] DOB: 05/11/1941  Today's TOC FU Call Status: Today's TOC FU Call Status:: Successful TOC FU Call Completed Unsuccessful Call (1st Attempt) Date: 05/20/23 (Patient refuse) TOC FU Call Complete Date: 05/20/23 Patient's Name and Date of Birth confirmed.  Transition Care Management Follow-up Telephone Call    Items Reviewed:    Medications Reviewed Today: Medications Reviewed Today   Medications were not reviewed in this encounter     Home Care and Equipment/Supplies:    Functional Questionnaire:    Follow up appointments reviewed:    Patient refused to do the [facility] care and she stated that she didn't want to schedule a follow-up appointment.    SIGNATUREReather Laurence, RMA

## 2023-05-22 DIAGNOSIS — F419 Anxiety disorder, unspecified: Secondary | ICD-10-CM | POA: Diagnosis not present

## 2023-05-22 DIAGNOSIS — I10 Essential (primary) hypertension: Secondary | ICD-10-CM | POA: Diagnosis not present

## 2023-05-22 DIAGNOSIS — D500 Iron deficiency anemia secondary to blood loss (chronic): Secondary | ICD-10-CM | POA: Diagnosis not present

## 2023-05-22 DIAGNOSIS — S72141D Displaced intertrochanteric fracture of right femur, subsequent encounter for closed fracture with routine healing: Secondary | ICD-10-CM | POA: Diagnosis not present

## 2023-05-26 ENCOUNTER — Encounter: Payer: Self-pay | Admitting: Family

## 2023-05-27 ENCOUNTER — Ambulatory Visit: Payer: PPO | Admitting: Endocrinology

## 2023-05-30 DIAGNOSIS — S7221XD Displaced subtrochanteric fracture of right femur, subsequent encounter for closed fracture with routine healing: Secondary | ICD-10-CM | POA: Diagnosis not present

## 2023-06-10 DIAGNOSIS — Z7982 Long term (current) use of aspirin: Secondary | ICD-10-CM | POA: Diagnosis not present

## 2023-06-10 DIAGNOSIS — E785 Hyperlipidemia, unspecified: Secondary | ICD-10-CM | POA: Diagnosis not present

## 2023-06-10 DIAGNOSIS — G43909 Migraine, unspecified, not intractable, without status migrainosus: Secondary | ICD-10-CM | POA: Diagnosis not present

## 2023-06-10 DIAGNOSIS — K449 Diaphragmatic hernia without obstruction or gangrene: Secondary | ICD-10-CM | POA: Diagnosis not present

## 2023-06-10 DIAGNOSIS — D62 Acute posthemorrhagic anemia: Secondary | ICD-10-CM | POA: Diagnosis not present

## 2023-06-10 DIAGNOSIS — G4733 Obstructive sleep apnea (adult) (pediatric): Secondary | ICD-10-CM | POA: Diagnosis not present

## 2023-06-10 DIAGNOSIS — F419 Anxiety disorder, unspecified: Secondary | ICD-10-CM | POA: Diagnosis not present

## 2023-06-10 DIAGNOSIS — Z794 Long term (current) use of insulin: Secondary | ICD-10-CM | POA: Diagnosis not present

## 2023-06-10 DIAGNOSIS — R2689 Other abnormalities of gait and mobility: Secondary | ICD-10-CM | POA: Diagnosis not present

## 2023-06-10 DIAGNOSIS — K589 Irritable bowel syndrome without diarrhea: Secondary | ICD-10-CM | POA: Diagnosis not present

## 2023-06-10 DIAGNOSIS — K219 Gastro-esophageal reflux disease without esophagitis: Secondary | ICD-10-CM | POA: Diagnosis not present

## 2023-06-10 DIAGNOSIS — Z9181 History of falling: Secondary | ICD-10-CM | POA: Diagnosis not present

## 2023-06-10 DIAGNOSIS — Z90710 Acquired absence of both cervix and uterus: Secondary | ICD-10-CM | POA: Diagnosis not present

## 2023-06-10 DIAGNOSIS — F32A Depression, unspecified: Secondary | ICD-10-CM | POA: Diagnosis not present

## 2023-06-10 DIAGNOSIS — E871 Hypo-osmolality and hyponatremia: Secondary | ICD-10-CM | POA: Diagnosis not present

## 2023-06-10 DIAGNOSIS — W19XXXD Unspecified fall, subsequent encounter: Secondary | ICD-10-CM | POA: Diagnosis not present

## 2023-06-10 DIAGNOSIS — Z7984 Long term (current) use of oral hypoglycemic drugs: Secondary | ICD-10-CM | POA: Diagnosis not present

## 2023-06-10 DIAGNOSIS — S72141D Displaced intertrochanteric fracture of right femur, subsequent encounter for closed fracture with routine healing: Secondary | ICD-10-CM | POA: Diagnosis not present

## 2023-06-10 DIAGNOSIS — S72141A Displaced intertrochanteric fracture of right femur, initial encounter for closed fracture: Secondary | ICD-10-CM | POA: Diagnosis not present

## 2023-06-10 DIAGNOSIS — E119 Type 2 diabetes mellitus without complications: Secondary | ICD-10-CM | POA: Diagnosis not present

## 2023-06-10 DIAGNOSIS — Z860100 Personal history of colon polyps, unspecified: Secondary | ICD-10-CM | POA: Diagnosis not present

## 2023-06-11 DIAGNOSIS — S72141A Displaced intertrochanteric fracture of right femur, initial encounter for closed fracture: Secondary | ICD-10-CM | POA: Diagnosis not present

## 2023-06-12 DIAGNOSIS — S72141A Displaced intertrochanteric fracture of right femur, initial encounter for closed fracture: Secondary | ICD-10-CM | POA: Diagnosis not present

## 2023-06-13 DIAGNOSIS — S72141A Displaced intertrochanteric fracture of right femur, initial encounter for closed fracture: Secondary | ICD-10-CM | POA: Diagnosis not present

## 2023-06-14 DIAGNOSIS — S72141A Displaced intertrochanteric fracture of right femur, initial encounter for closed fracture: Secondary | ICD-10-CM | POA: Diagnosis not present

## 2023-06-17 ENCOUNTER — Telehealth: Payer: Self-pay | Admitting: Internal Medicine

## 2023-06-17 NOTE — Telephone Encounter (Signed)
Patient is currently in rehab. She said she is in a lot of pain and is taking her tramadol as prescribed. She was advised by the rehab nurse to contact her PCP. Patient would like to know what she should do. She would like a call back at [phone].

## 2023-06-19 ENCOUNTER — Telehealth: Payer: Self-pay | Admitting: Internal Medicine

## 2023-06-19 DIAGNOSIS — R300 Dysuria: Secondary | ICD-10-CM

## 2023-06-19 DIAGNOSIS — M545 Low back pain, unspecified: Secondary | ICD-10-CM | POA: Diagnosis not present

## 2023-06-19 NOTE — Telephone Encounter (Signed)
Pt son Jonier Gatewood called wanting to know if the husband can bring a urine sample to the office because the pt has a fracture hip. Pt thinks she may have an UTI.  Please advise.

## 2023-06-19 NOTE — Telephone Encounter (Signed)
Pt son Florestela Apkarian called wanting to know if the husband can bring a urine sample to the office because the pt has a fracture hip. Pt thinks she may have an UTI.  Please advise.  Best call back [phone] Meah Selesky the son

## 2023-06-19 NOTE — Telephone Encounter (Signed)
error 

## 2023-06-23 NOTE — Telephone Encounter (Signed)
Yes. Thx.

## 2023-06-24 DIAGNOSIS — M545 Low back pain, unspecified: Secondary | ICD-10-CM | POA: Diagnosis not present

## 2023-06-24 NOTE — Addendum Note (Signed)
Addended by: Delsa Grana R on: 06/24/2023 01:38 PM   Modules accepted: Orders

## 2023-06-24 NOTE — Telephone Encounter (Signed)
LDVM for pts son informing him that it was ok for pt to drop off urine sample to the lab

## 2023-06-25 ENCOUNTER — Ambulatory Visit: Payer: PPO | Admitting: Internal Medicine

## 2023-06-26 ENCOUNTER — Ambulatory Visit: Payer: PPO | Admitting: Internal Medicine

## 2023-06-27 DIAGNOSIS — M545 Low back pain, unspecified: Secondary | ICD-10-CM | POA: Diagnosis not present

## 2023-06-30 DIAGNOSIS — S7221XD Displaced subtrochanteric fracture of right femur, subsequent encounter for closed fracture with routine healing: Secondary | ICD-10-CM | POA: Diagnosis not present

## 2023-07-07 ENCOUNTER — Ambulatory Visit: Payer: Self-pay

## 2023-07-07 NOTE — Patient Outreach (Signed)
  Care Coordination   Case closure  Visit Note   07/07/2023 Name: [name] MRN: [id number] DOB: 07/17/1941  [name] is a 82 y.o. year old female who sees Plotnikov, Georgina Quint, MD for primary care. I   Per care guide notation: Patient declines further follow up and engagement by the care management team. Case closed.  Goals Addressed             This Visit's Progress    COMPLETED: care coordination activities       Interventions Today    Flowsheet Row Most Recent Value  General Interventions   General Interventions Discussed/Reviewed General Interventions Reviewed  [Case closed]            SDOH assessments and interventions completed:  Yes  Care Coordination Interventions:  No, not indicated   Follow up plan: No further intervention required.   Encounter Outcome:  Patient Visit Completed   Kathyrn Sheriff, RN, MSN, BSN, CCM Care Management Coordinator [phone]

## 2023-07-08 ENCOUNTER — Telehealth: Payer: Self-pay | Admitting: Internal Medicine

## 2023-07-08 NOTE — Telephone Encounter (Signed)
LDVM for pt of Dr. Loren Racer okay for [facility] nurse to give the pt an Enema to help with constipation.

## 2023-07-08 NOTE — Telephone Encounter (Signed)
Patient's husband [name] called and said the patient is bed-ridden due to needing a hip replacement. He said she has been constipated for about a week and a half. They have a home health nurse that comes to help and said she would be able to help give the patient an enema. They would like to know if they can get Dr. Loren Racer approval for that. Best callback is [phone].

## 2023-07-08 NOTE — Telephone Encounter (Signed)
Yes.  Thank you.

## 2023-07-10 ENCOUNTER — Other Ambulatory Visit: Payer: Self-pay

## 2023-07-10 ENCOUNTER — Telehealth: Payer: Self-pay

## 2023-07-10 MED ORDER — FREESTYLE LIBRE 3 PLUS SENSOR MISC: 3 refills | Status: DC

## 2023-07-10 NOTE — Patient Outreach (Signed)
  Care Coordination      Visit Note   07/10/2023 Name: ERICKA KELZER MRN: [id number] DOB: 12-05-1940  GRAISYN LEMASTER is a 82 y.o. year old female who sees Plotnikov, Georgina Quint, MD for primary care. I spoke with  Irving Burton, spouse (dpr) by phone today.  RNCM spoke with Mr. Mannarino and informed that 07/07/23 note was addended to clarify on 02/06/23 patient declined further follow up and engagement by the care management team. Therefore goals ended and case closed. Mr. Bren voiced understanding. He reports patient home from rehab. Has home health therapy as well as someone coming in to assist. He denies any care management needs at this time.    Goals Addressed             This Visit's Progress    COMPLETED: care coordination activities       Interventions Today    Flowsheet Row Most Recent Value  General Interventions   General Interventions Discussed/Reviewed General Interventions Reviewed, Communication with  [reviewed care management services.]  Communication with PCP/Specialists         SDOH assessments and interventions completed:  No  Care Coordination Interventions:  Yes, provided   Follow up plan: No further intervention required.   Encounter Outcome:  Patient Visit Completed   Kathyrn Sheriff, RN, MSN, BSN, CCM Care Management Coordinator [phone]

## 2023-07-11 ENCOUNTER — Telehealth: Payer: Self-pay | Admitting: Internal Medicine

## 2023-07-11 NOTE — Telephone Encounter (Signed)
The Enema for constipation helped well and Ms [name] is doing better. Misty Stanley called from [facility] with that info to let Dr Macario Golds know updates on [name]. Thanks  LISA-- [employer]

## 2023-07-14 NOTE — Telephone Encounter (Signed)
Noted! Thank you

## 2023-07-17 ENCOUNTER — Telehealth: Payer: Self-pay | Admitting: Adult Health

## 2023-07-17 NOTE — Telephone Encounter (Signed)
[name] cancelled appt due to patient bad disc in back. Will call back to reschedule.

## 2023-07-28 DIAGNOSIS — M545 Low back pain, unspecified: Secondary | ICD-10-CM | POA: Diagnosis not present

## 2023-08-01 ENCOUNTER — Other Ambulatory Visit: Payer: Self-pay | Admitting: Gastroenterology

## 2023-08-06 ENCOUNTER — Ambulatory Visit: Payer: PPO | Admitting: Adult Health

## 2023-08-11 ENCOUNTER — Ambulatory Visit: Payer: PPO | Admitting: Adult Health

## 2023-08-12 ENCOUNTER — Telehealth: Payer: Self-pay | Admitting: *Deleted

## 2023-08-12 DIAGNOSIS — M810 Age-related osteoporosis without current pathological fracture: Secondary | ICD-10-CM

## 2023-08-12 NOTE — Telephone Encounter (Signed)
Message left to return call to [place] at [phone].    Patient needs to schedule lab visit for serum calcium and creatinine prior to Reclast infusion. Orders placed.

## 2023-08-13 ENCOUNTER — Encounter: Payer: Self-pay | Admitting: Internal Medicine

## 2023-08-13 ENCOUNTER — Ambulatory Visit: Payer: PPO | Admitting: Endocrinology

## 2023-08-13 ENCOUNTER — Telehealth: Payer: Self-pay

## 2023-08-13 ENCOUNTER — Ambulatory Visit: Payer: PPO | Admitting: Internal Medicine

## 2023-08-13 ENCOUNTER — Other Ambulatory Visit (HOSPITAL_COMMUNITY): Payer: Self-pay

## 2023-08-13 ENCOUNTER — Encounter: Payer: Self-pay | Admitting: Endocrinology

## 2023-08-13 VITALS — BP 132/80 | HR 100 | Resp 20 | Ht 61.0 in | Wt 112.2 lb

## 2023-08-13 VITALS — BP 110/80 | HR 91 | Temp 98.0°F | Ht 61.0 in | Wt 112.0 lb

## 2023-08-13 DIAGNOSIS — E785 Hyperlipidemia, unspecified: Secondary | ICD-10-CM | POA: Diagnosis not present

## 2023-08-13 DIAGNOSIS — E118 Type 2 diabetes mellitus with unspecified complications: Secondary | ICD-10-CM | POA: Diagnosis not present

## 2023-08-13 DIAGNOSIS — M810 Age-related osteoporosis without current pathological fracture: Secondary | ICD-10-CM | POA: Diagnosis not present

## 2023-08-13 DIAGNOSIS — S22000A Wedge compression fracture of unspecified thoracic vertebra, initial encounter for closed fracture: Secondary | ICD-10-CM | POA: Diagnosis not present

## 2023-08-13 DIAGNOSIS — Z794 Long term (current) use of insulin: Secondary | ICD-10-CM

## 2023-08-13 DIAGNOSIS — E1169 Type 2 diabetes mellitus with other specified complication: Secondary | ICD-10-CM

## 2023-08-13 DIAGNOSIS — S72009A Fracture of unspecified part of neck of unspecified femur, initial encounter for closed fracture: Secondary | ICD-10-CM | POA: Insufficient documentation

## 2023-08-13 DIAGNOSIS — M8000XA Age-related osteoporosis with current pathological fracture, unspecified site, initial encounter for fracture: Secondary | ICD-10-CM | POA: Diagnosis not present

## 2023-08-13 DIAGNOSIS — S72001S Fracture of unspecified part of neck of right femur, sequela: Secondary | ICD-10-CM | POA: Diagnosis not present

## 2023-08-13 DIAGNOSIS — F418 Other specified anxiety disorders: Secondary | ICD-10-CM | POA: Diagnosis not present

## 2023-08-13 LAB — POCT GLYCOSYLATED HEMOGLOBIN (HGB A1C): Hemoglobin A1C: 6.4 % — AB (ref 4.0–5.6)

## 2023-08-13 MED ORDER — REPAGLINIDE 1 MG PO TABS: 1.0000 mg | ORAL_TABLET | Freq: Every day | ORAL | 4 refills | Status: DC

## 2023-08-13 MED ORDER — TRAMADOL HCL 50 MG PO TABS: 50.0000 mg | ORAL_TABLET | Freq: Four times a day (QID) | ORAL | 3 refills | Status: DC | PRN

## 2023-08-13 MED ORDER — LORAZEPAM 1 MG PO TABS: 1.0000 mg | ORAL_TABLET | Freq: Two times a day (BID) | ORAL | 1 refills | Status: DC | PRN

## 2023-08-13 NOTE — Assessment & Plan Note (Signed)
F/u w/endocrinology - Dr Erroll Luna

## 2023-08-13 NOTE — Assessment & Plan Note (Signed)
On Lovastatin

## 2023-08-13 NOTE — Patient Instructions (Signed)
No change in diabetes medications.

## 2023-08-13 NOTE — Assessment & Plan Note (Signed)
D/c Prolia Start Entyvio - R  hip fx and T12 fx Ca and Vit D Tramadol prn  Potential benefits of a long term opioids use as well as potential risks (i.e. addiction risk, apnea etc) and complications (i.e. Somnolence, constipation and others) were explained to the patient and were aknowledged.

## 2023-08-13 NOTE — Assessment & Plan Note (Signed)
Restart extracranial magnetic stimulation

## 2023-08-13 NOTE — Telephone Encounter (Signed)
PCP has asked "pls send a PA Entyvio - R hip fx and T12 fx. Thx "

## 2023-08-13 NOTE — Patient Instructions (Signed)
Entyvio  
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
## 2023-08-13 NOTE — Progress Notes (Signed)
Subjective:  Patient ID: [name], female    DOB: 02-Nov-1940  Age: 82 y.o. MRN: [id number]  CC: Medical Management of Chronic Issues (3 mnth f/um, discuss an increase to the max dose for tramadol and lorazepam. Discuss adding a pain medin addition to the tramadol as pt states it helps some times but she is needing something else for the times it doesn't work. Increase in acid reflux even with taking reflux meds.)   HPI WAKEELAH BRYNER presents for T12 compression fx - in a back brace now R hip fx is better Seeing Dr Shirley Muscat  Outpatient Medications Prior to Visit  Medication Sig Dispense Refill   aspirin EC 81 MG tablet Take 1 tablet (81 mg total) by mouth 2 (two) times daily. To prevent blood clots for 30 days after surgery. 60 tablet 0   aspirin EC 81 MG tablet Take 1 tablet (81 mg total) by mouth daily.     BD PEN NEEDLE NANO U/F 32G X 4 MM MISC USE AS DIRECTED TWICE DAILY. 200 each 3   busPIRone (BUSPAR) 15 MG tablet Take 1 tablet by mouth 2 (two) times daily.     cetirizine (ZYRTEC) 10 MG tablet Take 10 mg by mouth daily.     cholecalciferol (VITAMIN D3) 25 MCG (1000 UNIT) tablet Take 1,000 Units by mouth daily.     Continuous Glucose Receiver (DEXCOM G6 RECEIVER) DEVI Use as directed 1 each 1   Continuous Glucose Sensor (DEXCOM G6 SENSOR) MISC Replace every 10 days 9 each 3   Continuous Glucose Transmitter (DEXCOM G6 TRANSMITTER) MISC Use as directed 1 each 1   cyclobenzaprine (FLEXERIL) 5 MG tablet Take 5 mg by mouth 3 (three) times daily as needed.     docusate sodium (COLACE) 100 MG capsule Take 1 capsule (100 mg total) by mouth 2 (two) times daily.     famotidine (PEPCID) 40 MG tablet Take 1 tablet (40 mg total) by mouth at bedtime. 90 tablet 3   insulin aspart (NOVOLOG) 100 UNIT/ML injection Inject 0-5 Units into the skin at bedtime.     insulin aspart (NOVOLOG) 100 UNIT/ML injection Inject 0-9 Units into the skin 3 (three) times daily with meals.     L-Methylfolate (DEPLIN)  15 MG TABS Take 15 mg by mouth daily. For treatment of depression     losartan (COZAAR) 25 MG tablet Take 1 tablet (25 mg total) by mouth daily.     Magnesium Hydroxide (MILK OF MAGNESIA PO) Take 1-2 capsules by mouth daily as needed.     ondansetron (ZOFRAN) 4 MG tablet Take 1 tablet (4 mg total) by mouth every 8 (eight) hours as needed for nausea or vomiting. 20 tablet 0   ONETOUCH VERIO test strip Use as instructed to check blood sugar once a day 100 each 3   pantoprazole (PROTONIX) 40 MG tablet TAKE 1 TABLET BY MOUTH TWICE A DAY 180 tablet 3   repaglinide (PRANDIN) 1 MG tablet TAKE 2 TABLETS BY MOUTH BEFORE BREAKFAST, 1 TABLET AT LUNCH AND 1 TABLET AT DINNER (Patient taking differently: Take 1 mg by mouth daily at 12 noon.) 360 tablet 1   Rimegepant Sulfate (NURTEC) 75 MG TBDP Take 1 tablet (75 mg total) by mouth as needed. 4 tablet 0   TRINTELLIX 20 MG TABS Take 20 mg by mouth daily.  2   Ubrogepant (UBRELVY) 100 MG TABS Take 1 tablet at the onset of a migraine can repeat in 2 hours if needed 15  tablet 11   VOLTAREN 1 % GEL APPLY 2 GRAMS TO KNEE 2-3 TIMES DAILY PRN  1   zolmitriptan (ZOMIG) 5 MG nasal solution PLACE 1 SPRAY INTO THE NOSE AS NEEDED FOR MIGRAINE (IN THE NOSTRIL AS DIRECTED). 6 each 5   HYDROcodone-acetaminophen (NORCO/VICODIN) 5-325 MG tablet Take 1 tablet by mouth.     traMADol (ULTRAM) 50 MG tablet Take 1 tablet (50 mg total) by mouth every 6 (six) hours as needed for severe pain. after surgery 28 tablet 0   Continuous Glucose Sensor (FREESTYLE LIBRE 3 PLUS SENSOR) MISC Change sensor every 15 days. (Patient not taking: Reported on 08/13/2023) 6 each 3   Continuous Glucose Sensor (FREESTYLE LIBRE 3 SENSOR) MISC 1 DEVICE BY DOES NOT APPLY ROUTE EVERY 14 (FOURTEEN) DAYS. APPLY 1 SENSOR ON UPPER ARM EVERY 14 DAYS FOR CONTINUOUS GLUCOSE MONITORING (Patient not taking: Reported on 08/13/2023) 5 each 1   No facility-administered medications prior to visit.    ROS: Review of  Systems  Constitutional:  Negative for activity change, appetite change, chills, fatigue and unexpected weight change.  HENT:  Negative for congestion, mouth sores and sinus pressure.   Eyes:  Negative for visual disturbance.  Respiratory:  Negative for cough and chest tightness.   Gastrointestinal:  Negative for abdominal pain and nausea.  Genitourinary:  Negative for difficulty urinating, frequency and vaginal pain.  Musculoskeletal:  Positive for arthralgias, back pain and gait problem.  Skin:  Negative for pallor and rash.  Neurological:  Negative for dizziness, tremors, weakness, numbness and headaches.  Psychiatric/Behavioral:  Negative for confusion and sleep disturbance.     Objective:  BP 110/80 (BP Location: Right Arm, Patient Position: Sitting, Cuff Size: Normal)   Pulse 91   Temp 98 F (36.7 C) (Oral)   Ht 5\' 1"  (1.549 m)   Wt 112 lb (50.8 kg)   SpO2 94%   BMI 21.16 kg/m   BP Readings from Last 3 Encounters:  08/13/23 110/80  08/13/23 132/80  05/16/23 (!) 142/68    Wt Readings from Last 3 Encounters:  08/13/23 112 lb (50.8 kg)  08/13/23 112 lb 3.2 oz (50.9 kg)  05/12/23 113 lb (51.3 kg)    Physical Exam Constitutional:      General: She is not in acute distress.    Appearance: Normal appearance. She is well-developed. She is not toxic-appearing.  HENT:     Head: Normocephalic.     Right Ear: External ear normal.     Left Ear: External ear normal.     Nose: Nose normal.  Eyes:     General:        Right eye: No discharge.        Left eye: No discharge.     Conjunctiva/sclera: Conjunctivae normal.     Pupils: Pupils are equal, round, and reactive to light.  Neck:     Thyroid: No thyromegaly.     Vascular: No JVD.     Trachea: No tracheal deviation.  Cardiovascular:     Rate and Rhythm: Normal rate and regular rhythm.     Heart sounds: Normal heart sounds.  Pulmonary:     Effort: No respiratory distress.     Breath sounds: No stridor. No wheezing.   Abdominal:     General: Bowel sounds are normal. There is no distension.     Palpations: Abdomen is soft. There is no mass.     Tenderness: There is no abdominal tenderness. There is no guarding or rebound.  Musculoskeletal:        General: Tenderness present.     Cervical back: Normal range of motion and neck supple. No rigidity.     Right lower leg: No edema.     Left lower leg: No edema.  Lymphadenopathy:     Cervical: No cervical adenopathy.  Skin:    Findings: No erythema or rash.  Neurological:     Cranial Nerves: No cranial nerve deficit.     Motor: No abnormal muscle tone.     Coordination: Coordination abnormal.     Gait: Gait abnormal.     Deep Tendon Reflexes: Reflexes normal.  Psychiatric:        Behavior: Behavior normal.        Thought Content: Thought content normal.        Judgment: Judgment normal.   Using a walker Using a back brace    A total time of 45 minutes was spent preparing to see the patient, reviewing tests, x-rays, operative reports and other medical records.  Also, obtaining history and performing comprehensive physical exam.  Additionally, counseling the patient regarding the above listed issues.   Finally, documenting clinical information in the health records, coordination of care, educating the patient. It is a complex case.  Lab Results  Component Value Date   WBC 16.1 (H) 05/16/2023   HGB 7.5 (L) 05/16/2023   HCT 22.7 (L) 05/16/2023   PLT 300 05/16/2023   GLUCOSE 174 (H) 05/16/2023   CHOL 116 05/10/2020   TRIG 53.0 05/10/2020   HDL 71.30 05/10/2020   LDLDIRECT 48.0 06/18/2022   LDLCALC 34 05/10/2020   ALT 24 06/18/2022   AST 31 06/18/2022   NA 130 (L) 05/16/2023   K 4.2 05/16/2023   CL 96 (L) 05/16/2023   CREATININE 0.89 05/16/2023   BUN 19 05/16/2023   CO2 28 05/16/2023   TSH 0.89 12/10/2021   INR 1.0 05/12/2023   HGBA1C 6.4 (A) 08/13/2023   MICROALBUR 1.0 06/18/2022    DG HIP UNILAT WITH PELVIS 2-3 VIEWS RIGHT Result  Date: 05/13/2023 CLINICAL DATA:  Femur fracture fixation EXAM: DG HIP (WITH OR WITHOUT PELVIS) 2-3V RIGHT COMPARISON:  Hip radiographs 1 day prior FINDINGS: Two C-arm fluoroscopic images were obtained intraoperatively and submitted for post operative interpretation. The patient is status post intramedullary nail fixation of the proximal femoral fracture. Hardware alignment is within expected limits, without evidence of immediate complication. Femoroacetabular alignment is normal. Please see the performing provider's procedural report for further detail. IMPRESSION: Intraoperative images during intramedullary nail fixation of the proximal right femoral fracture. Electronically Signed   By: Lesia Hausen M.D.   On: 05/13/2023 18:28   DG C-Arm 1-60 Min-No Report Result Date: 05/13/2023 Fluoroscopy was utilized by the requesting physician.  No radiographic interpretation.   Chest Portable 1 View Result Date: 05/12/2023 CLINICAL DATA:  Preop, hip fracture. EXAM: PORTABLE CHEST 1 VIEW COMPARISON:  Chest radiograph 05/16/2016 FINDINGS: The cardiomediastinal contours are normal. Minor subsegmental scarring in the right lower lobe. Pulmonary vasculature is normal. No consolidation, pleural effusion, or pneumothorax. No acute osseous abnormalities are seen. IMPRESSION: No acute chest findings. Electronically Signed   By: Narda Rutherford M.D.   On: 05/12/2023 23:22   DG Hip Unilat W or Wo Pelvis 2-3 Views Right Result Date: 05/12/2023 CLINICAL DATA:  Right hip pain after fall. Deformity to the right hip EXAM: DG HIP (WITH OR WITHOUT PELVIS) 2-3V RIGHT COMPARISON:  CT 06/28/2021 FINDINGS: Mildly displaced right intertrochanteric hip fracture. There  is medial displacement of the lesser trochanter. No dislocation of the hips. Screw fixation right SI joint. IMPRESSION: Right intertrochanteric hip fracture with displaced lesser trochanter. Electronically Signed   By: Minerva Fester M.D.   On: 05/12/2023 20:24     Assessment & Plan:   Problem List Items Addressed This Visit     DM type 2, controlled, with complication (HCC)   F/u w/endocrinology - Dr Erroll Luna      Depression with anxiety   Restart extracranial magnetic stimulation      Relevant Medications   LORazepam (ATIVAN) 1 MG tablet   Osteoporosis   D/c Prolia Start Entyvio - R  hip fx and T12 fx Ca and Vit D       Hyperlipidemia associated with type 2 diabetes mellitus (HCC) - Primary   On Lovastatin      Compression fracture of body of thoracic vertebra (HCC)   D/c Prolia Start Entyvio - R  hip fx and T12 fx Ca and Vit D Tramadol prn  Potential benefits of a long term opioids use as well as potential risks (i.e. addiction risk, apnea etc) and complications (i.e. Somnolence, constipation and others) were explained to the patient and were aknowledged.       Hip fracture (HCC)   D/c Prolia Start Entyvio - R  hip fx and T12 fx Ca and Vit D Tramadol prn  Potential benefits of a long term opioids use as well as potential risks (i.e. addiction risk, apnea etc) and complications (i.e. Somnolence, constipation and others) were explained to the patient and were aknowledged.         Meds ordered this encounter  Medications   traMADol (ULTRAM) 50 MG tablet    Sig: Take 1-2 tablets (50-100 mg total) by mouth every 6 (six) hours as needed for severe pain (pain score 7-10). after surgery    Dispense:  180 tablet    Refill:  3   LORazepam (ATIVAN) 1 MG tablet    Sig: Take 1 tablet (1 mg total) by mouth 2 (two) times daily as needed for anxiety.    Dispense:  180 tablet    Refill:  1      Follow-up: Return in about 2 months (around 10/14/2023) for a follow-up visit.  Sonda Primes, MD

## 2023-08-13 NOTE — Assessment & Plan Note (Addendum)
D/c Prolia Start Entyvio - R  hip fx and T12 fx Ca and Vit D

## 2023-08-14 ENCOUNTER — Other Ambulatory Visit (HOSPITAL_COMMUNITY): Payer: Self-pay

## 2023-08-14 NOTE — Telephone Encounter (Signed)
It is a pen for Entyvio injections in the office monthly.  Thank you

## 2023-08-15 ENCOUNTER — Other Ambulatory Visit (HOSPITAL_COMMUNITY): Payer: Self-pay

## 2023-08-15 ENCOUNTER — Telehealth: Payer: Self-pay

## 2023-08-15 NOTE — Telephone Encounter (Signed)
Evenity VOB initiated via [email or web address]

## 2023-08-21 ENCOUNTER — Other Ambulatory Visit (HOSPITAL_COMMUNITY): Payer: Self-pay

## 2023-08-21 NOTE — Telephone Encounter (Signed)
Pt ready for scheduling for Evenity on or after : 08/21/23  Out-of-pocket cost due at time of visit: $~475  Number of injection/visits approved: 12  Primary: [employer] - Medicare Evenity co-insurance: 20% Admin fee co-insurance: 20%  Secondary: N/A Evenity co-insurance:  Admin fee co-insurance:   Medical Benefit Details: Date Benefits were checked: 08/18/23 Deductible: no/ Coinsurance: 20%/ Admin Fee: 20%  Prior Auth: not required PA# Expiration Date:  # of doses approved:  Pharmacy benefit: Copay $-- If patient wants fill through the pharmacy benefit please send prescription to:  -- , and include estimated need by date in rx notes. Pharmacy will ship medication directly to the office.  Patient not eligible for Evenity Copay Card. Copay Card can make patient's cost as little as $25. Link to apply: [email or web address]://www.[employer]gensupp[employer]plus.[email or web address]/copay  ** This summary of benefits is an estimation of the patient's out-of-pocket cost. Exact cost may very based on individual plan coverage.

## 2023-08-21 NOTE — Telephone Encounter (Signed)
Pt ready for scheduling for Evenity on or after : 08/21/23   Out-of-pocket cost due at time of visit: $~475   Number of injection/visits approved: 12   Primary: [employer] - Medicare Evenity co-insurance: 20% Admin fee co-insurance: 20%     Spoke with the pt and was able to inform her of the message above per her insurance and pt stated that the $475 OOP cost is very high. She is wanting to know if there is an alternative to different Tx she can get?

## 2023-08-22 NOTE — Telephone Encounter (Signed)
Is it less expensive to buy Evenity at the pharmacy and bring to the office for injections? Thank you

## 2023-08-25 ENCOUNTER — Other Ambulatory Visit (HOSPITAL_COMMUNITY): Payer: Self-pay

## 2023-08-25 NOTE — Telephone Encounter (Signed)
Pharmacy Patient Advocate Encounter  Insurance verification completed.    The patient is insured through [facility] ADVANTAGE/RX ADVANCE   Ran test claim for Evenity. Currently a quantity of 2.49ml is a 30 day supply and the co-pay is Product not on Formulary. Evenity is covered under medical benefits .   This test claim was processed through [facility]- copay amounts may vary at other pharmacies due to pharmacy/plan contracts, or as the patient moves through the different stages of their insurance plan.

## 2023-08-28 DIAGNOSIS — M5416 Radiculopathy, lumbar region: Secondary | ICD-10-CM | POA: Diagnosis not present

## 2023-08-28 DIAGNOSIS — S22080A Wedge compression fracture of T11-T12 vertebra, initial encounter for closed fracture: Secondary | ICD-10-CM | POA: Diagnosis not present

## 2023-09-01 ENCOUNTER — Ambulatory Visit: Payer: Self-pay | Admitting: Internal Medicine

## 2023-09-01 ENCOUNTER — Telehealth: Payer: PPO | Admitting: Family Medicine

## 2023-09-01 DIAGNOSIS — R110 Nausea: Secondary | ICD-10-CM | POA: Diagnosis not present

## 2023-09-01 DIAGNOSIS — K219 Gastro-esophageal reflux disease without esophagitis: Secondary | ICD-10-CM

## 2023-09-01 MED ORDER — ONDANSETRON 4 MG PO TBDP: 4.0000 mg | ORAL_TABLET | Freq: Three times a day (TID) | ORAL | 0 refills | Status: DC | PRN

## 2023-09-01 NOTE — Telephone Encounter (Addendum)
     Chief Complaint: nausea Symptoms: nausea Frequency: comest and goes for 2 days now  Disposition: [] ED /[] Urgent Care (no appt availability in office) / [x] Appointment(In office/virtual)/ []  [facility]/ [] Home Care/ [] Refused Recommended Disposition /[] [facility]/ []  Follow-up with PCP Additional Notes: Pt husband called to report nausea on pt. Pt has a degenerative disk in her back and has to wear a brace when up.  Pt states it feels like reflux. Pt just took a motion sickness tablet 15-30 mins ago. Pt denies SOB/chest pain. No vomiting in two days. Pt denied being around anyone with stomach bug. Pt declined office visit so virtual appt was made for 2:45 today. Pt instructed to have my chart open and ready. Care advice given and told to call back if symptoms worsen.   Copied from CRM [id number]. Topic: Clinical - Red Word Triage >> Sep 01, 2023 11:30 AM Isabell A wrote: [facility] that prompted transfer to Nurse Triage: Stomach is feeling upset, nauseous   Pain level 7/10. Answer Assessment - Initial Assessment Questions 1. NAUSEA SEVERITY: How bad is the nausea? (e.g., mild, moderate, severe; dehydration, weight loss)   - MILD: loss of appetite without change in eating habits   - MODERATE: decreased oral intake without significant weight loss, dehydration, or malnutrition   - SEVERE: inadequate caloric or fluid intake, significant weight loss, symptoms of dehydration     moderate 2. ONSET: When did the nausea begin?     Comes and goes- had for a few days 3. VOMITING: Any vomiting? If Yes, ask: How many times today?     Not yet 4. RECURRENT SYMPTOM: Have you had nausea before? If Yes, ask: When was the last time? What happened that time?     Been going on for a few days 5. CAUSE: What do you think is causing the nausea?     Not sure  Protocols used: Nausea-A-AH

## 2023-09-01 NOTE — Progress Notes (Signed)
 Virtual Visit Consent   [name], you are scheduled for a virtual visit with a [facility] provider today. Just as with appointments in the office, your consent must be obtained to participate. Your consent will be active for this visit and any virtual visit you may have with one of our providers in the next 365 days. If you have a MyChart account, a copy of this consent can be sent to you electronically.  As this is a virtual visit, video technology does not allow for your provider to perform a traditional examination. This may limit your provider's ability to fully assess your condition. If your provider identifies any concerns that need to be evaluated in person or the need to arrange testing (such as labs, EKG, etc.), we will make arrangements to do so. Although advances in technology are sophisticated, we cannot ensure that it will always work on either your end or our end. If the connection with a video visit is poor, the visit may have to be switched to a telephone visit. With either a video or telephone visit, we are not always able to ensure that we have a secure connection.  By engaging in this virtual visit, you consent to the provision of healthcare and authorize for your insurance to be billed (if applicable) for the services provided during this visit. Depending on your insurance coverage, you may receive a charge related to this service.  I need to obtain your verbal consent now. Are you willing to proceed with your visit today? [name] has provided verbal consent on 09/01/2023 for a virtual visit (video or telephone). Cheryl CHRISTELLA Barefoot, NP  Date: 09/01/2023 2:50 PM  Virtual Visit via Video Note   I, [name], connected with  [name]  ([id number], 08-13-1941) on 09/01/23 at  2:45 PM EST by a video-enabled telemedicine application and verified that I am speaking with the correct person using two identifiers.  Location: Patient: Virtual Visit Location Patient:  Home Provider: Virtual Visit Location Provider: Home Office   I discussed the limitations of evaluation and management by telemedicine and the availability of in person appointments. The patient expressed understanding and agreed to proceed.    History of Present Illness: [name] is a 83 y.o. who identifies as a female who was assigned female at birth, and is being seen today for nausea  Onset was 3 days of nausea, seems like acid reflux more than anything, burning and sour taste in mouth.    Associated symptoms occur after eating breakfast- and it settles once she is resting some. It seems to come back after lunch.  Bowel changes in- she reports taking colace twice daily and miralax daily- to help with creating a movement. Is not having regular BM's, but that is not unusual she says.  Modifying factors are Meclizine  HCL 25 (asked to take Tums while on visit) Denies chest pain, shortness of breath, fevers, chills, vomiting,  Blood sugar is elevated at 277 but it is reported that it spikes and then goes down. This morning it was 124 on awaking up.   Problems:  Patient Active Problem List   Diagnosis Date Noted   Compression fracture of body of thoracic vertebra (HCC) 08/13/2023   Hip fracture (HCC) 08/13/2023   Closed intertrochanteric fracture of hip, right, initial encounter (HCC) 05/12/2023   Hyponatremia 05/12/2023   Leukocytosis 05/12/2023   Insulin  dependent type 2 diabetes mellitus (HCC) 05/12/2023   Migraine    Knee  instability, left 11/07/2022   Weight loss 08/08/2022   Hot flashes 12/10/2021   HTN (hypertension) 07/11/2021   Epigastric pain 06/21/2021   CRI (chronic renal insufficiency), stage 3 (moderate) (HCC) 06/21/2021   Aphthous ulcer of mouth 02/07/2021   Memory problem 05/23/2020   History of total abdominal hysterectomy and bilateral salpingo-oophorectomy 05/15/2020   Tremor 02/15/2020   Vertigo 11/23/2018   Osteoporosis 07/15/2018   Lumbar  radiculopathy 03/23/2018   Diabetic peripheral neuropathy associated with type 2 diabetes mellitus (HCC) 04/10/2017   Chronic nausea 03/10/2017   Hyperlipidemia associated with type 2 diabetes mellitus (HCC) 10/22/2016   IDA (iron  deficiency anemia) 10/16/2016   Normocytic anemia 09/04/2016   Intractable chronic migraine without aura and with status migrainosus 08/12/2016   CAP (community acquired pneumonia) 05/28/2016   Acute confusional state 05/16/2016   Chronic migraine w/o aura w/o status migrainosus, not intractable 04/13/2016   OSA on CPAP 01/30/2016   Acute URI 10/17/2015   Rash 07/12/2015   SI (sacroiliac) pain 03/30/2015   Sciatica of right side 08/08/2014   Acute blood loss anemia 03/09/2014   DJD (degenerative joint disease) of knee 11/10/2013   Constipation 10/29/2013   Obesity 08/04/2013   Osteoarthritis of right knee 08/04/2013   Edema 05/11/2013   Fall 09/16/2012   Neck pain 09/16/2012   Low back pain 09/16/2012   Cerumen impaction 08/07/2011   Acute bronchitis 05/21/2011   Fatigue 08/07/2010   PARESTHESIA 08/07/2010   DYSPHAGIA 07/13/2009   Irritable bowel syndrome 09/24/2007   Elevated liver function tests 09/24/2007   DM type 2, controlled, with complication (HCC) 06/26/2007   Anxiety state 06/26/2007   Depression with anxiety 06/26/2007   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 heart failure 06/26/2007   GERD 06/26/2007   Osteoarthritis 06/26/2007   History of colonic polyps 06/26/2007   SPLENECTOMY, TOTAL, HX OF 06/26/2007    Allergies:  Allergies  Allergen Reactions   Amlodipine Other (See Comments)    Dizzy, headaches   Celebrex  [Celecoxib ] Other (See Comments)    headaches   Topamax  [Topiramate ] Other (See Comments)    hallucinations   Amitiza  [Lubiprostone ]     dizziness   Carafate  [Sucralfate ]     constipation   Jardiance  [Empagliflozin ]     UTI   Lactulose      Side effects and inefficient   Codeine Nausea Only   Gabapentin  Other (See Comments)    dizzy   Hydrocodone  Nausea Only and Other (See Comments)    Feeling funny,    Meloxicam Other (See Comments)    jittery   Pravastatin Sodium Other (See Comments)    Muscle aches and pains   Medications:  Current Outpatient Medications:    aspirin  EC 81 MG tablet, Take 1 tablet (81 mg total) by mouth 2 (two) times daily. To prevent blood clots for 30 days after surgery., Disp: 60 tablet, Rfl: 0   aspirin  EC 81 MG tablet, Take 1 tablet (81 mg total) by mouth daily., Disp: , Rfl:    BD PEN NEEDLE NANO U/F 32G X 4 MM MISC, USE AS DIRECTED TWICE DAILY., Disp: 200 each, Rfl: 3   busPIRone  (BUSPAR ) 15 MG tablet, Take 1 tablet by mouth 2 (two) times daily., Disp: , Rfl:    cetirizine (ZYRTEC) 10 MG tablet, Take 10 mg by mouth daily., Disp: , Rfl:    cholecalciferol (VITAMIN D3) 25 MCG (1000 UNIT) tablet, Take 1,000 Units by mouth daily., Disp: , Rfl:    Continuous Glucose Receiver (DEXCOM  G6 RECEIVER) DEVI, Use as directed, Disp: 1 each, Rfl: 1   Continuous Glucose Sensor (DEXCOM G6 SENSOR) MISC, Replace every 10 days, Disp: 9 each, Rfl: 3   Continuous Glucose Sensor (FREESTYLE LIBRE 3 PLUS SENSOR) MISC, Change sensor every 15 days. (Patient not taking: Reported on 08/13/2023), Disp: 6 each, Rfl: 3   Continuous Glucose Sensor (FREESTYLE LIBRE 3 SENSOR) MISC, 1 DEVICE BY DOES NOT APPLY ROUTE EVERY 14 (FOURTEEN) DAYS. APPLY 1 SENSOR ON UPPER ARM EVERY 14 DAYS FOR CONTINUOUS GLUCOSE MONITORING (Patient not taking: Reported on 08/13/2023), Disp: 5 each, Rfl: 1   Continuous Glucose Transmitter (DEXCOM G6 TRANSMITTER) MISC, Use as directed, Disp: 1 each, Rfl: 1   cyclobenzaprine  (FLEXERIL ) 5 MG tablet, Take 5 mg by mouth 3 (three) times daily as needed., Disp: , Rfl:    docusate sodium  (COLACE) 100 MG capsule, Take 1 capsule (100 mg total) by mouth 2 (two) times daily., Disp: , Rfl:    famotidine  (PEPCID ) 40 MG tablet, Take 1 tablet (40 mg total) by mouth at bedtime., Disp: 90  tablet, Rfl: 3   insulin  aspart (NOVOLOG ) 100 UNIT/ML injection, Inject 0-5 Units into the skin at bedtime., Disp: , Rfl:    insulin  aspart (NOVOLOG ) 100 UNIT/ML injection, Inject 0-9 Units into the skin 3 (three) times daily with meals., Disp: , Rfl:    L-Methylfolate (DEPLIN) 15 MG TABS, Take 15 mg by mouth daily. For treatment of depression, Disp: , Rfl:    LORazepam  (ATIVAN ) 1 MG tablet, Take 1 tablet (1 mg total) by mouth 2 (two) times daily as needed for anxiety., Disp: 180 tablet, Rfl: 1   losartan  (COZAAR ) 25 MG tablet, Take 1 tablet (25 mg total) by mouth daily., Disp: , Rfl:    Magnesium Hydroxide (MILK OF MAGNESIA PO), Take 1-2 capsules by mouth daily as needed., Disp: , Rfl:    ondansetron  (ZOFRAN ) 4 MG tablet, Take 1 tablet (4 mg total) by mouth every 8 (eight) hours as needed for nausea or vomiting., Disp: 20 tablet, Rfl: 0   ONETOUCH VERIO test strip, Use as instructed to check blood sugar once a day, Disp: 100 each, Rfl: 3   pantoprazole  (PROTONIX ) 40 MG tablet, TAKE 1 TABLET BY MOUTH TWICE A DAY, Disp: 180 tablet, Rfl: 3   repaglinide  (PRANDIN ) 1 MG tablet, Take 1 tablet (1 mg total) by mouth daily at 12 noon., Disp: 90 tablet, Rfl: 4   Rimegepant Sulfate (NURTEC) 75 MG TBDP, Take 1 tablet (75 mg total) by mouth as needed., Disp: 4 tablet, Rfl: 0   traMADol  (ULTRAM ) 50 MG tablet, Take 1-2 tablets (50-100 mg total) by mouth every 6 (six) hours as needed for severe pain (pain score 7-10). after surgery, Disp: 180 tablet, Rfl: 3   TRINTELLIX  20 MG TABS, Take 20 mg by mouth daily., Disp: , Rfl: 2   Ubrogepant  (UBRELVY ) 100 MG TABS, Take 1 tablet at the onset of a migraine can repeat in 2 hours if needed, Disp: 15 tablet, Rfl: 11   VOLTAREN 1 % GEL, APPLY 2 GRAMS TO KNEE 2-3 TIMES DAILY PRN, Disp: , Rfl: 1   zolmitriptan  (ZOMIG ) 5 MG nasal solution, PLACE 1 SPRAY INTO THE NOSE AS NEEDED FOR MIGRAINE (IN THE NOSTRIL AS DIRECTED)., Disp: 6 each, Rfl: 5  Observations/Objective: Patient is  well-developed, well-nourished in no acute distress.  Resting comfortably  at home.  Head is normocephalic, atraumatic.  No labored breathing.  Speech is clear and coherent with logical content.  Patient  is alert and oriented at baseline.    Assessment and Plan:  1. Nausea (Primary)  - ondansetron  (ZOFRAN -ODT) 4 MG disintegrating tablet; Take 1 tablet (4 mg total) by mouth every 8 (eight) hours as needed for nausea or vomiting.  Dispense: 20 tablet; Refill: 0  2. Gastroesophageal reflux disease without esophagitis   -adjustment of how Protonix  should be taken: on an empty stomach an hour prior to eating if possible. -use Tums if stomach is sour  (noted improvement after chewing Tums, during visit) -use Zofran  if Tums does not help.  -get appt with PCP moved up sooner. - seems to be willing to try Carafate  again- reduced dose? Maybe once in the morning to avoid constipation, does not seem to be a true allergy.     Reviewed side effects, risks and benefits of medication.    Patient acknowledged agreement and understanding of the plan.   Past Medical, Surgical, Social History, Allergies, and Medications have been Reviewed.     Follow Up Instructions: I discussed the assessment and treatment plan with the patient. The patient was provided an opportunity to ask questions and all were answered. The patient agreed with the plan and demonstrated an understanding of the instructions.  A copy of instructions were sent to the patient via MyChart unless otherwise noted below.    The patient was advised to call back or seek an in-person evaluation if the symptoms worsen or if the condition fails to improve as anticipated.    Cheryl CHRISTELLA Barefoot, NP

## 2023-09-01 NOTE — Patient Instructions (Addendum)
 [name], thank you for joining [name], NP for today's virtual visit.  While this provider is not your primary care provider (PCP), if your PCP is located in our provider database this encounter information will be shared with them immediately following your visit.   A [facility] MyChart account gives you access to today's visit and all your visits, tests, and labs performed at [facility]  click here if you don't have a [facility] MyChart account or go to [email or web address]  Consent: (Patient) [name] provided verbal consent for this virtual visit at the beginning of the encounter.  Current Medications:  Current Outpatient Medications:    ondansetron  (ZOFRAN -ODT) 4 MG disintegrating tablet, Take 1 tablet (4 mg total) by mouth every 8 (eight) hours as needed for nausea or vomiting., Disp: 20 tablet, Rfl: 0   aspirin  EC 81 MG tablet, Take 1 tablet (81 mg total) by mouth 2 (two) times daily. To prevent blood clots for 30 days after surgery., Disp: 60 tablet, Rfl: 0   aspirin  EC 81 MG tablet, Take 1 tablet (81 mg total) by mouth daily., Disp: , Rfl:    BD PEN NEEDLE NANO U/F 32G X 4 MM MISC, USE AS DIRECTED TWICE DAILY., Disp: 200 each, Rfl: 3   busPIRone  (BUSPAR ) 15 MG tablet, Take 1 tablet by mouth 2 (two) times daily., Disp: , Rfl:    cetirizine (ZYRTEC) 10 MG tablet, Take 10 mg by mouth daily., Disp: , Rfl:    cholecalciferol (VITAMIN D3) 25 MCG (1000 UNIT) tablet, Take 1,000 Units by mouth daily., Disp: , Rfl:    Continuous Glucose Receiver (DEXCOM G6 RECEIVER) DEVI, Use as directed, Disp: 1 each, Rfl: 1   Continuous Glucose Sensor (DEXCOM G6 SENSOR) MISC, Replace every 10 days, Disp: 9 each, Rfl: 3   Continuous Glucose Sensor (FREESTYLE LIBRE 3 PLUS SENSOR) MISC, Change sensor every 15 days. (Patient not taking: Reported on 08/13/2023), Disp: 6 each, Rfl: 3   Continuous Glucose Sensor (FREESTYLE LIBRE 3 SENSOR) MISC, 1 DEVICE BY DOES NOT APPLY ROUTE  EVERY 14 (FOURTEEN) DAYS. APPLY 1 SENSOR ON UPPER ARM EVERY 14 DAYS FOR CONTINUOUS GLUCOSE MONITORING (Patient not taking: Reported on 08/13/2023), Disp: 5 each, Rfl: 1   Continuous Glucose Transmitter (DEXCOM G6 TRANSMITTER) MISC, Use as directed, Disp: 1 each, Rfl: 1   cyclobenzaprine  (FLEXERIL ) 5 MG tablet, Take 5 mg by mouth 3 (three) times daily as needed., Disp: , Rfl:    docusate sodium  (COLACE) 100 MG capsule, Take 1 capsule (100 mg total) by mouth 2 (two) times daily., Disp: , Rfl:    famotidine  (PEPCID ) 40 MG tablet, Take 1 tablet (40 mg total) by mouth at bedtime., Disp: 90 tablet, Rfl: 3   insulin  aspart (NOVOLOG ) 100 UNIT/ML injection, Inject 0-5 Units into the skin at bedtime., Disp: , Rfl:    insulin  aspart (NOVOLOG ) 100 UNIT/ML injection, Inject 0-9 Units into the skin 3 (three) times daily with meals., Disp: , Rfl:    L-Methylfolate (DEPLIN) 15 MG TABS, Take 15 mg by mouth daily. For treatment of depression, Disp: , Rfl:    LORazepam  (ATIVAN ) 1 MG tablet, Take 1 tablet (1 mg total) by mouth 2 (two) times daily as needed for anxiety., Disp: 180 tablet, Rfl: 1   losartan  (COZAAR ) 25 MG tablet, Take 1 tablet (25 mg total) by mouth daily., Disp: , Rfl:    Magnesium Hydroxide (MILK OF MAGNESIA PO), Take 1-2 capsules by mouth daily as needed., Disp: ,  Rfl:    ONETOUCH VERIO test strip, Use as instructed to check blood sugar once a day, Disp: 100 each, Rfl: 3   pantoprazole  (PROTONIX ) 40 MG tablet, TAKE 1 TABLET BY MOUTH TWICE A DAY, Disp: 180 tablet, Rfl: 3   repaglinide  (PRANDIN ) 1 MG tablet, Take 1 tablet (1 mg total) by mouth daily at 12 noon., Disp: 90 tablet, Rfl: 4   Rimegepant Sulfate (NURTEC) 75 MG TBDP, Take 1 tablet (75 mg total) by mouth as needed., Disp: 4 tablet, Rfl: 0   traMADol  (ULTRAM ) 50 MG tablet, Take 1-2 tablets (50-100 mg total) by mouth every 6 (six) hours as needed for severe pain (pain score 7-10). after surgery, Disp: 180 tablet, Rfl: 3   TRINTELLIX  20 MG TABS,  Take 20 mg by mouth daily., Disp: , Rfl: 2   Ubrogepant  (UBRELVY ) 100 MG TABS, Take 1 tablet at the onset of a migraine can repeat in 2 hours if needed, Disp: 15 tablet, Rfl: 11   VOLTAREN 1 % GEL, APPLY 2 GRAMS TO KNEE 2-3 TIMES DAILY PRN, Disp: , Rfl: 1   zolmitriptan  (ZOMIG ) 5 MG nasal solution, PLACE 1 SPRAY INTO THE NOSE AS NEEDED FOR MIGRAINE (IN THE NOSTRIL AS DIRECTED)., Disp: 6 each, Rfl: 5   Medications ordered in this encounter:  Meds ordered this encounter  Medications   ondansetron  (ZOFRAN -ODT) 4 MG disintegrating tablet    Sig: Take 1 tablet (4 mg total) by mouth every 8 (eight) hours as needed for nausea or vomiting.    Dispense:  20 tablet    Refill:  0    Supervising Provider:   LAMPTEY, PHILIP O [[id number]]     *If you need refills on other medications prior to your next appointment, please contact your pharmacy*  Follow-Up: Call back or seek an in-person evaluation if the symptoms worsen or if the condition fails to improve as anticipated.  [facility] [phone]  Other Instructions  -adjustment of how Protonix  should be taken:              on an empty stomach an hour prior to eating if possible.  -use Tums if stomach is sour  (noted improvement after chewing Tums, during visit) -use Zofran  if Tums does not help.  -get appt with PCP moved up sooner. - seems to be willing to try Carafate  again- reduced dose? Maybe once in the morning to avoid constipation, does not seem to be a true allergy.     If you have been instructed to have an in-person evaluation today at a local Urgent Care facility, please use the link below. It will take you to a list of all of our available [facility], including address, phone number and hours of operation. Please do not delay care.  [facility]  If you or a family member do not have a primary care provider, use the link below to schedule a visit and establish care. When you choose a [facility] primary care physician or advanced practice provider, you gain a long-term partner in health. Find a Primary Care Provider  Learn more about [facility] in-office and virtual care options: [facility] - Get Care Now

## 2023-09-02 NOTE — Telephone Encounter (Signed)
 Agree with plan. Thank you

## 2023-09-12 DIAGNOSIS — M546 Pain in thoracic spine: Secondary | ICD-10-CM | POA: Diagnosis not present

## 2023-09-12 DIAGNOSIS — M545 Low back pain, unspecified: Secondary | ICD-10-CM | POA: Diagnosis not present

## 2023-09-24 ENCOUNTER — Telehealth: Payer: Self-pay | Admitting: Internal Medicine

## 2023-09-24 NOTE — Telephone Encounter (Signed)
Copied from CRM [id number]. Topic: Clinical - Medication Question >> Sep 24, 2023  9:17 AM Gurney Maxin H wrote: Reason for CRM: Patient is following up to a previous question she had for provider regarding the Evenity injection over the Prolia injection. Patient states provider was supposed to give her direction on what to do, please reach out to patient, thanks.  Florita [phone]

## 2023-09-26 ENCOUNTER — Telehealth: Payer: Self-pay

## 2023-09-26 ENCOUNTER — Other Ambulatory Visit (HOSPITAL_COMMUNITY): Payer: Self-pay

## 2023-09-26 DIAGNOSIS — M810 Age-related osteoporosis without current pathological fracture: Secondary | ICD-10-CM

## 2023-09-26 DIAGNOSIS — S22000A Wedge compression fracture of unspecified thoracic vertebra, initial encounter for closed fracture: Secondary | ICD-10-CM

## 2023-09-26 NOTE — Telephone Encounter (Signed)
New encounter created for EVENITY BIV

## 2023-09-26 NOTE — Telephone Encounter (Signed)
 Evenity VOB initiated via [email or web address]

## 2023-10-09 ENCOUNTER — Other Ambulatory Visit (HOSPITAL_COMMUNITY): Payer: Self-pay

## 2023-10-09 NOTE — Telephone Encounter (Addendum)
Pt ready for scheduling for Evenity on or after : 10/09/23  Out-of-pocket cost due at time of visit: $482  Number of injection/visits approved: --  Primary: [employer] - Medicare Evenity co-insurance: 20% Admin fee co-insurance: $0  Secondary: N/A Evenity co-insurance:  Admin fee co-insurance:   Medical Benefit Details: Date Benefits were checked: 10/09/23 Deductible: no/ Coinsurance: 20%/ Admin Fee: $0  Prior Auth: Not required PA# Expiration Date:  # of doses approved:  Pharmacy benefit: Copay $-- If patient wants fill through the pharmacy benefit please send prescription to:  -- , and include estimated need by date in rx notes. Pharmacy will ship medication directly to the office.  Patient not eligible for Evenity Copay Card. Copay Card can make patient's cost as little as $25. Link to apply: [email or web address]://www.amgensupportplus.com/copay  ** This summary of benefits is an estimation of the patient's out-of-pocket cost. Exact cost may very based on individual plan coverage.    Call reference: [phone]

## 2023-10-10 DIAGNOSIS — M546 Pain in thoracic spine: Secondary | ICD-10-CM | POA: Diagnosis not present

## 2023-10-11 NOTE — Telephone Encounter (Signed)
Is it the cost per month? Is it affordable for them? Thx

## 2023-10-15 ENCOUNTER — Ambulatory Visit: Payer: PPO | Admitting: Internal Medicine

## 2023-10-15 NOTE — Telephone Encounter (Signed)
I wonder if Evenity becomes free after they spent $2000 and 2025. If "yes" to the above, and if they can afford it-please start Evenity.  Thank you

## 2023-10-15 NOTE — Telephone Encounter (Signed)
Provider has asked "I wonder if [name] becomes free after they spent $2000 and 2025"

## 2023-10-20 NOTE — Telephone Encounter (Signed)
 I was able to speak with the pt and inform her of the following information in regards to her Evenity injections. Pt stated she was going to speak with her husband and discuss the information to see if she will go forth with the injection and cal [place] back with an update.  Pt ready for scheduling for Evenity on or after : 10/09/23   Out-of-pocket cost due at time of visit: $482 It should be covered at 100% once the patient reaches the max out of pocket at $2,000.

## 2023-10-23 ENCOUNTER — Ambulatory Visit: Payer: PPO

## 2023-10-23 VITALS — Ht 61.0 in | Wt 112.0 lb

## 2023-10-23 DIAGNOSIS — E118 Type 2 diabetes mellitus with unspecified complications: Secondary | ICD-10-CM

## 2023-10-23 DIAGNOSIS — Z0000 Encounter for general adult medical examination without abnormal findings: Secondary | ICD-10-CM | POA: Diagnosis not present
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
## 2023-10-23 NOTE — Patient Instructions (Addendum)
 Ms. Forcier , Thank you for taking time to come for your Medicare Wellness Visit. I appreciate your ongoing commitment to your health goals. Please review the following plan we discussed and let me know if I can assist you in the future.   Referrals/Orders/Follow-Ups/Clinician Recommendations: It was nice talking to you today.    This is a list of the screening recommended for you and due dates:  Health Maintenance  Topic Date Due   Colon Cancer Screening  12/04/2020   Medicare Annual Wellness Visit  04/25/2023   COVID-19 Vaccine (7 - 2024-25 season) 04/27/2023   Yearly kidney health urinalysis for diabetes  06/19/2023   Eye exam for diabetics  08/29/2023   Flu Shot  11/24/2023*   Hemoglobin A1C  02/11/2024   Complete foot exam   03/16/2024   Yearly kidney function blood test for diabetes  05/15/2024   DTaP/Tdap/Td vaccine (3 - Td or Tdap) 01/24/2032   Pneumonia Vaccine  Completed   DEXA scan (bone density measurement)  Completed   Zoster (Shingles) Vaccine  Completed   HPV Vaccine  Aged Out  *Topic was postponed. The date shown is not the original due date.    Advanced directives: (Copy Requested) Please bring a copy of your health care power of attorney and living will to the office to be added to your chart at your convenience.  Next Medicare Annual Wellness Visit scheduled for next year: Yes

## 2023-10-24 DIAGNOSIS — M545 Low back pain, unspecified: Secondary | ICD-10-CM | POA: Diagnosis not present

## 2023-10-30 ENCOUNTER — Encounter: Payer: Self-pay | Admitting: Family

## 2023-10-31 ENCOUNTER — Ambulatory Visit: Payer: PPO | Admitting: Internal Medicine

## 2023-11-05 ENCOUNTER — Other Ambulatory Visit: Payer: Self-pay | Admitting: Internal Medicine

## 2023-11-05 MED ORDER — FAMOTIDINE 40 MG PO TABS: 40.0000 mg | ORAL_TABLET | Freq: Every day | ORAL | 3 refills | Status: AC

## 2023-11-11 ENCOUNTER — Ambulatory Visit: Payer: PPO | Admitting: Endocrinology

## 2023-11-11 NOTE — Telephone Encounter (Signed)
 Called and spoke with patient. Informed me that she would be discussing this matter during her upcoming appointment with Dr.Plotnikov 11/17/23

## 2023-11-17 ENCOUNTER — Encounter: Payer: Self-pay | Admitting: Internal Medicine

## 2023-11-17 ENCOUNTER — Ambulatory Visit: Admitting: Internal Medicine

## 2023-11-17 VITALS — BP 126/80 | HR 105 | Temp 98.0°F | Ht 61.0 in | Wt 104.6 lb

## 2023-11-17 DIAGNOSIS — F411 Generalized anxiety disorder: Secondary | ICD-10-CM

## 2023-11-17 DIAGNOSIS — R413 Other amnesia: Secondary | ICD-10-CM

## 2023-11-17 DIAGNOSIS — M810 Age-related osteoporosis without current pathological fracture: Secondary | ICD-10-CM | POA: Diagnosis not present

## 2023-11-17 DIAGNOSIS — E1142 Type 2 diabetes mellitus with diabetic polyneuropathy: Secondary | ICD-10-CM

## 2023-11-17 DIAGNOSIS — E118 Type 2 diabetes mellitus with unspecified complications: Secondary | ICD-10-CM | POA: Diagnosis not present

## 2023-11-17 DIAGNOSIS — S22000A Wedge compression fracture of unspecified thoracic vertebra, initial encounter for closed fracture: Secondary | ICD-10-CM

## 2023-11-17 DIAGNOSIS — G8929 Other chronic pain: Secondary | ICD-10-CM

## 2023-11-17 DIAGNOSIS — F418 Other specified anxiety disorders: Secondary | ICD-10-CM

## 2023-11-17 DIAGNOSIS — I10 Essential (primary) hypertension: Secondary | ICD-10-CM | POA: Diagnosis not present

## 2023-11-17 DIAGNOSIS — N183 Chronic kidney disease, stage 3 unspecified: Secondary | ICD-10-CM | POA: Diagnosis not present

## 2023-11-17 DIAGNOSIS — M5440 Lumbago with sciatica, unspecified side: Secondary | ICD-10-CM

## 2023-11-17 DIAGNOSIS — R634 Abnormal weight loss: Secondary | ICD-10-CM | POA: Diagnosis not present

## 2023-11-17 LAB — CBC WITH DIFFERENTIAL/PLATELET
Basophils Absolute: 0.1 10*3/uL (ref 0.0–0.1)
Basophils Relative: 1.4 % (ref 0.0–3.0)
Eosinophils Absolute: 0.1 10*3/uL (ref 0.0–0.7)
Eosinophils Relative: 1.7 % (ref 0.0–5.0)
HCT: 37.2 % (ref 36.0–46.0)
Hemoglobin: 12.3 g/dL (ref 12.0–15.0)
Lymphocytes Relative: 36.5 % (ref 12.0–46.0)
Lymphs Abs: 3 10*3/uL (ref 0.7–4.0)
MCHC: 33.1 g/dL (ref 30.0–36.0)
MCV: 102.6 fl — ABNORMAL HIGH (ref 78.0–100.0)
Monocytes Absolute: 0.7 10*3/uL (ref 0.1–1.0)
Monocytes Relative: 8.3 % (ref 3.0–12.0)
Neutro Abs: 4.3 10*3/uL (ref 1.4–7.7)
Neutrophils Relative %: 52.1 % (ref 43.0–77.0)
Platelets: 423 10*3/uL — ABNORMAL HIGH (ref 150.0–400.0)
RBC: 3.62 Mil/uL — ABNORMAL LOW (ref 3.87–5.11)
RDW: 13 % (ref 11.5–15.5)
WBC: 8.2 10*3/uL (ref 4.0–10.5)

## 2023-11-17 LAB — COMPREHENSIVE METABOLIC PANEL
ALT: 52 U/L — ABNORMAL HIGH (ref 0–35)
AST: 39 U/L — ABNORMAL HIGH (ref 0–37)
Albumin: 4 g/dL (ref 3.5–5.2)
Alkaline Phosphatase: 94 U/L (ref 39–117)
BUN: 22 mg/dL (ref 6–23)
CO2: 32 meq/L (ref 19–32)
Calcium: 9.7 mg/dL (ref 8.4–10.5)
Chloride: 99 meq/L (ref 96–112)
Creatinine, Ser: 1.11 mg/dL (ref 0.40–1.20)
GFR: 46.38 mL/min — ABNORMAL LOW (ref >60.00)
Glucose, Bld: 191 mg/dL — ABNORMAL HIGH (ref 70–99)
Potassium: 4.5 meq/L (ref 3.5–5.1)
Sodium: 136 meq/L (ref 135–145)
Total Bilirubin: 0.4 mg/dL (ref 0.2–1.2)
Total Protein: 6.9 g/dL (ref 6.0–8.3)

## 2023-11-17 LAB — TSH: TSH: 1.14 u[IU]/mL (ref 0.35–5.50)

## 2023-11-17 LAB — T4, FREE: Free T4: 0.91 ng/dL (ref 0.60–1.60)

## 2023-11-17 LAB — HEMOGLOBIN A1C: Hgb A1c MFr Bld: 6.5 % (ref 4.6–6.5)

## 2023-11-17 MED ORDER — LORAZEPAM 1 MG PO TABS: 1.0000 mg | ORAL_TABLET | Freq: Three times a day (TID) | ORAL | 1 refills | Status: DC | PRN

## 2023-11-17 MED ORDER — ZOLMITRIPTAN 5 MG NA SOLN: 1.0000 | NASAL | 5 refills | Status: AC | PRN

## 2023-11-17 NOTE — Assessment & Plan Note (Signed)
 Re-start Derl Barrow, NP

## 2023-11-17 NOTE — Assessment & Plan Note (Signed)
 Chronic.

## 2023-11-17 NOTE — Assessment & Plan Note (Signed)
 Check BP at home

## 2023-11-17 NOTE — Progress Notes (Signed)
 Subjective:  Patient ID: [name], female    DOB: 04/07/1941  Age: 83 y.o. MRN: [id number]  CC: Medical Management of Chronic Issues (2 Month Follow Up. Discuss osteoporosis treatment options. Opinion getting flu shot this far into flu season)   HPI [name] presents for a f/u - DM, anxiety - worse, stress w/[place], LBP Here w/[name] - her dtr-in-law. We discussed senior living facility. F/u of osteoporosis  Outpatient Medications Prior to Visit  Medication Sig Dispense Refill   aspirin EC 81 MG tablet Take 1 tablet (81 mg total) by mouth daily.     BD PEN NEEDLE NANO U/F 32G X 4 MM MISC USE AS DIRECTED TWICE DAILY. 200 each 3   busPIRone (BUSPAR) 15 MG tablet Take 1 tablet by mouth 2 (two) times daily.     cetirizine (ZYRTEC) 10 MG tablet Take 10 mg by mouth daily.     cholecalciferol (VITAMIN D3) 25 MCG (1000 UNIT) tablet Take 1,000 Units by mouth daily.     Continuous Glucose Sensor (FREESTYLE LIBRE 3 PLUS SENSOR) MISC Change sensor every 15 days. 6 each 3   Continuous Glucose Sensor (FREESTYLE LIBRE 3 SENSOR) MISC 1 DEVICE BY DOES NOT APPLY ROUTE EVERY 14 (FOURTEEN) DAYS. APPLY 1 SENSOR ON UPPER ARM EVERY 14 DAYS FOR CONTINUOUS GLUCOSE MONITORING 5 each 1   cyclobenzaprine (FLEXERIL) 5 MG tablet Take 5 mg by mouth 3 (three) times daily as needed.     docusate sodium (COLACE) 100 MG capsule Take 1 capsule (100 mg total) by mouth 2 (two) times daily.     famotidine (PEPCID) 40 MG tablet Take 1 tablet (40 mg total) by mouth at bedtime. 90 tablet 3   insulin lispro (HUMALOG) 100 UNIT/ML injection Inject 4 Units into the skin 3 (three) times daily before meals. 3 units in the pm     L-Methylfolate (DEPLIN) 15 MG TABS Take 15 mg by mouth daily. For treatment of depression     Magnesium Hydroxide (MILK OF MAGNESIA PO) Take 1-2 capsules by mouth daily as needed.     ONETOUCH VERIO test strip Use as instructed to check blood sugar once a day 100 each 3   pantoprazole (PROTONIX) 40  MG tablet TAKE 1 TABLET BY MOUTH TWICE A DAY 180 tablet 3   polyethylene glycol powder (GLYCOLAX/MIRALAX) 17 GM/SCOOP powder Take 1 Container by mouth as needed.     repaglinide (PRANDIN) 1 MG tablet Take 1 tablet (1 mg total) by mouth daily at 12 noon. 90 tablet 4   Rimegepant Sulfate (NURTEC) 75 MG TBDP Take 1 tablet (75 mg total) by mouth as needed. 4 tablet 0   traMADol (ULTRAM) 50 MG tablet Take 1-2 tablets (50-100 mg total) by mouth every 6 (six) hours as needed for severe pain (pain score 7-10). after surgery 180 tablet 3   TRINTELLIX 20 MG TABS Take 20 mg by mouth daily.  2   VOLTAREN 1 % GEL APPLY 2 GRAMS TO KNEE 2-3 TIMES DAILY PRN  1   LORazepam (ATIVAN) 1 MG tablet Take 1 tablet (1 mg total) by mouth 2 (two) times daily as needed for anxiety. 180 tablet 1   Continuous Glucose Receiver (DEXCOM G6 RECEIVER) DEVI Use as directed (Patient not taking: Reported on 10/23/2023) 1 each 1   Continuous Glucose Sensor (DEXCOM G6 SENSOR) MISC Replace every 10 days (Patient not taking: Reported on 10/23/2023) 9 each 3   Continuous Glucose Transmitter (DEXCOM G6 TRANSMITTER) MISC Use as directed (  Patient not taking: Reported on 10/23/2023) 1 each 1   insulin aspart (NOVOLOG) 100 UNIT/ML injection Inject 0-5 Units into the skin at bedtime. (Patient not taking: Reported on 11/17/2023)     insulin aspart (NOVOLOG) 100 UNIT/ML injection Inject 0-9 Units into the skin 3 (three) times daily with meals. (Patient not taking: Reported on 11/17/2023)     losartan (COZAAR) 25 MG tablet Take 1 tablet (25 mg total) by mouth daily. (Patient not taking: Reported on 11/17/2023)     ondansetron (ZOFRAN-ODT) 4 MG disintegrating tablet Take 1 tablet (4 mg total) by mouth every 8 (eight) hours as needed for nausea or vomiting. (Patient not taking: Reported on 11/17/2023) 20 tablet 0   Ubrogepant (UBRELVY) 100 MG TABS Take 1 tablet at the onset of a migraine can repeat in 2 hours if needed (Patient not taking: Reported on  10/23/2023) 15 tablet 11   zolmitriptan (ZOMIG) 5 MG nasal solution PLACE 1 SPRAY INTO THE NOSE AS NEEDED FOR MIGRAINE (IN THE NOSTRIL AS DIRECTED). (Patient not taking: Reported on 11/17/2023) 6 each 5   No facility-administered medications prior to visit.    ROS: Review of Systems  Constitutional:  Negative for activity change, appetite change, chills, fatigue and unexpected weight change.  HENT:  Negative for congestion, mouth sores and sinus pressure.   Eyes:  Negative for visual disturbance.  Respiratory:  Negative for cough and chest tightness.   Gastrointestinal:  Negative for abdominal pain and nausea.  Genitourinary:  Negative for difficulty urinating, frequency and vaginal pain.  Musculoskeletal:  Positive for back pain and gait problem.  Skin:  Negative for pallor and rash.  Neurological:  Negative for dizziness, tremors, weakness, numbness and headaches.  Hematological:  Does not bruise/bleed easily.  Psychiatric/Behavioral:  Negative for confusion, sleep disturbance and suicidal ideas. The patient is nervous/anxious.     Objective:  BP 126/80   Pulse (!) 105   Temp 98 F (36.7 C)   Ht 5\' 1"  (1.549 m)   Wt 104 lb 9.6 oz (47.4 kg)   SpO2 92%   BMI 19.76 kg/m   BP Readings from Last 3 Encounters:  11/17/23 126/80  08/13/23 110/80  08/13/23 132/80    Wt Readings from Last 3 Encounters:  11/17/23 104 lb 9.6 oz (47.4 kg)  10/23/23 112 lb (50.8 kg)  08/13/23 112 lb (50.8 kg)    Physical Exam Constitutional:      General: She is not in acute distress.    Appearance: She is well-developed.  HENT:     Head: Normocephalic.     Right Ear: External ear normal.     Left Ear: External ear normal.     Nose: Nose normal.  Eyes:     General:        Right eye: No discharge.        Left eye: No discharge.     Conjunctiva/sclera: Conjunctivae normal.     Pupils: Pupils are equal, round, and reactive to light.  Neck:     Thyroid: No thyromegaly.     Vascular: No JVD.      Trachea: No tracheal deviation.  Cardiovascular:     Rate and Rhythm: Normal rate and regular rhythm.     Heart sounds: Normal heart sounds.  Pulmonary:     Effort: No respiratory distress.     Breath sounds: No stridor. No wheezing.  Abdominal:     General: Bowel sounds are normal. There is no distension.  Palpations: Abdomen is soft. There is no mass.     Tenderness: There is no abdominal tenderness. There is no guarding or rebound.  Musculoskeletal:        General: No tenderness.     Cervical back: Normal range of motion and neck supple. No rigidity.     Right lower leg: No edema.     Left lower leg: No edema.  Lymphadenopathy:     Cervical: No cervical adenopathy.  Skin:    Findings: No erythema or rash.  Neurological:     Cranial Nerves: No cranial nerve deficit.     Motor: No abnormal muscle tone.     Coordination: Coordination abnormal.     Gait: Gait abnormal.     Deep Tendon Reflexes: Reflexes normal.  Psychiatric:        Behavior: Behavior normal.        Thought Content: Thought content normal.        Judgment: Judgment normal.   Use a walker  Lab Results  Component Value Date   WBC 16.1 (H) 05/16/2023   HGB 7.5 (L) 05/16/2023   HCT 22.7 (L) 05/16/2023   PLT 300 05/16/2023   GLUCOSE 174 (H) 05/16/2023   CHOL 116 05/10/2020   TRIG 53.0 05/10/2020   HDL 71.30 05/10/2020   LDLDIRECT 48.0 06/18/2022   LDLCALC 34 05/10/2020   ALT 24 06/18/2022   AST 31 06/18/2022   NA 130 (L) 05/16/2023   K 4.2 05/16/2023   CL 96 (L) 05/16/2023   CREATININE 0.89 05/16/2023   BUN 19 05/16/2023   CO2 28 05/16/2023   TSH 0.89 12/10/2021   INR 1.0 05/12/2023   HGBA1C 6.4 (A) 08/13/2023   MICROALBUR 1.0 06/18/2022    DG HIP UNILAT WITH PELVIS 2-3 VIEWS RIGHT Result Date: 05/13/2023 CLINICAL DATA:  Femur fracture fixation EXAM: DG HIP (WITH OR WITHOUT PELVIS) 2-3V RIGHT COMPARISON:  Hip radiographs 1 day prior FINDINGS: Two C-arm fluoroscopic images were obtained  intraoperatively and submitted for post operative interpretation. The patient is status post intramedullary nail fixation of the proximal femoral fracture. Hardware alignment is within expected limits, without evidence of immediate complication. Femoroacetabular alignment is normal. Please see the performing provider's procedural report for further detail. IMPRESSION: Intraoperative images during intramedullary nail fixation of the proximal right femoral fracture. Electronically Signed   By: Lesia Hausen M.D.   On: 05/13/2023 18:28   DG C-Arm 1-60 Min-No Report Result Date: 05/13/2023 Fluoroscopy was utilized by the requesting physician.  No radiographic interpretation.   Chest Portable 1 View Result Date: 05/12/2023 CLINICAL DATA:  Preop, hip fracture. EXAM: PORTABLE CHEST 1 VIEW COMPARISON:  Chest radiograph 05/16/2016 FINDINGS: The cardiomediastinal contours are normal. Minor subsegmental scarring in the right lower lobe. Pulmonary vasculature is normal. No consolidation, pleural effusion, or pneumothorax. No acute osseous abnormalities are seen. IMPRESSION: No acute chest findings. Electronically Signed   By: Narda Rutherford M.D.   On: 05/12/2023 23:22   DG Hip Unilat W or Wo Pelvis 2-3 Views Right Result Date: 05/12/2023 CLINICAL DATA:  Right hip pain after fall. Deformity to the right hip EXAM: DG HIP (WITH OR WITHOUT PELVIS) 2-3V RIGHT COMPARISON:  CT 06/28/2021 FINDINGS: Mildly displaced right intertrochanteric hip fracture. There is medial displacement of the lesser trochanter. No dislocation of the hips. Screw fixation right SI joint. IMPRESSION: Right intertrochanteric hip fracture with displaced lesser trochanter. Electronically Signed   By: Minerva Fester M.D.   On: 05/12/2023 20:24    Assessment &  Plan:   Problem List Items Addressed This Visit     DM type 2, controlled, with complication (HCC) - Primary   F/u w/endocrinology - Dr Erroll Luna      Relevant Orders   CBC with  Differential/Platelet   Comprehensive metabolic panel   Hemoglobin A1c   Anxiety state   [name] Worse On prn Xanax - increase to tid due too stress  Potential benefits of a long term benzodiazepines  use as well as potential risks  and complications were explained to the patient and were aknowledged.      Relevant Medications   LORazepam (ATIVAN) 1 MG tablet   Other Relevant Orders   Comprehensive metabolic panel   Hemoglobin A1c   Depression with anxiety   Restart extracranial magnetic stimulation      Relevant Medications   LORazepam (ATIVAN) 1 MG tablet   Low back pain   Continue w/Tramadol prn  Potential benefits of a long term Tramadol/opioids use as well as potential risks (i.e. addiction risk, apnea etc) and complications (i.e. Somnolence, constipation and others) were explained to the patient and were aknowledged. Lions mane      Diabetic peripheral neuropathy associated with type 2 diabetes mellitus (HCC)   F/u w/Endo       Relevant Medications   LORazepam (ATIVAN) 1 MG tablet   Osteoporosis   Restart Prolia with Tina Griffiths, NP (GYN)       Memory problem   Chronic       CRI (chronic renal insufficiency), stage 3 (moderate) (HCC)   Monitor GFR      HTN (hypertension)    Check BP at home      Weight loss   Pt is on Victoza 0.6 mg/d per Dr [name] (tolerating OK: less "full" and still loosing wt) Pt lost 75 lbs total.  BMI 21 now  Wt Readings from Last 3 Encounters:  11/17/23 104 lb 9.6 oz (47.4 kg)  10/23/23 112 lb (50.8 kg)  08/13/23 112 lb (50.8 kg)         Relevant Orders   CBC with Differential/Platelet   TSH   T4, free   Compression fracture of body of thoracic vertebra (HCC)   Re-start Prolia, Tina Griffiths, NP         Meds ordered this encounter  Medications   LORazepam (ATIVAN) 1 MG tablet    Sig: Take 1 tablet (1 mg total) by mouth every 8 (eight) hours as needed for anxiety.    Dispense:  270 tablet    Refill:  1    zolmitriptan (ZOMIG) 5 MG nasal solution    Sig: Place 1 spray into the nose as needed for migraine (in the nostril as directed).    Dispense:  6 each    Refill:  5      Follow-up: Return in about 2 months (around 01/17/2024) for a follow-up visit.  Sonda Primes, MD

## 2023-11-17 NOTE — Assessment & Plan Note (Signed)
Restart extracranial magnetic stimulation

## 2023-11-17 NOTE — Assessment & Plan Note (Signed)
Continue w/Tramadol prn  Potential benefits of a long term Tramadol/opioids use as well as potential risks (i.e. addiction risk, apnea etc) and complications (i.e. Somnolence, constipation and others) were explained to the patient and were aknowledged. Lions mane

## 2023-11-17 NOTE — Assessment & Plan Note (Addendum)
 [name] Worse On prn Xanax - increase to tid due too stress  Potential benefits of a long term benzodiazepines  use as well as potential risks  and complications were explained to the patient and were aknowledged.

## 2023-11-17 NOTE — Assessment & Plan Note (Signed)
 Restart Prolia with Cheryl Griffiths, NP (GYN)

## 2023-11-17 NOTE — Assessment & Plan Note (Signed)
 F/u w/endocrinology - Dr Erroll Luna

## 2023-11-17 NOTE — Assessment & Plan Note (Signed)
 Monitor GFR

## 2023-11-17 NOTE — Assessment & Plan Note (Signed)
F/u w/Endo

## 2023-11-17 NOTE — Assessment & Plan Note (Signed)
 Pt is on Victoza 0.6 mg/d per Dr [name] (tolerating OK: less "full" and still loosing wt) Pt lost 75 lbs total.  BMI 21 now  Wt Readings from Last 3 Encounters:  11/17/23 104 lb 9.6 oz (47.4 kg)  10/23/23 112 lb (50.8 kg)  08/13/23 112 lb (50.8 kg)

## 2023-11-18 ENCOUNTER — Encounter: Payer: Self-pay | Admitting: Internal Medicine

## 2023-11-21 DIAGNOSIS — M546 Pain in thoracic spine: Secondary | ICD-10-CM | POA: Diagnosis not present

## 2023-11-26 ENCOUNTER — Ambulatory Visit: Admitting: Endocrinology

## 2023-11-28 ENCOUNTER — Telehealth: Payer: Self-pay | Admitting: Internal Medicine

## 2023-11-28 NOTE — Telephone Encounter (Signed)
 Copied from CRM [id number]. Topic: General - Other >> Nov 28, 2023 10:27 AM Arley Phenix D wrote: Reason for CRM: Misty Stanley from [facility] is calling regarding the patient's FL2's.Misty Stanley stated that they need to be signed and faxed back before the patient can move I to the facility.Contacted the office and they stated that the patient hasn't selected a facility as of yet and they haven't received the FL2's.   Misty Stanley stated that the patient has selected to stay at the [facility] facility and she will be faxing over the FL2's again today and stated that they need to be signed and faxed back today so the patient can move in.

## 2023-12-02 NOTE — Telephone Encounter (Signed)
 Copied from CRM [id number]. Topic: General - Other >> Dec 02, 2023  1:08 PM Eunice Blase wrote: Reason for CRM: Received call from [facility] per [place] ph: [phone] fax: [phone]. She needs the pt's FL2 form completed and signed.

## 2023-12-03 ENCOUNTER — Other Ambulatory Visit: Payer: Self-pay

## 2023-12-03 DIAGNOSIS — Z111 Encounter for screening for respiratory tuberculosis: Secondary | ICD-10-CM

## 2023-12-04 NOTE — Telephone Encounter (Signed)
 FL2 form has since been faxed out.

## 2023-12-08 ENCOUNTER — Other Ambulatory Visit

## 2023-12-08 DIAGNOSIS — Z111 Encounter for screening for respiratory tuberculosis: Secondary | ICD-10-CM | POA: Diagnosis not present

## 2023-12-09 ENCOUNTER — Ambulatory Visit: Admitting: Sports Medicine

## 2023-12-10 ENCOUNTER — Encounter: Payer: Self-pay | Admitting: Internal Medicine

## 2023-12-10 ENCOUNTER — Other Ambulatory Visit: Payer: Self-pay | Admitting: Internal Medicine

## 2023-12-10 ENCOUNTER — Telehealth: Payer: Self-pay | Admitting: Internal Medicine

## 2023-12-10 DIAGNOSIS — R7611 Nonspecific reaction to tuberculin skin test without active tuberculosis: Secondary | ICD-10-CM

## 2023-12-10 LAB — QUANTIFERON-TB GOLD PLUS
QuantiFERON Mitogen Value: >10 [IU]/mL
QuantiFERON Nil Value: 0.13 [IU]/mL
QuantiFERON TB1 Ag Value: 5.03 [IU]/mL
QuantiFERON TB2 Ag Value: 4.97 [IU]/mL
QuantiFERON-TB Gold Plus: POSITIVE — AB

## 2023-12-10 NOTE — Telephone Encounter (Signed)
 Patient called regarding her TB testing and would like a call back at [phone].

## 2023-12-10 NOTE — Telephone Encounter (Signed)
 Copied from CRM [id number]. Topic: Clinical - Lab/Test Results >> Dec 10, 2023  1:14 PM Danika B wrote: Reason for CRM: Patient and husband called in regarding TB test lab results as they are moving and need the results for their new senior living community. Patients chart showed positive results with a note from Dr. Autry Legions for only team staff to inform patient. Attempted to transfer to [facility] and clinical team but nurse unavailable, but will call patient back.

## 2023-12-16 ENCOUNTER — Telehealth: Payer: Self-pay | Admitting: Internal Medicine

## 2023-12-16 NOTE — Telephone Encounter (Signed)
 Copied from CRM [id number]. Topic: General - Other >> Dec 16, 2023 11:20 AM Freya Jesus wrote: Reason for CRM: [facility] Wellness Director at [facility] - Patient is moving in today and stated she had a lot of allergies but when provider filled out FL2 forms no allergies were listed. Needing a  copy of allergies. Also FL2 form missing last height and weight. Phone: [phone] - Fax: [phone]

## 2023-12-17 NOTE — Telephone Encounter (Unsigned)
 Copied from CRM [id number]. Topic: General - Other >> Dec 17, 2023 12:46 PM Dorisann Garre T wrote: Reason for CRM:  karanvir          [phone] is calling in regarding missing information for the patient medication nurtec

## 2023-12-18 ENCOUNTER — Telehealth: Payer: Self-pay | Admitting: Dietician

## 2023-12-18 ENCOUNTER — Other Ambulatory Visit: Payer: Self-pay

## 2023-12-18 ENCOUNTER — Telehealth: Payer: Self-pay

## 2023-12-18 DIAGNOSIS — E118 Type 2 diabetes mellitus with unspecified complications: Secondary | ICD-10-CM

## 2023-12-18 MED ORDER — INSULIN LISPRO 100 UNIT/ML IJ SOLN: INTRAMUSCULAR | 3 refills | Status: DC

## 2023-12-18 NOTE — Telephone Encounter (Signed)
 Patient's husband called.  Patient needs her Humalog  refilled.  Will send request to [facility].  Husband informed.  Cydne Doyne, RD, LDN, CDCES, DipACLM

## 2023-12-18 NOTE — Telephone Encounter (Signed)
 Patient newly at a nursing facility, parameters needed regarding patient's insulin . RN gave as directed by MD.

## 2023-12-18 NOTE — Telephone Encounter (Signed)
Spoke with patient, this has been taken care of.

## 2023-12-18 NOTE — Telephone Encounter (Signed)
 She is on fixed dose of Humalog  4 units for breakfast and 3 units for supper.  She is not on sliding scale.  They can call MD if the blood sugar is more than 400.

## 2023-12-19 NOTE — Telephone Encounter (Signed)
 Reached back to [employer] where they informed me this was regarding the approval of nurtec. I have since also sent over the updated FL2 form to [facility]

## 2023-12-22 ENCOUNTER — Other Ambulatory Visit (HOSPITAL_COMMUNITY): Payer: Self-pay

## 2023-12-22 ENCOUNTER — Other Ambulatory Visit: Payer: Self-pay

## 2023-12-22 ENCOUNTER — Other Ambulatory Visit: Payer: Self-pay | Admitting: *Deleted

## 2023-12-22 ENCOUNTER — Encounter: Payer: Self-pay | Admitting: Family Medicine

## 2023-12-22 ENCOUNTER — Ambulatory Visit: Admitting: Family Medicine

## 2023-12-22 ENCOUNTER — Encounter (HOSPITAL_COMMUNITY): Payer: Self-pay

## 2023-12-22 VITALS — BP 113/66 | Ht 61.0 in | Wt 106.0 lb

## 2023-12-22 DIAGNOSIS — M810 Age-related osteoporosis without current pathological fracture: Secondary | ICD-10-CM | POA: Diagnosis not present

## 2023-12-22 MED ORDER — ASSURE ID DUO PRO PEN NEEDLES 31G X 5 MM MISC
1.0000 | Freq: Every day | 24 refills | Status: AC
  Filled 2023-12-22: qty 30, fill #0

## 2023-12-22 MED ORDER — TYMLOS 3120 MCG/1.56ML ~~LOC~~ SOPN
80.0000 ug | PEN_INJECTOR | Freq: Every day | SUBCUTANEOUS | 12 refills | Status: AC
  Filled 2023-12-22: qty 1.56, fill #0

## 2023-12-22 NOTE — Progress Notes (Signed)
 Pharmacy Patient Advocate Encounter   Received notification from Patient Pharmacy that prior authorization for Tymlos is required/requested.   Insurance verification completed.   The patient is insured through [facility] ADVANTAGE/RX ADVANCE .   Per test claim: PA required; PA submitted to above mentioned insurance via CoverMyMeds Key/confirmation #/EOC [facility] Status is pending
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
## 2023-12-22 NOTE — Patient Instructions (Signed)
 Get labs after you leave today. We will look into the Evenity and Tymlos/Forteo for you and call you regarding the cost of each.

## 2023-12-23 ENCOUNTER — Other Ambulatory Visit: Payer: Self-pay

## 2023-12-23 ENCOUNTER — Telehealth: Payer: Self-pay | Admitting: Internal Medicine

## 2023-12-23 NOTE — Telephone Encounter (Signed)
 Copied from CRM [id number]. Topic: General - Other >> Dec 23, 2023 12:27 PM Adonis Hoot wrote: Reason for CRM: Caren Channel called stating that patient stated that she does PRN tylenol .In order for her to be able to take this medication an order has to be sent from provider .Patient stated that she mixes the tramadol  and tylenol . Patient stated that she took 1 500 tylenol  with 1 50 mg tramadol .She would like to continue to take this way,however Caren Channel would need the order for her to do so. Patient wants vitamin B complex and calcium  ,and PRN meclizine  as well.Stated that she was taking these medication while at home.

## 2023-12-23 NOTE — Progress Notes (Signed)
 Pharmacy Patient Advocate Encounter  Insurance verification completed.   The patient is insured through [facility] ADVANTAGE/RX ADVANCE   Ran test claim for Tymlos. Co-pay is $839.09.  Patient is eligible for Medicare payment plan.

## 2023-12-23 NOTE — Progress Notes (Signed)
 Pharmacy Patient Advocate Encounter  Received notification from [employer] that Prior Authorization for Tymlos has been APPROVED from 12/22/23 to 12/11/25   PA #/Case ID/Reference #: [id number]

## 2023-12-24 ENCOUNTER — Telehealth: Payer: Self-pay | Admitting: *Deleted

## 2023-12-24 NOTE — Telephone Encounter (Signed)
Okay to order as requested.  Thanks.

## 2023-12-24 NOTE — Telephone Encounter (Addendum)
 Patient is ready for scheduling on or after: 12/26/23 BUY AND BILL  Out-of-pocket cost due at time of visit: $502.75  (until her OOP max is met.)  Primary: [employer] [place] co-insurance: 20% (approximately 482.75) $20 copay  Deductible: n/a  Prior Auth: not required  Call ref #: [id number]; rep, Rosaura Comp  HTA phone #: [phone]    ** This summary of benefits is an estimation of the patient's out-of-pocket cost. Exact cost may vary based on individual plan coverage.

## 2023-12-29 ENCOUNTER — Encounter: Payer: Self-pay | Admitting: *Deleted

## 2023-12-29 DIAGNOSIS — M545 Low back pain, unspecified: Secondary | ICD-10-CM | POA: Diagnosis not present

## 2023-12-29 DIAGNOSIS — M810 Age-related osteoporosis without current pathological fracture: Secondary | ICD-10-CM | POA: Diagnosis not present

## 2023-12-30 DIAGNOSIS — Z7982 Long term (current) use of aspirin: Secondary | ICD-10-CM | POA: Diagnosis not present

## 2023-12-30 DIAGNOSIS — E1142 Type 2 diabetes mellitus with diabetic polyneuropathy: Secondary | ICD-10-CM | POA: Diagnosis not present

## 2023-12-30 DIAGNOSIS — E1165 Type 2 diabetes mellitus with hyperglycemia: Secondary | ICD-10-CM | POA: Diagnosis not present

## 2023-12-30 DIAGNOSIS — E1122 Type 2 diabetes mellitus with diabetic chronic kidney disease: Secondary | ICD-10-CM | POA: Diagnosis not present

## 2023-12-30 DIAGNOSIS — S22089D Unspecified fracture of T11-T12 vertebra, subsequent encounter for fracture with routine healing: Secondary | ICD-10-CM | POA: Diagnosis not present

## 2023-12-30 DIAGNOSIS — N183 Chronic kidney disease, stage 3 unspecified: Secondary | ICD-10-CM | POA: Diagnosis not present

## 2023-12-30 DIAGNOSIS — F32A Depression, unspecified: Secondary | ICD-10-CM | POA: Diagnosis not present

## 2023-12-30 DIAGNOSIS — I129 Hypertensive chronic kidney disease with stage 1 through stage 4 chronic kidney disease, or unspecified chronic kidney disease: Secondary | ICD-10-CM | POA: Diagnosis not present

## 2023-12-31 ENCOUNTER — Other Ambulatory Visit: Payer: Self-pay

## 2023-12-31 LAB — PROTEIN ELECTROPHORESIS, SERUM
A/G Ratio: 1.2 (ref 0.7–1.7)
Albumin ELP: 3.5 g/dL (ref 2.9–4.4)
Alpha 1: 0.3 g/dL (ref 0.0–0.4)
Alpha 2: 0.7 g/dL (ref 0.4–1.0)
Beta: 1.1 g/dL (ref 0.7–1.3)
Gamma Globulin: 0.8 g/dL (ref 0.4–1.8)
Globulin, Total: 2.9 g/dL (ref 2.2–3.9)
M-Spike, %: 0.3 g/dL — ABNORMAL HIGH
Total Protein: 6.4 g/dL (ref 6.0–8.5)

## 2023-12-31 LAB — VITAMIN D 25 HYDROXY (VIT D DEFICIENCY, FRACTURES): Vit D, 25-Hydroxy: 65 ng/mL (ref 30.0–100.0)

## 2023-12-31 NOTE — Progress Notes (Signed)
 See VOB encounter from 12/24/23. Even[place] through buy/bill would be cheaper for patient. Dis-enrolling.

## 2024-01-05 ENCOUNTER — Ambulatory Visit: Admitting: Endocrinology

## 2024-01-07 ENCOUNTER — Telehealth: Payer: Self-pay | Admitting: Internal Medicine

## 2024-01-07 NOTE — Telephone Encounter (Unsigned)
 Copied from CRM [id number]. Topic: Clinical - Medication Refill >> Jan 07, 2024 10:57 AM Allyne Areola wrote: Medication: LORazepam  (ATIVAN ) 1 MG tablet  Has the patient contacted their pharmacy? No, assisted living facility is calling to place the request.  (Agent: If no, request that the patient contact the pharmacy for the refill. If patient does not wish to contact the pharmacy document the reason why and proceed with request.) (Agent: If yes, when and what did the pharmacy advise?)  This is the patient's preferred pharmacy:  [facility]  [address], [place], [place] [address]  Is this the correct pharmacy for this prescription? Yes If no, delete pharmacy and type the correct one.   Has the prescription been filled recently? No  Is the patient out of the medication? Yes  Has the patient been seen for an appointment in the last year OR does the patient have an upcoming appointment? Yes  Can we respond through MyChart? Yes  Agent: Please be advised that Rx refills may take up to 3 business days. We ask that you follow-up with your pharmacy.

## 2024-01-09 MED ORDER — LORAZEPAM 1 MG PO TABS: 1.0000 mg | ORAL_TABLET | Freq: Three times a day (TID) | ORAL | 1 refills | Status: DC | PRN

## 2024-01-09 NOTE — Telephone Encounter (Signed)
 Okay.  I printed the prescription.  I was unable to find the pharmacy on the list.  Please fax.  Thanks

## 2024-01-09 NOTE — Telephone Encounter (Signed)
 I was able to fax over the rx to [facility] in [place]

## 2024-01-13 ENCOUNTER — Ambulatory Visit: Admitting: Endocrinology

## 2024-01-13 ENCOUNTER — Encounter: Payer: Self-pay | Admitting: Endocrinology

## 2024-01-13 ENCOUNTER — Other Ambulatory Visit: Payer: Self-pay

## 2024-01-13 ENCOUNTER — Ambulatory Visit: Payer: Self-pay | Admitting: Endocrinology

## 2024-01-13 ENCOUNTER — Telehealth: Payer: Self-pay

## 2024-01-13 VITALS — BP 136/80 | HR 101 | Resp 20 | Ht 61.0 in | Wt 106.2 lb

## 2024-01-13 DIAGNOSIS — Z794 Long term (current) use of insulin: Secondary | ICD-10-CM | POA: Diagnosis not present

## 2024-01-13 DIAGNOSIS — E119 Type 2 diabetes mellitus without complications: Secondary | ICD-10-CM

## 2024-01-13 DIAGNOSIS — E118 Type 2 diabetes mellitus with unspecified complications: Secondary | ICD-10-CM | POA: Diagnosis not present

## 2024-01-13 LAB — GLUCOSE, POCT (MANUAL RESULT ENTRY): POC Glucose: 148 mg/dL — AB (ref 70–99)

## 2024-01-13 MED ORDER — INSULIN LISPRO (1 UNIT DIAL) 100 UNIT/ML (KWIKPEN)
PEN_INJECTOR | SUBCUTANEOUS | 11 refills | Status: DC
Start: 2024-01-13 — End: 2024-01-14

## 2024-01-13 MED ORDER — FREESTYLE LIBRE 3 SENSOR MISC: 1.0000 | 1 refills | Status: AC

## 2024-01-13 MED ORDER — REPAGLINIDE 1 MG PO TABS
1.0000 mg | ORAL_TABLET | Freq: Every day | ORAL | 4 refills | Status: DC
Start: 2024-01-13 — End: 2024-04-12

## 2024-01-13 NOTE — Telephone Encounter (Signed)
 [facility] called needing clarification to order sent by MD

## 2024-01-13 NOTE — Telephone Encounter (Signed)
 Diabetes regimen  Take Humalog  before breakfast as follows :  Glucose < 100 : 0 units.  Glucose 100-150 : 3 units  Glucose > 150 : 5 units  Take Humalog  before supper as follows :  Glucose < 100 : 0 units.  Glucose > 100 : 2 units  Continue Prandin  1 mg with lunch.   Iraq Eyvonne Burchfield, MD [facility] Endocrinology [facility] [address] [place], [address] [place], [place] [address] Phone # [phone]
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
## 2024-01-13 NOTE — Patient Instructions (Addendum)
 Diabetes regimen  Adjust Humalog  3 to 5 units for breakfast ( small BF 3 units and full/large BF 5 units) and decrease Humalog  to 2 units for supper.  Do not take Humalog  with breakfast and supper if blood sugar is < 100 before eating.  Continue Prandin /repaglinide  1 mg with lunch.

## 2024-01-14 ENCOUNTER — Other Ambulatory Visit: Payer: Self-pay

## 2024-01-14 DIAGNOSIS — E118 Type 2 diabetes mellitus with unspecified complications: Secondary | ICD-10-CM

## 2024-01-14 LAB — MICROALBUMIN / CREATININE URINE RATIO
Creatinine, Urine: 71 mg/dL (ref 20–275)
Microalb Creat Ratio: 14 mg/g{creat} (ref <30)
Microalb, Ur: 1 mg/dL

## 2024-01-14 MED ORDER — INSULIN LISPRO (1 UNIT DIAL) 100 UNIT/ML (KWIKPEN): PEN_INJECTOR | SUBCUTANEOUS | 11 refills | Status: DC

## 2024-01-15 ENCOUNTER — Other Ambulatory Visit: Payer: Self-pay

## 2024-01-15 DIAGNOSIS — E119 Type 2 diabetes mellitus without complications: Secondary | ICD-10-CM

## 2024-01-15 MED ORDER — FREESTYLE LIBRE 3 PLUS SENSOR MISC: 3 refills | Status: DC

## 2024-01-21 ENCOUNTER — Ambulatory Visit (INDEPENDENT_AMBULATORY_CARE_PROVIDER_SITE_OTHER): Admitting: Internal Medicine

## 2024-01-21 ENCOUNTER — Encounter: Payer: Self-pay | Admitting: Internal Medicine

## 2024-01-21 VITALS — BP 118/76 | HR 93 | Temp 97.9°F | Ht 61.0 in | Wt 105.0 lb

## 2024-01-21 DIAGNOSIS — R3915 Urgency of urination: Secondary | ICD-10-CM | POA: Insufficient documentation

## 2024-01-21 DIAGNOSIS — E118 Type 2 diabetes mellitus with unspecified complications: Secondary | ICD-10-CM

## 2024-01-21 DIAGNOSIS — E119 Type 2 diabetes mellitus without complications: Secondary | ICD-10-CM

## 2024-01-21 DIAGNOSIS — F418 Other specified anxiety disorders: Secondary | ICD-10-CM | POA: Diagnosis not present

## 2024-01-21 DIAGNOSIS — M5440 Lumbago with sciatica, unspecified side: Secondary | ICD-10-CM

## 2024-01-21 DIAGNOSIS — G8929 Other chronic pain: Secondary | ICD-10-CM | POA: Diagnosis not present

## 2024-01-21 DIAGNOSIS — F411 Generalized anxiety disorder: Secondary | ICD-10-CM

## 2024-01-21 DIAGNOSIS — R7989 Other specified abnormal findings of blood chemistry: Secondary | ICD-10-CM

## 2024-01-21 DIAGNOSIS — Z794 Long term (current) use of insulin: Secondary | ICD-10-CM

## 2024-01-21 DIAGNOSIS — R5383 Other fatigue: Secondary | ICD-10-CM

## 2024-01-21 LAB — CBC WITH DIFFERENTIAL/PLATELET
Basophils Absolute: 0.1 10*3/uL (ref 0.0–0.1)
Basophils Relative: 1.4 % (ref 0.0–3.0)
Eosinophils Absolute: 0.1 10*3/uL (ref 0.0–0.7)
Eosinophils Relative: 1.7 % (ref 0.0–5.0)
HCT: 35.5 % — ABNORMAL LOW (ref 36.0–46.0)
Hemoglobin: 11.9 g/dL — ABNORMAL LOW (ref 12.0–15.0)
Lymphocytes Relative: 32.6 % (ref 12.0–46.0)
Lymphs Abs: 2.4 10*3/uL (ref 0.7–4.0)
MCHC: 33.5 g/dL (ref 30.0–36.0)
MCV: 100.9 fl — ABNORMAL HIGH (ref 78.0–100.0)
Monocytes Absolute: 0.6 10*3/uL (ref 0.1–1.0)
Monocytes Relative: 7.6 % (ref 3.0–12.0)
Neutro Abs: 4.2 10*3/uL (ref 1.4–7.7)
Neutrophils Relative %: 56.7 % (ref 43.0–77.0)
Platelets: 427 10*3/uL — ABNORMAL HIGH (ref 150.0–400.0)
RBC: 3.52 Mil/uL — ABNORMAL LOW (ref 3.87–5.11)
RDW: 12.6 % (ref 11.5–15.5)
WBC: 7.4 10*3/uL (ref 4.0–10.5)

## 2024-01-21 LAB — URINALYSIS
Bilirubin Urine: NEGATIVE
Hgb urine dipstick: NEGATIVE
Ketones, ur: NEGATIVE
Leukocytes,Ua: NEGATIVE
Nitrite: NEGATIVE
Specific Gravity, Urine: 1.015 (ref 1.000–1.030)
Total Protein, Urine: NEGATIVE
Urine Glucose: NEGATIVE
Urobilinogen, UA: 0.2 (ref 0.0–1.0)
pH: 6 (ref 5.0–8.0)

## 2024-01-21 LAB — COMPREHENSIVE METABOLIC PANEL WITH GFR
ALT: 44 U/L — ABNORMAL HIGH (ref 0–35)
AST: 37 U/L (ref 0–37)
Albumin: 4.1 g/dL (ref 3.5–5.2)
Alkaline Phosphatase: 142 U/L — ABNORMAL HIGH (ref 39–117)
BUN: 24 mg/dL — ABNORMAL HIGH (ref 6–23)
CO2: 30 meq/L (ref 19–32)
Calcium: 9.8 mg/dL (ref 8.4–10.5)
Chloride: 96 meq/L (ref 96–112)
Creatinine, Ser: 1.1 mg/dL (ref 0.40–1.20)
GFR: 46.82 mL/min — ABNORMAL LOW (ref >60.00)
Glucose, Bld: 224 mg/dL — ABNORMAL HIGH (ref 70–99)
Potassium: 4.1 meq/L (ref 3.5–5.1)
Sodium: 132 meq/L — ABNORMAL LOW (ref 135–145)
Total Bilirubin: 0.4 mg/dL (ref 0.2–1.2)
Total Protein: 7.1 g/dL (ref 6.0–8.3)

## 2024-01-21 LAB — T4, FREE: Free T4: 0.93 ng/dL (ref 0.60–1.60)

## 2024-01-21 LAB — TSH: TSH: 1.01 u[IU]/mL (ref 0.35–5.50)

## 2024-01-21 NOTE — Assessment & Plan Note (Signed)
 Monitor LFTs

## 2024-01-21 NOTE — Progress Notes (Signed)
 Subjective:  Patient ID: [name], female    DOB: [birth year]  Age: 83 y.o. MRN: [id number]  CC: Medical Management of Chronic Issues (2 MNTH F/U, Pt is having dizzy spells x1 week. Pt has a headache and dizziness today... Pt has concerns about feet swelling daily in the evenings, foggy headed, when needing to urinate she notices its slow or drips and not a stream and fatigue...//Blood Sugars have been not stable as well.)   HPI [name] presents for weakness, dizziness, slow urination C/o ankle edema,  [name] moved to [place] She is here w/dtr in law - [name] CBGs 50-300  Outpatient Medications Prior to Visit  Medication Sig Dispense Refill   Abaloparatide  (TYMLOS ) 3120 MCG/1.56ML SOPN Inject 80 mcg into the skin daily. 1.56 mL 12   aspirin  EC 81 MG tablet Take 1 tablet (81 mg total) by mouth daily.     BD PEN NEEDLE NANO U/F 32G X 4 MM MISC USE AS DIRECTED TWICE DAILY. 200 each 3   busPIRone  (BUSPAR ) 15 MG tablet Take 1 tablet by mouth 2 (two) times daily.     cetirizine (ZYRTEC) 10 MG tablet Take 10 mg by mouth daily.     cholecalciferol (VITAMIN D3) 25 MCG (1000 UNIT) tablet Take 1,000 Units by mouth daily.     Continuous Glucose Sensor (FREESTYLE LIBRE 3 PLUS SENSOR) MISC Change sensor every 15 days. 6 each 3   Continuous Glucose Sensor (FREESTYLE LIBRE 3 SENSOR) MISC 1 Device by Does not apply route every 14 (fourteen) days. Apply 1 sensor on upper arm every 14 days for continuous glucose monitoring 5 each 1   cyclobenzaprine  (FLEXERIL ) 5 MG tablet Take 5 mg by mouth 3 (three) times daily as needed.     docusate sodium  (COLACE) 100 MG capsule Take 1 capsule (100 mg total) by mouth 2 (two) times daily.     famotidine  (PEPCID ) 40 MG tablet Take 1 tablet (40 mg total) by mouth at bedtime. 90 tablet 3   insulin  lispro (HUMALOG  KWIKPEN) 100 UNIT/ML KwikPen Glucose < 100 : 0 units.   Glucose 100-150 : 3 units   Glucose > 150 : 5 units   Take Humalog  before  supper as follows :   Glucose < 100 : 0 units.   Glucose > 100 : 2 units   Continue Prandin  1 mg with lunch. 15 mL 11   Insulin  Pen Needle (ASSURE ID DUO PRO PEN NEEDLES) 31G X 5 MM MISC 1 each by Does not apply route daily. 30 each 24   L-Methylfolate (DEPLIN) 15 MG TABS Take 15 mg by mouth daily. For treatment of depression     LORazepam  (ATIVAN ) 1 MG tablet Take 1 tablet (1 mg total) by mouth every 8 (eight) hours as needed for anxiety. 270 tablet 1   Magnesium Hydroxide (MILK OF MAGNESIA PO) Take 1-2 capsules by mouth daily as needed.     ONETOUCH VERIO test strip Use as instructed to check blood sugar once a day 100 each 3   pantoprazole  (PROTONIX ) 40 MG tablet TAKE 1 TABLET BY MOUTH TWICE A DAY 180 tablet 3   polyethylene glycol powder (GLYCOLAX/MIRALAX) 17 GM/SCOOP powder Take 1 Container by mouth as needed.     RA ACETAMINOPHEN  EX ST 500 MG tablet Take 500 mg by mouth every 6 (six) hours as needed.     repaglinide  (PRANDIN ) 1 MG tablet Take 1 tablet (1 mg total) by mouth daily at 12 noon. 90  tablet 4   Rimegepant Sulfate (NURTEC) 75 MG TBDP Take 1 tablet (75 mg total) by mouth as needed. 4 tablet 0   traMADol  (ULTRAM ) 50 MG tablet Take 1-2 tablets (50-100 mg total) by mouth every 6 (six) hours as needed for severe pain (pain score 7-10). after surgery 180 tablet 3   TRINTELLIX  20 MG TABS Take 20 mg by mouth daily.  2   VOLTAREN 1 % GEL APPLY 2 GRAMS TO KNEE 2-3 TIMES DAILY PRN  1   zolmitriptan  (ZOMIG ) 5 MG nasal solution Place 1 spray into the nose as needed for migraine (in the nostril as directed). 6 each 5   No facility-administered medications prior to visit.    ROS: Review of Systems  Constitutional:  Negative for activity change, appetite change, chills, fatigue and unexpected weight change.  HENT:  Negative for congestion, mouth sores and sinus pressure.   Eyes:  Negative for visual disturbance.  Respiratory:  Negative for cough and chest tightness.   Gastrointestinal:   Negative for abdominal pain and nausea.  Genitourinary:  Negative for difficulty urinating, frequency and vaginal pain.  Musculoskeletal:  Positive for arthralgias and back pain. Negative for gait problem.  Skin:  Negative for pallor and rash.  Neurological:  Negative for dizziness, tremors, weakness, numbness and headaches.  Hematological:  Does not bruise/bleed easily.  Psychiatric/Behavioral:  Positive for decreased concentration and dysphoric mood. Negative for confusion, sleep disturbance and suicidal ideas. The patient is nervous/anxious.     Objective:  BP 118/76   Pulse 93   Temp 97.9 F (36.6 C) (Oral)   Ht 5\' 1"  (1.549 m)   Wt 105 lb (47.6 kg)   SpO2 98%   BMI 19.84 kg/m   BP Readings from Last 3 Encounters:  01/21/24 118/76  01/13/24 136/80  12/22/23 113/66    Wt Readings from Last 3 Encounters:  01/21/24 105 lb (47.6 kg)  01/13/24 106 lb 3.2 oz (48.2 kg)  12/22/23 106 lb (48.1 kg)    Physical Exam Constitutional:      General: She is not in acute distress.    Appearance: She is well-developed. She is obese.  HENT:     Head: Normocephalic.     Right Ear: External ear normal.     Left Ear: External ear normal.     Nose: Nose normal.  Eyes:     General:        Right eye: No discharge.        Left eye: No discharge.     Conjunctiva/sclera: Conjunctivae normal.     Pupils: Pupils are equal, round, and reactive to light.  Neck:     Thyroid : No thyromegaly.     Vascular: No JVD.     Trachea: No tracheal deviation.  Cardiovascular:     Rate and Rhythm: Normal rate and regular rhythm.     Heart sounds: Normal heart sounds.  Pulmonary:     Effort: No respiratory distress.     Breath sounds: No stridor. No wheezing.  Abdominal:     General: Bowel sounds are normal. There is no distension.     Palpations: Abdomen is soft. There is no mass.     Tenderness: There is no abdominal tenderness. There is no guarding or rebound.  Musculoskeletal:        General:  No tenderness.     Cervical back: Normal range of motion and neck supple. No rigidity.  Lymphadenopathy:     Cervical: No cervical adenopathy.  Skin:    Findings: No erythema or rash.  Neurological:     Cranial Nerves: No cranial nerve deficit.     Motor: No abnormal muscle tone.     Coordination: Coordination normal.     Deep Tendon Reflexes: Reflexes normal.  Psychiatric:        Behavior: Behavior normal.        Thought Content: Thought content normal.        Judgment: Judgment normal.     Lab Results  Component Value Date   WBC 8.2 11/17/2023   HGB 12.3 11/17/2023   HCT 37.2 11/17/2023   PLT 423.0 (H) 11/17/2023   GLUCOSE 191 (H) 11/17/2023   CHOL 116 05/10/2020   TRIG 53.0 05/10/2020   HDL 71.30 05/10/2020   LDLDIRECT 48.0 06/18/2022   LDLCALC 34 05/10/2020   ALT 52 (H) 11/17/2023   AST 39 (H) 11/17/2023   NA 136 11/17/2023   K 4.5 11/17/2023   CL 99 11/17/2023   CREATININE 1.11 11/17/2023   BUN 22 11/17/2023   CO2 32 11/17/2023   TSH 1.14 11/17/2023   INR 1.0 05/12/2023   HGBA1C 6.5 11/17/2023   MICROALBUR 1.0 01/13/2024    DG HIP UNILAT WITH PELVIS 2-3 VIEWS RIGHT Result Date: 05/13/2023 CLINICAL DATA:  Femur fracture fixation EXAM: DG HIP (WITH OR WITHOUT PELVIS) 2-3V RIGHT COMPARISON:  Hip radiographs 1 day prior FINDINGS: Two C-arm fluoroscopic images were obtained intraoperatively and submitted for post operative interpretation. The patient is status post intramedullary nail fixation of the proximal femoral fracture. Hardware alignment is within expected limits, without evidence of immediate complication. Femoroacetabular alignment is normal. Please see the performing provider's procedural report for further detail. IMPRESSION: Intraoperative images during intramedullary nail fixation of the proximal right femoral fracture. Electronically Signed   By: Eldora Greet M.D.   On: 05/13/2023 18:28   DG C-Arm 1-60 Min-No Report Result Date: 05/13/2023 Fluoroscopy  was utilized by the requesting physician.  No radiographic interpretation.   Chest Portable 1 View Result Date: 05/12/2023 CLINICAL DATA:  Preop, hip fracture. EXAM: PORTABLE CHEST 1 VIEW COMPARISON:  Chest radiograph 05/16/2016 FINDINGS: The cardiomediastinal contours are normal. Minor subsegmental scarring in the right lower lobe. Pulmonary vasculature is normal. No consolidation, pleural effusion, or pneumothorax. No acute osseous abnormalities are seen. IMPRESSION: No acute chest findings. Electronically Signed   By: Chadwick Colonel M.D.   On: 05/12/2023 23:22   DG Hip Unilat W or Wo Pelvis 2-3 Views Right Result Date: 05/12/2023 CLINICAL DATA:  Right hip pain after fall. Deformity to the right hip EXAM: DG HIP (WITH OR WITHOUT PELVIS) 2-3V RIGHT COMPARISON:  CT 06/28/2021 FINDINGS: Mildly displaced right intertrochanteric hip fracture. There is medial displacement of the lesser trochanter. No dislocation of the hips. Screw fixation right SI joint. IMPRESSION: Right intertrochanteric hip fracture with displaced lesser trochanter. Electronically Signed   By: Rozell Cornet M.D.   On: 05/12/2023 20:24    Assessment & Plan:   Problem List Items Addressed This Visit     DM type 2, controlled, with complication (HCC) - Primary   F/u w/endocrinology - Dr Aretha Kubas Using [place] 2 Use SS      Relevant Orders   Urinalysis   TSH   T4, free   Comprehensive metabolic panel with GFR   CBC with Differential/Platelet   Anxiety state    Worse On prn Xanax - increase to tid due too stress  Potential benefits of a long term benzodiazepines  use  as well as potential risks  and complications were explained to the patient and were aknowledged.      Relevant Orders   Urinalysis   TSH   T4, free   Depression with anxiety   Restart extracranial magnetic stimulation      Relevant Orders   Urinalysis   TSH   T4, free   Comprehensive metabolic panel with GFR   CBC with Differential/Platelet    Elevated liver function tests   Monitor LFTs      Fatigue   CFS      Low back pain   Dr Suzan Erm      Relevant Medications   RA ACETAMINOPHEN  EX ST 500 MG tablet   Insulin  dependent type 2 diabetes mellitus (HCC)   On Insulin  SS now      Urgency of urination   Check UA         No orders of the defined types were placed in this encounter.     Follow-up: Return in about 3 months (around 04/22/2024) for a follow-up visit.  Anitra Barn, MD

## 2024-01-21 NOTE — Assessment & Plan Note (Signed)
  Worse On prn Xanax - increase to tid due too stress  Potential benefits of a long term benzodiazepines  use as well as potential risks  and complications were explained to the patient and were aknowledged.

## 2024-01-21 NOTE — Assessment & Plan Note (Signed)
 Dr Suzan Erm

## 2024-01-21 NOTE — Assessment & Plan Note (Signed)
 CFS

## 2024-01-21 NOTE — Assessment & Plan Note (Signed)
 Check UA

## 2024-01-21 NOTE — Assessment & Plan Note (Signed)
Restart extracranial magnetic stimulation

## 2024-01-21 NOTE — Assessment & Plan Note (Signed)
 On Insulin  SS now

## 2024-01-21 NOTE — Assessment & Plan Note (Signed)
 F/u w/endocrinology - Dr Aretha Kubas Using [place] 2 Use SS

## 2024-01-22 ENCOUNTER — Telehealth: Payer: Self-pay | Admitting: Internal Medicine

## 2024-01-22 NOTE — Telephone Encounter (Signed)
 Copied from CRM [id number]. Topic: Clinical - Medical Advice >> Jan 22, 2024 10:56 AM Albertha Alosa wrote: Reason for CRM: Patient husband called in stating she saw Dr.Shane Hudnall , and that he wanted to start giving her a injection called Evenity, wanted to know if Dr.Plotnikov does that , would like for someone to give them a call back to explain what's she soul do next    [phone]

## 2024-01-26 ENCOUNTER — Ambulatory Visit: Payer: Self-pay | Admitting: Internal Medicine

## 2024-01-28 ENCOUNTER — Ambulatory Visit: Admitting: Family Medicine

## 2024-01-28 DIAGNOSIS — M810 Age-related osteoporosis without current pathological fracture: Secondary | ICD-10-CM

## 2024-01-28 MED ORDER — ROMOSOZUMAB-AQQG 105 MG/1.17ML ~~LOC~~ SOSY
210.0000 mg | PREFILLED_SYRINGE | Freq: Once | SUBCUTANEOUS | Status: AC
  Administered 2024-01-28: 210 mg via SUBCUTANEOUS

## 2024-01-28 NOTE — Telephone Encounter (Signed)
 Pt was seen by sports medicine on 01/28/2024 and given her Evenity injection. Sports Med will be administering this medication for the pt.

## 2024-01-28 NOTE — Progress Notes (Signed)
 Patient is here for evenity injection #1. Patient received bilateral arm  evenity injections today. She tolerated injections well. Patient waited 15 mins to make sure no bad reaction to injection. She will return in 1 month for her next evenity injection.

## 2024-02-02 ENCOUNTER — Ambulatory Visit: Payer: Self-pay

## 2024-02-02 ENCOUNTER — Ambulatory Visit (INDEPENDENT_AMBULATORY_CARE_PROVIDER_SITE_OTHER): Admitting: Family Medicine

## 2024-02-02 ENCOUNTER — Encounter: Payer: Self-pay | Admitting: Family Medicine

## 2024-02-02 VITALS — BP 142/90 | HR 77 | Temp 98.0°F | Ht 61.0 in | Wt 104.5 lb

## 2024-02-02 DIAGNOSIS — R300 Dysuria: Secondary | ICD-10-CM | POA: Insufficient documentation

## 2024-02-02 DIAGNOSIS — R3911 Hesitancy of micturition: Secondary | ICD-10-CM

## 2024-02-02 LAB — POC URINALSYSI DIPSTICK (AUTOMATED)
Bilirubin, UA: NEGATIVE
Blood, UA: NEGATIVE
Glucose, UA: NEGATIVE
Ketones, UA: NEGATIVE
Leukocytes, UA: NEGATIVE
Nitrite, UA: NEGATIVE
Protein, UA: NEGATIVE
Spec Grav, UA: 1.015 (ref 1.010–1.025)
Urobilinogen, UA: 0.2 U/dL
pH, UA: 6 (ref 5.0–8.0)

## 2024-02-02 MED ORDER — CEPHALEXIN 500 MG PO CAPS: 500.0000 mg | ORAL_CAPSULE | Freq: Two times a day (BID) | ORAL | 0 refills | Status: AC

## 2024-02-02 NOTE — Patient Instructions (Signed)
 Your urine was concerning for infection in the office today. We have sent it to the lab for culture and confirmation.   We will be in contact with results once they are received.   I have sent in keflex for you to take twice a day for 5 days. Please eat when you take this medication, it can upset your stomach if you do not. Please be sure to complete the course of antibiotics even if you are feeling better.    Follow-up with me for new or worsening symptoms.

## 2024-02-02 NOTE — Telephone Encounter (Signed)
 FYI Only or Action Required?: Action required by provider  Patient was last seen in primary care on 01/21/2024 by Plotnikov, Oakley Bellman, MD. Called Nurse Triage reporting Urinary Frequency. Symptoms began several days ago. Interventions attempted: Nothing. Symptoms are: unchanged.  Triage Disposition: see pcp within 24 hours  Patient/caregiver understands and will follow disposition?: Copied from CRM [id number]. Topic: Clinical - Red Word Triage >> Feb 02, 2024 10:27 AM [name] wrote: Red Word that prompted transfer to Nurse Triage: Patient has vaginal burning and slow when she tries to urinate and the patient calling to see if she could get a urine sample taken today Reason for Disposition  Bad or foul-smelling urine  Answer Assessment - Initial Assessment Questions 1. SYMPTOM: "What's the main symptom you're concerned about?" (e.g., frequency, incontinence)     Burning  2. ONSET: "When did the    start?"     Over weekend  3. PAIN: "Is there any pain?" If Yes, ask: "How bad is it?" (Scale: 1-10; mild, moderate, severe)     Denies  4. CAUSE: "What do you think is causing the symptoms?"     Possible UTI  5. OTHER SYMPTOMS: "Do you have any other symptoms?" (e.g., blood in urine, fever, flank pain, pain with urination)     retention     Pt in assisted living- trying coordinating transportation and may have to reschedule.  Protocols used: Urinary Symptoms-A-AH

## 2024-02-02 NOTE — Progress Notes (Signed)
   Acute Office Visit  Subjective:     Patient ID: [name], female    DOB: 30-Aug-1940, 83 y.o.   MRN: [id number]  Chief Complaint  Patient presents with   Urinary Tract Infection    Symptoms of UTI, burning sensation, difficulty to urinate, oligura    Urinary Tract Infection    Patient is in today for evaluation of dysuria, urinary hesitancy, for the last week. Has tried increasing fluids and drinking cranberry juice. Uses depends at night.  Denies abdominal pain, nausea, vomiting, diarrhea, rash, fever, chills, other symptoms.  Medical hx as outlined below.  ROS Per HPI      Objective:    BP (!) 142/90   Pulse 77   Temp 98 F (36.7 C) (Temporal)   Ht 5\' 1"  (1.549 m)   Wt 104 lb 8 oz (47.4 kg)   SpO2 96%   BMI 19.75 kg/m    Physical Exam Vitals and nursing note reviewed.  Constitutional:      General: She is not in acute distress.    Comments: Frail elderly  HENT:     Head: Normocephalic and atraumatic.  Eyes:     Extraocular Movements: Extraocular movements intact.  Cardiovascular:     Rate and Rhythm: Normal rate and regular rhythm.  Pulmonary:     Effort: Pulmonary effort is normal. No respiratory distress.     Breath sounds: No wheezing, rhonchi or rales.  Abdominal:     General: There is no distension.     Palpations: Abdomen is soft. There is no mass.     Tenderness: There is no abdominal tenderness. There is no right CVA tenderness or left CVA tenderness.  Musculoskeletal:     Cervical back: Normal range of motion.     Comments: Using rollator walker  Skin:    General: Skin is warm and dry.  Neurological:     General: No focal deficit present.     Mental Status: She is alert.  Psychiatric:        Mood and Affect: Mood normal.        Behavior: Behavior normal.     No results found for any visits on 02/02/24.      Assessment & Plan:   Dysuria -     POCT Urinalysis Dipstick (Automated) -     Urine Culture -     Cephalexin;  Take 1 capsule (500 mg total) by mouth 2 (two) times daily for 5 days.  Dispense: 10 capsule; Refill: 0  Urinary hesitancy -     POCT Urinalysis Dipstick (Automated) -     Urine Culture -     Cephalexin; Take 1 capsule (500 mg total) by mouth 2 (two) times daily for 5 days.  Dispense: 10 capsule; Refill: 0  Paper script sent with patient per request. She is a resident of [facility] in [place].  Meds ordered this encounter  Medications   cephALEXin (KEFLEX) 500 MG capsule    Sig: Take 1 capsule (500 mg total) by mouth 2 (two) times daily for 5 days.    Dispense:  10 capsule    Refill:  0    Return if symptoms worsen or fail to improve.  Wellington Half, FNP

## 2024-02-03 LAB — URINE CULTURE: Result:: NO GROWTH

## 2024-02-04 ENCOUNTER — Other Ambulatory Visit: Payer: Self-pay | Admitting: Internal Medicine

## 2024-02-04 NOTE — Telephone Encounter (Signed)
 Copied from CRM [id number]. Topic: Clinical - Medication Refill >> Feb 04, 2024  8:24 AM Freya Jesus wrote: Medication: traMADol  (ULTRAM ) 50 MG tablet [[id number]]  Has the patient contacted their pharmacy? No (Agent: If no, request that the patient contact the pharmacy for the refill. If patient does not wish to contact the pharmacy document the reason why and proceed with request.) (Agent: If yes, when and what did the pharmacy advise?)  This is the patient's preferred pharmacy:  [facility] [place], [place] - [address] [place] [address] Phone: [phone] Fax: [phone]  Is this the correct pharmacy for this prescription? Yes If no, delete pharmacy and type the correct one.   Has the prescription been filled recently? No  Is the patient out of the medication? Yes  Has the patient been seen for an appointment in the last year OR does the patient have an upcoming appointment? Yes  Can we respond through MyChart? No  Agent: Please be advised that Rx refills may take up to 3 business days. We ask that you follow-up with your pharmacy.

## 2024-02-05 ENCOUNTER — Ambulatory Visit: Payer: Self-pay | Admitting: Family Medicine

## 2024-02-05 MED ORDER — TRAMADOL HCL 50 MG PO TABS: 50.0000 mg | ORAL_TABLET | Freq: Four times a day (QID) | ORAL | 3 refills | Status: DC | PRN

## 2024-02-06 ENCOUNTER — Ambulatory Visit: Payer: Self-pay | Admitting: Family Medicine

## 2024-02-06 ENCOUNTER — Telehealth: Payer: Self-pay | Admitting: Internal Medicine

## 2024-02-06 ENCOUNTER — Encounter: Payer: Self-pay | Admitting: Family Medicine

## 2024-02-09 ENCOUNTER — Other Ambulatory Visit: Payer: Self-pay | Admitting: *Deleted

## 2024-02-09 DIAGNOSIS — R899 Unspecified abnormal finding in specimens from other organs, systems and tissues: Secondary | ICD-10-CM

## 2024-02-11 ENCOUNTER — Ambulatory Visit: Admitting: Endocrinology

## 2024-02-12 ENCOUNTER — Ambulatory Visit: Admitting: Internal Medicine

## 2024-02-13 DIAGNOSIS — M546 Pain in thoracic spine: Secondary | ICD-10-CM | POA: Diagnosis not present

## 2024-02-16 ENCOUNTER — Ambulatory Visit: Admitting: Internal Medicine

## 2024-02-25 ENCOUNTER — Encounter: Payer: Self-pay | Admitting: Internal Medicine

## 2024-02-25 ENCOUNTER — Telehealth: Payer: Self-pay | Admitting: Internal Medicine

## 2024-02-25 ENCOUNTER — Ambulatory Visit: Admitting: Internal Medicine

## 2024-02-25 VITALS — BP 118/68 | HR 93 | Temp 98.7°F | Ht 61.0 in | Wt 106.0 lb

## 2024-02-25 DIAGNOSIS — E1142 Type 2 diabetes mellitus with diabetic polyneuropathy: Secondary | ICD-10-CM

## 2024-02-25 DIAGNOSIS — M5440 Lumbago with sciatica, unspecified side: Secondary | ICD-10-CM | POA: Diagnosis not present

## 2024-02-25 DIAGNOSIS — F418 Other specified anxiety disorders: Secondary | ICD-10-CM

## 2024-02-25 DIAGNOSIS — G8929 Other chronic pain: Secondary | ICD-10-CM

## 2024-02-25 DIAGNOSIS — E118 Type 2 diabetes mellitus with unspecified complications: Secondary | ICD-10-CM

## 2024-02-25 MED ORDER — LORAZEPAM 1 MG PO TABS: 1.0000 mg | ORAL_TABLET | Freq: Two times a day (BID) | ORAL | 1 refills | Status: DC

## 2024-02-25 MED ORDER — TRAMADOL HCL 50 MG PO TABS: 50.0000 mg | ORAL_TABLET | Freq: Four times a day (QID) | ORAL | 5 refills | Status: DC

## 2024-02-25 NOTE — Assessment & Plan Note (Signed)
 Anxiety is worse - needs Lorazepam  to be given bid on schedule On [employer] , [place]

## 2024-02-25 NOTE — Assessment & Plan Note (Signed)
 SABRA

## 2024-02-25 NOTE — Assessment & Plan Note (Signed)
 F/u w/Endocrinology - Dr Mercie Using [place] 2 Use SS

## 2024-02-25 NOTE — Progress Notes (Signed)
 Subjective:  Patient ID: [name], female    DOB: 1941/05/03  Age: 83 y.o. MRN: [id number]  CC: Headache (Pt states she is needing her Ativan  to state twice a day one in the morning and one at night not PRN. Tramadol  instructions have to say every 6 hours and not PRN. Pt states she gets a lot of unsteadiness and headaches when she takes Replaginide and feeling unstable, pt states she has an increase with her anxiety w/o taking the Ativan  in the morning.)   HPI [name] presents for DM, anxiety - needs Lorazepam  to be given bid on schedule  Outpatient Medications Prior to Visit  Medication Sig Dispense Refill   Abaloparatide  (TYMLOS ) 3120 MCG/1.56ML SOPN Inject 80 mcg into the skin daily. 1.56 mL 12   aspirin  EC 81 MG tablet Take 1 tablet (81 mg total) by mouth daily.     BD PEN NEEDLE NANO U/F 32G X 4 MM MISC USE AS DIRECTED TWICE DAILY. 200 each 3   busPIRone  (BUSPAR ) 15 MG tablet Take 1 tablet by mouth 2 (two) times daily.     cetirizine (ZYRTEC) 10 MG tablet Take 10 mg by mouth daily.     cholecalciferol (VITAMIN D3) 25 MCG (1000 UNIT) tablet Take 1,000 Units by mouth daily.     Continuous Glucose Sensor (FREESTYLE LIBRE 3 PLUS SENSOR) MISC Change sensor every 15 days. 6 each 3   Continuous Glucose Sensor (FREESTYLE LIBRE 3 SENSOR) MISC 1 Device by Does not apply route every 14 (fourteen) days. Apply 1 sensor on upper arm every 14 days for continuous glucose monitoring 5 each 1   cyclobenzaprine  (FLEXERIL ) 5 MG tablet Take 5 mg by mouth 3 (three) times daily as needed.     docusate sodium  (COLACE) 100 MG capsule Take 1 capsule (100 mg total) by mouth 2 (two) times daily.     famotidine  (PEPCID ) 40 MG tablet Take 1 tablet (40 mg total) by mouth at bedtime. 90 tablet 3   insulin  lispro (HUMALOG  KWIKPEN) 100 UNIT/ML KwikPen Glucose < 100 : 0 units.   Glucose 100-150 : 3 units   Glucose > 150 : 5 units   Take Humalog  before supper as follows :   Glucose < 100 : 0 units.    Glucose > 100 : 2 units   Continue Prandin  1 mg with lunch. 15 mL 11   Insulin  Pen Needle (ASSURE ID DUO PRO PEN NEEDLES) 31G X 5 MM MISC 1 each by Does not apply route daily. 30 each 24   L-Methylfolate (DEPLIN) 15 MG TABS Take 15 mg by mouth daily. For treatment of depression     Magnesium Hydroxide (MILK OF MAGNESIA PO) Take 1-2 capsules by mouth daily as needed.     methocarbamol  (ROBAXIN ) 500 MG tablet Take 500-1,000 mg by mouth every 6 (six) hours as needed.     ONETOUCH VERIO test strip Use as instructed to check blood sugar once a day 100 each 3   pantoprazole  (PROTONIX ) 40 MG tablet TAKE 1 TABLET BY MOUTH TWICE A DAY 180 tablet 3   polyethylene glycol powder (GLYCOLAX/MIRALAX) 17 GM/SCOOP powder Take 1 Container by mouth as needed.     RA ACETAMINOPHEN  EX ST 500 MG tablet Take 500 mg by mouth every 6 (six) hours as needed.     repaglinide  (PRANDIN ) 1 MG tablet Take 1 tablet (1 mg total) by mouth daily at 12 noon. 90 tablet 4   Rimegepant Sulfate (NURTEC) 75 MG  TBDP Take 1 tablet (75 mg total) by mouth as needed. 4 tablet 0   TRINTELLIX  20 MG TABS Take 20 mg by mouth daily.  2   VOLTAREN 1 % GEL APPLY 2 GRAMS TO KNEE 2-3 TIMES DAILY PRN  1   zolmitriptan  (ZOMIG ) 5 MG nasal solution Place 1 spray into the nose as needed for migraine (in the nostril as directed). 6 each 5   LORazepam  (ATIVAN ) 1 MG tablet Take 1 tablet (1 mg total) by mouth every 8 (eight) hours as needed for anxiety. 270 tablet 1   traMADol  (ULTRAM ) 50 MG tablet Take 1-2 tablets (50-100 mg total) by mouth every 6 (six) hours as needed for severe pain (pain score 7-10). after surgery 180 tablet 3   No facility-administered medications prior to visit.    ROS: Review of Systems  Objective:  BP 118/68   Pulse 93   Temp 98.7 F (37.1 C) (Oral)   Ht 5' 1 (1.549 m)   Wt 106 lb (48.1 kg)   SpO2 92%   BMI 20.03 kg/m   BP Readings from Last 3 Encounters:  02/25/24 118/68  02/02/24 (!) 142/90  01/21/24 118/76     Wt Readings from Last 3 Encounters:  02/25/24 106 lb (48.1 kg)  02/02/24 104 lb 8 oz (47.4 kg)  01/21/24 105 lb (47.6 kg)    Physical Exam  Lab Results  Component Value Date   WBC 7.4 01/21/2024   HGB 11.9 (L) 01/21/2024   HCT 35.5 (L) 01/21/2024   PLT 427.0 (H) 01/21/2024   GLUCOSE 224 (H) 01/21/2024   CHOL 116 05/10/2020   TRIG 53.0 05/10/2020   HDL 71.30 05/10/2020   LDLDIRECT 48.0 06/18/2022   LDLCALC 34 05/10/2020   ALT 44 (H) 01/21/2024   AST 37 01/21/2024   NA 132 (L) 01/21/2024   K 4.1 01/21/2024   CL 96 01/21/2024   CREATININE 1.10 01/21/2024   BUN 24 (H) 01/21/2024   CO2 30 01/21/2024   TSH 1.01 01/21/2024   INR 1.0 05/12/2023   HGBA1C 6.5 11/17/2023   MICROALBUR 1.0 01/13/2024    DG HIP UNILAT WITH PELVIS 2-3 VIEWS RIGHT Result Date: 05/13/2023 CLINICAL DATA:  Femur fracture fixation EXAM: DG HIP (WITH OR WITHOUT PELVIS) 2-3V RIGHT COMPARISON:  Hip radiographs 1 day prior FINDINGS: Two C-arm fluoroscopic images were obtained intraoperatively and submitted for post operative interpretation. The patient is status post intramedullary nail fixation of the proximal femoral fracture. Hardware alignment is within expected limits, without evidence of immediate complication. Femoroacetabular alignment is normal. Please see the performing provider's procedural report for further detail. IMPRESSION: Intraoperative images during intramedullary nail fixation of the proximal right femoral fracture. Electronically Signed   By: Maude Harry M.D.   On: 05/13/2023 18:28   DG C-Arm 1-60 Min-No Report Result Date: 05/13/2023 Fluoroscopy was utilized by the requesting physician.  No radiographic interpretation.   Chest Portable 1 View Result Date: 05/12/2023 CLINICAL DATA:  Preop, hip fracture. EXAM: PORTABLE CHEST 1 VIEW COMPARISON:  Chest radiograph 05/16/2016 FINDINGS: The cardiomediastinal contours are normal. Minor subsegmental scarring in the right lower lobe. Pulmonary  vasculature is normal. No consolidation, pleural effusion, or pneumothorax. No acute osseous abnormalities are seen. IMPRESSION: No acute chest findings. Electronically Signed   By: Andrea Gasman M.D.   On: 05/12/2023 23:22   DG Hip Unilat W or Wo Pelvis 2-3 Views Right Result Date: 05/12/2023 CLINICAL DATA:  Right hip pain after fall. Deformity to the right hip  EXAM: DG HIP (WITH OR WITHOUT PELVIS) 2-3V RIGHT COMPARISON:  CT 06/28/2021 FINDINGS: Mildly displaced right intertrochanteric hip fracture. There is medial displacement of the lesser trochanter. No dislocation of the hips. Screw fixation right SI joint. IMPRESSION: Right intertrochanteric hip fracture with displaced lesser trochanter. Electronically Signed   By: Norman Gatlin M.D.   On: 05/12/2023 20:24    Assessment & Plan:   Problem List Items Addressed This Visit     DM type 2, controlled, with complication (HCC) - Primary   F/u w/Endocrinology - Dr Mercie Using [facility] 2 Use SS      Depression with anxiety   Anxiety is worse - needs Lorazepam  to be given bid on schedule On [employer] [facility] [place]      Relevant Medications   LORazepam  (ATIVAN ) 1 MG tablet   Low back pain    Continue w/Tramadol  50 mg qid  Potential benefits of a long term Tramadol /opioids use as well as potential risks (i.e. addiction risk, apnea etc) and complications (i.e. Somnolence, constipation and others) were explained to the patient and were aknowledged.      Relevant Medications   methocarbamol  (ROBAXIN ) 500 MG tablet   traMADol  (ULTRAM ) 50 MG tablet   Other Visit Diagnoses       Diabetic peripheral neuropathy associated with type 2 diabetes mellitus (HCC)       Relevant Medications   methocarbamol  (ROBAXIN ) 500 MG tablet   LORazepam  (ATIVAN ) 1 MG tablet         Meds ordered this encounter  Medications   LORazepam  (ATIVAN ) 1 MG tablet    Sig: Take 1 tablet (1 mg total) by mouth 2 (two) times daily.    Dispense:  180 tablet    Refill:   1   traMADol  (ULTRAM ) 50 MG tablet    Sig: Take 1 tablet (50 mg total) by mouth 4 (four) times daily. after surgery    Dispense:  120 tablet    Refill:  5    M54.50      Follow-up: Return in about 3 months (around 05/27/2024) for a follow-up visit.  Marolyn Noel, MD

## 2024-02-25 NOTE — Telephone Encounter (Signed)
 A representative from [facility] called to ask about patient's new prescriptions for tramadol  and lorazepam . She said they already have PRN orders in place for both medications, so she would like clarification on what needs to be done. She has provided the best callback and fax numbers.  Phone: [phone] Fax: [phone]

## 2024-02-25 NOTE — Assessment & Plan Note (Signed)
  Continue w/Tramadol  50 mg qid  Potential benefits of a long term Tramadol /opioids use as well as potential risks (i.e. addiction risk, apnea etc) and complications (i.e. Somnolence, constipation and others) were explained to the patient and were aknowledged.

## 2024-02-25 NOTE — Telephone Encounter (Signed)
 error

## 2024-02-26 NOTE — Telephone Encounter (Signed)
 Called [facility] and spoke to [place], about the clarification need for pt tramadol  and lorazepam . Per Montie, there is PRN for both medication but is seeing new prescription with different description about pt medication. I made her aware that, per pt current medication update list, there is no PRN on both medication. Lorazepam  is showing  Sig: Take 1 tablet (1 mg total) by mouth 2 (two) times daily And Tramadol  Sig: Take 1 tablet (50 mg total) by mouth 4 (four) times daily. after surgery. Montie asked if it on to D/C th PRN both, I told yes as the description on the new ones dose not state/show PRN

## 2024-02-28 DIAGNOSIS — Z7982 Long term (current) use of aspirin: Secondary | ICD-10-CM | POA: Diagnosis not present

## 2024-02-28 DIAGNOSIS — S22089D Unspecified fracture of T11-T12 vertebra, subsequent encounter for fracture with routine healing: Secondary | ICD-10-CM | POA: Diagnosis not present

## 2024-02-28 DIAGNOSIS — I129 Hypertensive chronic kidney disease with stage 1 through stage 4 chronic kidney disease, or unspecified chronic kidney disease: Secondary | ICD-10-CM | POA: Diagnosis not present

## 2024-03-01 ENCOUNTER — Ambulatory Visit: Admitting: Family Medicine

## 2024-03-02 ENCOUNTER — Telehealth: Payer: Self-pay | Admitting: Nutrition

## 2024-03-02 ENCOUNTER — Other Ambulatory Visit: Payer: Self-pay

## 2024-03-02 NOTE — Telephone Encounter (Signed)
 Patient has left several requests  on my machine that need MD/CMA assistance to complete.  I called her back and left her a message with directions on how to use the office phone message system to get to DR. Thapa's CMA

## 2024-03-03 ENCOUNTER — Ambulatory Visit (INDEPENDENT_AMBULATORY_CARE_PROVIDER_SITE_OTHER)

## 2024-03-03 ENCOUNTER — Ambulatory Visit: Payer: Self-pay | Admitting: Emergency Medicine

## 2024-03-03 ENCOUNTER — Telehealth: Payer: Self-pay

## 2024-03-03 ENCOUNTER — Ambulatory Visit: Payer: Self-pay

## 2024-03-03 ENCOUNTER — Encounter: Payer: Self-pay | Admitting: Emergency Medicine

## 2024-03-03 ENCOUNTER — Ambulatory Visit: Admitting: Emergency Medicine

## 2024-03-03 VITALS — BP 172/90 | HR 93 | Temp 98.1°F | Ht 61.0 in | Wt 105.0 lb

## 2024-03-03 DIAGNOSIS — S300XXA Contusion of lower back and pelvis, initial encounter: Secondary | ICD-10-CM | POA: Diagnosis not present

## 2024-03-03 DIAGNOSIS — W19XXXA Unspecified fall, initial encounter: Secondary | ICD-10-CM

## 2024-03-03 DIAGNOSIS — M5136 Other intervertebral disc degeneration, lumbar region with discogenic back pain only: Secondary | ICD-10-CM | POA: Diagnosis not present

## 2024-03-03 DIAGNOSIS — M47816 Spondylosis without myelopathy or radiculopathy, lumbar region: Secondary | ICD-10-CM | POA: Diagnosis not present

## 2024-03-03 DIAGNOSIS — M419 Scoliosis, unspecified: Secondary | ICD-10-CM | POA: Diagnosis not present

## 2024-03-03 DIAGNOSIS — Z043 Encounter for examination and observation following other accident: Secondary | ICD-10-CM | POA: Diagnosis not present
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
## 2024-03-03 NOTE — Assessment & Plan Note (Signed)
 No syncopal episode.  Clinically stable. Got up too fast and felt lightheaded and unsteady resulting in the fall No significant injuries.  No head injuries. Fall precautions given

## 2024-03-03 NOTE — Telephone Encounter (Signed)
 Patient called stating that staff at [facility] needed clarification of the sliding scale. This was completed in 12/2023. RN called and spoke to [place] who stated she was not sure who requested patient to do that as she received what was needed back in May. She also reviewed the current instructions with RN which was correct. Patient spouse requested copy of what staff had and it was sent via Mychart.

## 2024-03-03 NOTE — Telephone Encounter (Signed)
 Pt had an apptmnt at 11am with Dr. Purcell.

## 2024-03-03 NOTE — Telephone Encounter (Signed)
 FYI-Patient refused nurse triage.  Has an appt at 58.    Copied from CRM [id number]. Topic: Clinical - Red Word Triage >> Mar 03, 2024 10:00 AM Suzen RAMAN wrote: Red Word that prompted transfer to Nurse Triage: Fall, Severe Pain Reason for Disposition . Caller hangs up  Protocols used: Difficult Call-A-AH

## 2024-03-03 NOTE — Assessment & Plan Note (Signed)
 Secondary to recent fall.  Hematoma on physical examination which is tender to palpation.  Pain management discussed Okay to continue tramadol  and muscle relaxant as needed Recommend x-ray today.  Will review images when ready Recommend follow-up with PCP next week as needed

## 2024-03-03 NOTE — Patient Instructions (Signed)
Contusion A contusion is a deep bruise. This is a result of an injury that causes bleeding under the skin. Symptoms of bruising include pain, swelling, and discolored skin. The skin may turn blue, purple, or yellow. Follow these instructions at home: Managing pain, stiffness, and swelling You may use RICE. This stands for: Resting. Icing. Compression, or putting pressure on the injured area. Elevating, or raising the injured area. To follow this method, do these actions: Rest the injured area. If told, put ice on the injured area. To do this: Put ice in a plastic bag. Place a towel between your skin and the bag. Leave the ice on for 20 minutes, 2-3 times per day. If your skin turns bright red, take off the ice right away to prevent skin damage. The risk of skin damage is higher if you cannot feel pain, heat, or cold. If told, apply compression on the injured area using an elastic bandage. Make sure the bandage is not too tight. If the area tingles or has a loss of feeling (numbness), remove it and put it back on as told by your doctor. If possible, elevate the injured area above the level of your heart while you are sitting or lying down.  General instructions Take over-the-counter and prescription medicines only as told by your doctor. Keep all follow-up visits. Your doctor may want to see how your contusion is healing with treatment. Contact a doctor if: Your symptoms do not get better after several days of treatment. Your symptoms get worse. You have trouble moving the injured area. Get help right away if: You have very bad pain. You have a loss of feeling (numbness) in a hand or foot. Your hand or foot turns pale or cold. This information is not intended to replace advice given to you by your health care provider. Make sure you discuss any questions you have with your health care provider. Document Revised: 01/28/2022 Document Reviewed: 01/28/2022 Elsevier Patient Education  2024  [employer].

## 2024-03-03 NOTE — Telephone Encounter (Signed)
 Patient called office stating that [facility] needed clarification regarding her sliding scale. This was done back in 12/2023. Called staff to speak to [facility] who stated she was not sure why the patient called and stated that because they received what was needed in May. Reviewed the scale again while on call with R[place] which is correct.Spouse requested via VM that we give him whatever info that we gave staff. Sent scale to patient via mychart for their own records.

## 2024-03-04 ENCOUNTER — Ambulatory Visit (INDEPENDENT_AMBULATORY_CARE_PROVIDER_SITE_OTHER): Admitting: *Deleted

## 2024-03-04 DIAGNOSIS — M810 Age-related osteoporosis without current pathological fracture: Secondary | ICD-10-CM | POA: Diagnosis not present

## 2024-03-04 MED ORDER — ROMOSOZUMAB-AQQG 105 MG/1.17ML ~~LOC~~ SOSY
210.0000 mg | PREFILLED_SYRINGE | Freq: Once | SUBCUTANEOUS | Status: AC
  Administered 2024-03-04: 210 mg via SUBCUTANEOUS

## 2024-03-04 NOTE — Progress Notes (Signed)
 Patient is here for evenity  injection #2. Patient received bilateral arm [place] evenity  injections today. She tolerated injections well. She will return next month for her next Evenity .

## 2024-03-05 ENCOUNTER — Other Ambulatory Visit: Payer: Self-pay

## 2024-03-05 ENCOUNTER — Telehealth: Payer: Self-pay

## 2024-03-05 DIAGNOSIS — E119 Type 2 diabetes mellitus without complications: Secondary | ICD-10-CM

## 2024-03-05 MED ORDER — DEXCOM G7 SENSOR MISC: 3 refills | Status: AC

## 2024-03-05 MED ORDER — DEXCOM G7 RECEIVER DEVI
1.0000 | 0 refills | Status: AC
Start: 2024-03-05 — End: ?

## 2024-03-05 NOTE — Telephone Encounter (Signed)
 Keke from [facility] called requesting patient have a Dexcom reader and sensors ordered in place of her freestyle\. Per staff she has been having lows in the middle of the night as well as some sensors that were ineffective and she feels this would be a better option. Called KeKe back and sent RX to [facility] which is pharmacy [facility] uses for their patients per KeKe.

## 2024-03-12 ENCOUNTER — Ambulatory Visit: Payer: Self-pay

## 2024-03-12 NOTE — Telephone Encounter (Signed)
 FYI Only or Action Required?: Action required by provider: patient needs follow up call in regards to appointment and provider recommendation.  Patient was last seen in primary care on 03/03/2024 by Purcell Emil Schanz, MD.  Called Nurse Triage reporting Back Pain.  Symptoms began a week ago.  Interventions attempted: OTC medications: Acetaminophen , Prescription medications: Tramadol , Ativan , and Rest, hydration, or home remedies.  Symptoms are: unchanged.  Triage Disposition: See PCP When Office is Open (Within 3 Days)-needs follow up call in regards to possibly needing a follow up appointment with PCP  Patient/caregiver understands and will follow disposition?: No, wishes to speak with PCP  Copied from CRM [id number]. Topic: Clinical - Red Word Triage >> Mar 12, 2024 10:36 AM Berneda FALCON wrote: Red Word that prompted transfer to Nurse Triage: Patient had a fall last Monday 7/14 and they took Xrays at the office. Did not have any fractures but still has a lot of pain and wants to know what she should do.  This pain is in her back area and in the side and into the front of her chest (under breast). Reason for Disposition  [1] MODERATE back pain (e.g., interferes with normal activities) AND [2] present > 3 days  Answer Assessment - Initial Assessment Questions 1. ONSET: When did the pain begin? (e.g., minutes, hours, days)     Patient started after fall on 03/03/2024 2. LOCATION: Where does it hurt? (upper, mid or lower back)     Upper back pain 3. SEVERITY: How bad is the pain?  (e.g., Scale 1-10; mild, moderate, or severe)     Moderate to severe 4. PATTERN: Is the pain constant? (e.g., yes, no; constant, intermittent)      constant 5. RADIATION: Does the pain shoot into your legs or somewhere else?     No radiation 6. CAUSE:  What do you think is causing the back pain?      From a fall on 03/03/2024 7. BACK OVERUSE:  Any recent lifting of heavy objects, strenuous work or  exercise?     no 8. MEDICINES: What have you taken so far for the pain? (e.g., nothing, acetaminophen , NSAIDS)     Acetaminophen , Tramadol , Ativan  9. NEUROLOGIC SYMPTOMS: Do you have any weakness, numbness, or problems with bowel/bladder control?     no 10. OTHER SYMPTOMS: Do you have any other symptoms? (e.g., fever, abdomen pain, burning with urination, blood in urine)       No  Patient calling with concerns of continued back pain from her fall. Patient was diagnosed with a contusion. Patient reports having taken tramadol , acetaminophen  and ativan  today-medications have helped but pain does return. Patient does endorse that she hasn't taken any muscle relaxer today. Patient is encouraged to try the muscle relaxer and to try to stay on top of her pain. Patient instructed to follow up in the ED if pain doesn't respond to medications.  Protocols used: Back Pain-A-AH

## 2024-03-15 NOTE — Telephone Encounter (Signed)
 Please schedule office visit with me or any other available provider.  Thank you

## 2024-03-16 NOTE — Telephone Encounter (Signed)
 Tried to reach the pt to inform her of PCP advise as follows   Please schedule office visit with me or any other available provider.  Thank you

## 2024-03-17 ENCOUNTER — Other Ambulatory Visit: Payer: Self-pay

## 2024-03-17 ENCOUNTER — Encounter (HOSPITAL_BASED_OUTPATIENT_CLINIC_OR_DEPARTMENT_OTHER): Payer: Self-pay

## 2024-03-17 ENCOUNTER — Emergency Department (HOSPITAL_BASED_OUTPATIENT_CLINIC_OR_DEPARTMENT_OTHER)
Admission: EM | Admit: 2024-03-17 | Discharge: 2024-03-17 | Disposition: A | Discharge status: Home / Self Care | Attending: Emergency Medicine | Admitting: Emergency Medicine

## 2024-03-17 ENCOUNTER — Emergency Department (HOSPITAL_BASED_OUTPATIENT_CLINIC_OR_DEPARTMENT_OTHER)

## 2024-03-17 ENCOUNTER — Ambulatory Visit: Payer: Self-pay

## 2024-03-17 DIAGNOSIS — I517 Cardiomegaly: Secondary | ICD-10-CM | POA: Diagnosis not present

## 2024-03-17 DIAGNOSIS — R0789 Other chest pain: Secondary | ICD-10-CM | POA: Diagnosis not present

## 2024-03-17 DIAGNOSIS — E119 Type 2 diabetes mellitus without complications: Secondary | ICD-10-CM | POA: Diagnosis not present

## 2024-03-17 DIAGNOSIS — Z85038 Personal history of other malignant neoplasm of large intestine: Secondary | ICD-10-CM | POA: Diagnosis not present

## 2024-03-17 DIAGNOSIS — W1839XA Other fall on same level, initial encounter: Secondary | ICD-10-CM | POA: Insufficient documentation

## 2024-03-17 DIAGNOSIS — R42 Dizziness and giddiness: Secondary | ICD-10-CM | POA: Diagnosis not present

## 2024-03-17 DIAGNOSIS — R079 Chest pain, unspecified: Secondary | ICD-10-CM | POA: Diagnosis not present

## 2024-03-17 MED ORDER — LIDOCAINE 5 % EX PTCH
1.0000 | MEDICATED_PATCH | CUTANEOUS | Status: DC
  Administered 2024-03-17: 1 via TRANSDERMAL
  Filled 2024-03-17: qty 1

## 2024-03-17 NOTE — ED Notes (Signed)
 Pt. Demonstrates use of IS to Resp. Therapist and RN
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
## 2024-03-17 NOTE — ED Triage Notes (Signed)
 Pt presents to ED with complaint of a fall 2 weeks ago. States that she has right sided back  and flank pain. Pt reports no difficulty breathing. States that she resided in assisted living at [facility]. Took Tramadol  at home prior to arrival

## 2024-03-17 NOTE — Telephone Encounter (Signed)
 FYI Only or Action Required?: FYI only for provider.  Patient was last seen in primary care on 03/03/2024 by Purcell Emil Schanz, MD.  Called Nurse Triage reporting Chest Pain.  Symptoms began 03/03/2024 s/p fall into nightstand.  Interventions attempted: OTC medications: acetaminophen , Prescription medications: tramadol , muscle relaxer, Rest, hydration, or home remedies, and Ice/heat application.  Symptoms are: gradually worsening.  Triage Disposition: Go to ED Now (overriding See Physician Within 24 Hours)  Patient/caregiver understands and will follow disposition?:   Copied from CRM #[id number]. Topic: Clinical - Red Word Triage >> Mar 17, 2024  4:22 PM Zebedee SAUNDERS wrote: Red Word that prompted transfer to Nurse Triage: Pt fell a couple of weeks ago but has pain under right breast. Reason for Disposition  [1] MODERATE pain (e.g., interferes with normal activities) AND [2] high-risk adult (e.g., age > 60 years, osteoporosis, chronic steroid use)  Answer Assessment - Initial Assessment Questions 1. MECHANISM: How did the injury happen?     Patient fell getting up to the bathroom on 7/9-she fell into the nightstand.  2. ONSET: When did the injury happen? (.e.g., minutes, hours, days ago)     7/9 3. LOCATION: Where on the chest is the injury located?     Right lower rib area which goes into her back 4. APPEARANCE: What does the injury look like?     Bruising to the back when she initially fell 5. BLEEDING: Is there any bleeding now? If Yes, ask: How long has it been bleeding?     no 6. SEVERITY: Any difficulty with breathing?     No shortness of breath 7. SIZE: For cuts, bruises, or swelling, ask: How large is it? (e.g., inches or centimeters)     no 8. PAIN: Is there pain? If Yes, ask: How bad is the pain? (e.g., Scale 0-10; none, mild, moderate, severe)     8 out of 10  Patient recommended to be seen in ED tonight to be reevaluated due to continued symptoms.  Patient denies shortness of breath but does sound winded speaking with Nurse Triage.  Protocols used: Chest Injury-A-AH

## 2024-03-17 NOTE — Discharge Instructions (Signed)
 Your chest x-ray did not show any obvious rib fracture but based on your exam I suspect that you may have a small crack in one of your ribs on the right side.  Unfortunately this type of injury hurts but there is no way to fix it immediately; it must heal over time.  Be sure to take plenty of deep breaths so that you get rid of the bad air in your lungs.  If you are given a device called an incentive spirometer, please use it as recommended.  Unless you have been told by your doctor not to, you can also take Tylenol  1000 mg every 6 hours for pain.  Follow-up at the clinics or with the doctors described in this paperwork.  Return to the emergency department if he develop new or worsening symptoms that concern you.

## 2024-03-22 ENCOUNTER — Other Ambulatory Visit: Payer: Self-pay

## 2024-03-22 ENCOUNTER — Telehealth: Payer: Self-pay | Admitting: Internal Medicine

## 2024-03-22 DIAGNOSIS — E119 Type 2 diabetes mellitus without complications: Secondary | ICD-10-CM

## 2024-03-22 MED ORDER — FREESTYLE LIBRE 3 PLUS SENSOR MISC: 3 refills | Status: DC

## 2024-03-22 NOTE — Telephone Encounter (Signed)
 Sorry, we cannot send this on our system to a facility  We can only send to pharmacy, so if they know of the pharmacy that works with the facility, please let us  know   thanks

## 2024-03-22 NOTE — Telephone Encounter (Signed)
 Caregiver called requesting refill on patient's freestyle libre 3+, stating that the Dexcom was not covered by insurance.

## 2024-03-22 NOTE — Telephone Encounter (Unsigned)
 Copied from CRM #[id number]. Topic: Clinical - Medication Question >> Mar 22, 2024  9:33 AM [name] wrote: Reason for CRM: Patient called in regarding patient prescription  traMADol  (ULTRAM ) 50 MG tablet needs to be sent to [facility] as that is where patient is , the fax number is [phone]

## 2024-03-23 ENCOUNTER — Ambulatory Visit: Payer: Self-pay

## 2024-03-23 MED ORDER — TRAMADOL HCL 50 MG PO TABS: 50.0000 mg | ORAL_TABLET | Freq: Four times a day (QID) | ORAL | 1 refills | Status: DC

## 2024-03-23 NOTE — Telephone Encounter (Signed)
 Copied from CRM #[id number]. Topic: Clinical - Medical Advice >> Mar 23, 2024  9:51 AM Precious C wrote: Reason for CRM: Patient called in due to constipation. She stated she has been taking two over-the-counter medications-Milk of Magnesia and MiraLAX-but neither have been effective. She is requesting a recommendation on what to do moving forward and would like a response via MyChart or by phone at [phone].

## 2024-03-23 NOTE — Telephone Encounter (Signed)
 In regards to request for tramadol , patient states medication can be sent to [facility] in [place].

## 2024-03-23 NOTE — Telephone Encounter (Signed)
 FYI Only or Action Required?: Action required by provider: clinical question for provider.  Patient was last seen in primary care on 03/03/2024 by Purcell Emil Schanz, MD.  Called Nurse Triage reporting Constipation.  Symptoms began several days ago.  Interventions attempted: Milk of Magnesia and MiraLAX.  Symptoms are: unchanged.  Triage Disposition: Call PCP When Office is Open (overriding See PCP Within 2 Weeks)  Patient/caregiver understands and will follow disposition?: Yes                              Reason for Disposition  Constipation is a chronic symptom (recurrent or ongoing AND present > 4 weeks)  Answer Assessment - Initial Assessment Questions 1. STOOL PATTERN OR FREQUENCY: How often do you have a bowel movement (BM)?  (Normal range: 3 times a day to every 3 days)  When was your last BM?       States she had a hard and small BM today, it had been 3 days since last BM 2. STRAINING: Do you have to strain to have a BM?      Yes  3. ONSET: When did the constipation begin?     Ongoing, states this has been a bad episode 4. RECTAL PAIN: Does your rectum hurt when the stool comes out? If Yes, ask: Do you have hemorrhoids? How bad is the pain?  (Scale 1-10; or mild, moderate, severe)     Denies 5. BM COMPOSITION: Are the stools hard?      Yes 6. BLOOD ON STOOLS: Has there been any blood on the toilet tissue or on the surface of the BM? If Yes, ask: When was the last time?     Denies 7. CHRONIC CONSTIPATION: Is this a new problem for you?  If No, ask: How long have you had this problem? (days, weeks, months)      Yes 8. CHANGES IN DIET OR HYDRATION: Have there been any recent changes in your diet? How much fluids are you drinking on a daily basis?  How much have you had to drink today?     Denies, states she eats normally and drinks a lot of water  9. MEDICINES: Have you been taking any new medicines? Are you  taking any narcotic pain medicines? (e.g., Dilaudid , morphine, Percocet, Vicodin)     Denies new medications 10. LAXATIVES: Have you been using any stool softeners, laxatives, or enemas?  If Yes, ask What are you using, how often, and when was the last time?     Milk of Magnesia and MiraLAX 14. OTHER SYMPTOMS: Do you have any other symptoms? (e.g., abdomen pain, bloating, fever, vomiting)     Stomach feels tight, back pain at baseline is worsening, pain related to hairline fracture of ribs, denies difficulty breathing, denies bloating    Patient states constipation is a chronic issue, but this episode has been really bad. Patient has been taking Milk of Magnesia and MiraLAX, but states these medications do not seem to be helping. Patient is seeking provider's input on if there is anything else she can take to help.    In regards to request for tramadol , patient states medication can be sent to [facility] in [place].  Protocols used: Constipation-A-AH

## 2024-03-23 NOTE — Telephone Encounter (Signed)
Ok this is done thanks

## 2024-03-24 ENCOUNTER — Other Ambulatory Visit: Payer: Self-pay | Admitting: Internal Medicine

## 2024-03-24 ENCOUNTER — Other Ambulatory Visit: Payer: Self-pay

## 2024-03-24 MED ORDER — LINACLOTIDE 290 MCG PO CAPS: 290.0000 ug | ORAL_CAPSULE | Freq: Every day | ORAL | 5 refills | Status: AC

## 2024-03-24 MED ORDER — LINACLOTIDE 290 MCG PO CAPS
290.0000 ug | ORAL_CAPSULE | Freq: Every day | ORAL | 5 refills | Status: DC
Start: 2024-03-24 — End: 2024-03-24

## 2024-03-24 NOTE — Telephone Encounter (Signed)
 I meant Dulcolax suppositories in the previous message.  Thank you

## 2024-03-24 NOTE — Progress Notes (Signed)
 Rx

## 2024-03-24 NOTE — Telephone Encounter (Signed)
 Try to use Dulcolax's in addition to other efforts.  I will prescribe Linzess .  Take daily

## 2024-03-30 ENCOUNTER — Other Ambulatory Visit: Payer: Self-pay

## 2024-03-30 ENCOUNTER — Telehealth: Payer: Self-pay

## 2024-03-30 NOTE — Telephone Encounter (Signed)
 Spouse called to follow up on sensors. Stated he spoke to pharmacy and they have them, so currently there is no further concerns

## 2024-04-06 ENCOUNTER — Telehealth: Payer: Self-pay

## 2024-04-06 NOTE — Telephone Encounter (Signed)
 Copied from CRM [id number]. Topic: Clinical - Medication Question >> Apr 06, 2024 10:21 AM [name] wrote: Reason for CRM: Patient would like to get her  pantoprazole  (PROTONIX ) 40 MG tablet changed because its been causing heartburn

## 2024-04-07 MED ORDER — RABEPRAZOLE SODIUM 20 MG PO TBEC: 20.0000 mg | DELAYED_RELEASE_TABLET | Freq: Every day | ORAL | 3 refills | Status: AC

## 2024-04-07 NOTE — Telephone Encounter (Signed)
 Noted.  This is unusual.  Will switch to Rabeprazole  20 mg daily

## 2024-04-07 NOTE — Telephone Encounter (Signed)
 Provider has switched pts medication to Noted.  This is unusual.  Will switch to Rabeprazole  20 mg daily

## 2024-04-12 ENCOUNTER — Ambulatory Visit (INDEPENDENT_AMBULATORY_CARE_PROVIDER_SITE_OTHER): Admitting: Endocrinology

## 2024-04-12 ENCOUNTER — Encounter: Payer: Self-pay | Admitting: Endocrinology

## 2024-04-12 ENCOUNTER — Ambulatory Visit: Payer: Self-pay | Admitting: Endocrinology

## 2024-04-12 ENCOUNTER — Ambulatory Visit (INDEPENDENT_AMBULATORY_CARE_PROVIDER_SITE_OTHER)

## 2024-04-12 VITALS — BP 138/82 | HR 86 | Resp 20 | Ht 61.0 in | Wt 108.8 lb

## 2024-04-12 DIAGNOSIS — M810 Age-related osteoporosis without current pathological fracture: Secondary | ICD-10-CM | POA: Diagnosis not present

## 2024-04-12 DIAGNOSIS — E119 Type 2 diabetes mellitus without complications: Secondary | ICD-10-CM

## 2024-04-12 DIAGNOSIS — Z794 Long term (current) use of insulin: Secondary | ICD-10-CM | POA: Diagnosis not present

## 2024-04-12 DIAGNOSIS — E118 Type 2 diabetes mellitus with unspecified complications: Secondary | ICD-10-CM

## 2024-04-12 LAB — POCT GLYCOSYLATED HEMOGLOBIN (HGB A1C): Hemoglobin A1C: 6.6 % — AB (ref 4.0–5.6)

## 2024-04-12 MED ORDER — INSULIN LISPRO (1 UNIT DIAL) 100 UNIT/ML (KWIKPEN): PEN_INJECTOR | SUBCUTANEOUS | 11 refills | Status: AC

## 2024-04-12 MED ORDER — ROMOSOZUMAB-AQQG 105 MG/1.17ML ~~LOC~~ SOSY
210.0000 mg | PREFILLED_SYRINGE | Freq: Once | SUBCUTANEOUS | Status: AC
  Administered 2024-04-12: 210 mg via SUBCUTANEOUS

## 2024-04-12 MED ORDER — INSULIN LISPRO (1 UNIT DIAL) 100 UNIT/ML (KWIKPEN): PEN_INJECTOR | SUBCUTANEOUS | 11 refills | Status: DC

## 2024-04-12 MED ORDER — INSULIN LISPRO (1 UNIT DIAL) 100 UNIT/ML (KWIKPEN)
PEN_INJECTOR | SUBCUTANEOUS | 11 refills | Status: DC
Start: 2024-04-12 — End: 2024-04-12
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
## 2024-04-12 NOTE — Progress Notes (Signed)
 Patient is here for evenity  injection #3. Patient received bilateral arm [place] evenity  injections today. She tolerated injections well. She will return next month for her next Evenity .

## 2024-04-12 NOTE — Patient Instructions (Addendum)
 Diabetes regimen   Humalog  : Take 1 unit with breakfast, 3 units with lunch and 2 units with supper, take insulin  before eating and need to eat within 15 minutes of insulin  injection.  Humalog  take 1 unit with breakfast, 3 un  Stop repaglinide / prandin .

## 2024-04-13 ENCOUNTER — Encounter: Payer: Self-pay | Admitting: Endocrinology

## 2024-04-14 ENCOUNTER — Telehealth: Payer: Self-pay

## 2024-04-14 NOTE — Telephone Encounter (Signed)
 Copied from CRM [id number]. Topic: Clinical - Request for Lab/Test Order >> Apr 14, 2024  3:21 PM Drema MATSU wrote: Reason for CRM: Montie with [facility] is needing an order to have her blood sugar checked. She wants to check it before she gets insulin . Fax: [phone]

## 2024-04-15 ENCOUNTER — Telehealth: Payer: Self-pay

## 2024-04-15 NOTE — Telephone Encounter (Signed)
 Copied from CRM #[id number]. Topic: Clinical - Medication Question >> Apr 15, 2024  3:32 PM Berneda FALCON wrote: Reason for CRM: has new orders for humalog  given 15 min before meals. States that previously needed to be able to admin herself at home, but it can be hard to give 15 minutes before meals. Is there a timeframe in which she can get it before or after she eats. Patient doesnt eat often and is not always in the dining room.  Montie from [phone] Fax-[phone]

## 2024-04-16 ENCOUNTER — Other Ambulatory Visit: Payer: Self-pay

## 2024-04-16 DIAGNOSIS — E119 Type 2 diabetes mellitus without complications: Secondary | ICD-10-CM

## 2024-04-19 NOTE — Telephone Encounter (Signed)
 Okay. Thank you.

## 2024-04-19 NOTE — Telephone Encounter (Signed)
 She can do it right before the meal, while she is eating or right after the meal.  Check sugar before meals.  Thanks

## 2024-04-19 NOTE — Telephone Encounter (Signed)
 Attempted to reach Montie in regards to providers instructions as follows She can do it right before the meal, while she is eating or right after the meal. Check sugar before meals. Thanks

## 2024-04-21 ENCOUNTER — Ambulatory Visit: Admitting: Endocrinology

## 2024-04-23 ENCOUNTER — Ambulatory Visit: Admitting: Endocrinology

## 2024-04-26 DIAGNOSIS — M545 Low back pain, unspecified: Secondary | ICD-10-CM | POA: Diagnosis not present

## 2024-04-26 DIAGNOSIS — M546 Pain in thoracic spine: Secondary | ICD-10-CM | POA: Diagnosis not present

## 2024-04-26 DIAGNOSIS — S22080A Wedge compression fracture of T11-T12 vertebra, initial encounter for closed fracture: Secondary | ICD-10-CM | POA: Diagnosis not present

## 2024-04-26 DIAGNOSIS — R519 Headache, unspecified: Secondary | ICD-10-CM | POA: Diagnosis not present

## 2024-04-26 DIAGNOSIS — M549 Dorsalgia, unspecified: Secondary | ICD-10-CM | POA: Diagnosis not present

## 2024-04-26 DIAGNOSIS — W19XXXA Unspecified fall, initial encounter: Secondary | ICD-10-CM | POA: Diagnosis not present

## 2024-04-26 DIAGNOSIS — G44309 Post-traumatic headache, unspecified, not intractable: Secondary | ICD-10-CM | POA: Diagnosis not present

## 2024-04-26 DIAGNOSIS — M542 Cervicalgia: Secondary | ICD-10-CM | POA: Diagnosis not present

## 2024-04-26 DIAGNOSIS — M25551 Pain in right hip: Secondary | ICD-10-CM | POA: Diagnosis not present

## 2024-04-26 DIAGNOSIS — R079 Chest pain, unspecified: Secondary | ICD-10-CM | POA: Diagnosis not present

## 2024-04-28 ENCOUNTER — Ambulatory Visit: Admitting: Internal Medicine

## 2024-04-28 ENCOUNTER — Telehealth: Payer: Self-pay | Admitting: Internal Medicine

## 2024-04-28 NOTE — Telephone Encounter (Unsigned)
 Copied from CRM #[id number]. Topic: General - Running Late >> Apr 28, 2024  2:48 PM Gennette ORN wrote: Patient/patient representative is calling because they are running 10 minutes late for an appointment.

## 2024-05-03 DIAGNOSIS — M546 Pain in thoracic spine: Secondary | ICD-10-CM | POA: Diagnosis not present

## 2024-05-04 ENCOUNTER — Inpatient Hospital Stay: Admitting: Internal Medicine

## 2024-05-12 ENCOUNTER — Ambulatory Visit: Admitting: Internal Medicine

## 2024-05-12 ENCOUNTER — Encounter: Payer: Self-pay | Admitting: Internal Medicine

## 2024-05-12 VITALS — BP 124/80 | HR 80 | Temp 98.0°F | Ht 63.5 in | Wt 107.0 lb

## 2024-05-12 DIAGNOSIS — M170 Bilateral primary osteoarthritis of knee: Secondary | ICD-10-CM | POA: Diagnosis not present

## 2024-05-12 DIAGNOSIS — F418 Other specified anxiety disorders: Secondary | ICD-10-CM | POA: Diagnosis not present

## 2024-05-12 DIAGNOSIS — E118 Type 2 diabetes mellitus with unspecified complications: Secondary | ICD-10-CM | POA: Diagnosis not present

## 2024-05-12 DIAGNOSIS — M5416 Radiculopathy, lumbar region: Secondary | ICD-10-CM

## 2024-05-12 DIAGNOSIS — J40 Bronchitis, not specified as acute or chronic: Secondary | ICD-10-CM

## 2024-05-12 DIAGNOSIS — R413 Other amnesia: Secondary | ICD-10-CM | POA: Diagnosis not present

## 2024-05-12 MED ORDER — AZITHROMYCIN 250 MG PO TABS: ORAL_TABLET | ORAL | 0 refills | Status: DC

## 2024-05-12 NOTE — Assessment & Plan Note (Signed)
 F/u w/Endocrinology - Dr Mercie Using [place] 2 Use SS

## 2024-05-12 NOTE — Patient Instructions (Signed)

## 2024-05-12 NOTE — Assessment & Plan Note (Signed)
 Stable Heating pad

## 2024-05-12 NOTE — Assessment & Plan Note (Signed)
 Acute - Z pack given

## 2024-05-12 NOTE — Assessment & Plan Note (Signed)
 Anxiety is worse - needs Lorazepam  to be given bid on schedule On [employer] , [place]

## 2024-05-12 NOTE — Progress Notes (Signed)
 Subjective:  Patient ID: [name], female    DOB: Dec 06, 1940  Age: 83 y.o. MRN: [id number]  CC: Hospitalization Follow-up (Went to hospital after she fell. They did x-ray and did not find anything new. States she feels like she is getting a cold, doesn't know if it is allergy. States tight chest and coughing, and that she does not feel well. )   HPI MIRAY MANCINO presents for Hospitalization Follow-up  - Went to hospital after she fell on 04/26/24. They did x-ray and did not find anything new. States she feels like she is getting a cold, doesn't know if it is allergy. States tight chest and coughing, and that she does not feel well, brown sputum.  C/o ST x 1 week  F/u on consipation - Linzess  is not working too well  F/u on LBP - seeing Dr Cala   Outpatient Medications Prior to Visit  Medication Sig Dispense Refill   Abaloparatide  (TYMLOS ) 3120 MCG/1.56ML SOPN Inject 80 mcg into the skin daily. 1.56 mL 12   aspirin  EC 81 MG tablet Take 1 tablet (81 mg total) by mouth daily.     BD PEN NEEDLE NANO U/F 32G X 4 MM MISC USE AS DIRECTED TWICE DAILY. 200 each 3   busPIRone  (BUSPAR ) 15 MG tablet Take 1 tablet by mouth 2 (two) times daily.     cetirizine (ZYRTEC) 10 MG tablet Take 10 mg by mouth daily.     cholecalciferol (VITAMIN D3) 25 MCG (1000 UNIT) tablet Take 1,000 Units by mouth daily.     Continuous Glucose Sensor (FREESTYLE LIBRE 3 PLUS SENSOR) MISC Change sensor every 15 days. 6 each 3   Continuous Glucose Sensor (FREESTYLE LIBRE 3 SENSOR) MISC 1 Device by Does not apply route every 14 (fourteen) days. Apply 1 sensor on upper arm every 14 days for continuous glucose monitoring 5 each 1   cyclobenzaprine  (FLEXERIL ) 5 MG tablet Take 5 mg by mouth 3 (three) times daily as needed.     docusate sodium  (COLACE) 100 MG capsule Take 1 capsule (100 mg total) by mouth 2 (two) times daily.     famotidine  (PEPCID ) 40 MG tablet Take 1 tablet (40 mg total) by mouth at bedtime. 90 tablet 3    insulin  lispro (HUMALOG  KWIKPEN) 100 UNIT/ML KwikPen Take 1 unit with breakfast, 3 units with lunch and 2 units with supper, take insulin  before eating and need to eat within 15 minutes of insulin  injection. 15 mL 11   Insulin  Pen Needle (ASSURE ID DUO PRO PEN NEEDLES) 31G X 5 MM MISC 1 each by Does not apply route daily. 30 each 24   L-Methylfolate (DEPLIN) 15 MG TABS Take 15 mg by mouth daily. For treatment of depression     linaclotide  (LINZESS ) 290 MCG CAPS capsule Take 1 capsule (290 mcg total) by mouth daily. 30 capsule 5   LORazepam  (ATIVAN ) 1 MG tablet Take 1 tablet (1 mg total) by mouth 2 (two) times daily. 180 tablet 1   Magnesium Hydroxide (MILK OF MAGNESIA PO) Take 1-2 capsules by mouth daily as needed.     ONETOUCH VERIO test strip Use as instructed to check blood sugar once a day 100 each 3   polyethylene glycol powder (GLYCOLAX/MIRALAX) 17 GM/SCOOP powder Take 1 Container by mouth as needed.     RA ACETAMINOPHEN  EX ST 500 MG tablet Take 500 mg by mouth every 6 (six) hours as needed.     RABEprazole  (ACIPHEX ) 20 MG tablet Take  1 tablet (20 mg total) by mouth daily. 90 tablet 3   Rimegepant Sulfate (NURTEC) 75 MG TBDP Take 1 tablet (75 mg total) by mouth as needed. 4 tablet 0   traMADol  (ULTRAM ) 50 MG tablet Take 1 tablet (50 mg total) by mouth 4 (four) times daily. after surgery 120 tablet 1   TRINTELLIX  20 MG TABS Take 20 mg by mouth daily.  2   VOLTAREN 1 % GEL APPLY 2 GRAMS TO KNEE 2-3 TIMES DAILY PRN  1   zolmitriptan  (ZOMIG ) 5 MG nasal solution Place 1 spray into the nose as needed for migraine (in the nostril as directed). 6 each 5   Continuous Glucose Receiver (DEXCOM G7 RECEIVER) DEVI 1 Device by Does not apply route continuous. (Patient not taking: Reported on 05/12/2024) 1 each 0   Continuous Glucose Sensor (DEXCOM G7 SENSOR) MISC Use to check glucose continuously, change sensor every 10 days (Patient not taking: Reported on 05/12/2024) 9 each 3   methocarbamol  (ROBAXIN ) 500  MG tablet Take 500-1,000 mg by mouth every 6 (six) hours as needed. (Patient not taking: Reported on 05/12/2024)     No facility-administered medications prior to visit.    ROS: Review of Systems  Constitutional:  Positive for fatigue. Negative for activity change, appetite change, chills and unexpected weight change.  HENT:  Negative for congestion, mouth sores and sinus pressure.   Eyes:  Negative for visual disturbance.  Respiratory:  Negative for cough and chest tightness.   Gastrointestinal:  Positive for constipation. Negative for abdominal pain and nausea.  Genitourinary:  Negative for difficulty urinating, frequency and vaginal pain.  Musculoskeletal:  Negative for back pain and gait problem.  Skin:  Negative for pallor and rash.  Neurological:  Positive for weakness. Negative for dizziness, tremors, numbness and headaches.  Psychiatric/Behavioral:  Negative for confusion, sleep disturbance and suicidal ideas. The patient is nervous/anxious.     Objective:  BP 124/80   Pulse 80   Temp 98 F (36.7 C) (Oral)   Ht 5' 3.5 (1.613 m)   Wt 107 lb (48.5 kg)   SpO2 97%   BMI 18.66 kg/m   BP Readings from Last 3 Encounters:  05/12/24 124/80  04/12/24 138/82  03/17/24 (!) 153/88    Wt Readings from Last 3 Encounters:  05/12/24 107 lb (48.5 kg)  04/12/24 108 lb 12.8 oz (49.4 kg)  03/17/24 110 lb (49.9 kg)    Physical Exam Constitutional:      General: She is not in acute distress.    Appearance: She is well-developed. She is not toxic-appearing or diaphoretic.  HENT:     Head: Normocephalic.     Right Ear: External ear normal.     Left Ear: External ear normal.     Nose: Nose normal.  Eyes:     General:        Right eye: No discharge.        Left eye: No discharge.     Conjunctiva/sclera: Conjunctivae normal.     Pupils: Pupils are equal, round, and reactive to light.  Neck:     Thyroid : No thyromegaly.     Vascular: No JVD.     Trachea: No tracheal deviation.   Cardiovascular:     Rate and Rhythm: Normal rate and regular rhythm.     Heart sounds: Normal heart sounds.  Pulmonary:     Effort: No respiratory distress.     Breath sounds: No stridor. No wheezing.  Abdominal:  General: Bowel sounds are normal. There is no distension.     Palpations: Abdomen is soft. There is no mass.     Tenderness: There is no abdominal tenderness. There is no guarding or rebound.  Musculoskeletal:        General: No tenderness.     Cervical back: Normal range of motion and neck supple. No rigidity.  Lymphadenopathy:     Cervical: No cervical adenopathy.  Skin:    Findings: No erythema or rash.  Neurological:     Mental Status: Mental status is at baseline.     Cranial Nerves: No cranial nerve deficit.     Motor: No abnormal muscle tone.     Coordination: Coordination normal.     Gait: Gait abnormal.     Deep Tendon Reflexes: Reflexes normal.  Psychiatric:        Behavior: Behavior normal.        Thought Content: Thought content normal.        Judgment: Judgment normal.   Using a walker  Lab Results  Component Value Date   WBC 7.4 01/21/2024   HGB 11.9 (L) 01/21/2024   HCT 35.5 (L) 01/21/2024   PLT 427.0 (H) 01/21/2024   GLUCOSE 224 (H) 01/21/2024   CHOL 116 05/10/2020   TRIG 53.0 05/10/2020   HDL 71.30 05/10/2020   LDLDIRECT 48.0 06/18/2022   LDLCALC 34 05/10/2020   ALT 44 (H) 01/21/2024   AST 37 01/21/2024   NA 132 (L) 01/21/2024   K 4.1 01/21/2024   CL 96 01/21/2024   CREATININE 1.10 01/21/2024   BUN 24 (H) 01/21/2024   CO2 30 01/21/2024   TSH 1.01 01/21/2024   INR 1.0 05/12/2023   HGBA1C 6.6 (A) 04/12/2024   MICROALBUR 1.0 01/13/2024    DG Chest 2 View Result Date: 03/17/2024 CLINICAL DATA:  Chest pain.  Fall. EXAM: CHEST - 2 VIEW COMPARISON:  CT dated 05/12/2023. FINDINGS: No focal consolidation, pleural effusion or pneumothorax. Mild cardiomegaly. Age indeterminate lower thoracic compression fracture with anterior wedging, new  since the CT of 06/28/2021, possibly acute or subacute. Correlation with clinical exam and point tenderness recommended. CT may provide better evaluation. IMPRESSION: 1. No active cardiopulmonary disease. 2. Age indeterminate lower thoracic compression fracture. CT may provide better evaluation. Electronically Signed   By: Vanetta Chou M.D.   On: 03/17/2024 18:27    Assessment & Plan:   Problem List Items Addressed This Visit     Bronchitis   Acute - Z pack given      Depression with anxiety   Anxiety is worse - needs Lorazepam  to be given bid on schedule On [employer] , [place]      DJD (degenerative joint disease) of knee   Using Voltaren gel      DM type 2, controlled, with complication (HCC) - Primary   F/u w/Endocrinology - Dr Mercie Using [place] 2 Use SS       Lumbar radiculopathy   Stable Heating pad      Memory problem   Stable         Meds ordered this encounter  Medications   azithromycin  (ZITHROMAX  Z-PAK) 250 MG tablet    Sig: As directed    Dispense:  6 tablet    Refill:  0      Follow-up: Return in about 3 months (around 08/11/2024) for a follow-up visit.  Marolyn Noel, MD

## 2024-05-12 NOTE — Assessment & Plan Note (Signed)
 Stable

## 2024-05-12 NOTE — Assessment & Plan Note (Signed)
 Using Voltaren  gel.

## 2024-05-13 ENCOUNTER — Telehealth: Payer: Self-pay

## 2024-05-13 NOTE — Telephone Encounter (Signed)
 Message left from Cynthia/[facility] staff called requesting the most up to date medications for patient. Med list faxed.

## 2024-05-14 ENCOUNTER — Other Ambulatory Visit: Payer: Self-pay | Admitting: Internal Medicine

## 2024-05-14 MED ORDER — TRAMADOL HCL 50 MG PO TABS: 50.0000 mg | ORAL_TABLET | Freq: Four times a day (QID) | ORAL | 1 refills | Status: DC

## 2024-05-17 ENCOUNTER — Ambulatory Visit

## 2024-05-17 ENCOUNTER — Ambulatory Visit: Payer: Self-pay

## 2024-05-17 NOTE — Telephone Encounter (Signed)
 Patient's spouse called back, requesting assistance in setting up mychart/virtual visit. Nurse provided patient with [employer] number.

## 2024-05-17 NOTE — Telephone Encounter (Signed)
 Pt called back because needing help with MyChart. Pt's spouse successfully was able to change password but had trouble with getting to the home platform. Transferred call to MyChart help desk [phone].

## 2024-05-17 NOTE — Telephone Encounter (Signed)
   FYI Only or Action Required?: FYI only for provider.  Patient was last seen in primary care on 05/12/2024 by Plotnikov, Karlynn GAILS, MD.  Called Nurse Triage reporting Allergic Reaction.  Symptoms began a week ago.  Interventions attempted: OTC medications: Pepto Bismol.  Symptoms are: unchanged.  Triage Disposition: See Physician Within 24 Hours  Patient/caregiver understands and will follow disposition?: yes         Copied from CRM #[id number]. Topic: Clinical - Red Word Triage >> May 17, 2024  9:56 AM [name] wrote: Red Word that prompted transfer to Nurse Triage: reaction to medication zpak, diarrhea Reason for Disposition  [1] MODERATE diarrhea (e.g., 4-6 times / day more than normal) AND [2] present > 48 hours (2 days)  Answer Assessment - Initial Assessment Questions Pt wanting virtual appt. Assisted pt by sending link to reset password. Advised pt how to access appt from home page. Pt stated will get husband to help her sign in.     1. DIARRHEA SEVERITY: How bad is the diarrhea? How many more stools have you had in the past 24 hours than normal?      5 2. ONSET: When did the diarrhea begin?      1 week  3. STOOL DESCRIPTION:  How loose or watery is the diarrhea? What is the stool color? Is there any blood or mucous in the stool?     Pasty stool - 4. VOMITING: Are you also vomiting? If Yes, ask: How many times in the past 24 hours?      no 5. ABDOMEN PAIN: Are you having any abdomen pain? If Yes, ask: What does it feel like? (e.g., crampy, dull, intermittent, constant)      Has feeling that she needs to make a stool pain  6. ABDOMEN PAIN SEVERITY: If present, ask: How bad is the pain?  (e.g., Scale 1-10; mild, moderate, or severe)     Cramping pain: varies  6/10 7. ORAL INTAKE: If vomiting, Have you been able to drink liquids? How much liquids have you had in the past 24 hours?     Yes  8. HYDRATION: Any signs of dehydration? (e.g.,  dry mouth [not just dry lips], too weak to stand, dizziness, new weight loss) When did you last urinate?     Eating ok, mild dizziness / last urinated just before 10. ANTIBIOTIC USE: Are you taking antibiotics now or have you taken antibiotics in the past 2 months?       Stopped Z pak  11. OTHER SYMPTOMS: Do you have any other symptoms? (e.g., fever, blood in stool)       Diarrhea took Pepto Bismol  12. PREGNANCY: Is there any chance you are pregnant? When was your last menstrual period?       N/a  Protocols used: [facility]

## 2024-05-18 ENCOUNTER — Telehealth (INDEPENDENT_AMBULATORY_CARE_PROVIDER_SITE_OTHER): Admitting: Internal Medicine

## 2024-05-18 ENCOUNTER — Encounter: Payer: Self-pay | Admitting: Internal Medicine

## 2024-05-18 ENCOUNTER — Telehealth: Payer: Self-pay

## 2024-05-18 DIAGNOSIS — N183 Chronic kidney disease, stage 3 unspecified: Secondary | ICD-10-CM | POA: Diagnosis not present

## 2024-05-18 DIAGNOSIS — J40 Bronchitis, not specified as acute or chronic: Secondary | ICD-10-CM

## 2024-05-18 DIAGNOSIS — R197 Diarrhea, unspecified: Secondary | ICD-10-CM

## 2024-05-18 DIAGNOSIS — E118 Type 2 diabetes mellitus with unspecified complications: Secondary | ICD-10-CM | POA: Diagnosis not present

## 2024-05-18 MED ORDER — DIPHENOXYLATE-ATROPINE 2.5-0.025 MG PO TABS: 1.0000 | ORAL_TABLET | Freq: Four times a day (QID) | ORAL | 0 refills | Status: AC | PRN

## 2024-05-18 NOTE — Assessment & Plan Note (Signed)
 A side effect due to Zithromax .   Z pack caused diarrhea and abdominal cramps.  She took Z-Pak for 3 days and then stopped.  She took some Pepto-Bismol.  Her stools are pasty now, less frequent.  No abdominal pain.  Prescribed Lomotil  to use as needed.  Stopped Imodium and diarrhea resolved. If loose stools record obtain stool specimen for C. difficile.

## 2024-05-18 NOTE — Progress Notes (Signed)
 Virtual Visit via Video Note  I connected with [name] on 05/18/24 at  4:00 PM EDT by a video enabled telemedicine application and verified that I am speaking with the correct person using two identifiers.   I discussed the limitations of evaluation and management by telemedicine and the availability of in person appointments. The patient expressed understanding and agreed to proceed.  I was located at our [facility] office. The patient was at home. [name] was present in the visit.  Chief Complaint  Patient presents with   Diarrhea     History of Present Illness:  [name] is complaining that the Z pack caused diarrhea and abdominal cramps.  She took Z-Pak for 3 days and then stopped.  She took some Pepto-Bismol.  Her stools are pasty now, less frequent.  No abdominal pain.  Her bronchitis has resolved.   Review of Systems  Constitutional:  Positive for malaise/fatigue. Negative for chills and fever.  HENT:  Negative for congestion.   Respiratory:  Negative for cough.   Cardiovascular:  Positive for orthopnea. Negative for chest pain.  Gastrointestinal:  Positive for diarrhea. Negative for abdominal pain, blood in stool, melena and nausea.  Neurological:  Negative for dizziness.     Observations/Objective: The patient appears to be in no acute distress  Assessment and Plan:  Problem List Items Addressed This Visit     Bronchitis   Symptoms of bronchitis have resolved after 3 days of azithromycin .      CRI (chronic renal insufficiency), stage 3 (moderate)   Continue to hydrate well, especially if diarrhea      Diarrhea - Primary   A side effect due to Zithromax .   Z pack caused diarrhea and abdominal cramps.  She took Z-Pak for 3 days and then stopped.  She took some Pepto-Bismol.  Her stools are pasty now, less frequent.  No abdominal pain.  Prescribed Lomotil  to use as needed.  Stopped Imodium and diarrhea resolved. If loose stools record obtain stool specimen  for C. difficile.      DM type 2, controlled, with complication (HCC)   Continue to monitor blood sugars        Meds ordered this encounter  Medications   diphenoxylate -atropine  (LOMOTIL ) 2.5-0.025 MG tablet    Sig: Take 1-2 tablets by mouth 4 (four) times daily as needed for diarrhea or loose stools.    Dispense:  40 tablet    Refill:  0     Follow Up Instructions:    I discussed the assessment and treatment plan with the patient. The patient was provided an opportunity to ask questions and all were answered. The patient agreed with the plan and demonstrated an understanding of the instructions.   The patient was advised to call back or seek an in-person evaluation if the symptoms worsen or if the condition fails to improve as anticipated.  I provided face-to-face time during this encounter. We were at different locations.   Marolyn Noel, MD

## 2024-05-18 NOTE — Assessment & Plan Note (Signed)
 Continue to monitor blood sugars

## 2024-05-18 NOTE — Assessment & Plan Note (Signed)
 Continue to hydrate well, especially if diarrhea

## 2024-05-18 NOTE — Assessment & Plan Note (Signed)
 Symptoms of bronchitis have resolved after 3 days of azithromycin .

## 2024-05-18 NOTE — Telephone Encounter (Signed)
 Patient is on the virtual call ready to go. I called patient to get her set up before appointment and I left a message to call office back to get her set up.

## 2024-05-20 NOTE — Telephone Encounter (Signed)
 Thanks

## 2024-05-23 ENCOUNTER — Other Ambulatory Visit: Payer: Self-pay | Admitting: Internal Medicine

## 2024-05-24 ENCOUNTER — Ambulatory Visit

## 2024-05-31 ENCOUNTER — Ambulatory Visit (INDEPENDENT_AMBULATORY_CARE_PROVIDER_SITE_OTHER): Admitting: *Deleted

## 2024-05-31 ENCOUNTER — Ambulatory Visit: Admitting: Internal Medicine

## 2024-05-31 DIAGNOSIS — M810 Age-related osteoporosis without current pathological fracture: Secondary | ICD-10-CM | POA: Diagnosis not present

## 2024-05-31 MED ORDER — ROMOSOZUMAB-AQQG 105 MG/1.17ML ~~LOC~~ SOSY
210.0000 mg | PREFILLED_SYRINGE | Freq: Once | SUBCUTANEOUS | Status: AC
  Administered 2024-05-31: 210 mg via SUBCUTANEOUS

## 2024-05-31 NOTE — Progress Notes (Signed)
 Patient is here for evenity  injection #4. Patient received bilateral arm  evenity  injections today. She tolerated injections well. She will return next month for her next Evenity .

## 2024-06-01 DIAGNOSIS — M5459 Other low back pain: Secondary | ICD-10-CM | POA: Diagnosis not present

## 2024-06-01 DIAGNOSIS — M170 Bilateral primary osteoarthritis of knee: Secondary | ICD-10-CM | POA: Diagnosis not present

## 2024-06-01 DIAGNOSIS — F419 Anxiety disorder, unspecified: Secondary | ICD-10-CM | POA: Diagnosis not present

## 2024-06-01 DIAGNOSIS — M4155 Other secondary scoliosis, thoracolumbar region: Secondary | ICD-10-CM | POA: Diagnosis not present

## 2024-06-01 DIAGNOSIS — S22089S Unspecified fracture of T11-T12 vertebra, sequela: Secondary | ICD-10-CM | POA: Diagnosis not present

## 2024-06-01 DIAGNOSIS — E119 Type 2 diabetes mellitus without complications: Secondary | ICD-10-CM | POA: Diagnosis not present

## 2024-06-06 ENCOUNTER — Other Ambulatory Visit: Payer: Self-pay | Admitting: Internal Medicine

## 2024-06-14 ENCOUNTER — Ambulatory Visit: Admitting: Podiatry

## 2024-06-18 ENCOUNTER — Telehealth: Payer: Self-pay

## 2024-06-18 NOTE — Telephone Encounter (Signed)
 Copied from CRM [id number]. Topic: General - Other >> Jun 18, 2024 12:23 PM [place] A wrote: Reason for CRM: Shona requested last visit note [phone] is fax attention Shona

## 2024-06-21 NOTE — Telephone Encounter (Signed)
 Last office notes have been printed and faxed over [place].

## 2024-06-22 ENCOUNTER — Other Ambulatory Visit: Payer: Self-pay | Admitting: Endocrinology

## 2024-06-22 DIAGNOSIS — E119 Type 2 diabetes mellitus without complications: Secondary | ICD-10-CM

## 2024-06-23 ENCOUNTER — Ambulatory Visit: Admitting: Podiatry

## 2024-06-23 ENCOUNTER — Encounter: Payer: Self-pay | Admitting: Podiatry

## 2024-06-23 VITALS — Ht 63.5 in | Wt 107.0 lb

## 2024-06-23 DIAGNOSIS — M79675 Pain in left toe(s): Secondary | ICD-10-CM | POA: Diagnosis not present

## 2024-06-23 DIAGNOSIS — M79674 Pain in right toe(s): Secondary | ICD-10-CM

## 2024-06-23 DIAGNOSIS — B351 Tinea unguium: Secondary | ICD-10-CM | POA: Diagnosis not present
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
## 2024-07-02 ENCOUNTER — Telehealth: Payer: Self-pay

## 2024-07-02 NOTE — Telephone Encounter (Signed)
 Source  [employer] (Other)   Subject     Topic  Clinical - Home Health Verbal Orders    Communication  Reason for CRM: Monique from [facility] unable to verify patient info to access chart.    Patient [name] MRN: [id number],    Marko from [facility], calling for verbal orders for extending home care physical therapy.  1 time per week, for 3 more weeks.  No new concerns.    Monique contact #: [phone]

## 2024-07-05 NOTE — Telephone Encounter (Signed)
 Okay.  Thanks.

## 2024-07-06 ENCOUNTER — Ambulatory Visit

## 2024-07-07 NOTE — Telephone Encounter (Signed)
 Pt insurance did not approve the additional 3 weeks and pt is being discharged.

## 2024-07-12 ENCOUNTER — Telehealth: Payer: Self-pay

## 2024-07-12 DIAGNOSIS — K5904 Chronic idiopathic constipation: Secondary | ICD-10-CM

## 2024-07-12 NOTE — Telephone Encounter (Signed)
 Copied from CRM #[id number]. Topic: Referral - Request for Referral >> Jul 12, 2024 12:54 PM Aleatha C wrote: Did the patient discuss referral with their provider in the last year? No (If No - schedule appointment) (If Yes - send message)  Appointment offered? No  Type of order/referral and detailed reason for visit: G.I doctor  Preference of office, provider, location: no prefernce   If referral order, have you been seen by this specialty before? No (If Yes, this issue or another issue? When? Where?  Can we respond through MyChart? Yes

## 2024-07-14 ENCOUNTER — Ambulatory Visit: Admitting: Endocrinology

## 2024-07-16 NOTE — Addendum Note (Signed)
 Addended by: Ibn Stief V on: 07/16/2024 07:48 AM   Modules accepted: Orders

## 2024-07-16 NOTE — Telephone Encounter (Signed)
 Okay.  Thanks.

## 2024-07-19 ENCOUNTER — Ambulatory Visit: Admitting: Endocrinology

## 2024-07-19 ENCOUNTER — Encounter: Payer: Self-pay | Admitting: Endocrinology

## 2024-07-19 ENCOUNTER — Ambulatory Visit: Payer: Self-pay | Admitting: Endocrinology

## 2024-07-19 VITALS — BP 138/90 | HR 91 | Resp 16 | Ht 63.5 in | Wt 108.8 lb

## 2024-07-19 DIAGNOSIS — E119 Type 2 diabetes mellitus without complications: Secondary | ICD-10-CM | POA: Diagnosis not present

## 2024-07-19 DIAGNOSIS — Z794 Long term (current) use of insulin: Secondary | ICD-10-CM

## 2024-07-19 LAB — POCT GLYCOSYLATED HEMOGLOBIN (HGB A1C): Hemoglobin A1C: 7 % — AB (ref 4.0–5.6)
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
## 2024-07-26 ENCOUNTER — Ambulatory Visit: Payer: Self-pay

## 2024-07-26 ENCOUNTER — Telehealth: Payer: Self-pay | Admitting: Internal Medicine

## 2024-07-26 NOTE — Telephone Encounter (Signed)
 FYI Only or Action Required?: FYI only for provider: 911/EMS dispatched.  Patient was last seen in primary care on 05/18/2024 by Plotnikov, Karlynn GAILS, MD.  Called Nurse Triage reporting Palpitations.  Symptoms began today.  Interventions attempted: Rest, hydration, or home remedies.  Symptoms are: rapidly worsening.  Triage Disposition: Call EMS 911 Now (overriding Go to ED Now (Notify PCP))  Patient/caregiver understands and will follow disposition?: Yes  Reason for Disposition  Feeling weak or lightheaded (e.g., woozy, feeling like they might faint)  Answer Assessment - Initial Assessment Questions Diabetic pt reports that she took one tramadol  this AM and she is experiencing palpitations/fluttering, severe weakness, headache, chest pressure and difficulty speaking. Not slurring, but slow to speak. Highest BS reading this AM was approximately 258 fasted. Denies hx of cardiac diagnoses. Also reports constipation x 3-4 days.  [name] is with patient. This RN contacted 911 on patient's behalf. Assisted living staff heard in background stating [name]. This RN again recommended pt agree to be transported to the hospital for further evaluation, pt agreed.  1. DESCRIPTION: Please describe your heart rate or heartbeat that you are having (e.g., fast/slow, regular/irregular, skipped or extra beats, palpitations)     Fluttering  2. ONSET: When did it start? (e.g., minutes, hours, days)      This morning  Protocols used: Heart Rate and Heartbeat Questions-A-AH  Copied from CRM [id number]. Topic: Clinical - Red Word Triage >> Jul 26, 2024 10:45 AM Avram MATSU wrote: Red Word that prompted transfer to Nurse Triage: very constipated, heart palpitation really bad.  Patient feels terrible and feels like she's going to pass out.

## 2024-07-26 NOTE — Telephone Encounter (Unsigned)
 Copied from CRM #[id number]. Topic: Clinical - Medication Refill >> Jul 26, 2024 11:19 AM Mesmerise C wrote: Medication: LORazepam  (ATIVAN ) 1 MG tablet   traMADol  (ULTRAM ) 50 MG tablet  Has the patient contacted their pharmacy? Yes (Agent: If no, request that the patient contact the pharmacy for the refill. If patient does not wish to contact the pharmacy document the reason why and proceed with request.) (Agent: If yes, when and what did the pharmacy advise?)  This is the patient's preferred pharmacy:  [facility] [place], [place] - [address] [place] [address] Phone: [phone] Fax: [phone]    Is this the correct pharmacy for this prescription? Yes If no, delete pharmacy and type the correct one.   Has the prescription been filled recently? No  Is the patient out of the medication? Yes  Has the patient been seen for an appointment in the last year OR does the patient have an upcoming appointment? Yes  Can we respond through MyChart? Yes  Agent: Please be advised that Rx refills may take up to 3 business days. We ask that you follow-up with your pharmacy.  Patient has been without for a couple days and has had bad anxiety

## 2024-07-26 NOTE — Telephone Encounter (Signed)
 Copied from CRM #[id number]. Topic: Clinical - Medication Refill >> Jul 26, 2024 11:19 AM Mesmerise C wrote: Medication: LORazepam  (ATIVAN ) 1 MG tablet   traMADol  (ULTRAM ) 50 MG tablet   This is the patient's preferred pharmacy:  [facility]

## 2024-07-27 ENCOUNTER — Ambulatory Visit: Payer: Self-pay

## 2024-07-27 ENCOUNTER — Other Ambulatory Visit: Payer: Self-pay | Admitting: Internal Medicine

## 2024-07-27 ENCOUNTER — Telehealth: Payer: Self-pay

## 2024-07-27 MED ORDER — TRAMADOL HCL 50 MG PO TABS: 50.0000 mg | ORAL_TABLET | Freq: Four times a day (QID) | ORAL | 3 refills | Status: DC | PRN

## 2024-07-27 MED ORDER — LORAZEPAM 1 MG PO TABS: 1.0000 mg | ORAL_TABLET | Freq: Three times a day (TID) | ORAL | 3 refills | Status: DC | PRN

## 2024-07-27 NOTE — Telephone Encounter (Signed)
Ok Thx 

## 2024-07-27 NOTE — Telephone Encounter (Signed)
 Rx were done - will fax Pt's address is too long for e scribing. Pls correct. Thx

## 2024-07-27 NOTE — Telephone Encounter (Signed)
 Copied from CRM #[id number]. Topic: Clinical - Medication Question >> Jul 27, 2024  1:51 PM Aisha D wrote: Reason for CRM: Pt's son Chad wants to know if the medication refill request for the LORazepam  (ATIVAN ) 1 MG tablet and the traMADol  (ULTRAM ) 50 MG tablet can be approved today if possible. Chad would like a callback with an update. RA:[id number].

## 2024-07-27 NOTE — Progress Notes (Signed)
 Rx were done  Address is too long for e scribing. Pls correct. Thx

## 2024-07-27 NOTE — Telephone Encounter (Signed)
 FYI Only or Action Required?: FYI only for provider: ED advised. Refused ED-prefers to have medications refilled.   Patient was last seen in primary care on 05/18/2024 by Plotnikov, Karlynn GAILS, MD.  Called Nurse Triage reporting Anxiety.  Symptoms began yesterday.  Interventions attempted: Other: EMS cared for patient yesterday 07/26/24 for similar symptoms; no other interventions noted.  Symptoms are: gradually worsening.  Triage Disposition: Go to ED Now (or PCP Triage)  Patient/caregiver understands and will follow disposition?: No, refuses disposition Reason for Disposition  [1] SEVERE anxiety (e.g., extremely anxious with intense emotional symptoms such as feeling of unreality, urge to flee, unable to calm down; unable to cope or function) AND [2] not better after 10 minutes of reassurance and Care Advice  Answer Assessment - Initial Assessment Questions Nursing Director, Renda, called in from patients assisted living facility on behalf of patient; States that patient has had multiple anxiety attacks over the last couple of days and that the assisted living facility and patient's husband have been calling in refills for patient's tramadol  and Ativan  since 11/28 and they have still not been refilled. According to Nursing Director EMS was called out to assisted living yesterday d/t patient experiencing anxiety attack with chest pain and patient was cleared of any other causes to chest pain and advised to refill medications. Pt is currently experiencing similar symptoms, but Nursing Director states they are worse today. Advised to have husband take patient to ED, but states he would prefer to get her medications refilled and avoid the ED.   1. CONCERN: Did anything happen that prompted you to call today?      Anxiety attack  2. ANXIETY SYMPTOMS: Can you describe how you (your loved one; patient) have been feeling? (e.g., tense, restless, panicky, anxious, keyed up, overwhelmed, sense of  impending doom).      Anxious,chest pain, hyperventilating-O2 sats maintaining  3. ONSET: How long have you been feeling this way? (e.g., hours, days, weeks)     Patient has been having anxiety attacks over the past couple of days  4. SEVERITY: How would you rate the level of anxiety? (e.g., 0 - 10; or mild, moderate, severe).     Severe  5. FUNCTIONAL IMPAIRMENT: How have these feelings affected your ability to do daily activities? Have you had more difficulty than usual doing your normal daily activities? (e.g., getting better, same, worse; self-care, school, work, interactions)     Unknown  6. HISTORY: Have you felt this way before? Have you ever been diagnosed with an anxiety problem in the past? (e.g., generalized anxiety disorder, panic attacks, PTSD). If Yes, ask: How was this problem treated? (e.g., medicines, counseling, etc.)     Yes-ambulance called to assisted living yesterday 07/26/24 for similar symptoms; takes Ativan  and Tramadol  for anxiety but does not currently have medications   7. RISK OF HARM - SUICIDAL IDEATION: Do you ever have thoughts of hurting or killing yourself? If Yes, ask:  Do you have these feelings now? Do you have a plan on how you would do this?     Nurse director does not express concerns for SI r/t patient  8. TREATMENT:  What has been done so far to treat this anxiety? (e.g., medicines, relaxation strategies). What has helped?     Medications-not available to pt at this time  9. THERAPIST: Do you have a counselor or therapist? If Yes, ask: What is their name?     Unknown  10. POTENTIAL TRIGGERS: Do you drink caffeinated  beverages (e.g., coffee, colas, teas), and how much daily? Do you drink alcohol or use any drugs? Have you started any new medicines recently?       Unknown  11. PATIENT SUPPORT: Who is with you now? Who do you live with? Do you have family or friends who you can talk to?        Husband, Assisted  living staff  12. OTHER SYMPTOMS: Do you have any other symptoms? (e.g., feeling depressed, trouble concentrating, trouble sleeping, trouble breathing, palpitations or fast heartbeat, chest pain, sweating, nausea, or diarrhea)       Chest pain, trouble breathing but stable according to Nurse director   13. PREGNANCY: Is there any chance you are pregnant? When was your last menstrual period?       NA  Protocols used: Anxiety and Panic Attack-A-AH  Copied from CRM #[id number]. Topic: Clinical - Red Word Triage >> Jul 27, 2024 10:36 AM Aleatha BROCKS wrote: Red Word that prompted transfer to Nurse Triage: [facility] is calling because patient has been having axiety attacks due to prescription not being filled

## 2024-07-27 NOTE — Telephone Encounter (Unsigned)
 Copied from CRM #[id number]. Topic: Clinical - Medication Refill >> Jul 27, 2024  8:58 AM Amy B wrote: Medication: LORazepam  (ATIVAN ) 1 MG tablet traMADol  (ULTRAM ) 50 MG tablet   Has the patient contacted their pharmacy? Yes (Agent: If no, request that the patient contact the pharmacy for the refill. If patient does not wish to contact the pharmacy document the reason why and proceed with request.) (Agent: If yes, when and what did the pharmacy advise?)  This is the patient's preferred pharmacy:  [facility] - [place], [place] - [address] [place] [address] Phone: [phone] Fax: [phone]  Is this the correct pharmacy for this prescription? Yes If no, delete pharmacy and type the correct one.   Has the prescription been filled recently? No  Is the patient out of the medication? Yes  Has the patient been seen for an appointment in the last year OR does the patient have an upcoming appointment? Yes  Can we respond through MyChart? No  Agent: Please be advised that Rx refills may take up to 3 business days. We ask that you follow-up with your pharmacy.

## 2024-07-27 NOTE — Telephone Encounter (Signed)
 Rx were faxed Address is too long for e scribing. Pls correct. Thx

## 2024-07-29 ENCOUNTER — Encounter: Payer: Self-pay | Admitting: Family Medicine

## 2024-07-29 ENCOUNTER — Telehealth: Payer: Self-pay

## 2024-07-29 ENCOUNTER — Ambulatory Visit: Admitting: Family Medicine

## 2024-07-29 VITALS — BP 142/84 | HR 89 | Temp 98.4°F

## 2024-07-29 DIAGNOSIS — R5383 Other fatigue: Secondary | ICD-10-CM

## 2024-07-29 DIAGNOSIS — Z79899 Other long term (current) drug therapy: Secondary | ICD-10-CM

## 2024-07-29 DIAGNOSIS — F331 Major depressive disorder, recurrent, moderate: Secondary | ICD-10-CM

## 2024-07-29 DIAGNOSIS — R1319 Other dysphagia: Secondary | ICD-10-CM

## 2024-07-29 DIAGNOSIS — R300 Dysuria: Secondary | ICD-10-CM

## 2024-07-29 DIAGNOSIS — K5909 Other constipation: Secondary | ICD-10-CM

## 2024-07-29 DIAGNOSIS — K219 Gastro-esophageal reflux disease without esophagitis: Secondary | ICD-10-CM

## 2024-07-29 DIAGNOSIS — F13930 Sedative, hypnotic or anxiolytic use, unspecified with withdrawal, uncomplicated: Secondary | ICD-10-CM

## 2024-07-29 DIAGNOSIS — M5416 Radiculopathy, lumbar region: Secondary | ICD-10-CM

## 2024-07-29 DIAGNOSIS — F411 Generalized anxiety disorder: Secondary | ICD-10-CM

## 2024-07-29 LAB — COMPREHENSIVE METABOLIC PANEL WITH GFR
ALT: 29 U/L (ref 0–35)
AST: 35 U/L (ref 0–37)
Albumin: 3.7 g/dL (ref 3.5–5.2)
Alkaline Phosphatase: 123 U/L — ABNORMAL HIGH (ref 39–117)
BUN: 17 mg/dL (ref 6–23)
CO2: 30 meq/L (ref 19–32)
Calcium: 9.2 mg/dL (ref 8.4–10.5)
Chloride: 97 meq/L (ref 96–112)
Creatinine, Ser: 0.98 mg/dL (ref 0.40–1.20)
GFR: 53.59 mL/min — ABNORMAL LOW (ref >60.00)
Glucose, Bld: 133 mg/dL — ABNORMAL HIGH (ref 70–99)
Potassium: 3.7 meq/L (ref 3.5–5.1)
Sodium: 133 meq/L — ABNORMAL LOW (ref 135–145)
Total Bilirubin: 0.5 mg/dL (ref 0.2–1.2)
Total Protein: 6.5 g/dL (ref 6.0–8.3)

## 2024-07-29 LAB — CBC WITH DIFFERENTIAL/PLATELET
Basophils Absolute: 0.1 K/uL (ref 0.0–0.1)
Basophils Relative: 0.9 % (ref 0.0–3.0)
Eosinophils Absolute: 0.2 K/uL (ref 0.0–0.7)
Eosinophils Relative: 2.7 % (ref 0.0–5.0)
HCT: 34.6 % — ABNORMAL LOW (ref 36.0–46.0)
Hemoglobin: 11.5 g/dL — ABNORMAL LOW (ref 12.0–15.0)
Lymphocytes Relative: 41.8 % (ref 12.0–46.0)
Lymphs Abs: 3 K/uL (ref 0.7–4.0)
MCHC: 33.3 g/dL (ref 30.0–36.0)
MCV: 97.8 fl (ref 78.0–100.0)
Monocytes Absolute: 0.7 K/uL (ref 0.1–1.0)
Monocytes Relative: 9.8 % (ref 3.0–12.0)
Neutro Abs: 3.2 K/uL (ref 1.4–7.7)
Neutrophils Relative %: 44.8 % (ref 43.0–77.0)
Platelets: 344 K/uL (ref 150.0–400.0)
RBC: 3.54 Mil/uL — ABNORMAL LOW (ref 3.87–5.11)
RDW: 13.3 % (ref 11.5–15.5)
WBC: 7.2 K/uL (ref 4.0–10.5)

## 2024-07-29 LAB — POCT URINALYSIS DIP (CLINITEK)
Bilirubin, UA: NEGATIVE
Blood, UA: NEGATIVE
Glucose, UA: NEGATIVE mg/dL
Ketones, POC UA: NEGATIVE mg/dL
Leukocytes, UA: NEGATIVE
Nitrite, UA: NEGATIVE
POC PROTEIN,UA: NEGATIVE
Spec Grav, UA: 1.01 (ref 1.010–1.025)
Urobilinogen, UA: 0.2 U/dL
pH, UA: 6 (ref 5.0–8.0)

## 2024-07-29 LAB — VITAMIN B12: Vitamin B-12: 494 pg/mL (ref 211–911)

## 2024-07-29 LAB — TSH: TSH: 1.72 u[IU]/mL (ref 0.35–5.50)

## 2024-07-29 MED ORDER — TRAMADOL HCL 50 MG PO TABS: 50.0000 mg | ORAL_TABLET | Freq: Four times a day (QID) | ORAL | 3 refills | Status: DC | PRN

## 2024-07-29 MED ORDER — LORAZEPAM 1 MG PO TABS: 1.0000 mg | ORAL_TABLET | Freq: Three times a day (TID) | ORAL | 3 refills | Status: DC | PRN

## 2024-07-29 NOTE — Telephone Encounter (Signed)
 Patient called in stated she just doen't feel right. She had missed a few doses of her Xanax but has been back on in since yesterday, and had a dose this morning. Patient stated she feels like she is smothering and just knows something isn't right. She stated she started having burning when she pees since yesterday and it is not getting better.   Scheduled patient for 3pm appt with CANDIE Ku to evaluate possible UTI and anxiety. Spoke with husband and he agreed they would be able to make appt.

## 2024-07-29 NOTE — Progress Notes (Unsigned)
 Acute Office Visit  Subjective:     Patient ID: [name], female    DOB: 04-19-1941, 83 y.o.   MRN: [id number]  Chief Complaint  Patient presents with   Acute Visit    C/o burning when urinating this week also reports hard to urinate  Sugar is 220    HPI  Discussed the use of AI scribe software for clinical note transcription with the patient, who gave verbal consent to proceed.  History of Present Illness [name] is an 83 year old female with anxiety and chronic pain who presents with symptoms of medication withdrawal and chest discomfort. She is accompanied by her husband today who is supplementing history.  Lives at [facility]  Medication withdrawal symptoms - Ran out of lorazepam  and tramadol  - Experiencing sensation of heaviness on chest and racing heart - Body feels drained with significant fatigue, barely able to talk - Feels as though she is sinking and unable to get a grip on things - No suicidal ideation but expresses need for help  Anxiety and sleep disturbance - History of anxiety managed with lorazepam  - Difficulty sleeping with mind wandering and trouble falling asleep  Chest discomfort - Sensation of heaviness on chest - Racing heart - No chest pain reported  Oropharyngeal and gastrointestinal symptoms - Dry mouth and sensation of choking, especially when swallowing food - Eating soft foods to manage swallowing difficulty - Takes rabeprazole  for reflux  Headache and dental pain - Headaches worsened over the weekend - Generalized dental pain  Ocular symptoms - Frequent watering of the eye, attributed to stress  Bowel dysfunction - Uses Miralax and Linzess  for bowel movements - Recent difficulty with bowel movements despite adequate fluid intake     ROS Per HPI      Objective:    BP (!) 142/84   Pulse 89   Temp 98.4 F (36.9 C) (Temporal)   SpO2 95%    Physical Exam Vitals and nursing note reviewed.   Constitutional:      General: She is not in acute distress.    Comments: Frail elderly  HENT:     Head: Normocephalic and atraumatic.     Right Ear: External ear normal.     Left Ear: External ear normal.     Nose: Nose normal.     Mouth/Throat:     Mouth: Mucous membranes are moist.     Pharynx: Oropharynx is clear.  Eyes:     Extraocular Movements: Extraocular movements intact.     Pupils: Pupils are equal, round, and reactive to light.  Cardiovascular:     Rate and Rhythm: Normal rate and regular rhythm.     Pulses: Normal pulses.     Heart sounds: Normal heart sounds.     No friction rub.  Pulmonary:     Effort: Pulmonary effort is normal. No respiratory distress.     Breath sounds: No wheezing, rhonchi or rales.  Musculoskeletal:     Cervical back: Normal range of motion.     Right lower leg: No edema.     Left lower leg: No edema.     Comments: In wheelchair  for visit  Lymphadenopathy:     Cervical: No cervical adenopathy.  Neurological:     General: No focal deficit present.     Mental Status: She is alert and oriented to person, place, and time.  Psychiatric:        Mood and Affect: Mood is anxious.  Behavior: Behavior is cooperative.        Thought Content: Thought content normal.     Results for orders placed or performed in visit on 07/29/24  Urine Culture   Specimen: Urine  Result Value Ref Range   Source: URINE    Status: FINAL    Result: No Growth   CBC with Differential/Platelet  Result Value Ref Range   WBC 7.2 4.0 - 10.5 K/uL   RBC 3.54 (L) 3.87 - 5.11 Mil/uL   Hemoglobin 11.5 (L) 12.0 - 15.0 g/dL   HCT 65.3 (L) 63.9 - 53.9 %   MCV 97.8 78.0 - 100.0 fl   MCHC 33.3 30.0 - 36.0 g/dL   RDW 86.6 88.4 - 84.4 %   Platelets 344.0 150.0 - 400.0 K/uL   Neutrophils Relative % 44.8 43.0 - 77.0 %   Lymphocytes Relative 41.8 12.0 - 46.0 %   Monocytes Relative 9.8 3.0 - 12.0 %   Eosinophils Relative 2.7 0.0 - 5.0 %   Basophils Relative 0.9 0.0 -  3.0 %   Neutro Abs 3.2 1.4 - 7.7 K/uL   Lymphs Abs 3.0 0.7 - 4.0 K/uL   Monocytes Absolute 0.7 0.1 - 1.0 K/uL   Eosinophils Absolute 0.2 0.0 - 0.7 K/uL   Basophils Absolute 0.1 0.0 - 0.1 K/uL  Comprehensive metabolic panel with GFR  Result Value Ref Range   Sodium 133 (L) 135 - 145 mEq/L   Potassium 3.7 3.5 - 5.1 mEq/L   Chloride 97 96 - 112 mEq/L   CO2 30 19 - 32 mEq/L   Glucose, Bld 133 (H) 70 - 99 mg/dL   BUN 17 6 - 23 mg/dL   Creatinine, Ser 9.01 0.40 - 1.20 mg/dL   Total Bilirubin 0.5 0.2 - 1.2 mg/dL   Alkaline Phosphatase 123 (H) 39 - 117 U/L   AST 35 0 - 37 U/L   ALT 29 0 - 35 U/L   Total Protein 6.5 6.0 - 8.3 g/dL   Albumin  3.7 3.5 - 5.2 g/dL   GFR 46.40 (L) >39.99 mL/min   Calcium  9.2 8.4 - 10.5 mg/dL  TSH  Result Value Ref Range   TSH 1.72 0.35 - 5.50 uIU/mL  Vitamin B12  Result Value Ref Range   Vitamin B-12 494 211 - 911 pg/mL  VITAMIN D  25 Hydroxy (Vit-D Deficiency, Fractures)  Result Value Ref Range   VITD 38.89 30.00 - 100.00 ng/mL  POCT URINALYSIS DIP (CLINITEK)  Result Value Ref Range   Color, UA yellow yellow   Clarity, UA clear clear   Glucose, UA negative negative mg/dL   Bilirubin, UA negative negative   Ketones, POC UA negative negative mg/dL   Spec Grav, UA 8.989 8.989 - 1.025   Blood, UA negative negative   pH, UA 6.0 5.0 - 8.0   POC PROTEIN,UA negative negative, trace   Urobilinogen, UA 0.2 0.2 or 1.0 E.U./dL   Nitrite, UA Negative Negative   Leukocytes, UA Negative Negative        Assessment & Plan:   Assessment and Plan Assessment & Plan Benzodiazepine withdrawal, uncomplicated Withdrawal symptoms from abrupt cessation of lorazepam  and tramadol . Symptoms expected to improve with resumed medications. - Changed lorazepam  and tramadol  to as needed basis to decrease from rigid schedule to help avoid this in the future - Ensure hydration and rest. - Monitor symptoms for 24-48 hours.  Dysphagia due to gastroesophageal reflux  disease Sensation of food sticking in throat likely due to stress and GERD.  Symptoms improve with water . - Continue rabeprazole  daily. - Encouraged soft foods and hydration.  Chronic constipation Persistent constipation. On Linzess  and Colace. - Encouraged increased fluid intake. - Continue Linzess  and Colace.  Dysuria, rule out urinary tract infection Burning sensation during urination, possibly due to dehydration or irritation. No visible hematuria. - Sent urine for culture. - Monitor for infection signs, consider antibiotics if culture positive.  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 - recent anxiety exacerbation with running out of ativan  that she has been taking regularly for years - Will check labs today to rule out other cause - Changed ativan  order to 1mg  BID prn increased anxiety  Lumbar Radiculopahty - Mild exacerbation after running out of tramadol  - Changed tramadol  order to 50mg  once every 6 hours prn for mod-severe pain  Completed order change form for [facility] and copies sent back with patient and husband    Orders Placed This Encounter  Procedures   Urine Culture    Standing Status:   Future    Number of Occurrences:   1    Expected Date:   07/29/2024    Expiration Date:   07/29/2025   CBC with Differential/Platelet    Release to patient:   Immediate [1]   Comprehensive metabolic panel with GFR    Release to patient:   Immediate [1]   TSH   Vitamin B12   VITAMIN D  25 Hydroxy (Vit-D Deficiency, Fractures)   POCT URINALYSIS DIP (CLINITEK)     Meds ordered this encounter  Medications   traMADol  (ULTRAM ) 50 MG tablet    Sig: Take 1 tablet (50 mg total) by mouth every 6 (six) hours as needed for moderate pain (pain score 4-6) or severe pain (pain score 7-10).    Dispense:  120 tablet    Refill:  3   LORazepam  (ATIVAN ) 1 MG tablet    Sig: Take 1 tablet (1 mg total) by mouth every 8 (eight) hours as needed for anxiety.    Dispense:  90 tablet     Refill:  3   I personally spent a total of 38 minutes in the care of the patient today including performing a medically appropriate exam/evaluation, counseling and educating, placing orders, and documenting clinical information in the EHR.   Return if symptoms worsen or fail to improve.  Corean LITTIE Ku, FNP

## 2024-07-29 NOTE — Patient Instructions (Signed)
 We are checking labs for you today.  I do think the way that you are feeling is definitely coming from missing the controlled medications that you were on.  I have made changes to your orders for [facility] for you to have these medications as needed, this means you are going to have to ask for them when you need them.  Will be in touch with lab results once they are received.  Follow-up with Dr. Garald as scheduled

## 2024-07-30 ENCOUNTER — Telehealth: Payer: Self-pay

## 2024-07-30 ENCOUNTER — Ambulatory Visit: Payer: Self-pay | Admitting: Family Medicine

## 2024-07-30 LAB — URINE CULTURE: Result:: NO GROWTH

## 2024-07-30 LAB — VITAMIN D 25 HYDROXY (VIT D DEFICIENCY, FRACTURES): VITD: 38.89 ng/mL (ref 30.00–100.00)

## 2024-07-30 NOTE — Telephone Encounter (Signed)
 Copied from CRM [id number]. Topic: Clinical - Medical Advice >> Jul 30, 2024  2:32 PM Aisha D wrote: Reason for CRM: Pt is requesting to speak with Corean Alvia BRISTLE, or her nurse in regards to what she was seen for yesterday and is still experiencing some issues. Pt would like a callback today if possible.

## 2024-07-31 ENCOUNTER — Emergency Department (HOSPITAL_COMMUNITY)
Admission: EM | Admit: 2024-07-31 | Discharge: 2024-07-31 | Disposition: A | Discharge status: Skilled Nursing Facility | Source: Skilled Nursing Facility | Attending: Emergency Medicine | Admitting: Emergency Medicine

## 2024-07-31 ENCOUNTER — Emergency Department (HOSPITAL_COMMUNITY)

## 2024-07-31 ENCOUNTER — Other Ambulatory Visit: Payer: Self-pay

## 2024-07-31 DIAGNOSIS — F419 Anxiety disorder, unspecified: Secondary | ICD-10-CM

## 2024-07-31 DIAGNOSIS — Z794 Long term (current) use of insulin: Secondary | ICD-10-CM | POA: Diagnosis not present

## 2024-07-31 DIAGNOSIS — R457 State of emotional shock and stress, unspecified: Secondary | ICD-10-CM | POA: Diagnosis not present

## 2024-07-31 DIAGNOSIS — R000 Tachycardia, unspecified: Secondary | ICD-10-CM | POA: Diagnosis not present

## 2024-07-31 DIAGNOSIS — R531 Weakness: Secondary | ICD-10-CM | POA: Diagnosis not present

## 2024-07-31 DIAGNOSIS — R0602 Shortness of breath: Secondary | ICD-10-CM | POA: Diagnosis not present

## 2024-07-31 DIAGNOSIS — I10 Essential (primary) hypertension: Secondary | ICD-10-CM | POA: Diagnosis not present

## 2024-07-31 DIAGNOSIS — Z7982 Long term (current) use of aspirin: Secondary | ICD-10-CM | POA: Diagnosis not present

## 2024-07-31 DIAGNOSIS — I499 Cardiac arrhythmia, unspecified: Secondary | ICD-10-CM | POA: Diagnosis not present

## 2024-07-31 DIAGNOSIS — M40205 Unspecified kyphosis, thoracolumbar region: Secondary | ICD-10-CM | POA: Diagnosis not present

## 2024-07-31 DIAGNOSIS — R0789 Other chest pain: Secondary | ICD-10-CM | POA: Diagnosis not present

## 2024-07-31 DIAGNOSIS — I959 Hypotension, unspecified: Secondary | ICD-10-CM | POA: Diagnosis not present

## 2024-07-31 DIAGNOSIS — R079 Chest pain, unspecified: Secondary | ICD-10-CM | POA: Diagnosis not present

## 2024-07-31 LAB — RESP PANEL BY RT-PCR (RSV, FLU A&B, COVID)  RVPGX2
Influenza A by PCR: NEGATIVE
Influenza B by PCR: NEGATIVE
Resp Syncytial Virus by PCR: NEGATIVE
SARS Coronavirus 2 by RT PCR: NEGATIVE

## 2024-07-31 LAB — CBC WITH DIFFERENTIAL/PLATELET
Abs Immature Granulocytes: 0.03 K/uL (ref 0.00–0.07)
Basophils Absolute: 0.1 K/uL (ref 0.0–0.1)
Basophils Relative: 1 %
Eosinophils Absolute: 0.3 K/uL (ref 0.0–0.5)
Eosinophils Relative: 3 %
HCT: 32 % — ABNORMAL LOW (ref 36.0–46.0)
Hemoglobin: 10.5 g/dL — ABNORMAL LOW (ref 12.0–15.0)
Immature Granulocytes: 0 %
Lymphocytes Relative: 25 %
Lymphs Abs: 2.2 K/uL (ref 0.7–4.0)
MCH: 32.3 pg (ref 26.0–34.0)
MCHC: 32.8 g/dL (ref 30.0–36.0)
MCV: 98.5 fL (ref 80.0–100.0)
Monocytes Absolute: 0.9 K/uL (ref 0.1–1.0)
Monocytes Relative: 10 %
Neutro Abs: 5.3 K/uL (ref 1.7–7.7)
Neutrophils Relative %: 61 %
Platelets: 349 K/uL (ref 150–400)
RBC: 3.25 MIL/uL — ABNORMAL LOW (ref 3.87–5.11)
RDW: 13.4 % (ref 11.5–15.5)
WBC: 8.8 K/uL (ref 4.0–10.5)
nRBC: 0 % (ref 0.0–0.2)

## 2024-07-31 LAB — BASIC METABOLIC PANEL WITH GFR
Anion gap: 9 (ref 5–15)
BUN: 19 mg/dL (ref 8–23)
CO2: 25 mmol/L (ref 22–32)
Calcium: 8.1 mg/dL — ABNORMAL LOW (ref 8.9–10.3)
Chloride: 99 mmol/L (ref 98–111)
Creatinine, Ser: 0.94 mg/dL (ref 0.44–1.00)
GFR, Estimated: >60 mL/min (ref >60)
Glucose, Bld: 180 mg/dL — ABNORMAL HIGH (ref 70–99)
Potassium: 4.1 mmol/L (ref 3.5–5.1)
Sodium: 133 mmol/L — ABNORMAL LOW (ref 135–145)

## 2024-07-31 LAB — TROPONIN I (HIGH SENSITIVITY): Troponin I (High Sensitivity): 8 ng/L (ref <18)

## 2024-07-31 MED ORDER — HYDROXYZINE HCL 25 MG PO TABS
25.0000 mg | ORAL_TABLET | Freq: Once | ORAL | Status: AC
  Administered 2024-07-31: 25 mg via ORAL
  Filled 2024-07-31: qty 1

## 2024-07-31 MED ORDER — HYDROXYZINE HCL 25 MG PO TABS: 25.0000 mg | ORAL_TABLET | Freq: Three times a day (TID) | ORAL | 0 refills | Status: AC | PRN

## 2024-07-31 NOTE — ED Provider Notes (Signed)
 [facility] AT [facility] Provider Note   CSN: [id number] Arrival date & time: 07/31/24  1121     Patient presents with: Anxiety   [name] is a 83 y.o. female with past medical history of diabetes, depression and anxiety, GERD, IBS, who presents the emergency department for evaluation of anxiety.  Patient reports she has been on Ativan  long-term and was out of her medication approximately 4 days ago.  She lives at [facility], and received her medicine yesterday, but still reports feeling anxious.  She describes her anxiety as body aches, chest tightness/heaviness, shortness of breath and weakness.  She denies any fever, chills, abdominal pain, nausea or vomiting.  She states some residents at [facility] were recently sick, but she was not near them.    Anxiety       Prior to Admission medications   Medication Sig Start Date End Date Taking? Authorizing Provider  Abaloparatide  (TYMLOS ) 3120 MCG/1.56ML SOPN Inject 80 mcg into the skin daily. 12/22/23   Hudnall, Ludie SAUNDERS, MD  aspirin  EC 81 MG tablet Take 1 tablet (81 mg total) by mouth daily. 06/15/23   Arlice Reichert, MD  BD PEN NEEDLE NANO U/F 32G X 4 MM MISC USE AS DIRECTED TWICE DAILY. 03/06/22   Von Pacific, MD  busPIRone  (BUSPAR ) 15 MG tablet Take 1 tablet by mouth 2 (two) times daily. 01/28/16   [provider]  cetirizine (ZYRTEC) 10 MG tablet Take 10 mg by mouth daily.    [provider]  cholecalciferol (VITAMIN D3) 25 MCG (1000 UNIT) tablet Take 1,000 Units by mouth daily.    [provider]  Continuous Glucose Receiver (DEXCOM G7 RECEIVER) DEVI 1 Device by Does not apply route continuous. 03/05/24   Thapa, Sudan, MD  Continuous Glucose Sensor (DEXCOM G7 SENSOR) MISC Use to check glucose continuously, change sensor every 10 days 03/05/24   Thapa, Sudan, MD  Continuous Glucose Sensor (FREESTYLE LIBRE 3 PLUS SENSOR) MISC CHANGE SENSOR EVERY 15 DAYS. 06/22/24   Thapa,  Sudan, MD  Continuous Glucose Sensor (FREESTYLE LIBRE 3 SENSOR) MISC 1 Device by Does not apply route every 14 (fourteen) days. Apply 1 sensor on upper arm every 14 days for continuous glucose monitoring 01/13/24   Thapa, Sudan, MD  cyclobenzaprine  (FLEXERIL ) 5 MG tablet Take 5 mg by mouth 3 (three) times daily as needed. 04/16/23   [provider]  diphenoxylate -atropine  (LOMOTIL ) 2.5-0.025 MG tablet Take 1-2 tablets by mouth 4 (four) times daily as needed for diarrhea or loose stools. 05/18/24   Plotnikov, Karlynn GAILS, MD  docusate sodium  (COLACE) 100 MG capsule Take 1 capsule (100 mg total) by mouth 2 (two) times daily. Patient taking differently: Take 100 mg by mouth 2 (two) times daily. Taking once a day in PM 05/16/23   Dahal, Reichert, MD  famotidine  (PEPCID ) 40 MG tablet Take 1 tablet (40 mg total) by mouth at bedtime. 11/05/23   Plotnikov, Aleksei V, MD  insulin  lispro (HUMALOG  KWIKPEN) 100 UNIT/ML KwikPen Take 1 unit with breakfast, 3 units with lunch and 2 units with supper, take insulin  before eating and need to eat within 15 minutes of insulin  injection. 04/12/24   Thapa, Sudan, MD  Insulin  Pen Needle (ASSURE ID DUO PRO PEN NEEDLES) 31G X 5 MM MISC 1 each by Does not apply route daily. 12/22/23   Hudnall, Ludie SAUNDERS, MD  L-Methylfolate (DEPLIN) 15 MG TABS Take 15 mg by mouth daily. For treatment of depression    [provider]  linaclotide  (LINZESS ) 290 MCG CAPS capsule Take 1 capsule (290 mcg total) by mouth daily. 03/24/24   Plotnikov, Aleksei V, MD  LORazepam  (ATIVAN ) 1 MG tablet Take 1 tablet (1 mg total) by mouth every 8 (eight) hours as needed for anxiety. 07/29/24   Alvia Corean CROME, FNP  Magnesium Hydroxide (MILK OF MAGNESIA PO) Take 1-2 capsules by mouth daily as needed.    [provider]  methocarbamol  (ROBAXIN ) 500 MG tablet Take 500-1,000 mg by mouth every 6 (six) hours as needed. Patient taking differently: Take 500-1,000 mg by mouth every 6 (six) hours as  needed. Twice a day 02/13/24   [provider]  [facility] VERIO test strip Use as instructed to check blood sugar once a day 10/01/21   Von Pacific, MD  polyethylene glycol powder (GLYCOLAX/MIRALAX) 17 GM/SCOOP powder Take 1 Container by mouth as needed.    [provider]  RA ACETAMINOPHEN  EX ST 500 MG tablet Take 500 mg by mouth every 6 (six) hours as needed. 12/29/23   [provider]  RABEprazole  (ACIPHEX ) 20 MG tablet Take 1 tablet (20 mg total) by mouth daily. 04/07/24   Plotnikov, Aleksei V, MD  Rimegepant Sulfate (NURTEC) 75 MG TBDP Take 1 tablet (75 mg total) by mouth as needed. 12/02/22   Millikan, Megan, NP  traMADol  (ULTRAM ) 50 MG tablet Take 1 tablet (50 mg total) by mouth every 6 (six) hours as needed for moderate pain (pain score 4-6) or severe pain (pain score 7-10). 07/29/24   Alvia Corean CROME, FNP  TRINTELLIX  20 MG TABS Take 20 mg by mouth daily. 08/15/16   [provider]  VOLTAREN 1 % GEL APPLY 2 GRAMS TO KNEE 2-3 TIMES DAILY PRN 05/25/15   [provider]  zolmitriptan  (ZOMIG ) 5 MG nasal solution Place 1 spray into the nose as needed for migraine (in the nostril as directed). 11/17/23   Plotnikov, Aleksei V, MD    Allergies: Amlodipine, Celebrex  [celecoxib ], Topamax  [topiramate ], Amitiza  [lubiprostone ], Azithromycin , Carafate  [sucralfate ], Jardiance  [empagliflozin ], Lactulose , Codeine, Gabapentin, Hydrocodone , Meloxicam, and Pravastatin sodium    Review of Systems  Psychiatric/Behavioral:  The patient is nervous/anxious.     Updated Vital Signs BP (!) 158/82 (BP Location: Left Arm)   Pulse 88   Temp 98.2 F (36.8 C) (Oral)   Resp 18   SpO2 100%   Physical Exam Vitals and nursing note reviewed.  Constitutional:      Appearance: Normal appearance.  HENT:     Head: Normocephalic and atraumatic.     Mouth/Throat:     Mouth: Mucous membranes are moist.  Eyes:     General: No scleral icterus.       Right eye: No discharge.         Left eye: No discharge.     Conjunctiva/sclera: Conjunctivae normal.  Cardiovascular:     Rate and Rhythm: Normal rate and regular rhythm.     Pulses: Normal pulses.  Pulmonary:     Effort: Pulmonary effort is normal.     Breath sounds: Normal breath sounds.  Abdominal:     General: There is no distension.     Tenderness: There is no abdominal tenderness.  Musculoskeletal:        General: No deformity.     Cervical back: Normal range of motion.  Skin:    General: Skin is warm and dry.     Capillary Refill: Capillary refill takes less than 2 seconds.  Neurological:  Mental Status: She is alert.     Motor: No weakness.  Psychiatric:        Mood and Affect: Mood normal.     (all labs ordered are listed, but only abnormal results are displayed) Labs Reviewed  CBC WITH DIFFERENTIAL/PLATELET - Abnormal; Notable for the following components:      Result Value   RBC 3.25 (*)    Hemoglobin 10.5 (*)    HCT 32.0 (*)    All other components within normal limits  BASIC METABOLIC PANEL WITH GFR - Abnormal; Notable for the following components:   Sodium 133 (*)    Glucose, Bld 180 (*)    Calcium  8.1 (*)    All other components within normal limits  RESP PANEL BY RT-PCR (RSV, FLU A&B, COVID)  RVPGX2  TROPONIN I (HIGH SENSITIVITY)  TROPONIN I (HIGH SENSITIVITY)    EKG: None  Radiology: DG Chest 2 View Result Date: 07/31/2024 CLINICAL DATA:  Short of breath for 4 days EXAM: DG CHEST 2V COMPARISON:  03/17/2024 FINDINGS: Frontal and lateral views of the chest demonstrate an unremarkable cardiac silhouette. No acute airspace disease, effusion, or pneumothorax. No acute bony abnormalities. 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 IMPRESSION: 1. Stable chest, no acute process. Electronically Signed   By: Ozell Daring M.D.   On: 07/31/2024 13:14    Procedures   Medications Ordered in the ED  hydrOXYzine  (ATARAX )  tablet 25 mg (has no administration in time range)    Clinical Course as of 07/31/24 1430  Sat Jul 31, 2024  1221 Basic labs, troponin, chest x-ray, EKG, respiratory panel ordered.  Awaiting results.  Patient's vital signs are stable. [GD]  1427 Initial workup is reassuring.  Chest x-ray shows no acute process.  Respiratory panel is negative.  No leukocytosis.  Troponin is 8.  Delta troponin not indicated given patient's symptoms have been present for greater than 4 hours.  I do believe patient symptoms are likely secondary to rebound anxiety from being out of her Ativan  for 4 days.  I will give her a dose of hydroxyzine  while in the emergency department and advised her to follow-up with her PCP. [GD]  1430 Patient's vital signs are stable.  Patient is appropriate for discharge at this time. [GD]    Clinical Course User Index [GD] Torrence Marry RAMAN, PA-C                                Medical Decision Making Amount and/or Complexity of Data Reviewed Labs: ordered. Radiology: ordered.  Risk Prescription drug management.   83 year old female who presents emergency department for evaluation of anxiety and chest tightness.  Differential diagnosis: STEMI, NSTEMI, unstable angina, anxiety, flu, COVID, RSV, pneumonia  Final diagnoses:  None    ED Discharge Orders     None          Torrence Marry RAMAN, PA-C 07/31/24 1431

## 2024-07-31 NOTE — ED Triage Notes (Signed)
 Pt BIB [facility] from skilled nursing ([facility]). EMS got called out for chest wall pain/anxiety. Last 4 days out of her ativan , started it again this morning, but still doesn't feel well. Alert and oriented x4.   Vitals WNL EKG normal per EMS

## 2024-07-31 NOTE — Discharge Instructions (Addendum)
 It was a pleasure taking care of you today. You were seen in the Emergency Department for evaluation of anxiety and chest tightness. Your work-up was reassuring. Your CT/Xray/Labs showed no acute cardiopulmonary abnormality or evidence of infection.  As discussed, I do believe your symptoms are likely secondary to a rebound anxiety after being out of your medication for multiple days.  I did give you a one-time dose of a medication called hydroxyzine , which does help to treat anxiety. Refer to the attached documentation for further management of your symptoms. Follow up with your PCP if your symptoms continue.  Please return to the ER if you experience chest pain, trouble breathing, intractable nausea/vomiting or any other life threatening illnesses.

## 2024-08-02 ENCOUNTER — Ambulatory Visit

## 2024-08-02 NOTE — Telephone Encounter (Signed)
Attempted to reach patient, unable to reach. LVM.

## 2024-08-02 NOTE — Telephone Encounter (Signed)
 Noted, viewed ED note. Maybe call and check on her?

## 2024-08-03 NOTE — Telephone Encounter (Signed)
 Noted.  Lorazepam  was renewed on 07/29/2024.  Thanks

## 2024-08-09 ENCOUNTER — Ambulatory Visit

## 2024-08-09 DIAGNOSIS — M810 Age-related osteoporosis without current pathological fracture: Secondary | ICD-10-CM

## 2024-08-09 MED ORDER — ROMOSOZUMAB-AQQG 105 MG/1.17ML ~~LOC~~ SOSY
210.0000 mg | PREFILLED_SYRINGE | Freq: Once | SUBCUTANEOUS | Status: AC
  Administered 2024-08-09: 14:00:00 210 mg via SUBCUTANEOUS

## 2024-08-09 NOTE — Progress Notes (Signed)
 Patient is here for evenity  injection #5. Patient received bilateral arm [place] evenity  injections today. She tolerated injections well. She will return next month for her next Evenity .

## 2024-08-10 ENCOUNTER — Other Ambulatory Visit: Payer: Self-pay | Admitting: Internal Medicine

## 2024-08-10 DIAGNOSIS — F411 Generalized anxiety disorder: Secondary | ICD-10-CM

## 2024-08-10 DIAGNOSIS — M5416 Radiculopathy, lumbar region: Secondary | ICD-10-CM

## 2024-08-10 NOTE — Telephone Encounter (Unsigned)
 Copied from CRM #[id number]. Topic: Clinical - Medication Refill >> Aug 10, 2024 11:13 AM Deleta S wrote: Medication: traMADol  (ULTRAM ) 50 MG tablet and LORazepam  (ATIVAN ) 1 MG tablet  Has the patient contacted their pharmacy? No (Agent: If no, request that the patient contact the pharmacy for the refill. If patient does not wish to contact the pharmacy document the reason why and proceed with request.) (Agent: If yes, when and what did the pharmacy advise?)  This is the patient's preferred pharmacy:  [facility] - [place], [place] - [address] [place] [address] Phone: [phone] Fax: [phone]    Is this the correct pharmacy for this prescription? Yes If no, delete pharmacy and type the correct one.   Has the prescription been filled recently? Yes  Is the patient out of the medication? No  Has the patient been seen for an appointment in the last year OR does the patient have an upcoming appointment? Yes  Can we respond through MyChart? No  Agent: Please be advised that Rx refills may take up to 3 business days. We ask that you follow-up with your pharmacy.

## 2024-08-11 ENCOUNTER — Ambulatory Visit: Payer: Self-pay

## 2024-08-11 NOTE — Telephone Encounter (Signed)
 Attempted to call pt x2. VM left for pt. Will attempt to contact pt at a later time.   Message from China J sent at 08/11/2024 11:36 AM EST  Reason for Triage: Severe constipation causing abdominal pain. Patient has not had a bowel movement in a few days.

## 2024-08-11 NOTE — Telephone Encounter (Signed)
 Attempted to call pt x 3. VM left for pt. Will route to clinic for follow up   Message from China J sent at 08/11/2024 11:36 AM EST  Reason for Triage: Severe constipation causing abdominal pain. Patient has not had a bowel movement in a few days.

## 2024-08-11 NOTE — Telephone Encounter (Signed)
 Attempted to call pt x1. VM left for pt. Will attempt to contact pt at a later time.   Message from [place] J sent at 08/11/2024 11:36 AM EST  Reason for Triage: Severe constipation causing abdominal pain. Patient has not had a bowel movement in a few days.

## 2024-08-13 NOTE — Telephone Encounter (Signed)
 2026 Evenity  benefits due to re-verify on [employer].

## 2024-08-16 ENCOUNTER — Ambulatory Visit: Admitting: Internal Medicine

## 2024-08-17 ENCOUNTER — Other Ambulatory Visit: Payer: Self-pay | Admitting: Family

## 2024-08-17 DIAGNOSIS — M5416 Radiculopathy, lumbar region: Secondary | ICD-10-CM

## 2024-08-17 DIAGNOSIS — F411 Generalized anxiety disorder: Secondary | ICD-10-CM

## 2024-08-17 MED ORDER — TRAMADOL HCL 50 MG PO TABS: 50.0000 mg | ORAL_TABLET | Freq: Four times a day (QID) | ORAL | 3 refills | Status: DC | PRN

## 2024-08-17 MED ORDER — LORAZEPAM 1 MG PO TABS: 1.0000 mg | ORAL_TABLET | Freq: Three times a day (TID) | ORAL | 3 refills | Status: DC | PRN

## 2024-08-23 ENCOUNTER — Encounter: Payer: Self-pay | Admitting: *Deleted

## 2024-08-23 ENCOUNTER — Ambulatory Visit: Payer: Self-pay

## 2024-08-23 NOTE — Telephone Encounter (Signed)
 FYI Only or Action Required?: Action required by provider: request for appointment and declines UC.  Patient was last seen in primary care on 07/29/2024 by Alvia Corean CROME, FNP.  Called Nurse Triage reporting Cough.  Symptoms began several days ago.  Interventions attempted: OTC medications: Mucinex DM.  Symptoms are: gradually worsening.  Triage Disposition: See Physician Within 24 Hours  Patient/caregiver understands and will follow disposition?: No, wishes to speak with PCP   Copied from CRM #[id number]. Topic: Clinical - Red Word Triage >> Aug 23, 2024  2:06 PM Hadassah PARAS wrote: Red Word that prompted transfer to Nurse Triage: Pt has a bad cough, congestion, worsening aches and pain in chest,throat,and headache. Transferred to NT Reason for Disposition  SEVERE coughing spells (e.g., whooping sound after coughing, vomiting after coughing)  Answer Assessment - Initial Assessment Questions No available appts today. Advised UC now and ED/911 if symptoms worsen: severe diff breathing, chest pain >5 min, faint, drooling/spitting out saliva/fluids. Patient verbalized understanding.   Patient declines UC and reports driver won't be able to take today. Patient requesting appt.  1. ONSET: When did the cough begin?      2 days 2. SEVERITY: How bad is the cough today?      moderate 3. SPUTUM: Describe the color of your sputum (e.g., none, dry cough; clear, white, yellow, green)     green 4. HEMOPTYSIS: Are you coughing up any blood? If Yes, ask: How much? (e.g., flecks, streaks, tablespoons, etc.)     no 5. DIFFICULTY BREATHING: Are you having difficulty breathing? If Yes, ask: How bad is it? (e.g., mild, moderate, severe)      moderate 6. FEVER: Do you have a fever? If Yes, ask: What is your temperature, how was it measured, and when did it start?     Denies fever chills n/v 7. CARDIAC HISTORY: Do you have any history of heart disease? (e.g., heart attack,  congestive heart failure)      no 8. LUNG HISTORY: Do you have any history of lung disease?  (e.g., pulmonary embolus, asthma, emphysema)     no 9. PE RISK FACTORS: Do you have a history of blood clots? (or: recent major surgery, recent prolonged travel, bedridden)     no 10. OTHER SYMPTOMS: Do you have any other symptoms? (e.g., runny nose, wheezing, chest pain) Sore throat,  Denies diff breathing, wheezing, chest pain, faint  Protocols used: Cough - Acute Productive-A-AH

## 2024-08-23 NOTE — Telephone Encounter (Signed)
 Patient is ready for scheduling on or after: 09/10/24 BUY AND BILL  Out-of-pocket cost due at time of visit: $507  Primary: [employer] Evenity  co-insurance: 20% (approximately $507) Admin fee co-insurance: $0  Deductible: n/a  Prior Auth: not required   Call ref #: [id number] HTA phone #: [phone]   ** This summary of benefits is an estimation of the patient's out-of-pocket cost. Exact cost may vary based on individual plan coverage.

## 2024-08-24 MED ORDER — TRAMADOL HCL 50 MG PO TABS: 50.0000 mg | ORAL_TABLET | Freq: Four times a day (QID) | ORAL | 3 refills | Status: DC | PRN

## 2024-08-24 MED ORDER — LORAZEPAM 1 MG PO TABS: 1.0000 mg | ORAL_TABLET | Freq: Three times a day (TID) | ORAL | 3 refills | Status: DC | PRN

## 2024-08-25 NOTE — Telephone Encounter (Signed)
 Please schedule office visit with any provider.  In the meantime use DayQuil/NyQuil, Ricola cough drops.  Thanks

## 2024-08-25 NOTE — Telephone Encounter (Signed)
 Tried to reach pt no answer... Please advise pt on the following upon her call back to the clinic per her PCP  Please schedule office visit with any provider.  In the meantime use DayQuil/NyQuil, Ricola cough drops.  Thanks

## 2024-09-01 ENCOUNTER — Ambulatory Visit: Admitting: Internal Medicine

## 2024-09-02 ENCOUNTER — Ambulatory Visit (INDEPENDENT_AMBULATORY_CARE_PROVIDER_SITE_OTHER)

## 2024-09-02 ENCOUNTER — Ambulatory Visit: Payer: Self-pay | Admitting: Internal Medicine

## 2024-09-02 ENCOUNTER — Encounter: Payer: Self-pay | Admitting: Internal Medicine

## 2024-09-02 ENCOUNTER — Ambulatory Visit: Admitting: Internal Medicine

## 2024-09-02 VITALS — BP 138/90 | HR 95 | Temp 98.9°F | Ht 61.5 in | Wt 105.0 lb

## 2024-09-02 DIAGNOSIS — J40 Bronchitis, not specified as acute or chronic: Secondary | ICD-10-CM

## 2024-09-02 DIAGNOSIS — F418 Other specified anxiety disorders: Secondary | ICD-10-CM | POA: Diagnosis not present

## 2024-09-02 DIAGNOSIS — E118 Type 2 diabetes mellitus with unspecified complications: Secondary | ICD-10-CM

## 2024-09-02 MED ORDER — CEFDINIR 300 MG PO CAPS: 300.0000 mg | ORAL_CAPSULE | Freq: Two times a day (BID) | ORAL | 1 refills | Status: AC

## 2024-09-02 MED ORDER — CYCLOBENZAPRINE HCL 5 MG PO TABS: 5.0000 mg | ORAL_TABLET | Freq: Three times a day (TID) | ORAL | 3 refills | Status: AC | PRN

## 2024-09-02 NOTE — Assessment & Plan Note (Signed)
 Anxiety is worse - needs Lorazepam  to be given bid on schedule On [employer] , [place]

## 2024-09-02 NOTE — Assessment & Plan Note (Addendum)
 New w/sinusitis Start Omnicef  po/probiotic

## 2024-09-02 NOTE — Assessment & Plan Note (Signed)
 Continue to monitor blood sugars. On Humalog  tid. On Libre 3 Seeing Dr Mercie

## 2024-09-02 NOTE — Progress Notes (Signed)
 "  Subjective:  Patient ID: [name], female    DOB: 09-Nov-1940  Age: 84 y.o. MRN: [id number]  CC: Nasal Congestion (Symptoms have been going on for a few weeks now. Sore throat, cough, congestion in chest as well as nasal, headaches, achy feeling. Has not had a fever but has felt warm. )   HPI [name] presents for URI sx's x 3 weeks. Not better by much. Dry cough... C/o wt loss. C/o green nasal d/c  Outpatient Medications Prior to Visit  Medication Sig Dispense Refill   pantoprazole  (PROTONIX ) 40 MG tablet Take 40 mg by mouth 2 (two) times daily.     Abaloparatide  (TYMLOS ) 3120 MCG/1.56ML SOPN Inject 80 mcg into the skin daily. 1.56 mL 12   aspirin  EC 81 MG tablet Take 1 tablet (81 mg total) by mouth daily.     BD PEN NEEDLE NANO U/F 32G X 4 MM MISC USE AS DIRECTED TWICE DAILY. 200 each 3   busPIRone  (BUSPAR ) 15 MG tablet Take 1 tablet by mouth 2 (two) times daily.     cetirizine (ZYRTEC) 10 MG tablet Take 10 mg by mouth daily.     cholecalciferol (VITAMIN D3) 25 MCG (1000 UNIT) tablet Take 1,000 Units by mouth daily.     Continuous Glucose Receiver (DEXCOM G7 RECEIVER) DEVI 1 Device by Does not apply route continuous. 1 each 0   Continuous Glucose Sensor (DEXCOM G7 SENSOR) MISC Use to check glucose continuously, change sensor every 10 days 9 each 3   Continuous Glucose Sensor (FREESTYLE LIBRE 3 PLUS SENSOR) MISC CHANGE SENSOR EVERY 15 DAYS. 6 each 3   Continuous Glucose Sensor (FREESTYLE LIBRE 3 SENSOR) MISC 1 Device by Does not apply route every 14 (fourteen) days. Apply 1 sensor on upper arm every 14 days for continuous glucose monitoring 5 each 1   diphenoxylate -atropine  (LOMOTIL ) 2.5-0.025 MG tablet Take 1-2 tablets by mouth 4 (four) times daily as needed for diarrhea or loose stools. 40 tablet 0   docusate sodium  (COLACE) 100 MG capsule Take 1 capsule (100 mg total) by mouth 2 (two) times daily. (Patient taking differently: Take 100 mg by mouth 2 (two) times daily. Taking  once a day in PM)     famotidine  (PEPCID ) 40 MG tablet Take 1 tablet (40 mg total) by mouth at bedtime. 90 tablet 3   hydrOXYzine  (ATARAX ) 25 MG tablet Take 1 tablet (25 mg total) by mouth 3 (three) times daily as needed. 15 tablet 0   insulin  lispro (HUMALOG  KWIKPEN) 100 UNIT/ML KwikPen Take 1 unit with breakfast, 3 units with lunch and 2 units with supper, take insulin  before eating and need to eat within 15 minutes of insulin  injection. 15 mL 11   Insulin  Pen Needle (ASSURE ID DUO PRO PEN NEEDLES) 31G X 5 MM MISC 1 each by Does not apply route daily. 30 each 24   L-Methylfolate (DEPLIN) 15 MG TABS Take 15 mg by mouth daily. For treatment of depression     linaclotide  (LINZESS ) 290 MCG CAPS capsule Take 1 capsule (290 mcg total) by mouth daily. 30 capsule 5   LORazepam  (ATIVAN ) 1 MG tablet Take 1 tablet (1 mg total) by mouth every 8 (eight) hours as needed for anxiety. Take 1 tablet by mouth every 8 (eight) hours as needed for anxiety. 90 tablet 3   Magnesium Hydroxide (MILK OF MAGNESIA PO) Take 1-2 capsules by mouth daily as needed.     methocarbamol  (ROBAXIN ) 500 MG tablet Take 500-1,000  mg by mouth every 6 (six) hours as needed. (Patient taking differently: Take 500-1,000 mg by mouth every 6 (six) hours as needed. Twice a day)     ONETOUCH VERIO test strip Use as instructed to check blood sugar once a day 100 each 3   polyethylene glycol powder (GLYCOLAX/MIRALAX) 17 GM/SCOOP powder Take 1 Container by mouth as needed.     RA ACETAMINOPHEN  EX ST 500 MG tablet Take 500 mg by mouth every 6 (six) hours as needed.     RABEprazole  (ACIPHEX ) 20 MG tablet Take 1 tablet (20 mg total) by mouth daily. 90 tablet 3   Rimegepant Sulfate (NURTEC) 75 MG TBDP Take 1 tablet (75 mg total) by mouth as needed. 4 tablet 0   traMADol  (ULTRAM ) 50 MG tablet Take 1 tablet (50 mg total) by mouth every 6 (six) hours as needed for moderate pain (pain score 4-6) or severe pain (pain score 7-10). 120 tablet 3   TRINTELLIX  20  MG TABS Take 20 mg by mouth daily.  2   VOLTAREN 1 % GEL APPLY 2 GRAMS TO KNEE 2-3 TIMES DAILY PRN  1   zolmitriptan  (ZOMIG ) 5 MG nasal solution Place 1 spray into the nose as needed for migraine (in the nostril as directed). 6 each 5   cyclobenzaprine  (FLEXERIL ) 5 MG tablet Take 5 mg by mouth 3 (three) times daily as needed.     No facility-administered medications prior to visit.    ROS: Review of Systems  Constitutional:  Positive for chills, fatigue and unexpected weight change. Negative for activity change and appetite change.  HENT:  Positive for congestion, rhinorrhea, sinus pressure, sinus pain and sore throat. Negative for mouth sores.   Eyes:  Negative for visual disturbance.  Respiratory:  Positive for cough. Negative for chest tightness and shortness of breath.   Gastrointestinal:  Positive for constipation. Negative for abdominal pain and nausea.  Genitourinary:  Negative for difficulty urinating, frequency and vaginal pain.  Musculoskeletal:  Positive for arthralgias, back pain and gait problem.  Skin:  Negative for pallor and rash.  Neurological:  Positive for weakness and headaches. Negative for dizziness, tremors and numbness.  Psychiatric/Behavioral:  Negative for confusion, sleep disturbance and suicidal ideas.     Objective:  BP (!) 138/90   Pulse 95   Temp 98.9 F (37.2 C) (Oral)   Ht 5' 1.5 (1.562 m)   Wt 105 lb (47.6 kg)   SpO2 91%   BMI 19.52 kg/m   BP Readings from Last 3 Encounters:  09/02/24 (!) 138/90  07/31/24 (!) 148/86  07/29/24 (!) 142/84    Wt Readings from Last 3 Encounters:  09/02/24 105 lb (47.6 kg)  07/19/24 108 lb 12.8 oz (49.4 kg)  06/23/24 107 lb (48.5 kg)    Physical Exam Constitutional:      General: She is not in acute distress.    Appearance: She is well-developed. She is not toxic-appearing or diaphoretic.  HENT:     Head: Normocephalic.     Right Ear: External ear normal.     Left Ear: External ear normal.     Nose:  Nose normal.  Eyes:     General:        Right eye: No discharge.        Left eye: No discharge.     Conjunctiva/sclera: Conjunctivae normal.     Pupils: Pupils are equal, round, and reactive to light.  Neck:     Thyroid : No thyromegaly.  Vascular: No JVD.     Trachea: No tracheal deviation.  Cardiovascular:     Rate and Rhythm: Normal rate and regular rhythm.     Heart sounds: Normal heart sounds.  Pulmonary:     Effort: No respiratory distress.     Breath sounds: No stridor. No wheezing or rhonchi.  Abdominal:     General: Bowel sounds are normal. There is no distension.     Palpations: Abdomen is soft. There is no mass.     Tenderness: There is no abdominal tenderness. There is no guarding or rebound.  Musculoskeletal:        General: No tenderness.     Cervical back: Normal range of motion and neck supple. No rigidity.  Lymphadenopathy:     Cervical: No cervical adenopathy.  Skin:    Findings: No erythema or rash.  Neurological:     Mental Status: Mental status is at baseline.     Cranial Nerves: No cranial nerve deficit.     Motor: Weakness present. No abnormal muscle tone.     Coordination: Coordination abnormal.     Gait: Gait abnormal.     Deep Tendon Reflexes: Reflexes normal.  Psychiatric:        Behavior: Behavior normal.        Thought Content: Thought content normal.        Judgment: Judgment normal.   Using a walker  Lab Results  Component Value Date   WBC 8.8 07/31/2024   HGB 10.5 (L) 07/31/2024   HCT 32.0 (L) 07/31/2024   PLT 349 07/31/2024   GLUCOSE 180 (H) 07/31/2024   CHOL 116 05/10/2020   TRIG 53.0 05/10/2020   HDL 71.30 05/10/2020   LDLDIRECT 48.0 06/18/2022   LDLCALC 34 05/10/2020   ALT 29 07/29/2024   AST 35 07/29/2024   NA 133 (L) 07/31/2024   K 4.1 07/31/2024   CL 99 07/31/2024   CREATININE 0.94 07/31/2024   BUN 19 07/31/2024   CO2 25 07/31/2024   TSH 1.72 07/29/2024   INR 1.0 05/12/2023   HGBA1C 7.0 (A) 07/19/2024    MICROALBUR 1.0 01/13/2024    DG Chest 2 View Result Date: 07/31/2024 CLINICAL DATA:  Short of breath for 4 days EXAM: DG CHEST 2V COMPARISON:  03/17/2024 FINDINGS: Frontal and lateral views of the chest demonstrate an unremarkable cardiac silhouette. No acute airspace disease, effusion, or pneumothorax. No acute bony abnormalities. 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 IMPRESSION: 1. Stable chest, no acute process. Electronically Signed   By: Ozell Daring M.D.   On: 07/31/2024 13:14    Assessment & Plan:   Problem List Items Addressed This Visit     DM type 2, controlled, with complication (HCC) - Primary   Continue to monitor blood sugars. On Humalog  tid. On Libre 3 Seeing Dr Mercie      Depression with anxiety   Anxiety is worse - needs Lorazepam  to be given bid on schedule On Trintellix , [place]      Bronchitis   New w/sinusitis Start Omnicef  po/probiotic      Relevant Orders   DG Chest 2 View      Meds ordered this encounter  Medications   cefdinir  (OMNICEF ) 300 MG capsule    Sig: Take 1 capsule (300 mg total) by mouth 2 (two) times daily.    Dispense:  14 capsule    Refill:  1   cyclobenzaprine  (FLEXERIL ) 5 MG tablet  Sig: Take 1 tablet (5 mg total) by mouth 3 (three) times daily as needed.    Dispense:  30 tablet    Refill:  3      Follow-up: Return in about 3 months (around 12/01/2024) for a follow-up visit.  Marolyn Noel, MD "

## 2024-09-03 NOTE — Telephone Encounter (Signed)
 Copied from CRM #[id number]. Topic: Clinical - Lab/Test Results >> Sep 03, 2024  3:05 PM Amy B wrote: Reason for CRM: Patient requests a call back for the results of her chest x-ray.  She is having difficulty with her MyChart and cannot access.  [phone]

## 2024-09-06 ENCOUNTER — Telehealth: Payer: Self-pay

## 2024-09-06 NOTE — Telephone Encounter (Signed)
 Copied from CRM #[id number]. Topic: Clinical - Prescription Issue >> Sep 06, 2024  1:55 PM Macario HERO wrote: Reason for CRM: Zebedee calling from [facility] - Pharmacy has questions regarding medications: Received a request for cyclobenzaprine  (FLEXERIL ) 5 MG tablet [[id number]] and wanted to know if she is still taking the 10MG ? --  Also if she is continuing to take methocarbamol  (ROBAXIN ) 500 MG tablet [[id number]]? -- Phone: [phone] - Fax: [phone]

## 2024-09-06 NOTE — Telephone Encounter (Signed)
 Tried to reach pt in regards to chest x-ray results. Provider has stated Dear [name], Your chest x-ray is clear.  Plan-as we discussed.  It was good to see you and [place]. Sincerely, AP

## 2024-09-09 ENCOUNTER — Ambulatory Visit: Admitting: Internal Medicine

## 2024-09-13 NOTE — Telephone Encounter (Addendum)
 I was able to inform the pharmacy of the following information per PCP Cyclobenzaprine  10 mg and Robaxin  were discontinued. Thanks

## 2024-09-13 NOTE — Telephone Encounter (Signed)
 Cyclobenzaprine  10 mg and Robaxin  were discontinued.  Thanks

## 2024-09-14 ENCOUNTER — Ambulatory Visit: Admitting: Podiatry

## 2024-09-15 ENCOUNTER — Ambulatory Visit: Admitting: *Deleted

## 2024-09-15 DIAGNOSIS — M810 Age-related osteoporosis without current pathological fracture: Secondary | ICD-10-CM | POA: Diagnosis not present

## 2024-09-15 MED ORDER — ROMOSOZUMAB-AQQG 105 MG/1.17ML ~~LOC~~ SOSY
210.0000 mg | PREFILLED_SYRINGE | Freq: Once | SUBCUTANEOUS | Status: AC
  Administered 2024-09-15: 210 mg via SUBCUTANEOUS

## 2024-09-15 NOTE — Progress Notes (Signed)
 Patient is here for evenity  injection #6. Patient received bilateral arm [place] evenity  injections today. She tolerated injections well. She will return next month for her next Evenity .

## 2024-09-22 ENCOUNTER — Ambulatory Visit: Admitting: Podiatry

## 2024-09-23 ENCOUNTER — Ambulatory Visit: Admitting: Internal Medicine

## 2024-09-24 ENCOUNTER — Other Ambulatory Visit: Payer: Self-pay | Admitting: Internal Medicine

## 2024-09-24 DIAGNOSIS — F411 Generalized anxiety disorder: Secondary | ICD-10-CM

## 2024-09-24 NOTE — Telephone Encounter (Signed)
 [name] with the living facility needs refill sent today- pharmacy will be closed tomorrow due to the weather- patient have 4 tablets left    Copied from CRM #[id number]. Topic: Clinical - Medication Refill >> Sep 24, 2024  2:18 PM Tiffini S wrote: Medication:  LORazepam  (ATIVAN ) 1 MG tablet  Per Leona with living facility need today as the pharmacy will close tomorrow    Has the patient contacted their pharmacy? Yes (Agent: If no, request that the patient contact the pharmacy for the refill. If patient does not wish to contact the pharmacy document the reason why and proceed with request.) (Agent: If yes, when and what did the pharmacy advise?)  This is the patient's preferred pharmacy:  [facility] [place], [place] - [address] [place] [address] Phone: [phone] Fax: [phone]  Is this the correct pharmacy for this prescription? Yes If no, delete pharmacy and type the correct one.   Has the prescription been filled recently? Yes  Is the patient out of the medication? Yes- only have 4 tablets left  Has the patient been seen for an appointment in the last year OR does the patient have an upcoming appointment? Yes  Can we respond through MyChart? No, please call [phone]  Agent: Please be advised that Rx refills may take up to 3 business days. We ask that you follow-up with your pharmacy.

## 2024-09-24 NOTE — Telephone Encounter (Signed)
Message has been sent to PCP.  

## 2024-09-24 NOTE — Telephone Encounter (Unsigned)
 Copied from CRM #[id number]. Topic: Clinical - Medication Refill >> Sep 24, 2024  2:18 PM Tiffini S wrote: Medication:  LORazepam  (ATIVAN ) 1 MG tablet  Per Leona with living facility need today as the pharmacy will close tomorrow    Has the patient contacted their pharmacy? Yes (Agent: If no, request that the patient contact the pharmacy for the refill. If patient does not wish to contact the pharmacy document the reason why and proceed with request.) (Agent: If yes, when and what did the pharmacy advise?)  This is the patient's preferred pharmacy:  [facility] - [place], [place] - [address] [place] [address] Phone: [phone] Fax: [phone]  Is this the correct pharmacy for this prescription? Yes If no, delete pharmacy and type the correct one.   Has the prescription been filled recently? Yes  Is the patient out of the medication? Yes- only have 4 tablets left  Has the patient been seen for an appointment in the last year OR does the patient have an upcoming appointment? Yes  Can we respond through MyChart? No, please call [phone]  Agent: Please be advised that Rx refills may take up to 3 business days. We ask that you follow-up with your pharmacy.

## 2024-09-27 ENCOUNTER — Other Ambulatory Visit: Payer: Self-pay | Admitting: Internal Medicine

## 2024-09-27 DIAGNOSIS — F411 Generalized anxiety disorder: Secondary | ICD-10-CM

## 2024-09-27 MED ORDER — LORAZEPAM 1 MG PO TABS: 1.0000 mg | ORAL_TABLET | Freq: Three times a day (TID) | ORAL | 3 refills | Status: DC | PRN

## 2024-09-27 NOTE — Progress Notes (Unsigned)
 Maria, Please correct demographics on the rx - too long to be emailed Thx

## 2024-09-27 NOTE — Telephone Encounter (Signed)
 [name], Please fax the prescription to her pharmacy.  I am not able to email the prescription due to the fact that her identification that goes on the prescription is too long (too many words).  I think it happened after she moved to the assisted living facility.  Now she is back at home.  Please change it to our default ID format. Thanks,

## 2024-09-28 NOTE — Telephone Encounter (Unsigned)
 Copied from CRM [id number]. Topic: Clinical - Prescription Issue >> Sep 27, 2024  1:59 PM Terri G wrote: Reason for CRM: Renda from [facility] calling regarding patient medication LORazepam  (ATIVAN ) 1 MG tablet and traMADol  (ULTRAM ) 50 MG tablet. Stated when patient doesn't have her medication she starts having panic attacks and so we please get these 2 medication refilled today if possible. Callback number [phone]

## 2024-09-29 ENCOUNTER — Other Ambulatory Visit: Payer: Self-pay

## 2024-09-29 ENCOUNTER — Telehealth: Payer: Self-pay

## 2024-09-29 DIAGNOSIS — M5416 Radiculopathy, lumbar region: Secondary | ICD-10-CM

## 2024-09-29 DIAGNOSIS — F411 Generalized anxiety disorder: Secondary | ICD-10-CM

## 2024-09-29 MED ORDER — TRAMADOL HCL 50 MG PO TABS: 50.0000 mg | ORAL_TABLET | Freq: Four times a day (QID) | ORAL | 3 refills | Status: AC | PRN

## 2024-09-29 MED ORDER — LORAZEPAM 1 MG PO TABS: 1.0000 mg | ORAL_TABLET | Freq: Three times a day (TID) | ORAL | 3 refills | Status: AC | PRN

## 2024-09-29 NOTE — Telephone Encounter (Signed)
 Script signed and faxed to [facility] with confirmation. Precious at [facility] informed as well

## 2024-09-29 NOTE — Telephone Encounter (Signed)
 Prescriptiosn have since been printed, signed, and faxed to [facility]. Called and informed Precious at [facility] as well

## 2024-09-29 NOTE — Telephone Encounter (Signed)
 Copied from CRM #[id number]. Topic: Clinical - Medication Question >> Sep 29, 2024  8:16 AM Ahlexyia S wrote: Reason for CRM: Deandra with [facility] called in requesting update on medication  LORazepam  (ATIVAN ) 1 MG tablet and traMADol  (ULTRAM ) 50 MG tablet. Per pt chart medication refill was approved but pharmacy is needing a printed script. There hasn't been a response from provider just yet. Arlana is requesting a callback regarding this due to pt currently being out of both meds at this time.

## 2024-09-29 NOTE — Telephone Encounter (Signed)
 Problem resolved

## 2024-09-29 NOTE — Telephone Encounter (Signed)
 Rx has been printed for PCP to sign and given back to be faxed over to pts pharmacy.

## 2024-10-12 ENCOUNTER — Ambulatory Visit: Admitting: Podiatry

## 2024-10-18 ENCOUNTER — Ambulatory Visit

## 2024-10-20 ENCOUNTER — Ambulatory Visit: Admitting: Endocrinology

## 2024-10-25 ENCOUNTER — Ambulatory Visit: Payer: PPO

## 2024-12-01 ENCOUNTER — Ambulatory Visit: Admitting: Internal Medicine

## 2024-12-02 ENCOUNTER — Ambulatory Visit: Admitting: Internal Medicine
